# Patient Record
Sex: Male | Born: 1955
Health system: Southern US, Community
[De-identification: ages and names within clinical notes are randomized; demographics above are authoritative.]

## PROBLEM LIST (undated history)

## (undated) DIAGNOSIS — F32A Depression, unspecified: Secondary | ICD-10-CM

## (undated) DIAGNOSIS — F329 Major depressive disorder, single episode, unspecified: Secondary | ICD-10-CM

## (undated) DIAGNOSIS — I10 Essential (primary) hypertension: Secondary | ICD-10-CM

## (undated) DIAGNOSIS — F419 Anxiety disorder, unspecified: Secondary | ICD-10-CM

## (undated) DIAGNOSIS — J45909 Unspecified asthma, uncomplicated: Secondary | ICD-10-CM

## (undated) HISTORY — DX: Depression, unspecified: F32.A

## (undated) HISTORY — DX: Major depressive disorder, single episode, unspecified: F32.9

## (undated) HISTORY — DX: Anxiety disorder, unspecified: F41.9

---

## 2008-07-31 ENCOUNTER — Emergency Department (HOSPITAL_COMMUNITY): Admission: EM | Admit: 2008-07-31 | Discharge: 2008-07-31 | Payer: Self-pay | Admitting: Emergency Medicine

## 2010-03-05 ENCOUNTER — Emergency Department (HOSPITAL_BASED_OUTPATIENT_CLINIC_OR_DEPARTMENT_OTHER)
Admission: EM | Admit: 2010-03-05 | Discharge: 2010-03-05 | Payer: Self-pay | Source: Home / Self Care | Admitting: Emergency Medicine

## 2010-03-05 ENCOUNTER — Ambulatory Visit: Payer: Self-pay | Admitting: Diagnostic Radiology

## 2010-03-08 ENCOUNTER — Emergency Department (HOSPITAL_BASED_OUTPATIENT_CLINIC_OR_DEPARTMENT_OTHER)
Admission: EM | Admit: 2010-03-08 | Discharge: 2010-03-08 | Payer: Self-pay | Source: Home / Self Care | Admitting: Emergency Medicine

## 2010-03-08 ENCOUNTER — Ambulatory Visit: Payer: Self-pay | Admitting: Diagnostic Radiology

## 2010-06-21 LAB — URINE MICROSCOPIC-ADD ON

## 2010-06-21 LAB — BASIC METABOLIC PANEL
BUN: 23 mg/dL (ref 6–23)
CO2: 24 mEq/L (ref 19–32)
Chloride: 100 mEq/L (ref 96–112)
Glucose, Bld: 131 mg/dL — ABNORMAL HIGH (ref 70–99)
Potassium: 3.9 mEq/L (ref 3.5–5.1)
Sodium: 137 mEq/L (ref 135–145)

## 2010-06-21 LAB — DIFFERENTIAL
Basophils Absolute: 0 10*3/uL (ref 0.0–0.1)
Basophils Relative: 0 % (ref 0–1)
Eosinophils Relative: 1 % (ref 0–5)
Monocytes Absolute: 0.8 10*3/uL (ref 0.1–1.0)
Monocytes Relative: 7 % (ref 3–12)

## 2010-06-21 LAB — URINE CULTURE
Colony Count: NO GROWTH
Culture  Setup Time: 201111270025

## 2010-06-21 LAB — URINALYSIS, ROUTINE W REFLEX MICROSCOPIC
Bilirubin Urine: NEGATIVE
Bilirubin Urine: NEGATIVE
Glucose, UA: NEGATIVE mg/dL
Ketones, ur: 15 mg/dL — AB
Ketones, ur: 15 mg/dL — AB
Leukocytes, UA: NEGATIVE
Nitrite: NEGATIVE
Protein, ur: NEGATIVE mg/dL
Urobilinogen, UA: 0.2 mg/dL (ref 0.0–1.0)
pH: 5.5 (ref 5.0–8.0)

## 2010-06-21 LAB — CBC
HCT: 44.3 % (ref 39.0–52.0)
Hemoglobin: 15.2 g/dL (ref 13.0–17.0)
MCH: 30.6 pg (ref 26.0–34.0)
MCHC: 34.4 g/dL (ref 30.0–36.0)
MCV: 89.1 fL (ref 78.0–100.0)
RDW: 13 % (ref 11.5–15.5)

## 2013-02-13 ENCOUNTER — Ambulatory Visit: Payer: Self-pay | Admitting: Family Medicine

## 2013-02-13 VITALS — BP 130/78 | HR 80 | Temp 98.1°F | Resp 16 | Ht 68.0 in | Wt 177.6 lb

## 2013-02-13 DIAGNOSIS — F3289 Other specified depressive episodes: Secondary | ICD-10-CM

## 2013-02-13 DIAGNOSIS — F329 Major depressive disorder, single episode, unspecified: Secondary | ICD-10-CM

## 2013-02-13 DIAGNOSIS — F32A Depression, unspecified: Secondary | ICD-10-CM

## 2013-02-13 DIAGNOSIS — G4700 Insomnia, unspecified: Secondary | ICD-10-CM

## 2013-02-13 MED ORDER — PAROXETINE HCL 20 MG PO TABS: 20.0000 mg | ORAL_TABLET | Freq: Every day | ORAL | Status: DC

## 2013-02-13 MED ORDER — CLONAZEPAM 1 MG PO TABS: ORAL_TABLET | ORAL | Status: DC
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
## 2013-02-27 ENCOUNTER — Ambulatory Visit: Payer: Self-pay | Admitting: Family Medicine

## 2013-02-27 VITALS — BP 122/78 | HR 82 | Temp 98.5°F | Resp 18 | Ht 67.0 in | Wt 177.2 lb

## 2013-02-27 DIAGNOSIS — D1739 Benign lipomatous neoplasm of skin and subcutaneous tissue of other sites: Secondary | ICD-10-CM

## 2013-02-27 DIAGNOSIS — R221 Localized swelling, mass and lump, neck: Secondary | ICD-10-CM

## 2013-02-27 DIAGNOSIS — F411 Generalized anxiety disorder: Secondary | ICD-10-CM

## 2013-02-27 DIAGNOSIS — D170 Benign lipomatous neoplasm of skin and subcutaneous tissue of head, face and neck: Secondary | ICD-10-CM

## 2013-02-27 DIAGNOSIS — R220 Localized swelling, mass and lump, head: Secondary | ICD-10-CM

## 2013-02-27 NOTE — Patient Instructions (Signed)

## 2013-02-27 NOTE — Progress Notes (Signed)
[address]   [place], [place]  [address]   [phone] Subjective:    Patient ID: Casey Silva, male    DOB: 1955-12-07, 57 y.o.   MRN: [id number]  This chart was scribed for Ethelda Chick, MD by Blanchard Kelch, ED Scribe. The patient was seen in room 4. Patient's care was started at 4:11 PM.   HPI  Casey Silva is a 57 y.o. male who presents to office for a physical exam. He would like to donate plasma but needs his neck examined in order to be able to donate. He has a raised mass on his neck that he states he has had for at least twenty years. He denies that it has enlarged since first noticing it. He states that his mother has a similar cyst on her neck. He denies chest pain, nausea, vomiting, heartburn, suicidal ideations, or homicidal ideations. He has a regular bowel movement everyday.  He denies any chronic health problems other than insomnia. He is currently taking Klonopin and Paxil for his insomnia and mild anxiety. He states that caffeine accelerates his mood so he tries to stay away from it. He specifically denies a past medical history of hypertension, diabetes or hyperlipidemia.   He had asthma as a child but that has subsided. He has had knee surgery when he was a child. He denies any over night hospitalizations. He has an allergy to pollen but denies any allergic reactions to medication.   His mother is 95 and she has a past medical family and extended family has hearing problems. He denies she has heart attack, stroke , diabetes, or cancer. She used to be a smoker. His father passed away due GI problems that led to a colostomy being placed. He caught an infection in the [place] and passed away in 1980/08/24. He had no other health problems. He has two sisters. His older sister is healthy other than being obese. His younger sister is healthy also. He does not have any brothers.   He has been a smoker since he was about 18. He smokes 1-2 cigarettes a day. He hasn't drank  alcohol in almost a year. He denies drug use. He exercises at least once a day. He is currently single. He does not have any children. He was working with Valorie Roosevelt and Elsie Lincoln at [employer] but is currently in between jobs as of last week. He is looking for a new job.   He does not have a PCP currently because he recently moved. He is originally from [place]. He became a [place] citizen in 1970s.  Past Medical History  Diagnosis Date  . Depression   . Anxiety    History reviewed. No pertinent past surgical history. Family History  Problem Relation Age of Onset  . Bipolar disorder Mother    No Known Allergies Current Outpatient Prescriptions on File Prior to Visit  Medication Sig Dispense Refill  . clonazePAM (KLONOPIN) 1 MG tablet May take 1-2 tabs PO nightly prn for insomnia.  60 tablet  2  . PARoxetine (PAXIL) 20 MG tablet Take 1 tablet (20 mg total) by mouth daily.  30 tablet  2   No current facility-administered medications on file prior to visit.   History   Social History  . Marital Status: Single    Spouse Name: N/A    Number of Children: N/A  . Years of Education: N/A   Occupational History  . Not on file.   Social History Main Topics  .  Smoking status: Current Every Day Smoker -- 40 years  . Smokeless tobacco: Not on file  . Alcohol Use: No  . Drug Use: No  . Sexual Activity: Not on file   Other Topics Concern  . Not on file   Social History Narrative   Marital status:  Single      Children: none      Employment:  Unemployment.  Independently wealthy; previous work for [employer].      Tobacco:  1-2 cigarettes per day since age 44.      Alcohol:  None      Drugs:  none      Exercise:  Regularly very      Religion:  Jewish   Family History  Problem Relation Age of Onset  . Bipolar disorder Mother     Review of Systems  Constitutional: Negative for fever.  HENT: Negative for drooling.   Eyes: Negative for discharge.  Respiratory: Negative for  cough.   Cardiovascular: Negative for chest pain and leg swelling.  Gastrointestinal: Negative for vomiting.  Endocrine: Negative for polyuria.  Genitourinary: Negative for hematuria.  Musculoskeletal: Negative for gait problem.  Skin: Negative for rash and wound.  Allergic/Immunologic: Negative for immunocompromised state.  Neurological: Negative for speech difficulty.  Hematological: Negative for adenopathy.  Psychiatric/Behavioral: Positive for sleep disturbance. Negative for suicidal ideas, confusion, self-injury and dysphoric mood. The patient is nervous/anxious.        Objective:   Physical Exam  Nursing note and vitals reviewed. Constitutional: He is oriented to person, place, and time. He appears well-developed and well-nourished.  HENT:  Head: Normocephalic and atraumatic.  Eyes: EOM are normal. No scleral icterus.  Neck: Normal range of motion. No thyromegaly present.  Full range of motion of cervical spine. Left lateral neck has a 2.5 cm by 3cm lipomatous mass that is non tender and moveable.  Cardiovascular: Normal rate and regular rhythm.  Exam reveals no gallop and no friction rub.   No murmur heard. Pulmonary/Chest: Effort normal and breath sounds normal. No respiratory distress. He has no wheezes. He has no rhonchi. He has no rales.  Abdominal: Soft. Bowel sounds are normal.  Musculoskeletal: Normal range of motion.  Full range of motion of lumbar spine and shoulders bilaterally.   Lymphadenopathy:    He has no cervical adenopathy.  Neurological: He is alert and oriented to person, place, and time.  Skin: Skin is warm and dry.       Assessment & Plan:  Mass of left side of neck  Lipoma of neck  Generalized anxiety disorder  1.  Mass of L side of neck: New. Consistent with lipoma.   2.  Lipoma L side of neck:  New to this provider.  Present for years without enlargement or growth.  Reassurance provided to patient; clearance to donate plasma.  Discussed that  only indication for resection would be frequent pain, rapid growth of lipoma.   3. Anxiety: stable on Paxil and Klonopin therapy.    No orders of the defined types were placed in this encounter.   I personally performed the services described in this documentation, which was scribed in my presence.  The recorded information has been reviewed and is accurate.  Nilda Simmer, M.D.  [facility] [address] [place], [place]  [address] [phone] phone [phone] fax

## 2013-05-19 ENCOUNTER — Other Ambulatory Visit: Payer: Self-pay | Admitting: Family Medicine

## 2013-05-19 NOTE — Telephone Encounter (Signed)
Approved Paxil RF for 1 mos w/note needs to go or est PCP for add'l RFs. Dr Marin Comment, do you want to RF clonazepam?

## 2013-05-20 NOTE — Telephone Encounter (Signed)
Called in.

## 2015-05-28 DIAGNOSIS — D1730 Benign lipomatous neoplasm of skin and subcutaneous tissue of unspecified sites: Secondary | ICD-10-CM | POA: Insufficient documentation

## 2015-05-28 DIAGNOSIS — J45909 Unspecified asthma, uncomplicated: Secondary | ICD-10-CM | POA: Insufficient documentation

## 2015-05-28 DIAGNOSIS — J439 Emphysema, unspecified: Secondary | ICD-10-CM | POA: Insufficient documentation

## 2015-05-28 DIAGNOSIS — M542 Cervicalgia: Secondary | ICD-10-CM | POA: Insufficient documentation

## 2015-05-28 DIAGNOSIS — M62838 Other muscle spasm: Secondary | ICD-10-CM | POA: Insufficient documentation

## 2015-05-28 DIAGNOSIS — J302 Other seasonal allergic rhinitis: Secondary | ICD-10-CM | POA: Insufficient documentation

## 2015-05-28 DIAGNOSIS — Z720 Tobacco use: Secondary | ICD-10-CM | POA: Diagnosis present

## 2015-05-28 DIAGNOSIS — F0781 Postconcussional syndrome: Secondary | ICD-10-CM | POA: Insufficient documentation

## 2016-02-21 DIAGNOSIS — Z833 Family history of diabetes mellitus: Secondary | ICD-10-CM | POA: Insufficient documentation

## 2016-02-21 DIAGNOSIS — I10 Essential (primary) hypertension: Secondary | ICD-10-CM | POA: Insufficient documentation

## 2019-09-23 ENCOUNTER — Other Ambulatory Visit: Payer: Self-pay

## 2019-09-23 ENCOUNTER — Emergency Department (HOSPITAL_COMMUNITY)
Admission: EM | Admit: 2019-09-23 | Discharge: 2019-09-24 | Disposition: A | Discharge status: Home / Self Care | Payer: Self-pay | Attending: Emergency Medicine | Admitting: Emergency Medicine

## 2019-09-23 ENCOUNTER — Encounter (HOSPITAL_COMMUNITY): Payer: Self-pay | Admitting: Emergency Medicine

## 2019-09-23 DIAGNOSIS — R45851 Suicidal ideations: Secondary | ICD-10-CM

## 2019-09-23 DIAGNOSIS — F17200 Nicotine dependence, unspecified, uncomplicated: Secondary | ICD-10-CM | POA: Insufficient documentation

## 2019-09-23 DIAGNOSIS — Z20822 Contact with and (suspected) exposure to covid-19: Secondary | ICD-10-CM | POA: Insufficient documentation

## 2019-09-23 DIAGNOSIS — F1290 Cannabis use, unspecified, uncomplicated: Secondary | ICD-10-CM | POA: Insufficient documentation

## 2019-09-23 DIAGNOSIS — R4581 Low self-esteem: Secondary | ICD-10-CM | POA: Insufficient documentation

## 2019-09-23 LAB — COMPREHENSIVE METABOLIC PANEL
ALT: 19 U/L (ref 0–44)
AST: 21 U/L (ref 15–41)
Albumin: 4.6 g/dL (ref 3.5–5.0)
Alkaline Phosphatase: 54 U/L (ref 38–126)
Anion gap: 15 (ref 5–15)
BUN: 14 mg/dL (ref 8–23)
CO2: 22 mmol/L (ref 22–32)
Calcium: 9.2 mg/dL (ref 8.9–10.3)
Chloride: 98 mmol/L (ref 98–111)
Creatinine, Ser: 0.85 mg/dL (ref 0.61–1.24)
GFR calc Af Amer: >60 mL/min (ref >60)
GFR calc non Af Amer: >60 mL/min (ref >60)
Glucose, Bld: 97 mg/dL (ref 70–99)
Potassium: 4.1 mmol/L (ref 3.5–5.1)
Sodium: 135 mmol/L (ref 135–145)
Total Bilirubin: 1.1 mg/dL (ref 0.3–1.2)
Total Protein: 7.9 g/dL (ref 6.5–8.1)

## 2019-09-23 LAB — CBC WITH DIFFERENTIAL/PLATELET
Abs Immature Granulocytes: 0.03 10*3/uL (ref 0.00–0.07)
Basophils Absolute: 0.1 10*3/uL (ref 0.0–0.1)
Basophils Relative: 1 %
Eosinophils Absolute: 0.2 10*3/uL (ref 0.0–0.5)
Eosinophils Relative: 2 %
HCT: 44.3 % (ref 39.0–52.0)
Hemoglobin: 15 g/dL (ref 13.0–17.0)
Immature Granulocytes: 0 %
Lymphocytes Relative: 21 %
Lymphs Abs: 1.6 10*3/uL (ref 0.7–4.0)
MCH: 29.6 pg (ref 26.0–34.0)
MCHC: 33.9 g/dL (ref 30.0–36.0)
MCV: 87.4 fL (ref 80.0–100.0)
Monocytes Absolute: 0.5 10*3/uL (ref 0.1–1.0)
Monocytes Relative: 7 %
Neutro Abs: 5.1 10*3/uL (ref 1.7–7.7)
Neutrophils Relative %: 69 %
Platelets: 335 10*3/uL (ref 150–400)
RBC: 5.07 MIL/uL (ref 4.22–5.81)
RDW: 13.2 % (ref 11.5–15.5)
WBC: 7.4 10*3/uL (ref 4.0–10.5)
nRBC: 0 % (ref 0.0–0.2)

## 2019-09-23 LAB — RAPID URINE DRUG SCREEN, HOSP PERFORMED
Amphetamines: NOT DETECTED
Barbiturates: NOT DETECTED
Benzodiazepines: NOT DETECTED
Cocaine: NOT DETECTED
Opiates: NOT DETECTED
Tetrahydrocannabinol: NOT DETECTED

## 2019-09-23 LAB — ACETAMINOPHEN LEVEL: Acetaminophen (Tylenol), Serum: <10 ug/mL — ABNORMAL LOW (ref 10–30)

## 2019-09-23 LAB — SARS CORONAVIRUS 2 BY RT PCR (HOSPITAL ORDER, PERFORMED IN ~~LOC~~ HOSPITAL LAB): SARS Coronavirus 2: NEGATIVE

## 2019-09-23 LAB — ETHANOL: Alcohol, Ethyl (B): <10 mg/dL (ref <10)

## 2019-09-23 LAB — SALICYLATE LEVEL: Salicylate Lvl: <7 mg/dL — ABNORMAL LOW (ref 7.0–30.0)

## 2019-09-23 NOTE — ED Provider Notes (Signed)
Patient received at signout from [facility].  Please see her note for full HPI.  In short, 64 year old male with a history of anxiety and depression presents from PCPs office for suicidal ideations.  Patient had a plan to overdose on a bottle of hydrocodone pills at home.  He had presented to his PCP and expressed his sentiments, and was thus sent to the ER.  He is actively seeking help and arrives voluntarily.  He endorses overall body pain, but cannot identify specific pain.  He also has some palpitations with anxiety.  Signout received with labs pending and TTS consult pending.  I personally reviewed all of his labs.  CBC without leukocytosis, negative ethanol, salicylate, acetaminophen.  UDS negative.  Negative Covid.  CMP without significant electrode abnormalities, normal BUN/creatinine, normal AST/ALT.  EKG normal sinus rhythm.  Overall, the patient has no other physical complaints.  He has been medically cleared, awaiting TTS consult.   Physical Exam  BP (!) 149/105 (BP Location: Right Arm)   Pulse 71   Temp 98.6 F (37 C) (Oral)   Resp 18   Ht 5\' 7"  (1.702 m)   Wt 80 kg   SpO2 98%   BMI 27.62 kg/m   Physical Exam Vitals and nursing note reviewed.  Constitutional:      Appearance: He is well-developed.  HENT:     Head: Normocephalic and atraumatic.  Eyes:     Conjunctiva/sclera: Conjunctivae normal.  Cardiovascular:     Rate and Rhythm: Normal rate and regular rhythm.     Pulses: Normal pulses.     Heart sounds: Normal heart sounds. No murmur heard.   Pulmonary:     Effort: Pulmonary effort is normal. No respiratory distress.     Breath sounds: Normal breath sounds.  Abdominal:     Palpations: Abdomen is soft.     Tenderness: There is no abdominal tenderness.  Musculoskeletal:        General: No swelling. Normal range of motion.     Cervical back: Normal range of motion and neck supple.  Skin:    General: Skin is warm and dry.     Capillary Refill: Capillary refill  takes less than 2 seconds.     Findings: No erythema or rash.  Neurological:     General: No focal deficit present.     Mental Status: He is alert and oriented to person, place, and time.  Psychiatric:        Attention and Perception: Attention and perception normal.        Mood and Affect: Mood is depressed.        Speech: Speech normal.        Behavior: Behavior normal.        Thought Content: Thought content includes suicidal ideation.     ED Course/Procedures     Results for orders placed or performed during the hospital encounter of 09/23/19  SARS Coronavirus 2 by RT PCR (hospital order, performed in [facility] hospital lab) Nasopharyngeal Nasopharyngeal Swab   Specimen: Nasopharyngeal Swab  Result Value Ref Range   SARS Coronavirus 2 NEGATIVE NEGATIVE  Comprehensive metabolic panel  Result Value Ref Range   Sodium 135 135 - 145 mmol/L   Potassium 4.1 3.5 - 5.1 mmol/L   Chloride 98 98 - 111 mmol/L   CO2 22 22 - 32 mmol/L   Glucose, Bld 97 70 - 99 mg/dL   BUN 14 8 - 23 mg/dL   Creatinine, Ser 0.85 0.61 -  1.24 mg/dL   Calcium 9.2 8.9 - 10.3 mg/dL   Total Protein 7.9 6.5 - 8.1 g/dL   Albumin 4.6 3.5 - 5.0 g/dL   AST 21 15 - 41 U/L   ALT 19 0 - 44 U/L   Alkaline Phosphatase 54 38 - 126 U/L   Total Bilirubin 1.1 0.3 - 1.2 mg/dL   GFR calc non Af Amer >60 >60 mL/min   GFR calc Af Amer >60 >60 mL/min   Anion gap 15 5 - 15  Ethanol  Result Value Ref Range   Alcohol, Ethyl (B) <10 <10 mg/dL  Urine rapid drug screen (hosp performed)  Result Value Ref Range   Opiates NONE DETECTED NONE DETECTED   Cocaine NONE DETECTED NONE DETECTED   Benzodiazepines NONE DETECTED NONE DETECTED   Amphetamines NONE DETECTED NONE DETECTED   Tetrahydrocannabinol NONE DETECTED NONE DETECTED   Barbiturates NONE DETECTED NONE DETECTED  CBC with Diff  Result Value Ref Range   WBC 7.4 4.0 - 10.5 K/uL   RBC 5.07 4.22 - 5.81 MIL/uL   Hemoglobin 15.0 13.0 - 17.0 g/dL   HCT 44.3 39 - 52 %   MCV  87.4 80.0 - 100.0 fL   MCH 29.6 26.0 - 34.0 pg   MCHC 33.9 30.0 - 36.0 g/dL   RDW 13.2 11.5 - 15.5 %   Platelets 335 150 - 400 K/uL   nRBC 0.0 0.0 - 0.2 %   Neutrophils Relative % 69 %   Neutro Abs 5.1 1.7 - 7.7 K/uL   Lymphocytes Relative 21 %   Lymphs Abs 1.6 0.7 - 4.0 K/uL   Monocytes Relative 7 %   Monocytes Absolute 0.5 0 - 1 K/uL   Eosinophils Relative 2 %   Eosinophils Absolute 0.2 0 - 0 K/uL   Basophils Relative 1 %   Basophils Absolute 0.1 0 - 0 K/uL   Immature Granulocytes 0 %   Abs Immature Granulocytes 0.03 0.00 - 0.07 K/uL  Acetaminophen level  Result Value Ref Range   Acetaminophen (Tylenol), Serum <10 (L) 10 - 30 ug/mL  Salicylate level  Result Value Ref Range   Salicylate Lvl <3.8 (L) 7.0 - 30.0 mg/dL   No results found.  Procedures  MDM         Garald Balding, PA-C 09/23/19 2057    Little, Wenda Overland, MD 09/24/19 1039

## 2019-09-23 NOTE — ED Triage Notes (Signed)
Pt brought in by [employer] from PCP office, wants to be medically evaluated and also endorses SI thoughts. States he has felt depressed for several years but it has been getting worse. States he has a bottle of prescription hydrocodone pills and has contemplated taking them. Denies HI thoughts.

## 2019-09-23 NOTE — ED Provider Notes (Signed)
[facility] DEPT Provider Note   CSN: [id number] Arrival date & time: 09/23/19  1615     History Chief Complaint  Patient presents with  . Suicidal    Kavir Savoca is a 64 y.o. male.  Cameron Schwinn is a 64 y.o. male with a history of anxiety and depression, who presents to the ED via [facility] from his PCPs office for evaluation of SI.  Patient reports that he has been struggling with anxiety and depression for many years, for a long time did not seek any medical help and was not on any medication for this.  Recently started seeing a PCP who started him on hydroxyzine.  He went back to his PCP today because his most recent episode of depression has been worsening recently.  He reports having thoughts of suicide, reports he has a bottle of hydrocodone pills at home and he has contemplated taking them in an attempt to kill himself.  He reports that he has constant fears of doing things.  Has had to stop multiple jobs due to worsening depression and anxiety.  He reports having some palpitations intermittently and feeling like his body is just going to shut down to give up on him.  Patient reports that his anxiety often impacts his sleep and he has not been able to get much sleep over the last few days.  He reports that at this point he just wants it all to end and he does not want to feel pain anymore.  He denies specific medical complaints today, but was hoping that when he saw his PCP today that they would send him to the hospital.  He denies any fevers or recent illness.  No other aggravating or alleviating factors.        Past Medical History:  Diagnosis Date  . Anxiety   . Depression     There are no problems to display for this patient.   History reviewed. No pertinent surgical history.     Family History  Problem Relation Age of Onset  . Bipolar disorder Mother     Social History   Tobacco Use  . Smoking status: Current Some Day Smoker    Years:  40.00  . Smokeless tobacco: Never Used  Substance Use Topics  . Alcohol use: No  . Drug use: No    Home Medications Prior to Admission medications   Medication Sig Start Date End Date Taking? Authorizing Provider  albuterol (VENTOLIN HFA) 108 (90 Base) MCG/ACT inhaler Inhale 2 puffs into the lungs every 6 (six) hours as needed for wheezing or shortness of breath.  04/18/19  Yes [provider]  IBU 400 MG tablet Take 400-800 mg by mouth 3 (three) times daily as needed for moderate pain.  08/04/19  Yes [provider]  Misc Natural Products (NF FORMULAS TESTOSTERONE PO) Take 1 tablet by mouth daily.   Yes [provider]  Multiple Vitamin (MULTIVITAMIN ADULT) TABS Take 1 tablet by mouth daily.   Yes [provider]  clonazePAM (KLONOPIN) 1 MG tablet Take 1-2 tablets by mouth nightly as needed. PATIENT NEEDS GO TO OR EST PCP FOR ADDITIONAL REFILLS Patient not taking: Reported on 09/23/2019 05/19/13   Le, Thao P, DO  PARoxetine (PAXIL) 20 MG tablet Take 1 tablet (20 mg total) by mouth daily. PATIENT NEEDS GO TO OR EST PCP FOR ADDITIONAL REFILLS Patient not taking: Reported on 09/23/2019 05/19/13   Rikki Spearing P, DO    Allergies  Dust mite mixed allergen ext [mite (d. farinae)] and Bee pollen  Review of Systems   Review of Systems  Constitutional: Negative for chills and fever.  HENT: Negative.   Respiratory: Negative for cough and shortness of breath.   Cardiovascular: Positive for palpitations. Negative for chest pain.  Gastrointestinal: Negative for abdominal pain, nausea and vomiting.  Genitourinary: Negative for dysuria.  Musculoskeletal: Negative for arthralgias and myalgias.  Neurological: Negative for dizziness, syncope and light-headedness.  Psychiatric/Behavioral: Positive for dysphoric mood, sleep disturbance and suicidal ideas.    Physical Exam Updated Vital Signs BP (!) 149/105 (BP Location: Right Arm)   Pulse 71   Temp 98.6 F (37 C) (Oral)    Resp 18   Ht 5\' 7"  (1.702 m)   Wt 80 kg   SpO2 98%   BMI 27.62 kg/m   Physical Exam Vitals and nursing note reviewed.  Constitutional:      General: He is not in acute distress.    Appearance: Normal appearance. He is well-developed. He is not ill-appearing or diaphoretic.  HENT:     Head: Normocephalic and atraumatic.  Eyes:     General:        Right eye: No discharge.        Left eye: No discharge.  Cardiovascular:     Rate and Rhythm: Normal rate and regular rhythm.     Heart sounds: Normal heart sounds. No murmur heard.  No friction rub. No gallop.   Pulmonary:     Effort: Pulmonary effort is normal. No respiratory distress.     Breath sounds: Normal breath sounds. No wheezing or rales.     Comments: Respirations equal and unlabored, patient able to speak in full sentences, lungs clear to auscultation bilaterally Abdominal:     General: Bowel sounds are normal. There is no distension.     Palpations: Abdomen is soft. There is no mass.     Tenderness: There is no abdominal tenderness. There is no guarding.     Comments: Abdomen soft, nondistended, nontender to palpation in all quadrants without guarding or peritoneal signs  Musculoskeletal:        General: No deformity.     Cervical back: Neck supple.  Skin:    General: Skin is warm and dry.     Capillary Refill: Capillary refill takes less than 2 seconds.  Neurological:     Mental Status: He is alert.     Coordination: Coordination normal.     Comments: Speech is clear, able to follow commands Moves extremities without ataxia, coordination intact  Psychiatric:        Attention and Perception: He does not perceive auditory or visual hallucinations.        Mood and Affect: Mood is depressed.        Speech: Speech normal.        Behavior: Behavior normal. Behavior is cooperative.        Thought Content: Thought content includes suicidal ideation. Thought content does not include homicidal ideation. Thought content  includes suicidal plan.     ED Results / Procedures / Treatments   Labs (all labs ordered are listed, but only abnormal results are displayed) Labs Reviewed  SARS CORONAVIRUS 2 BY RT PCR (HOSPITAL ORDER, [facility])  COMPREHENSIVE METABOLIC PANEL  CBC WITH DIFFERENTIAL/PLATELET  ETHANOL  RAPID URINE DRUG SCREEN, HOSP PERFORMED  ACETAMINOPHEN LEVEL  SALICYLATE LEVEL    EKG None  Radiology No results found.  Procedures Procedures (  including critical care time)  Medications Ordered in ED Medications - No data to display  ED Course  I have reviewed the triage vital signs and the nursing notes.  Pertinent labs & imaging results that were available during my care of the patient were reviewed by me and considered in my medical decision making (see chart for details).    MDM Rules/Calculators/A&P                          64 year old male sent from PCPs office for suicidal ideations, has dealt with depression for many years, has not had previous psychiatric hospitalization.  Arrives today with plan to take a bottle of hydrocodone that he has at home to kill himself.  He reports that he has given up and is just tired of the pain at this time.  He denies focal medical complaint, no recent fevers or illness, he reports some intermittent palpitations associated with his anxiety, currently denies this, no chest pain, shortness of breath or abdominal pain.  Will get screening labs and EKG in place TTS consult.  At shift change care signed out to Porter who will follow up on patient's lab work, anticipate he will be medically cleared, and will follow up on TTS recommendations.  Final Clinical Impression(s) / ED Diagnoses Final diagnoses:  Suicidal ideation    Rx / DC Orders ED Discharge Orders    None       Jacqlyn Larsen, Hershal Coria 09/23/19 [place], [place], DO 09/24/19 0125

## 2019-09-24 ENCOUNTER — Ambulatory Visit (HOSPITAL_COMMUNITY)
Admit: 2019-09-24 | Discharge: 2019-09-24 | Disposition: A | Discharge status: Home / Self Care | Payer: Self-pay | Attending: Psychiatry | Admitting: Psychiatry

## 2019-09-24 ENCOUNTER — Inpatient Hospital Stay (HOSPITAL_COMMUNITY)
Admission: EM | Admit: 2019-09-24 | Discharge: 2019-09-26 | DRG: 885 | Disposition: A | Discharge status: Home / Self Care | Payer: Self-pay | Source: Intra-hospital | Attending: Psychiatry | Admitting: Psychiatry

## 2019-09-24 ENCOUNTER — Encounter (HOSPITAL_COMMUNITY): Payer: Self-pay | Admitting: Psychiatry

## 2019-09-24 DIAGNOSIS — S0990XA Unspecified injury of head, initial encounter: Secondary | ICD-10-CM | POA: Insufficient documentation

## 2019-09-24 DIAGNOSIS — R440 Auditory hallucinations: Secondary | ICD-10-CM | POA: Insufficient documentation

## 2019-09-24 DIAGNOSIS — F329 Major depressive disorder, single episode, unspecified: Secondary | ICD-10-CM | POA: Diagnosis present

## 2019-09-24 DIAGNOSIS — F419 Anxiety disorder, unspecified: Secondary | ICD-10-CM | POA: Diagnosis present

## 2019-09-24 DIAGNOSIS — Z20822 Contact with and (suspected) exposure to covid-19: Secondary | ICD-10-CM | POA: Diagnosis present

## 2019-09-24 DIAGNOSIS — R45851 Suicidal ideations: Secondary | ICD-10-CM | POA: Diagnosis present

## 2019-09-24 DIAGNOSIS — F29 Unspecified psychosis not due to a substance or known physiological condition: Secondary | ICD-10-CM | POA: Diagnosis present

## 2019-09-24 DIAGNOSIS — Z818 Family history of other mental and behavioral disorders: Secondary | ICD-10-CM

## 2019-09-24 DIAGNOSIS — R441 Visual hallucinations: Secondary | ICD-10-CM | POA: Insufficient documentation

## 2019-09-24 DIAGNOSIS — Z91030 Bee allergy status: Secondary | ICD-10-CM

## 2019-09-24 DIAGNOSIS — Z91048 Other nonmedicinal substance allergy status: Secondary | ICD-10-CM

## 2019-09-24 DIAGNOSIS — F17210 Nicotine dependence, cigarettes, uncomplicated: Secondary | ICD-10-CM | POA: Diagnosis present

## 2019-09-24 DIAGNOSIS — F23 Brief psychotic disorder: Principal | ICD-10-CM

## 2019-09-24 DIAGNOSIS — W19XXXA Unspecified fall, initial encounter: Secondary | ICD-10-CM | POA: Insufficient documentation

## 2019-09-24 DIAGNOSIS — G4700 Insomnia, unspecified: Secondary | ICD-10-CM | POA: Diagnosis present

## 2019-09-24 LAB — TSH: TSH: 1.267 u[IU]/mL (ref 0.350–4.500)

## 2019-09-24 MED ORDER — ALBUTEROL SULFATE HFA 108 (90 BASE) MCG/ACT IN AERS: 1.0000 | INHALATION_SPRAY | Freq: Four times a day (QID) | RESPIRATORY_TRACT | Status: DC | PRN

## 2019-09-24 MED ORDER — ALUM & MAG HYDROXIDE-SIMETH 200-200-20 MG/5ML PO SUSP: 30.0000 mL | ORAL | Status: DC | PRN

## 2019-09-24 MED ORDER — LISINOPRIL 10 MG PO TABS
10.0000 mg | ORAL_TABLET | Freq: Every day | ORAL | Status: DC
  Administered 2019-09-24 – 2019-09-25 (×2): 10 mg via ORAL
  Filled 2019-09-24: qty 2
  Filled 2019-09-24: qty 7
  Filled 2019-09-24: qty 1
  Filled 2019-09-24: qty 2
  Filled 2019-09-24 (×3): qty 1

## 2019-09-24 MED ORDER — TRAZODONE HCL 50 MG PO TABS
50.0000 mg | ORAL_TABLET | Freq: Every evening | ORAL | Status: DC | PRN
  Administered 2019-09-24 – 2019-09-25 (×2): 50 mg via ORAL
  Filled 2019-09-24: qty 1
  Filled 2019-09-24: qty 7
  Filled 2019-09-24: qty 1

## 2019-09-24 MED ORDER — ASPIRIN EC 81 MG PO TBEC
81.0000 mg | DELAYED_RELEASE_TABLET | Freq: Every day | ORAL | Status: DC
  Administered 2019-09-24 – 2019-09-26 (×3): 81 mg via ORAL
  Filled 2019-09-24: qty 7
  Filled 2019-09-24 (×6): qty 1

## 2019-09-24 MED ORDER — MAGNESIUM HYDROXIDE 400 MG/5ML PO SUSP: 30.0000 mL | Freq: Every day | ORAL | Status: DC | PRN

## 2019-09-24 MED ORDER — RISPERIDONE 2 MG PO TABS
2.0000 mg | ORAL_TABLET | Freq: Every day | ORAL | Status: DC
  Administered 2019-09-24 – 2019-09-25 (×2): 2 mg via ORAL
  Filled 2019-09-24 (×3): qty 1

## 2019-09-24 MED ORDER — SERTRALINE HCL 25 MG PO TABS
25.0000 mg | ORAL_TABLET | Freq: Every day | ORAL | Status: DC
  Administered 2019-09-24 – 2019-09-26 (×3): 25 mg via ORAL
  Filled 2019-09-24 (×5): qty 1
  Filled 2019-09-24: qty 7

## 2019-09-24 MED ORDER — ENSURE ENLIVE PO LIQD
237.0000 mL | Freq: Two times a day (BID) | ORAL | Status: DC
  Administered 2019-09-24: 237 mL via ORAL

## 2019-09-24 MED ORDER — RISPERIDONE 0.5 MG PO TABS
0.5000 mg | ORAL_TABLET | Freq: Every day | ORAL | Status: DC
  Administered 2019-09-24 – 2019-09-26 (×3): 0.5 mg via ORAL
  Filled 2019-09-24 (×3): qty 1
  Filled 2019-09-24: qty 7
  Filled 2019-09-24 (×2): qty 1

## 2019-09-24 MED ORDER — HYDROXYZINE HCL 25 MG PO TABS: 25.0000 mg | ORAL_TABLET | Freq: Three times a day (TID) | ORAL | Status: DC | PRN

## 2019-09-24 MED ORDER — ACETAMINOPHEN 325 MG PO TABS: 650.0000 mg | ORAL_TABLET | Freq: Four times a day (QID) | ORAL | Status: DC | PRN

## 2019-09-24 NOTE — Progress Notes (Signed)
   09/24/19 0740  Vital Signs  Temp 98.5 F (36.9 C)  Temp Source Oral  Pulse Rate 67  Pulse Rate Source Dinamap  Resp 18  BP (!) 137/93  BP Location Left Arm  BP Method Automatic  Patient Position (if appropriate) Sitting  Oxygen Therapy  SpO2 100 %  O2 Device Room Air  Pain Assessment  Pain Scale 0-10  Pain Score 9  Pain Type Acute pain  Pain Location Head  Pain Descriptors / Indicators Jabbing ("knife like")  Pain Frequency Intermittent  Pain Intervention(s) RN made aware  Height and Weight  Height 5\' 10"  (1.778 m)  Weight 79.8 kg  BSA (Calculated - sq m) 1.99 sq meters  BMI (Calculated) 25.25  Weight in (lb) to have BMI = 25 173.9   Admission note: D:  Patient is a 64 year old  Caucasian male brought in for SI. Patient reported increase anxiety and thoughts to OD on hydrocodone.Patinet stated "I fell like I would be better off dead."  Patient had lived in a "big old boarding house." in [place] but does not want to return there because it's lonely, bedbugs and cockroaches. Patient reports that he was born in [place] and moved to [place] when he as a kid. Patient reported that he was married now divorced.  Patient reports that he has had multiple jobs but quits too soon and is full of regret and guilt. Patient's most current job was at a [employer], but quit because he "...slowly saw himself going down.." Patient reports that he had a "seizure"/ blackout  on 09/20/19 where he fell and does not know how long he was out. Patient reported multiple somatic complaints.Patient stated"..my head feels like something cold wrapped around my head and I blacked out." Patient stated "...my bones are floating over here by my shoulder." and " ... the pain in my head feels like a knife is going through it."  Patient reported feeling regret that he had his teeth pulled out for some dentures. Patient reported feeling "overwhelmed" "guilty" and stated that he thinks he has  "delusions of grandeur" because he "...can get wealthy..I really believe that from the lottery." Patient reported that "Watching TV is sending me messages. Patient contracts for safety.   Patient stated that the only contact he has here was Derryl Harbor from [place] and encouragement provided Routine safety checks conducted every 15 minutes. Patient  Informed to notify staff with any concerns.   R: Safety maintained.

## 2019-09-24 NOTE — ED Notes (Signed)
Telepsych initiated at bedside

## 2019-09-24 NOTE — ED Notes (Addendum)
Safe transport contacted to transport patient

## 2019-09-24 NOTE — Tx Team (Signed)
Initial Treatment Plan 09/24/2019 10:42 AM [name] RPZ:[id number]    PATIENT STRESSORS: Financial difficulties Health problems   PATIENT STRENGTHS: Average or above average intelligence Communication skills   PATIENT IDENTIFIED PROBLEMS: depression  anxiety  Fear of being alone  lonliness  SI             DISCHARGE CRITERIA:  Ability to meet basic life and health needs Adequate post-discharge living arrangements Improved stabilization in mood, thinking, and/or behavior  PRELIMINARY DISCHARGE PLAN: Attend aftercare/continuing care group Outpatient therapy Placement in alternative living arrangements  PATIENT/FAMILY INVOLVEMENT: This treatment plan has been presented to and reviewed with the patient, Casey Silva. The patient  has been given the opportunity to ask questions and make suggestions.  Roxanna Mew, RN 09/24/2019, 10:42 AM

## 2019-09-24 NOTE — BHH Suicide Risk Assessment (Signed)
[facility] Admission Suicide Risk Assessment   Nursing information obtained from:  Patient Demographic factors:  Male, Low socioeconomic status, Living alone, Unemployed Current Mental Status:  Self-harm thoughts, Suicidal ideation indicated by patient Loss Factors:  Decline in physical health, Decrease in vocational status, Financial problems / change in socioeconomic status Historical Factors:  Impulsivity Risk Reduction Factors:  NA  Total Time spent with patient: 45 minutes Principal Problem: <principal problem not specified> Diagnosis:  Active Problems:   Psychosis ([place])  Subjective Data: Patient is seen and examined.  Patient is a 64 year old male with a reported past psychiatric history of anxiety, depression and insomnia who presented to the [facility] emergency department on 09/24/2019 with suicidal ideation.  The patient stated that he has had a multiyear history of problems.  He stated that he has been anxious, impulsive, and having severe insomnia for many years.  He went into gross detail about jobs that he had that were successful and that he would leave abruptly.  He discussed his repeated cycles of insomnia.  He stated that he had been placed on medicines like clonazepam and paroxetine in the past for anxiety and sleep issues.  He never remain on these medications long-term.  He denied any previous psychiatric evaluations, psychiatric hospitalizations or psychiatric treatment from a psychiatrist.  He stated that he had a family history of bipolar disorder.  He stated his mother was bipolar.  During the interview he used several words of jargon to describe his symptoms, and was at times very difficult to get him to put them into his own words.  He stated that most recently that he had had several days of worsening insomnia.  He stated that his insomnia had gotten to the point that he would "hear music playing", and as well see shadows and other figures.  He attributed his  worsening depression and anxiety to his current living situation.  He stated he had been in a boardinghouse for several years, and he admitted that the other cohabitant's were not of the highest quality of people.  He also mentions seeing roaches and ants in the house, and it is unclear whether or not this was true, or whether this may have been related to psychosis.  He has had multiple jobs over the years.  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  He had conflict with one of his coworkers over the coworker wanting a more significant relationship.  He also felt as though that the staff been at home watched inappropriate movies containing violence and offensive language.  He ended up leaving that job on 24 May.  He denied any drugs or alcohol.  He denied any previous treatment for symptoms as this.  He stated this was the first time that he had ever gotten so bad that he heard things or saw things.  He denied any episodes of euphoria or excessive spending.  He did admit to impulsivity and depressive episodes.  Primarily his major issue was anxiety.  He was very somatic during the interview.  I discussed headache pain, tooth problems, "my gastrointestinal system shutting down", decreased appetite.  He was admitted to the hospital for evaluation and stabilization.  Continued Clinical Symptoms:  Alcohol Use Disorder Identification Test Final Score (AUDIT): 0 The "Alcohol Use Disorders Identification Test", Guidelines for Use in Primary Care, Second Edition.  World Pharmacologist [facility]). Score between 0-7:  no or low risk or alcohol related problems. Score between 8-15:  moderate risk of alcohol related problems. Score between 16-19:  high risk of alcohol related problems. Score 20 or above:  warrants further diagnostic evaluation for alcohol dependence and treatment.   CLINICAL FACTORS:   Severe Anxiety and/or Agitation Depression:    Anhedonia Delusional Hopelessness Insomnia Currently Psychotic   Musculoskeletal: Strength & Muscle Tone: within normal limits Gait & Station: normal Patient leans: N/A  Psychiatric Specialty Exam: Physical Exam  Nursing note and vitals reviewed. Constitutional: He is oriented to person, place, and time.  HENT:  Head: Normocephalic and atraumatic.  Respiratory: Effort normal.  GI: Normal appearance.  Neurological: He is alert and oriented to person, place, and time.    Review of Systems  Blood pressure (!) 137/93, pulse 67, temperature 98.5 F (36.9 C), temperature source Oral, resp. rate 18, height 5\' 10"  (1.778 m), weight 79.8 kg, SpO2 100 %.Body mass index is 25.25 kg/m.  General Appearance: Casual  Eye Contact:  Minimal  Speech:  Normal Rate  Volume:  Normal  Mood:  Anxious, Depressed and Dysphoric  Affect:  Congruent  Thought Process:  Coherent and Descriptions of Associations: Intact  Orientation:  Full (Time, Place, and Person)  Thought Content:  Hallucinations: Auditory Visual, Obsessions and Rumination  Suicidal Thoughts:  Yes.  without intent/plan  Homicidal Thoughts:  No  Memory:  Immediate;   Poor Recent;   Poor Remote;   Poor  Judgement:  Impaired  Insight:  Fair  Psychomotor Activity:  Increased  Concentration:  Concentration: Fair and Attention Span: Fair  Recall:  [employer] of Knowledge:  Good  Language:  Good  Akathisia:  Negative  Handed:  Right  AIMS (if indicated):     Assets:  Desire for Improvement Resilience  ADL's:  Intact  Cognition:  WNL  Sleep:         COGNITIVE FEATURES THAT CONTRIBUTE TO RISK:  Polarized thinking    SUICIDE RISK:   Moderate:  Frequent suicidal ideation with limited intensity, and duration, some specificity in terms of plans, no associated intent, good self-control, limited dysphoria/symptomatology, some risk factors present, and identifiable protective factors, including available and accessible social  support.  PLAN OF CARE: Patient is seen and examined.  Patient is a 64 year old male with the above-stated past psychiatric history who presented to the [facility] emergency department with suicidal ideation.  He has symptoms of depression, anxiety, almost obsessive compulsive thinking.  He is very somatic, and mild psychotic symptoms.  He will be admitted to the hospital.  He will be integrated in the milieu.  He will be encouraged to attend groups.  It is unclear what the full diagnosis is.  Clearly he is depressed, clearly he is anxious.  He has components of OCD, but does not fulfill criteria for bipolar disorder.  He has not had any euphoric episodes described.  His psychotic symptoms may be related to his insomnia and perhaps psychotic depression.  We will go on and start him on Risperdal 0.5 mg p.o. daily and 2 mg p.o. nightly for psychosis, mood stability and sleep.  He will also be started on Zoloft 25 mg p.o. daily for depression, anxiety and possible obsessive-compulsive disorder.  I do not think he fulfills criteria extensively for OCD, but he is somatic and may fulfill criteria for somatization disorder.  Review of his admission laboratories revealed essentially normal electrolytes, normal CBC, normal differential.  Acetaminophen was less than 10, salicylate was less than 7.  Blood alcohol was less than  10.  Drug screen was negative.  His EKG showed a normal sinus rhythm with a QTc interval of 422.  TSH was not ordered, but that will be placed now.  He will also have available trazodone 50 mg p.o. nightly as needed insomnia as well as hydroxyzine 25 mg p.o. every 6 hours as needed anxiety.  I certify that inpatient services furnished can reasonably be expected to improve the patient's condition.   Sharma Covert, MD 09/24/2019, 11:16 AM

## 2019-09-24 NOTE — ED Notes (Signed)
Safe transport arrived for patient. Patient escorted out to [facility] by staff.

## 2019-09-24 NOTE — Tx Team (Signed)
Interdisciplinary Treatment and Diagnostic Plan Update  09/24/2019 Time of Session: 9:00am Casey Silva MRN: [id number]  Principal Diagnosis: <principal problem not specified>  Secondary Diagnoses: Active Problems:   Psychosis ([place])   Current Medications:  Current Facility-Administered Medications  Medication Dose Route Frequency Provider Last Rate Last Admin  . acetaminophen (TYLENOL) tablet 650 mg  650 mg Oral Q6H PRN Sharma Covert, MD      . albuterol (VENTOLIN HFA) 108 (90 Base) MCG/ACT inhaler 1-2 puff  1-2 puff Inhalation Q6H PRN Sharma Covert, MD      . alum & mag hydroxide-simeth (MAALOX/MYLANTA) 200-200-20 MG/5ML suspension 30 mL  30 mL Oral Q4H PRN Sharma Covert, MD      . aspirin EC tablet 81 mg  81 mg Oral Daily Sharma Covert, MD      . hydrOXYzine (ATARAX/VISTARIL) tablet 25 mg  25 mg Oral TID PRN Sharma Covert, MD      . lisinopril (ZESTRIL) tablet 10 mg  10 mg Oral Daily Sharma Covert, MD      . magnesium hydroxide (MILK OF MAGNESIA) suspension 30 mL  30 mL Oral Daily PRN Sharma Covert, MD      . traZODone (DESYREL) tablet 50 mg  50 mg Oral QHS PRN Sharma Covert, MD       PTA Medications: Medications Prior to Admission  Medication Sig Dispense Refill Last Dose  . albuterol (VENTOLIN HFA) 108 (90 Base) MCG/ACT inhaler Inhale 2 puffs into the lungs every 6 (six) hours as needed for wheezing or shortness of breath.      . clonazePAM (KLONOPIN) 1 MG tablet Take 1-2 tablets by mouth nightly as needed. PATIENT NEEDS GO TO OR EST PCP FOR ADDITIONAL REFILLS (Patient not taking: Reported on 09/23/2019) 60 tablet 0   . IBU 400 MG tablet Take 400-800 mg by mouth 3 (three) times daily as needed for moderate pain.      . Misc Natural Products (NF FORMULAS TESTOSTERONE PO) Take 1 tablet by mouth daily.     . Multiple Vitamin (MULTIVITAMIN ADULT) TABS Take 1 tablet by mouth daily.     Marland Kitchen PARoxetine (PAXIL) 20 MG tablet Take 1 tablet (20 mg total) by  mouth daily. PATIENT NEEDS GO TO OR EST PCP FOR ADDITIONAL REFILLS (Patient not taking: Reported on 09/23/2019) 30 tablet 0     Patient Stressors:    Patient Strengths:    Treatment Modalities: Medication Management, Group therapy, Case management,  1 to 1 session with clinician, Psychoeducation, Recreational therapy.   Physician Treatment Plan for Primary Diagnosis: <principal problem not specified> Long Term Goal(s):     Short Term Goals:    Medication Management: Evaluate patient's response, side effects, and tolerance of medication regimen.  Therapeutic Interventions: 1 to 1 sessions, Unit Group sessions and Medication administration.  Evaluation of Outcomes: Not Met  Physician Treatment Plan for Secondary Diagnosis: Active Problems:   Psychosis ([place])  Long Term Goal(s):     Short Term Goals:       Medication Management: Evaluate patient's response, side effects, and tolerance of medication regimen.  Therapeutic Interventions: 1 to 1 sessions, Unit Group sessions and Medication administration.  Evaluation of Outcomes: Not Met   RN Treatment Plan for Primary Diagnosis: <principal problem not specified> Long Term Goal(s): Knowledge of disease and therapeutic regimen to maintain health will improve  Short Term Goals: Ability to verbalize feelings will improve and Ability to disclose and discuss suicidal ideas  Medication  Management: RN will administer medications as ordered by provider, will assess and evaluate patient's response and provide education to patient for prescribed medication. RN will report any adverse and/or side effects to prescribing provider.  Therapeutic Interventions: 1 on 1 counseling sessions, Psychoeducation, Medication administration, Evaluate responses to treatment, Monitor vital signs and CBGs as ordered, Perform/monitor CIWA, COWS, AIMS and Fall Risk screenings as ordered, Perform wound care treatments as ordered.  Evaluation of Outcomes: Not  Met   LCSW Treatment Plan for Primary Diagnosis: <principal problem not specified> Long Term Goal(s): Safe transition to appropriate next level of care at discharge, Engage patient in therapeutic group addressing interpersonal concerns.  Short Term Goals: Engage patient in aftercare planning with referrals and resources, Increase social support, Increase emotional regulation, Identify triggers associated with mental health/substance abuse issues and Increase skills for wellness and recovery  Therapeutic Interventions: Assess for all discharge needs, 1 to 1 time with Social worker, Explore available resources and support systems, Assess for adequacy in community support network, Educate family and significant other(s) on suicide prevention, Complete Psychosocial Assessment, Interpersonal group therapy.  Evaluation of Outcomes: Not Met  Progress in Treatment: Attending groups: No. New to unit. Participating in groups: No. Taking medication as prescribed: Yes. Toleration medication: Yes. Family/Significant other contact made: No, will contact:  supports if consents are granted. Patient understands diagnosis: Yes. Discussing patient identified problems/goals with staff: Yes. Medical problems stabilized or resolved: Yes. Denies suicidal/homicidal ideation: No. Passive SI Issues/concerns per patient self-inventory: Yes.  New problem(s) identified: Yes, Describe:  isolation, housing concerns  New Short Term/Long Term Goal(s): medication management for mood stabilization; elimination of SI thoughts; development of comprehensive mental wellness/sobriety plan.  Patient Goals: "Try to normalize my state of mind."  Discharge Plan or Barriers: CSW assessing for appropriate referrals.  Reason for Continuation of Hospitalization: Anxiety Depression Hallucinations Medication stabilization Suicidal ideation  Estimated Length of Stay: 5-7 days  Attendees: Patient: Casey Silva 09/24/2019 10:17  AM  Physician: Dr. Mallie Darting 09/24/2019 10:17 AM  Nursing:  09/24/2019 10:17 AM  RN Care Manager: 09/24/2019 10:17 AM  Social Worker: Stephanie Acre, [place] 09/24/2019 10:17 AM  Recreational Therapist:  09/24/2019 10:17 AM  Other:  09/24/2019 10:17 AM  Other:  09/24/2019 10:17 AM  Other: 09/24/2019 10:17 AM    Scribe for Treatment Team: Joellen [place], [place] 09/24/2019 10:17 AM

## 2019-09-24 NOTE — BH Assessment (Addendum)
Comprehensive Clinical Assessment (CCA) Note  09/24/2019 [name] [id number]  Visit Diagnosis:      ICD-10-CM   1. Suicidal ideation  R45.851   Per EDP, "  Casey Silva is a 64 y.o. male with a history of anxiety and depression, who presents to the ED via [facility] from his PCPs office for evaluation of SI.  Patient reports that he has been struggling with anxiety and depression for many years, for a long time did not seek any medical help and was not on any medication for this.  Recently started seeing a PCP who started him on hydroxyzine.  He went back to his PCP today because his most recent episode of depression has been worsening recently.  He reports having thoughts of suicide, reports he has a bottle of hydrocodone pills at home and he has contemplated taking them in an attempt to kill himself.  He reports that he has constant fears of doing things.  Has had to stop multiple jobs due to worsening depression and anxiety.  He reports having some palpitations intermittently and feeling like his body is just going to shut down to give up on him.  Patient reports that his anxiety often impacts his sleep and he has not been able to get much sleep over the last few days.  He reports that at this point he just wants it all to end and he does not want to feel pain anymore.  He denies specific medical complaints today, but was hoping that when he saw his PCP today that they would send him to the hospital.    During TTS assessment pt endorsed current SI thoughts with plan to overdose. Pt denied HI and SIB but did state he has been experiencing AVH due to not getting any sleep last few days. Pt reports he has dealt with severe depression and anxiety last 1 month or so. Pt also reports poor appetite and poor digestive system. Pt denies any drug use. Pt endorses all symptoms of depression: hopelessness, worthlessness, anxiety, insomnia, isolation, guilt, tearfulness, irritability. Pt reports panic attack this past  weekend due to stress. Pt reports he has been struggling with various medical issues, currently unemployed, financial issues and has no support. Pt currently has no provider and not taking any psych meds, does have PCP and is taking Hydr oxine states he does not see difference with meds. Pt states he wants help wants to seek therapy med/management and inpatient treatment, feels that depression and anxiety is spiraling out of control he is mentally/physically impacted and wants help with poor sleep. Pt was oriented, logical speech, did not present to be responding to internal stimuli or delusional content   Disposition: Adaku, Anike, FNP recommends pt for inpatient treatment. Per [facility] Kim B pt accepted to [facility].   Diagnosis: MDD, single episode, severe          GAD    CCA Screening, Triage and Referral (STR)  Patient Reported Information How did you hear about [place]? Primary Care  Referral name: PCP doctor  Referral phone number: No data recorded  Whom do you see for routine medical problems? Primary Care  Practice/Facility Name: [place]  Practice/Facility Phone Number: No data recorded Name of Contact: No data recorded Contact Number: No data recorded Contact Fax Number: No data recorded Prescriber Name: No data recorded Prescriber Address (if known): No data recorded  What Is the Reason for Your Visit/Call Today? suicidal ideation/depression/anxiety  How Long Has This Been  Causing You Problems? 1-6 months  What Do You Feel Would Help You the Most Today? Therapy;Medication;Other (Comment)   Have You Recently Been in Any Inpatient Treatment (Hospital/Detox/Crisis Center/28-Day Program)? No  Name/Location of Program/Hospital:No data recorded How Long Were You There? No data recorded When Were You Discharged? No data recorded  Have You Ever Received Services From [facility] Before? Yes  Who Do You See at [facility]? medical doctor   Have You  Recently Had Any Thoughts About [facility]? Yes  Are You Planning to Commit Suicide/Harm Yourself At This time? No   Have you Recently Had Thoughts About [facility]? No  Explanation: No data recorded  Have You Used Any Alcohol or Drugs in the Past 24 Hours? No  How Long Ago Did You Use Drugs or Alcohol? No data recorded What Did You Use and How Much? No data recorded  Do You Currently Have a Therapist/Psychiatrist? No  Name of Therapist/Psychiatrist: No data recorded  Have You Been Recently Discharged From Any Office Practice or Programs? No  Explanation of Discharge From Practice/Program: No data recorded    CCA Screening Triage Referral Assessment Type of Contact: Tele-Assessment  Is this Initial or Reassessment? Initial Assessment  Date Telepsych consult ordered in CHL:  09/23/19  Time Telepsych consult ordered in CHL:  No data recorded  Patient Reported Information Reviewed? Yes  Patient Left Without Being Seen? No data recorded Reason for Not Completing Assessment: No data recorded  Collateral Involvement: none   Does Patient Have a [place]? No data recorded Name and Contact of Legal Guardian: No data recorded If Minor and Not Living with Parent(s), Who has Custody? No data recorded Is CPS involved or ever been involved? Never  Is APS involved or ever been involved? Never   Patient Determined To Be At Risk for Harm To Self or Others Based on Review of Patient Reported Information or Presenting Complaint? No  Method: No data recorded Availability of Means: No data recorded Intent: No data recorded Notification Required: No data recorded Additional Information for Danger to Others Potential: No data recorded Additional Comments for Danger to Others Potential: No data recorded Are There Guns or Other Weapons in Your Home? No data recorded Types of Guns/Weapons: No data recorded Are These Weapons Safely Secured?                             No data recorded Who Could Verify You Are Able To Have These Secured: No data recorded Do You Have any Outstanding Charges, Pending Court Dates, Parole/Probation? No data recorded Contacted To Inform of Risk of Harm To Self or Others: No data recorded  [facility]: [facility]   Does Patient Present under Involuntary Commitment? No  IVC Papers Initial File Date: No data recorded  [place] of Residence: [place]   Patient Currently Receiving the Following Services: Not Receiving Services   Determination of Need: No data recorded  Options For Referral: Inpatient Hospitalization     CCA Biopsychosocial  Intake/Chief Complaint:  CCA Intake With Chief Complaint Chief Complaint/Presenting Problem: Suicidal ideation Patient's Currently Reported Symptoms/Problems: anxiety/depression Type of Services Patient Feels Are Needed: counseling/medications  Mental Health Symptoms Depression:  Depression: Difficulty Concentrating, Fatigue, Hopelessness, Increase/decrease in appetite, Change in energy/activity, Worthlessness, Duration of symptoms greater than two weeks, Sleep (too much or little), Tearfulness  Mania:  Mania: None  Anxiety:   Anxiety: Restlessness, Difficulty concentrating, Fatigue,  Sleep, Worrying, Tension  Psychosis:  Psychosis: None  Trauma:  Trauma: None  Obsessions:  Obsessions: None  Compulsions:  Compulsions: None  Inattention:  Inattention: Loses things, Forgetful  Hyperactivity/Impulsivity:  Hyperactivity/Impulsivity: N/A  Oppositional/Defiant Behaviors:  Oppositional/Defiant Behaviors: None  Emotional Irregularity:  Emotional Irregularity: None  Other Mood/Personality Symptoms:      Mental Status Exam Appearance and self-care  Stature:  Stature: Tall  Weight:  Weight: Average weight  Clothing:  Clothing: Casual  Grooming:  Grooming: Normal  Cosmetic use:  Cosmetic Use: Age appropriate  Posture/gait:  Posture/Gait: Normal  Motor  activity:  Motor Activity: Restless  Sensorium  Attention:  Attention: Normal  Concentration:  Concentration: Anxiety interferes  Orientation:  Orientation: Object, Person, Place, Situation, Time, X5  Recall/memory:  Recall/Memory: Normal  Affect and Mood  Affect:  Affect: Appropriate, Depressed, Anxious  Mood:  Mood: Depressed, Anxious, Other (Comment)  Relating  Eye contact:  Eye Contact: Normal  Facial expression:  Facial Expression: Depressed  Attitude toward examiner:  Attitude Toward Examiner: Cooperative  Thought and Language  Speech flow: Speech Flow: Clear and Coherent  Thought content:  Thought Content: Appropriate to Mood and Circumstances (NA)  Preoccupation:  Preoccupations: None  Hallucinations:  Hallucinations: Auditory, Visual  Organization:     Transport planner of Knowledge:  Fund of Knowledge: Average  Intelligence:  Intelligence: Average  Abstraction:  Abstraction: Normal  Judgement:  Judgement: Normal  Reality Testing:  Reality Testing: Adequate  Insight:  Insight: Good  Decision Making:  Decision Making: Normal  Social Functioning  Social Maturity:  Social Maturity: Responsible  Social Judgement:  Social Judgement: Normal  Stress  Stressors:  Stressors: Museum/gallery curator, Illness  Coping Ability:     Skill Deficits:     Supports:        Religion:    Leisure/Recreation:    Exercise/Diet: Exercise/Diet Do You Follow a Special Diet?: No Do You Have Any Trouble Sleeping?: Yes Explanation of Sleeping Difficulties: restlessness/anxiety   CCA Employment/Education  Employment/Work Situation: Employment / Work Situation Employment situation: Unemployed Patient's job has been impacted by current illness: No What is the longest time patient has a held a job?: few years Has patient ever been in the [employer]?: No  Education: Education Is Patient Currently Attending School?: No Last Grade Completed: 12 Did Teacher, adult education From [employer]?:  Yes Did Physicist, medical?: No Did Heritage manager?: No Did You Have An Individualized Education Program (IIEP): No Did You Have Any Difficulty At [employer]?: No Patient's Education Has Been Impacted by Current Illness: No   CCA Family/Childhood History  Family and Relationship History: Family history Marital status: Single Are you sexually active?: No  Childhood History:  Childhood History Does patient have siblings?: Yes Number of Siblings: 2 Description of patient's current relationship with siblings: no contact Did patient suffer any verbal/emotional/physical/sexual abuse as a child?: No Did patient suffer from severe childhood neglect?: No Has patient ever been sexually abused/assaulted/raped as an adolescent or adult?: No Was the patient ever a victim of a crime or a disaster?: No Witnessed domestic violence?: No Has patient been affected by domestic violence as an adult?: No  Child/Adolescent Assessment:     CCA Substance Use  Alcohol/Drug Use: Alcohol / Drug Use History of alcohol / drug use?: No history of alcohol / drug abuse                         ASAM's:  Six  Dimensions of Multidimensional Assessment  Dimension 1:  Acute Intoxication and/or Withdrawal Potential:      Dimension 2:  Biomedical Conditions and Complications:      Dimension 3:  Emotional, Behavioral, or Cognitive Conditions and Complications:     Dimension 4:  Readiness to Change:     Dimension 5:  Relapse, Continued use, or Continued Problem Potential:     Dimension 6:  Recovery/Living Environment:     ASAM Severity Score:    ASAM Recommended Level of Treatment:     Substance use Disorder (SUD)    Recommendations for Services/Supports/Treatments:    DSM5 Diagnoses: There are no problems to display for this patient.   P   Referrals to Alternative Service(s): Referred to Alternative Service(s):   Place:   Date:   Time:    Referred to Alternative Service(s):    Place:   Date:   Time:    Referred to Alternative Service(s):   Place:   Date:   Time:    Referred to Alternative Service(s):   Place:   Date:   Time:     Casey Silva  Comprehensive Clinical Assessment (CCA) Note  09/24/2019 Casey Silva [id number]  Visit Diagnosis:      ICD-10-CM   1. Suicidal ideation  R45.851       CCA Screening, Triage and Referral (STR)  Patient Reported Information How did you hear about [place]? Primary Care  Referral name: PCP doctor  Referral phone number: No data recorded  Whom do you see for routine medical problems? Primary Care  Practice/Facility Name: [place]  Practice/Facility Phone Number: No data recorded Name of Contact: No data recorded Contact Number: No data recorded Contact Fax Number: No data recorded Prescriber Name: No data recorded Prescriber Address (if known): No data recorded  What Is the Reason for Your Visit/Call Today? suicidal ideation/depression/anxiety  How Long Has This Been Causing You Problems? 1-6 months  What Do You Feel Would Help You the Most Today? Therapy;Medication;Other (Comment)   Have You Recently Been in Any Inpatient Treatment (Hospital/Detox/Crisis Center/28-Day Program)? No  Name/Location of Program/Hospital:No data recorded How Long Were You There? No data recorded When Were You Discharged? No data recorded  Have You Ever Received Services From [facility] Before? Yes  Who Do You See at [facility]? medical doctor   Have You Recently Had Any Thoughts About [place]? Yes  Are You Planning to Commit Suicide/Harm Yourself At This time? No   Have you Recently Had Thoughts About [employer]? No  Explanation: No data recorded  Have You Used Any Alcohol or Drugs in the Past 24 Hours? No  How Long Ago Did You Use Drugs or Alcohol? No data recorded What Did You Use and How Much? No data recorded  Do You Currently Have a Therapist/Psychiatrist? No  Name of  Therapist/Psychiatrist: No data recorded  Have You Been Recently Discharged From Any Office Practice or Programs? No  Explanation of Discharge From Practice/Program: No data recorded    CCA Screening Triage Referral Assessment Type of Contact: Tele-Assessment  Is this Initial or Reassessment? Initial Assessment  Date Telepsych consult ordered in CHL:  09/23/19  Time Telepsych consult ordered in CHL:  No data recorded  Patient Reported Information Reviewed? Yes  Patient Left Without Being Seen? No data recorded Reason for Not Completing Assessment: No data recorded  Collateral Involvement: none   Does Patient Have a [place]? No data recorded Name and Contact of  Legal Guardian: No data recorded If Minor and Not Living with Parent(s), Who has Custody? No data recorded Is CPS involved or ever been involved? Never  Is APS involved or ever been involved? Never   Patient Determined To Be At Risk for Harm To Self or Others Based on Review of Patient Reported Information or Presenting Complaint? No  Method: No data recorded Availability of Means: No data recorded Intent: No data recorded Notification Required: No data recorded Additional Information for Danger to Others Potential: No data recorded Additional Comments for Danger to Others Potential: No data recorded Are There Guns or Other Weapons in Your Home? No data recorded Types of Guns/Weapons: No data recorded Are These Weapons Safely Secured?                            No data recorded Who Could Verify You Are Able To Have These Secured: No data recorded Do You Have any Outstanding Charges, Pending Court Dates, Parole/Probation? No data recorded Contacted To Inform of Risk of Harm To Self or Others: No data recorded  [facility]: [facility]   Does Patient Present under Involuntary Commitment? No  IVC Papers Initial File Date: No data recorded  [place] of Residence: [place]   Patient  Currently Receiving the Following Services: Not Receiving Services   Determination of Need: No data recorded  Options For Referral: Inpatient Hospitalization     CCA Biopsychosocial  Intake/Chief Complaint:  CCA Intake With Chief Complaint Chief Complaint/Presenting Problem: Suicidal ideation Patient's Currently Reported Symptoms/Problems: anxiety/depression Type of Services Patient Feels Are Needed: counseling/medications  Mental Health Symptoms Depression:  Depression: Difficulty Concentrating, Fatigue, Hopelessness, Increase/decrease in appetite, Change in energy/activity, Worthlessness, Duration of symptoms greater than two weeks, Sleep (too much or little), Tearfulness  Mania:  Mania: None  Anxiety:   Anxiety: Restlessness, Difficulty concentrating, Fatigue, Sleep, Worrying, Tension  Psychosis:  Psychosis: None  Trauma:  Trauma: None  Obsessions:  Obsessions: None  Compulsions:  Compulsions: None  Inattention:  Inattention: Loses things, Forgetful  Hyperactivity/Impulsivity:  Hyperactivity/Impulsivity: N/A  Oppositional/Defiant Behaviors:  Oppositional/Defiant Behaviors: None  Emotional Irregularity:  Emotional Irregularity: None  Other Mood/Personality Symptoms:      Mental Status Exam Appearance and self-care  Stature:  Stature: Tall  Weight:  Weight: Average weight  Clothing:  Clothing: Casual  Grooming:  Grooming: Normal  Cosmetic use:  Cosmetic Use: Age appropriate  Posture/gait:  Posture/Gait: Normal  Motor activity:  Motor Activity: Restless  Sensorium  Attention:  Attention: Normal  Concentration:  Concentration: Anxiety interferes  Orientation:  Orientation: Object, Person, Place, Situation, Time, X5  Recall/memory:  Recall/Memory: Normal  Affect and Mood  Affect:  Affect: Appropriate, Depressed, Anxious  Mood:  Mood: Depressed, Anxious, Other (Comment)  Relating  Eye contact:  Eye Contact: Normal  Facial expression:  Facial Expression: Depressed   Attitude toward examiner:  Attitude Toward Examiner: Cooperative  Thought and Language  Speech flow: Speech Flow: Clear and Coherent  Thought content:  Thought Content: Appropriate to Mood and Circumstances (NA)  Preoccupation:  Preoccupations: None  Hallucinations:  Hallucinations: Auditory, Visual  Organization:     Transport planner of Knowledge:  Fund of Knowledge: Average  Intelligence:  Intelligence: Average  Abstraction:  Abstraction: Normal  Judgement:  Judgement: Normal  Reality Testing:  Reality Testing: Adequate  Insight:  Insight: Good  Decision Making:  Decision Making: Normal  Social Functioning  Social Maturity:  Social Maturity: Responsible  Social Judgement:  Social Judgement: Normal  Stress  Stressors:  Stressors: Museum/gallery curator, Illness  Coping Ability:     Skill Deficits:     Supports:        Religion:    Leisure/Recreation:    Exercise/Diet: Exercise/Diet Do You Follow a Special Diet?: No Do You Have Any Trouble Sleeping?: Yes Explanation of Sleeping Difficulties: restlessness/anxiety   CCA Employment/Education  Employment/Work Situation: Employment / Work Situation Employment situation: Unemployed Patient's job has been impacted by current illness: No What is the longest time patient has a held a job?: few years Has patient ever been in the [employer]?: No  Education: Education Is Patient Currently Attending School?: No Last Grade Completed: 12 Did Teacher, adult education From [employer]?: Yes Did Physicist, medical?: No Did Heritage manager?: No Did You Have An Individualized Education Program (IIEP): No Did You Have Any Difficulty At [employer]?: No Patient's Education Has Been Impacted by Current Illness: No   CCA Family/Childhood History  Family and Relationship History: Family history Marital status: Single Are you sexually active?: No  Childhood History:  Childhood History Does patient have siblings?: Yes Number of  Siblings: 2 Description of patient's current relationship with siblings: no contact Did patient suffer any verbal/emotional/physical/sexual abuse as a child?: No Did patient suffer from severe childhood neglect?: No Has patient ever been sexually abused/assaulted/raped as an adolescent or adult?: No Was the patient ever a victim of a crime or a disaster?: No Witnessed domestic violence?: No Has patient been affected by domestic violence as an adult?: No  Child/Adolescent Assessment:     CCA Substance Use  Alcohol/Drug Use: Alcohol / Drug Use History of alcohol / drug use?: No history of alcohol / drug abuse                         ASAM's:  Six Dimensions of Multidimensional Assessment  Dimension 1:  Acute Intoxication and/or Withdrawal Potential:      Dimension 2:  Biomedical Conditions and Complications:      Dimension 3:  Emotional, Behavioral, or Cognitive Conditions and Complications:     Dimension 4:  Readiness to Change:     Dimension 5:  Relapse, Continued use, or Continued Problem Potential:     Dimension 6:  Recovery/Living Environment:     ASAM Severity Score:    ASAM Recommended Level of Treatment:     Substance use Disorder (SUD)    Recommendations for Services/Supports/Treatments:   Comprehensive Clinical Assessment (CCA) Screening, Triage and Referral Note  09/24/2019 Casey Silva [id number]  Visit Diagnosis:    ICD-10-CM   1. Suicidal ideation  R45.851     Patient Reported Information How did you hear about [place]? Primary Care   Referral name: PCP doctor   Referral phone number: No data recorded Whom do you see for routine medical problems? Primary Care   Practice/Facility Name: [place]   Practice/Facility Phone Number: No data recorded  Name of Contact: No data recorded  Contact Number: No data recorded  Contact Fax Number: No data recorded  Prescriber Name: No data recorded  Prescriber Address (if known): No data  recorded What Is the Reason for Your Visit/Call Today? suicidal ideation/depression/anxiety  How Long Has This Been Causing You Problems? 1-6 months  Have You Recently Been in Any Inpatient Treatment (Hospital/Detox/Crisis Center/28-Day Program)? No   Name/Location of Program/Hospital:No data recorded  How Long Were You There? No data recorded  When Were You Discharged?  No data recorded Have You Ever Received Services From [facility] Before? Yes   Who Do You See at [facility]? medical doctor  Have You Recently Had Any Thoughts About [place]? Yes   Are You Planning to Commit Suicide/Harm Yourself At This time?  No  Have you Recently Had Thoughts About [place]? No  Comprehensive Clinical Assessment (CCA) Note  09/24/2019 Jaquann Guarisco [id number]  Visit Diagnosis:      ICD-10-CM   1. Suicidal ideation  R45.851       CCA Screening, Triage and Referral (STR)  Patient Reported Information How did you hear about [place]? Primary Care  Referral name: PCP doctor  Referral phone number: No data recorded  Whom do you see for routine medical problems? Primary Care  Practice/Facility Name: [place]  Practice/Facility Phone Number: No data recorded Name of Contact: No data recorded Contact Number: No data recorded Contact Fax Number: No data recorded Prescriber Name: No data recorded Prescriber Address (if known): No data recorded  What Is the Reason for Your Visit/Call Today? suicidal ideation/depression/anxiety  How Long Has This Been Causing You Problems? 1-6 months  What Do You Feel Would Help You the Most Today? Therapy;Medication;Other (Comment)   Have You Recently Been in Any Inpatient Treatment (Hospital/Detox/Crisis Center/28-Day Program)? No  Name/Location of Program/Hospital:No data recorded How Long Were You There? No data recorded When Were You Discharged? No data recorded  Have You Ever Received Services From [facility]  Before? Yes  Who Do You See at [facility]? medical doctor   Have You Recently Had Any Thoughts About [place]? Yes  Are You Planning to Commit Suicide/Harm Yourself At This time? No   Have you Recently Had Thoughts About [place]? No  Explanation: No data recorded  Have You Used Any Alcohol or Drugs in the Past 24 Hours? No  How Long Ago Did You Use Drugs or Alcohol? No data recorded What Did You Use and How Much? No data recorded  Do You Currently Have a Therapist/Psychiatrist? No  Name of Therapist/Psychiatrist: No data recorded  Have You Been Recently Discharged From Any Office Practice or Programs? No  Explanation of Discharge From Practice/Program: No data recorded    CCA Screening Triage Referral Assessment Type of Contact: Tele-Assessment  Is this Initial or Reassessment? Initial Assessment  Date Telepsych consult ordered in CHL:  09/23/19  Time Telepsych consult ordered in [facility]L:  No data recorded  Patient Reported Information Reviewed? Yes  Patient Left Without Being Seen? No data recorded Reason for Not Completing Assessment: No data recorded  Collateral Involvement: none   Does Patient Have a [place]? No data recorded Name and Contact of Legal Guardian: No data recorded If Minor and Not Living with Parent(s), Who has Custody? No data recorded Is CPS involved or ever been involved? Never  Is APS involved or ever been involved? Never   Patient Determined To Be At Risk for Harm To Self or Others Based on Review of Patient Reported Information or Presenting Complaint? No  Method: No data recorded Availability of Means: No data recorded Intent: No data recorded Notification Required: No data recorded Additional Information for Danger to Others Potential: No data recorded Additional Comments for Danger to Others Potential: No data recorded Are There Guns or Other Weapons in Your Home? No data recorded Types  of Guns/Weapons: No data recorded Are These Weapons Safely Secured?  No data recorded Who Could Verify You Are Able To Have These Secured: No data recorded Do You Have any Outstanding Charges, Pending Court Dates, Parole/Probation? No data recorded Contacted To Inform of Risk of Harm To Self or Others: No data recorded  Location of Assessment: [facility]   Does Patient Present under Involuntary Commitment? No  IVC Papers Initial File Date: No data recorded  [place] of Residence: [place]   Patient Currently Receiving the Following Services: Not Receiving Services   Determination of Need: No data recorded  Options For Referral: Inpatient Hospitalization     CCA Biopsychosocial  Intake/Chief Complaint:  CCA Intake With Chief Complaint Chief Complaint/Presenting Problem: Suicidal ideation Patient's Currently Reported Symptoms/Problems: anxiety/depression Type of Services Patient Feels Are Needed: counseling/medications  Mental Health Symptoms Depression:  Depression: Difficulty Concentrating, Fatigue, Hopelessness, Increase/decrease in appetite, Change in energy/activity, Worthlessness, Duration of symptoms greater than two weeks, Sleep (too much or little), Tearfulness  Mania:  Mania: None  Anxiety:   Anxiety: Restlessness, Difficulty concentrating, Fatigue, Sleep, Worrying, Tension  Psychosis:  Psychosis: None  Trauma:  Trauma: None  Obsessions:  Obsessions: None  Compulsions:  Compulsions: None  Inattention:  Inattention: Loses things, Forgetful  Hyperactivity/Impulsivity:  Hyperactivity/Impulsivity: N/A  Oppositional/Defiant Behaviors:  Oppositional/Defiant Behaviors: None  Emotional Irregularity:  Emotional Irregularity: None  Other Mood/Personality Symptoms:      Mental Status Exam Appearance and self-care  Stature:  Stature: Tall  Weight:  Weight: Average weight  Clothing:  Clothing: Casual  Grooming:  Grooming: Normal  Cosmetic use:   Cosmetic Use: Age appropriate  Posture/gait:  Posture/Gait: Normal  Motor activity:  Motor Activity: Restless  Sensorium  Attention:  Attention: Normal  Concentration:  Concentration: Anxiety interferes  Orientation:  Orientation: Object, Person, Place, Situation, Time, X5  Recall/memory:  Recall/Memory: Normal  Affect and Mood  Affect:  Affect: Appropriate, Depressed, Anxious  Mood:  Mood: Depressed, Anxious, Other (Comment)  Relating  Eye contact:  Eye Contact: Normal  Facial expression:  Facial Expression: Depressed  Attitude toward examiner:  Attitude Toward Examiner: Cooperative  Thought and Language  Speech flow: Speech Flow: Clear and Coherent  Thought content:  Thought Content: Appropriate to Mood and Circumstances (NA)  Preoccupation:  Preoccupations: None  Hallucinations:  Hallucinations: Auditory, Visual  Organization:     Transport planner of Knowledge:  Fund of Knowledge: Average  Intelligence:  Intelligence: Average  Abstraction:  Abstraction: Normal  Judgement:  Judgement: Normal  Reality Testing:  Reality Testing: Adequate  Insight:  Insight: Good  Decision Making:  Decision Making: Normal  Social Functioning  Social Maturity:  Social Maturity: Responsible  Social Judgement:  Social Judgement: Normal  Stress  Stressors:  Stressors: Museum/gallery curator, Illness  Coping Ability:     Skill Deficits:     Supports:        Religion:    Leisure/Recreation:    Exercise/Diet: Exercise/Diet Do You Follow a Special Diet?: No Do You Have Any Trouble Sleeping?: Yes Explanation of Sleeping Difficulties: restlessness/anxiety   CCA Employment/Education  Employment/Work Situation: Employment / Work Situation Employment situation: Unemployed Patient's job has been impacted by current illness: No What is the longest time patient has a held a job?: few years Has patient ever been in the [employer]?: No  Education: Education Is Patient Currently Attending  School?: No Last Grade Completed: 12 Did Teacher, adult education From [employer]?: Yes Did Physicist, medical?: No Did Heritage manager?: No Did You Have An Individualized Education Program (IIEP): No Did  You Have Any Difficulty At School?: No Patient's Education Has Been Impacted by Current Illness: No   CCA Family/Childhood History  Family and Relationship History: Family history Marital status: Single Are you sexually active?: No  Childhood History:  Childhood History Does patient have siblings?: Yes Number of Siblings: 2 Description of patient's current relationship with siblings: no contact Did patient suffer any verbal/emotional/physical/sexual abuse as a child?: No Did patient suffer from severe childhood neglect?: No Has patient ever been sexually abused/assaulted/raped as an adolescent or adult?: No Was the patient ever a victim of a crime or a disaster?: No Witnessed domestic violence?: No Has patient been affected by domestic violence as an adult?: No  Child/Adolescent Assessment:     CCA Substance Use  Alcohol/Drug Use: Alcohol / Drug Use History of alcohol / drug use?: No history of alcohol / drug abuse                         ASAM's:  Six Dimensions of Multidimensional Assessment  Dimension 1:  Acute Intoxication and/or Withdrawal Potential:      Dimension 2:  Biomedical Conditions and Complications:      Dimension 3:  Emotional, Behavioral, or Cognitive Conditions and Complications:     Dimension 4:  Readiness to Change:     Dimension 5:  Relapse, Continued use, or Continued Problem Potential:     Dimension 6:  Recovery/Living Environment:     ASAM Severity Score:    ASAM Recommended Level of Treatment:     Substance use Disorder (SUD)    Recommendations for Services/Supports/Treatments:    DSM5 Diagnoses: MDD, severe           GAD There are no problems to display for this patient.   Referrals to Alternative  Service(s): Referred to Alternative Service(s):   Place:   Date:   Time:    Referred to Alternative Service(s):   Place:   Date:   Time:    Referred to Alternative Service(s):   Place:   Date:   Time:    Referred to Alternative Service(s):   Place:   Date:   Time:     Casey Silva Explanation: No data recorded Have You Used Any Alcohol or Drugs in the Past 24 Hours? No   How Long Ago Did You Use Drugs or Alcohol?  No data recorded  What Did You Use and How Much? No data recorded What Do You Feel Would Help You the Most Today? Therapy;Medication;Other (Comment)  Do You Currently Have a Therapist/Psychiatrist? No   Name of Therapist/Psychiatrist: No data recorded  Have You Been Recently Discharged From Any Office Practice or Programs? No   Explanation of Discharge From Practice/Program:  No data recorded    CCA Screening Triage Referral Assessment Type of Contact: Tele-Assessment   Is this Initial or Reassessment? Initial Assessment   Date Telepsych consult ordered in [facility]:  09/23/19   Time Telepsych consult ordered in [facility]:  No data recorded Patient Reported Information Reviewed? Yes   Patient Left Without Being Seen? No data recorded  Reason for Not Completing Assessment: No data recorded Collateral Involvement: none  Does Patient Have a [place]? No data recorded  Name and Contact of Legal Guardian:  No data recorded If Minor and Not Living with Parent(s), Who has Custody? No data recorded Is CPS involved or ever been involved? Never  Is APS involved or ever been involved? Never  Patient Determined  To Be At Risk for Harm To Self or Others Based on Review of Patient Reported Information or Presenting Complaint? No   Method: No data recorded  Availability of Means: No data recorded  Intent: No data recorded  Notification Required: No data recorded  Additional Information for Danger to Others Potential:  No data recorded  Additional Comments for  Danger to Others Potential:  No data recorded  Are There Guns or Other Weapons in Your Home?  No data recorded   Types of Guns/Weapons: No data recorded   Are These Weapons Safely Secured?                              No data recorded   Who Could Verify You Are Able To Have These Secured:    No data recorded Do You Have any Outstanding Charges, Pending Court Dates, Parole/Probation? No data recorded Contacted To Inform of Risk of Harm To Self or Others: No data recorded Location of Assessment: [facility]  Does Patient Present under Involuntary Commitment? No   IVC Papers Initial File Date: No data recorded  [place] of Residence: [place]  Patient Currently Receiving the Following Services: Not Receiving Services   Determination of Need: No data recorded  Options For Referral: Inpatient Hospitalization   Casey Silva, LCSWA          DSM5 Diagnoses: MDD, single episode, severe            GAD There are no problems to display for this patient.  Comprehensive Clinical Assessment (CCA) Screening, Triage and Referral Note  09/24/2019 Casey Silva [id number]  Visit Diagnosis:    ICD-10-CM   1. Suicidal ideation  R45.851     Patient Reported Information How did you hear about [place]? Primary Care   Referral name: PCP doctor   Referral phone number: No data recorded Whom do you see for routine medical problems? Primary Care   Practice/Facility Name: [facility]   Practice/Facility Phone Number: No data recorded  Name of Contact: No data recorded  Contact Number: No data recorded  Contact Fax Number: No data recorded  Prescriber Name: No data recorded  Prescriber Address (if known): No data recorded What Is the Reason for Your Visit/Call Today? suicidal ideation/depression/anxiety  How Long Has This Been Causing You Problems? 1-6 months  Have You Recently Been in Any Inpatient Treatment (Hospital/Detox/Crisis Center/28-Day Program)? No   Name/Location of  Program/Hospital:No data recorded  How Long Were You There? No data recorded  When Were You Discharged? No data recorded Have You Ever Received Services From [facility] Before? Yes   Who Do You [facility]? medical doctor  Have You Recently Had Any Thoughts About [place]? Yes   Are You Planning to Commit Suicide/Harm Yourself At This time?  No  Have you Recently Had Thoughts About [place]? No   Explanation: No data recorded Have You Used Any Alcohol or Drugs in the Past 24 Hours? No   How Long Ago Did You Use Drugs or Alcohol?  No data recorded  What Did You Use and How Much? No data recorded What Do You Feel Would Help You the Most Today? Therapy;Medication;Other (Comment)  Do You Currently Have a Therapist/Psychiatrist? No   Name of Therapist/Psychiatrist: No data recorded  Have You Been Recently Discharged From Any Office Practice or Programs? No   Explanation of Discharge From Practice/Program:  No data recorded  CCA Screening Triage Referral Assessment Type of Contact: Tele-Assessment   Is this Initial or Reassessment? Initial Assessment   Date Telepsych consult ordered in [facility]:  09/23/19   Time Telepsych consult ordered in [facility]:  No data recorded Patient Reported Information Reviewed? Yes   Patient Left Without Being Seen? No data recorded  Reason for Not Completing Assessment: No data recorded Collateral Involvement: none  Does Patient Have a [place]? No data recorded  Name and Contact of Legal Guardian:  No data recorded If Minor and Not Living with Parent(s), Who has Custody? No data recorded Is CPS involved or ever been involved? Never  Is APS involved or ever been involved? Never  Patient Determined To Be At Risk for Harm To Self or Others Based on Review of Patient Reported Information or Presenting Complaint? No   Method: No data recorded  Availability of Means: No data recorded  Intent: No data  recorded  Notification Required: No data recorded  Additional Information for Danger to Others Potential:  No data recorded  Additional Comments for Danger to Others Potential:  No data recorded  Are There Guns or Other Weapons in Your Home?  No data recorded   Types of Guns/Weapons: No data recorded   Are These Weapons Safely Secured?                              No data recorded   Who Could Verify You Are Able To Have These Secured:    No data recorded Do You Have any Outstanding Charges, Pending Court Dates, Parole/Probation? No data recorded Contacted To Inform of Risk of Harm To Self or Others: No data recorded Location of Assessment: [facility]  Does Patient Present under Involuntary Commitment? No   IVC Papers Initial File Date: No data recorded  [place] of Residence: [place]  Patient Currently Receiving the Following Services: Not Receiving Services   Determination of Need: No data recorded  Options For Referral: Inpatient Hospitalization   Casey Silva, LCSWA             Referrals to Alternative Service(s): Referred to Alternative Service(s):   Place:   Date:   Time:    Referred to Alternative Service(s):   Place:   Date:   Time:    Referred to Alternative Service(s):   Place:   Date:   Time:    Referred to Alternative Service(s):   Place:   Date:   Time:     Gloriajean Dell McCainComprehensive Clinical Assessment (CCA) Screening, Triage and Referral Note  09/24/2019 Casey Silva [id number]  Visit Diagnosis:    ICD-10-CM   1. Suicidal ideation  R45.851     Patient Reported Information How did you hear about [place]? Primary Care   Referral name: PCP doctor   Referral phone number: No data recorded Whom do you see for routine medical problems? Primary Care   Practice/Facility Name: [facility]   Practice/Facility Phone Number: No data recorded  Name of Contact: No data recorded  Contact Number: No data recorded  Contact Fax Number: No data  recorded  Prescriber Name: No data recorded  Prescriber Address (if known): No data recorded What Is the Reason for Your Visit/Call Today? suicidal ideation/depression/anxiety  How Long Has This Been Causing You Problems? 1-6 months  Have You Recently Been in Any Inpatient Treatment (Hospital/Detox/Crisis Center/28-Day Program)? No   Name/Location of Program/Hospital:No data recorded  How Long  Were You There? No data recorded  When Were You Discharged? No data recorded Have You Ever Received Services From [facility] Before? Yes   Who Do You See at [facility]? medical doctor  Have You Recently Had Any Thoughts About [place]? Yes   Are You Planning to Commit Suicide/Harm Yourself At This time?  No  Have you Recently Had Thoughts About [place]? No   Explanation: No data recorded Have You Used Any Alcohol or Drugs in the Past 24 Hours? No   How Long Ago Did You Use Drugs or Alcohol?  No data recorded  What Did You Use and How Much? No data recorded What Do You Feel Would Help You the Most Today? Therapy;Medication;Other (Comment)  Do You Currently Have a Therapist/Psychiatrist? No   Name of Therapist/Psychiatrist: No data recorded  Have You Been Recently Discharged From Any Office Practice or Programs? No   Explanation of Discharge From Practice/Program:  No data recorded    CCA Screening Triage Referral Assessment Type of Contact: Tele-Assessment   Is this Initial or Reassessment? Initial Assessment   Date Telepsych consult ordered in [facility]:  09/23/19   Time Telepsych consult ordered in [facility]:  No data recorded Patient Reported Information Reviewed? Yes   Patient Left Without Being Seen? No data recorded  Reason for Not Completing Assessment: No data recorded Collateral Involvement: none  Does Patient Have a [place]? No data recorded  Name and Contact of Legal Guardian:  No data recorded If Minor and Not Living with  Parent(s), Who has Custody? No data recorded Is CPS involved or ever been involved? Never  Is APS involved or ever been involved? Never  Patient Determined To Be At Risk for Harm To Self or Others Based on Review of Patient Reported Information or Presenting Complaint? No   Method: No data recorded  Availability of Means: No data recorded  Intent: No data recorded  Notification Required: No data recorded  Additional Information for Danger to Others Potential:  No data recorded  Additional Comments for Danger to Others Potential:  No data recorded  Are There Guns or Other Weapons in Your Home?  No data recorded   Types of Guns/Weapons: No data recorded   Are These Weapons Safely Secured?                              No data recorded   Who Could Verify You Are Able To Have These Secured:    No data recorded Do You Have any Outstanding Charges, Pending Court Dates, Parole/Probation? No data recorded Contacted To Inform of Risk of Harm To Self or Others: No data recorded Location of Assessment: [facility]  Does Patient Present under Involuntary Commitment? No   IVC Papers Initial File Date: No data recorded  [place] of Residence: [place]  Patient Currently Receiving the Following Services: Not Receiving Services   Determination of Need: No data recorded  Options For Referral: Inpatient Hospitalization   Casey Silva, [place]

## 2019-09-24 NOTE — H&P (Signed)
Psychiatric Admission Assessment Adult  Patient Identification: Casey Silva MRN:  [id number] Date of Evaluation:  09/24/2019 Chief Complaint:  Psychosis ([place]) [F29] Principal Diagnosis: <principal problem not specified> Diagnosis:  Active Problems:   Psychosis ([place])  History of Present Illness: Patient is seen and examined.  Patient is a 64 year old male with a reported past psychiatric history of anxiety, depression and insomnia who presented to the [facility] emergency department on 09/24/2019 with suicidal ideation.  The patient stated that he has had a multiyear history of problems.  He stated that he has been anxious, impulsive, and having severe insomnia for many years.  He went into gross detail about jobs that he had that were successful and that he would leave abruptly.  He discussed his repeated cycles of insomnia.  He stated that he had been placed on medicines like clonazepam and paroxetine in the past for anxiety and sleep issues.  He never remain on these medications long-term.  He denied any previous psychiatric evaluations, psychiatric hospitalizations or psychiatric treatment from a psychiatrist.  He stated that he had a family history of bipolar disorder.  He stated his mother was bipolar.  During the interview he used several words of jargon to describe his symptoms, and was at times very difficult to get him to put them into his own words.  He stated that most recently that he had had several days of worsening insomnia.  He stated that his insomnia had gotten to the point that he would "hear music playing", and as well see shadows and other figures.  He attributed his worsening depression and anxiety to his current living situation.  He stated he had been in a boardinghouse for several years, and he admitted that the other cohabitant's were not of the highest quality of people.  He also mentions seeing roaches and ants in the house, and it is unclear whether or not  this was true, or whether this may have been related to psychosis.  He has had multiple jobs over the years.  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  He had conflict with one of his coworkers over the coworker wanting a more significant relationship.  He also felt as though that the staff been at home watched inappropriate movies containing violence and offensive language.  He ended up leaving that job on 24 May.  He denied any drugs or alcohol.  He denied any previous treatment for symptoms as this.  He stated this was the first time that he had ever gotten so bad that he heard things or saw things.  He denied any episodes of euphoria or excessive spending.  He did admit to impulsivity and depressive episodes.  Primarily his major issue was anxiety.  He was very somatic during the interview.  I discussed headache pain, tooth problems, "my gastrointestinal system shutting down", decreased appetite.  He was admitted to the hospital for evaluation and stabilization.  Associated Signs/Symptoms: Depression Symptoms:  depressed mood, anhedonia, insomnia, psychomotor agitation, fatigue, feelings of worthlessness/guilt, difficulty concentrating, hopelessness, suicidal thoughts without plan, anxiety, loss of energy/fatigue, disturbed sleep, (Hypo) Manic Symptoms:  Hallucinations, Impulsivity, Irritable Mood, Anxiety Symptoms:  Excessive Worry, Psychotic Symptoms:  Hallucinations: Auditory PTSD Symptoms: Negative Total Time spent with patient: 45 minutes  Past Psychiatric History: Patient denied any formal psychiatric evaluations.  He denied any previous psychiatric admissions.  He denied any previous suicide attempts.  He has received SSRI antidepressants as well as benzodiazepines from  his primary care provider for anxiety issues.  He stated that he has had problems with sleep for years.  Is the patient at risk to self? Yes.    Has the patient been a risk to self in the past  6 months? No.  Has the patient been a risk to self within the distant past? No.  Is the patient a risk to others? No.  Has the patient been a risk to others in the past 6 months? No.  Has the patient been a risk to others within the distant past? No.   Prior Inpatient Therapy:   Prior Outpatient Therapy:    Alcohol Screening: 1. How often do you have a drink containing alcohol?: Never 2. How many drinks containing alcohol do you have on a typical day when you are drinking?: 1 or 2 3. How often do you have six or more drinks on one occasion?: Never AUDIT-C Score: 0 5. How often during the last year have you failed to do what was normally expected from you because of drinking?: Never 6. How often during the last year have you needed a first drink in the morning to get yourself going after a heavy drinking session?: Never 7. How often during the last year have you had a feeling of guilt of remorse after drinking?: Never 8. How often during the last year have you been unable to remember what happened the night before because you had been drinking?: Never 9. Have you or someone else been injured as a result of your drinking?: No 10. Has a relative or friend or a doctor or another health worker been concerned about your drinking or suggested you cut down?: No Alcohol Use Disorder Identification Test Final Score (AUDIT): 0 Substance Abuse History in the last 12 months:  No. Consequences of Substance Abuse: Negative Previous Psychotropic Medications: Yes  Psychological Evaluations: No  Past Medical History:  Past Medical History:  Diagnosis Date  . Anxiety   . Depression    History reviewed. No pertinent surgical history. Family History:  Family History  Problem Relation Age of Onset  . Bipolar disorder Mother    Family Psychiatric  History: Denied Tobacco Screening:   Social History:  Social History   Substance and Sexual Activity  Alcohol Use No     Social History   Substance  and Sexual Activity  Drug Use No    Additional Social History:                           Allergies:   Allergies  Allergen Reactions  . Dust Mite Mixed Allergen Ext [Mite (D. Farinae)] Shortness Of Breath  . Bee Pollen     Watery Eyes   Lab Results:  Results for orders placed or performed during the hospital encounter of 09/23/19 (from the past 48 hour(s))  SARS Coronavirus 2 by RT PCR (hospital order, performed in [facility] hospital lab) Nasopharyngeal Nasopharyngeal Swab     Status: None   Collection Time: 09/23/19  4:49 PM   Specimen: Nasopharyngeal Swab  Result Value Ref Range   SARS Coronavirus 2 NEGATIVE NEGATIVE    Comment: (NOTE) SARS-CoV-2 target nucleic acids are NOT DETECTED.  The SARS-CoV-2 RNA is generally detectable in upper and lower respiratory specimens during the acute phase of infection. The lowest concentration of SARS-CoV-2 viral copies this assay can detect is 250 copies / mL. A negative result does not preclude SARS-CoV-2 infection and should  not be used as the sole basis for treatment or other patient management decisions.  A negative result may occur with improper specimen collection / handling, submission of specimen other than nasopharyngeal swab, presence of viral mutation(s) within the areas targeted by this assay, and inadequate number of viral copies (<250 copies / mL). A negative result must be combined with clinical observations, patient history, and epidemiological information.  Fact Sheet for Patients:   StrictlyIdeas.no  Fact Sheet for Healthcare Providers: BankingDealers.co.za  This test is not yet approved or  cleared by the [place] FDA and has been authorized for detection and/or diagnosis of SARS-CoV-2 by FDA under an Emergency Use Authorization (EUA).  This EUA will remain in effect (meaning this test can be used) for the duration of the COVID-19 declaration under  Section 564(b)(1) of the Act, 21 U.S.C. section 360bbb-3(b)(1), unless the authorization is terminated or revoked sooner.  Performed at [facility], [place] [address] [place], [place] [address]   Comprehensive metabolic panel     Status: None   Collection Time: 09/23/19  4:49 PM  Result Value Ref Range   Sodium 135 135 - 145 mmol/L   Potassium 4.1 3.5 - 5.1 mmol/L   Chloride 98 98 - 111 mmol/L   CO2 22 22 - 32 mmol/L   Glucose, Bld 97 70 - 99 mg/dL    Comment: Glucose reference range applies only to samples taken after fasting for at least 8 hours.   BUN 14 8 - 23 mg/dL   Creatinine, Ser 0.85 0.61 - 1.24 mg/dL   Calcium 9.2 8.9 - 10.3 mg/dL   Total Protein 7.9 6.5 - 8.1 g/dL   Albumin 4.6 3.5 - 5.0 g/dL   AST 21 15 - 41 U/L   ALT 19 0 - 44 U/L   Alkaline Phosphatase 54 38 - 126 U/L   Total Bilirubin 1.1 0.3 - 1.2 mg/dL   GFR calc non Af Amer >60 >60 mL/min   GFR calc Af Amer >60 >60 mL/min   Anion gap 15 5 - 15    Comment: Performed at [facility], [place] [address] [place], [place] [address]  Ethanol     Status: None   Collection Time: 09/23/19  4:49 PM  Result Value Ref Range   Alcohol, Ethyl (B) <10 <10 mg/dL    Comment: (NOTE) Lowest detectable limit for serum alcohol is 10 mg/dL.  For medical purposes only. Performed at [facility], [place] [address] [place], [place] [address]   Urine rapid drug screen (hosp performed)     Status: None   Collection Time: 09/23/19  4:49 PM  Result Value Ref Range   Opiates NONE DETECTED NONE DETECTED   Cocaine NONE DETECTED NONE DETECTED   Benzodiazepines NONE DETECTED NONE DETECTED   Amphetamines NONE DETECTED NONE DETECTED   Tetrahydrocannabinol NONE DETECTED NONE DETECTED   Barbiturates NONE DETECTED NONE DETECTED    Comment: (NOTE) DRUG SCREEN FOR MEDICAL PURPOSES ONLY.  IF CONFIRMATION IS NEEDED FOR ANY PURPOSE, NOTIFY LAB WITHIN 5 DAYS.  LOWEST DETECTABLE  LIMITS FOR URINE DRUG SCREEN Drug Class                     Cutoff (ng/mL) Amphetamine and metabolites    1000 Barbiturate and metabolites    200 Benzodiazepine                 417 Tricyclics and metabolites     300  Opiates and metabolites        300 Cocaine and metabolites        300 THC                            50 Performed at [place] [address], [place], [place] [address]   CBC with Diff     Status: None   Collection Time: 09/23/19  4:49 PM  Result Value Ref Range   WBC 7.4 4.0 - 10.5 K/uL   RBC 5.07 4.22 - 5.81 MIL/uL   Hemoglobin 15.0 13.0 - 17.0 g/dL   HCT 44.3 39 - 52 %   MCV 87.4 80.0 - 100.0 fL   MCH 29.6 26.0 - 34.0 pg   MCHC 33.9 30.0 - 36.0 g/dL   RDW 13.2 11.5 - 15.5 %   Platelets 335 150 - 400 K/uL   nRBC 0.0 0.0 - 0.2 %   Neutrophils Relative % 69 %   Neutro Abs 5.1 1.7 - 7.7 K/uL   Lymphocytes Relative 21 %   Lymphs Abs 1.6 0.7 - 4.0 K/uL   Monocytes Relative 7 %   Monocytes Absolute 0.5 0 - 1 K/uL   Eosinophils Relative 2 %   Eosinophils Absolute 0.2 0 - 0 K/uL   Basophils Relative 1 %   Basophils Absolute 0.1 0 - 0 K/uL   Immature Granulocytes 0 %   Abs Immature Granulocytes 0.03 0.00 - 0.07 K/uL    Comment: Performed at [facility], [place] [address], [place], [place] [address]  Acetaminophen level     Status: Abnormal   Collection Time: 09/23/19  4:49 PM  Result Value Ref Range   Acetaminophen (Tylenol), Serum <10 (L) 10 - 30 ug/mL    Comment: (NOTE) Therapeutic concentrations vary significantly. A range of 10-30 ug/mL  may be an effective concentration for many patients. However, some  are best treated at concentrations outside of this range. Acetaminophen concentrations >150 ug/mL at 4 hours after ingestion  and >50 ug/mL at 12 hours after ingestion are often associated with  toxic reactions.  Performed at [facility], [place] [address] [place], [place] [address]   Salicylate  level     Status: Abnormal   Collection Time: 09/23/19  4:49 PM  Result Value Ref Range   Salicylate Lvl <1.0 (L) 7.0 - 30.0 mg/dL    Comment: Performed at [facility], [place] [address], [place], [place] [address]    Blood Alcohol level:  Lab Results  Component Value Date   ETH <10 66/44/0347    Metabolic Disorder Labs:  No results found for: HGBA1C, MPG No results found for: PROLACTIN No results found for: CHOL, TRIG, HDL, CHOLHDL, VLDL, LDLCALC  Current Medications: Current Facility-Administered Medications  Medication Dose Route Frequency Provider Last Rate Last Admin  . acetaminophen (TYLENOL) tablet 650 mg  650 mg Oral Q6H PRN Sharma Covert, MD      . albuterol (VENTOLIN HFA) 108 (90 Base) MCG/ACT inhaler 1-2 puff  1-2 puff Inhalation Q6H PRN Sharma Covert, MD      . alum & mag hydroxide-simeth (MAALOX/MYLANTA) 200-200-20 MG/5ML suspension 30 mL  30 mL Oral Q4H PRN Sharma Covert, MD      . aspirin EC tablet 81 mg  81 mg Oral Daily Sharma Covert, MD   81 mg at 09/24/19 1119  . feeding supplement (ENSURE ENLIVE) (ENSURE  ENLIVE) liquid 237 mL  237 mL Oral BID BM Sharma Covert, MD      . hydrOXYzine (ATARAX/VISTARIL) tablet 25 mg  25 mg Oral TID PRN Sharma Covert, MD      . lisinopril (ZESTRIL) tablet 10 mg  10 mg Oral Daily Sharma Covert, MD   10 mg at 09/24/19 1119  . magnesium hydroxide (MILK OF MAGNESIA) suspension 30 mL  30 mL Oral Daily PRN Sharma Covert, MD      . risperiDONE (RISPERDAL) tablet 0.5 mg  0.5 mg Oral Daily Sharma Covert, MD   0.5 mg at 09/24/19 1118  . risperiDONE (RISPERDAL) tablet 2 mg  2 mg Oral QHS Sharma Covert, MD      . sertraline (ZOLOFT) tablet 25 mg  25 mg Oral Daily Sharma Covert, MD   25 mg at 09/24/19 1118  . traZODone (DESYREL) tablet 50 mg  50 mg Oral QHS PRN Sharma Covert, MD       PTA Medications: Medications Prior to Admission  Medication Sig Dispense Refill Last  Dose  . albuterol (VENTOLIN HFA) 108 (90 Base) MCG/ACT inhaler Inhale 2 puffs into the lungs every 6 (six) hours as needed for wheezing or shortness of breath.      . clonazePAM (KLONOPIN) 1 MG tablet Take 1-2 tablets by mouth nightly as needed. PATIENT NEEDS GO TO OR EST PCP FOR ADDITIONAL REFILLS (Patient not taking: Reported on 09/23/2019) 60 tablet 0   . IBU 400 MG tablet Take 400-800 mg by mouth 3 (three) times daily as needed for moderate pain.      . Misc Natural Products (NF FORMULAS TESTOSTERONE PO) Take 1 tablet by mouth daily.     . Multiple Vitamin (MULTIVITAMIN ADULT) TABS Take 1 tablet by mouth daily.     Marland Kitchen PARoxetine (PAXIL) 20 MG tablet Take 1 tablet (20 mg total) by mouth daily. PATIENT NEEDS GO TO OR EST PCP FOR ADDITIONAL REFILLS (Patient not taking: Reported on 09/23/2019) 30 tablet 0     Musculoskeletal: Strength & Muscle Tone: within normal limits Gait & Station: normal Patient leans: N/A  Psychiatric Specialty Exam: Physical Exam  Nursing note and vitals reviewed. Constitutional: He is oriented to person, place, and time.  HENT:  Head: Normocephalic and atraumatic.  Respiratory: Effort normal.  GI: Normal appearance.  Neurological: He is alert and oriented to person, place, and time.    Review of Systems  Blood pressure (!) 137/93, pulse 67, temperature 98.5 F (36.9 C), temperature source Oral, resp. rate 18, height 5\' 10"  (1.778 m), weight 79.8 kg, SpO2 100 %.Body mass index is 25.25 kg/m.  General Appearance: Casual  Eye Contact:  Fair  Speech:  Normal Rate  Volume:  Normal  Mood:  Anxious and Depressed  Affect:  Congruent  Thought Process:  Coherent and Descriptions of Associations: Intact  Orientation:  Full (Time, Place, and Person)  Thought Content:  Hallucinations: Auditory, Obsessions and Rumination  Suicidal Thoughts:  Yes.  without intent/plan  Homicidal Thoughts:  No  Memory:  Immediate;   Fair Recent;   Fair Remote;   Fair  Judgement:   Intact  Insight:  Lacking  Psychomotor Activity:  Increased  Concentration:  Concentration: Fair and Attention Span: Fair  Recall:  [employer] of Knowledge:  Good  Language:  Good  Akathisia:  Negative  Handed:  Right  AIMS (if indicated):     Assets:  Desire for Improvement Housing Resilience  ADL's:  Intact  Cognition:  WNL  Sleep:       Treatment Plan Summary: Daily contact with patient to assess and evaluate symptoms and progress in treatment, Medication management and Plan : Patient is seen and examined.  Patient is a 64 year old male with the above-stated past psychiatric history who presented to the [facility] emergency department with suicidal ideation.  He has symptoms of depression, anxiety, almost obsessive compulsive thinking.  He is very somatic, and mild psychotic symptoms.  He will be admitted to the hospital.  He will be integrated in the milieu.  He will be encouraged to attend groups.  It is unclear what the full diagnosis is.  Clearly he is depressed, clearly he is anxious.  He has components of OCD, but does not fulfill criteria for bipolar disorder.  He has not had any euphoric episodes described.  His psychotic symptoms may be related to his insomnia and perhaps psychotic depression.  We will go on and start him on Risperdal 0.5 mg p.o. daily and 2 mg p.o. nightly for psychosis, mood stability and sleep.  He will also be started on Zoloft 25 mg p.o. daily for depression, anxiety and possible obsessive-compulsive disorder.  I do not think he fulfills criteria extensively for OCD, but he is somatic and may fulfill criteria for somatization disorder.  Review of his admission laboratories revealed essentially normal electrolytes, normal CBC, normal differential.  Acetaminophen was less than 10, salicylate was less than 7.  Blood alcohol was less than 10.  Drug screen was negative.  His EKG showed a normal sinus rhythm with a QTc interval of 422.  TSH was not  ordered, but that will be placed now.  He will also have available trazodone 50 mg p.o. nightly as needed insomnia as well as hydroxyzine 25 mg p.o. every 6 hours as needed anxiety.  Observation Level/Precautions:  15 minute checks  Laboratory:  Chemistry Profile  Psychotherapy:    Medications:    Consultations:    Discharge Concerns:    Estimated LOS:  Other:     Physician Treatment Plan for Primary Diagnosis: <principal problem not specified> Long Term Goal(s): Improvement in symptoms so as ready for discharge  Short Term Goals: Ability to identify changes in lifestyle to reduce recurrence of condition will improve, Ability to verbalize feelings will improve, Ability to disclose and discuss suicidal ideas, Ability to demonstrate self-control will improve, Ability to identify and develop effective coping behaviors will improve and Ability to maintain clinical measurements within normal limits will improve  Physician Treatment Plan for Secondary Diagnosis: Active Problems:   Psychosis ([place])  Long Term Goal(s): Improvement in symptoms so as ready for discharge  Short Term Goals: Ability to identify changes in lifestyle to reduce recurrence of condition will improve, Ability to verbalize feelings will improve, Ability to disclose and discuss suicidal ideas, Ability to demonstrate self-control will improve, Ability to identify and develop effective coping behaviors will improve and Ability to maintain clinical measurements within normal limits will improve  I certify that inpatient services furnished can reasonably be expected to improve the patient's condition.    Sharma Covert, MD 6/16/20211:59 PM

## 2019-09-25 DIAGNOSIS — F23 Brief psychotic disorder: Secondary | ICD-10-CM

## 2019-09-25 MED ORDER — ENSURE ENLIVE PO LIQD
237.0000 mL | ORAL | Status: DC
  Administered 2019-09-26: 237 mL via ORAL

## 2019-09-25 NOTE — Progress Notes (Signed)
Nutrition Brief Note RD working remotely.  Patient identified on the Malnutrition Screening Tool (MST) Report  Wt Readings from Last 15 Encounters:  09/24/19 79.8 kg  09/23/19 80 kg  02/27/13 80.4 kg  02/13/13 80.6 kg    Body mass index is 25.25 kg/m. Patient meets criteria for overweight based on current BMI. Weight on 6/15 and 6/16 was 176 lb. PTA, the most recently documented weight was on 02/27/13 when he weighed 177 lb.  Current diet order is Regular and patient is eating as desired for meals and snacks. Labs and medications reviewed.   Ensure Enlive was ordered BID yesterday and patient accepted one of the two bottles offered. Will decrease Ensure from BID to once/day.   No additional nutrition interventions warranted at this time. If nutrition issues arise, please consult RD.     Jarome Matin, MS, RD, LDN, CNSC Inpatient Clinical Dietitian RD pager # available in [place]  After hours/weekend pager # available in [facility]

## 2019-09-25 NOTE — Progress Notes (Signed)
   09/25/19 0100  Psych Admission Type (Psych Patients Only)  Admission Status Voluntary  Psychosocial Assessment  Patient Complaints Depression;Isolation;Loneliness  Eye Contact Fair  Facial Expression Sad  Affect Sad  Occupational hygienist Activity Slow  Appearance/Hygiene Unremarkable  Behavior Characteristics Cooperative  Mood Sad  Thought Process  Coherency WDL  Content Phobias  Delusions None reported or observed  Perception WDL  Hallucination None reported or observed  Judgment WDL  Confusion WDL  Danger to Self  Current suicidal ideation? Denies  Danger to Others  Danger to Others None reported or observed  Pt verbalizing feelings enjoying group activities and being around peers and staff stated that he hates staying alone and it has been making him so depressed. Pt educated on coping skills. Denies SI or HI will continue to monitor.

## 2019-09-25 NOTE — Progress Notes (Signed)
°   09/25/19 2039  Psych Admission Type (Psych Patients Only)  Admission Status Voluntary  Psychosocial Assessment  Patient Complaints Depression  Eye Contact Fair  Facial Expression Pensive;Sullen;Sad;Worried  Affect Anxious;Sad;Sullen  Soil scientist;Soft  Interaction Assertive  Motor Activity Slow  Appearance/Hygiene In scrubs  Thought Process  Coherency WDL  Content WDL  Delusions None reported or observed  Perception Hallucinations  Hallucination Auditory  Judgment Poor  Confusion None  Danger to Self  Current suicidal ideation? Denies  Danger to Others  Danger to Others None reported or observed

## 2019-09-25 NOTE — BHH Counselor (Signed)
Adult Comprehensive Assessment  Patient ID: Casey Silva, male   DOB: 1956/04/02, 64 y.o.   MRN: [id number]  Information Source: Information source: Patient  Current Stressors:  Patient states their primary concerns and needs for treatment are:: Describes a pattern of worsening depression starting in December 2020 paired with unfortunate events and insomnia. 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 Reports conflict with other staff and violent patients. 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 Housing / Lack of housing: Big stressor. "It's a depressing environment." Lives in a boarding house that is dirty and not well maintained, would like to move. Physical health (include injuries & life threatening diseases): Recently got dentures, lost a lot of teeth last year. History of insomnia. Recently fainted. Social relationships: No social supports. Isolated and lonely. Substance abuse: Denies, has self medicated with marijuana in the past to manage his insomnia. Bereavement / Loss: Denies  Living/Environment/Situation:  Living Arrangements: Alone Living conditions (as described by patient or guardian): Rents a room in a run down boarding house in [place] Who else lives in the home?: No one How long has patient lived in current situation?: 5 years, was previously homeless and living at [facility] What is atmosphere in current home: Temporary, Other (Comment) ("Depressing.")  Family History:  Marital status: Single Are you sexually active?: No Does patient have children?: No  Childhood  History:  By whom was/is the patient raised?: Other (Comment) Additional childhood history information: Did not discuss  Education:  Currently a student?: No Learning disability?: No  Employment/Work Situation:   Employment situation: Unemployed (Recently left a job and plans to go back to driving for [employer]) What is the longest time patient has a held a job?: 4 years Where was the patient employed at that time?: [employer] Has patient ever been in the [employer]?: No  Financial Resources:   Financial resources: No income  Alcohol/Substance Abuse:   What has been your use of drugs/alcohol within the last 12 months?: Denies Alcohol/Substance Abuse Treatment Hx: Denies past history Has alcohol/substance abuse ever caused legal problems?: No  Social Support System:   Heritage manager System: None Describe Community Support System: No supports, very isolated. Type of faith/religion: Jewish How does patient's faith help to cope with current illness?: Prays daily  Leisure/Recreation:   Do You Have Hobbies?: No (Used to enjoy fishing and skiing.)  Strengths/Needs:   What is the patient's perception of their strengths?: "I'm resilient." Patient states they can use these personal strengths during their treatment to contribute to their recovery: Wants to feel better and start working toward a happier and healthier future. Patient states these barriers may affect/interfere with their treatment: Denies Patient states these barriers may affect their return to the community: Returning to the same depressing living environment Other important information patient would like considered in planning for their treatment: Would love resources for other boarding houses or roommates.  Discharge Plan:   Currently receiving community mental health services: No Patient states concerns and preferences for aftercare planning are: Has a primary care doctor through [facility].  Agreeable to being referred to [facility] for therapy and medication management  Patient states they will know when they are safe and ready for discharge when: Is feeling better already, after he is able to sleep consistently and minimize the cycling negative thoughts. Does patient have access to transportation?: Yes Does patient have financial barriers related to discharge medications?: Yes Patient description of barriers related to discharge medications: No income or insurance Will patient be returning to same living situation after discharge?: Yes  Summary/Recommendations:   Summary and Recommendations (to be completed by the evaluator): Hershell is a 63 year old male from [place] history of anxiety and depression, who presents to the ED via [facility] from his PCPs office for evaluation of SI. Patient reports that he has been struggling with anxiety and depression for many years, for a long time did not seek any medical help and was not on any medication for this. Patient describes his living arrangements as a stressor. While here, Sir can benefit from crisis stabilization, medication management, therapeutic milieu, and referrals for services.  Joellen Jersey. 09/25/2019

## 2019-09-25 NOTE — BHH Suicide Risk Assessment (Signed)
[facility] INPATIENT:  Family/Significant Other Suicide Prevention Education  Suicide Prevention Education:  Patient Refusal for Family/Significant Other Suicide Prevention Education: The patient Casey Silva has refused to provide written consent for family/significant other to be provided Family/Significant Other Suicide Prevention Education during admission and/or prior to discharge.  Physician notified.  SPE reviewed with patient.   Joellen Jersey 09/25/2019, 11:00 AM

## 2019-09-25 NOTE — Plan of Care (Signed)
Progress note  D: pt found in bed; compliant with medication administration. Pt denies any physical complaints or pain. Pt presents anxious and pensive. Pt states they slept well. Pt feels their medications are helping with rest and the voices. Pt is pleasant. Pt denies si/hi/ah/vh and verbally agrees to approach staff if these become apparent or before harming themself/others while at [facility].  A: Pt provided support and encouragement. Pt given medication per protocol and standing orders. Q60m safety checks implemented and continued.  R: Pt safe on the unit. Will continue to monitor.  Pt progressing in the following metrics  Problem: Education: Goal: Knowledge of [facility] Education information/materials will improve Outcome: Progressing Goal: Emotional status will improve Outcome: Progressing Goal: Mental status will improve Outcome: Progressing Goal: Verbalization of understanding the information provided will improve Outcome: Progressing

## 2019-09-25 NOTE — Progress Notes (Signed)
[facility] MD Progress Note  09/25/2019 3:04 PM [place] Po  MRN:  [id number]  Subjective: Demani reports, "I'm feeling much better today than yesterday. I came to the hospital because I was faced with one problem after another that caused me to have severe insomnia for 6 months. I just could not sleep. This led to me degrading rapidly to where I started having suicidal thoughts. I was also alone, feeling lonely, which worsened everything. I feel real good here. I'm with a lot of people, talking & laughing together. I slept well last night. My appetite has improved since coming to the hospital".  Objective: Patient is a 64 year old male with a reported past psychiatric history of anxiety, depression and insomnia who presented to the [facility] emergency department on 09/24/2019 with suicidal ideation. The patient stated that he has had a multiyear history of problems. He stated that he has been anxious, impulsive, and having severe insomnia for many years. He went into gross detail about jobs that he had that were successful and that he would leave abruptly. He discussed his repeated cycles of insomnia. He stated that he had been placed on medicines like clonazepam and paroxetine in the past for anxiety and sleep issues.  Jahir is seen, chart reviewed. The chart findings discussed with the treatment team. He presents alert, oriented & aware of situation. He is visible on the unit, attending group session. He reports feeling a lot better, see the subject notes above. He denies any new issues or concerns. He is taking & tolerating his treatment regimen. He denies any side effects. He denies any SIHI, AVH, delusional thoughts or paranoia. He does not appear to be responding to any internal stimuli.  Principal Problem: Brief psychotic disorder ([place])  Diagnosis: Principal Problem:   Brief psychotic disorder ([place]) Active Problems:   Psychosis ([place])  Total Time spent with patient: 25  minutes  Past Psychiatric History: See H&P.  Past Medical History:  Past Medical History:  Diagnosis Date  . Anxiety   . Depression    History reviewed. No pertinent surgical history. Family History:  Family History  Problem Relation Age of Onset  . Bipolar disorder Mother    Family Psychiatric  History: See H&P.  Social History:  Social History   Substance and Sexual Activity  Alcohol Use No     Social History   Substance and Sexual Activity  Drug Use No    Social History   Socioeconomic History  . Marital status: Single    Spouse name: Not on file  . Number of children: 1  . Years of education: Not on file  . Highest education level: GED or equivalent  Occupational History  . Not on file  Tobacco Use  . Smoking status: Never Smoker  . Smokeless tobacco: Never Used  Vaping Use  . Vaping Use: Unknown  Substance and Sexual Activity  . Alcohol use: No  . Drug use: No  . Sexual activity: Not Currently  Other Topics Concern  . Not on file  Social History Narrative   Marital status:  Single      Children: none      Employment:  Unemployment.  Independently wealthy; previous work for [employer].      Tobacco:  1-2 cigarettes per day since age 32.      Alcohol:  None      Drugs:  none      Exercise:  Regularly very      Religion:  Jewish   Social Determinants of Radio broadcast assistant Strain:   . Difficulty of Paying Living Expenses:   Food Insecurity:   . Worried About Charity fundraiser in the Last Year:   . Arboriculturist in the Last Year:   Transportation Needs:   . Film/video editor (Medical):   Marland Kitchen Lack of Transportation (Non-Medical):   Physical Activity:   . Days of Exercise per Week:   . Minutes of Exercise per Session:   Stress:   . Feeling of Stress :   Social Connections:   . Frequency of Communication with Friends and Family:   . Frequency of Social Gatherings with Friends and Family:   . Attends Religious Services:    . Active Member of Clubs or Organizations:   . Attends Archivist Meetings:   Marland Kitchen Marital Status:    Additional Social History:   Sleep: Good  Appetite:  Good  Current Medications: Current Facility-Administered Medications  Medication Dose Route Frequency Provider Last Rate Last Admin  . acetaminophen (TYLENOL) tablet 650 mg  650 mg Oral Q6H PRN Sharma Covert, MD      . albuterol (VENTOLIN HFA) 108 (90 Base) MCG/ACT inhaler 1-2 puff  1-2 puff Inhalation Q6H PRN Sharma Covert, MD      . alum & mag hydroxide-simeth (MAALOX/MYLANTA) 200-200-20 MG/5ML suspension 30 mL  30 mL Oral Q4H PRN Sharma Covert, MD      . aspirin EC tablet 81 mg  81 mg Oral Daily Sharma Covert, MD   81 mg at 09/25/19 0806  . [START ON 09/26/2019] feeding supplement (ENSURE ENLIVE) (ENSURE ENLIVE) liquid 237 mL  237 mL Oral Q24H Sharma Covert, MD      . hydrOXYzine (ATARAX/VISTARIL) tablet 25 mg  25 mg Oral TID PRN Sharma Covert, MD      . lisinopril (ZESTRIL) tablet 10 mg  10 mg Oral Daily Sharma Covert, MD   10 mg at 09/25/19 0806  . magnesium hydroxide (MILK OF MAGNESIA) suspension 30 mL  30 mL Oral Daily PRN Sharma Covert, MD      . risperiDONE (RISPERDAL) tablet 0.5 mg  0.5 mg Oral Daily Sharma Covert, MD   0.5 mg at 09/25/19 0806  . risperiDONE (RISPERDAL) tablet 2 mg  2 mg Oral QHS Sharma Covert, MD   2 mg at 09/24/19 2100  . sertraline (ZOLOFT) tablet 25 mg  25 mg Oral Daily Sharma Covert, MD   25 mg at 09/25/19 0806  . traZODone (DESYREL) tablet 50 mg  50 mg Oral QHS PRN Sharma Covert, MD   50 mg at 09/24/19 2054   Lab Results:  Results for orders placed or performed during the hospital encounter of 09/24/19 (from the past 48 hour(s))  TSH     Status: None   Collection Time: 09/24/19  6:00 PM  Result Value Ref Range   TSH 1.267 0.350 - 4.500 uIU/mL    Comment: Performed by a 3rd Generation assay with a functional sensitivity of <=0.01  uIU/mL. Performed at [facility], [place] [address] [place], [place] [address]    Blood Alcohol level:  Lab Results  Component Value Date   ETH <10 60/45/4098   Metabolic Disorder Labs: No results found for: HGBA1C, MPG No results found for: PROLACTIN No results found for: CHOL, TRIG, HDL, CHOLHDL, VLDL, LDLCALC  Physical Findings: AIMS:  , ,  ,  ,  CIWA:    COWS:     Musculoskeletal: Strength & Muscle Tone: within normal limits Gait & Station: normal Patient leans: N/A  Psychiatric Specialty Exam: Physical Exam  Nursing note and vitals reviewed. Constitutional: He is oriented to person, place, and time.  HENT:  Head: Normocephalic.  Nose: Nose normal.  Mouth/Throat: Oropharynx is clear.  Eyes: Pupils are equal, round, and reactive to light.  Cardiovascular: Normal rate and normal pulses.  Respiratory: Effort normal.  Genitourinary:    Genitourinary Comments: Deferred   Musculoskeletal:        General: Normal range of motion.     Cervical back: Normal range of motion.  Neurological: He is alert and oriented to person, place, and time.  Skin: Skin is warm and dry.    Review of Systems  Constitutional: Negative.   HENT: Negative.        CTscan result: No cute problems or findings.  Eyes: Negative.   Respiratory: Negative.   Cardiovascular: Negative.   Gastrointestinal: Negative.   Endocrine: Negative.   Genitourinary: Negative.   Musculoskeletal: Negative.   Allergic/Immunologic: Positive for environmental allergies (Dust mite).  Neurological: Negative for dizziness, tremors, seizures, syncope, speech difficulty, light-headedness, numbness and headaches.  Psychiatric/Behavioral: Positive for dysphoric mood ("Improving"). Negative for agitation, behavioral problems, confusion, decreased concentration, hallucinations, self-injury, sleep disturbance and suicidal ideas. The patient is not nervous/anxious and is not hyperactive.     Blood  pressure 101/68, pulse 75, temperature 98 F (36.7 C), temperature source Oral, resp. rate 18, height 5\' 10"  (1.778 m), weight 79.8 kg, SpO2 96 %.Body mass index is 25.25 kg/m.  General Appearance: Casual  Eye Contact:  Good  Speech:  Clear and Coherent and Normal Rate  Volume:  Normal  Mood:  "Improving"  Affect:  Appropriate  Thought Process:  Coherent and Goal Directed  Orientation:  Full (Time, Place, and Person)  Thought Content:  Rumination  Suicidal Thoughts:  Denies  Homicidal Thoughts:  Denies  Memory:  Immediate;   Good Recent;   Good Remote;   Good  Judgement:  Fair  Insight:  Good  Psychomotor Activity:  Normal  Concentration:  Concentration: Good and Attention Span: Good  Recall:  Good  Fund of Knowledge:  Fair  Language:  Fair  Akathisia:  Negative  Handed:  Right  AIMS (if indicated):     Assets:  Communication Skills Desire for Improvement Physical Health  ADL's:  Intact  Cognition:  WNL  Sleep:  Number of Hours: 6.75   Treatment Plan Summary: Daily contact with patient to assess and evaluate symptoms and progress in treatment and Medication management.  - Continue inpatient hospitalization. - Will continue today 09/25/2019 plan as below except where it is noted.  Mood control.    - Continue Risperdal 0.5 mg po daily.    - Continue 2 mg po Q hs.  Depression.    - Continue Sertraline 25 mg po Q daily.  Insomnia.    - Continue 50 mg po Q hs prn.  Continue Albuterol inhaler 1-2 puffs Q 6 hrs prn. Continue ASA 81 mg po daily for heart health. Continue Lisinopril 10 mg po daily for HTN. Encourage group participation. Discharge disposition plan in progress.  Lindell Spar, NP, PMHNP, FNP-BC 09/25/2019, 3:04 PM

## 2019-09-26 MED ORDER — LISINOPRIL 10 MG PO TABS: 10.0000 mg | ORAL_TABLET | Freq: Every day | ORAL | 0 refills | Status: DC

## 2019-09-26 MED ORDER — RISPERIDONE 3 MG PO TABS
3.0000 mg | ORAL_TABLET | Freq: Every day | ORAL | Status: DC
  Filled 2019-09-26: qty 7

## 2019-09-26 MED ORDER — TRAZODONE HCL 50 MG PO TABS: 50.0000 mg | ORAL_TABLET | Freq: Every evening | ORAL | 0 refills | Status: DC | PRN

## 2019-09-26 MED ORDER — RISPERIDONE 3 MG PO TABS: 3.0000 mg | ORAL_TABLET | Freq: Every day | ORAL | 0 refills | Status: DC

## 2019-09-26 MED ORDER — POLYETHYLENE GLYCOL 3350 17 G PO PACK
17.0000 g | PACK | Freq: Every day | ORAL | Status: DC
  Administered 2019-09-26: 17 g via ORAL
  Filled 2019-09-26 (×2): qty 1

## 2019-09-26 MED ORDER — RISPERIDONE 0.5 MG PO TABS: 0.5000 mg | ORAL_TABLET | Freq: Every day | ORAL | 0 refills | Status: DC

## 2019-09-26 MED ORDER — HYDROXYZINE HCL 50 MG PO TABS
50.0000 mg | ORAL_TABLET | Freq: Three times a day (TID) | ORAL | Status: DC | PRN
  Filled 2019-09-26: qty 10

## 2019-09-26 MED ORDER — HYDROXYZINE HCL 50 MG PO TABS: 50.0000 mg | ORAL_TABLET | Freq: Three times a day (TID) | ORAL | 0 refills | Status: DC | PRN

## 2019-09-26 MED ORDER — SERTRALINE HCL 25 MG PO TABS: 25.0000 mg | ORAL_TABLET | Freq: Every day | ORAL | 0 refills | Status: DC

## 2019-09-26 MED ORDER — ASPIRIN 81 MG PO TBEC: 81.0000 mg | DELAYED_RELEASE_TABLET | Freq: Every day | ORAL | 0 refills | Status: DC

## 2019-09-26 MED ORDER — POLYETHYLENE GLYCOL 3350 17 G PO PACK: 17.0000 g | PACK | Freq: Every day | ORAL | 0 refills | Status: DC

## 2019-09-26 NOTE — Discharge Summary (Signed)
Physician Discharge Summary Note  Patient:  Casey Silva is an 64 y.o., male MRN:  [id number] DOB:  1955-07-25 Patient phone:  [phone] (home)  Patient address:   [place] [address],  Total Time spent with patient: Greater than 30 minutes  Date of Admission:  09/24/2019  Date of Discharge: 09-26-19  Reason for Admission: Worsening suicidal ideations.  Principal Problem: Brief psychotic disorder [facility])  Discharge Diagnoses: Principal Problem:   Brief psychotic disorder ([place]) Active Problems:   Psychosis ([place])  Past Psychiatric History: See H&P  Past Medical History:  Past Medical History:  Diagnosis Date   Anxiety    Depression    History reviewed. No pertinent surgical history. Family History:  Family History  Problem Relation Age of Onset   Bipolar disorder Mother    Family Psychiatric  History: See H&P  Social History:  Social History   Substance and Sexual Activity  Alcohol Use No     Social History   Substance and Sexual Activity  Drug Use No    Social History   Socioeconomic History   Marital status: Single    Spouse name: Not on file   Number of children: 1   Years of education: Not on file   Highest education level: GED or equivalent  Occupational History   Not on file  Tobacco Use   Smoking status: Never Smoker   Smokeless tobacco: Never Used  Vaping Use   Vaping Use: Unknown  Substance and Sexual Activity   Alcohol use: No   Drug use: No   Sexual activity: Not Currently  Other Topics Concern   Not on file  Social History Narrative   Marital status:  Single      Children: none      Employment:  Unemployment.  Independently wealthy; previous work for [employer].      Tobacco:  1-2 cigarettes per day since age 9.      Alcohol:  None      Drugs:  none      Exercise:  Regularly very      Religion:  Jewish   Social Determinants of Radio broadcast assistant Strain:    Difficulty of  Paying Living Expenses:   Food Insecurity:    Worried About Charity fundraiser in the Last Year:    Arboriculturist in the Last Year:   Transportation Needs:    Film/video editor (Medical):    Lack of Transportation (Non-Medical):   Physical Activity:    Days of Exercise per Week:    Minutes of Exercise per Session:   Stress:    Feeling of Stress :   Social Connections:    Frequency of Communication with Friends and Family:    Frequency of Social Gatherings with Friends and Family:    Attends Religious Services:    Active Member of Clubs or Organizations:    Attends Archivist Meetings:    Marital Status:    Hospital Course: (Per Md's admission evaluation notes): Patient is a 64 year old male with a reported past psychiatric history of anxiety, depression and insomnia who presented to the [facility] emergency department on 09/24/2019 with suicidal ideation. The patient stated that he has had a multiyear history of problems. He stated that he has been anxious, impulsive, and having severe insomnia for many years. He went into gross detail about jobs that he had that were successful and that he  would leave abruptly. He discussed his repeated cycles of insomnia. He stated that he had been placed on medicines like clonazepam and paroxetine in the past for anxiety and sleep issues. He never remain on these medications long-term. He denied any previous psychiatric evaluations, psychiatric hospitalizations or psychiatric treatment from a psychiatrist. He stated that he had a family history of bipolar disorder. He stated his mother was bipolar. During the interview he used several words of jargon to describe his symptoms, and was at times very difficult to get him to put them into his own words. He stated that most recently that he had had several days of worsening insomnia. He stated that his insomnia had gotten to the point that he would "hear  music playing", and as well see shadows and other figures. He attributed his worsening depression and anxiety to his current living situation. He stated he had been in a boardinghouse for several years, and he admitted that the other cohabitant's were not of the highest quality of people. He also mentions seeing roaches and ants in the house, and it is unclear whether or not this was true, or whether this may have been related to psychosis. He has had multiple jobs over the years. 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 He had conflict with one of his coworkers over the coworker wanting a more significant relationship. He also felt as though that the staff been at home watched inappropriate movies containing violence and offensive language. He ended up leaving that job on 24 May. He denied any drugs or alcohol. He denied any previous treatment for symptoms as this. He stated this was the first time that he had ever gotten so bad that he heard things or saw things. He denied any episodes of euphoria or excessive spending. He did admit to impulsivity and depressive episodes. Primarily his major issue was anxiety. He was very somatic during the interview. I discussed headache pain, tooth problems, "my gastrointestinal system shutting down", decreased appetite. He was admitted to the hospital for evaluation and stabilization.  After the above admission evaluation, Tyner's presenting symptoms were noted. He was recommended for mood stabilization treatments. The medication regimen targeting those presenting symptoms were discussed with him & initiated with his consent. He was medicated, stabilized & discharged on the medications as listed on his discharge medication lists below. Besides the mood stabilization treatments, Griffon was also enrolled & participated in the group counseling sessions being offered & held on this unit. He learned coping skills. He also presented other  significant pre-existing medical issues that required treatment. He was resumed & discharged on all his pertinent home medications for those health issues. He tolerated his treatment regimen without any adverse effects or reactions reported.  Jahlen's symptoms responded well to his treatment regimen. This is evidenced by his daily reports of improved mood, presentation of good affect & absence of suicidal thoughts. He is currently mentally & medically stable to be discharged to continue mental health care & medication management on an outpatient basis. During the course of his hospitalization, the 15-minute checks were adequate to ensure Riel's safety.  Patient did not display any dangerous violent or suicidal behavior on the unit.  He interacted with patients & staff appropriately, participated appropriately in the group sessions/therapies. His medications were addressed & adjusted to meet his needs. He was recommended for outpatient follow-up care & medication management upon discharge to assure continuity of care & mood stability.  At the  time of discharge patient is not reporting any acute suicidal/homicidal ideations. He feels more confident about his self-care & in managing the suicidal ideations. He currently denies any new issues or concerns. Education and supportive counseling provided throughout his hospital stay & upon discharge.  Today upon his discharge evaluation with the attending psychiatrist, Zeven shares he is doing well. He denies any other specific concerns. He is sleeping well. His appetite is good. He denies other physical complaints. He denies AH/VH. he feels that his medications have been helpful & is in agreement to continue his current treatment regimen. He was able to engage in safety planning including plan to return to [facility] or contact emergency services if he feels unable to maintain his own safety or the safety of others. Pt had no further questions, comments, or concerns. He left [facility]  with all personal belongings in no apparent distress. Transportation per the safe Government social research officer.  Physical Findings: AIMS:  , ,  ,  ,    CIWA:    COWS:     Musculoskeletal: Strength & Muscle Tone: within normal limits Gait & Station: normal Patient leans: N/A  Psychiatric Specialty Exam: Physical Exam  Nursing note and vitals reviewed. Constitutional: He is oriented to person, place, and time.  Non-toxic appearance. He does not appear ill. No distress.  HENT:  Head: Normocephalic.  Nose: Nose normal.  Mouth/Throat: Oropharynx is clear.  Eyes: Pupils are equal, round, and reactive to light.  Cardiovascular: Normal rate and normal pulses.  Respiratory: No respiratory distress. He has no wheezes. He exhibits no tenderness.  Genitourinary:    Genitourinary Comments: Deferred   Musculoskeletal:        General: Normal range of motion.     Cervical back: Normal range of motion.  Neurological: He is alert and oriented to person, place, and time.  Skin: Skin is warm and dry. He is not diaphoretic.    Review of Systems  Constitutional: Negative for chills, diaphoresis and fever.  HENT: Negative for congestion, rhinorrhea, sneezing and sore throat.   Eyes: Negative for discharge.  Respiratory: Negative for apnea.   Cardiovascular: Negative for chest pain and palpitations.  Gastrointestinal: Negative for diarrhea, nausea and vomiting.  Endocrine: Negative for cold intolerance.  Genitourinary: Negative for difficulty urinating.  Musculoskeletal: Negative for arthralgias and myalgias.  Allergic/Immunologic: Positive for environmental allergies (Dust mite, Pollen).       Allergies: NKDA  Neurological: Negative for dizziness, tremors, seizures, syncope, speech difficulty, weakness, light-headedness and headaches.  Psychiatric/Behavioral: Positive for dysphoric mood, hallucinations (hx. of (Stabilized with medication prior to discharge) and sleep disturbance (Stabilized with  medication prior to discharge). Negative for agitation, behavioral problems, confusion, decreased concentration (Stabilized with medication prior to discharge), self-injury and suicidal ideas. The patient is not nervous/anxious (Stable) and is not hyperactive.     Blood pressure 118/84, pulse 75, temperature 98 F (36.7 C), temperature source Oral, resp. rate 18, height 5\' 10"  (1.778 m), weight 79.8 kg, SpO2 96 %.Body mass index is 25.25 kg/m.  See Md's discharge SRA  Sleep:  Number of Hours: 6.5   Has this patient used any form of tobacco in the last 30 days? (Cigarettes, Smokeless Tobacco, Cigars, and/or Pipes): N/A  Blood Alcohol level:  Lab Results  Component Value Date   ETH <10 39/76/7341   Metabolic Disorder Labs:  No results found for: HGBA1C, MPG No results found for: PROLACTIN No results found for: CHOL, TRIG, HDL, CHOLHDL, VLDL, LDLCALC  See Psychiatric Specialty Exam  and Suicide Risk Assessment completed by Attending Physician prior to discharge.  Discharge destination:  Home  Is patient on multiple antipsychotic therapies at discharge:  No   Has Patient had three or more failed trials of antipsychotic monotherapy by history:  No  Recommended Plan for Multiple Antipsychotic Therapies: NA  Allergies as of 09/26/2019      Reactions   Dust Mite Mixed Allergen Ext [mite (d. Farinae)] Shortness Of Breath   Bee Pollen    Watery Eyes      Medication List    STOP taking these medications   clonazePAM 1 MG tablet Commonly known as: KLONOPIN   IBU 400 MG tablet Generic drug: ibuprofen   Multivitamin Adult Tabs   NF FORMULAS TESTOSTERONE PO   PARoxetine 20 MG tablet Commonly known as: PAXIL     TAKE these medications     Indication  albuterol 108 (90 Base) MCG/ACT inhaler Commonly known as: VENTOLIN HFA Inhale 2 puffs into the lungs every 6 (six) hours as needed for wheezing or shortness of breath.  Indication: Chronic Obstructive Lung Disease   aspirin 81  MG EC tablet Take 1 tablet (81 mg total) by mouth daily. (May buy from over the counter,Swallow whole): For heart health. Start taking on: September 27, 2019  Indication: For heart health   hydrOXYzine 50 MG tablet Commonly known as: ATARAX/VISTARIL Take 1 tablet (50 mg total) by mouth 3 (three) times daily as needed for itching or anxiety.  Indication: Feeling Anxious   lisinopril 10 MG tablet Commonly known as: ZESTRIL Take 1 tablet (10 mg total) by mouth daily. For high blood pressure Start taking on: September 27, 2019  Indication: High Blood Pressure Disorder   polyethylene glycol 17 g packet Commonly known as: MIRALAX / GLYCOLAX Take 17 g by mouth daily. (May buy from over the counter): For constipation  Indication: Constipation   risperiDONE 3 MG tablet Commonly known as: RISPERDAL Take 1 tablet (3 mg total) by mouth at bedtime. For mood control  Indication: Mood control   risperiDONE 0.5 MG tablet Commonly known as: RISPERDAL Take 1 tablet (0.5 mg total) by mouth daily. For mood control Start taking on: September 27, 2019  Indication: Mood control   sertraline 25 MG tablet Commonly known as: ZOLOFT Take 1 tablet (25 mg total) by mouth daily. For depression Start taking on: September 27, 2019  Indication: Major Depressive Disorder   traZODone 50 MG tablet Commonly known as: DESYREL Take 1 tablet (50 mg total) by mouth at bedtime as needed for sleep.  Indication: [place]. Go on 10/02/2019.   Specialty: Behavioral Health Why: You have an appointment for therapy on 10/02/19 at 9:00 am.  This appointment will be held in person.  Please arrive at 8:30 am to complete paperwork.  You also have an appointment for medication management on 10/20/19 at 10:30 am, in person. Contact information: [place] [phone]             Follow-up recommendations:  Activity:  As  tolerated Diet: As recommended by your primary care doctor. Keep all scheduled follow-up appointments as recommended.  Comments: Prescriptions given at discharge.  Patient agreeable to plan.  Given opportunity to ask questions.  Appears to feel comfortable with discharge denies any current suicidal or homicidal thought. Patient is also instructed prior to discharge to: Take all medications as prescribed by  his/her mental healthcare provider. Report any adverse effects and or reactions from the medicines to his/her outpatient provider promptly. Patient has been instructed & cautioned: To not engage in alcohol and or illegal drug use while on prescription medicines. In the event of worsening symptoms, patient is instructed to call the crisis hotline, 911 and or go to the nearest ED for appropriate evaluation and treatment of symptoms. To follow-up with his/her primary care provider for your other medical issues, concerns and or health care needs.  Signed: Lindell Spar, NP, PMHNP, FNP-BC 09/26/2019, 10:15 AM

## 2019-09-26 NOTE — Plan of Care (Signed)
Discharge note  Patient verbalizes readiness for discharge. Follow up plan explained, AVS, Transition record and SRA given. Prescriptions and teaching provided. Belongings returned and signed for. Suicide safety plan completed and signed. Patient verbalizes understanding. Patient denies SI/HI and assures this Probation officer they will seek assistance should that change. Patient discharged to lobby to wait for Lyft.  Problem: Education: Goal: Knowledge of  General Education information/materials will improve Outcome: Adequate for Discharge Goal: Emotional status will improve Outcome: Adequate for Discharge Goal: Mental status will improve Outcome: Adequate for Discharge Goal: Verbalization of understanding the information provided will improve Outcome: Adequate for Discharge   Problem: Activity: Goal: Interest or engagement in activities will improve Outcome: Adequate for Discharge Goal: Sleeping patterns will improve Outcome: Adequate for Discharge   Problem: Coping: Goal: Ability to verbalize frustrations and anger appropriately will improve Outcome: Adequate for Discharge Goal: Ability to demonstrate self-control will improve Outcome: Adequate for Discharge   Problem: Health Behavior/Discharge Planning: Goal: Identification of resources available to assist in meeting health care needs will improve Outcome: Adequate for Discharge Goal: Compliance with treatment plan for underlying cause of condition will improve Outcome: Adequate for Discharge   Problem: Physical Regulation: Goal: Ability to maintain clinical measurements within normal limits will improve Outcome: Adequate for Discharge   Problem: Safety: Goal: Periods of time without injury will increase Outcome: Adequate for Discharge   Problem: Education: Goal: Ability to make informed decisions regarding treatment will improve Outcome: Adequate for Discharge   Problem: Coping: Goal: Coping ability will  improve Outcome: Adequate for Discharge   Problem: Health Behavior/Discharge Planning: Goal: Identification of resources available to assist in meeting health care needs will improve Outcome: Adequate for Discharge   Problem: Medication: Goal: Compliance with prescribed medication regimen will improve Outcome: Adequate for Discharge   Problem: Self-Concept: Goal: Ability to disclose and discuss suicidal ideas will improve Outcome: Adequate for Discharge Goal: Will verbalize positive feelings about self Outcome: Adequate for Discharge   Problem: Education: Goal: Ability to state activities that reduce stress will improve Outcome: Adequate for Discharge   Problem: Coping: Goal: Ability to identify and develop effective coping behavior will improve Outcome: Adequate for Discharge   Problem: Self-Concept: Goal: Ability to identify factors that promote anxiety will improve Outcome: Adequate for Discharge Goal: Level of anxiety will decrease Outcome: Adequate for Discharge Goal: Ability to modify response to factors that promote anxiety will improve Outcome: Adequate for Discharge

## 2019-09-26 NOTE — Progress Notes (Signed)
Adult Psychoeducational Group Note  Date:  09/26/2019 Time:  4:49 AM  Group Topic/Focus:  Wrap-Up Group:   The focus of this group is to help patients review their daily goal of treatment and discuss progress on daily workbooks.  Participation Level:  Active  Participation Quality:  Appropriate  Affect:  Appropriate  Cognitive:  Appropriate  Insight: Appropriate  Engagement in Group:  Engaged  Modes of Intervention:  Discussion  Additional Comments:  Pt attend wrap up group. His day was a 5. His goal for today was to stabilize he achieve his goal. Coping skills was talking.  Lenice Llamas Long 09/26/2019, 4:49 AM

## 2019-09-26 NOTE — Progress Notes (Signed)
  [facility] Adult Case Management Discharge Plan :  Will you be returning to the same living situation after discharge:  Yes,  patient is returning home to his boarding home At discharge, do you have transportation home?: Yes,  CSW arranging Safe Transport ([place]) Do you have the ability to pay for your medications: No.  Release of information consent forms completed and in the chart;  Patient's signature needed at discharge.  Patient to Follow up at:  [place]. Go on 10/02/2019.   Specialty: Behavioral Health Why: You have an appointment for therapy on 10/02/19 at 9:00 am.  This appointment will be held in person.  Please arrive at 8:30 am to complete paperwork.  You also have an appointment for medication management on 10/20/19 at 10:30 am, in person. Contact information: [place] [phone]              Next level of care provider has access to [place] and Suicide Prevention discussed: Yes,  with the patient     Has patient been referred to the Quitline?: N/A patient is not a smoker  Patient has been referred for addiction treatment: N/A  Marylee Floras, [place] 09/26/2019, 9:53 AM

## 2019-09-26 NOTE — BHH Suicide Risk Assessment (Signed)
[facility] Discharge Suicide Risk Assessment   Principal Problem: Brief psychotic disorder [facility]) Discharge Diagnoses: Principal Problem:   Brief psychotic disorder ([place]) Active Problems:   Psychosis ([place])   Total Time spent with patient: 15 minutes  Musculoskeletal: Strength & Muscle Tone: within normal limits Gait & Station: normal Patient leans: N/A  Psychiatric Specialty Exam: Review of Systems  Gastrointestinal: Positive for constipation.  All other systems reviewed and are negative.   Blood pressure 108/86, pulse 72, temperature 98 F (36.7 C), temperature source Oral, resp. rate 18, height 5\' 10"  (1.778 m), weight 79.8 kg, SpO2 96 %.Body mass index is 25.25 kg/m.  General Appearance: Casual  Eye Contact::  Fair  Speech:  Normal Rate409  Volume:  Normal  Mood:  Anxious  Affect:  Congruent  Thought Process:  Coherent and Descriptions of Associations: Intact  Orientation:  Full (Time, Place, and Person)  Thought Content:  Rumination  Suicidal Thoughts:  No  Homicidal Thoughts:  No  Memory:  Immediate;   Fair Recent;   Fair Remote;   Fair  Judgement:  Intact  Insight:  Fair  Psychomotor Activity:  Increased  Concentration:  Good  Recall:  Good  Fund of Knowledge:Good  Language: Good  Akathisia:  Negative  Handed:  Right  AIMS (if indicated):     Assets:  Desire for Improvement Resilience  Sleep:  Number of Hours: 6.5  Cognition: WNL  ADL's:  Intact   Mental Status Per Nursing Assessment::   On Admission:  Self-harm thoughts, Suicidal ideation indicated by patient  Demographic Factors:  Male, Divorced or widowed, Caucasian, Low socioeconomic status, Living alone and Unemployed  Loss Factors: Financial problems/change in socioeconomic status  Historical Factors: Impulsivity  Risk Reduction Factors:   Positive coping skills or problem solving skills  Continued Clinical Symptoms:  Severe Anxiety and/or Agitation Obsessive-Compulsive Disorder More than  one psychiatric diagnosis  Cognitive Features That Contribute To Risk:  Thought constriction (tunnel vision)    Suicide Risk:  Minimal: No identifiable suicidal ideation.  Patients presenting with no risk factors but with morbid ruminations; may be classified as minimal risk based on the severity of the depressive symptoms   Follow-up [place]. Go on 10/02/2019.   Specialty: Behavioral Health Why: You have an appointment for therapy on 10/02/19 at 9:00 am.  This appointment will be held in person.  Please arrive at 8:30 am to complete paperwork.  You also have an appointment for medication management on 10/20/19 at 10:30 am, in person. Contact information: [place] [phone]              Plan Of Care/Follow-up recommendations:  Activity:  ad lib  Sharma Covert, MD 09/26/2019, 7:58 AM

## 2019-10-02 ENCOUNTER — Other Ambulatory Visit: Payer: Self-pay

## 2019-10-02 ENCOUNTER — Ambulatory Visit (INDEPENDENT_AMBULATORY_CARE_PROVIDER_SITE_OTHER): Payer: No Payment, Other | Admitting: Licensed Clinical Social Worker

## 2019-10-02 DIAGNOSIS — F23 Brief psychotic disorder: Secondary | ICD-10-CM | POA: Diagnosis not present
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
## 2019-10-02 NOTE — Patient Instructions (Signed)
Psychosis Psychosis, also called thought disturbance, refers to a severe loss of contact with reality. People having a psychotic episode are not able to think clearly, and their emotions and responses do not match with what is actually happening. People having a psychotic episode may have false beliefs about what is happening or who they are (delusions). They may see, hear, taste, smell, or feel things that are not present (hallucinations). They may also be very upset (agitated), have chaotic behavior, or be very quiet and withdrawn. What are the causes? This condition may be caused by:  Very serious mental health (psychiatric) conditions such as schizophrenia, bipolar disorder, or major depression.  Use of drugs such as hallucinogens or alcohol.  Medical conditions such as delirium or neurological disorders. What are the signs or symptoms? Symptoms of this condition include:  Delusions, such as: ? Feeling a lot of fear or suspicion (paranoia). ? Believing something that is odd, unrealistic, or false, such as believing that you are someone else.  Hallucinations, such as: ? Hearing or seeing things, smelling odors, experiencing tastes, or feeling bodily sensations. ? Command hallucinations that direct you to do something that could be dangerous.  Disorganized thinking, such as thoughts that jump from one idea to another in a way that does not make sense.  Disorganized speech, such as saying things that do not make sense, echoing others, or using words based on their sound rather than their meaning.  Inappropriate behavior, such as talking to yourself, showing a clear increase or decrease in activity, or intruding on unfamiliar people. How is this diagnosed? This condition is diagnosed based on an assessment by a health care provider.  The health care provider may ask questions about: ? Your thoughts, feelings, and behavior. ? Any medical conditions you have. ? Any use of alcohol or  drugs.  One or more of the following may also be done: ? A physical exam. ? Blood tests. ? Brain imaging, such as a CT scan or MRI. ? A brain wave study (electroencephalogram, or EEG). The health care provider may refer you to a mental health professional for further tests. How is this treated? Treatment for this condition may depend on the cause of the psychosis. Treatment may include one or more of the following:  Supportive care and monitoring in the emergency room or hospital. You may need to stay in the hospital if you are a danger to yourself or others.  Taking antipsychotic medicines to reduce symptoms and to balance chemicals in the brain.  Treating an underlying medical condition.  Stopping or reducing drugs that are causing psychosis.  Therapy and other supportive programs, such as: ? Ongoing treatment and care from a mental health professional. ? Individual or family therapy. ? Training to learn new skills to cope with the psychosis and prevent further episodes. Follow these instructions at home:  Take over-the-counter and prescription medicines only as told by your health care provider.  Consult a health care provider before taking over-the-counter medicines, herbs, or supplements.  Surround yourself with people who care about you and can help manage your condition.  Keep stress under control. Stress may trigger psychosis and make symptoms worse.  Maintain a healthy lifestyle. This includes: ? Eating a healthy diet. ? Getting enough sleep. ? Exercising regularly. ? Avoiding alcohol, nicotine, and recreational drugs.  Keep all follow-up visits as told by your health care provider. This is important. Contact a health care provider if:  Medicines do not seem to be helping.  You or others notice that you: ? Continue to see, smell, or feel things that are not there. ? Hear voices telling you to do things. ? Feel extremely fearful and suspicious that someone or  something will harm you. ? Feel unable to leave your house. ? Have trouble taking care of yourself.  You have side effects of medicines, such as: ? Changes in sleep patterns. ? Dizziness. ? Weight gain. ? Restlessness. ? Movement changes. ? Shaking that you cannot control (tremors). Get help right away if:  You have serious side effects of medicine, such as: ? Swelling of the face, lips, tongue, or throat. ? Fever, confusion, muscle spasms, or seizures.  You have serious thoughts about harming yourself or hurting others. If you ever feel like you may hurt yourself or others, or have thoughts about taking your own life, get help right away. You can go to your nearest emergency department or call:  Your local emergency services (911 in the [place]  A suicide crisis helpline, such as the [facility] at 316-220-5054. This is open 24 hours a day. Summary  Psychosis refers to a severe loss of contact with reality. People having a psychotic episode are not able to think clearly, and they may have delusions or hallucinations.  Psychosis is a serious medical condition that should be treated by a medical professional as soon as possible. Being checked and treated right away can stop or reduce symptoms. This prevents more serious problems from developing.  In some cases, treatment may include taking antipsychotic medicines to reduce symptoms and to balance chemicals in the brain.  Support programs may help you learn new skills to cope with the psychosis and prevent further episodes. This information is not intended to replace advice given to you by your health care provider. Make sure you discuss any questions you have with your health care provider. Document Revised: 06/08/2017 Document Reviewed: 05/08/2017 Elsevier Patient Education  2020 [employer].

## 2019-10-20 ENCOUNTER — Encounter (HOSPITAL_COMMUNITY): Payer: Self-pay | Admitting: Psychiatry

## 2019-10-20 ENCOUNTER — Other Ambulatory Visit: Payer: Self-pay

## 2019-10-20 ENCOUNTER — Ambulatory Visit (INDEPENDENT_AMBULATORY_CARE_PROVIDER_SITE_OTHER): Payer: No Payment, Other | Admitting: Psychiatry

## 2019-10-20 DIAGNOSIS — F5101 Primary insomnia: Secondary | ICD-10-CM | POA: Diagnosis not present

## 2019-10-20 DIAGNOSIS — F332 Major depressive disorder, recurrent severe without psychotic features: Secondary | ICD-10-CM | POA: Diagnosis not present

## 2019-10-20 MED ORDER — HYDROXYZINE HCL 50 MG PO TABS: 50.0000 mg | ORAL_TABLET | Freq: Three times a day (TID) | ORAL | 0 refills | Status: DC | PRN

## 2019-10-20 MED ORDER — MIRTAZAPINE 15 MG PO TABS: 15.0000 mg | ORAL_TABLET | Freq: Every day | ORAL | 1 refills | Status: DC

## 2019-10-20 MED ORDER — TRAZODONE HCL 100 MG PO TABS: 100.0000 mg | ORAL_TABLET | Freq: Every evening | ORAL | 1 refills | Status: DC | PRN

## 2019-10-20 NOTE — Progress Notes (Signed)
Psychiatric Initial Adult Assessment   Patient Identification: Casey Silva MRN:  [id number] Date of Evaluation:  10/20/2019 Referral Source: [facility] Chief Complaint:  " I am good at masking what is going on.  It is like I want autopilot and I am already dead" Visit Diagnosis:    ICD-10-CM   1. Severe episode of recurrent major depressive disorder, without psychotic features (HCC)  F33.2 mirtazapine (REMERON) 15 MG tablet    hydrOXYzine (ATARAX/VISTARIL) 50 MG tablet    DISCONTINUED: hydrOXYzine (ATARAX/VISTARIL) 50 MG tablet  2. Primary insomnia  F51.01 traZODone (DESYREL) 100 MG tablet    mirtazapine (REMERON) 15 MG tablet    History of Present Illness: 64 year old male seen today for initial psychiatric evaluation.  Patient was referred to outpatient psychiatry by  [facility] where he was seen on 09/23/2019 for SI with plan to overdose hydrocodone.  Patient notes that he has been struggling with untreated anxiety and depression for a long time.  During hospitalization patient was prescribed Risperdal 0.5 daily, Risperdal 0.5 at bedtime, Zoloft 25 mg daily, trazodone 50 mg at bedtime, and hydroxyzine 50 mg 3 times daily.  He notes that since his discharge he is not taken Zoloft or Risperdal.  He informed Probation officer that he has difficulty remembering how and when to take the medications noting he becomes overwhelming. Today he reports that he is concerned about his sleeping.  Today patient endorsed symptoms of depression such as anhedonia noting that it is difficult for him to shower, get to work, or find pleasure in activities such as eating.  He reports that he has trouble sleeping noting that at times he only sleeps 3 hours a night.  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  He notes that this change in schedule has worsened his insomnia.  He informed provider that he has difficulty concentrating and is hopeless about  the future.  Today he endorses passive suicidal thoughts and states that he will be okay God allowed him to die.  He denies wanting to harm himself at this time and contracts for safety.    He notes that he also has problems eating which he relates to getting the dentures and having a difficult time eating.  At times he reports that he is distractible, irritable, and have racing thoughts about past decisions, maintaining his job, and paying his bills.  He informed Probation officer that he is regretful that he sought treatment from [facility] because he now has a debt of $4300 that he reports that he excessively worries about.  He feels like he will lose his job in the near future as he notes that it is becoming difficult for him to correctly document and care for his patients.  Patient reports that he would like to live in an assisted facility where he can receive help with activities of daily living.  Provider gave patient resources to local services in the area.  Patient reports that he has problems taking medications several times a day.  He is agreeable to starting Remeron 15 mg at bedtime, increasing trazodone 50mg  to 100 mg at bedtime, and continue all other medications as prescribed.  Potential side effects of medication and risks vs benefits of treatment vs non-treatment were explained and discussed. All questions were answered. 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  Consequences of Substance Abuse: NA  Past Medical History:  Past Medical History:  Diagnosis Date  . Anxiety   . Depression    History reviewed. No pertinent surgical history.  Family Psychiatric History: Mother bipolar depression   Family History:  Family History  Problem Relation Age of Onset  . Bipolar disorder Mother     Social History:   Social History   Socioeconomic History  . Marital status: Single    Spouse name: Not on file  . Number of children: 1  . Years of education: Not on file  . Highest education level: GED or equivalent  Occupational History  . Not on file  Tobacco Use  . Smoking status: Never Smoker  . Smokeless tobacco: Never Used  Vaping Use  . Vaping Use: Unknown  Substance and Sexual Activity  . Alcohol use: No  . Drug use: No  . Sexual activity: Not Currently  Other Topics Concern  . Not on file  Social History Narrative   Marital status:  Single      Children: none      Employment:  Unemployment.  Independently wealthy; previous work for [employer].      Tobacco:  1-2 cigarettes per day since age 64.      Alcohol:  None      Drugs:  none      Exercise:  Regularly very      Religion:  Jewish   Social Determinants of Health   Financial Resource Strain: Medium Risk  . Difficulty of Paying Living Expenses: Somewhat hard  Food Insecurity: No Food Insecurity  . Worried About Charity fundraiser in the Last Year: Never true  . Ran Out of Food in the Last Year: Never true  Transportation Needs: No Transportation Needs  . Lack of Transportation (Medical): No  . Lack of Transportation (Non-Medical): No  Physical Activity: Insufficiently Active  . Days of Exercise per Week: 2 days  . Minutes of Exercise per Session: 30 min  Stress: Stress Concern Present  . Feeling of Stress : Rather much  Social Connections: Socially Isolated  . Frequency of Communication with Friends and Family: Never  . Frequency of Social Gatherings  with Friends and Family: Never  . Attends Religious Services: Never  . Active Member of Clubs or Organizations: No  . Attends Archivist Meetings: Never  . Marital Status: Divorced    Additional Social History: Patient is single and has no children.  He currently resides in [place] in a boarding home.  He denies alcohol, tobacco, or illicit drug use.  He currently works as a Environmental education officer at  [employer].  Allergies:   Allergies  Allergen Reactions  . Dust Mite Mixed Allergen Ext [Mite (D. Farinae)] Shortness Of Breath  . Bee Pollen     Watery Eyes    Metabolic Disorder Labs: No results found for: HGBA1C, MPG No results found for: PROLACTIN No results found for: CHOL, TRIG, HDL, CHOLHDL, VLDL, LDLCALC Lab Results  Component Value Date   TSH 1.267 09/24/2019    Therapeutic Level Labs: No  results found for: LITHIUM No results found for: CBMZ No results found for: VALPROATE  Current Medications: Current Outpatient Medications  Medication Sig Dispense Refill  . albuterol (VENTOLIN HFA) 108 (90 Base) MCG/ACT inhaler Inhale 2 puffs into the lungs every 6 (six) hours as needed for wheezing or shortness of breath.     Marland Kitchen aspirin EC 81 MG EC tablet Take 1 tablet (81 mg total) by mouth daily. (May buy from over the counter,Swallow whole): For heart health. 30 tablet 0  . hydrOXYzine (ATARAX/VISTARIL) 50 MG tablet Take 1 tablet (50 mg total) by mouth 3 (three) times daily as needed for itching or anxiety. 75 tablet 0  . lisinopril (ZESTRIL) 10 MG tablet Take 1 tablet (10 mg total) by mouth daily. For high blood pressure 30 tablet 0  . mirtazapine (REMERON) 15 MG tablet Take 1 tablet (15 mg total) by mouth at bedtime. 30 tablet 1  . polyethylene glycol (MIRALAX / GLYCOLAX) 17 g packet Take 17 g by mouth daily. (May buy from over the counter): For constipation 1 each 0  . sertraline (ZOLOFT) 25 MG tablet Take 1 tablet (25 mg total) by mouth daily. For depression 30  tablet 0  . traZODone (DESYREL) 100 MG tablet Take 1 tablet (100 mg total) by mouth at bedtime as needed for sleep. 30 tablet 1   No current facility-administered medications for this visit.    Musculoskeletal: Strength & Muscle Tone: within normal limits Gait & Station: normal Patient leans: N/A  Psychiatric Specialty Exam: Review of Systems  There were no vitals taken for this visit.There is no height or weight on file to calculate BMI.  General Appearance: Well Groomed  Eye Contact:  Good  Speech:  Clear and Coherent and Normal Rate  Volume:  Normal  Mood:  Depressed  Affect:  Congruent  Thought Process:  Coherent, Goal Directed and Linear  Orientation:  Full (Time, Place, and Person)  Thought Content:  WDL and Logical  Suicidal Thoughts:  No  Homicidal Thoughts:  Yes.  without intent/plan  Memory:  Immediate;   Good Recent;   Good Remote;   Good  Judgement:  Fair  Insight:  Fair  Psychomotor Activity:  Normal  Concentration:  Concentration: Good and Attention Span: Good  Recall:  Good  Fund of Knowledge:Good  Language: Good  Akathisia:  No  Handed:  Right  AIMS (if indicated):  Not done  Assets:  Communication Skills Desire for Improvement Financial Resources/Insurance Housing  ADL's:  Intact  Cognition: WNL  Sleep:  Poor   Screenings: AUDIT     Admission (Discharged) from 09/24/2019 in [place] [facility]  Alcohol Use Disorder Identification Test Final Score (AUDIT) 0    PHQ2-9     Counsel[facility] from [facility] in [facility]  PHQ-2 Total Score 5  PHQ-9 Total Score 17      Assessment and Plan: Patient reports that he is having insomnia and increased depression.  He notes that discharge he did not take his medications as prescribed.  He is agreeable to continuing hydroxyzine 50 mg 3 times daily, starting Remeron 15 mg at bedtime, and increasing trazodone 50 to grams at bedtime to help improve sleep.  1.  Severe episode of recurrent major depressive disorder, without psychotic features ([place])  Start- mirtazapine (REMERON) 15 MG tablet; Take 1 tablet (15 mg total) by mouth at bedtime.  Dispense: 30 tablet; Refill: 1 Continue- hydrOXYzine (ATARAX/VISTARIL) 50 MG tablet; Take 1 tablet (50  mg total) by mouth 3 (three) times daily as needed for itching or anxiety.  Dispense: 75 tablet; Refill: 0  2. Primary insomnia  Increased- traZODone (DESYREL) 100 MG tablet; Take 1 tablet (100 mg total) by mouth at bedtime as needed for sleep.  Dispense: 30 tablet; Refill: 1 Start- mirtazapine (REMERON) 15 MG tablet; Take 1 tablet (15 mg total) by mouth at bedtime.  Dispense: 30 tablet; Refill: 1    Salley Slaughter, NP 7/12/202111:35 AM

## 2019-10-21 ENCOUNTER — Ambulatory Visit (HOSPITAL_COMMUNITY): Payer: Self-pay | Admitting: Licensed Clinical Social Worker

## 2019-12-04 ENCOUNTER — Other Ambulatory Visit: Payer: Self-pay

## 2019-12-04 ENCOUNTER — Ambulatory Visit (INDEPENDENT_AMBULATORY_CARE_PROVIDER_SITE_OTHER): Payer: No Payment, Other | Admitting: Psychiatry

## 2019-12-04 ENCOUNTER — Encounter (HOSPITAL_COMMUNITY): Payer: Self-pay | Admitting: Psychiatry

## 2019-12-04 DIAGNOSIS — F332 Major depressive disorder, recurrent severe without psychotic features: Secondary | ICD-10-CM | POA: Diagnosis not present

## 2019-12-04 DIAGNOSIS — F5101 Primary insomnia: Secondary | ICD-10-CM

## 2019-12-04 MED ORDER — MIRTAZAPINE 30 MG PO TABS: 30.0000 mg | ORAL_TABLET | Freq: Every day | ORAL | 2 refills | Status: DC

## 2019-12-04 MED ORDER — QUETIAPINE FUMARATE 100 MG PO TABS: 100.0000 mg | ORAL_TABLET | Freq: Every day | ORAL | 2 refills | Status: DC

## 2019-12-04 MED ORDER — TRAZODONE HCL 100 MG PO TABS: 100.0000 mg | ORAL_TABLET | Freq: Every evening | ORAL | 2 refills | Status: DC | PRN

## 2019-12-04 MED ORDER — HYDROXYZINE HCL 50 MG PO TABS: 50.0000 mg | ORAL_TABLET | Freq: Three times a day (TID) | ORAL | 2 refills | Status: DC | PRN

## 2019-12-04 NOTE — Progress Notes (Signed)
BH MD/PA/NP OP Progress Note  12/04/2019 1:42 PM Casey Silva  MRN:  [id number]  Chief Complaint: "Things are still very rough"  HPI: 64 year old male seen today for followup psychiatric evaluation.  He has a psychiatric history of anxiety, depression, insomnia, brief psychotic disorder and psychosis.   He is currently being managed on hydroxyzine 50 mg three times daily PRN, Remeron 15 mg HS, and Trazodone 100 mg nightly. Today he reports that his depression has not improved from last visit.    On exam patient is pleasant, calm, cooperative, had poor eye contact and was well groomed.  His affect is flat, his speech is slow, and the volume is low. Patient endorsed symptoms of depression such as anhedonia noting that it is difficult for him to shower, get to work, or find pleasure in activities such as eating.  He reports that his sleep has improved however notes that on most nights he sleeps less than five hours.  His work shifts still interfere with his sleep.  Patient informed provider that he has difficulty concentrating and is hopeless about the future.  Today he endorses passive suicidal thoughts and states that he would be okay if he died in his sleep of natural causes.  He denies wanting to harm himself at this time and contracts for safety.  She reports that his current living situation is exacerbating his depression.  He notes that he dislikes the boardinghouse where he lives.  He reports that there are roaches and broken utilities such as the shower.   Patient states that he would like to go back to a life where he was happy.  He notes that in June when he fell and hit his head his perspective on life change.  He notes that he constantly thinks the point of life and the point of work.  He states he feels as if he is losing his mind because at times he remembers things that did not occur.  States I just do not understand how my brain is working noting when he was homeless and living in [place] he was more happy.  He informed Probation officer that he just feels lost because life is no longer fun anymore and notes that he feels lonely.  He reports that he is physically and mentally deteriorating noting that his vision is beginning to blur.   Today patient endorses visual and auditory hallucinations.  He notes that at times he sees shadows, or figures on the floor that looks like roaches that are not there.  He also notes that at times he feels like something is crawling on his skin.  He informed provider that he would just like to live in a place where some of his needs can be met such as an assisted living facility.  Patient notes that he has not utilize the resources given to him at last visit for local resources in the area.  Patient encouraged to follow-up with outpatient therapist, engaged in outdoor activities to socialize with others, and to take his medications as prescribed.   He is agreeable to starting Seroquel 100 mg at bedtime to help improve mood, sleep, and symptoms of psychosis.  He is also agreeable to increase Remeron 15 mg to 30 mg at bedtime to improve symptoms of depression and sleep.  He will continue all other medications as prescribed. Potential side effects of medication and risks vs benefits of treatment vs non-treatment were explained and discussed. All questions were answered. Patient notes  that he may follow-up with his outpatient counselor if he has the energy.  No other concerns noted at this time.   Visit Diagnosis:    ICD-10-CM   1. Severe episode of recurrent major depressive disorder, without psychotic features ([place])  F33.2 mirtazapine (REMERON) 30 MG tablet    QUEtiapine (SEROQUEL) 100 MG tablet  2. Primary insomnia  F51.01 mirtazapine (REMERON) 30 MG tablet    traZODone (DESYREL) 100 MG tablet    Past Psychiatric History: Anxiety, depression, insomnia, brief psychotic disorder and psychosis.   Past Medical History:  Past Medical History:  Diagnosis  Date  . Anxiety   . Depression    No past surgical history on file.  Family Psychiatric History: Mother bipolar depression   Family History:  Family History  Problem Relation Age of Onset  . Bipolar disorder Mother     Social History:  Social History   Socioeconomic History  . Marital status: Single    Spouse name: Not on file  . Number of children: 1  . Years of education: Not on file  . Highest education level: GED or equivalent  Occupational History  . Not on file  Tobacco Use  . Smoking status: Never Smoker  . Smokeless tobacco: Never Used  Vaping Use  . Vaping Use: Unknown  Substance and Sexual Activity  . Alcohol use: No  . Drug use: No  . Sexual activity: Not Currently  Other Topics Concern  . Not on file  Social History Narrative   Marital status:  Single      Children: none      Employment:  Unemployment.  Independently wealthy; previous work for [employer].      Tobacco:  1-2 cigarettes per day since age 63.      Alcohol:  None      Drugs:  none      Exercise:  Regularly very      Religion:  Jewish   Social Determinants of Health   Financial Resource Strain: Medium Risk  . Difficulty of Paying Living Expenses: Somewhat hard  Food Insecurity: No Food Insecurity  . Worried About Charity fundraiser in the Last Year: Never true  . Ran Out of Food in the Last Year: Never true  Transportation Needs: No Transportation Needs  . Lack of Transportation (Medical): No  . Lack of Transportation (Non-Medical): No  Physical Activity: Insufficiently Active  . Days of Exercise per Week: 2 days  . Minutes of Exercise per Session: 30 min  Stress: Stress Concern Present  . Feeling of Stress : Rather much  Social Connections: Socially Isolated  . Frequency of Communication with Friends and Family: Never  . Frequency of Social Gatherings with Friends and Family: Never  . Attends Religious Services: Never  . Active Member of Clubs or Organizations: No  .  Attends Archivist Meetings: Never  . Marital Status: Divorced    Allergies:  Allergies  Allergen Reactions  . Dust Mite Mixed Allergen Ext [Mite (D. Farinae)] Shortness Of Breath  . Bee Pollen     Watery Eyes    Metabolic Disorder Labs: No results found for: HGBA1C, MPG No results found for: PROLACTIN No results found for: CHOL, TRIG, HDL, CHOLHDL, VLDL, LDLCALC Lab Results  Component Value Date   TSH 1.267 09/24/2019    Therapeutic Level Labs: No results found for: LITHIUM No results found for: VALPROATE No components found for:  CBMZ  Current Medications: Current Outpatient Medications  Medication  Sig Dispense Refill  . albuterol (VENTOLIN HFA) 108 (90 Base) MCG/ACT inhaler Inhale 2 puffs into the lungs every 6 (six) hours as needed for wheezing or shortness of breath.     Marland Kitchen aspirin EC 81 MG EC tablet Take 1 tablet (81 mg total) by mouth daily. (May buy from over the counter,Swallow whole): For heart health. 30 tablet 0  . hydrOXYzine (ATARAX/VISTARIL) 50 MG tablet Take 1 tablet (50 mg total) by mouth 3 (three) times daily as needed for itching or anxiety. 75 tablet 0  . lisinopril (ZESTRIL) 10 MG tablet Take 1 tablet (10 mg total) by mouth daily. For high blood pressure 30 tablet 0  . mirtazapine (REMERON) 30 MG tablet Take 1 tablet (30 mg total) by mouth at bedtime. 30 tablet 2  . polyethylene glycol (MIRALAX / GLYCOLAX) 17 g packet Take 17 g by mouth daily. (May buy from over the counter): For constipation 1 each 0  . QUEtiapine (SEROQUEL) 100 MG tablet Take 1 tablet (100 mg total) by mouth at bedtime. 30 tablet 2  . traZODone (DESYREL) 100 MG tablet Take 1 tablet (100 mg total) by mouth at bedtime as needed for sleep. 30 tablet 2   No current facility-administered medications for this visit.     Musculoskeletal: Strength & Muscle Tone: within normal limits Gait & Station: normal Patient leans: N/A  Psychiatric Specialty Exam: Review of Systems  There  were no vitals taken for this visit.There is no height or weight on file to calculate BMI.  General Appearance: Well Groomed  Eye Contact:  Fair  Speech:  Clear and Coherent and Slow  Volume:  Decreased  Mood:  Anxious and Depressed  Affect:  Congruent  Thought Process:  Coherent, Goal Directed and Linear  Orientation:  Full (Time, Place, and Person)  Thought Content: Illogical and Hallucinations: Auditory Tactile   Suicidal Thoughts:  No  Homicidal Thoughts:  No  Memory:  Immediate;   Fair Recent;   Fair Remote;   Fair  Judgement:  Good  Insight:  Fair  Psychomotor Activity:  Normal  Concentration:  Concentration: Fair and Attention Span: Fair  Recall:  [employer] of Knowledge: Good  Language: Good  Akathisia:  No  Handed:  Right  AIMS (if indicated): Not done  Assets:  Communication Skills Desire for Improvement Financial Resources/Insurance Housing  ADL's:  Intact  Cognition: WNL  Sleep:  Poor   Screenings: AUDIT     Admission (Discharged) from 09/24/2019 in [place] [facility]  Alcohol Use Disorder Identification Test Final Score (AUDIT) 0    PHQ2-9     Counselor from 10/02/2019 in [facility]  PHQ-2 Total Score 5  PHQ-9 Total Score 17       Assessment and Plan: Patient endorses worsening anxiety, depression, insomnia, and passive SI. He is agreeable to starting Seroquel 100 mg at bedtime to help improve mood, sleep, and symptoms of psychosis.  He is also agreeable to increase Remeron 15 mg to 30 mg at bedtime to improve symptoms of depression and sleep.  He will continue all other medications as prescribed.   1. Severe episode of recurrent major depressive disorder, without psychotic features ([place])  Increased- mirtazapine (REMERON) 30 MG tablet; Take 1 tablet (30 mg total) by mouth at bedtime.  Dispense: 30 tablet; Refill: 2 Start- QUEtiapine (SEROQUEL) 100 MG tablet; Take 1 tablet (100 mg total) by mouth at  bedtime.  Dispense: 30 tablet; Refill: 2 Continue- hydrOXYzine (  ATARAX/VISTARIL) 50 MG tablet; Take 1 tablet (50 mg total) by mouth 3 (three) times daily as needed for itching or anxiety.  Dispense: 90 tablet; Refill: 2  2. Primary insomnia  Increased- mirtazapine (REMERON) 30 MG tablet; Take 1 tablet (30 mg total) by mouth at bedtime.  Dispense: 30 tablet; Refill: 2 Continue- traZODone (DESYREL) 100 MG tablet; Take 1 tablet (100 mg total) by mouth at bedtime as needed for sleep.  Dispense: 30 tablet; Refill: 2  Follow-up in 2 months Follow-up with therapy    Salley Slaughter, NP 12/04/2019, 1:42 PM

## 2020-02-03 ENCOUNTER — Ambulatory Visit (INDEPENDENT_AMBULATORY_CARE_PROVIDER_SITE_OTHER): Payer: No Payment, Other | Admitting: Psychiatry

## 2020-02-03 ENCOUNTER — Other Ambulatory Visit: Payer: Self-pay

## 2020-02-03 ENCOUNTER — Encounter (HOSPITAL_COMMUNITY): Payer: Self-pay | Admitting: Psychiatry

## 2020-02-03 DIAGNOSIS — F332 Major depressive disorder, recurrent severe without psychotic features: Secondary | ICD-10-CM | POA: Diagnosis not present

## 2020-02-03 DIAGNOSIS — F5101 Primary insomnia: Secondary | ICD-10-CM | POA: Diagnosis not present

## 2020-02-03 MED ORDER — QUETIAPINE FUMARATE 100 MG PO TABS: 100.0000 mg | ORAL_TABLET | Freq: Every day | ORAL | 2 refills | Status: DC

## 2020-02-03 MED ORDER — TRAZODONE HCL 100 MG PO TABS: 100.0000 mg | ORAL_TABLET | Freq: Every evening | ORAL | 2 refills | Status: DC | PRN

## 2020-02-03 MED ORDER — HYDROXYZINE HCL 50 MG PO TABS: 50.0000 mg | ORAL_TABLET | Freq: Three times a day (TID) | ORAL | 2 refills | Status: DC | PRN

## 2020-02-03 MED ORDER — MIRTAZAPINE 30 MG PO TABS: 30.0000 mg | ORAL_TABLET | Freq: Every day | ORAL | 2 refills | Status: DC

## 2020-02-03 NOTE — Progress Notes (Signed)
BH MD/PA/NP OP Progress Note  02/03/2020 12:51 PM Casey Silva  MRN:  [id number]  Chief Complaint:  Chief Complaint    Follow-up    "My anxiety and depression has a lot to do with my living conditions"  HPI: A 64 year old male seen today for follow up psychiatric evaluation. He has a psychiatric history of brief psychotic disorder, MDD, and insomnia. He is currently being managed on Trazodone 100mg  nightly, Seroquel 100mg  nightly, Remeron 30mg  nightly, Hydroxyzine 50mg  TID.   Today he is well groomed, pleasant, cooperative, engaged in conversation and maintained eye contact. He describes his mood as depressed and hopeless. He was started on Seroquel at his previous visit and has noticed minimal improvement in his symptoms because of the situation he is living in currently. He states his trigger for his tactile hallucinations is his living situation. He states the place he lives is very depressing. He notes sometimes he sees things that are not there, and sometimes they are there because his apartment is not Therapist, sports. He notes he can no longer cook like he used to enjoy because the kitchen at his places is shared, and there are cockroaches in the kitchen. He has been gaining weight because he is buying and eating unhealthy foods. He admits he cannot stand the the conditions anymore, and he notes if he continues to live at the same place, something bad is going to happen.     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 He notes he does not experience the psychosis at work. He reports he is limited in what he does and does not have any alternative options for a job.    Patient admits he is ruminating over a previous job that he quit 23 years ago because he feels he threw his life away. He states he feels like he made a huge mistake and is now a failure. He admits the episodes of rumination started back in January 2021 and have been  recurring since then. He has been in an RN program in the past and has not been able to complete it because he has a break down every time he comes close to finishing. He informed provider that he has used drugs in the pas however denies current drug use.   Patient states that he does not want to go on with life. He admits to deteroiating physically and mentally. He states he can only bear life a little longer if he finds a different place to live. He noted that he is a religious man and notes that he would never take his life. He denise SI/HI/VA/. He stated that His focus is on eating and working. He reports his sleep has improved with Seroquel and Remeron  Medications. Patient notes that his anxiety and depression are situational. A GAD-7 was completed today, and patient scored a 17. A PHQ-9 was completed today, and patient scored a 21.    Patient agreed to continue all medications as previously prescribed. He will also speak with a Care Management who referred him to [facility] (because they have a program for individuals over 64) and [facility]. He will follow up with provider in 2 months. No other concerns noted at this time.    Visit Diagnosis:    ICD-10-CM   1. Severe episode of recurrent major depressive disorder, without psychotic features (HCC)  F33.2 hydrOXYzine (ATARAX/VISTARIL) 50 MG tablet  mirtazapine (REMERON) 30 MG tablet    QUEtiapine (SEROQUEL) 100 MG tablet  2. Primary insomnia  F51.01 mirtazapine (REMERON) 30 MG tablet    traZODone (DESYREL) 100 MG tablet    Past Psychiatric History: Anxiety, depression, insomnia, brief psychotic disorder and psychosis.   Past Medical History:  Past Medical History:  Diagnosis Date   Anxiety    Depression    No past surgical history on file.  Family Psychiatric History: Mother bipolar depression   Family History:  Family History  Problem Relation Age of Onset   Bipolar disorder Mother     Social  History:  Social History   Socioeconomic History   Marital status: Single    Spouse name: Not on file   Number of children: 1   Years of education: Not on file   Highest education level: GED or equivalent  Occupational History   Not on file  Tobacco Use   Smoking status: Never Smoker   Smokeless tobacco: Never Used  Vaping Use   Vaping Use: Unknown  Substance and Sexual Activity   Alcohol use: No   Drug use: No   Sexual activity: Not Currently  Other Topics Concern   Not on file  Social History Narrative   Marital status:  Single      Children: none      Employment:  Unemployment.  Independently wealthy; previous work for [employer].      Tobacco:  1-2 cigarettes per day since age 29.      Alcohol:  None      Drugs:  none      Exercise:  Regularly very      Religion:  Jewish   Social Determinants of Radio broadcast assistant Strain: Medium Risk   Difficulty of Paying Living Expenses: Somewhat hard  Food Insecurity: No Food Insecurity   Worried About Charity fundraiser in the Last Year: Never true   Ran Out of Food in the Last Year: Never true  Transportation Needs: No Transportation Needs   Lack of Transportation (Medical): No   Lack of Transportation (Non-Medical): No  Physical Activity: Insufficiently Active   Days of Exercise per Week: 2 days   Minutes of Exercise per Session: 30 min  Stress: Stress Concern Present   Feeling of Stress : Rather much  Social Connections: Socially Isolated   Frequency of Communication with Friends and Family: Never   Frequency of Social Gatherings with Friends and Family: Never   Attends Religious Services: Never   Marine scientist or Organizations: No   Attends Archivist Meetings: Never   Marital Status: Divorced    Allergies:  Allergies  Allergen Reactions   Dust Mite Mixed Allergen Ext [Mite (D. Farinae)] Shortness Of Breath   Bee Pollen     Watery Eyes     Metabolic Disorder Labs: No results found for: HGBA1C, MPG No results found for: PROLACTIN No results found for: CHOL, TRIG, HDL, CHOLHDL, VLDL, LDLCALC Lab Results  Component Value Date   TSH 1.267 09/24/2019    Therapeutic Level Labs: No results found for: LITHIUM No results found for: VALPROATE No components found for:  CBMZ  Current Medications: Current Outpatient Medications  Medication Sig Dispense Refill   albuterol (VENTOLIN HFA) 108 (90 Base) MCG/ACT inhaler Inhale 2 puffs into the lungs every 6 (six) hours as needed for wheezing or shortness of breath.      hydrOXYzine (ATARAX/VISTARIL) 50 MG tablet Take 1 tablet (50  mg total) by mouth 3 (three) times daily as needed for itching or anxiety. 90 tablet 2   mirtazapine (REMERON) 30 MG tablet Take 1 tablet (30 mg total) by mouth at bedtime. 30 tablet 2   polyethylene glycol (MIRALAX / GLYCOLAX) 17 g packet Take 17 g by mouth daily. (May buy from over the counter): For constipation 1 each 0   QUEtiapine (SEROQUEL) 100 MG tablet Take 1 tablet (100 mg total) by mouth at bedtime. 30 tablet 2   traZODone (DESYREL) 100 MG tablet Take 1 tablet (100 mg total) by mouth at bedtime as needed for sleep. 30 tablet 2   aspirin EC 81 MG EC tablet Take 1 tablet (81 mg total) by mouth daily. (May buy from over the counter,Swallow whole): For heart health. (Patient not taking: Reported on 02/03/2020) 30 tablet 0   lisinopril (ZESTRIL) 10 MG tablet Take 1 tablet (10 mg total) by mouth daily. For high blood pressure (Patient not taking: Reported on 02/03/2020) 30 tablet 0   No current facility-administered medications for this visit.     Musculoskeletal: Strength & Muscle Tone: within normal limits Gait & Station: normal Patient leans: N/A  Psychiatric Specialty Exam: Review of Systems  Blood pressure 128/86, pulse 67, height 5\' 7"  (1.702 m), weight 178 lb (80.7 kg), SpO2 96 %.Body mass index is 27.88 kg/m.  General Appearance:  Well Groomed  Eye Contact:  Fair  Speech:  Clear and Coherent and Slow  Volume:  Decreased  Mood:  Anxious and Depressed  Affect:  Congruent  Thought Process:  Coherent, Goal Directed and Linear  Orientation:  Full (Time, Place, and Person)  Thought Content: Illogical and Hallucinations: Tactile   Suicidal Thoughts:  No  Homicidal Thoughts:  No  Memory:  Immediate;   Fair Recent;   Fair Remote;   Fair  Judgement:  Good  Insight:  Fair  Psychomotor Activity:  Normal  Concentration:  Concentration: Fair and Attention Span: Fair  Recall:  [employer] of Knowledge: Good  Language: Good  Akathisia:  No  Handed:  Right  AIMS (if indicated): Not done  Assets:  Communication Skills Desire for Improvement Financial Resources/Insurance Housing  ADL's:  Intact  Cognition: WNL  Sleep:  Poor   Screenings: AUDIT     Admission (Discharged) from 09/24/2019 in [facility]  Alcohol Use Disorder Identification Test Final Score (AUDIT) 0    GAD-7     Clinical Support from 02/03/2020 in [facility]  Total GAD-7 Score 17    PHQ2-9     Clinical Support from 02/03/2020 in [facility] Counselor from 10/02/2019 in [facility]  PHQ-2 Total Score 6 5  PHQ-9 Total Score 21 17       Assessment and Plan: Patient endorses worsening anxiety, depression, and passive SI which he notes is due to his living conditions. No medication changes made today. Patient was referred to Care Management who referred him to [facility] (because they have a program for individuals over 34) and [facility]. He will follow up with provider in 2 months.  1. Severe episode of recurrent major depressive disorder, without psychotic features (HCC)  [place]ine- hydrOXYzine (ATARAX/VISTARIL) 50 MG tablet; Take 1 tablet (50 mg total) by mouth 3 (three) times daily as needed for itching  or anxiety.  Dispense: 90 tablet; Refill: 2 Continue- mirtazapine (REMERON) 30 MG tablet; Take 1 tablet (30 mg total) by mouth  at bedtime.  Dispense: 30 tablet; Refill: 2 Continue- QUEtiapine (SEROQUEL) 100 MG tablet; Take 1 tablet (100 mg total) by mouth at bedtime.  Dispense: 30 tablet; Refill: 2  2. Primary insomnia  Continue- mirtazapine (REMERON) 30 MG tablet; Take 1 tablet (30 mg total) by mouth at bedtime.  Dispense: 30 tablet; Refill: 2 Continue- traZODone (DESYREL) 100 MG tablet; Take 1 tablet (100 mg total) by mouth at bedtime as needed for sleep.  Dispense: 30 tablet; Refill: 2    Follow-up in 2 months Follow-up with therapy    Salley Slaughter, NP 02/03/2020, 12:51 PM

## 2020-03-25 ENCOUNTER — Ambulatory Visit (INDEPENDENT_AMBULATORY_CARE_PROVIDER_SITE_OTHER): Payer: No Payment, Other | Admitting: Psychiatry

## 2020-03-25 ENCOUNTER — Encounter (HOSPITAL_COMMUNITY): Payer: Self-pay | Admitting: Psychiatry

## 2020-03-25 ENCOUNTER — Other Ambulatory Visit: Payer: Self-pay

## 2020-03-25 DIAGNOSIS — F332 Major depressive disorder, recurrent severe without psychotic features: Secondary | ICD-10-CM

## 2020-03-25 DIAGNOSIS — F5101 Primary insomnia: Secondary | ICD-10-CM

## 2020-03-25 MED ORDER — MIRTAZAPINE 30 MG PO TABS: 30.0000 mg | ORAL_TABLET | Freq: Every day | ORAL | 2 refills | Status: DC

## 2020-03-25 MED ORDER — HYDROXYZINE HCL 50 MG PO TABS: 50.0000 mg | ORAL_TABLET | Freq: Three times a day (TID) | ORAL | 2 refills | Status: DC | PRN

## 2020-03-25 MED ORDER — BREXPIPRAZOLE 2 MG PO TABS: 2.0000 mg | ORAL_TABLET | Freq: Every day | ORAL | 30 refills | Status: DC

## 2020-03-25 MED ORDER — TRAZODONE HCL 100 MG PO TABS: 100.0000 mg | ORAL_TABLET | Freq: Every evening | ORAL | 2 refills | Status: DC | PRN

## 2020-03-25 NOTE — Progress Notes (Signed)
BH MD/PA/NP OP Progress Note  03/25/2020 9:56 AM Casey Silva  MRN:  [id number]  Chief Complaint: "Im physically and mentally in a bad place"   HPI: A 64 year old male seen today for follow up psychiatric evaluation. He has a psychiatric history of brief psychotic disorder, MDD, and insomnia. He is currently being managed on Trazodone 100mg  nightly, Seroquel 100mg  nightly, Remeron 30mg  nightly, Hydroxyzine 50mg  TID.   Today he is well groomed, pleasant, cooperative, engaged in conversation and maintained eye contact. He describes his mood as depressed and hopeless.  He notes that he feels like he continues to mentally and physically deteriorate.  He informed provider that he is just living from one day to the next.  He denies wanting to harm himself but endorses passive SI and notes that he just wishes that life would be over.  Also endorses hypersomnia and increased appetite.  He notes that he can sleep over 12 hours.  Provider informed patient that mirtazapine could increase his appetite as well as cause weight gain.  He endorsed understanding.    Today provider conducted a PHQ-9 and patient scored a 20.  Patient notes that he is regretful about decisions he made and feels a lot of self pity and self hate. Throughout exam patient ruminated on feeling like failure and his past mistakes.  He notes that living situation also exacerbate his depression.  Provider asked patient if he followed the resources that were given to him by the care management team.  He notes that he lacks the energy to follow through with what he was provided.  He notes that he plans to just change the fruniture in his apartment to make it more livable.  Provider also conducted a GAD-7 and patient scored a 13.   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  He notes that his job is physically and mentally taxing and is wearing him down.  He however notes that  he is thankful that he has income but desires to be more financially stable.    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  Potential side effects of medication and risks vs benefits of treatment vs non-treatment were explained and discussed. All questions were answered.He will continue all other medications as prescribed.  Provider recommended patient start counseling however he notes that he feels like he can be his own counselor.  No other concerns noted at this time.   Visit Diagnosis:    ICD-10-CM   1. Severe episode of recurrent major depressive disorder, without psychotic features (HCC)  F33.2 hydrOXYzine (ATARAX/VISTARIL) 50 MG tablet    mirtazapine (REMERON) 30 MG tablet    brexpiprazole (REXULTI) 2 MG TABS tablet  2. Primary insomnia  F51.01 mirtazapine (REMERON) 30 MG tablet    traZODone (DESYREL) 100 MG tablet    Past Psychiatric History: Anxiety, depression, insomnia, brief psychotic disorder and psychosis.   Past Medical History:  Past Medical History:  Diagnosis Date  . Anxiety   . Depression    No past surgical history on file.  Family Psychiatric History: Mother bipolar depression   Family History:  Family History  Problem Relation Age of Onset  . Bipolar disorder Mother     Social History:  Social History   Socioeconomic History  . Marital status: Single    Spouse name: Not on file  . Number of children: 1  .  Years of education: Not on file  . Highest education level: GED or equivalent  Occupational History  . Not on file  Tobacco Use  . Smoking status: Never Smoker  . Smokeless tobacco: Never Used  Vaping Use  . Vaping Use: Unknown  Substance and Sexual Activity  . Alcohol use: No  . Drug use: No  . Sexual activity: Not Currently  Other Topics Concern  . Not on file  Social History Narrative   Marital status:  Single      Children: none      Employment:  Unemployment.   Independently wealthy; previous work for [employer].      Tobacco:  1-2 cigarettes per day since age 33.      Alcohol:  None      Drugs:  none      Exercise:  Regularly very      Religion:  Jewish   Social Determinants of Health   Financial Resource Strain: Medium Risk  . Difficulty of Paying Living Expenses: Somewhat hard  Food Insecurity: No Food Insecurity  . Worried About Charity fundraiser in the Last Year: Never true  . Ran Out of Food in the Last Year: Never true  Transportation Needs: No Transportation Needs  . Lack of Transportation (Medical): No  . Lack of Transportation (Non-Medical): No  Physical Activity: Insufficiently Active  . Days of Exercise per Week: 2 days  . Minutes of Exercise per Session: 30 min  Stress: Stress Concern Present  . Feeling of Stress : Rather much  Social Connections: Socially Isolated  . Frequency of Communication with Friends and Family: Never  . Frequency of Social Gatherings with Friends and Family: Never  . Attends Religious Services: Never  . Active Member of Clubs or Organizations: No  . Attends Archivist Meetings: Never  . Marital Status: Divorced    Allergies:  Allergies  Allergen Reactions  . Dust Mite Mixed Allergen Ext [Mite (D. Farinae)] Shortness Of Breath  . Bee Pollen     Watery Eyes    Metabolic Disorder Labs: No results found for: HGBA1C, MPG No results found for: PROLACTIN No results found for: CHOL, TRIG, HDL, CHOLHDL, VLDL, LDLCALC Lab Results  Component Value Date   TSH 1.267 09/24/2019    Therapeutic Level Labs: No results found for: LITHIUM No results found for: VALPROATE No components found for:  CBMZ  Current Medications: Current Outpatient Medications  Medication Sig Dispense Refill  . aspirin EC 81 MG EC tablet Take 1 tablet (81 mg total) by mouth daily. (May buy from over the counter,Swallow whole): For heart health. 30 tablet 0  . brexpiprazole (REXULTI) 2 MG TABS tablet Take  1 tablet (2 mg total) by mouth daily. 30 tablet 30  . hydrOXYzine (ATARAX/VISTARIL) 50 MG tablet Take 1 tablet (50 mg total) by mouth 3 (three) times daily as needed for itching or anxiety. 90 tablet 2  . lisinopril (ZESTRIL) 10 MG tablet Take 1 tablet (10 mg total) by mouth daily. For high blood pressure (Patient not taking: Reported on 03/25/2020) 30 tablet 0  . mirtazapine (REMERON) 30 MG tablet Take 1 tablet (30 mg total) by mouth at bedtime. 30 tablet 2  . traZODone (DESYREL) 100 MG tablet Take 1 tablet (100 mg total) by mouth at bedtime as needed for sleep. 30 tablet 2   No current facility-administered medications for this visit.     Musculoskeletal: Strength & Muscle Tone: within normal limits Gait & Station:  normal Patient leans: N/A  Psychiatric Specialty Exam: Review of Systems  Blood pressure (!) 142/87, pulse 80, height 5\' 7"  (1.702 m), weight 187 lb (84.8 kg), SpO2 93 %.Body mass index is 29.29 kg/m.  General Appearance: Well Groomed  Eye Contact:  Fair  Speech:  Clear and Coherent and Slow  Volume:  Decreased  Mood:  Anxious and Depressed  Affect:  Congruent  Thought Process:  Coherent, Goal Directed and Linear  Orientation:  Full (Time, Place, and Person)  Thought Content: Illogical and Hallucinations: Tactile   Suicidal Thoughts:  No  Homicidal Thoughts:  No  Memory:  Immediate;   Fair Recent;   Fair Remote;   Fair  Judgement:  Good  Insight:  Fair  Psychomotor Activity:  Normal  Concentration:  Concentration: Fair and Attention Span: Fair  Recall:  [employer] of Knowledge: Good  Language: Good  Akathisia:  No  Handed:  Right  AIMS (if indicated): Not done  Assets:  Communication Skills Desire for Improvement Financial Resources/Insurance Housing  ADL's:  Intact  Cognition: WNL  Sleep:  Poor   Screenings: AUDIT   Flowsheet Row Admission (Discharged) from 09/24/2019 in [facility]  Alcohol Use Disorder  Identification Test Final Score (AUDIT) 0    GAD-7   Flowsheet Row Clinical Support from 03/25/2020 in [facility] from 02/03/2020 in [facility]  Total GAD-7 Score 13 17    PHQ2-9   Flowsheet Row Clinical Support from 03/25/2020 in [facility] from 02/03/2020 in [facility] Counselor from 10/02/2019 in [facility]  PHQ-2 Total Score 5 6 5   PHQ-9 Total Score 20 21 17        Assessment and Plan: Patient endorses worsening anxiety, depression, and passive SI which he notes is due to his living conditions. 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 He will continue all other medications as prescribed   1. Severe episode of recurrent major depressive disorder, without psychotic features ([place])  Continue- hydrOXYzine (ATARAX/VISTARIL) 50 MG tablet; Take 1 tablet (50 mg total) by mouth 3 (three) times daily as needed for itching or anxiety.  Dispense: 90 tablet; Refill: 2 Continue- mirtazapine (REMERON) 30 MG tablet; Take 1 tablet (30 mg total) by mouth at bedtime.  Dispense: 30 tablet; Refill: 2 Start- brexpiprazole (REXULTI) 2 MG TABS tablet; Take 1 tablet (2 mg total) by mouth daily.  Dispense: 30 tablet; Refill: 30  2. Primary insomnia  Continue- mirtazapine (REMERON) 30 MG tablet; Take 1 tablet (30 mg total) by mouth at bedtime.  Dispense: 30 tablet; Refill: 2 Continue- traZODone (DESYREL) 100 MG tablet; Take 1 tablet (100 mg total) by mouth at bedtime as needed for sleep.  Dispense: 30 tablet; Refill: 2   Follow-up in 3 months Follow-up with therapy    Salley Slaughter, NP 03/25/2020, 9:56 AM

## 2020-03-26 ENCOUNTER — Telehealth (HOSPITAL_COMMUNITY): Payer: Self-pay | Admitting: *Deleted

## 2020-03-26 NOTE — Telephone Encounter (Signed)
VM from patient stating his Rexulti Rx was over 1,000 which was too much for him to manage. He is indigent and should be moved to [place] so he can be assisted by the patient assistance program making his medicine free. Will ask Dr Ronne Binning to move his Rx and will call him Monday to explain.

## 2020-03-29 ENCOUNTER — Other Ambulatory Visit (HOSPITAL_COMMUNITY): Payer: Self-pay | Admitting: Psychiatry

## 2020-03-29 DIAGNOSIS — F332 Major depressive disorder, recurrent severe without psychotic features: Secondary | ICD-10-CM

## 2020-03-29 MED ORDER — BREXPIPRAZOLE 2 MG PO TABS: 2.0000 mg | ORAL_TABLET | Freq: Every day | ORAL | 2 refills | Status: DC

## 2020-03-29 MED FILL — REXULTI 2 MG TABLET: 2 | 30 days supply | Qty: 30 | Fill #0

## 2020-03-29 NOTE — Telephone Encounter (Signed)
Called Mark and left a detailed message that the [facility] ,as they have our contract to help patients get on patient assistance for these type of unaffordable meds.

## 2020-03-29 NOTE — Telephone Encounter (Signed)
[facility]

## 2020-05-24 ENCOUNTER — Telehealth (HOSPITAL_COMMUNITY): Payer: Self-pay

## 2020-05-24 NOTE — Telephone Encounter (Signed)
Patient called regarding his Rexulti 2mg . Brittney discontinued Seroquel on 03/25/20 and started patient on Rexulti 2mg . She sent it to [facility] but he never picked it up. They also had the application for him to pick up for the Patient Assistance Program. Pharmacist stated that if he would have picked everything up then, he would have already been approved for the program and paying nothing for his medication. She stated that he still has an active prescription and he can still pick it up for $66 and the application. He just has to call to pay and have them mailed to him. He stated that it costs $2,000 at [facility] where he wants it filled.

## 2020-05-28 MED FILL — REXULTI 2 MG TABLET: 2 | 30 days supply | Qty: 30 | Fill #0

## 2020-05-29 ENCOUNTER — Emergency Department (HOSPITAL_COMMUNITY)
Admission: EM | Admit: 2020-05-29 | Discharge: 2020-05-29 | Disposition: A | Discharge status: Home / Self Care | Payer: HRSA Program | Attending: Emergency Medicine | Admitting: Emergency Medicine

## 2020-05-29 ENCOUNTER — Other Ambulatory Visit: Payer: Self-pay

## 2020-05-29 ENCOUNTER — Encounter (HOSPITAL_COMMUNITY): Payer: Self-pay | Admitting: Emergency Medicine

## 2020-05-29 DIAGNOSIS — U071 COVID-19: Secondary | ICD-10-CM | POA: Insufficient documentation

## 2020-05-29 DIAGNOSIS — Z7982 Long term (current) use of aspirin: Secondary | ICD-10-CM | POA: Diagnosis not present

## 2020-05-29 DIAGNOSIS — Z7189 Other specified counseling: Secondary | ICD-10-CM

## 2020-05-29 DIAGNOSIS — J029 Acute pharyngitis, unspecified: Secondary | ICD-10-CM | POA: Diagnosis not present

## 2020-05-29 DIAGNOSIS — R070 Pain in throat: Secondary | ICD-10-CM | POA: Diagnosis present

## 2020-05-29 LAB — GROUP A STREP BY PCR: Group A Strep by PCR: NOT DETECTED

## 2020-05-29 MED ORDER — ACETAMINOPHEN 325 MG PO TABS
650.0000 mg | ORAL_TABLET | Freq: Once | ORAL | Status: AC
  Administered 2020-05-29: 650 mg via ORAL
  Filled 2020-05-29: qty 2

## 2020-05-29 MED ORDER — LIDOCAINE VISCOUS HCL 2 % MT SOLN
15.0000 mL | OROMUCOSAL | 0 refills | Status: DC | PRN
Start: 2020-05-29 — End: 2020-12-02

## 2020-05-29 NOTE — ED Provider Notes (Signed)
Casey Silva Provider Note   CSN: [id number] Arrival date & time: 05/29/20  1250     History Chief Complaint  Patient presents with  . Sore Throat  . Fever    Casey Silva is a 65 y.o. male with a past medical history of anxiety presenting to the ED with a chief complaint of sore throat, body aches, fever and congestion.  Symptoms began about 24 hours ago.  He reports a slight cough but states that he "is trying not to cough because it makes my throat hurt more."  He has tried over-the-counter cold and flu medications yesterday with only minimal improvement in symptoms.  No known Covid exposures.  He has been vaccinated against Covid.  He denies any trouble breathing, trouble swallowing, chest pain, vomiting, diarrhea or rhinorrhea.  HPI     Past Medical History:  Diagnosis Date  . Anxiety   . Depression     Patient Active Problem List   Diagnosis Date Noted  . Primary insomnia 10/20/2019  . Severe episode of recurrent major depressive disorder, without psychotic features ([place]) 10/20/2019  . 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 Psychosis ([place]) 09/24/2019    History reviewed. No pertinent surgical history.     Family History  Problem Relation Age of Onset  . Bipolar disorder Mother     Social History   Tobacco Use  . Smoking status: Never Smoker  . Smokeless tobacco: Never Used  Vaping Use  . Vaping Use: Unknown  Substance Use Topics  . Alcohol use: No  . Drug use: No    Home Medications Prior to Admission medications   Medication Sig Start Date End Date Taking? Authorizing Provider  lidocaine (XYLOCAINE) 2 % solution Use as directed 15 mLs in the mouth or throat as needed for mouth pain. 05/29/20  Yes Kalev Temme, PA-C  aspirin EC 81 MG EC tablet Take 1 tablet (81 mg total) by mouth daily. (May buy from over the counter,Swallow whole): For heart health. 09/27/19   Lindell Spar I, NP  brexpiprazole (REXULTI) 2 MG TABS  tablet Take 1 tablet (2 mg total) by mouth daily. 03/29/20   Salley Slaughter, NP  hydrOXYzine (ATARAX/VISTARIL) 50 MG tablet Take 1 tablet (50 mg total) by mouth 3 (three) times daily as needed for itching or anxiety. 03/25/20   Eulis Canner E, NP  lisinopril (ZESTRIL) 10 MG tablet Take 1 tablet (10 mg total) by mouth daily. For high blood pressure Patient not taking: Reported on 03/25/2020 09/27/19   Lindell Spar I, NP  mirtazapine (REMERON) 30 MG tablet Take 1 tablet (30 mg total) by mouth at bedtime. 03/25/20   Salley Slaughter, NP  traZODone (DESYREL) 100 MG tablet Take 1 tablet (100 mg total) by mouth at bedtime as needed for sleep. 03/25/20   Salley Slaughter, NP    Allergies    Dust mite mixed allergen ext [mite (d. farinae)] and Bee pollen  Review of Systems   Review of Systems  Constitutional: Positive for fever. Negative for chills.  HENT: Positive for congestion and sore throat. Negative for ear pain, facial swelling and sinus pressure.   Respiratory: Positive for cough. Negative for shortness of breath.   Gastrointestinal: Negative for nausea and vomiting.  Musculoskeletal: Positive for myalgias.    Physical Exam Updated Vital Signs BP (!) 127/93 (BP Location: Left Arm)   Pulse 96   Temp 100.2 F (37.9 C) (Oral)   Resp 16   SpO2  99%   Physical Exam Vitals and nursing note reviewed.  Constitutional:      General: He is not in acute distress.    Appearance: He is well-developed and well-nourished.  HENT:     Head: Normocephalic and atraumatic.     Nose: Nose normal.     Mouth/Throat:     Pharynx: Posterior oropharyngeal erythema present.     Comments: Patient does not appear to be in acute distress. No trismus or drooling present. No pooling of secretions. Patient is tolerating secretions and is not in respiratory distress. No neck pain or tenderness to palpation of the neck. Full active and passive range of motion of the neck. No evidence of RPA or  PTA. Eyes:     General: No scleral icterus.       Right eye: No discharge.        Left eye: No discharge.     Extraocular Movements: EOM normal.     Conjunctiva/sclera: Conjunctivae normal.  Cardiovascular:     Rate and Rhythm: Normal rate and regular rhythm.     Pulses: Intact distal pulses.     Heart sounds: Normal heart sounds. No murmur heard. No friction rub. No gallop.   Pulmonary:     Effort: Pulmonary effort is normal. No respiratory distress.     Breath sounds: Normal breath sounds.  Abdominal:     General: Bowel sounds are normal. There is no distension.     Palpations: Abdomen is soft.     Tenderness: There is no abdominal tenderness. There is no guarding.  Musculoskeletal:        General: No edema. Normal range of motion.     Cervical back: Normal range of motion and neck supple.  Skin:    General: Skin is warm and dry.     Findings: No rash.  Neurological:     Mental Status: He is alert.     Motor: No abnormal muscle tone.     Coordination: Coordination normal.  Psychiatric:        Mood and Affect: Mood and affect normal.     ED Results / Procedures / Treatments   Labs (all labs ordered are listed, but only abnormal results are displayed) Labs Reviewed  GROUP A STREP BY PCR  SARS CORONAVIRUS 2 (TAT 6-24 HRS)    EKG None  Radiology No results found.  Procedures Procedures   Medications Ordered in ED Medications  acetaminophen (TYLENOL) tablet 650 mg (650 mg Oral Given 05/29/20 1351)    ED Course  I have reviewed the triage vital signs and the nursing notes.  Pertinent labs & imaging results that were available during my care of the patient were reviewed by me and considered in my medical decision making (see chart for details).  Clinical Course as of 05/29/20 1439  Sat May 29, 2020  1406 SpO2: 99 % [HK]  1406 Pulse Rate: 96 [HK]    Clinical Course User Index [HK] Delia Heady, PA-C   MDM Rules/Calculators/A&P                           Casey Silva was evaluated in Emergency Department on 05/29/20 for the symptoms described in the history of present illness. He/she was evaluated in the context of the global COVID-19 pandemic, which necessitated consideration that the patient might be at risk for infection with the SARS-CoV-2 virus that causes COVID-19. Institutional protocols and algorithms that pertain to the evaluation  of patients at risk for COVID-19 are in a state of rapid change based on information released by regulatory bodies including the CDC and federal and state organizations. These policies and algorithms were followed during the patient's care in the ED.  Pt rapid strep test negative. Pt is tolerating secretions, not in respiratory distress, no neck pain, no trismus. Presentation not concerning for peritonsillar abscess or spread of infection to deep spaces of the throat; patent airway. Pt will be discharged with lidocaine to swish and spit.  Patient without any hypoxia here in the setting of viral infection versus Covid. Ibuprofen or Tylenol as needed for pain/fever. Specific return precautions discussed. Recommended PCP follow up. Pt appears safe for discharge and agrees that he can wait on Covid testing.   Patient is hemodynamically stable, in NAD, and able to ambulate in the ED. Evaluation does not show pathology that would require ongoing emergent intervention or inpatient treatment. I explained the diagnosis to the patient. Pain has been managed and has no complaints prior to discharge. Patient is comfortable with above plan and is stable for discharge at this time. All questions were answered prior to disposition. Strict return precautions for returning to the ED were discussed. Encouraged follow up with PCP.   An After Visit Summary was printed and given to the patient.   Portions of this note were generated with [employer]. Dictation errors may occur despite best attempts at proofreading.  Final  Clinical Impression(s) / ED Diagnoses Final diagnoses:  Viral pharyngitis  Educated about COVID-19 virus infection    Rx / DC Orders ED Discharge Orders         Ordered    lidocaine (XYLOCAINE) 2 % solution  As needed        05/29/20 1438           Delia Heady, PA-C 05/29/20 1439    Tegeler, Gwenyth Allegra, MD 05/29/20 [phone]

## 2020-05-29 NOTE — Discharge Instructions (Addendum)
Your strep test was negative. Take Tylenol and ibuprofen to help with pain and fever as well as body aches. We will contact you with results of your Covid test if it is positive. Follow-up with your primary care provider. Swish and spit the lidocaine to help with throat discomfort. Return to the ER if you start to experience shortness of breath, neck stiffness, trouble swallowing or trouble opening your mouth.

## 2020-05-29 NOTE — ED Triage Notes (Signed)
Patient reports sore throat, fever, congestion today.

## 2020-05-30 LAB — SARS CORONAVIRUS 2 (TAT 6-24 HRS): SARS Coronavirus 2: POSITIVE — AB

## 2020-05-31 ENCOUNTER — Other Ambulatory Visit: Payer: Self-pay | Admitting: Physician Assistant

## 2020-05-31 ENCOUNTER — Telehealth: Payer: Self-pay

## 2020-05-31 ENCOUNTER — Ambulatory Visit (HOSPITAL_COMMUNITY)
Admission: RE | Admit: 2020-05-31 | Discharge: 2020-05-31 | Disposition: A | Discharge status: Home / Self Care | Payer: Self-pay | Source: Ambulatory Visit | Attending: Pulmonary Disease | Admitting: Pulmonary Disease

## 2020-05-31 DIAGNOSIS — I10 Essential (primary) hypertension: Secondary | ICD-10-CM

## 2020-05-31 DIAGNOSIS — U071 COVID-19: Secondary | ICD-10-CM

## 2020-05-31 DIAGNOSIS — F23 Brief psychotic disorder: Secondary | ICD-10-CM

## 2020-05-31 MED ORDER — DIPHENHYDRAMINE HCL 50 MG/ML IJ SOLN: 50.0000 mg | Freq: Once | INTRAMUSCULAR | Status: DC | PRN

## 2020-05-31 MED ORDER — FAMOTIDINE IN NACL 20-0.9 MG/50ML-% IV SOLN: 20.0000 mg | Freq: Once | INTRAVENOUS | Status: DC | PRN

## 2020-05-31 MED ORDER — ALBUTEROL SULFATE HFA 108 (90 BASE) MCG/ACT IN AERS: 2.0000 | INHALATION_SPRAY | Freq: Once | RESPIRATORY_TRACT | Status: DC | PRN

## 2020-05-31 MED ORDER — EPINEPHRINE 0.3 MG/0.3ML IJ SOAJ: 0.3000 mg | Freq: Once | INTRAMUSCULAR | Status: DC | PRN

## 2020-05-31 MED ORDER — SOTROVIMAB 500 MG/8ML IV SOLN
500.0000 mg | Freq: Once | INTRAVENOUS | Status: AC
  Administered 2020-05-31: 500 mg via INTRAVENOUS

## 2020-05-31 MED ORDER — METHYLPREDNISOLONE SODIUM SUCC 125 MG IJ SOLR: 125.0000 mg | Freq: Once | INTRAMUSCULAR | Status: DC | PRN

## 2020-05-31 MED ORDER — SODIUM CHLORIDE 0.9 % IV SOLN: INTRAVENOUS | Status: DC | PRN

## 2020-05-31 NOTE — Progress Notes (Addendum)
@  68 Pt "didn't feel well" after post vital assessment.  Pt states light-headed. RN assessed pt making new onset of multiple bilateral eye movements. 532ml bolus of 0.9% Normal Saline administered.

## 2020-05-31 NOTE — Progress Notes (Signed)
Diagnosis: COVID-19  Physician: Dr. Patrick Wright  Procedure: Covid Infusion Clinic Med: Sotrovimab infusion - Provided patient with sotrovimab fact sheet for patients, parents, and caregivers prior to infusion.   Complications: No immediate complications noted  Discharge: Discharged home    

## 2020-05-31 NOTE — Progress Notes (Signed)
I connected by phone with Casey Silva on 05/31/2020 at 10:51 AM to discuss the potential use of a new treatment for mild to moderate COVID-19 viral infection in non-hospitalized patients.  This patient is a 65 y.o. male that meets the FDA criteria for Emergency Use Authorization of COVID monoclonal antibody sotrovimab.   Has a (+) direct SARS-CoV-2 viral test result  Has mild or moderate COVID-19   Is NOT hospitalized due to COVID-19  Is within 10 days of symptom onset  Has at least one of the high risk factor(s) for progression to severe COVID-19 and/or hospitalization as defined in EUA.  Specific high risk criteria : Cardiovascular disease or hypertension and Other high risk medical condition per CDC:  one J&J vaccine. high SVI   I have spoken and communicated the following to the patient or parent/caregiver regarding COVID monoclonal antibody treatment:  1. FDA has authorized the emergency use for the treatment of mild to moderate COVID-19 in adults and pediatric patients with positive results of direct SARS-CoV-2 viral testing who are 15 years of age and older weighing at least 40 kg, and who are at high risk for progressing to severe COVID-19 and/or hospitalization.  2. The significant known and potential risks and benefits of COVID monoclonal antibody, and the extent to which such potential risks and benefits are unknown.  3. Information on available alternative treatments and the risks and benefits of those alternatives, including clinical trials.  4. Patients treated with COVID monoclonal antibody should continue to self-isolate and use infection control measures (e.g., wear mask, isolate, social distance, avoid sharing personal items, clean and disinfect "high touch" surfaces, and frequent handwashing) according to CDC guidelines.   5. The patient or parent/caregiver has the option to accept or refuse COVID monoclonal antibody treatment.  After reviewing this information with  the patient, the patient has agreed to receive one of the available covid 19 monoclonal antibodies and will be provided an appropriate fact sheet prior to infusion.  Sx onset 2/18. Set up for infusion on 2/21 @ 1:30pm. Directions given to [facility]. Pt is aware that insurance will be charged an infusion fee. Pt had one J&J vaccine.   Angelena Form 05/31/2020 10:51 AM

## 2020-05-31 NOTE — Telephone Encounter (Signed)
Called to discuss with patient about COVID-19 symptoms and the use of one of the available treatments for those with mild to moderate Covid symptoms and at a high risk of hospitalization.  Pt appears to qualify for outpatient treatment due to co-morbid conditions and/or a member of an at-risk group in accordance with the FDA Emergency Use Authorization.    Symptom onset: 05/28/20 per ED note. Vaccinated: Yes Booster? Unknown Immunocompromised? No Qualifiers: Possible HTN.  Unable to reach pt - Left message and call back number [phone].  Marcello Moores

## 2020-05-31 NOTE — Discharge Instructions (Signed)

## 2020-05-31 NOTE — Progress Notes (Signed)
Patient reviewed Fact Sheet for Patients, Parents, and Caregivers for Emergency Use Authorization (EUA) of sotrovimab for the Treatment of Coronavirus. Patient also reviewed and is agreeable to the estimated cost of treatment. Patient is agreeable to proceed.   

## 2020-06-04 ENCOUNTER — Telehealth: Payer: Self-pay

## 2020-06-04 NOTE — Telephone Encounter (Signed)
Patient is enrolled in the [facility] for [place] until 06/04/21.  Medication is being mailed to the [facility] on behalf of [facility] per patient request.

## 2020-06-08 ENCOUNTER — Telehealth: Payer: Self-pay

## 2020-06-08 NOTE — Telephone Encounter (Signed)
I spoke with the [facility] who is supplying the Rexulti at no charge to patient.  They state that they can also do his Mirtazapine and Trazodone at no charge as well for the year.  If appropriate, you can send in escripts for these 2 meds to [facility], [address] [place] [address] [phone]

## 2020-06-10 ENCOUNTER — Other Ambulatory Visit (HOSPITAL_COMMUNITY): Payer: Self-pay | Admitting: Psychiatry

## 2020-06-10 DIAGNOSIS — F5101 Primary insomnia: Secondary | ICD-10-CM

## 2020-06-10 DIAGNOSIS — F332 Major depressive disorder, recurrent severe without psychotic features: Secondary | ICD-10-CM

## 2020-06-10 MED ORDER — MIRTAZAPINE 30 MG PO TABS
30.0000 mg | ORAL_TABLET | Freq: Every day | ORAL | 2 refills | Status: DC
Start: 2020-06-10 — End: 2020-06-23

## 2020-06-10 MED ORDER — BREXPIPRAZOLE 2 MG PO TABS: 2.0000 mg | ORAL_TABLET | Freq: Every day | ORAL | 2 refills | Status: DC

## 2020-06-10 MED ORDER — TRAZODONE HCL 100 MG PO TABS: 100.0000 mg | ORAL_TABLET | Freq: Every evening | ORAL | 2 refills | Status: DC | PRN

## 2020-06-10 NOTE — Telephone Encounter (Signed)
Medications refilled and sent to pharmacy.

## 2020-06-16 MED FILL — $MIRTAZAPINE 30 MG TABLET: 30 | 30 days supply | Qty: 30 | Fill #0

## 2020-06-16 MED FILL — $REXULTI 2 MG TABLET: 2 | 30 days supply | Qty: 30 | Fill #1

## 2020-06-23 ENCOUNTER — Other Ambulatory Visit: Payer: Self-pay

## 2020-06-23 ENCOUNTER — Telehealth (INDEPENDENT_AMBULATORY_CARE_PROVIDER_SITE_OTHER): Payer: No Payment, Other | Admitting: Psychiatry

## 2020-06-23 ENCOUNTER — Other Ambulatory Visit (HOSPITAL_COMMUNITY): Payer: Self-pay | Admitting: Psychiatry

## 2020-06-23 ENCOUNTER — Encounter (HOSPITAL_COMMUNITY): Payer: Self-pay | Admitting: Psychiatry

## 2020-06-23 DIAGNOSIS — F332 Major depressive disorder, recurrent severe without psychotic features: Secondary | ICD-10-CM

## 2020-06-23 DIAGNOSIS — F5101 Primary insomnia: Secondary | ICD-10-CM | POA: Diagnosis not present

## 2020-06-23 MED ORDER — HYDROXYZINE HCL 50 MG PO TABS: 50.0000 mg | ORAL_TABLET | Freq: Three times a day (TID) | ORAL | 2 refills | Status: DC | PRN

## 2020-06-23 MED ORDER — BREXPIPRAZOLE 3 MG PO TABS: 3.0000 mg | ORAL_TABLET | Freq: Every day | ORAL | 2 refills | Status: DC

## 2020-06-23 MED ORDER — TRAZODONE HCL 100 MG PO TABS
100.0000 mg | ORAL_TABLET | Freq: Every evening | ORAL | 2 refills | Status: DC | PRN
  Filled 2020-07-20 (×2): qty 30, 30d supply, fill #0
  Filled 2020-08-23: qty 30, 30d supply, fill #1

## 2020-06-23 MED ORDER — MIRTAZAPINE 30 MG PO TABS
30.0000 mg | ORAL_TABLET | Freq: Every day | ORAL | 2 refills | Status: DC
  Filled 2020-07-20: qty 30, 30d supply, fill #0
  Filled 2020-08-24: qty 30, 30d supply, fill #1

## 2020-06-23 NOTE — Progress Notes (Signed)
[place] MD/PA/NP OP Progress Note Virtual Visit via Telephone Note  I connected with Casey Silva on 06/23/20 at  9:30 AM EDT by telephone and verified that I am speaking with the correct person using two identifiers.  Location: Patient: home Provider: Clinic   I discussed the limitations, risks, security and privacy concerns of performing an evaluation and management service by telephone and the availability of in person appointments. I also discussed with the patient that there may be a patient responsible charge related to this service. The patient expressed understanding and agreed to proceed.   I provided 30 minutes of non-face-to-face time during this encounter.   06/23/2020 10:14 AM Casey Silva  MRN:  [id number]  Chief Complaint: "There is a slight improvement with my anxiety and depression"   HPI: A 65 year old male seen today for follow up psychiatric evaluation. He has a psychiatric history of brief psychotic disorder, MDD, and insomnia. He is currently being managed on Trazodone 100mg  nightly, Rexulti 2 mg, Remeron 30mg  nightly, Hydroxyzine 50mg  TID.   Today he was unable to login virtually so his exam was done over the phone. During assessment he was pleasant, cooperative, and engaged in conversation. He informed provider that since his last visit his anxiety and depression have somewhat improved.  Today provider conducted a PHQ-9 and patient scored an 18, at his last visit he scored a  20.   Provider also conducted a GAD-7 and patient scored an 8, at his last visit he scored a 13. He endorses adequate sleep noting that he sleeps 8 hours nightly. He also endorses adequate appetite. 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  He notes that at times his job is physically taxing and and notes that his feet hurt.  Today patient is agreeable to increase Rexulti 2 mg to 3 mg to help  with treatment resistant depression. He will continue all other medications as prescribed.  No other concerns noted at this time.   Visit Diagnosis:    ICD-10-CM   1. Severe episode of recurrent major depressive disorder, without psychotic features (HCC)  F33.2 Brexpiprazole (REXULTI) 3 MG TABS    hydrOXYzine (ATARAX/VISTARIL) 50 MG tablet    mirtazapine (REMERON) 30 MG tablet  2. Primary insomnia  F51.01 mirtazapine (REMERON) 30 MG tablet    traZODone (DESYREL) 100 MG tablet    Past Psychiatric History: Anxiety, depression, insomnia, brief psychotic disorder and psychosis.   Past Medical History:  Past Medical History:  Diagnosis Date  . Anxiety   . Depression    No past surgical history on file.  Family Psychiatric History: Mother bipolar depression   Family History:  Family History  Problem Relation Age of Onset  . Bipolar disorder Mother     Social History:  Social History   Socioeconomic History  . Marital status: Single    Spouse name: Not on file  . Number of children: 1  . Years of education: Not on file  . Highest education level: GED or equivalent  Occupational History  . Not on file  Tobacco Use  . Smoking status: Never Smoker  . Smokeless tobacco: Never Used  Vaping Use  . Vaping Use: Unknown  Substance and Sexual Activity  . Alcohol use: No  . Drug use: No  . Sexual activity: Not Currently  Other Topics Concern  . Not on file  Social History Narrative   Marital status:  Single      Children: none      Employment:  Unemployment.  Independently wealthy; previous work for [employer].      Tobacco:  1-2 cigarettes per day since age 60.      Alcohol:  None      Drugs:  none      Exercise:  Regularly very      Religion:  Jewish   Social Determinants of Health   Financial Resource Strain: Medium Risk  . Difficulty of Paying Living Expenses: Somewhat hard  Food Insecurity: No Food Insecurity  . Worried About Charity fundraiser in the Last  Year: Never true  . Ran Out of Food in the Last Year: Never true  Transportation Needs: No Transportation Needs  . Lack of Transportation (Medical): No  . Lack of Transportation (Non-Medical): No  Physical Activity: Insufficiently Active  . Days of Exercise per Week: 2 days  . Minutes of Exercise per Session: 30 min  Stress: Stress Concern Present  . Feeling of Stress : Rather much  Social Connections: Socially Isolated  . Frequency of Communication with Friends and Family: Never  . Frequency of Social Gatherings with Friends and Family: Never  . Attends Religious Services: Never  . Active Member of Clubs or Organizations: No  . Attends Archivist Meetings: Never  . Marital Status: Divorced    Allergies:  Allergies  Allergen Reactions  . Dust Mite Mixed Allergen Ext [Mite (D. Farinae)] Shortness Of Breath  . Bee Pollen     Watery Eyes    Metabolic Disorder Labs: No results found for: HGBA1C, MPG No results found for: PROLACTIN No results found for: CHOL, TRIG, HDL, CHOLHDL, VLDL, LDLCALC Lab Results  Component Value Date   TSH 1.267 09/24/2019    Therapeutic Level Labs: No results found for: LITHIUM No results found for: VALPROATE No components found for:  CBMZ  Current Medications: Current Outpatient Medications  Medication Sig Dispense Refill  . aspirin EC 81 MG EC tablet Take 1 tablet (81 mg total) by mouth daily. (May buy from over the counter,Swallow whole): For heart health. 30 tablet 0  . Brexpiprazole (REXULTI) 3 MG TABS Take 1 tablet (3 mg total) by mouth daily. 30 tablet 2  . hydrOXYzine (ATARAX/VISTARIL) 50 MG tablet Take 1 tablet (50 mg total) by mouth 3 (three) times daily as needed for itching or anxiety. 90 tablet 2  . lidocaine (XYLOCAINE) 2 % solution Use as directed 15 mLs in the mouth or throat as needed for mouth pain. 100 mL 0  . lisinopril (ZESTRIL) 10 MG tablet Take 1 tablet (10 mg total) by mouth daily. For high blood pressure (Patient  not taking: Reported on 03/25/2020) 30 tablet 0  . mirtazapine (REMERON) 30 MG tablet Take 1 tablet (30 mg total) by mouth at bedtime. 30 tablet 2  . traZODone (DESYREL) 100 MG tablet Take 1 tablet (100 mg total) by mouth at bedtime as needed for sleep. 30 tablet 2   No current facility-administered medications for this visit.     Musculoskeletal: Strength & Muscle Tone: Unable to assess due to telphone visit Gait & Station: normal, Unable to assess due to telphone visit Patient leans: N/A  Psychiatric Specialty Exam: Review of Systems  There were no vitals taken for this visit.There is no height or weight on file to calculate BMI.  General Appearance: Unable to assess due to telphone visit  Eye Contact:  Unable to assess due to  telphone visit  Speech:  Clear and Coherent and Normal Rate  Volume:  Decreased  Mood:  Anxious and Depressed  Affect:  Congruent  Thought Process:  Coherent, Goal Directed and Linear  Orientation:  Full (Time, Place, and Person)  Thought Content: WDL and Logical   Suicidal Thoughts:  No  Homicidal Thoughts:  No  Memory:  Immediate;   Fair Recent;   Fair Remote;   Fair  Judgement:  Good  Insight:  Good  Psychomotor Activity:  Normal  Concentration:  Concentration: Fair and Attention Span: Fair  Recall:  [employer] of Knowledge: Good  Language: Good  Akathisia:  No  Handed:  Right  AIMS (if indicated): Not done  Assets:  Communication Skills Desire for Improvement Financial Resources/Insurance Housing  ADL's:  Intact  Cognition: WNL  Sleep:  Good   Screenings: AUDIT   Flowsheet Row Admission (Discharged) from 09/24/2019 in [facility]  Alcohol Use Disorder Identification Test Final Score (AUDIT) 0    GAD-7   Flowsheet Row Video Visit from 06/23/2020 in [facility] Clinical Support from 03/25/2020 in [facility] Clinical Support from 02/03/2020 in  [facility]  Total GAD-7 Score 8 13 17     PHQ2-9   Flowsheet Row Video Visit from 06/23/2020 in [facility] from 03/25/2020 in [facility] from 02/03/2020 in [facility] Counselor from 10/02/2019 in [facility]  PHQ-2 Total Score 5 5 6 5   PHQ-9 Total Score 18 20 21 17     Flowsheet Row Video Visit from 06/23/2020 in [facility] Counselor from 10/02/2019 in [facility] Admission (Discharged) from 09/24/2019 in [facility]  C-SSRS RISK CATEGORY Error: Q7 should not be populated when Q6 is No Error: Q3, 4, or 5 should not be populated when Q2 is No High Risk       Assessment and Plan: Patient notes that his anxiety, depression, and sleep has improved since his last visit. Today patient is agreeable to increase Rexulti 2 mg to 3 mg to help with treatment resistant depression. He will continue all other medications as prescribed  1. Severe episode of recurrent major depressive disorder, without psychotic features ([place])  Continue- hydrOXYzine (ATARAX/VISTARIL) 50 MG tablet; Take 1 tablet (50 mg total) by mouth 3 (three) times daily as needed for itching or anxiety.  Dispense: 90 tablet; Refill: 2 Continue- mirtazapine (REMERON) 30 MG tablet; Take 1 tablet (30 mg total) by mouth at bedtime.  Dispense: 30 tablet; Refill: 2 Start- brexpiprazole (REXULTI) 3 MG TABS tablet; Take 1 tablet (3 mg total) by mouth daily.  Dispense: 30 tablet; Refill: 30  2. Primary insomnia  Continue- mirtazapine (REMERON) 30 MG tablet; Take 1 tablet (30 mg total) by mouth at bedtime.  Dispense: 30 tablet; Refill: 2 Continue- traZODone (DESYREL) 100 MG tablet; Take 1 tablet (100 mg total) by mouth at bedtime as needed for sleep.  Dispense: 30 tablet; Refill:  2   Follow-up in 3 months Follow-up with therapy    Salley Slaughter, NP 06/23/2020, 10:14 AM

## 2020-07-10 ENCOUNTER — Other Ambulatory Visit (HOSPITAL_COMMUNITY): Payer: Self-pay

## 2020-07-20 ENCOUNTER — Other Ambulatory Visit: Payer: Self-pay

## 2020-07-20 MED FILL — Brexpiprazole Tab 3 MG: ORAL | 30 days supply | Qty: 30 | Fill #0 | Status: AC

## 2020-07-26 ENCOUNTER — Other Ambulatory Visit: Payer: Self-pay

## 2020-08-03 ENCOUNTER — Other Ambulatory Visit: Payer: Self-pay

## 2020-08-05 ENCOUNTER — Other Ambulatory Visit: Payer: Self-pay

## 2020-08-06 ENCOUNTER — Other Ambulatory Visit: Payer: Self-pay

## 2020-08-23 ENCOUNTER — Other Ambulatory Visit: Payer: Self-pay

## 2020-08-23 MED FILL — Brexpiprazole Tab 3 MG: ORAL | 30 days supply | Qty: 30 | Fill #1 | Status: AC

## 2020-08-24 ENCOUNTER — Other Ambulatory Visit: Payer: Self-pay

## 2020-08-30 ENCOUNTER — Other Ambulatory Visit (HOSPITAL_COMMUNITY): Payer: Self-pay | Admitting: Psychiatry

## 2020-08-30 DIAGNOSIS — F5101 Primary insomnia: Secondary | ICD-10-CM

## 2020-08-31 ENCOUNTER — Other Ambulatory Visit (HOSPITAL_COMMUNITY): Payer: Self-pay | Admitting: Psychiatry

## 2020-08-31 DIAGNOSIS — F5101 Primary insomnia: Secondary | ICD-10-CM

## 2020-08-31 DIAGNOSIS — F332 Major depressive disorder, recurrent severe without psychotic features: Secondary | ICD-10-CM

## 2020-09-09 ENCOUNTER — Other Ambulatory Visit: Payer: Self-pay

## 2020-09-20 ENCOUNTER — Encounter (HOSPITAL_COMMUNITY): Payer: Self-pay | Admitting: Psychiatry

## 2020-09-20 ENCOUNTER — Other Ambulatory Visit: Payer: Self-pay

## 2020-09-20 ENCOUNTER — Telehealth (INDEPENDENT_AMBULATORY_CARE_PROVIDER_SITE_OTHER): Payer: No Payment, Other | Admitting: Psychiatry

## 2020-09-20 DIAGNOSIS — F5101 Primary insomnia: Secondary | ICD-10-CM

## 2020-09-20 DIAGNOSIS — F332 Major depressive disorder, recurrent severe without psychotic features: Secondary | ICD-10-CM | POA: Diagnosis not present

## 2020-09-20 MED ORDER — TRAZODONE HCL 100 MG PO TABS
ORAL_TABLET | ORAL | 2 refills | Status: DC
  Filled 2020-09-20: qty 30, 30d supply, fill #0

## 2020-09-20 MED ORDER — BREXPIPRAZOLE 3 MG PO TABS
ORAL_TABLET | ORAL | 2 refills | Status: DC
  Filled 2020-09-20: qty 30, 30d supply, fill #0
  Filled 2020-10-20: qty 30, 30d supply, fill #1

## 2020-09-20 MED ORDER — MIRTAZAPINE 45 MG PO TABS
45.0000 mg | ORAL_TABLET | Freq: Every day | ORAL | 2 refills | Status: DC
  Filled 2020-09-20 (×2): qty 30, 30d supply, fill #0
  Filled 2020-10-20 (×3): qty 30, 30d supply, fill #1

## 2020-09-20 MED ORDER — HYDROXYZINE HCL 50 MG PO TABS
ORAL_TABLET | ORAL | 2 refills | Status: DC
  Filled 2020-09-20: qty 90, 30d supply, fill #0
  Filled 2020-10-20: qty 90, 30d supply, fill #1

## 2020-09-20 NOTE — Progress Notes (Signed)
[place] MD/PA/NP OP Progress Note Virtual Visit via Telephone Note  I connected with Casey Silva on 09/20/20 at 11:30 AM EDT by telephone and verified that I am speaking with the correct person using two identifiers.  Location: Patient: home Provider: Clinic   I discussed the limitations, risks, security and privacy concerns of performing an evaluation and management service by telephone and the availability of in person appointments. I also discussed with the patient that there may be a patient responsible charge related to this service. The patient expressed understanding and agreed to proceed.   I provided 30 minutes of non-face-to-face time during this encounter.   09/20/2020 12:14 PM Casey Silva  MRN:  [id number]  Chief Complaint: "Im still struggling"   HPI: A 65 year old male seen today for follow up psychiatric evaluation. He has a psychiatric history of brief psychotic disorder, MDD, and insomnia. He is currently being managed on Trazodone 100mg  nightly, Rexulti 3 mg, Remeron 30mg  nightly, Hydroxyzine 50mg  TID.  He notes his medications are somewhat effective in managing his psychiatric condition.  Today he was unable to login virtually so his exam was done over the phone. During assessment he was pleasant, cooperative, and engaged in conversation. He informed provider that since his last visit he continues to struggle.  He notes that his medications does help him cope however reports that life stressors worsen his mental health conditions.  He informed provider that he did find somewhere else to live however lacked the motivation to move and notes that he currently lives in the same apartment that he has issues with in the past.  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  Patient notes that he wished that he could retire and not worry about homelessness.  He informed provider that he would like to live in a  group home.  Provider gave patient resources to and ACT team.  Patient notes that his anxiety and depression have somewhat improved.  Today provider conducted a PHQ-9 and patient scored an 17, at his last visit he scored a  18.   Provider also conducted a GAD-7 and patient scored an 7, at his last visit he scored a 8.  Patient notes that his sleep is poor.  He notes that at times he wakes up every 2 hours.  He notes that trazodone makes him feel groggy and informed provider that time he does not want to increase it.  He also notes that he has blurred vision.  Provider informed patient that mirtazapine and trazodone together can cause these issues.  He endorsed understanding however notes that he is continuing to improve.  Patient asked about Ambien.  Provider informed patient of the risk and dangers of Ambien.  He notes that at this time he does not want to start it.   He also endorses adequate appetite. He denies SI/HI/VAH or paranoia  Today patient is agreeable to increase mirtazapine 30 mg to 45 mg to help manage anxiety, depression, and sleep. He will continue all other medications as prescribed.  No other concerns noted at this time.   Visit Diagnosis:    ICD-10-CM   1. Primary insomnia  F51.01 traZODone (DESYREL) 100 MG tablet    mirtazapine (REMERON) 45 MG tablet    2. Severe episode of recurrent major depressive disorder, without psychotic features (HCC)  F33.2 Brexpiprazole 3 MG TABS    hydrOXYzine (ATARAX/VISTARIL) 50 MG tablet  mirtazapine (REMERON) 45 MG tablet      Past Psychiatric History: Anxiety, depression, insomnia, brief psychotic disorder and psychosis.   Past Medical History:  Past Medical History:  Diagnosis Date   Anxiety    Depression    History reviewed. No pertinent surgical history.  Family Psychiatric History: Mother bipolar depression   Family History:  Family History  Problem Relation Age of Onset   Bipolar disorder Mother     Social History:   Social History   Socioeconomic History   Marital status: Single    Spouse name: Not on file   Number of children: 1   Years of education: Not on file   Highest education level: GED or equivalent  Occupational History   Not on file  Tobacco Use   Smoking status: Never   Smokeless tobacco: Never  Vaping Use   Vaping Use: Unknown  Substance and Sexual Activity   Alcohol use: No   Drug use: No   Sexual activity: Not Currently  Other Topics Concern   Not on file  Social History Narrative   Marital status:  Single      Children: none      Employment:  Unemployment.  Independently wealthy; previous work for [employer].      Tobacco:  1-2 cigarettes per day since age 58.      Alcohol:  None      Drugs:  none      Exercise:  Regularly very      Religion:  Jewish   Social Determinants of Radio broadcast assistant Strain: Medium Risk   Difficulty of Paying Living Expenses: Somewhat hard  Food Insecurity: No Food Insecurity   Worried About Charity fundraiser in the Last Year: Never true   Ran Out of Food in the Last Year: Never true  Transportation Needs: No Transportation Needs   Lack of Transportation (Medical): No   Lack of Transportation (Non-Medical): No  Physical Activity: Insufficiently Active   Days of Exercise per Week: 2 days   Minutes of Exercise per Session: 30 min  Stress: Stress Concern Present   Feeling of Stress : Rather much  Social Connections: Socially Isolated   Frequency of Communication with Friends and Family: Never   Frequency of Social Gatherings with Friends and Family: Never   Attends Religious Services: Never   Marine scientist or Organizations: No   Attends Archivist Meetings: Never   Marital Status: Divorced    Allergies:  Allergies  Allergen Reactions   Dust Mite Mixed Allergen Ext [Mite (D. Farinae)] Shortness Of Breath   Bee Pollen     Watery Eyes    Metabolic Disorder Labs: No results found for: HGBA1C,  MPG No results found for: PROLACTIN No results found for: CHOL, TRIG, HDL, CHOLHDL, VLDL, LDLCALC Lab Results  Component Value Date   TSH 1.267 09/24/2019    Therapeutic Level Labs: No results found for: LITHIUM No results found for: VALPROATE No components found for:  CBMZ  Current Medications: Current Outpatient Medications  Medication Sig Dispense Refill   mirtazapine (REMERON) 45 MG tablet Take 1 tablet (45 mg total) by mouth at bedtime. 30 tablet 2   aspirin EC 81 MG EC tablet Take 1 tablet (81 mg total) by mouth daily. (May buy from over the counter,Swallow whole): For heart health. 30 tablet 0   Brexpiprazole 3 MG TABS TAKE 1 TABLET (3 MG TOTAL) BY MOUTH DAILY. 30 tablet 2  hydrOXYzine (ATARAX/VISTARIL) 50 MG tablet TAKE 1 TABLET (50 MG TOTAL) BY MOUTH 3 (THREE) TIMES DAILY AS NEEDED FOR ITCHING OR ANXIETY. 90 tablet 2   lidocaine (XYLOCAINE) 2 % solution Use as directed 15 mLs in the mouth or throat as needed for mouth pain. 100 mL 0   lisinopril (ZESTRIL) 10 MG tablet Take 1 tablet (10 mg total) by mouth daily. For high blood pressure (Patient not taking: Reported on 03/25/2020) 30 tablet 0   traZODone (DESYREL) 100 MG tablet TAKE 1 TABLET BY MOUTH ONCE DAILY AT BEDTIME AS NEEDED FOR SLEEP 30 tablet 2   No current facility-administered medications for this visit.     Musculoskeletal: Strength & Muscle Tone:  Unable to assess due to telphone visit Gait & Station: normal, Unable to assess due to telphone visit Patient leans: N/A  Psychiatric Specialty Exam: Review of Systems  There were no vitals taken for this visit.There is no height or weight on file to calculate BMI.  General Appearance:  Unable to assess due to telphone visit  Eye Contact:   Unable to assess due to telphone visit  Speech:  Clear and Coherent and Normal Rate  Volume:  Normal  Mood:  Anxious and Depressed  Affect:  Congruent  Thought Process:  Coherent, Goal Directed and Linear  Orientation:   Full (Time, Place, and Person)  Thought Content: WDL and Logical   Suicidal Thoughts:  No  Homicidal Thoughts:  No  Memory:  Immediate;   Fair Recent;   Fair Remote;   Fair  Judgement:  Good  Insight:  Good  Psychomotor Activity:  Normal  Concentration:  Concentration: Fair and Attention Span: Fair  Recall:  [employer] of Knowledge: Good  Language: Good  Akathisia:  No  Handed:  Right  AIMS (if indicated): Not done  Assets:  Communication Skills Desire for Improvement Financial Resources/Insurance Housing  ADL's:  Intact  Cognition: WNL  Sleep:  Fair   Screenings: AUDIT    Flowsheet Row Admission (Discharged) from 09/24/2019 in [address]  Alcohol Use Disorder Identification Test Final Score (AUDIT) 0      GAD-7    Flowsheet Row Video Visit from 09/20/2020 in [facility] Video Visit from 06/23/2020 in [place] from 03/25/2020 in [place] from 02/03/2020 in [facility]  Total GAD-7 Score 7 8 13 17       PHQ2-9    Flowsheet Row Video Visit from 09/20/2020 in [facility] Video Visit from 06/23/2020 in [place] from 03/25/2020 in [place] from 02/03/2020 in [facility] Counselor from 10/02/2019 in [place]  PHQ-2 Total Score 5 5 5 6 5   PHQ-9 Total Score 17 18 20 21 17       Flowsheet Row Video Visit from 09/20/2020 in [facility] Video Visit from 06/23/2020 in [facility] Counselor from 10/02/2019 in [place] Error: Q7 should not be populated when Q6 is No Error: Q7 should not be populated when Q6 is  No Error: Q3, 4, or 5 should not be populated when Q2 is No        Assessment and Plan: Patient endorses symptoms of anxiety, depression, and insomnia.  Today he is agreeable to  increasing mirtazapine 30 mg to 45 mg to help manage the symptoms.  He will continue all other medications prescribed 1. Severe episode of recurrent major depressive disorder, without psychotic features ([place])  Continue- hydrOXYzine (ATARAX/VISTARIL) 50 MG tablet; Take 1 tablet (50 mg total) by mouth 3 (three) times daily as needed for itching or anxiety.  Dispense: 90 tablet; Refill: 2 Continue- mirtazapine (REMERON) 45 MG tablet; Take 1 tablet (30 mg total) by mouth at bedtime.  Dispense: 45 tablet; Refill: 2 Continue- brexpiprazole (REXULTI) 3 MG TABS tablet; Take 1 tablet (3 mg total) by mouth daily.  Dispense: 30 tablet; Refill: 30  2. Primary insomnia  Continue- mirtazapine (REMERON) 45 MG tablet; Take 1 tablet (45mg  total) by mouth at bedtime.  Dispense: 30 tablet; Refill: 2 Continue- traZODone (DESYREL) 100 MG tablet; Take 1 tablet (100 mg total) by mouth at bedtime as needed for sleep.  Dispense: 30 tablet; Refill: 2   Follow-up in 2 months Follow-up with therapy    Salley Slaughter, NP 09/20/2020, 12:14 PM

## 2020-09-22 ENCOUNTER — Other Ambulatory Visit: Payer: Self-pay

## 2020-10-02 ENCOUNTER — Other Ambulatory Visit (HOSPITAL_COMMUNITY): Payer: Self-pay | Admitting: Psychiatry

## 2020-10-02 DIAGNOSIS — F5101 Primary insomnia: Secondary | ICD-10-CM

## 2020-10-02 DIAGNOSIS — F332 Major depressive disorder, recurrent severe without psychotic features: Secondary | ICD-10-CM

## 2020-10-08 ENCOUNTER — Other Ambulatory Visit: Payer: Self-pay

## 2020-10-13 ENCOUNTER — Other Ambulatory Visit: Payer: Self-pay

## 2020-10-15 ENCOUNTER — Other Ambulatory Visit: Payer: Self-pay

## 2020-10-15 DIAGNOSIS — Z8616 Personal history of COVID-19: Secondary | ICD-10-CM | POA: Insufficient documentation

## 2020-10-15 DIAGNOSIS — B370 Candidal stomatitis: Secondary | ICD-10-CM | POA: Insufficient documentation

## 2020-10-15 MED ORDER — LISINOPRIL 10 MG PO TABS
ORAL_TABLET | ORAL | 1 refills | Status: DC
  Filled 2020-10-15: qty 30, 30d supply, fill #0

## 2020-10-20 ENCOUNTER — Other Ambulatory Visit: Payer: Self-pay

## 2020-10-29 ENCOUNTER — Other Ambulatory Visit (HOSPITAL_COMMUNITY): Payer: Self-pay | Admitting: Psychiatry

## 2020-10-29 DIAGNOSIS — F5101 Primary insomnia: Secondary | ICD-10-CM

## 2020-10-29 DIAGNOSIS — F332 Major depressive disorder, recurrent severe without psychotic features: Secondary | ICD-10-CM

## 2020-11-01 ENCOUNTER — Other Ambulatory Visit: Payer: Self-pay

## 2020-11-01 DIAGNOSIS — R5383 Other fatigue: Secondary | ICD-10-CM | POA: Insufficient documentation

## 2020-11-01 MED ORDER — LOSARTAN POTASSIUM 25 MG PO TABS
ORAL_TABLET | ORAL | 1 refills | Status: DC
  Filled 2020-11-01: qty 30, 30d supply, fill #0

## 2020-11-08 ENCOUNTER — Other Ambulatory Visit: Payer: Self-pay

## 2020-11-22 ENCOUNTER — Encounter (HOSPITAL_COMMUNITY): Payer: Self-pay | Admitting: Psychiatry

## 2020-11-22 ENCOUNTER — Other Ambulatory Visit: Payer: Self-pay

## 2020-11-22 ENCOUNTER — Emergency Department (HOSPITAL_COMMUNITY)
Admission: EM | Admit: 2020-11-22 | Discharge: 2020-11-23 | Disposition: A | Discharge status: Other Acute Inpatient Hospital | Payer: Self-pay | Attending: Emergency Medicine | Admitting: Emergency Medicine

## 2020-11-22 ENCOUNTER — Encounter (HOSPITAL_COMMUNITY): Payer: Self-pay

## 2020-11-22 ENCOUNTER — Ambulatory Visit (HOSPITAL_COMMUNITY)
Admission: EM | Admit: 2020-11-22 | Discharge: 2020-11-22 | Disposition: A | Discharge status: Other Acute Inpatient Hospital | Payer: No Payment, Other | Attending: Emergency Medicine | Admitting: Emergency Medicine

## 2020-11-22 ENCOUNTER — Ambulatory Visit (INDEPENDENT_AMBULATORY_CARE_PROVIDER_SITE_OTHER): Payer: No Payment, Other | Admitting: Psychiatry

## 2020-11-22 VITALS — BP 149/98 | HR 86 | Ht 68.0 in | Wt 175.0 lb

## 2020-11-22 DIAGNOSIS — F323 Major depressive disorder, single episode, severe with psychotic features: Secondary | ICD-10-CM

## 2020-11-22 DIAGNOSIS — R45851 Suicidal ideations: Secondary | ICD-10-CM | POA: Insufficient documentation

## 2020-11-22 DIAGNOSIS — Z046 Encounter for general psychiatric examination, requested by authority: Secondary | ICD-10-CM | POA: Insufficient documentation

## 2020-11-22 DIAGNOSIS — F333 Major depressive disorder, recurrent, severe with psychotic symptoms: Secondary | ICD-10-CM | POA: Diagnosis not present

## 2020-11-22 DIAGNOSIS — R634 Abnormal weight loss: Secondary | ICD-10-CM | POA: Diagnosis not present

## 2020-11-22 DIAGNOSIS — Z9151 Personal history of suicidal behavior: Secondary | ICD-10-CM | POA: Insufficient documentation

## 2020-11-22 DIAGNOSIS — Z20822 Contact with and (suspected) exposure to covid-19: Secondary | ICD-10-CM | POA: Insufficient documentation

## 2020-11-22 DIAGNOSIS — Y900 Blood alcohol level of less than 20 mg/100 ml: Secondary | ICD-10-CM | POA: Insufficient documentation

## 2020-11-22 DIAGNOSIS — Z7982 Long term (current) use of aspirin: Secondary | ICD-10-CM | POA: Insufficient documentation

## 2020-11-22 DIAGNOSIS — Z602 Problems related to living alone: Secondary | ICD-10-CM | POA: Insufficient documentation

## 2020-11-22 DIAGNOSIS — Z79899 Other long term (current) drug therapy: Secondary | ICD-10-CM | POA: Insufficient documentation

## 2020-11-22 LAB — COMPREHENSIVE METABOLIC PANEL
ALT: 13 U/L (ref 0–44)
AST: 17 U/L (ref 15–41)
Albumin: 4.2 g/dL (ref 3.5–5.0)
Alkaline Phosphatase: 57 U/L (ref 38–126)
Anion gap: 13 (ref 5–15)
BUN: 11 mg/dL (ref 8–23)
CO2: 26 mmol/L (ref 22–32)
Calcium: 9.6 mg/dL (ref 8.9–10.3)
Chloride: 96 mmol/L — ABNORMAL LOW (ref 98–111)
Creatinine, Ser: 1.09 mg/dL (ref 0.61–1.24)
GFR, Estimated: >60 mL/min (ref >60)
Glucose, Bld: 152 mg/dL — ABNORMAL HIGH (ref 70–99)
Potassium: 3.6 mmol/L (ref 3.5–5.1)
Sodium: 135 mmol/L (ref 135–145)
Total Bilirubin: 0.7 mg/dL (ref 0.3–1.2)
Total Protein: 7.5 g/dL (ref 6.5–8.1)

## 2020-11-22 LAB — CBC WITH DIFFERENTIAL/PLATELET
Abs Immature Granulocytes: 0.02 10*3/uL (ref 0.00–0.07)
Basophils Absolute: 0.1 10*3/uL (ref 0.0–0.1)
Basophils Relative: 1 %
Eosinophils Absolute: 0.2 10*3/uL (ref 0.0–0.5)
Eosinophils Relative: 3 %
HCT: 46.1 % (ref 39.0–52.0)
Hemoglobin: 15.2 g/dL (ref 13.0–17.0)
Immature Granulocytes: 0 %
Lymphocytes Relative: 26 %
Lymphs Abs: 1.7 10*3/uL (ref 0.7–4.0)
MCH: 28.5 pg (ref 26.0–34.0)
MCHC: 33 g/dL (ref 30.0–36.0)
MCV: 86.5 fL (ref 80.0–100.0)
Monocytes Absolute: 0.4 10*3/uL (ref 0.1–1.0)
Monocytes Relative: 6 %
Neutro Abs: 4.2 10*3/uL (ref 1.7–7.7)
Neutrophils Relative %: 64 %
Platelets: 358 10*3/uL (ref 150–400)
RBC: 5.33 MIL/uL (ref 4.22–5.81)
RDW: 14.6 % (ref 11.5–15.5)
WBC: 6.6 10*3/uL (ref 4.0–10.5)
nRBC: 0 % (ref 0.0–0.2)

## 2020-11-22 LAB — ACETAMINOPHEN LEVEL: Acetaminophen (Tylenol), Serum: <10 ug/mL — ABNORMAL LOW (ref 10–30)

## 2020-11-22 LAB — RAPID URINE DRUG SCREEN, HOSP PERFORMED
Amphetamines: NOT DETECTED
Barbiturates: NOT DETECTED
Benzodiazepines: NOT DETECTED
Cocaine: NOT DETECTED
Opiates: NOT DETECTED
Tetrahydrocannabinol: NOT DETECTED

## 2020-11-22 LAB — RESP PANEL BY RT-PCR (FLU A&B, COVID) ARPGX2
Influenza A by PCR: NEGATIVE
Influenza B by PCR: NEGATIVE
SARS Coronavirus 2 by RT PCR: NEGATIVE

## 2020-11-22 LAB — ETHANOL: Alcohol, Ethyl (B): <10 mg/dL (ref <10)

## 2020-11-22 LAB — SALICYLATE LEVEL: Salicylate Lvl: <7 mg/dL — ABNORMAL LOW (ref 7.0–30.0)

## 2020-11-22 MED ORDER — MIRTAZAPINE 30 MG PO TABS: 45.0000 mg | ORAL_TABLET | Freq: Every day | ORAL | Status: DC

## 2020-11-22 MED ORDER — HYDROXYZINE HCL 25 MG PO TABS: 50.0000 mg | ORAL_TABLET | Freq: Three times a day (TID) | ORAL | Status: DC | PRN

## 2020-11-22 MED ORDER — TRAZODONE HCL 50 MG PO TABS: 100.0000 mg | ORAL_TABLET | Freq: Every evening | ORAL | Status: DC | PRN

## 2020-11-22 MED ORDER — MIRTAZAPINE 30 MG PO TABS
45.0000 mg | ORAL_TABLET | Freq: Every day | ORAL | Status: DC
  Administered 2020-11-22: 45 mg via ORAL
  Filled 2020-11-22 (×2): qty 1

## 2020-11-22 MED ORDER — BREXPIPRAZOLE 3 MG PO TABS: 3.0000 mg | ORAL_TABLET | Freq: Every day | ORAL | Status: DC

## 2020-11-22 MED ORDER — BREXPIPRAZOLE 2 MG PO TABS
3.0000 mg | ORAL_TABLET | Freq: Every day | ORAL | Status: DC
  Administered 2020-11-22: 3 mg via ORAL
  Filled 2020-11-22 (×2): qty 1

## 2020-11-22 MED ORDER — TRAZODONE HCL 50 MG PO TABS
100.0000 mg | ORAL_TABLET | Freq: Every day | ORAL | Status: DC
  Administered 2020-11-22: 100 mg via ORAL
  Filled 2020-11-22: qty 1

## 2020-11-22 NOTE — ED Notes (Signed)
All patient belongings have been inventoried and placed in locker #1 in purple zone.  Pt has been wanded by security.  Phone, car/house keys given to security.  Covid swab has been sent down to the lab.

## 2020-11-22 NOTE — ED Notes (Signed)
Pt changed into scrubs and wanded by security

## 2020-11-22 NOTE — ED Provider Notes (Signed)
[facility] EMERGENCY DEPARTMENT Provider Note   CSN: [id number] Arrival date & time: 11/22/20  2051     History Chief Complaint  Patient presents with   Suicidal    Casey Silva is a 65 y.o. male.  65 yo M with a chief complaint of suicidal ideation.  Is been going on for a long time but worsening over the past month.  Tells me about a month ago he tried to stab himself in the abdomen but did not kill himself.  He used a cutting knife.  He denies any abdominal pain nausea vomiting currently.  He went to behavioral health urgent care and he was sent here as they did not have room for him.  He denies intentional overdose denies illegal drug use.  The history is provided by the patient.  Illness Severity:  Moderate Onset quality:  Gradual Duration:  1 month Timing:  Intermittent Progression:  Waxing and waning Chronicity:  New Associated symptoms: no abdominal pain, no chest pain, no congestion, no diarrhea, no fever, no headaches, no myalgias, no rash, no shortness of breath and no vomiting       Past Medical History:  Diagnosis Date   Anxiety    Depression     Patient Active Problem List   Diagnosis Date Noted   Suicide ideation 11/22/2020   Severe major depression with psychotic features ([place]) 11/22/2020   Primary insomnia 10/20/2019   Severe episode of recurrent major depressive disorder, without psychotic features ([place]) 10/20/2019   Brief psychotic disorder ([place]) 09/25/2019   Psychosis ([place]) 09/24/2019    History reviewed. No pertinent surgical history.     Family History  Problem Relation Age of Onset   Bipolar disorder Mother     Social History   Tobacco Use   Smoking status: Never   Smokeless tobacco: Never  Vaping Use   Vaping Use: Unknown  Substance Use Topics   Alcohol use: No   Drug use: No    Home Medications Prior to Admission medications   Medication Sig Start Date End Date Taking? Authorizing Provider  aspirin EC 81  MG EC tablet Take 1 tablet (81 mg total) by mouth daily. (May buy from over the counter,Swallow whole): For heart health. Patient not taking: Reported on 11/22/2020 09/27/19   Lindell Spar I, NP  Brexpiprazole 3 MG TABS TAKE 1 TABLET (3 MG TOTAL) BY MOUTH DAILY. 09/20/20 09/20/21  Salley Slaughter, NP  hydrOXYzine (ATARAX/VISTARIL) 50 MG tablet TAKE 1 TABLET (50 MG TOTAL) BY MOUTH 3 (THREE) TIMES DAILY AS NEEDED FOR ITCHING OR ANXIETY. 09/20/20 09/20/21  Salley Slaughter, NP  lidocaine (XYLOCAINE) 2 % solution Use as directed 15 mLs in the mouth or throat as needed for mouth pain. Patient not taking: Reported on 11/22/2020 05/29/20   Delia Heady, PA-C  lisinopril (ZESTRIL) 10 MG tablet Take 1 tablet (10 mg total) by mouth daily. For high blood pressure Patient not taking: Reported on 11/22/2020 09/27/19   Lindell Spar I, NP  lisinopril (ZESTRIL) 10 MG tablet Take 1 tablet by mouth daily. Patient not taking: Reported on 11/22/2020 10/15/20     losartan (COZAAR) 25 MG tablet Take 1 tablet by mouth daily. Patient not taking: Reported on 11/22/2020 11/01/20     Melatonin 10 MG TABS Take 10 mg by mouth at bedtime as needed.    [provider]  mirtazapine (REMERON) 30 MG tablet TAKE 1 TABLET BY MOUTH ONCE DAILY AT BEDTIME Patient not taking: Reported on  11/22/2020 10/29/20   Salley Slaughter, NP  mirtazapine (REMERON) 45 MG tablet Take 1 tablet (45 mg total) by mouth at bedtime. 09/20/20   Salley Slaughter, NP  traZODone (DESYREL) 100 MG tablet TAKE 1 TABLET BY MOUTH ONCE DAILY AT BEDTIME AS NEEDED FOR SLEEP 10/29/20   Eulis Canner E, NP    Allergies    Dust mite mixed allergen ext [mite (d. farinae)] and Bee pollen  Review of Systems   Review of Systems  Constitutional:  Negative for chills and fever.  HENT:  Negative for congestion and facial swelling.   Eyes:  Negative for discharge and visual disturbance.  Respiratory:  Negative for shortness of breath.   Cardiovascular:  Negative  for chest pain and palpitations.  Gastrointestinal:  Negative for abdominal pain, diarrhea and vomiting.  Musculoskeletal:  Negative for arthralgias and myalgias.  Skin:  Negative for color change and rash.  Neurological:  Negative for tremors, syncope and headaches.  Psychiatric/Behavioral:  Positive for self-injury and suicidal ideas. Negative for confusion and dysphoric mood. The patient is nervous/anxious.    Physical Exam Updated Vital Signs BP (!) 148/93   Pulse 90   Resp 16   SpO2 98%   Physical Exam Vitals and nursing note reviewed.  Constitutional:      Appearance: He is well-developed.  HENT:     Head: Normocephalic and atraumatic.  Eyes:     Pupils: Pupils are equal, round, and reactive to light.  Neck:     Vascular: No JVD.  Cardiovascular:     Rate and Rhythm: Normal rate and regular rhythm.     Heart sounds: No murmur heard.   No friction rub. No gallop.  Pulmonary:     Effort: No respiratory distress.     Breath sounds: No wheezing.  Abdominal:     General: There is no distension.     Tenderness: There is no abdominal tenderness. There is no guarding or rebound.     Comments: Small hearing scar to the epigastrium.  No abdominal tenderness.  Musculoskeletal:        General: Normal range of motion.     Cervical back: Normal range of motion and neck supple.  Skin:    Coloration: Skin is not pale.     Findings: No rash.  Neurological:     Mental Status: He is alert and oriented to person, place, and time.  Psychiatric:        Behavior: Behavior normal.        Thought Content: Thought content includes suicidal ideation. Thought content does not include homicidal ideation. Thought content does not include homicidal or suicidal plan.    ED Results / Procedures / Treatments   Labs (all labs ordered are listed, but only abnormal results are displayed) Labs Reviewed  COMPREHENSIVE METABOLIC PANEL - Abnormal; Notable for the following components:      Result  Value   Chloride 96 (*)    Glucose, Bld 152 (*)    All other components within normal limits  ACETAMINOPHEN LEVEL - Abnormal; Notable for the following components:   Acetaminophen (Tylenol), Serum <10 (*)    All other components within normal limits  SALICYLATE LEVEL - Abnormal; Notable for the following components:   Salicylate Lvl <3.7 (*)    All other components within normal limits  RESP PANEL BY RT-PCR (FLU A&B, COVID) ARPGX2  CBC WITH DIFFERENTIAL/PLATELET  ETHANOL  RAPID URINE DRUG SCREEN, HOSP PERFORMED    EKG None  Radiology No results found.  Procedures Procedures   Medications Ordered in ED Medications  traZODone (DESYREL) tablet 100 mg (has no administration in time range)  mirtazapine (REMERON) tablet 45 mg (has no administration in time range)  brexpiprazole (REXULTI) tablet 3 mg (has no administration in time range)  hydrOXYzine (ATARAX/VISTARIL) tablet 50 mg (has no administration in time range)  hydrOXYzine (ATARAX/VISTARIL) tablet 50 mg (has no administration in time range)  traZODone (DESYREL) tablet 100 mg (has no administration in time range)    ED Course  I have reviewed the triage vital signs and the nursing notes.  Pertinent labs & imaging results that were available during my care of the patient were reviewed by me and considered in my medical decision making (see chart for details).    MDM Rules/Calculators/A&P                           65 yo M with a chief complaints of suicidal ideation.  Patient was seen at behavioral health urgent care and unable to find a bed for him at this time.  Felt he met criteria for psychiatric admission.  Patient without medical complaint.  Placed in psych ops.  Feel he is medically clear.  The patients results and plan were reviewed and discussed.   Any x-rays performed were independently reviewed by myself.   Differential diagnosis were considered with the presenting HPI.  Medications  traZODone (DESYREL)  tablet 100 mg (has no administration in time range)  mirtazapine (REMERON) tablet 45 mg (has no administration in time range)  brexpiprazole (REXULTI) tablet 3 mg (has no administration in time range)  hydrOXYzine (ATARAX/VISTARIL) tablet 50 mg (has no administration in time range)  hydrOXYzine (ATARAX/VISTARIL) tablet 50 mg (has no administration in time range)  traZODone (DESYREL) tablet 100 mg (has no administration in time range)    Vitals:   11/22/20 2118  BP: (!) 148/93  Pulse: 90  Resp: 16  SpO2: 98%    Final diagnoses:  Suicidal ideation    Final Clinical Impression(s) / ED Diagnoses Final diagnoses:  Suicidal ideation    Rx / DC Orders ED Discharge Orders     None        Deno Etienne, DO 11/22/20 2342

## 2020-11-22 NOTE — Discharge Instructions (Addendum)
Patient to be transferred to [facility] ED for further observation/safety monitoring while inpatient psychiatric treatment is sought for the patient.
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
## 2020-11-22 NOTE — ED Provider Notes (Signed)
Behavioral Health Urgent Care Medical Screening Exam  Patient Name: Casey Silva MRN: [id number] Date of Evaluation: 11/22/20 Chief Complaint:  Suicidal, Hallucinations  Diagnosis:  Final diagnoses:  Severe episode of recurrent major depressive disorder, with psychotic features ([place])  Suicidal ideation   Disposition:  Based on patient's reported recent suicide attempt (patient states that he stabbed himself in his abdomen last week as a suicide attempt), patient's current active SI with intent, multiple plans (patient states he has SI with plan to stab himself again or to purposefully crash his car), means to carry out, and access to means, believe that the patient is a serious threat to himself at this time and thus believe that the patient meets inpatient psychiatric treatment criteria.  Recommend inpatient psychiatric treatment for the patient.  Patient is unable to be admitted to the [facility] at this time due to psychosis (AVH).  No appropriate beds available for the patient at [facility] at this time.  Thus, will transfer the patient to [facility] for further observation/safety monitoring and further crisis stabilization and social work will seek placement for inpatient psychiatric treatment for the patient while the patient is in the emergency department.  Patient is agreeable to this plan above. Provider report given to Dr. Almyra Free and Dr. Almyra Free has agreed to accept the patient.  EMTALA form completed.  Notified Dr. Almyra Free that the EDP assuming care of the patient in the emergency department should place a TTS consult for the patient so that inpatient psychiatric treatment can begin to be sought for the patient. Dr. Almyra Free verbalizes understanding and agreement of this plan.  Patient states that he has not taken any of his home medications today.  Recommend that the patient receive his following home medications while he is in the emergency department, starting tonight in the emergency  department:  -Trazodone 100 mg p.o. once daily at bedtime as needed for sleep  -Remeron 45 mg p.o. daily at bedtime for depression/MDD  -Rexulti 3 mg p.o. daily at bedtime for MDD, psychotic features  -Vistaril 50 mg p.o. 3 times daily as needed for anxiety  History of Present illness: Casey Silva is a 65 y.o. male with past psychiatric history significant for MDD with and without psychotic features, insomnia, psychosis, numbers psychotic disorder, who presents to the [facility] behavioral health urgent care unaccompanied as a voluntary walk-in after attending an outpatient psychotropic medication management appointment on the second floor of the [facility].  Per chart review, patient sees Dr. Ronne Binning for outpatient psychotropic medication management at the [facility] and per chart review, patient had a medication management appointment with Dr. Ronne Binning earlier today with 11/22/2020.  Per chart review of this 11/22/2020 visit, patient reported suicidal ideation and patient was recommended to go to the [facility] behavioral health urgent care for further emergent mental health evaluation.  Patient states that "my situation has gotten much worse.  The last few weeks have been really going downhill.  I am having more difficulty just doing regular things.  I am having trouble with getting up in the morning and taking a shower.  I showered yesterday, but this was the first time in the shower for 5 days".  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  Patient's abdomen examined by myself, which shows a small, 1 to 2 mm hyperpigmented scar-type appearing lesion in the center patient is adamant about 3 to 6 mm above patient's  umbilicus, with no signs of apparent surrounding erythema, drainage, or signs of infection noted (patient states that this lesion is where he stabbed himself last week with a hunting knife).  Patient does state that he still has this hunting knife at home.  Patient denies access to firearms, although patient does state that he is thinking about purchasing a firearm at this time. Patient endorses active SI currently on exam with intent and plan to either stab himself again with the hunting knife or to crash his car on purpose.  Aside from the patient's reported suicide attempt of stabbing himself in his abdomen last week, patient denies any additional suicide attempts in the past and patient denies any additional history of self-injurious behavior via cutting himself.  Patient denies any history of intentionally burning himself.  He denies HI.  Patient endorses visual hallucinations currently on exam, stating that he is seeing birds and various types of animals in the floor of the exam room.  Patient also reports that over the past few weeks, he has been having visual hallucinations at home frequently when he is watching television the patient describes these visual hallucinations as "when I watch TV, it becomes 3D".  When patient is asked to explain this further, patient states that frequently when he is watching television at home, the images on the television screen will appear to come out of the television screen into the room he was sitting at home.  Patient denies auditory hallucinations currently on exam at this time, the patient does endorse.  "Nasty laughter and people knocking me and laughing at me" frequently over the past few weeks.  When patient is asked if he ever hears voices that make specific statements to him, patient states that it is difficult for him to determine if the voices are laughing at him or talking to him most of the time.  Patient also endorses paranoia, but patient states that his  paranoia is not directed at anybody in general and patient cannot provide further details regarding his paranoia.  Per chart review of patient's most recent 11/22/20 outpatient psychotropic medication management visit, patient's current home psychotropic medication regimen consists of the following: Trazodone 100 mg p.o. nightly as needed, Rexulti 3 mg p.o. daily, Remeron 45 mg p.o. nightly, and Vistaril 50 mg p.o. 3 times daily as needed.  Patient reports that he is taking all 4 of the psychotropic medications as prescribed and he states that he takes all 4 of these medications at nighttime.  Patient states that he has not taken any of these for psychotropic medications yet today.  Patient states that he is not taking any additional psychotropic medications at home or any additional home medications for medical reasons at this time.  Patient endorses poor sleep, stating that he is only sleeping about 2 hours per night.  He endorses anhedonia, as well as feelings of guilt, hopelessness, and worthlessness.  He endorses poor energy/fatigue, poor concentration and focus, and decreased appetite.  He endorses 8 pound weight loss over the past 2 months.  Patient states that he has been eating hot dogs were all recently because he cannot find motivation to cook.  Patient endorses lack of motivation secondary to his depression to the point that he is having issues with getting out  of bed in the morning, showering, and going grocery shopping.  Patient states that he currently lives alone in [place] in a boardinghouse and patient states that he would like to ultimately be placed in a group home.  Patient states that he is currently employed for [employer] which she states has increased his depression even more due to the nature of his job.  Patient reports that he has been psychiatrically hospitalized once in the past.  Per chart review, patient was admitted to [facility] from 09/24/2019 to 09/26/2019.   Patient states that he is not currently seeing a therapist at this time.  Patient denies alcohol, tobacco, or illicit substance abuse.  Patient states that he does not have any sources of social support and he states that he does not have any friends.  Patient states that he is "completely alone". Patient cannot contract for safety at this time.  Patient states that he is concerned that if he is to be discharged, he will harm himself again or try to kill himself.  Psychiatric Specialty Exam  Presentation  General Appearance:Appropriate for Environment; Well Groomed  Eye Contact:Good  Speech:Clear and Coherent; Normal Rate  Speech Volume:Decreased  Handedness:No data recorded  Mood and Affect  Mood:Depressed  Affect:Congruent; Flat   Thought Process  Thought Processes:Coherent; Goal Directed; Linear  Descriptions of Associations:Intact  Orientation:Full (Time, Place and Person)  Thought Content:Paranoid Ideation  Diagnosis of Schizophrenia or Schizoaffective disorder in past: No  Duration of Psychotic Symptoms: Greater than six months  Hallucinations:Auditory; Visual (Patient endorses VH currently on exam (see HPI for details). Patient denies AH currently on exam, but does endorse experiencing AH frequently for the past few weeks (see HPI for details).) See HPI for details. See HPI for details.  Ideas of Reference:Paranoia  Suicidal Thoughts:Yes, Active (See HPI for details.) With Intent; With Plan; With Means to [place]; With Access to Means  Homicidal Thoughts:No   Sensorium  Memory:Immediate Good; Recent Good; Remote Good  Judgment:Fair  Insight:Fair   Executive Functions  Concentration:Fair  Attention Span:Fair  [place]  Language:Good   Psychomotor Activity  Psychomotor Activity:Normal   Assets  Assets:Communication Skills; Desire for Improvement; Housing; Leisure Time; Physical Health; Resilience; Transportation;  Vocational/Educational   Sleep  Sleep:Poor  Number of hours: 2   Nutritional Assessment (For OBS and FBC admissions only) Has the patient had a weight loss or gain of 10 pounds or more in the last 3 months?: Yes Has the patient had a decrease in food intake/or appetite?: Yes Does the patient have dental problems?: No Does the patient have eating habits or behaviors that may be indicators of an eating disorder including binging or inducing vomiting?: No Has the patient recently lost weight without trying?: Yes, 2-13 lbs. Has the patient been eating poorly because of a decreased appetite?: Yes Malnutrition Screening Tool Score: 2   Physical Exam: Physical Exam Vitals reviewed.  Constitutional:      General: He is not in acute distress.    Appearance: He is not ill-appearing, toxic-appearing or diaphoretic.  HENT:     Head: Normocephalic and atraumatic.     Right Ear: External ear normal.     Left Ear: External ear normal.     Nose: Nose normal.  Eyes:     General:        Right eye: No discharge.        Left eye: No discharge.     Conjunctiva/sclera: Conjunctivae normal.  Cardiovascular:     Rate and Rhythm: Normal rate.  Pulmonary:     Effort: Pulmonary effort is normal. No respiratory distress.  Musculoskeletal:        General: Normal range of motion.     Cervical back: Normal range of motion.  Skin:    Comments: Patient's abdomen examined by myself, which shows a small, 1 to 2 mm hyperpigmented scar-type appearing lesion in the center patient is adamant about 3 to 6 mm above patient's umbilicus, with no signs of apparent surrounding erythema, drainage, or signs of infection noted (patient states that this lesion is where he stabbed himself last week with a hunting knife).   Neurological:     General: No focal deficit present.     Mental Status: He is alert and oriented to person, place, and time.     Comments: No tremor noted.   Psychiatric:        Mood and Affect:  Mood is depressed.        Speech: Speech normal.        Behavior: Behavior is withdrawn. Behavior is not agitated, slowed, aggressive, hyperactive or combative. Behavior is cooperative.        Thought Content: Thought content is paranoid. Thought content includes suicidal ideation. Thought content does not include homicidal ideation. Thought content includes suicidal plan.     Comments: Affect flat and mood congruent. Patient endorses visual hallucinations currently on exam, stating that he is seeing birds and various types of animals in the floor of the exam room.  Patient also reports that over the past few weeks, he has been having visual hallucinations at home frequently when he is watching television the patient describes these visual hallucinations as "when I watch TV, it becomes 3D".  When patient is asked to explain this further, patient states that frequently when he is watching television at home, the images on the television screen will appear to come out of the television screen into the room he was sitting at home.  Patient denies auditory hallucinations currently on exam at this time, the patient does endorse.  "Nasty laughter and people knocking me and laughing at me" frequently over the past few weeks.  When patient is asked if he ever hears voices that make specific statements to him, patient states that it is difficult for him to determine if the voices are laughing at him or talking to him most of the time.   Review of Systems  Constitutional:  Positive for malaise/fatigue and weight loss. Negative for chills, diaphoresis and fever.  HENT:  Negative for congestion.   Respiratory:  Negative for cough and shortness of breath.   Cardiovascular:  Negative for chest pain and palpitations.  Gastrointestinal:  Negative for abdominal pain, constipation, diarrhea, nausea and vomiting.  Musculoskeletal:  Negative for joint pain and myalgias.  Neurological:  Negative for dizziness, seizures and  headaches.  Psychiatric/Behavioral:  Positive for depression, hallucinations and suicidal ideas. Negative for memory loss and substance abuse. The patient is nervous/anxious and has insomnia.   All other systems reviewed and are negative.  Vitals: Blood pressure (!) 162/106, pulse 84, temperature 98.2 F (36.8 C), temperature source Oral, resp. rate 18, SpO2 99 %. There is no height or weight on file to calculate BMI.  Musculoskeletal: Strength & Muscle Tone: within normal limits Gait & Station: normal Patient leans: N/A   [facility] MSE Discharge Disposition for Follow up and Recommendations: Based on my evaluation I certify that psychiatric inpatient services  furnished can reasonably be expected to improve the patient's condition which I recommend transfer to an appropriate accepting facility.   Based on my evaluation, the patient does not appear to have an emergency medical condition.  Based on patient's reported recent suicide attempt (patient states that he stabbed himself in his abdomen last week as a suicide attempt), patient's current active SI with intent, multiple plans (patient states he has SI with plan to stab himself again or to purposefully crash his car), means to carry out, and access to means, believe that the patient is a serious threat to himself at this time and thus believe that the patient meets inpatient psychiatric treatment criteria.  Recommend inpatient psychiatric treatment for the patient.  Patient is unable to be admitted to the [facility] unit at this time due to psychosis (AVH).  No appropriate beds available for the patient at [facility] at this time.  Thus, will transfer the patient to [facility] for further observation/safety monitoring and further crisis stabilization and social work will seek placement for inpatient psychiatric treatment for the patient while the patient is in the emergency department.  Patient is agreeable to this plan above. Provider report given to  Dr. Almyra Free and Dr. Almyra Free has agreed to accept the patient.  EMTALA form completed.  Notified Dr. Almyra Free that the EDP assuming care of the patient in the emergency department should place a TTS consult for the patient so that inpatient psychiatric treatment can begin to be sought for the patient. Dr. Almyra Free verbalizes understanding and agreement of this plan.  Patient states that he has not taken any of his home medications today.  Recommend that the patient receive his following home medications while he is in the emergency department, starting tonight in the emergency department:  -Trazodone 100 mg p.o. once daily at bedtime as needed for sleep  -Remeron 45 mg p.o. daily at bedtime for depression/MDD  -Rexulti 3 mg p.o. daily at bedtime for MDD, psychotic features  -Vistaril 50 mg p.o. 3 times daily as needed for anxiety  Prescilla Sours, PA-C 11/22/2020, 8:26 PM

## 2020-11-22 NOTE — ED Notes (Signed)
Report given to [facility] RN '@[email or web address]'$  CONE CHARGE NURSE

## 2020-11-22 NOTE — ED Triage Notes (Signed)
Pt comes from [facility] for SI, plans to cut himself, reports AVH, meets inpatient criteria

## 2020-11-22 NOTE — Progress Notes (Signed)
Per [facility], no available beds at this time. Patient meets inpatient criteria per [name], PA. Patient referred to the following facilities:  Destination Service Provider Address Phone [facility]  [address], [place] [phone] [place]  [facility]  [address] [place] [address] [phone]  [facility]  [place] [place] [address] [phone]  [facility]  [place] [address] [phone] [place]  [address] [place] [address] [phone]  [facility]  [address] [place] [address] [phone]  [facility]  [address] [place] [address] [phone]    CSW will continue to monitor disposition.   Signed:  Durenda Hurt, MSW, [place], LCASA 11/22/2020 11:00 PM

## 2020-11-22 NOTE — ED Provider Notes (Addendum)
Emergency Medicine Provider Triage Evaluation Note  Cleophus Wills , a 65 y.o. male  was evaluated in triage.  Pt complains of si with plan. Sent from [facility] for inpatient placement. Pt states he stabbed himself in the abdomen 2 weeks ago. Denies abd pain  Review of Systems  Positive: si Negative: Avh, abd pain  Physical Exam  There were no vitals taken for this visit. Gen:   Awake, no distress   Resp:  Normal effort MSK:   Moves extremities without difficulty   Medical Decision Making  Medically screening exam initiated at 9:00 PM.  Appropriate orders placed.  Laquincy Bry was informed that the remainder of the evaluation will be completed by another provider, this initial triage assessment does not replace that evaluation, and the importance of remaining in the ED until their evaluation is complete.     Tivis Ringer, Diantha Paxson S, PA-C 11/22/20 2101    Kahari Critzer S, PA-C 11/22/20 2102    Deno Etienne, DO 11/22/20 2255

## 2020-11-22 NOTE — BH Assessment (Signed)
URGENT Casey Silva: Patient is a 65 year old male that presents this date with S/I. Patient has a plan to "crash his car" or "something else." Patient denies any H/I although reports active VH stating he "hears bad noises" that are "troubling." Patient denies any VH. Patient will not elaborate on content of VH. Patient reports he attempted self harm two weeks ago by "stabbing himself in the stomach" although reports that incident "just required a band aide." Patient states he resides alone in a boarding home and often isolates. Patient current receives OP care from [facility] MD prescribes medications for Schizophrenia. Patient reports current compliance. Patient reports current stressors to be "everything." Patient is requesting a inpatient admission to assist with stabilization.

## 2020-11-23 ENCOUNTER — Inpatient Hospital Stay (HOSPITAL_COMMUNITY)
Admission: AD | Admit: 2020-11-23 | Discharge: 2020-12-02 | DRG: 885 | Disposition: A | Discharge status: Home / Self Care | Payer: Federal, State, Local not specified - Other | Source: Intra-hospital | Attending: Emergency Medicine | Admitting: Emergency Medicine

## 2020-11-23 ENCOUNTER — Encounter (HOSPITAL_COMMUNITY): Payer: Self-pay | Admitting: Registered Nurse

## 2020-11-23 ENCOUNTER — Other Ambulatory Visit: Payer: Self-pay | Admitting: Registered Nurse

## 2020-11-23 ENCOUNTER — Encounter (HOSPITAL_COMMUNITY): Payer: Self-pay | Admitting: Emergency Medicine

## 2020-11-23 DIAGNOSIS — G8929 Other chronic pain: Secondary | ICD-10-CM | POA: Diagnosis present

## 2020-11-23 DIAGNOSIS — G4700 Insomnia, unspecified: Secondary | ICD-10-CM | POA: Diagnosis present

## 2020-11-23 DIAGNOSIS — F314 Bipolar disorder, current episode depressed, severe, without psychotic features: Secondary | ICD-10-CM | POA: Insufficient documentation

## 2020-11-23 DIAGNOSIS — Z818 Family history of other mental and behavioral disorders: Secondary | ICD-10-CM

## 2020-11-23 DIAGNOSIS — Z9151 Personal history of suicidal behavior: Secondary | ICD-10-CM | POA: Diagnosis not present

## 2020-11-23 DIAGNOSIS — I10 Essential (primary) hypertension: Secondary | ICD-10-CM | POA: Diagnosis present

## 2020-11-23 DIAGNOSIS — K5900 Constipation, unspecified: Secondary | ICD-10-CM | POA: Diagnosis present

## 2020-11-23 DIAGNOSIS — F323 Major depressive disorder, single episode, severe with psychotic features: Secondary | ICD-10-CM | POA: Diagnosis not present

## 2020-11-23 DIAGNOSIS — F411 Generalized anxiety disorder: Secondary | ICD-10-CM | POA: Diagnosis present

## 2020-11-23 DIAGNOSIS — Z7982 Long term (current) use of aspirin: Secondary | ICD-10-CM | POA: Diagnosis not present

## 2020-11-23 DIAGNOSIS — R45851 Suicidal ideations: Secondary | ICD-10-CM | POA: Diagnosis present

## 2020-11-23 DIAGNOSIS — Z91048 Other nonmedicinal substance allergy status: Secondary | ICD-10-CM

## 2020-11-23 DIAGNOSIS — F17210 Nicotine dependence, cigarettes, uncomplicated: Secondary | ICD-10-CM | POA: Diagnosis present

## 2020-11-23 DIAGNOSIS — Z79899 Other long term (current) drug therapy: Secondary | ICD-10-CM | POA: Diagnosis not present

## 2020-11-23 DIAGNOSIS — Z20822 Contact with and (suspected) exposure to covid-19: Secondary | ICD-10-CM | POA: Diagnosis present

## 2020-11-23 MED ORDER — AMLODIPINE BESYLATE 5 MG PO TABS
5.0000 mg | ORAL_TABLET | Freq: Every day | ORAL | Status: DC
  Administered 2020-11-24 – 2020-12-02 (×9): 5 mg via ORAL
  Filled 2020-11-23: qty 7
  Filled 2020-11-23 (×11): qty 1

## 2020-11-23 MED ORDER — ALUM & MAG HYDROXIDE-SIMETH 200-200-20 MG/5ML PO SUSP: 30.0000 mL | ORAL | Status: DC | PRN

## 2020-11-23 MED ORDER — POLYETHYLENE GLYCOL 3350 17 G PO PACK
17.0000 g | PACK | Freq: Every day | ORAL | Status: DC | PRN
  Administered 2020-11-24 (×2): 17 g via ORAL
  Filled 2020-11-23 (×3): qty 1

## 2020-11-23 MED ORDER — BREXPIPRAZOLE 1 MG PO TABS
3.0000 mg | ORAL_TABLET | Freq: Every day | ORAL | Status: DC
  Filled 2020-11-23 (×2): qty 3

## 2020-11-23 MED ORDER — POLYETHYLENE GLYCOL 3350 17 G PO PACK
17.0000 g | PACK | Freq: Once | ORAL | Status: AC
  Administered 2020-11-23: 17 g via ORAL
  Filled 2020-11-23 (×2): qty 1

## 2020-11-23 MED ORDER — MIRTAZAPINE 45 MG PO TABS
45.0000 mg | ORAL_TABLET | Freq: Every day | ORAL | Status: DC
  Administered 2020-11-23: 45 mg via ORAL
  Filled 2020-11-23 (×2): qty 1

## 2020-11-23 MED ORDER — HYDROXYZINE HCL 50 MG PO TABS
50.0000 mg | ORAL_TABLET | Freq: Three times a day (TID) | ORAL | Status: DC | PRN
  Administered 2020-11-23 – 2020-11-24 (×2): 50 mg via ORAL
  Filled 2020-11-23 (×2): qty 1

## 2020-11-23 MED ORDER — BREXPIPRAZOLE 2 MG PO TABS
4.0000 mg | ORAL_TABLET | Freq: Every day | ORAL | Status: DC
  Administered 2020-11-23: 4 mg via ORAL
  Filled 2020-11-23 (×3): qty 2

## 2020-11-23 MED ORDER — TRAZODONE HCL 100 MG PO TABS: 100.0000 mg | ORAL_TABLET | Freq: Every evening | ORAL | Status: DC | PRN

## 2020-11-23 MED ORDER — DOXEPIN HCL 10 MG PO CAPS
10.0000 mg | ORAL_CAPSULE | Freq: Every day | ORAL | Status: DC
  Administered 2020-11-23: 10 mg via ORAL
  Filled 2020-11-23 (×4): qty 1

## 2020-11-23 MED ORDER — MAGNESIUM HYDROXIDE 400 MG/5ML PO SUSP
30.0000 mL | Freq: Every day | ORAL | Status: DC | PRN
Start: 2020-11-23 — End: 2020-12-02

## 2020-11-23 MED ORDER — ACETAMINOPHEN 325 MG PO TABS
650.0000 mg | ORAL_TABLET | Freq: Four times a day (QID) | ORAL | Status: DC | PRN
  Administered 2020-11-26 – 2020-12-02 (×2): 650 mg via ORAL
  Filled 2020-11-23 (×2): qty 2

## 2020-11-23 NOTE — Consult Note (Signed)
   Casey Silva 65 y.o. male patient admitted to [facility] ED after being sent from [facility] where he initially presented with complaints of worsening depression and suicidal ideation with plan and intent.  Patient continues to endorse suicidal ideation and unable to contract for safety.  Continue to recommend inpatient psychiatric treatment.     Patient has been accepted to [facility]  Devonte Migues B. Bexton Haak, NP
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
## 2020-11-23 NOTE — Tx Team (Signed)
Interdisciplinary Treatment and Diagnostic Plan Update  11/23/2020 Time of Session: 9:15am  Casey Silva MRN: [id number]  Principal Diagnosis: Severe major depression with psychotic features [facility])  Secondary Diagnoses: Principal Problem:   Severe major depression with psychotic features ([place])   Current Medications:  Current Facility-Administered Medications  Medication Dose Route Frequency Provider Last Rate Last Admin   acetaminophen (TYLENOL) tablet 650 mg  650 mg Oral Q6H PRN Rankin, Shuvon B, NP       alum & mag hydroxide-simeth (MAALOX/MYLANTA) 200-200-20 MG/5ML suspension 30 mL  30 mL Oral Q4H PRN Rankin, Shuvon B, NP       amLODipine (NORVASC) tablet 5 mg  5 mg Oral Daily Lindon Romp A, NP   5 mg at 11/24/20 0740   doxepin (SINEQUAN) capsule 10 mg  10 mg Oral QHS Lindon Romp A, NP   10 mg at 11/23/20 2145   magnesium hydroxide (MILK OF MAGNESIA) suspension 30 mL  30 mL Oral Daily PRN Rankin, Shuvon B, NP       melatonin tablet 5 mg  5 mg Oral QHS Arthor Captain, MD       mirtazapine (REMERON) tablet 30 mg  30 mg Oral QHS Arthor Captain, MD       polyethylene glycol (MIRALAX / GLYCOLAX) packet 17 g  17 g Oral Daily PRN Lindon Romp A, NP   17 g at 11/24/20 0743   polyethylene glycol (MIRALAX / GLYCOLAX) packet 17 g  17 g Oral NOW Arthor Captain, MD       PTA Medications: Medications Prior to Admission  Medication Sig Dispense Refill Last Dose   aspirin EC 81 MG EC tablet Take 1 tablet (81 mg total) by mouth daily. (May buy from over the counter,Swallow whole): For heart health. (Patient not taking: No sig reported) 30 tablet 0    Brexpiprazole 3 MG TABS TAKE 1 TABLET (3 MG TOTAL) BY MOUTH DAILY. 30 tablet 2    hydrOXYzine (ATARAX/VISTARIL) 50 MG tablet TAKE 1 TABLET (50 MG TOTAL) BY MOUTH 3 (THREE) TIMES DAILY AS NEEDED FOR ITCHING OR ANXIETY. 90 tablet 2    lidocaine (XYLOCAINE) 2 % solution Use as directed 15 mLs in the mouth or throat as needed for mouth pain. (Patient not  taking: No sig reported) 100 mL 0    lisinopril (ZESTRIL) 10 MG tablet Take 1 tablet (10 mg total) by mouth daily. For high blood pressure (Patient not taking: No sig reported) 30 tablet 0    lisinopril (ZESTRIL) 10 MG tablet Take 1 tablet by mouth daily. 30 tablet 1    losartan (COZAAR) 25 MG tablet Take 1 tablet by mouth daily. (Patient not taking: Reported on 11/23/2020) 30 tablet 1    Melatonin 10 MG TABS Take 10 mg by mouth at bedtime as needed.      mirtazapine (REMERON) 30 MG tablet TAKE 1 TABLET BY MOUTH ONCE DAILY AT BEDTIME (Patient not taking: Reported on 11/23/2020) 30 tablet 0    mirtazapine (REMERON) 45 MG tablet Take 1 tablet (45 mg total) by mouth at bedtime. 30 tablet 2    traZODone (DESYREL) 100 MG tablet TAKE 1 TABLET BY MOUTH ONCE DAILY AT BEDTIME AS NEEDED FOR SLEEP (Patient taking differently: Take 100 mg by mouth at bedtime as needed for sleep. TAKE 1 TABLET BY MOUTH ONCE DAILY AT BEDTIME AS NEEDED FOR SLEEP) 30 tablet 0     Patient Stressors: Financial difficulties Health problems  Patient Strengths: Ability for insight Average  or above average intelligence Motivation for treatment/growth Work skills  Treatment Modalities: Medication Management, Group therapy, Case management,  1 to 1 session with clinician, Psychoeducation, Recreational therapy.   Physician Treatment Plan for Primary Diagnosis: Severe major depression with psychotic features ([place]) Long Term Goal(s):     Short Term Goals:    Medication Management: Evaluate patient's response, side effects, and tolerance of medication regimen.  Therapeutic Interventions: 1 to 1 sessions, Unit Group sessions and Medication administration.  Evaluation of Outcomes: Not Met  Physician Treatment Plan for Secondary Diagnosis: Principal Problem:   Severe major depression with psychotic features ([place])  Long Term Goal(s):     Short Term Goals:       Medication Management: Evaluate patient's response, side effects,  and tolerance of medication regimen.  Therapeutic Interventions: 1 to 1 sessions, Unit Group sessions and Medication administration.  Evaluation of Outcomes: Not Met   RN Treatment Plan for Primary Diagnosis: Severe major depression with psychotic features ([place]) Long Term Goal(s): Knowledge of disease and therapeutic regimen to maintain health will improve  Short Term Goals: Ability to remain free from injury will improve, Ability to participate in decision making will improve, Ability to verbalize feelings will improve, Ability to disclose and discuss suicidal ideas, and Ability to identify and develop effective coping behaviors will improve  Medication Management: RN will administer medications as ordered by provider, will assess and evaluate patient's response and provide education to patient for prescribed medication. RN will report any adverse and/or side effects to prescribing provider.  Therapeutic Interventions: 1 on 1 counseling sessions, Psychoeducation, Medication administration, Evaluate responses to treatment, Monitor vital signs and CBGs as ordered, Perform/monitor CIWA, COWS, AIMS and Fall Risk screenings as ordered, Perform wound care treatments as ordered.  Evaluation of Outcomes: Not Met   LCSW Treatment Plan for Primary Diagnosis: Severe major depression with psychotic features ([place]) Long Term Goal(s): Safe transition to appropriate next level of care at discharge, Engage patient in therapeutic group addressing interpersonal concerns.  Short Term Goals: Engage patient in aftercare planning with referrals and resources, Increase social support, Increase emotional regulation, Facilitate acceptance of mental health diagnosis and concerns, Identify triggers associated with mental health/substance abuse issues, and Increase skills for wellness and recovery  Therapeutic Interventions: Assess for all discharge needs, 1 to 1 time with Social worker, Explore available resources and  support systems, Assess for adequacy in community support network, Educate family and significant other(s) on suicide prevention, Complete Psychosocial Assessment, Interpersonal group therapy.  Evaluation of Outcomes: Not Met   Progress in Treatment: Attending groups: No. Participating in groups: No. Taking medication as prescribed: Yes. Toleration medication: Yes. Family/Significant other contact made: Yes, individual(s) contacted:  [place] Patient understands diagnosis: Yes. and No. Discussing patient identified problems/goals with staff: Yes. Medical problems stabilized or resolved: Yes. Denies suicidal/homicidal ideation: Yes. Issues/concerns per patient self-inventory: No.   New problem(s) identified: No, Describe:  None   New Short Term/Long Term Goal(s): medication stabilization, elimination of SI thoughts, development of comprehensive mental wellness plan.   Patient Goals: "To go to a group home and to get stable"   Discharge Plan or Barriers: Patient recently admitted. CSW will continue to follow and assess for appropriate referrals and possible discharge planning.   Reason for Continuation of Hospitalization: Anxiety Depression Hallucinations Medication stabilization Suicidal ideation  Estimated Length of Stay: 3 to 5 days   Attendees: Patient: Casey Silva 11/24/2020   Physician: Lestine Mount, DO 11/24/2020   Nursing:  11/24/2020  RN Care Manager: 11/24/2020   Social Worker: Verdis Frederickson, [place] 11/24/2020   Recreational Therapist:  11/24/2020   Other:  11/24/2020   Other:  11/24/2020   Other: 11/24/2020     Scribe for Treatment Team: Darleen Crocker, [place] 11/23/2020 2:33 PM

## 2020-11-23 NOTE — Tx Team (Signed)
Initial Treatment Plan 11/23/2020 12:37 PM Casey Silva [id number]    PATIENT STRESSORS: Financial difficulties Health problems   PATIENT STRENGTHS: Ability for insight Average or above average intelligence Motivation for treatment/growth Work skills   PATIENT IDENTIFIED PROBLEMS: anxiety  depression  Self harm behaviors  SI  AVH             DISCHARGE CRITERIA:  Ability to meet basic life and health needs Improved stabilization in mood, thinking, and/or behavior Motivation to continue treatment in a less acute level of care Need for constant or close observation no longer present  PRELIMINARY DISCHARGE PLAN: Attend aftercare/continuing care group Outpatient therapy Return to previous living arrangement  PATIENT/FAMILY INVOLVEMENT: This treatment plan has been presented to and reviewed with the patient, Casey Silva.  The patient and family have been given the opportunity to ask questions and make suggestions.  Baron Sane, RN 11/23/2020, 12:37 PM

## 2020-11-23 NOTE — ED Provider Notes (Signed)
Emergency Medicine Observation Re-evaluation Note  Casey Silva is a 65 y.o. male, seen on rounds today.  Pt initially presented to the ED for complaints of Suicidal Currently, the patient is lying in bed and reports he doesn't feel good.  Physical Exam  BP 114/84   Pulse 66   Temp 97.8 F (36.6 C)   Resp 16   SpO2 97%  Physical Exam General: nad Cardiac: rrr Lungs: no tachypnea or resp distress Psych: doesn't feel good.  Flat affect and depressed  ED Course / MDM  EKG:   I have reviewed the labs performed to date as well as medications administered while in observation.  Recent changes in the last 24 hours include none.  Plan  Current plan is for inpt but currently no beds.  Casey Silva is not under involuntary commitment.     Blanchie Dessert, MD 11/23/20 [phone]

## 2020-11-23 NOTE — Progress Notes (Signed)
Patient did not attend the evening speaker AA meeting.  

## 2020-11-23 NOTE — ED Notes (Addendum)
Pt transported to [facility] via [facility]
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
## 2020-11-23 NOTE — ED Notes (Signed)
Safe transport called to transport patient to [facility].

## 2020-11-23 NOTE — Progress Notes (Signed)
Pt accepted to [facility] 403-1    Patient meets inpatient criteria per Earleen Newport, NP   Dr.Amy Nelda Marseille is the attending provider.    Call report to [phone]    Martinique Nickerson, RN @ [facility] ED notified.     Pt scheduled  to arrive at [facility] today by [place], MSW, LCSW-A  10:02 AM 11/23/2020

## 2020-11-23 NOTE — Progress Notes (Signed)
Recreation Therapy Notes  Animal-Assisted Activity (AAA) Program Checklist/Progress Notes Patient Eligibility Criteria Checklist & Daily Group note for Rec Tx Intervention  Date: 8.16.22 Time: 36 Location: [place]  AAA/T Program Assumption of Risk Form signed by Teacher, music or Parent Legal Guardian YES   Patient is free of allergies or severe asthma  YES   Patient reports no fear of animals  YES   Patient reports no history of cruelty to animals YES   Patient understands his/her participation is voluntary  YES   Patient washes hands before animal contact  YES  Patient washes hands after animal contact  YES  Behavioral Response: Attentive  Education: Contractor, Appropriate Animal Interaction   Education Outcome: Acknowledges understanding/In group clarification offered/Needs additional education.   Clinical Observations/Feedback: Pt sat and observed as peers interacted with [place].    Victorino Sparrow, LRT/CTRS        Victorino Sparrow A 11/23/2020 3:52 PM

## 2020-11-23 NOTE — ED Notes (Signed)
Breakfast tray provided to pt at this time.

## 2020-11-24 DIAGNOSIS — F323 Major depressive disorder, single episode, severe with psychotic features: Secondary | ICD-10-CM | POA: Diagnosis not present

## 2020-11-24 LAB — LIPID PANEL
Cholesterol: 237 mg/dL — ABNORMAL HIGH (ref 0–200)
HDL: 51 mg/dL (ref >40)
LDL Cholesterol: 148 mg/dL — ABNORMAL HIGH (ref 0–99)
Total CHOL/HDL Ratio: 4.6 RATIO
Triglycerides: 192 mg/dL — ABNORMAL HIGH (ref <150)
VLDL: 38 mg/dL (ref 0–40)

## 2020-11-24 LAB — HEMOGLOBIN A1C
Hgb A1c MFr Bld: 5.5 % (ref 4.8–5.6)
Mean Plasma Glucose: 111.15 mg/dL

## 2020-11-24 LAB — TSH: TSH: 2.45 u[IU]/mL (ref 0.350–4.500)

## 2020-11-24 MED ORDER — WHITE PETROLATUM EX OINT
TOPICAL_OINTMENT | CUTANEOUS | Status: AC
  Filled 2020-11-24: qty 5

## 2020-11-24 MED ORDER — MIRTAZAPINE 30 MG PO TABS
30.0000 mg | ORAL_TABLET | Freq: Every day | ORAL | Status: DC
  Filled 2020-11-24 (×2): qty 1

## 2020-11-24 MED ORDER — MIRTAZAPINE 15 MG PO TABS
15.0000 mg | ORAL_TABLET | Freq: Every day | ORAL | Status: DC
  Administered 2020-11-24 – 2020-11-29 (×6): 15 mg via ORAL
  Filled 2020-11-24 (×8): qty 1

## 2020-11-24 MED ORDER — LORAZEPAM 0.5 MG PO TABS
0.5000 mg | ORAL_TABLET | Freq: Four times a day (QID) | ORAL | Status: DC | PRN
  Administered 2020-11-24: 0.5 mg via ORAL
  Filled 2020-11-24: qty 1

## 2020-11-24 MED ORDER — POLYETHYLENE GLYCOL 3350 17 G PO PACK
17.0000 g | PACK | ORAL | Status: AC
  Administered 2020-11-24: 17 g via ORAL
  Filled 2020-11-24: qty 1

## 2020-11-24 MED ORDER — TRAZODONE HCL 50 MG PO TABS
50.0000 mg | ORAL_TABLET | Freq: Every evening | ORAL | Status: DC | PRN
  Administered 2020-11-24 – 2020-11-28 (×2): 50 mg via ORAL
  Filled 2020-11-24 (×2): qty 1

## 2020-11-24 MED ORDER — SERTRALINE HCL 25 MG PO TABS
25.0000 mg | ORAL_TABLET | Freq: Every day | ORAL | Status: DC
  Administered 2020-11-25 – 2020-11-26 (×2): 25 mg via ORAL
  Filled 2020-11-24 (×3): qty 1

## 2020-11-24 MED ORDER — HYDROXYZINE HCL 25 MG PO TABS
25.0000 mg | ORAL_TABLET | Freq: Four times a day (QID) | ORAL | Status: DC | PRN
  Administered 2020-11-26 – 2020-12-01 (×7): 25 mg via ORAL
  Filled 2020-11-24: qty 1
  Filled 2020-11-24: qty 10
  Filled 2020-11-24 (×6): qty 1

## 2020-11-24 MED ORDER — POLYETHYLENE GLYCOL 3350 17 G PO PACK
17.0000 g | PACK | ORAL | Status: DC
  Administered 2020-11-24 (×3): 17 g via ORAL
  Filled 2020-11-24 (×4): qty 1

## 2020-11-24 MED ORDER — BISACODYL 10 MG RE SUPP
10.0000 mg | Freq: Once | RECTAL | Status: AC
  Administered 2020-11-24: 10 mg via RECTAL
  Filled 2020-11-24 (×2): qty 1

## 2020-11-24 MED ORDER — BISACODYL 5 MG PO TBEC
10.0000 mg | DELAYED_RELEASE_TABLET | Freq: Every day | ORAL | Status: DC
  Administered 2020-11-24: 10 mg via ORAL
  Filled 2020-11-24 (×3): qty 2

## 2020-11-24 MED ORDER — MELATONIN 5 MG PO TABS
5.0000 mg | ORAL_TABLET | Freq: Every day | ORAL | Status: DC
  Administered 2020-11-24 – 2020-12-01 (×8): 5 mg via ORAL
  Filled 2020-11-24 (×5): qty 1
  Filled 2020-11-24: qty 7
  Filled 2020-11-24 (×3): qty 1

## 2020-11-24 NOTE — Progress Notes (Signed)
Patient reported he had a bowel a huge bowel movement and is feeling much better. Provider notified. Order to transfer to the ED cancelled Per Provider. Support and encouragement provided. Patient alert and oriented by 3 denies SI/HI/A/VH, and verbally contracted for safety.

## 2020-11-24 NOTE — [facility] Group Notes (Signed)
Type of Therapy and Topic:  Group Therapy:  Self-Esteem   Participation Level:  Did not attend    Description of Group: This group addressed positive self-esteem. Patients were given a worksheet with a blank shield. Patients were asked what a shield is and when it is used. Patients were asked to list, draw, or write protective factors in the their lives on their shields. Patients discussed the words, ideas and drawings that they put on their shield. Patients were encouraged to have a daily reflection of positive characteristics/ protective factors.   Therapeutic Goals Patient will verbalize two of their positive qualities Patient will demonstrate insight but naming social supports in their lives Patient will verbalize their feelings when voicing positive self affirmations and when voicing positive affirmations of others Patients will discuss the potential positive impact on their wellness/recovery of focusing on positive traits of self and others.   Summary of Patient Progress:    Did not attend

## 2020-11-24 NOTE — Progress Notes (Signed)
Patient to be transferred to the ED Per provider due to not able to void and constipation. Patient v/s elevated and Patient is very anxious at present.  Provider called ED for report.
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
## 2020-11-24 NOTE — BHH Counselor (Signed)
Adult Comprehensive Assessment  Patient ID: Casey Silva, male   DOB: [birth year], 65 y.o.   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 Pt reports living in a boarding house Physical health (include injuries & life threatening diseases): Pt reports feeling weak and having blurry vision  Social relationships: Pt reports no social relationships  Substance abuse: Pt denies all substance use  Bereavement / Loss: Pt reports no stressors    Living/Environment/Situation:  Living Arrangements: Boarding House  Living conditions (as described by patient or guardian): "I hate it there and it is very depressing"  Who else lives in the home?: No one How long has patient lived in current situation?: 6 years What is atmosphere in current home: Temporary, Other (Comment) ("Depressing.")   Family History:  Marital status: Single Are you sexually active?: No Does patient have children?: No   Childhood History:  By whom was/is the patient raised?: Other (Comment) Additional childhood history information: Did not discuss   Education:  Highest grade of school completed: 12th grade  Currently a student?: No Learning disability?: No   Employment/Work Situation:   Employment situation: Employed, [employer], Printmaker, 1 and a half years  What is the longest time patient has a held a job?: 9 years Where was  the patient employed at that time?: [place] patient ever been in the [employer]?: No   Financial Resources:   Museum/gallery curator resources: Employment, limited income, no medical insurance    Alcohol/Substance Abuse:   What has been your use of drugs/alcohol within the last 12 months?: Pt Denies all substance use Alcohol/Substance Abuse Treatment Hx: Denies past history Has alcohol/substance abuse ever caused legal problems?: No   Social Support System:   Heritage manager System: None Describe Community Support System: No supports, "I am very isolated". Type of faith/religion: Jewish How does patient's faith help to cope with current illness?: Prays daily   Leisure/Recreation:   Do You Have Hobbies?: No "I have Anhedonia so I don't enjoy anything anymore"   Strengths/Needs:   What is the patient's perception of their strengths?: "I am not sure" Patient states they can use these personal strengths during their treatment to contribute to their recovery: Pt did not specify  Patient states these barriers may affect/interfere with their treatment: None  Patient states these barriers may affect their return to the community: None  Other important information patient would like considered in planning for their treatment: None    Discharge Plan:   Currently receiving community mental health services: Yes (established at [facility] for medication management) Patient states concerns and preferences for aftercare planning are: Is interested in therapy through [facility] Patient states they will know when they are safe and ready for discharge when: "When I can get into a group home or information about how to do that" Does patient have access to transportation?: Yes, own car (Currently parked at [facility])  Does patient have financial barriers related to discharge medications?: Yes  Patient description of barriers related to discharge medications: No medical insurance  Will patient be returning to same  living situation after discharge?: Yes   Summary/Recommendations:   Summary and Recommendations (to be completed by the evaluator): Casey Silva is a 65 year old, male, who was admitted to the hospital due to suicidal thoughts, auditory and visual Hallucinations, and worsening depression.  The Pt reports a previous hospitalization to [facility] in 2021.  The Pt reports that he lives in a boarding house in [place].  He states that his living situation is "depressing and lonely".  The Pt reports wanting to be placed in a group home because he states that he is unable to complete certain tasks anymore.  The Pt was unable to tell the CSW what tasks he was unable to complete.  The Pt reports that he has no family or social supports and chooses to isolate on a daily basis.  The Pt refused to talk about his childhood or any previous events that did not concern his mental health.  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  The Pt denies all substance use or previous substance use treatment.  While in the hospital the Pt can benefit from crisis stabilization, medication evaluation, group therapy, psycho-education, case management, and discharge planning. Upon discharge the Pt would like to go to a group home but states that he has no money or medical insurance to complete this task.  CSW offered the Pt outpatient resources to complete this goal.  The Pt reports that he can return to the boarding house while working on this goal.  The Pt would also like to follow up with [facility] for therapy and medication management.     Darleen Crocker. 11/24/2020

## 2020-11-24 NOTE — Progress Notes (Signed)
Recreation Therapy Notes  Date:  8.17.22 Time: 0930 Location: [address] Dayroom  Group Topic: Stress Management  Goal Area(s) Addresses:  Patient will identify positive stress management techniques. Patient will identify benefits of using stress management post d/c.  Intervention: Stress Management  Activity:  Meditation.  LRT played a meditation that focused on calming anxiety.  Meditation spoke of acknowledging your feelings but not letting them take over your thoughts and emotions.  Education:  Stress Management, Discharge Planning.   Education Outcome: Acknowledges Education  Clinical Observations/Feedback: Pt did not attend group session.    Casey Silva Lydia Guiles A 11/24/2020 12:06 PM

## 2020-11-24 NOTE — Progress Notes (Signed)
   11/23/20 2115  Psych Admission Type (Psych Patients Only)  Admission Status Voluntary  Psychosocial Assessment  Patient Complaints Anxiety;Depression  Eye Contact Fair  Facial Expression Anxious;Sullen;Sad;Worried  Affect Anxious;Depressed;Sad;Sullen  Catering manager Activity Slow  Appearance/Hygiene Unremarkable  Behavior Characteristics Cooperative;Anxious  Mood Depressed;Anxious;Sad  Thought Pension scheme manager thinking  Content Blaming self  Delusions None reported or observed  Perception Hallucinations  Hallucination Auditory  Judgment Poor  Confusion None  Danger to Self  Current suicidal ideation? Denies  Danger to Others  Danger to Others None reported or observed   Pt denies SI, HI, VH and pain. Pt endorses hearing noises. "I hear doors slamming and people laughing." Sad and depressed affect. Rates anxiety 8/10 and depression 9/10. Pt has been dealing with severe constipation for the last 5-6 days. "I haven't even had the sensation of wanting to go. I eat and eat and nothing comes out." Pt endorses slight abdominal distension but denies any abdominal cramps or other symptoms from constipation. Pt given Miralax for constipation.

## 2020-11-24 NOTE — Progress Notes (Addendum)
  Patient c/o severe constipation Provider notified Patient on constipation Bowel Elimination protocol implemented this shift still no results. Patient very anxious. Prn ativan 0.5 mg given at 1621 Patient still appears anxious. V/S elevated Provider notified. Support and encouragement provided.   Patient c/o of urinary retention stating he has not voided since  10 AM and is complaining of pain.    11/24/20 0626 11/24/20 1151 11/24/20 1633  Vital Signs  Temp  --  98.7 F (37.1 C) 98.9 F (37.2 C)  Temp Source  --  Oral Oral  Pulse Rate 96 98 (!) 129  Pulse Rate Source Monitor Monitor  --   Resp  --  17  --   BP (!) 157/106 (!) 155/105 (!) 183/136  BP Location Right Arm Right Arm Right Arm  BP Method Automatic Automatic Automatic  Patient Position (if appropriate) Standing Sitting Standing  Oxygen Therapy  SpO2 97 % 99 % 99 %  O2 Device  --  Room Air  --     11/24/20 1647 11/24/20 1650  Vital Signs  Temp  --  99 F (37.2 C)  Temp Source  --  Oral  Pulse Rate (!) 123 (!) 116  Pulse Rate Source Monitor Monitor  Resp  --  18  BP (!) 153/113 (!) 150/106  BP Location Right Arm Right Arm  BP Method Automatic Automatic  Patient Position (if appropriate) Sitting Standing  Oxygen Therapy  SpO2 97 % 96 %  O2 Device Room [employer]

## 2020-11-25 DIAGNOSIS — F323 Major depressive disorder, single episode, severe with psychotic features: Secondary | ICD-10-CM | POA: Diagnosis not present

## 2020-11-25 DIAGNOSIS — F411 Generalized anxiety disorder: Secondary | ICD-10-CM | POA: Diagnosis present

## 2020-11-25 MED ORDER — RISPERIDONE 1 MG PO TABS
1.0000 mg | ORAL_TABLET | Freq: Every day | ORAL | Status: DC
  Administered 2020-11-25: 1 mg via ORAL
  Filled 2020-11-25 (×3): qty 1

## 2020-11-25 NOTE — BHH Group Notes (Signed)
Psychoeducational Group- Identifying Negative Behavioral Patterns. In this group, Probation officer read the Poem ''There's a hole in the sidewalk'' By [employer]. Each Person was asked to identify how this applies to their own mental health. Pt was able to identify triggers which lead to hospitalization, and identify negative patterns in which pt engages in. Pt was attentative and appropriate but did exhibit signs of manipulation and monopolizing of group, and attempted to sabotage writers efforts to identify healthy ways to cope with depression and seek help by stating '' well that third street therapist was awful.''

## 2020-11-25 NOTE — Progress Notes (Signed)
[facility] Group Notes:  (Nursing/MHT/Case Management/Adjunct)  Date:  11/25/2020  Time:  2015 Type of Therapy:   wrap up group  Participation Level:  Active  Participation Quality:  Appropriate, Attentive, Sharing, and Supportive  Affect:  Depressed and Flat  Cognitive:  Alert  Insight:  Improving  Engagement in Group:  Engaged  Modes of Intervention:  Clarification, Education, and Support  Summary of Progress/Problems: Pt shares wanting to move on from past regret and would like to change his job but not sure if possible. Positive thinking and self care were discussed.   Shellia Cleverly 11/25/2020, 9:09 PM

## 2020-11-25 NOTE — Progress Notes (Signed)
Casey Silva was pleasant and cooperative.  He continued to voice ongoing depression is 9/10 and anxiety is 10/10 (10 the worst).  He denied SI or HI.  He did report "psychotic thoughts that make no sense when I'm in bed."  Recheck of BP was 138/97.  He denied any pain or discomfort this shift and appears to be in no physical distress.  He requested trazodone at bedtime and was given per order.  He is currently resting with his eyes closed and appears to be asleep.  Q 15 minute checks maintained for safety.     11/24/20 2111  Psych Admission Type (Psych Patients Only)  Admission Status Voluntary  Psychosocial Assessment  Patient Complaints Anxiety;Depression;Insomnia  Eye Contact Fair  Facial Expression Anxious;Sad;Worried  Affect Anxious;Depressed;Sad;Sullen  Speech Logical/coherent  Interaction Assertive  Motor Activity Slow  Appearance/Hygiene Unremarkable  Behavior Characteristics Cooperative;Anxious  Mood Depressed;Anxious  Thought Process  Coherency Concrete thinking  Content Blaming self  Delusions None reported or observed  Perception Hallucinations  Hallucination Auditory  Judgment Poor  Confusion None  Danger to Self  Current suicidal ideation? Denies  Danger to Others  Danger to Others None reported or observed

## 2020-11-25 NOTE — Progress Notes (Signed)
[facility] MD Progress Note  11/25/2020 5:07 PM Bhargav Barbaro  MRN:  [id number]  Reason for admission:  Donevin Sainsbury is a 65 year old male with a history of major depressive disorder, brief psychotic disorder and insomnia who presented to [facility] as a walk-in on 11/22/2020 and reported worsening symptoms of depression, increased anxiety, increased insomnia and suicidal ideation with thoughts of wrecking his car.   Objective: Medical record reviewed.  Patient's case discussed with nursing staff and members of the treatment team.  I met with and evaluated the patient on the unit today for follow-up.  Patient looks a little better today.  He is calmer and appears less intensely anxious although remains anxious overall.  He continues with somatic focus and perseverates on multiple psychiatric symptoms and expresses concern that he will not be given enough medications.  He reports that constipation was alleviated with MiraLAX treatment yesterday.  His sleep was better last night but he still experienced middle insomnia.  Patient states that auditory hallucinations have decreased a little since admission.  He describes AH as experiencing the buzzing sound made by the light fixture converted into a song in his head.  Patient reports that this phenomenon is less pronounced today.  He denies VH, PI or AI.  Patient states that suicide is always on his mind to end the pain that he is in but he denies intent or plan to harm himself in the hospital.  I discussed the use of sertraline for depression and anxiety as well as the use of a lower dose of mirtazapine to help with sleep as well as depression.  Patient is in agreement with this plan.  I looked at his outpatient psychiatric records.  Outpatient notes indicate that patient did not take the Risperdal started during his inpatient hospitalization at [facility] in June 2021 after discharge.  He has been treated in the past with Seroquel 100 mg at bedtime which was darted in August 2021 and  was discontinued when Rexulti was initiated in December 2021.  Per outpatient notes, patient's report of his depressive symptoms appears to be unchanged regardless of the medications tried over the last year.  It does not appear that he has had a recent trial of olanzapine or a trial of risperidone since discharge in June 2021.  I discussed starting low-dose risperidone at bedtime tonight to target hallucinations and help mood.  Patient is receptive to treatment with risperidone. Staff record the patient slept 6.75 hours last night.  Labs today.  Vital signs this morning in include BP of 140/95 sitting and 146/98 standing; pulse of 86 sitting and 91 standing; O2 sat of 96% and temperature of 98.4.  EKG performed yesterday evening showed sinus tachycardia with ventricular rate of 103 and QT/QTc of 350/458.  Principal Problem: Severe major depression with psychotic features ([place]) Diagnosis: Principal Problem:   Severe major depression with psychotic features ([place]) Active Problems:   GAD (generalized anxiety disorder)  Total Time spent with patient:  25 minutes  Past Psychiatric History: See admission H&P  Past Medical History:  Past Medical History:  Diagnosis Date   Anxiety    Depression    History reviewed. No pertinent surgical history. Family History:  Family History  Problem Relation Age of Onset   Bipolar disorder Mother    Family Psychiatric  History: See admission H&P Social History:  Social History   Substance and Sexual Activity  Alcohol Use No     Social History   Substance and Sexual Activity  Drug Use No    Social History   Socioeconomic History   Marital status: Single    Spouse name: Not on file   Number of children: 1   Years of education: Not on file   Highest education level: GED or equivalent  Occupational History   Not on file  Tobacco Use   Smoking status: Never   Smokeless tobacco: Never  Vaping Use   Vaping Use: Unknown  Substance and Sexual  Activity   Alcohol use: No   Drug use: No   Sexual activity: Not Currently  Other Topics Concern   Not on file  Social History Narrative   Marital status:  Single      Children: none      Employment:  Unemployment.  Independently wealthy; previous work for [employer].      Tobacco:  1-2 cigarettes per day since age 71.      Alcohol:  None      Drugs:  none      Exercise:  Regularly very      Religion:  Jewish   Social Determinants of Radio broadcast assistant Strain: Not on file  Food Insecurity: Not on file  Transportation Needs: Not on file  Physical Activity: Not on file  Stress: Not on file  Social Connections: Not on file   Additional Social History:                         Sleep: Fair  Appetite:  Good  Current Medications: Current Facility-Administered Medications  Medication Dose Route Frequency Provider Last Rate Last Admin   acetaminophen (TYLENOL) tablet 650 mg  650 mg Oral Q6H PRN Rankin, Shuvon B, NP       alum & mag hydroxide-simeth (MAALOX/MYLANTA) 200-200-20 MG/5ML suspension 30 mL  30 mL Oral Q4H PRN Rankin, Shuvon B, NP       amLODipine (NORVASC) tablet 5 mg  5 mg Oral Daily Lindon Romp A, NP   5 mg at 11/25/20 0827   hydrOXYzine (ATARAX/VISTARIL) tablet 25 mg  25 mg Oral Q6H PRN Arthor Captain, MD       magnesium hydroxide (MILK OF MAGNESIA) suspension 30 mL  30 mL Oral Daily PRN Rankin, Shuvon B, NP       melatonin tablet 5 mg  5 mg Oral QHS Arthor Captain, MD   5 mg at 11/24/20 2111   mirtazapine (REMERON) tablet 15 mg  15 mg Oral QHS Arthor Captain, MD   15 mg at 11/24/20 2111   polyethylene glycol (MIRALAX / GLYCOLAX) packet 17 g  17 g Oral Daily PRN Lindon Romp A, NP   17 g at 11/24/20 1730   risperiDONE (RISPERDAL) tablet 1 mg  1 mg Oral QHS Arthor Captain, MD       sertraline (ZOLOFT) tablet 25 mg  25 mg Oral Daily Arthor Captain, MD   25 mg at 11/25/20 0827   traZODone (DESYREL) tablet 50 mg  50 mg Oral QHS PRN Arthor Captain, MD   50 mg at 11/24/20 2223    Lab Results:  Results for orders placed or performed during the hospital encounter of 11/23/20 (from the past 48 hour(s))  Lipid panel     Status: Abnormal   Collection Time: 11/24/20  6:13 AM  Result Value Ref Range   Cholesterol 237 (H) 0 - 200 mg/dL   Triglycerides 192 (H) <150 mg/dL  HDL 51 >40 mg/dL   Total CHOL/HDL Ratio 4.6 RATIO   VLDL 38 0 - 40 mg/dL   LDL Cholesterol 148 (H) 0 - 99 mg/dL    Comment:        Total Cholesterol/HDL:CHD Risk Coronary Heart Disease Risk Table                     Men   Women  1/2 Average Risk   3.4   3.3  Average Risk       5.0   4.4  2 X Average Risk   9.6   7.1  3 X Average Risk  23.4   11.0        Use the calculated Patient Ratio above and the CHD Risk Table to determine the patient's CHD Risk.        ATP III CLASSIFICATION (LDL):  <100     mg/dL   Optimal  100-129  mg/dL   Near or Above                    Optimal  130-159  mg/dL   Borderline  160-189  mg/dL   High  >190     mg/dL   Very High Performed at [facility] [address], [place], [place] [address]   Hemoglobin A1c     Status: None   Collection Time: 11/24/20  6:13 AM  Result Value Ref Range   Hgb A1c MFr Bld 5.5 4.8 - 5.6 %    Comment: (NOTE) Pre diabetes:          5.7%-6.4%  Diabetes:              >6.4%  Glycemic control for   <7.0% adults with diabetes    Mean Plasma Glucose 111.15 mg/dL    Comment: Performed at [facility] [address], [place], [place] [address]  TSH     Status: None   Collection Time: 11/24/20  6:13 AM  Result Value Ref Range   TSH 2.450 0.350 - 4.500 uIU/mL    Comment: Performed by a 3rd Generation assay with a functional sensitivity of <=0.01 uIU/mL. Performed at [facility], [place] [address], [place], [place] [address]     Blood Alcohol level:  Lab Results  Component Value Date   ETH <10 11/22/2020   ETH <10 95/18/8416    Metabolic  Disorder Labs: Lab Results  Component Value Date   HGBA1C 5.5 11/24/2020   MPG 111.15 11/24/2020   No results found for: PROLACTIN Lab Results  Component Value Date   CHOL 237 (H) 11/24/2020   TRIG 192 (H) 11/24/2020   HDL 51 11/24/2020   CHOLHDL 4.6 11/24/2020   VLDL 38 11/24/2020   LDLCALC 148 (H) 11/24/2020    Physical Findings: AIMS: Facial and Oral Movements Muscles of Facial Expression: None, normal Lips and Perioral Area: None, normal Jaw: None, normal Tongue: None, normal,Extremity Movements Upper (arms, wrists, hands, fingers): None, normal Lower (legs, knees, ankles, toes): None, normal, Trunk Movements Neck, shoulders, hips: None, normal, Overall Severity Severity of abnormal movements (highest score from questions above): None, normal Incapacitation due to abnormal movements: None, normal Patient's awareness of abnormal movements (rate only patient's report): No Awareness, Dental Status Current problems with teeth and/or dentures?: Yes Does patient usually wear dentures?: No  CIWA:    COWS:     Musculoskeletal: Strength & Muscle Tone: within normal limits Gait & Station: normal  Patient leans: N/A  Psychiatric Specialty Exam:  Presentation  General Appearance: Appropriate for Environment  Eye Contact:Good  Speech:Clear and Coherent  Speech Volume:Normal  Handedness:Right   Mood and Affect  Mood:Anxious; Depressed  Affect:Congruent; Constricted   Thought Process  Thought Processes:Coherent; Goal Directed  Descriptions of Associations:Intact  Orientation:Full (Time, Place and Person)  Thought Content:Rumination (Somatic focus)  History of Schizophrenia/Schizoaffective disorder:No  Duration of Psychotic Symptoms:Greater than six months  Hallucinations:Hallucinations: Auditory Description of Auditory Hallucinations: Hears music Description of Visual Hallucinations: denies VH  Ideas of Reference:None  Suicidal Thoughts:Suicidal  Thoughts: Yes, Passive SI Passive Intent and/or Plan: Without Intent; Without Plan  Homicidal Thoughts:Homicidal Thoughts: No   Sensorium  Memory:Immediate Good; Recent Good  Judgment:Impaired  Insight:Fair   Executive Functions  Concentration:Fair  Attention Span:Fair  Gutierrez  Language:Good   Psychomotor Activity  Psychomotor Activity:Psychomotor Activity: Normal   Assets  Assets:Communication Skills; Desire for Improvement; Housing; Vocational/Educational; Transportation   Sleep  Sleep:Sleep: Fair Number of Hours of Sleep: 6.75    Physical Exam: Physical Exam Vitals and nursing note reviewed.  Constitutional:      General: He is not in acute distress.    Appearance: Normal appearance. He is not diaphoretic.  HENT:     Head: Normocephalic and atraumatic.  Cardiovascular:     Rate and Rhythm: Normal rate.  Pulmonary:     Effort: Pulmonary effort is normal.  Neurological:     General: No focal deficit present.     Mental Status: He is alert and oriented to person, place, and time.   Review of Systems  Constitutional:  Negative for chills, diaphoresis and fever.  HENT:  Negative for sore throat.   Respiratory:  Negative for cough and shortness of breath.   Cardiovascular:  Negative for chest pain and palpitations.  Gastrointestinal:  Negative for abdominal pain, constipation, diarrhea, nausea and vomiting.  Genitourinary:  Negative for dysuria.  Musculoskeletal:  Positive for joint pain. Negative for myalgias.       Positive for chronic arthritic pain in hands bilaterally  Skin:  Negative for rash.  Neurological:  Negative for tremors and seizures.  Psychiatric/Behavioral:  Positive for depression, hallucinations and suicidal ideas. The patient is nervous/anxious and has insomnia.    Blood pressure (!) 146/98, pulse 91, temperature 98.4 F (36.9 C), temperature source Oral, resp. rate 18, height 5' 8"  (1.727 m), weight  79.8 kg, SpO2 95 %. Body mass index is 26.76 kg/m.   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  He continues with significant anxiety, worry, depressed mood and suicidal ideation.  Sleep has improved although subjectively patient reports continued difficulty with insomnia.  Patient reports auditory hallucinations have decreased but are still present.  Daily contact with patient to assess and evaluate symptoms and progress in treatment and Medication management  Major depressive disorder, recurrent -Continue Zoloft 25 mg daily for depression and anxiety.  Anticipate upward titration. -Continue mirtazapine 15 mg at bedtime for mood and sleep -Chart Risperdal 1 mg at bedtime for mood and hallucinations  Anxiety -Continue Zoloft 25 mg daily for depression and anxiety.  Anticipate upward titration. -Start hydroxyzine 25 mg Q6H PRN anxiety  Insomnia -Continue melatonin 5 mg nightly -Continue trazodone 50 mg nightly PRN  Hypertension -Continue Norvasc 5 mg daily  Discharge planning in progress  Arthor Captain, MD 11/25/2020, 5:07 PM

## 2020-11-25 NOTE — Progress Notes (Signed)
Pt did not attend group, he said he was tired.

## 2020-11-25 NOTE — Progress Notes (Signed)
Pt is alert and orientedx4, is calm, cooperative, sad affect, depressed mood, reports passive SI without plan or intent and verbally contracts for safety. Pt reports auditory hallucinations, not of the command type. Pt is medication compliant. Will continue to monitor pt per Q15 minute face checks and monitor for safety and progress.

## 2020-11-25 NOTE — B[facility] Counselor (Signed)
CSW provided the Pt with a packet of resources that includes: housing and shelter resources, free/reduced price food resources, information about the [facility], information about how to apply for Medicaid, [employer] cards, and suicide prevention information.

## 2020-11-25 NOTE — B[facility] Group Notes (Signed)
Adult Psychoeducational Group Note  Date:  11/25/2020 Time:  11:59 AM  Group Topic/Focus:  Goals Group:   The focus of this group is to help patients establish daily goals to achieve during treatment and discuss how the patient can incorporate goal setting into their daily lives to aide in recovery.  Participation Level:  Minimal  Participation Quality:  Appropriate  Affect:  Appropriate  Cognitive:  Appropriate  Insight: Good  Engagement in Group:  Limited  Modes of Intervention:  Orientation  Additional Comments:  Goal is to tweak meds to help with constipation   Chase Picket 11/25/2020, 11:59 AM

## 2020-11-25 NOTE — [facility] Suicide Risk Assessment (Signed)
[place] INPATIENT:  Family/Significant Other Suicide Prevention Education  Suicide Prevention Education:  Patient Refusal for Family/Significant Other Suicide Prevention Education: The patient Marsel Bazen has refused to provide written consent for family/significant other to be provided Family/Significant Other Suicide Prevention Education during admission and/or prior to discharge.  Physician notified.  Pt provided CSW with a phone number to his boarding house.  CSW contacted this number and was told that the residence was a student housing and that the Pt did not live there.  The Pt reports no other contacts to the CSW.  Frutoso Chase Cinderella Christoffersen 11/25/2020, 2:05 PM

## 2020-11-26 DIAGNOSIS — F323 Major depressive disorder, single episode, severe with psychotic features: Secondary | ICD-10-CM | POA: Diagnosis not present

## 2020-11-26 MED ORDER — RISPERIDONE 0.5 MG PO TABS
1.5000 mg | ORAL_TABLET | Freq: Every day | ORAL | Status: DC
  Administered 2020-11-26 – 2020-12-01 (×6): 1.5 mg via ORAL
  Filled 2020-11-26: qty 3
  Filled 2020-11-26: qty 21
  Filled 2020-11-26 (×5): qty 3

## 2020-11-26 MED ORDER — SERTRALINE HCL 25 MG PO TABS
25.0000 mg | ORAL_TABLET | Freq: Once | ORAL | Status: AC
  Administered 2020-11-26: 25 mg via ORAL
  Filled 2020-11-26: qty 1

## 2020-11-26 MED ORDER — SERTRALINE HCL 50 MG PO TABS
50.0000 mg | ORAL_TABLET | Freq: Every day | ORAL | Status: DC
  Administered 2020-11-27 – 2020-12-02 (×6): 50 mg via ORAL
  Filled 2020-11-26 (×3): qty 1
  Filled 2020-11-26: qty 7
  Filled 2020-11-26 (×4): qty 1

## 2020-11-26 NOTE — Progress Notes (Signed)
[facility] MD Progress Note  11/26/2020 4:43 PM Casey Silva  MRN:  [id number]  Reason for admission:  Casey Silva is a 65 year old male with a history of major depressive disorder, brief psychotic disorder and insomnia who presented to [facility] as a walk-in on 11/22/2020 and reported worsening symptoms of depression, increased anxiety, increased insomnia and suicidal ideation with thoughts of wrecking his car.   Objective: Medical record reviewed.  Patient's case discussed with nursing staff and members of the treatment team.  I met with and evaluated the patient on the unit today for follow-up.  Patient appears improved today.  He looks more relaxed and less anxious although still presents as anxious overall.  Patient subjectively reports that his mood is a little bit better, less depressed.  He states that auditory hallucinations have decreased when asked to describe [place] patient gives the example of hearing a loud noise that sounds like a door closing.  Patient does not appear to attend or respond to internal stimuli during our conversation.  He reports decreased intensity and frequency of suicidal ideation.  Patient denies any thoughts of harming himself on the unit.  He continues to experience fleeting thoughts of crashing his car but says that he is able to distract himself from these thoughts by thinking of a better time in his life.  Patient denies AI, HI, VH or PI.  He reports good appetite.  Patient continues to report early and middle insomnia although nursing staff document that he slept 6.75 hours last night.  We discussed increasing his risperidone dose tonight and increasing his Zoloft dose in order to better treat his depression, anxiety and reported psychotic symptoms.  Patient is in agreement with this plan.  He denies any physical issues other than chronic pain due to his arthritis which is in his hands bilaterally.  Vital signs this morning include BP of 156/104 sitting and 133/102 standing; pulse of 73  sitting and 80 standing; respirations of 18 and temperature of 98.  No new labs today.  Principal Problem: Severe major depression with psychotic features ([place]) Diagnosis: Principal Problem:   Severe major depression with psychotic features ([place]) Active Problems:   GAD (generalized anxiety disorder)  Total Time spent with patient:  25 minutes  Past Psychiatric History: See admission H&P  Past Medical History:  Past Medical History:  Diagnosis Date   Anxiety    Depression    History reviewed. No pertinent surgical history. Family History:  Family History  Problem Relation Age of Onset   Bipolar disorder Mother    Family Psychiatric  History: See admission H&P Social History:  Social History   Substance and Sexual Activity  Alcohol Use No     Social History   Substance and Sexual Activity  Drug Use No    Social History   Socioeconomic History   Marital status: Single    Spouse name: Not on file   Number of children: 1   Years of education: Not on file   Highest education level: GED or equivalent  Occupational History   Not on file  Tobacco Use   Smoking status: Never   Smokeless tobacco: Never  Vaping Use   Vaping Use: Unknown  Substance and Sexual Activity   Alcohol use: No   Drug use: No   Sexual activity: Not Currently  Other Topics Concern   Not on file  Social History Narrative   Marital status:  Single      Children: none  Employment:  Unemployment.  Independently wealthy; previous work for [employer].      Tobacco:  1-2 cigarettes per day since age 89.      Alcohol:  None      Drugs:  none      Exercise:  Regularly very      Religion:  Jewish   Social Determinants of Radio broadcast assistant Strain: Not on file  Food Insecurity: Not on file  Transportation Needs: Not on file  Physical Activity: Not on file  Stress: Not on file  Social Connections: Not on file   Additional Social History:                          Sleep: Fair  Appetite:  Good  Current Medications: Current Facility-Administered Medications  Medication Dose Route Frequency Provider Last Rate Last Admin   acetaminophen (TYLENOL) tablet 650 mg  650 mg Oral Q6H PRN Rankin, Shuvon B, NP   650 mg at 11/26/20 0810   alum & mag hydroxide-simeth (MAALOX/MYLANTA) 200-200-20 MG/5ML suspension 30 mL  30 mL Oral Q4H PRN Rankin, Shuvon B, NP       amLODipine (NORVASC) tablet 5 mg  5 mg Oral Daily Lindon Romp A, NP   5 mg at 11/26/20 0809   hydrOXYzine (ATARAX/VISTARIL) tablet 25 mg  25 mg Oral Q6H PRN Arthor Captain, MD   25 mg at 11/26/20 0810   magnesium hydroxide (MILK OF MAGNESIA) suspension 30 mL  30 mL Oral Daily PRN Rankin, Shuvon B, NP       melatonin tablet 5 mg  5 mg Oral QHS Arthor Captain, MD   5 mg at 11/25/20 2110   mirtazapine (REMERON) tablet 15 mg  15 mg Oral QHS Arthor Captain, MD   15 mg at 11/25/20 2111   polyethylene glycol (MIRALAX / GLYCOLAX) packet 17 g  17 g Oral Daily PRN Lindon Romp A, NP   17 g at 11/24/20 1730   risperiDONE (RISPERDAL) tablet 1.5 mg  1.5 mg Oral QHS Arthor Captain, MD       [START ON 11/27/2020] sertraline (ZOLOFT) tablet 50 mg  50 mg Oral Daily Arthor Captain, MD       traZODone (DESYREL) tablet 50 mg  50 mg Oral QHS PRN Arthor Captain, MD   50 mg at 11/24/20 2223    Lab Results:  No results found for this or any previous visit (from the past 21 hour(s)).   Blood Alcohol level:  Lab Results  Component Value Date   ETH <10 11/22/2020   ETH <10 75/17/0017    Metabolic Disorder Labs: Lab Results  Component Value Date   HGBA1C 5.5 11/24/2020   MPG 111.15 11/24/2020   No results found for: PROLACTIN Lab Results  Component Value Date   CHOL 237 (H) 11/24/2020   TRIG 192 (H) 11/24/2020   HDL 51 11/24/2020   CHOLHDL 4.6 11/24/2020   VLDL 38 11/24/2020   LDLCALC 148 (H) 11/24/2020    Physical Findings: AIMS: Facial and Oral Movements Muscles of Facial Expression: None,  normal Lips and Perioral Area: None, normal Jaw: None, normal Tongue: None, normal,Extremity Movements Upper (arms, wrists, hands, fingers): None, normal Lower (legs, knees, ankles, toes): None, normal, Trunk Movements Neck, shoulders, hips: None, normal, Overall Severity Severity of abnormal movements (highest score from questions above): None, normal Incapacitation due to abnormal movements: None, normal Patient's awareness of abnormal movements (  rate only patient's report): No Awareness, Dental Status Current problems with teeth and/or dentures?: Yes Does patient usually wear dentures?: No  CIWA:    COWS:     Musculoskeletal: Strength & Muscle Tone: within normal limits Gait & Station: normal Patient leans: N/A  Psychiatric Specialty Exam:  Presentation  General Appearance: Appropriate for Environment; Fairly Groomed  Eye Contact:Good  Speech:Clear and Coherent  Speech Volume:Normal  Handedness:Right   Mood and Affect  Mood:Anxious; Depressed ("A little better")  Affect:Congruent   Thought Process  Thought Processes:Coherent; Goal Directed  Descriptions of Associations:Intact  Orientation:Full (Time, Place and Person)  Thought Content:Rumination  History of Schizophrenia/Schizoaffective disorder:No  Duration of Psychotic Symptoms:Greater than six months  Hallucinations:Hallucinations: Auditory  Ideas of Reference:None  Suicidal Thoughts:Suicidal Thoughts: Yes, Passive SI Passive Intent and/or Plan: Without Intent; Without Plan  Homicidal Thoughts:Homicidal Thoughts: No   Sensorium  Memory:Immediate Good; Recent Good  Judgment:Fair  Insight:Fair   Executive Functions  Concentration:Good  Attention Span:Good  Recall:Good  Fund of Knowledge:Good  Language:Good   Psychomotor Activity  Psychomotor Activity:Psychomotor Activity: Normal   Assets  Assets:Communication Skills; Desire for Improvement; Housing; Vocational/Educational;  Transportation   Sleep  Sleep:Sleep: Good Number of Hours of Sleep: 6.75    Physical Exam: Physical Exam Vitals and nursing note reviewed.  Constitutional:      General: He is not in acute distress.    Appearance: Normal appearance. He is not diaphoretic.  HENT:     Head: Normocephalic and atraumatic.  Cardiovascular:     Rate and Rhythm: Normal rate.  Pulmonary:     Effort: Pulmonary effort is normal.  Neurological:     General: No focal deficit present.     Mental Status: He is alert and oriented to person, place, and time.   Review of Systems  Constitutional:  Negative for chills, diaphoresis and fever.  HENT:  Negative for sore throat.   Respiratory:  Negative for cough and shortness of breath.   Cardiovascular:  Negative for chest pain and palpitations.  Gastrointestinal:  Negative for abdominal pain, constipation, diarrhea, nausea and vomiting.  Genitourinary:  Negative for dysuria.  Musculoskeletal:  Positive for joint pain. Negative for myalgias.       Positive for chronic arthritic pain in hands bilaterally  Skin:  Negative for rash.  Neurological:  Negative for tremors and seizures.  Psychiatric/Behavioral:  Positive for depression, hallucinations and suicidal ideas. The patient is nervous/anxious and has insomnia.    Blood pressure (!) 133/102, pulse 80, temperature 98 F (36.7 C), temperature source Oral, resp. rate 18, height 5' 8"  (1.727 m), weight 79.8 kg, SpO2 95 %. Body mass index is 26.76 kg/m.   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  Patient is improving on current medications.  He continues with anxiety, worry, depressed mood and suicidal ideation.  He reports feeling less depressed, decreased frequency of auditory hallucinations and experiencing less frequent SI.  He denies thoughts of harming himself in the hospital.  Sleep has improved  per staff report although patient subjectively reports continued difficulty with early and middle insomnia.    Daily contact with patient to assess and evaluate symptoms and progress in treatment and Medication management  Major depressive disorder, recurrent -Increase Zoloft to 50 mg daily for depression and anxiety.  -Continue mirtazapine 15 mg at bedtime for mood and sleep -Increase Risperdal to 1.5 mg at bedtime for mood and hallucinations  Anxiety -  Increase Zoloft to 50 mg daily for depression and anxiety.  -Continue hydroxyzine 25 mg Q6H PRN anxiety  Insomnia -Continue melatonin 5 mg nightly -Continue trazodone 50 mg nightly PRN  Hypertension -Continue Norvasc 5 mg daily  Discharge planning in progress  Arthor Captain, MD 11/26/2020, 4:43 PM

## 2020-11-26 NOTE — Progress Notes (Signed)
   11/25/20 2111  Psych Admission Type (Psych Patients Only)  Admission Status Voluntary  Psychosocial Assessment  Patient Complaints Anxiety  Eye Contact Fair  Facial Expression Anxious;Worried  Affect Sullen;Sad;Apprehensive  Speech Logical/coherent  Interaction Forwards little;Minimal;Guarded  Motor Activity Other (Comment) (WDL)  Appearance/Hygiene Unremarkable  Behavior Characteristics Cooperative;Appropriate to situation  Mood Anxious;Sad;Depressed  Thought Process  Coherency Concrete thinking  Content Blaming self  Delusions None reported or observed  Perception Hallucinations  Hallucination Auditory  Judgment Poor  Confusion None  Danger to Self  Current suicidal ideation? Denies  Danger to Others  Danger to Others None reported or observed

## 2020-11-26 NOTE — [facility] Group Notes (Signed)
Type of Therapy and Topic: Group Therapy: Anger Management   Participation Level:  Active  Description of Group: In this group, patients will learn helpful strategies and techniques to manage anger, express anger in alternative ways, change hostile attitudes, and prevent aggressive acts, such as verbal abuse and violence.This group will be process-oriented and eductional, with patients participating in exploration of their own experiences as well as giving and receiving support and challenge from other group members.  Therapeutic Goals: Patient will learn to manage anger. Patient will learn to stop violence or the threat of violence. Patient will learn to develop self control over thoughts and actions. Patient will receive support and feedback from others  Summary of Patient Progress: Therin participated in the opening of group by stating that he was happy that he got to speak with his doctor today but he did not share his experience with anger. The Pt accepted the worksheet and participated in the group discussion.

## 2020-11-26 NOTE — Progress Notes (Signed)
Recreation Therapy Notes  Date: 8.19.22 Time: 0930 Location: [address] Dayroom  Group Topic: Stress Management   Goal Area(s) Addresses:  Patient will actively participate in stress management techniques presented during session.  Patient will successfully identify benefit of practicing stress management post d/c.   Intervention: Relaxation exercise with ambient sound and script   Activity: Guided Imagery. LRT provided education, instruction, and demonstration on practice of visualization via guided imagery. Patient was asked to participate in the technique introduced during session. Patients were given suggestions of ways to access scripts post d/c and encouraged to explore Youtube and other apps available on smartphones, tablets, and computers.  Education:  Stress Management, Discharge Planning.   Education Outcome: Acknowledges education  Clinical Observations/Feedback: Patient did not attend group session.     Victorino Sparrow, LRT/CTRS         Victorino Sparrow A 11/26/2020 12:32 PM

## 2020-11-27 DIAGNOSIS — F323 Major depressive disorder, single episode, severe with psychotic features: Secondary | ICD-10-CM | POA: Diagnosis not present

## 2020-11-27 NOTE — Plan of Care (Signed)
  Problem: Self-Concept: Goal: Level of anxiety will decrease Outcome: Progressing

## 2020-11-27 NOTE — Progress Notes (Addendum)
[facility] MD Progress Note  11/27/2020 5:40 PM Casey Silva  MRN:  [id number]  Reason for admission:  Casey Silva is a 65 year old male with a history of major depressive disorder, brief psychotic disorder and insomnia who presented to [facility] as a walk-in on 11/22/2020 and reported worsening symptoms of depression, increased anxiety, increased insomnia and suicidal ideation with thoughts of wrecking his car.   Objective: Medical record reviewed.  Patient's case discussed with nursing staff and members of the treatment team.  I met with and evaluated the patient on the unit today for follow-up.  Patient appears improved today.  He appears anxious, however stated he feels less anxious today.  Patient reported that his mood is better, less depressed.  Today he denies AH/VH, SI/HI, paranoia and delusions. Patient does not appear to be responding to internal stimuli. He talked about attempting to stab himself before he was admitted. He does not elaborate on why he felt suicidal when asked by this provider. He does report some fleeting, passive SI but is able to contract for safety on the unit.  He reports decreased intensity and frequency of suicidal ideation. Patient denies any thoughts of harming himself on the unit.  He continues to experience fleeting thoughts of crashing his car but says that he is able to distract himself from these thoughts by thinking of a better time in his life.  He reports good appetite.  Patient reported he slept better last night, record reflects he slept 6.75 hours.  He denies any physical issues other than chronic pain due to his arthritis which is in his hands bilaterally.  Vital signs this morning include BP of 156/99 sitting and 145/103 standing; pulse of 86 sitting and 93 standing; respirations of 16 and temperature of 97.8.  No new labs or medication changes today.  Principal Problem: Severe major depression with psychotic features ([place]) Diagnosis: Principal Problem:   Severe major  depression with psychotic features ([place]) Active Problems:   GAD (generalized anxiety disorder)  Total Time spent with patient:  25 minutes  Past Psychiatric History: See admission H&P  Past Medical History:  Past Medical History:  Diagnosis Date   Anxiety    Depression    History reviewed. No pertinent surgical history. Family History:  Family History  Problem Relation Age of Onset   Bipolar disorder Mother    Family Psychiatric  History: See admission H&P Social History:  Social History   Substance and Sexual Activity  Alcohol Use No     Social History   Substance and Sexual Activity  Drug Use No    Social History   Socioeconomic History   Marital status: Single    Spouse name: Not on file   Number of children: 1   Years of education: Not on file   Highest education level: GED or equivalent  Occupational History   Not on file  Tobacco Use   Smoking status: Never   Smokeless tobacco: Never  Vaping Use   Vaping Use: Unknown  Substance and Sexual Activity   Alcohol use: No   Drug use: No   Sexual activity: Not Currently  Other Topics Concern   Not on file  Social History Narrative   Marital status:  Single      Children: none      Employment:  Unemployment.  Independently wealthy; previous work for [employer].      Tobacco:  1-2 cigarettes per day since age 67.      Alcohol:  None      Drugs:  none      Exercise:  Regularly very      Religion:  Jewish   Social Determinants of Radio broadcast assistant Strain: Not on file  Food Insecurity: Not on file  Transportation Needs: Not on file  Physical Activity: Not on file  Stress: Not on file  Social Connections: Not on file   Additional Social History:                         Sleep: Fair  Appetite:  Good  Current Medications: Current Facility-Administered Medications  Medication Dose Route Frequency Provider Last Rate Last Admin   acetaminophen (TYLENOL) tablet 650 mg  650  mg Oral Q6H PRN Rankin, Shuvon B, NP   650 mg at 11/26/20 0810   alum & mag hydroxide-simeth (MAALOX/MYLANTA) 200-200-20 MG/5ML suspension 30 mL  30 mL Oral Q4H PRN Rankin, Shuvon B, NP       amLODipine (NORVASC) tablet 5 mg  5 mg Oral Daily Lindon Romp A, NP   5 mg at 11/27/20 0824   hydrOXYzine (ATARAX/VISTARIL) tablet 25 mg  25 mg Oral Q6H PRN Arthor Captain, MD   25 mg at 11/26/20 2101   magnesium hydroxide (MILK OF MAGNESIA) suspension 30 mL  30 mL Oral Daily PRN Rankin, Shuvon B, NP       melatonin tablet 5 mg  5 mg Oral QHS Arthor Captain, MD   5 mg at 11/26/20 2101   mirtazapine (REMERON) tablet 15 mg  15 mg Oral QHS Arthor Captain, MD   15 mg at 11/26/20 2101   polyethylene glycol (MIRALAX / GLYCOLAX) packet 17 g  17 g Oral Daily PRN Lindon Romp A, NP   17 g at 11/24/20 1730   risperiDONE (RISPERDAL) tablet 1.5 mg  1.5 mg Oral QHS Arthor Captain, MD   1.5 mg at 11/26/20 2101   sertraline (ZOLOFT) tablet 50 mg  50 mg Oral Daily Arthor Captain, MD   50 mg at 11/27/20 0824   traZODone (DESYREL) tablet 50 mg  50 mg Oral QHS PRN Arthor Captain, MD   50 mg at 11/24/20 2223    Lab Results:  No results found for this or any previous visit (from the past 73 hour(s)).   Blood Alcohol level:  Lab Results  Component Value Date   ETH <10 11/22/2020   ETH <10 85/05/7739    Metabolic Disorder Labs: Lab Results  Component Value Date   HGBA1C 5.5 11/24/2020   MPG 111.15 11/24/2020   No results found for: PROLACTIN Lab Results  Component Value Date   CHOL 237 (H) 11/24/2020   TRIG 192 (H) 11/24/2020   HDL 51 11/24/2020   CHOLHDL 4.6 11/24/2020   VLDL 38 11/24/2020   LDLCALC 148 (H) 11/24/2020    Physical Findings: AIMS: Facial and Oral Movements Muscles of Facial Expression: None, normal Lips and Perioral Area: None, normal Jaw: None, normal Tongue: None, normal,Extremity Movements Upper (arms, wrists, hands, fingers): None, normal Lower (legs, knees, ankles, toes):  None, normal, Trunk Movements Neck, shoulders, hips: None, normal, Overall Severity Severity of abnormal movements (highest score from questions above): None, normal Incapacitation due to abnormal movements: None, normal Patient's awareness of abnormal movements (rate only patient's report): No Awareness, Dental Status Current problems with teeth and/or dentures?: Yes Does patient usually wear dentures?: No  CIWA:    COWS:  Musculoskeletal: Strength & Muscle Tone: within normal limits Gait & Station: normal Patient leans: N/A  Psychiatric Specialty Exam:  Presentation  General Appearance: Appropriate for Environment; Fairly Groomed  Eye Contact:Good  Speech:Clear and Coherent  Speech Volume:Normal  Handedness:Right   Mood and Affect  Mood:Anxious; Depressed ("A little better")  Affect:Congruent   Thought Process  Thought Processes:Coherent; Goal Directed  Descriptions of Associations:Intact  Orientation:Full (Time, Place and Person)  Thought Content:Rumination  History of Schizophrenia/Schizoaffective disorder:No  Duration of Psychotic Symptoms:Greater than six months  Hallucinations:Hallucinations: Auditory  Ideas of Reference:None  Suicidal Thoughts:Suicidal Thoughts: Yes, Passive SI Passive Intent and/or Plan: Without Intent; Without Plan  Homicidal Thoughts:Homicidal Thoughts: No   Sensorium  Memory:Immediate Good; Recent Good  Judgment:Fair  Insight:Fair   Executive Functions  Concentration:Good  Attention Span:Good  Recall:Good  Fund of Knowledge:Good  Language:Good   Psychomotor Activity  Psychomotor Activity:Psychomotor Activity: Normal   Assets  Assets:Communication Skills; Desire for Improvement; Housing; Vocational/Educational; Transportation   Sleep  Sleep:Sleep: Good Number of Hours of Sleep: 6.75    Physical Exam: Physical Exam Vitals and nursing note reviewed.  Constitutional:      General: He is not in  acute distress.    Appearance: Normal appearance. He is not diaphoretic.  HENT:     Head: Normocephalic and atraumatic.  Cardiovascular:     Rate and Rhythm: Normal rate.  Pulmonary:     Effort: Pulmonary effort is normal.  Neurological:     General: No focal deficit present.     Mental Status: He is alert and oriented to person, place, and time.   Review of Systems  Constitutional:  Negative for chills, diaphoresis and fever.  HENT:  Negative for sore throat.   Respiratory:  Negative for cough and shortness of breath.   Cardiovascular:  Negative for chest pain and palpitations.  Gastrointestinal:  Negative for abdominal pain, constipation, diarrhea, nausea and vomiting.  Genitourinary:  Negative for dysuria.  Musculoskeletal:  Positive for joint pain. Negative for myalgias.       Positive for chronic arthritic pain in hands bilaterally  Skin:  Negative for rash.  Neurological:  Negative for tremors and seizures.  Psychiatric/Behavioral:  Positive for depression, hallucinations and suicidal ideas. The patient is nervous/anxious and has insomnia.    Blood pressure (!) 145/103, pulse 93, temperature 97.8 F (36.6 C), temperature source Oral, resp. rate 16, height 5' 8"  (1.727 m), weight 79.8 kg, SpO2 100 %. Body mass index is 26.76 kg/m.   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  Patient is improving on current medications.  He continues with anxiety, worry, depressed mood and suicidal ideation.  He reports feeling less depressed, decreased frequency of auditory hallucinations and experiencing less frequent SI.  He denies thoughts of harming himself in the hospital.  Sleep has improved per staff report although patient subjectively reports continued difficulty with early and middle insomnia.    Daily contact with patient to assess and evaluate symptoms and progress in  treatment and Medication management  Major depressive disorder, recurrent -Increase Zoloft to 50 mg daily for depression and anxiety.  -Continue mirtazapine 15 mg at bedtime for mood and sleep -Increase Risperdal to 1.5 mg at bedtime for mood and hallucinations  Anxiety -Increase Zoloft to 50 mg daily for depression and anxiety.  -Continue hydroxyzine 25 mg Q6H PRN anxiety  Insomnia -Continue melatonin 5 mg nightly -Continue trazodone 50 mg nightly PRN  Hypertension -Continue Norvasc 5 mg daily  Discharge planning in progress  Ethelene Hal, NP 11/27/2020, 5:40 PM

## 2020-11-27 NOTE — [facility] Group Notes (Signed)
[facility] Group Notes:  (Nursing/MHT/Case Management/Adjunct)  Date:  11/26/2020 Time:  8:00 PM  Type of Therapy:  Group Therapy  Participation Level:  Did Not Attend  Participation Quality:  NA  Affect:  NA  Cognitive:  NA  Insight:  None  Engagement in Group:  NA  Modes of Intervention:  NA  Summary of Progress/Problems:  Maxine Glenn 11/26/2020, 8:00 PM

## 2020-11-27 NOTE — Progress Notes (Signed)
[facility] Notes:  (Nursing/MHT/Case Management/Adjunct)  Date:  11/27/2020  Time:  2000  Type of Therapy:   wrap up group  Participation Level:  Active  Participation Quality:  Appropriate, Attentive, Sharing, and Supportive  Affect:  Flat  Cognitive:  Alert  Insight:  Improving  Engagement in Group:  Engaged  Modes of Intervention:  Clarification, Education, and Support  Summary of Progress/Problems: Positive thinking and positive change were discussed.  Shellia Cleverly 11/27/2020, 9:23 PM

## 2020-11-27 NOTE — Progress Notes (Signed)
   11/27/20 0915  Charting Type  Charting Type Shift assessment  Assessment of needs addressed Yes  Orders Chart Check (once per shift) Completed  Safety Check Verification  Has the RN verified the 15 minute safety check completion? Yes  Neurological  Neuro (WDL) WDL  Neuro Symptoms Anxiety;Depression  HEENT  HEENT (WDL) WDL  R Eye Impaired vision  L Eye Impaired vision  Respiratory  Respiratory (WDL) WDL  Cardiac  Cardiac (WDL) X (HTN)  Vascular  Vascular (WDL) X  Integumentary  Integumentary (WDL) WDL  Braden Scale (Ages 8 and up)  Sensory Perceptions 4  Moisture 4  Activity 4  Mobility 4  Nutrition 3  Friction and Shear 3  Braden Scale Score 22  Musculoskeletal  Musculoskeletal (WDL) WDL  Gastrointestinal  Gastrointestinal (WDL) WDL  GU Assessment  Genitourinary (WDL) WDL

## 2020-11-27 NOTE — Progress Notes (Addendum)
   11/26/20 2000  Psych Admission Type (Psych Patients Only)  Admission Status Voluntary  Psychosocial Assessment  Patient Complaints Anxiety;Depression  Eye Contact Fair  Facial Expression Anxious;Worried  Affect Sullen;Sad;Apprehensive  Speech Logical/coherent  Interaction Forwards little;Minimal;Guarded  Motor Activity Slow  Appearance/Hygiene Unremarkable  Behavior Characteristics Cooperative;Anxious  Mood Depressed;Anxious  Thought Process  Coherency Concrete thinking  Content Blaming self  Delusions None reported or observed  Perception WDL  Hallucination Auditory  Judgment Poor  Confusion None  Danger to Self  Current suicidal ideation? Passive  Self-Injurious Behavior No self-injurious ideation or behavior indicators observed or expressed   Agreement Not to Harm Self Yes  Description of Agreement verbal agreement to approach staff  Danger to Others  Danger to Others None reported or observed   Pt seen in his room. Pt has depressed affect. Pt endorses passive SI but contracts for safety. Says thoughts come and go throughout the day. Says provider is increasing his meds. Still with a negative outlook. Pt rates anxiety 7/10 and depression 8/10. Blaming himself. "I did something 23 years ago and it is still affecting me." Pt doesn't elaborate on situation. Pt does say that things changed about a year ago. "I always knew I did something all those years ago but it didn't bother me until a year ago when I had a psychotic break."

## 2020-11-27 NOTE — Progress Notes (Signed)
Pt did not attend group. 

## 2020-11-28 DIAGNOSIS — F323 Major depressive disorder, single episode, severe with psychotic features: Secondary | ICD-10-CM | POA: Diagnosis not present

## 2020-11-28 NOTE — Plan of Care (Signed)
  Problem: Education: Goal: Knowledge of the prescribed therapeutic regimen will improve Outcome: Progressing   Problem: Activity: Goal: Interest or engagement in leisure activities will improve Outcome: Progressing   Problem: Self-Concept: Goal: Level of anxiety will decrease Outcome: Not Progressing

## 2020-11-28 NOTE — Progress Notes (Signed)
Nursing Note  Patient stated that " I am feeling bad today because I think I will get discharged tomorrow and I do not want to go home I am not fit mentally and physically I need to be institutionalized." Patient endorsing Passive SI and verbally contracted for safety stating that he  feels safe here but once he goes home he cannot promise. " I have a large knife at home I can use or I read that if you drink antifreeze you can die slowly I cannot stay by my self".  Denies HI/A/VH and verbally contracted for safety.  Patient  did not attend scheduled programming. Rated depression 9/10 and anxiety 8/10 stating it got worse because today is day 6 of his admission and "just thinking of discharging is making me more depressed and anxious"    Support and encouragement provided. Q 15 minutes safety checks ongoing Patient remains safe.

## 2020-11-28 NOTE — Progress Notes (Signed)
Pt didn't attend group. 

## 2020-11-28 NOTE — Progress Notes (Signed)
     11/27/20 2115  Psych Admission Type (Psych Patients Only)  Admission Status Voluntary  Psychosocial Assessment  Patient Complaints Anxiety;Depression  Eye Contact Fair  Facial Expression Anxious;Worried  Affect Sullen;Sad;Apprehensive  Speech Logical/coherent  Interaction Forwards little;Minimal;Guarded  Motor Activity Slow  Appearance/Hygiene Unremarkable  Behavior Characteristics Cooperative;Anxious  Mood Depressed;Anxious  Thought Process  Coherency Concrete thinking  Content Blaming self  Delusions None reported or observed  Perception WDL  Hallucination Auditory  Judgment Poor  Confusion None  Danger to Self  Current suicidal ideation? Passive  Self-Injurious Behavior No self-injurious ideation or behavior indicators observed or expressed   Agreement Not to Harm Self Yes  Description of Agreement verbal agreement to approach staff  Danger to Others  Danger to Others None reported or observed

## 2020-11-28 NOTE — Progress Notes (Signed)
[facility] MD Progress Note  11/28/2020 12:43 PM Casey Silva  MRN:  [id number]  Reason for admission:  Casey Silva is a 65 year old male with a history of major depressive disorder, brief psychotic disorder and insomnia who presented to [facility] as a walk-in on 11/22/2020 and reported worsening symptoms of depression, increased anxiety, increased insomnia and suicidal ideation with thoughts of wrecking his car.   Objective: Patient was seen and evaluated, chart reviewed and case discussed with the treatment team. Patient stated he is feeling no better than when he came in and does not think his medications are working. He stated "my situation" is why I feel this way. Patient stated he has chronic pain, arthritis in his hands and his dentures are broken. He stated my mental health won't let me function. He rated his anxiety and depression as 8-9/10, 10 being the worst. He stated everything makes him anxious. He endorses suicidal ideation and stated he has researched ways to die that aren't bloody. He stated I could drink antifreeze and die slowly. Today he denies AH/VH, HI, paranoia and delusions. Patient does not appear to be responding to internal stimuli. He is able to contract for safety on the unit. He stated he could not keep himself safe if he was discharged. He reports a good appetite.  Patient reported he slept better last night, record reflects he slept 6.25 hours.  He stated he has been living in a boarding house but wants to live in a group home. Vital signs this morning include BP of 161/99 sitting and pulse of 97 sitting . No new labs or medication changes today.  Principal Problem: Severe major depression with psychotic features ([place]) Diagnosis: Principal Problem:   Severe major depression with psychotic features ([place]) Active Problems:   GAD (generalized anxiety disorder)  Total Time spent with patient:  25 minutes  Past Psychiatric History: See admission H&P  Past Medical History:  Past  Medical History:  Diagnosis Date   Anxiety    Depression    History reviewed. No pertinent surgical history. Family History:  Family History  Problem Relation Age of Onset   Bipolar disorder Mother    Family Psychiatric  History: See admission H&P Social History:  Social History   Substance and Sexual Activity  Alcohol Use No     Social History   Substance and Sexual Activity  Drug Use No    Social History   Socioeconomic History   Marital status: Single    Spouse name: Not on file   Number of children: 1   Years of education: Not on file   Highest education level: GED or equivalent  Occupational History   Not on file  Tobacco Use   Smoking status: Never   Smokeless tobacco: Never  Vaping Use   Vaping Use: Unknown  Substance and Sexual Activity   Alcohol use: No   Drug use: No   Sexual activity: Not Currently  Other Topics Concern   Not on file  Social History Narrative   Marital status:  Single      Children: none      Employment:  Unemployment.  Independently wealthy; previous work for [employer].      Tobacco:  1-2 cigarettes per day since age 19.      Alcohol:  None      Drugs:  none      Exercise:  Regularly very      Religion:  Jewish   Social Determinants of Health  Financial Resource Strain: Not on file  Food Insecurity: Not on file  Transportation Needs: Not on file  Physical Activity: Not on file  Stress: Not on file  Social Connections: Not on file   Additional Social History:                         Sleep: Fair  Appetite:  Good  Current Medications: Current Facility-Administered Medications  Medication Dose Route Frequency Provider Last Rate Last Admin   acetaminophen (TYLENOL) tablet 650 mg  650 mg Oral Q6H PRN Rankin, Shuvon B, NP   650 mg at 11/26/20 0810   alum & mag hydroxide-simeth (MAALOX/MYLANTA) 200-200-20 MG/5ML suspension 30 mL  30 mL Oral Q4H PRN Rankin, Shuvon B, NP       amLODipine (NORVASC) tablet 5  mg  5 mg Oral Daily Lindon Romp A, NP   5 mg at 11/28/20 0825   hydrOXYzine (ATARAX/VISTARIL) tablet 25 mg  25 mg Oral Q6H PRN Arthor Captain, MD   25 mg at 11/28/20 1143   magnesium hydroxide (MILK OF MAGNESIA) suspension 30 mL  30 mL Oral Daily PRN Rankin, Shuvon B, NP       melatonin tablet 5 mg  5 mg Oral QHS Arthor Captain, MD   5 mg at 11/27/20 2113   mirtazapine (REMERON) tablet 15 mg  15 mg Oral QHS Arthor Captain, MD   15 mg at 11/27/20 2114   polyethylene glycol (MIRALAX / GLYCOLAX) packet 17 g  17 g Oral Daily PRN Lindon Romp A, NP   17 g at 11/24/20 1730   risperiDONE (RISPERDAL) tablet 1.5 mg  1.5 mg Oral QHS Arthor Captain, MD   1.5 mg at 11/27/20 2112   sertraline (ZOLOFT) tablet 50 mg  50 mg Oral Daily Arthor Captain, MD   50 mg at 11/28/20 0825   traZODone (DESYREL) tablet 50 mg  50 mg Oral QHS PRN Arthor Captain, MD   50 mg at 11/24/20 2223    Lab Results:  No results found for this or any previous visit (from the past 35 hour(s)).   Blood Alcohol level:  Lab Results  Component Value Date   ETH <10 11/22/2020   ETH <10 123XX123    Metabolic Disorder Labs: Lab Results  Component Value Date   HGBA1C 5.5 11/24/2020   MPG 111.15 11/24/2020   No results found for: PROLACTIN Lab Results  Component Value Date   CHOL 237 (H) 11/24/2020   TRIG 192 (H) 11/24/2020   HDL 51 11/24/2020   CHOLHDL 4.6 11/24/2020   VLDL 38 11/24/2020   LDLCALC 148 (H) 11/24/2020    Physical Findings: AIMS: Facial and Oral Movements Muscles of Facial Expression: None, normal Lips and Perioral Area: None, normal Jaw: None, normal Tongue: None, normal,Extremity Movements Upper (arms, wrists, hands, fingers): None, normal Lower (legs, knees, ankles, toes): None, normal, Trunk Movements Neck, shoulders, hips: None, normal, Overall Severity Severity of abnormal movements (highest score from questions above): None, normal Incapacitation due to abnormal movements: None,  normal Patient's awareness of abnormal movements (rate only patient's report): No Awareness, Dental Status Current problems with teeth and/or dentures?: Yes Does patient usually wear dentures?: No  CIWA:    COWS:     Musculoskeletal: Strength & Muscle Tone: within normal limits Gait & Station: normal Patient leans: N/A  Psychiatric Specialty Exam:  Presentation  General Appearance: Appropriate for Environment; Fairly Groomed  Eye Contact:Good  Speech:Clear and Coherent  Speech Volume:Normal  Handedness:Right   Mood and Affect  Mood:Anxious; Depressed ("A little better")  Affect:Congruent   Thought Process  Thought Processes:Coherent; Goal Directed  Descriptions of Associations:Intact  Orientation:Full (Time, Place and Person)  Thought Content:Rumination  History of Schizophrenia/Schizoaffective disorder:No  Duration of Psychotic Symptoms:Greater than six months  Hallucinations:No data recorded  Ideas of Reference:None  Suicidal Thoughts:No data recorded  Homicidal Thoughts:No data recorded   Sensorium  Memory:Immediate Good; Recent Good  Judgment:Fair  Insight:Fair   Executive Functions  Concentration:Good  Attention Span:Good  [place] of Knowledge:Good  Language:Good   Psychomotor Activity  Psychomotor Activity:No data recorded   Assets  Assets:Communication Skills; Desire for Improvement; Housing; Vocational/Educational; Transportation   Sleep  Sleep:No data recorded    Physical Exam: Physical Exam Vitals and nursing note reviewed.  Constitutional:      General: He is not in acute distress.    Appearance: Normal appearance. He is not diaphoretic.  HENT:     Head: Normocephalic and atraumatic.  Cardiovascular:     Rate and Rhythm: Normal rate.  Pulmonary:     Effort: Pulmonary effort is normal.  Neurological:     General: No focal deficit present.     Mental Status: He is alert and oriented to person,  place, and time.   Review of Systems  Constitutional:  Negative for chills, diaphoresis and fever.  HENT:  Negative for sore throat.   Respiratory:  Negative for cough and shortness of breath.   Cardiovascular:  Negative for chest pain and palpitations.  Gastrointestinal:  Negative for abdominal pain, constipation, diarrhea, nausea and vomiting.  Genitourinary:  Negative for dysuria.  Musculoskeletal:  Positive for joint pain. Negative for myalgias.       Positive for chronic arthritic pain in hands bilaterally  Skin:  Negative for rash.  Neurological:  Negative for tremors and seizures.  Psychiatric/Behavioral:  Positive for depression, hallucinations and suicidal ideas. The patient is nervous/anxious and has insomnia.    Blood pressure (!) 161/99, pulse 97, temperature 97.8 F (36.6 C), temperature source Oral, resp. rate 16, height '5\' 8"'$  (1.727 m), weight 79.8 kg, SpO2 97 %. Body mass index is 26.76 kg/m.   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  Patient is improving on current medications.  He continues with anxiety, worry, depressed mood and suicidal ideation.  He reports feeling less depressed, decreased frequency of auditory hallucinations and experiencing less frequent SI.  He denies thoughts of harming himself in the hospital.  Sleep has improved per staff report although patient subjectively reports continued difficulty with early and middle insomnia.    Daily contact with patient to assess and evaluate symptoms and progress in treatment and Medication management  Major depressive disorder, recurrent -Continue Zoloft to 50 mg daily for depression and anxiety.  -Continue mirtazapine 15 mg at bedtime for mood and sleep -Continue Risperdal to 1.5 mg at bedtime for mood and hallucinations  Anxiety -Continue Zoloft to 50 mg daily for depression and anxiety.  -Continue  hydroxyzine 25 mg Q6H PRN anxiety  Insomnia -Continue melatonin 5 mg nightly -Continue trazodone 50 mg nightly PRN  Hypertension -Continue Norvasc 5 mg daily  Continue every 15 minute safety checks Encourage participation in the therapeutic milieu Discharge planning in progress  Ethelene Hal, NP 11/28/2020, 4:06 PM

## 2020-11-28 NOTE — Progress Notes (Signed)
Psychoeducational Group Note  Date:  11/28/2020 Time:  2000  Group Topic/Focus:  Wrap up group  Participation Level: Did Not Attend  Participation Quality:  Not Applicable  Affect:  Not Applicable  Cognitive:  Not Applicable  Insight:  Not Applicable  Engagement in Group: Not Applicable  Additional Comments:  Did not attend.   Shellia Cleverly 11/28/2020, 11:37 PM

## 2020-11-29 ENCOUNTER — Ambulatory Visit (HOSPITAL_COMMUNITY): Payer: No Payment, Other | Admitting: Licensed Clinical Social Worker

## 2020-11-29 DIAGNOSIS — F323 Major depressive disorder, single episode, severe with psychotic features: Secondary | ICD-10-CM | POA: Diagnosis not present

## 2020-11-29 MED ORDER — TRAZODONE HCL 100 MG PO TABS
100.0000 mg | ORAL_TABLET | Freq: Every day | ORAL | Status: DC
  Administered 2020-11-29 – 2020-12-01 (×3): 100 mg via ORAL
  Filled 2020-11-29 (×3): qty 1
  Filled 2020-11-29: qty 7

## 2020-11-29 NOTE — Progress Notes (Signed)
Psychoeducational Group Note  Date:  11/29/2020 Time:  [id number]  Group Topic/Focus:  Goals Group:   The focus of this group is to help patients establish daily goals to achieve during treatment and discuss how the patient can incorporate goal setting into their daily lives to aide in recovery.  Participation Level: Did Not Attend  Participation Quality:  Not Applicable  Affect:  Not Applicable  Cognitive:  Not Applicable  Insight:  Not Applicable  Engagement in Group: Not Applicable  Additional Comments:  Pt did not attend group this morning.  Adelaida Reindel E 11/29/2020, 6:39 PM

## 2020-11-29 NOTE — [facility] Group Notes (Signed)
Type of Therapy and Topic:  Group Therapy: Anger and Coping Skills   Participation Level:  Active     Description of Group:   In this group, patients learned how to recognize the physical, cognitive, emotional, and behavioral responses they have to anger-provoking situations.  They identified how they usually or often react when angered, and learned how healthy and unhealthy coping skills work initially, but the unhealthy ones stop working.   They analyzed how their frequently-chosen coping skill is possibly beneficial and how it is possibly unhelpful.  The group discussed a variety of healthier coping skills that could help in resolving the actual issues, as well as how to go about planning for the the possibility of future similar situations.   Therapeutic Goals: Patients will identify one thing that makes them angry and how they feel emotionally and physically, what their thoughts are or tend to be in those situations, and what healthy or unhealthy coping mechanism they typically use Patients will identify how their coping technique works for them, as well as how it works against them. Patients will explore possible new behaviors to use in future anger situations. Patients will learn that anger itself is normal and cannot be eliminated, and that healthier coping skills can assist with resolving conflict rather than worsening situations.   Summary of Patient Progress:  Patient participated and engaged appropriately in group.  Patient participated in ice breaker.  Patient shared their experience with anger and how they coped with it in the past.  Patient engaged with different coping methods that they could utilize in the future when they are angry.

## 2020-11-29 NOTE — Progress Notes (Signed)
   11/28/20 2103  Psych Admission Type (Psych Patients Only)  Admission Status Voluntary  Psychosocial Assessment  Patient Complaints Anxiety;Depression  Eye Contact Fair  Facial Expression Anxious;Worried  Affect Sullen;Sad;Apprehensive  Speech Logical/coherent  Interaction Forwards little;Minimal;Guarded  Motor Activity Slow  Appearance/Hygiene Unremarkable  Behavior Characteristics Cooperative;Anxious  Mood Depressed;Sad  Thought Pension scheme manager thinking  Content Blaming self  Delusions None reported or observed  Perception WDL  Hallucination Auditory  Judgment Poor  Confusion None  Danger to Self  Current suicidal ideation? Passive  Self-Injurious Behavior No self-injurious ideation or behavior indicators observed or expressed   Agreement Not to Harm Self Yes  Description of Agreement Verbal  contract  Danger to Others  Danger to Others None reported or observed

## 2020-11-29 NOTE — [facility] Group Notes (Signed)
Pt attended AA meeting this evening.  

## 2020-11-29 NOTE — Tx Team (Signed)
Interdisciplinary Treatment and Diagnostic Plan Update  11/29/2020 Time of Session: 9:15am  Casey Silva MRN: [id number]  Principal Diagnosis: Severe major depression with psychotic features [facility])  Secondary Diagnoses: Principal Problem:   Severe major depression with psychotic features ([place]) Active Problems:   GAD (generalized anxiety disorder)   Current Medications:  Current Facility-Administered Medications  Medication Dose Route Frequency Provider Last Rate Last Admin   acetaminophen (TYLENOL) tablet 650 mg  650 mg Oral Q6H PRN Rankin, Shuvon B, NP   650 mg at 11/26/20 0810   alum & mag hydroxide-simeth (MAALOX/MYLANTA) 200-200-20 MG/5ML suspension 30 mL  30 mL Oral Q4H PRN Rankin, Shuvon B, NP       amLODipine (NORVASC) tablet 5 mg  5 mg Oral Daily Lindon Romp A, NP   5 mg at 11/29/20 [id number]   hydrOXYzine (ATARAX/VISTARIL) tablet 25 mg  25 mg Oral Q6H PRN Arthor Captain, MD   25 mg at 11/28/20 2103   magnesium hydroxide (MILK OF MAGNESIA) suspension 30 mL  30 mL Oral Daily PRN Rankin, Shuvon B, NP       melatonin tablet 5 mg  5 mg Oral QHS Arthor Captain, MD   5 mg at 11/28/20 2103   mirtazapine (REMERON) tablet 15 mg  15 mg Oral QHS Arthor Captain, MD   15 mg at 11/28/20 2103   polyethylene glycol (MIRALAX / GLYCOLAX) packet 17 g  17 g Oral Daily PRN Lindon Romp A, NP   17 g at 11/24/20 1730   risperiDONE (RISPERDAL) tablet 1.5 mg  1.5 mg Oral QHS Arthor Captain, MD   1.5 mg at 11/28/20 2103   sertraline (ZOLOFT) tablet 50 mg  50 mg Oral Daily Arthor Captain, MD   50 mg at 11/29/20 [id number]   traZODone (DESYREL) tablet 100 mg  100 mg Oral QHS Arthor Captain, MD       traZODone (DESYREL) tablet 50 mg  50 mg Oral QHS PRN Arthor Captain, MD   50 mg at 11/28/20 2103   PTA Medications: Medications Prior to Admission  Medication Sig Dispense Refill Last Dose   aspirin EC 81 MG EC tablet Take 1 tablet (81 mg total) by mouth daily. (May buy from over the counter,Swallow whole): For  heart health. (Patient not taking: No sig reported) 30 tablet 0    Brexpiprazole 3 MG TABS TAKE 1 TABLET (3 MG TOTAL) BY MOUTH DAILY. 30 tablet 2    hydrOXYzine (ATARAX/VISTARIL) 50 MG tablet TAKE 1 TABLET (50 MG TOTAL) BY MOUTH 3 (THREE) TIMES DAILY AS NEEDED FOR ITCHING OR ANXIETY. 90 tablet 2    lidocaine (XYLOCAINE) 2 % solution Use as directed 15 mLs in the mouth or throat as needed for mouth pain. (Patient not taking: No sig reported) 100 mL 0    lisinopril (ZESTRIL) 10 MG tablet Take 1 tablet (10 mg total) by mouth daily. For high blood pressure (Patient not taking: No sig reported) 30 tablet 0    lisinopril (ZESTRIL) 10 MG tablet Take 1 tablet by mouth daily. 30 tablet 1    losartan (COZAAR) 25 MG tablet Take 1 tablet by mouth daily. (Patient not taking: Reported on 11/23/2020) 30 tablet 1    Melatonin 10 MG TABS Take 10 mg by mouth at bedtime as needed.      mirtazapine (REMERON) 30 MG tablet TAKE 1 TABLET BY MOUTH ONCE DAILY AT BEDTIME (Patient not taking: Reported on 11/23/2020) 30 tablet 0    mirtazapine (  REMERON) 45 MG tablet Take 1 tablet (45 mg total) by mouth at bedtime. 30 tablet 2    traZODone (DESYREL) 100 MG tablet TAKE 1 TABLET BY MOUTH ONCE DAILY AT BEDTIME AS NEEDED FOR SLEEP (Patient taking differently: Take 100 mg by mouth at bedtime as needed for sleep. TAKE 1 TABLET BY MOUTH ONCE DAILY AT BEDTIME AS NEEDED FOR SLEEP) 30 tablet 0     Patient Stressors: Financial difficulties Health problems  Patient Strengths: Ability for insight Average or above average intelligence Motivation for treatment/growth Work skills  Treatment Modalities: Medication Management, Group therapy, Case management,  1 to 1 session with clinician, Psychoeducation, Recreational therapy.   Physician Treatment Plan for Primary Diagnosis: Severe major depression with psychotic features ([place]) Long Term Goal(s): Improvement in symptoms so as ready for discharge   Short Term Goals: Ability to identify  changes in lifestyle to reduce recurrence of condition will improve Ability to verbalize feelings will improve Ability to disclose and discuss suicidal ideas Ability to demonstrate self-control will improve Ability to identify and develop effective coping behaviors will improve Ability to maintain clinical measurements within normal limits will improve Compliance with prescribed medications will improve Ability to identify triggers associated with substance abuse/mental health issues will improve  Medication Management: Evaluate patient's response, side effects, and tolerance of medication regimen.  Therapeutic Interventions: 1 to 1 sessions, Unit Group sessions and Medication administration.  Evaluation of Outcomes: Progressing  Physician Treatment Plan for Secondary Diagnosis: Principal Problem:   Severe major depression with psychotic features ([place]) Active Problems:   GAD (generalized anxiety disorder)  Long Term Goal(s): Improvement in symptoms so as ready for discharge   Short Term Goals: Ability to identify changes in lifestyle to reduce recurrence of condition will improve Ability to verbalize feelings will improve Ability to disclose and discuss suicidal ideas Ability to demonstrate self-control will improve Ability to identify and develop effective coping behaviors will improve Ability to maintain clinical measurements within normal limits will improve Compliance with prescribed medications will improve Ability to identify triggers associated with substance abuse/mental health issues will improve     Medication Management: Evaluate patient's response, side effects, and tolerance of medication regimen.  Therapeutic Interventions: 1 to 1 sessions, Unit Group sessions and Medication administration.  Evaluation of Outcomes: Progressing   RN Treatment Plan for Primary Diagnosis: Severe major depression with psychotic features ([place]) Long Term Goal(s): Knowledge of disease  and therapeutic regimen to maintain health will improve  Short Term Goals: Ability to remain free from injury will improve, Ability to participate in decision making will improve, Ability to verbalize feelings will improve, Ability to disclose and discuss suicidal ideas, and Ability to identify and develop effective coping behaviors will improve  Medication Management: RN will administer medications as ordered by provider, will assess and evaluate patient's response and provide education to patient for prescribed medication. RN will report any adverse and/or side effects to prescribing provider.  Therapeutic Interventions: 1 on 1 counseling sessions, Psychoeducation, Medication administration, Evaluate responses to treatment, Monitor vital signs and CBGs as ordered, Perform/monitor CIWA, COWS, AIMS and Fall Risk screenings as ordered, Perform wound care treatments as ordered.  Evaluation of Outcomes: Progressing   LCSW Treatment Plan for Primary Diagnosis: Severe major depression with psychotic features ([place]) Long Term Goal(s): Safe transition to appropriate next level of care at discharge, Engage patient in therapeutic group addressing interpersonal concerns.  Short Term Goals: Engage patient in aftercare planning with referrals and resources, Increase social support, Increase emotional  regulation, Facilitate acceptance of mental health diagnosis and concerns, Identify triggers associated with mental health/substance abuse issues, and Increase skills for wellness and recovery  Therapeutic Interventions: Assess for all discharge needs, 1 to 1 time with Social worker, Explore available resources and support systems, Assess for adequacy in community support network, Educate family and significant other(s) on suicide prevention, Complete Psychosocial Assessment, Interpersonal group therapy.  Evaluation of Outcomes: Progressing   Progress in Treatment: Attending groups: No. Participating in  groups: No. Taking medication as prescribed: Yes. Toleration medication: Yes. Family/Significant other contact made: Yes, individual(s) contacted:  [place] Patient understands diagnosis: Yes. and No. Discussing patient identified problems/goals with staff: Yes. Medical problems stabilized or resolved: Yes. Denies suicidal/homicidal ideation: Yes. Issues/concerns per patient self-inventory: No.   New problem(s) identified: No, Describe:  None   New Short Term/Long Term Goal(s): medication stabilization, elimination of SI thoughts, development of comprehensive mental wellness plan.   Patient Goals: "To go to a group home and to get stable"   Discharge Plan or Barriers: Patient is to follow up with [facility] for further treatment post discharge.  Reason for Continuation of Hospitalization: Anxiety Depression Medication stabilization  Estimated Length of Stay: 3 to 5 days   Attendees: Patient:  11/24/2020   Physician:  11/24/2020   Nursing:  11/24/2020   RN Care Manager: 11/24/2020   Social Worker: Darletta Moll, LCSW 11/24/2020   Recreational Therapist:  11/24/2020   Other:  11/24/2020   Other:  11/24/2020   Other: 11/24/2020     Scribe for Treatment Team: Vassie Moselle, [place] 11/29/2020 2:04 PM

## 2020-11-29 NOTE — Progress Notes (Signed)
[facility] MD Progress Note  11/29/2020 3:12 PM Casey Silva  MRN:  [id number]  Reason for admission:  Casey Silva is a 65 year old male with a history of major depressive disorder, brief psychotic disorder and insomnia who presented to [facility] as a walk-in on 11/22/2020 and reported worsening symptoms of depression, increased anxiety, increased insomnia and suicidal ideation with thoughts of wrecking his car.   Objective: Medical record reviewed.  Patient's case discussed with nursing staff and members of the treatment team.  I met with and evaluated the patient on the unit today for follow-up.  Patient appears the same today as last week.  He maintains good eye contact.  Patient reports depressed mood and anxiety which he states are unchanged from admission.  He states that he is depressed because "I don't know what I am going to do from here."  When asked to elaborate, patient states he does not want to return to his prior living situation, wants group home placement and does not feel he can work.  We discussed that group home placement directly from the hospital is not possible.  Patient reports problems falling and staying asleep and requests change in his sleep medication.  He states that he has been spending much of the day in bed and has not been going to groups.  I encouraged patient to not sleep during the day and to attend groups.  We discussed that medications are only 1 part of his treatment and that participation in groups, participation in therapy, making modifications in his behavior and other interventions are also important to obtaining relief from symptoms of depression and anxiety.  Patient stated partial understanding.  He reports ongoing chronic suicidal ideation and says he has had thoughts of getting a revolver after discharge.  He denies thoughts of harming himself in the hospital eyes a eye or HI.  Patient endorses low energy.  He reports experiencing intermittent AH of "crowd noise" and VH of  "cobwebs in the corners" but denies other hallucinations.  Patient denies constipation, nausea, vomiting, chest pain, trouble breathing, trouble swallowing, trouble speaking, dizziness, muscle stiffness or tremor.  Staff record the patient slept 6 hours last night.  Vital signs this morning include BP of 139/88 sitting and 127/96 standing; pulse of 92 sitting and 99 standing; O2 sat of 95% and temperature of 98.6.  No new labs.  Staff report the patient has not been attending groups and has spent much of his time in his room.  Principal Problem: Severe major depression with psychotic features ([place]) Diagnosis: Principal Problem:   Severe major depression with psychotic features ([place]) Active Problems:   GAD (generalized anxiety disorder)  Total Time spent with patient:  25 minutes  Past Psychiatric History: See admission H&P  Past Medical History:  Past Medical History:  Diagnosis Date   Anxiety    Depression    History reviewed. No pertinent surgical history. Family History:  Family History  Problem Relation Age of Onset   Bipolar disorder Mother    Family Psychiatric  History: See admission H&P Social History:  Social History   Substance and Sexual Activity  Alcohol Use No     Social History   Substance and Sexual Activity  Drug Use No    Social History   Socioeconomic History   Marital status: Single    Spouse name: Not on file   Number of children: 1   Years of education: Not on file   Highest education level: GED or equivalent  Occupational History   Not on file  Tobacco Use   Smoking status: Never   Smokeless tobacco: Never  Vaping Use   Vaping Use: Unknown  Substance and Sexual Activity   Alcohol use: No   Drug use: No   Sexual activity: Not Currently  Other Topics Concern   Not on file  Social History Narrative   Marital status:  Single      Children: none      Employment:  Unemployment.  Independently wealthy; previous work for [employer].       Tobacco:  1-2 cigarettes per day since age 24.      Alcohol:  None      Drugs:  none      Exercise:  Regularly very      Religion:  Jewish   Social Determinants of Radio broadcast assistant Strain: Not on file  Food Insecurity: Not on file  Transportation Needs: Not on file  Physical Activity: Not on file  Stress: Not on file  Social Connections: Not on file   Additional Social History:                         Sleep: Fair  Appetite:  Good  Current Medications: Current Facility-Administered Medications  Medication Dose Route Frequency Provider Last Rate Last Admin   acetaminophen (TYLENOL) tablet 650 mg  650 mg Oral Q6H PRN Rankin, Shuvon B, NP   650 mg at 11/26/20 0810   alum & mag hydroxide-simeth (MAALOX/MYLANTA) 200-200-20 MG/5ML suspension 30 mL  30 mL Oral Q4H PRN Rankin, Shuvon B, NP       amLODipine (NORVASC) tablet 5 mg  5 mg Oral Daily Lindon Romp A, NP   5 mg at 11/29/20 2458   hydrOXYzine (ATARAX/VISTARIL) tablet 25 mg  25 mg Oral Q6H PRN Arthor Captain, MD   25 mg at 11/28/20 2103   magnesium hydroxide (MILK OF MAGNESIA) suspension 30 mL  30 mL Oral Daily PRN Rankin, Shuvon B, NP       melatonin tablet 5 mg  5 mg Oral QHS Arthor Captain, MD   5 mg at 11/28/20 2103   mirtazapine (REMERON) tablet 15 mg  15 mg Oral QHS Arthor Captain, MD   15 mg at 11/28/20 2103   polyethylene glycol (MIRALAX / GLYCOLAX) packet 17 g  17 g Oral Daily PRN Lindon Romp A, NP   17 g at 11/24/20 1730   risperiDONE (RISPERDAL) tablet 1.5 mg  1.5 mg Oral QHS Arthor Captain, MD   1.5 mg at 11/28/20 2103   sertraline (ZOLOFT) tablet 50 mg  50 mg Oral Daily Arthor Captain, MD   50 mg at 11/29/20 0998   traZODone (DESYREL) tablet 100 mg  100 mg Oral QHS Arthor Captain, MD       traZODone (DESYREL) tablet 50 mg  50 mg Oral QHS PRN Arthor Captain, MD   50 mg at 11/28/20 2103    Lab Results:  No results found for this or any previous visit (from the past 48 hour(s)).   Blood  Alcohol level:  Lab Results  Component Value Date   ETH <10 11/22/2020   ETH <10 33/82/5053    Metabolic Disorder Labs: Lab Results  Component Value Date   HGBA1C 5.5 11/24/2020   MPG 111.15 11/24/2020   No results found for: PROLACTIN Lab Results  Component Value Date   CHOL 237 (H)  11/24/2020   TRIG 192 (H) 11/24/2020   HDL 51 11/24/2020   CHOLHDL 4.6 11/24/2020   VLDL 38 11/24/2020   LDLCALC 148 (H) 11/24/2020    Physical Findings: AIMS: Facial and Oral Movements Muscles of Facial Expression: None, normal Lips and Perioral Area: None, normal Jaw: None, normal Tongue: None, normal,Extremity Movements Upper (arms, wrists, hands, fingers): None, normal Lower (legs, knees, ankles, toes): None, normal, Trunk Movements Neck, shoulders, hips: None, normal, Overall Severity Severity of abnormal movements (highest score from questions above): None, normal Incapacitation due to abnormal movements: None, normal Patient's awareness of abnormal movements (rate only patient's report): No Awareness, Dental Status Current problems with teeth and/or dentures?: Yes Does patient usually wear dentures?: No  CIWA:    COWS:     Musculoskeletal: Strength & Muscle Tone: within normal limits Gait & Station: normal Patient leans: N/A  Psychiatric Specialty Exam:  Presentation  General Appearance: Fairly Groomed  Eye Contact:Good  Speech:Clear and Coherent; Normal Rate  Speech Volume:Normal  Handedness:Right   Mood and Affect  Mood:Anxious; Depressed  Affect:Congruent   Thought Process  Thought Processes:Coherent; Goal Directed  Descriptions of Associations:Intact  Orientation:Full (Time, Place and Person)  Thought Content:Rumination (Somatic focus)  History of Schizophrenia/Schizoaffective disorder:No  Duration of Psychotic Symptoms:N/A  Hallucinations:Hallucinations: Visual; Auditory Description of Auditory Hallucinations: "Crowd noise" Description of Visual  Hallucinations: "Cobwebs in the corner of the room"  Ideas of Reference:None  Suicidal Thoughts:Suicidal Thoughts: Yes, Active (Denies thoughts of harming himself in the hospital)  Homicidal Thoughts:Homicidal Thoughts: No   Sensorium  Memory:Immediate Good; Recent Good  Judgment:Fair  Insight:Fair   Executive Functions  Concentration:Good  Attention Span:Good  [place] of Knowledge:Good  Language:Good   Psychomotor Activity  Psychomotor Activity:Psychomotor Activity: Normal   Assets  Assets:Communication Skills; Desire for Improvement; Housing; Vocational/Educational; Transportation   Sleep  Sleep:Sleep: Fair Number of Hours of Sleep: 6    Physical Exam: Physical Exam Vitals and nursing note reviewed.  Constitutional:      General: He is not in acute distress.    Appearance: Normal appearance. He is not diaphoretic.  HENT:     Head: Normocephalic and atraumatic.  Cardiovascular:     Rate and Rhythm: Normal rate.  Pulmonary:     Effort: Pulmonary effort is normal.  Neurological:     General: No focal deficit present.     Mental Status: He is alert and oriented to person, place, and time.   Review of Systems  Constitutional:  Positive for malaise/fatigue. Negative for chills, diaphoresis and fever.  HENT:  Negative for sore throat.   Respiratory:  Negative for cough and shortness of breath.   Cardiovascular:  Negative for chest pain and palpitations.  Gastrointestinal:  Negative for abdominal pain, constipation, diarrhea, nausea and vomiting.  Genitourinary:  Negative for dysuria.  Musculoskeletal:  Positive for joint pain. Negative for myalgias.       Positive for chronic arthritic pain in hands bilaterally; positive for chronic bilateral hip pain  Skin:  Negative for rash.  Neurological:  Negative for tremors and seizures.  Psychiatric/Behavioral:  Positive for depression, hallucinations and suicidal ideas. The patient is nervous/anxious  and has insomnia.    Blood pressure (!) 127/96, pulse 99, temperature 98.6 F (37 C), temperature source Oral, resp. rate 16, height 5' 8"  (1.727 m), weight 79.8 kg, SpO2 95 %. Body mass index is 26.76 kg/m.   Treatment Plan Summary: Patient is a 65 year old male with major depressive disorder recurrent and generalized anxiety  disorder.  Patient continues to report anxiety, trouble sleeping, depressed mood and suicidal ideation and cites his main stressors as dislike of his current housing situation and job outside the hospital.  Patient states his belief that he is unable to work or care for himself.  He reports ongoing chronic SI but denies thoughts of harming himself in the hospital.  Staff have not observed patient to attend or respond to internal stimuli although patient continues to report fleeting AH and VH.  Daily contact with patient to assess and evaluate symptoms and progress in treatment and Medication management  Major depressive disorder, recurrent -Continue Zoloft 50 mg daily for depression and anxiety.  -Continue mirtazapine 15 mg at bedtime for mood and sleep -Continue Risperdal 1.5 mg at bedtime for mood and hallucinations  Anxiety -Continue Zoloft 50 mg daily for depression and anxiety.  -Continue hydroxyzine 25 mg Q6H PRN anxiety  Insomnia -Continue melatonin 5 mg nightly -Start trazodone 100 mg nightly  -Continue trazodone 50 mg nightly PRN  Hypertension -Continue Norvasc 5 mg daily  Discharge planning in progress  Arthor Captain, MD 11/29/2020, 3:12 PM

## 2020-11-29 NOTE — Progress Notes (Signed)
Recreation Therapy Notes  Date: 8.22.22 Time: 0930 Location: [address] Dayroom  Group Topic: Stress Management   Goal Area(s) Addresses:  Patient will actively participate in stress management techniques presented during session.  Patient will successfully identify benefit of practicing stress management post d/c.   Behavioral Response:  Appropriate  Intervention: Relaxation exercise with ambient sound and script   Activity: Guided Imagery. LRT provided education, instruction, and demonstration on practice of visualization via guided imagery. Patient was asked to participate in the technique introduced during session.  Patients were given suggestions of ways to access scripts post d/c and encouraged to explore Youtube and other apps available on smartphones, tablets, and computers.  Education:  Stress Management, Discharge Planning.   Education Outcome:  Acknowledges education  Clinical Observations/Feedback: Patient actively engaged in technique introduced, expressed no concerns and demonstrated ability to practice skill independently post d/c.      Victorino Sparrow, LRT/CTRS         Victorino Sparrow A 11/29/2020 12:13 PM

## 2020-11-29 NOTE — Progress Notes (Signed)
Pt remains somatic on interactions. Denies HI, AVH and pain when assessed. Continues to endorse passive SI with plan to drink "antifreeze or crash my car when I leave here".  Verbally contracts for safety while hospitalized. Affect is flat with depressed mood. Gait is steady and slow; observed pacing hall at intervals, interacts minimally with peers and staff in milieu this shift. Tolerates all medications and meals well.  Emotional support and encouragement provided to pt. Q 15 minutes safety checks maintained without self harm gestures or outburst thus far. All medications given with verbal education and effects monitored.  Remains safe on unit without self harm gestures. Off unit to courtyard with peers and returned without issues.

## 2020-11-29 NOTE — Progress Notes (Addendum)
   11/29/20 2115  Psych Admission Type (Psych Patients Only)  Admission Status Voluntary  Psychosocial Assessment  Patient Complaints Anxiety;Depression;Worrying  Eye Contact Brief  Facial Expression Anxious;Worried  Affect Sullen;Sad;Apprehensive  Speech Logical/coherent  Interaction Forwards little;Minimal;Guarded  Motor Activity Slow  Appearance/Hygiene Unremarkable  Behavior Characteristics Cooperative;Anxious  Mood Depressed;Anxious;Pleasant  Thought Process  Coherency Concrete thinking  Content Preoccupation (with sleep)  Delusions None reported or observed  Perception WDL  Hallucination None reported or observed  Judgment Poor  Confusion None  Danger to Self  Current suicidal ideation? Passive  Self-Injurious Behavior No self-injurious ideation or behavior indicators observed or expressed   Agreement Not to Harm Self Yes  Description of Agreement Pt verbally  contracts for safety  Danger to Others  Danger to Others None reported or observed   Pt seen after attending evening wrap up group. Pt endorses passive SI without a plan. Pt contracts for safety. Pt denies HI, AVH and pain. Endorsing discomfort in his hands from arthritis. Fingers assessed. There is some swelling. Pt can move all joints without any issue. Rates anxiety and depression both 6/10. Depressed and sad affect. Pt concerned about sleep. "I haven't been sleeping. If I don't fall asleep, I can have the extra Trazodone?" Pt assured that he can have more sleep medicine if he needs it.

## 2020-11-30 DIAGNOSIS — F323 Major depressive disorder, single episode, severe with psychotic features: Secondary | ICD-10-CM | POA: Diagnosis not present

## 2020-11-30 MED ORDER — POLYETHYLENE GLYCOL 3350 17 G PO PACK
17.0000 g | PACK | Freq: Every day | ORAL | Status: DC
  Administered 2020-11-30: 17 g via ORAL
  Filled 2020-11-30 (×3): qty 1

## 2020-11-30 MED ORDER — MIRTAZAPINE 7.5 MG PO TABS
7.5000 mg | ORAL_TABLET | Freq: Every day | ORAL | Status: DC
  Administered 2020-11-30 – 2020-12-01 (×2): 7.5 mg via ORAL
  Filled 2020-11-30 (×3): qty 1
  Filled 2020-11-30: qty 7

## 2020-11-30 NOTE — Progress Notes (Signed)
Pt at this time denies SI/HI/AVH, has recent history of passive SI without plan or intent and verbally contracts not to harm self. Pt has a flat affect, is visible, does not initiate interaction with staff or peers, is hypoverbal. Will continue to monitor pt per Q15 minute face checks and monitor for safety and progress.

## 2020-11-30 NOTE — B[facility] Counselor (Signed)
CSW and Dr. Jeneen Rinks spoke with the Pt who states that he believes his life outside of the hospital is "a mess" and that this is his reason for feeling suicidal.  He states that he has no social life and is concerned that he has lost his job since being in the hospital.  [place] explained [place] and [facility] programs to the Pt.  He states that he is interested in these 2 programs and believes that "they are a step in the right direction and it is better then what I had before I came into the hospital".  The Pt states that he will continue to work on getting into a group home once he is out of the hospital.

## 2020-11-30 NOTE — [facility] Counselor (Signed)
CSW provided the Pt with a brochure of [facility] that he may review.  CSW sent in a referral to [facility] for the client.

## 2020-11-30 NOTE — [facility] Group Notes (Signed)
Patient did not attend group.    Spiritual care group on grief and loss facilitated by chaplain Katy Simon Llamas, [facility]   Group Goal:   Support / Education around grief and loss   Members engage in facilitated group support and psycho-social education.   Group Description:   Following introductions and group rules, group members engaged in facilitated group dialog and support around topic of loss, with particular support around experiences of loss in their lives. Group Identified types of loss (relationships / self / things) and identified patterns, circumstances, and changes that precipitate losses. Reflected on thoughts / feelings around loss, normalized grief responses, and recognized variety in grief experience. Group noted Worden's four tasks of grief in discussion.   Group drew on Adlerian / Rogerian, narrative, MI,    

## 2020-11-30 NOTE — Progress Notes (Signed)
Adult Psychoeducational Group Note  Date:  11/30/2020 Time:  9:51 PM  Group Topic/Focus:  Wrap-Up Group:   The focus of this group is to help patients review their daily goal of treatment and discuss progress on daily workbooks.  Participation Level:  Active  Participation Quality:  Appropriate and Attentive  Affect:  Appropriate  Cognitive:  Alert and Appropriate  Insight: Appropriate and Good  Engagement in Group:  Engaged  Modes of Intervention:  Discussion  Additional Comments: Review patient's goals, progress, and objectives on this date and examine group skills pertaining to treatment. Discuss symptoms, supportive counseling, identification, and exploration of emotions. In addition, we discussed substituting negative/maladaptive thoughts or images with positive/adaptive ones. Pt self-reporting has an overall day of 5 out of 10 on this date. End of Wrap-Up Group progress note.   Trinna Post 11/30/2020, 9:51 PM

## 2020-11-30 NOTE — Progress Notes (Signed)
[facility] MD Progress Note  11/30/2020 4:05 PM Casey Silva  MRN:  [id number]  Reason for admission:  Casey Silva is a 65 year old male with a history of major depressive disorder, brief psychotic disorder and insomnia who presented to [facility] as a walk-in on 11/22/2020 and reported worsening symptoms of depression, increased anxiety, increased insomnia and suicidal ideation with thoughts of wrecking his car.   Objective: Medical record reviewed.  Patient's case discussed with nursing staff and members of the treatment team.  I met with and evaluated the patient on the unit today for follow-up.  Patient looks better today.  He reports that he slept better last night and generally feels better.  Mood is improved.  He reports fleeting passive SI but denies intent or plan.  Patient states that he continues to experience brief visual hallucinations/visual illusions in dim light of cobwebs in the corners of the room that are fleeting.  He denies other VH.  He denies AH, PI, AI, HI.  Patient reports that the biggest factors negatively affecting his mood are his lack of social life, his job and his housing situation.  He feels he needs group home living situation.  Social worker was present for the beginning of our conversation today and discussed [facility] to [facility] who can assist patient with applying for Medicaid and eventually pursuing group home placement.  Social worker also discussed with patient the social/group events at [facility] and available through [facility].  Patient states he feels better knowing that he has possible pathway to group home and other assistance to address his social, occupational and housing concerns outside the hospital.  Patient reports no bowel movement in the last 2 days.  He denies other physical issues.  He reports feeling a little tired this morning from medications he took last night but denies other medication side effects.  I encouraged him not to take PRN  hydroxyzine at bedtime unless he feels anxious in order to avoid experiencing excessive fatigue in the morning and to avoid any unnecessary constipation.  Patient stated understanding.  Staff recorded 6.75 hours of sleep.  No new labs today.  Vital signs this morning include BP of 125/74 and pulse of 106.  The patient is taking scheduled medications as prescribed.  Principal Problem: Severe major depression with psychotic features ([place]) Diagnosis: Principal Problem:   Severe major depression with psychotic features ([place]) Active Problems:   GAD (generalized anxiety disorder)  Total Time spent with patient:  25 minutes  Past Psychiatric History: See admission H&P  Past Medical History:  Past Medical History:  Diagnosis Date   Anxiety    Depression    History reviewed. No pertinent surgical history. Family History:  Family History  Problem Relation Age of Onset   Bipolar disorder Mother    Family Psychiatric  History: See admission H&P Social History:  Social History   Substance and Sexual Activity  Alcohol Use No     Social History   Substance and Sexual Activity  Drug Use No    Social History   Socioeconomic History   Marital status: Single    Spouse name: Not on file   Number of children: 1   Years of education: Not on file   Highest education level: GED or equivalent  Occupational History   Not on file  Tobacco Use   Smoking status: Never   Smokeless tobacco: Never  Vaping Use   Vaping Use: Unknown  Substance and Sexual Activity  Alcohol use: No   Drug use: No   Sexual activity: Not Currently  Other Topics Concern   Not on file  Social History Narrative   Marital status:  Single      Children: none      Employment:  Unemployment.  Independently wealthy; previous work for [employer].      Tobacco:  1-2 cigarettes per day since age 47.      Alcohol:  None      Drugs:  none      Exercise:  Regularly very      Religion:  Jewish   Social  Determinants of Radio broadcast assistant Strain: Not on file  Food Insecurity: Not on file  Transportation Needs: Not on file  Physical Activity: Not on file  Stress: Not on file  Social Connections: Not on file   Additional Social History:                         Sleep: Fair  Appetite:  Good  Current Medications: Current Facility-Administered Medications  Medication Dose Route Frequency Provider Last Rate Last Admin   acetaminophen (TYLENOL) tablet 650 mg  650 mg Oral Q6H PRN Rankin, Shuvon B, NP   650 mg at 11/26/20 0810   alum & mag hydroxide-simeth (MAALOX/MYLANTA) 200-200-20 MG/5ML suspension 30 mL  30 mL Oral Q4H PRN Rankin, Shuvon B, NP       amLODipine (NORVASC) tablet 5 mg  5 mg Oral Daily Lindon Romp A, NP   5 mg at 11/30/20 0802   hydrOXYzine (ATARAX/VISTARIL) tablet 25 mg  25 mg Oral Q6H PRN Arthor Captain, MD   25 mg at 11/29/20 2114   magnesium hydroxide (MILK OF MAGNESIA) suspension 30 mL  30 mL Oral Daily PRN Rankin, Shuvon B, NP       melatonin tablet 5 mg  5 mg Oral QHS Arthor Captain, MD   5 mg at 11/29/20 2115   mirtazapine (REMERON) tablet 15 mg  15 mg Oral QHS Arthor Captain, MD   15 mg at 11/29/20 2115   polyethylene glycol (MIRALAX / GLYCOLAX) packet 17 g  17 g Oral Daily Arthor Captain, MD       risperiDONE (RISPERDAL) tablet 1.5 mg  1.5 mg Oral QHS Arthor Captain, MD   1.5 mg at 11/29/20 2115   sertraline (ZOLOFT) tablet 50 mg  50 mg Oral Daily Arthor Captain, MD   50 mg at 11/30/20 0802   traZODone (DESYREL) tablet 100 mg  100 mg Oral QHS Arthor Captain, MD   100 mg at 11/29/20 2115   traZODone (DESYREL) tablet 50 mg  50 mg Oral QHS PRN Arthor Captain, MD   50 mg at 11/28/20 2103    Lab Results:  No results found for this or any previous visit (from the past 48 hour(s)).   Blood Alcohol level:  Lab Results  Component Value Date   ETH <10 11/22/2020   ETH <10 78/67/6720    Metabolic Disorder Labs: Lab Results  Component  Value Date   HGBA1C 5.5 11/24/2020   MPG 111.15 11/24/2020   No results found for: PROLACTIN Lab Results  Component Value Date   CHOL 237 (H) 11/24/2020   TRIG 192 (H) 11/24/2020   HDL 51 11/24/2020   CHOLHDL 4.6 11/24/2020   VLDL 38 11/24/2020   LDLCALC 148 (H) 11/24/2020    Physical Findings: AIMS: Facial and  Oral Movements Muscles of Facial Expression: None, normal Lips and Perioral Area: None, normal Jaw: None, normal Tongue: None, normal,Extremity Movements Upper (arms, wrists, hands, fingers): None, normal Lower (legs, knees, ankles, toes): None, normal, Trunk Movements Neck, shoulders, hips: None, normal, Overall Severity Severity of abnormal movements (highest score from questions above): None, normal Incapacitation due to abnormal movements: None, normal Patient's awareness of abnormal movements (rate only patient's report): No Awareness, Dental Status Current problems with teeth and/or dentures?: Yes Does patient usually wear dentures?: No  CIWA:    COWS:     Musculoskeletal: Strength & Muscle Tone: within normal limits Gait & Station: normal Patient leans: N/A  Psychiatric Specialty Exam:  Presentation  General Appearance: Casual; Fairly Groomed  Eye Contact:Good  Speech:Clear and Coherent; Normal Rate  Speech Volume:Normal  Handedness:Right   Mood and Affect  Mood:Anxious; Depressed  Affect:Congruent   Thought Process  Thought Processes:Coherent; Goal Directed  Descriptions of Associations:Intact  Orientation:Full (Time, Place and Person)  Thought Content:Logical; WDL  History of Schizophrenia/Schizoaffective disorder:No  Duration of Psychotic Symptoms:N/A  Hallucinations:Hallucinations: Visual Description of Auditory Hallucinations: "Crowd noise" Description of Visual Hallucinations: "Cobwebs in the corner of the room and dim light"  Ideas of Reference:None  Suicidal Thoughts:Suicidal Thoughts: Yes, Passive SI Passive Intent  and/or Plan: Without Intent  Homicidal Thoughts:Homicidal Thoughts: No   Sensorium  Memory:Immediate Good; Recent Good  Judgment:Fair  Insight:Fair   Executive Functions  Concentration:Good  Attention Span:Good  Recall:Good  Fund of Knowledge:Good  Language:Good   Psychomotor Activity  Psychomotor Activity:Psychomotor Activity: Normal   Assets  Assets:Communication Skills; Desire for Improvement; Housing; Vocational/Educational; Transportation   Sleep  Sleep:Sleep: Good Number of Hours of Sleep: 6.75    Physical Exam: Physical Exam Vitals and nursing note reviewed.  Constitutional:      General: He is not in acute distress.    Appearance: Normal appearance. He is not diaphoretic.  HENT:     Head: Normocephalic and atraumatic.  Cardiovascular:     Rate and Rhythm: Tachycardia present.  Pulmonary:     Effort: Pulmonary effort is normal.  Neurological:     General: No focal deficit present.     Mental Status: He is alert and oriented to person, place, and time.   Review of Systems  Constitutional:  Negative for chills, diaphoresis, fever and malaise/fatigue.  HENT:  Negative for sore throat.   Respiratory:  Negative for cough and shortness of breath.   Cardiovascular:  Negative for chest pain and palpitations.  Gastrointestinal:  Negative for abdominal pain, constipation, diarrhea, nausea and vomiting.  Genitourinary:  Negative for dysuria.  Musculoskeletal:  Positive for joint pain. Negative for myalgias.       Positive for chronic arthritic pain in hands bilaterally; positive for chronic bilateral hip pain  Skin:  Negative for rash.  Neurological:  Negative for tremors and seizures.  Psychiatric/Behavioral:  Positive for depression, hallucinations and suicidal ideas. The patient is nervous/anxious. The patient does not have insomnia.    Blood pressure 125/74, pulse (!) 106, temperature 98.6 F (37 C), temperature source Oral, resp. rate 16, height  5' 8"  (1.727 m), weight 79.8 kg, SpO2 95 %. Body mass index is 26.76 kg/m.   Treatment Plan Summary: Patient is a 65 year old male with major depressive disorder recurrent and generalized anxiety disorder.  Patient is improving on current medications with better sleep, better mood and decreased intensity and frequency of suicidal thoughts current medication.  He denied active suicidal ideation today and endorsed only fleeting  passive SI.  He cites his main stressors as dislike of his current housing situation and job outside the hospital.  Patient reported improved mood today, particularly after talking with social worker who explained resources outside the hospital which he can use to address these stressors.  Daily contact with patient to assess and evaluate symptoms and progress in treatment and Medication management  Major depressive disorder, recurrent -Continue Zoloft 50 mg daily for depression and anxiety.  -Decrease mirtazapine to 7.5 mg at bedtime for mood and sleep.  Plan is to eventually discontinue as outpatient. -Continue Risperdal 1.5 mg at bedtime for mood and hallucinations  Anxiety -Continue Zoloft 50 mg daily for depression and anxiety.  -Continue hydroxyzine 25 mg Q6H PRN anxiety  Insomnia -Continue melatonin 5 mg nightly -Continue trazodone 100 mg nightly  -Continue trazodone 50 mg nightly PRN  Hypertension -Continue Norvasc 5 mg daily  Discharge planning in progress  Arthor Captain, MD 11/30/2020, 4:05 PM

## 2020-12-01 DIAGNOSIS — F323 Major depressive disorder, single episode, severe with psychotic features: Secondary | ICD-10-CM | POA: Diagnosis not present

## 2020-12-01 MED ORDER — DOCUSATE SODIUM 100 MG PO CAPS
100.0000 mg | ORAL_CAPSULE | Freq: Every day | ORAL | Status: DC
  Administered 2020-12-01 – 2020-12-02 (×2): 100 mg via ORAL
  Filled 2020-12-01 (×4): qty 1

## 2020-12-01 MED ORDER — POLYETHYLENE GLYCOL 3350 17 G PO PACK
17.0000 g | PACK | Freq: Every day | ORAL | Status: DC
  Administered 2020-12-01 – 2020-12-02 (×2): 17 g via ORAL
  Filled 2020-12-01 (×2): qty 1
  Filled 2020-12-01: qty 7

## 2020-12-01 NOTE — Progress Notes (Signed)
[facility] MD Progress Note  12/01/2020 4:43 PM Casey Silva  MRN:  [id number]  Reason for admission:  Casey Silva is a 65 year old male with a history of major depressive disorder, brief psychotic disorder and insomnia who presented to [facility] as a walk-in on 11/22/2020 and reported worsening symptoms of depression, increased anxiety, increased insomnia and suicidal ideation with thoughts of wrecking his car.   Objective: Medical record reviewed.  Patient's case discussed with nursing staff and members of the treatment team.  I met with and evaluated the patient on the unit today for follow-up.  Patient is similar to yesterday.  He subjectively reports being less anxious and less depressed knowing that there are outpatient resources who can assist him with applying for benefits so that he can work toward his goal of group home living.  Patient states the main factors contributing to his anxiety and depression are his lack of social life and current housing situation.  He continues to have fleeting passive thoughts that it would be easier not to be alive but denies intent or plan to end his life.  He denies AI, HI, PI.  He reports that perceptual abnormalities have diminished.  He experiences fleeting AH of "crowd noise" and fleeting visual illusions of "cobwebs in the corner" at night.  He reports adequate appetite.  He is sleeping better than prior to admission.  Patient denies any medication side effects.  He continues to report concerns about constipation but denies other physical problems.  Staff document that patient slept 6.75 hours last night.  Vital signs are stable and within normal limits.  No new labs today.  Patient is taking scheduled medications as prescribed.  He did not take any PRN medications yesterday and has taken 1 dose of PRN hydroxyzine 25 mg thus far today for anxiety.  Staff document the patient has been denying thoughts of harming himself or others and has been denying perceptual  abnormalities.  Principal Problem: Severe major depression with psychotic features ([place]) Diagnosis: Principal Problem:   Severe major depression with psychotic features ([place]) Active Problems:   GAD (generalized anxiety disorder)  Total Time spent with patient:  25 minutes  Past Psychiatric History: See admission H&P  Past Medical History:  Past Medical History:  Diagnosis Date   Anxiety    Depression    History reviewed. No pertinent surgical history. Family History:  Family History  Problem Relation Age of Onset   Bipolar disorder Mother    Family Psychiatric  History: See admission H&P Social History:  Social History   Substance and Sexual Activity  Alcohol Use No     Social History   Substance and Sexual Activity  Drug Use No    Social History   Socioeconomic History   Marital status: Single    Spouse name: Not on file   Number of children: 1   Years of education: Not on file   Highest education level: GED or equivalent  Occupational History   Not on file  Tobacco Use   Smoking status: Never   Smokeless tobacco: Never  Vaping Use   Vaping Use: Unknown  Substance and Sexual Activity   Alcohol use: No   Drug use: No   Sexual activity: Not Currently  Other Topics Concern   Not on file  Social History Narrative   Marital status:  Single      Children: none      Employment:  Unemployment.  Independently wealthy; previous work for [employer].  Tobacco:  1-2 cigarettes per day since age 12.      Alcohol:  None      Drugs:  none      Exercise:  Regularly very      Religion:  Jewish   Social Determinants of Radio broadcast assistant Strain: Not on file  Food Insecurity: Not on file  Transportation Needs: Not on file  Physical Activity: Not on file  Stress: Not on file  Social Connections: Not on file   Additional Social History:                         Sleep: Fair  Appetite:  Good  Current Medications: Current  Facility-Administered Medications  Medication Dose Route Frequency Provider Last Rate Last Admin   acetaminophen (TYLENOL) tablet 650 mg  650 mg Oral Q6H PRN Rankin, Shuvon B, NP   650 mg at 11/26/20 0810   alum & mag hydroxide-simeth (MAALOX/MYLANTA) 200-200-20 MG/5ML suspension 30 mL  30 mL Oral Q4H PRN Rankin, Shuvon B, NP       amLODipine (NORVASC) tablet 5 mg  5 mg Oral Daily Lindon Romp A, NP   5 mg at 12/01/20 0745   hydrOXYzine (ATARAX/VISTARIL) tablet 25 mg  25 mg Oral Q6H PRN Arthor Captain, MD   25 mg at 12/01/20 0745   magnesium hydroxide (MILK OF MAGNESIA) suspension 30 mL  30 mL Oral Daily PRN Rankin, Shuvon B, NP       melatonin tablet 5 mg  5 mg Oral QHS Arthor Captain, MD   5 mg at 11/30/20 2130   mirtazapine (REMERON) tablet 7.5 mg  7.5 mg Oral QHS Arthor Captain, MD   7.5 mg at 11/30/20 2131   polyethylene glycol (MIRALAX / GLYCOLAX) packet 17 g  17 g Oral Daily Arthor Captain, MD   17 g at 12/01/20 1031   risperiDONE (RISPERDAL) tablet 1.5 mg  1.5 mg Oral QHS Arthor Captain, MD   1.5 mg at 11/30/20 2130   sertraline (ZOLOFT) tablet 50 mg  50 mg Oral Daily Arthor Captain, MD   50 mg at 12/01/20 0745   traZODone (DESYREL) tablet 100 mg  100 mg Oral QHS Arthor Captain, MD   100 mg at 11/30/20 2131   traZODone (DESYREL) tablet 50 mg  50 mg Oral QHS PRN Arthor Captain, MD   50 mg at 11/28/20 2103    Lab Results:  No results found for this or any previous visit (from the past 48 hour(s)).   Blood Alcohol level:  Lab Results  Component Value Date   ETH <10 11/22/2020   ETH <10 95/62/1308    Metabolic Disorder Labs: Lab Results  Component Value Date   HGBA1C 5.5 11/24/2020   MPG 111.15 11/24/2020   No results found for: PROLACTIN Lab Results  Component Value Date   CHOL 237 (H) 11/24/2020   TRIG 192 (H) 11/24/2020   HDL 51 11/24/2020   CHOLHDL 4.6 11/24/2020   VLDL 38 11/24/2020   LDLCALC 148 (H) 11/24/2020    Physical Findings: AIMS: Facial and Oral  Movements Muscles of Facial Expression: None, normal Lips and Perioral Area: None, normal Jaw: None, normal Tongue: None, normal,Extremity Movements Upper (arms, wrists, hands, fingers): None, normal Lower (legs, knees, ankles, toes): None, normal, Trunk Movements Neck, shoulders, hips: None, normal, Overall Severity Severity of abnormal movements (highest score from questions above): None, normal Incapacitation due to abnormal  movements: None, normal Patient's awareness of abnormal movements (rate only patient's report): No Awareness, Dental Status Current problems with teeth and/or dentures?: Yes Does patient usually wear dentures?: No  CIWA:    COWS:     Musculoskeletal: Strength & Muscle Tone: within normal limits Gait & Station: normal Patient leans: N/A  Psychiatric Specialty Exam:  Presentation  General Appearance: Casual; Fairly Groomed  Eye Contact:Good  Speech:Clear and Coherent; Normal Rate  Speech Volume:Normal  Handedness:Right   Mood and Affect  Mood:Anxious ("Better".  Less anxious, less depressed)  Affect:Constricted   Thought Process  Thought Processes:Coherent; Goal Directed  Descriptions of Associations:Intact  Orientation:Full (Time, Place and Person)  Thought Content:Logical  History of Schizophrenia/Schizoaffective disorder:No  Duration of Psychotic Symptoms:N/A  Hallucinations:Hallucinations: None Description of Visual Hallucinations: "Cobwebs in the corner of the room and dim light"  Ideas of Reference:None  Suicidal Thoughts:Suicidal Thoughts: Yes, Passive SI Passive Intent and/or Plan: Without Intent  Homicidal Thoughts:Homicidal Thoughts: No   Sensorium  Memory:Immediate Good; Recent Good  Judgment:Fair  Insight:Fair   Executive Functions  Concentration:Good  Attention Span:Good  Recall:Good  Fund of Knowledge:Good  Language:Good   Psychomotor Activity  Psychomotor Activity:Psychomotor Activity:  Normal   Assets  Assets:Communication Skills; Desire for Improvement; Housing; Vocational/Educational; Transportation   Sleep  Sleep:Sleep: Fair Number of Hours of Sleep: 6.75    Physical Exam: Physical Exam Vitals and nursing note reviewed.  Constitutional:      General: He is not in acute distress.    Appearance: Normal appearance. He is not diaphoretic.  HENT:     Head: Normocephalic and atraumatic.  Cardiovascular:     Rate and Rhythm: Normal rate.  Pulmonary:     Effort: Pulmonary effort is normal.  Neurological:     General: No focal deficit present.     Mental Status: He is alert and oriented to person, place, and time.   Review of Systems  Constitutional:  Negative for chills, diaphoresis, fever and malaise/fatigue.  HENT:  Negative for sore throat.   Respiratory:  Negative for cough and shortness of breath.   Cardiovascular:  Negative for chest pain and palpitations.  Gastrointestinal:  Negative for abdominal pain, constipation, diarrhea, nausea and vomiting.  Genitourinary:  Negative for dysuria.  Musculoskeletal:  Positive for joint pain. Negative for myalgias.       Positive for chronic arthritic pain in hands bilaterally; positive for chronic bilateral hip pain  Skin:  Negative for rash.  Neurological:  Negative for tremors and seizures.  Psychiatric/Behavioral:  Positive for depression and suicidal ideas. Negative for hallucinations. The patient is nervous/anxious. The patient does not have insomnia.    Blood pressure 135/87, pulse 95, temperature 98.6 F (37 C), temperature source Oral, resp. rate 16, height 5' 8"  (1.727 m), weight 79.8 kg, SpO2 95 %. Body mass index is 26.76 kg/m.   Treatment Plan Summary: Patient is a 64 year old male with major depressive disorder recurrent and generalized anxiety disorder.  Patient is improving on current medications with better sleep, better mood and decreased suicidal thoughts.  Patient is nearing readiness for  discharge.  Daily contact with patient to assess and evaluate symptoms and progress in treatment and Medication management  Major depressive disorder, recurrent -Continue Zoloft 50 mg daily for depression and anxiety.  -Continue mirtazapine 7.5 mg at bedtime for mood and sleep.  Plan is to eventually discontinue as outpatient. -Continue Risperdal 1.5 mg at bedtime for mood and hallucinations  Anxiety -Continue Zoloft 50 mg daily for depression and  anxiety.  -Continue hydroxyzine 25 mg Q6H PRN anxiety  Insomnia -Continue melatonin 5 mg nightly -Continue trazodone 100 mg nightly  -Continue trazodone 50 mg nightly PRN  Hypertension -Continue Norvasc 5 mg daily  Constipation -Continue MiraLAX 17 g daily -Start Colace 100 mg daily -Encouraged p.o. hydration  Discharge planning in progress  Arthor Captain, MD 12/01/2020, 4:43 PM

## 2020-12-01 NOTE — Progress Notes (Signed)
DAR NOTE: Pt present with flat affect and depressed mood in the unit. Pt has been isolating himself and has been in bed most of the time. Pt denies physical pain, took all his meds as scheduled. Pt's safety ensured with 15 minute and environmental checks. Pt currently denies SI/HI and A/V hallucinations. Pt verbally agrees to seek staff if SI/HI or A/VH occurs and to consult with staff before acting on these thoughts. Will continue POC.

## 2020-12-01 NOTE — [facility] Group Notes (Signed)
Type of Therapy and Topic:  Group Therapy - Healthy vs Unhealthy Coping Skills  Participation Level:  Active   Description of Group The focus of this group was to determine what unhealthy coping techniques typically are used by group members and what healthy coping techniques would be helpful in coping with various problems. Patients were guided in becoming aware of the differences between healthy and unhealthy coping techniques. Patients were asked to identify 2-3 healthy coping skills they would like to learn to use more effectively.  Therapeutic Goals 1. Patients learned that coping is what human beings do all day long to deal with various situations in their lives 2. Patients defined and discussed healthy vs unhealthy coping techniques 3. Patients identified their preferred coping techniques and identified whether these were healthy or unhealthy 4. Patients determined 2-3 healthy coping skills they would like to become more familiar with and use more often. 5. Patients provided support and ideas to each other   Summary of Patient Progress:  Pt was provided with a worksheet to complete and share with the group.  Pt accepted this worksheet and was appropriate.  Pt was encouraged to speak with the social worker if they had any questions or concerns.

## 2020-12-01 NOTE — Progress Notes (Signed)
Recreation Therapy Notes  Date:  8.24.22 Time: 0930 Location: [address] Dayroom  Group Topic: Stress Management  Goal Area(s) Addresses:  Patient will identify positive stress management techniques. Patient will identify benefits of using stress management post d/c.  Intervention: Stress Management  Activity :  Meditation.  LRT played a meditation that focused on being resilient in the face of adversity and managing reactions to situations beyond your control.  Education:  Stress Management, Discharge Planning.   Education Outcome: Acknowledges Education  Clinical Observations/Feedback:  Pt did not attend group session.    Victorino Sparrow, LRT/CTRS         Victorino Sparrow A 12/01/2020 12:22 PM

## 2020-12-01 NOTE — Progress Notes (Signed)
Pt presents with some anxiety and requested prn vistaril with good results.  Pt denies SI/HI/AVH.  Pt took medications without incident and no adverse reactions were noted. RN will continue to monitor and provide support as needed. Pt remains safe with q 15 min checks in place.  12/01/20 1145  Psych Admission Type (Psych Patients Only)  Admission Status Voluntary  Psychosocial Assessment  Patient Complaints Anxiety  Eye Contact Brief  Facial Expression Anxious;Worried  Affect Sullen;Sad;Apprehensive  Speech Logical/coherent  Interaction Forwards little;Minimal;Guarded  Motor Activity Slow  Appearance/Hygiene Unremarkable  Behavior Characteristics Anxious  Mood Depressed;Anxious  Thought Process  Coherency Concrete thinking  Content WDL  Delusions None reported or observed  Perception WDL  Hallucination None reported or observed  Judgment Poor  Confusion None  Danger to Self  Current suicidal ideation? Denies  Self-Injurious Behavior No self-injurious ideation or behavior indicators observed or expressed   Agreement Not to Harm Self Yes  Description of Agreement Pt verbally  contracts for safety  Danger to Others  Danger to Others None reported or observed

## 2020-12-01 NOTE — [facility] Group Notes (Signed)
[place] Group Notes:  (Nursing/MHT/Case Management/Adjunct)  Date:  12/01/2020  Time:  10:18 AM  Type of Therapy:  Pts were asked to talk about their daily and long term goals   Participation Level:  Active  Participation Quality:  Appropriate  Affect:  Appropriate  Cognitive:  Appropriate  Insight:  Appropriate  Engagement in Group:  Engaged  Modes of Intervention:  Discussion  Summary of Progress/Problems: Pt stated his goal for the day is to figure out discharge plan. And long term he hopes to get accepted into a group home. Steffanie Dunn Marvyn Torrez 12/01/2020, 10:18 AM

## 2020-12-01 NOTE — Progress Notes (Signed)
Adult Psychoeducational Group Note  Date:  12/01/2020 Time:  10:48 PM  Group Topic/Focus:  Wrap-Up Group:   The focus of this group is to help patients review their daily goal of treatment and discuss progress on daily workbooks.  Participation Level:  Did Not Attend  Participation Quality:  Not Applicable  Affect:  Not Applicable  Cognitive:  Not Applicable  Insight: None  Engagement in Group:  Not Applicable  Modes of Intervention:  Not Applicable  Additional Comments:    Trinna Post 12/01/2020, 10:48 PM

## 2020-12-02 ENCOUNTER — Other Ambulatory Visit: Payer: Self-pay

## 2020-12-02 DIAGNOSIS — F323 Major depressive disorder, single episode, severe with psychotic features: Secondary | ICD-10-CM | POA: Diagnosis not present

## 2020-12-02 MED ORDER — HYDROXYZINE HCL 25 MG PO TABS
25.0000 mg | ORAL_TABLET | Freq: Four times a day (QID) | ORAL | 0 refills | Status: DC | PRN
  Filled 2020-12-02 – 2020-12-09 (×2): qty 75, 19d supply, fill #0

## 2020-12-02 MED ORDER — MIRTAZAPINE 7.5 MG PO TABS
7.5000 mg | ORAL_TABLET | Freq: Every day | ORAL | 0 refills | Status: DC
  Filled 2020-12-02 – 2020-12-09 (×2): qty 30, 30d supply, fill #0

## 2020-12-02 MED ORDER — RISPERIDONE 0.5 MG PO TABS
1.5000 mg | ORAL_TABLET | Freq: Every day | ORAL | 0 refills | Status: DC
Start: 2020-12-02 — End: 2020-12-28
  Filled 2020-12-02: qty 90, 30d supply, fill #0

## 2020-12-02 MED ORDER — POLYETHYLENE GLYCOL 3350 17 G PO PACK: 17.0000 g | PACK | Freq: Every day | ORAL | 0 refills | Status: DC

## 2020-12-02 MED ORDER — SERTRALINE HCL 50 MG PO TABS
50.0000 mg | ORAL_TABLET | Freq: Every day | ORAL | 0 refills | Status: DC
  Filled 2020-12-02 – 2020-12-09 (×2): qty 30, 30d supply, fill #0

## 2020-12-02 MED ORDER — MELATONIN 5 MG PO TABS
5.0000 mg | ORAL_TABLET | Freq: Every day | ORAL | 0 refills | Status: DC
  Filled 2020-12-02: qty 30, 30d supply, fill #0

## 2020-12-02 MED ORDER — AMLODIPINE BESYLATE 5 MG PO TABS
5.0000 mg | ORAL_TABLET | Freq: Every day | ORAL | 0 refills | Status: DC
  Filled 2020-12-02 – 2020-12-09 (×2): qty 30, 30d supply, fill #0

## 2020-12-02 MED ORDER — DOCUSATE SODIUM 100 MG PO CAPS: 100.0000 mg | ORAL_CAPSULE | Freq: Every day | ORAL | 0 refills | Status: DC

## 2020-12-02 MED ORDER — TRAZODONE HCL 100 MG PO TABS
100.0000 mg | ORAL_TABLET | Freq: Every day | ORAL | 0 refills | Status: DC
  Filled 2020-12-02 – 2020-12-09 (×3): qty 30, 30d supply, fill #0

## 2020-12-02 NOTE — Discharge Summary (Signed)
Physician Discharge Summary Note  Patient:  Casey Silva is an 65 y.o., male MRN:  [id number] DOB:  [birth year] Patient phone:  [phone] (home)  Patient address:   [place] [address],  Total Time spent with patient:  Greater than 30 minutes  Date of Admission:  11/23/2020  Date of Discharge: 12-02-20  Reason for Admission: Patient reported worsening symptoms of depression, increased anxiety, increased insomnia and suicidal ideation with thoughts of wrecking his car.  Principal Problem: Severe major depression with psychotic features [facility])  Discharge Diagnoses: Principal Problem:   Severe major depression with psychotic features ([place]) Active Problems:   GAD (generalized anxiety disorder)  Past Psychiatric History: See H&P  Past Medical History:  Past Medical History:  Diagnosis Date   Anxiety    Depression    History reviewed. No pertinent surgical history. Family History:  Family History  Problem Relation Age of Onset   Bipolar disorder Mother    Family Psychiatric  History: See H&P  Social History:  Social History   Substance and Sexual Activity  Alcohol Use No     Social History   Substance and Sexual Activity  Drug Use No    Social History   Socioeconomic History   Marital status: Single    Spouse name: Not on file   Number of children: 1   Years of education: Not on file   Highest education level: GED or equivalent  Occupational History   Not on file  Tobacco Use   Smoking status: Never   Smokeless tobacco: Never  Vaping Use   Vaping Use: Unknown  Substance and Sexual Activity   Alcohol use: No   Drug use: No   Sexual activity: Not Currently  Other Topics Concern   Not on file  Social History Narrative   Marital status:  Single      Children: none      Employment:  Unemployment.  Independently wealthy; previous work for [employer].      Tobacco:  1-2 cigarettes per day since age 35.      Alcohol:   None      Drugs:  none      Exercise:  Regularly very      Religion:  Jewish   Social Determinants of Radio broadcast assistant Strain: Not on file  Food Insecurity: Not on file  Transportation Needs: Not on file  Physical Activity: Not on file  Stress: Not on file  Social Connections: Not on file   Hospital Course: (Per Md's admission evaluation notes):  Casey Silva is a 65 year old male with a history of major depressive disorder, brief psychotic disorder and insomnia who presented to [facility] as a walk-in on 11/22/2020 and reported worsening symptoms of depression, increased anxiety, increased insomnia and suicidal ideation with thoughts of wrecking his car.  Patient also reported that approximately 2 weeks ago he attempted suicide by cutting himself in the stomach which just required "a Band-Aid" and examination of abdomen showed 1 to 2 mm healed scar without surrounding erythema, drainage or signs of infection.  Patient reported symptoms of depressed mood, decreased sleep (estimates 2 hours total sleep per night), anhedonia, guilt, hopelessness, worthlessness, low energy, problems concentrating, decreased appetite and suicidal ideation.  On evaluation at [facility] patient also reported nonspecific paranoia, visual hallucinations of birds and other animals on the exam floor and auditory hallucinations of laughter and people criticizing him over the past few weeks.  Patient's home psychiatric  medications included Remeron 45 mg nightly, Rexulti 4 mg daily, trazodone 100 mg nightly PRN and Vistaril 50 mg TID PRN.  Patient was referred for inpatient psychiatric admission and sent to [facility] ED. in the ED acetaminophen and salicylate levels were undetectable.  BAL was <10 and urine tox screen was negative.  EKG was not performed.  He was admitted yesterday to [facility] and endorsed the same depressive symptoms as described above.  Patient also stated that job stress as well as his current living situation at his  boardinghouse are exacerbating his psychiatric symptoms and he would like assistance with locating a group home.  He has continued to endorse suicidal ideation with thoughts of wrecking his car or cutting himself.  Staff have not observed him to be responding to internal stimuli.  On evaluation with me today, patient presents with flat affect, mild psychomotor slowing with decreased arm swing, anxious depressed mood and somatic focus.  He endorses ongoing suicidal ideation but denies thoughts of harming himself in the hospital.  He states that he has been researching ways to kill himself and has thought about drinking radiator fluid or wrecking his car.  Patient states that he was admitted in June 2021 to [facility] (chart review shows admission from 09/24/2019 until 09/26/2019 for depression, anxiety, brief psychotic disorder and suicidal ideation treated with Risperdal 3 mg at bedtime, sertraline 25 mg daily and trazodone 50 mg at night).  Patient states that he did okay after this inpatient psychiatric admission but his depressive symptoms started to worsen in January 2022.  He feels that his sleep has been especially bad over the last month and also over the last month he has experienced AH of music but denies other AH.  He denies VH today.  He reports physical issues of hip weakness and upper leg weakness, blurred vision, pain bilaterally in his hands, constipation, increased muscle tension and occasional dizziness.  He denies AI or HI.  He reports vague PI with belief that his neighbors may be deliberately trying to irritate him by flushing the toilet when he is in the shower to cause the water temperature to change but he denies other paranoid concerns today.  He denies use of alcohol, drugs, tobacco or nicotine.   Prior to this discharge, Aramis was seen & evaluated for mental health stability. The current laboratory findings were reviewed (stable), nurses notes & vital signs were reviewed as well. There are no  current mental health or medical issues that should prevent this discharge at this time. Patient is being discharged to continue mental health care/medication management as noted below.  This is Billey's second psychiatric admission/discharge summary in this [facility]. He was a patient in this [facility] from 09-24-19 thru 09-26-19 for worsening suicidal ideations. He was admitted to the [facility] this time around for worsening symptoms of depression, increased anxiety, increased insomnia & suicidal ideation with thoughts of wrecking his car. He was in need of evaluation/treatment.   After his admission evaluation this time around, Rashawd's presenting symptoms were noted. He was recommended for mood stabilization treatments. The medication regimen targeting those presenting symptoms were discussed with him & initiated with his consent. He was medicated, stabilized & discharged on the medications as listed on his discharge medication lists below. Besides the mood stabilization treatments, he was also enrolled & participated in the group counseling sessions being offered & held on this unit. He learned coping skills. He also presented other significant pre-existing medical issues that required treatment. He was  resumed & discharged on all his pertinent home medications for those health issues. He tolerated his treatment regimen without any adverse effects or reactions reported. Part of Clerence's discharge plan besides medication management/routine psychiatric were referral to other social services with the community for assistance with meals, medications, transportation & other in-home assistance/services.  Dominic's symptoms responded well to his treatment regimen warranting this discharge. This is evidenced by his daily reports of improved mood, presentation of good affect & absence of suicidal thoughts. He is currently mentally & medically stable to be discharged to continue mental health care & medication management on an outpatient  basis. During the course of this hospitalization, the 15-minute checks were adequate to ensure Xavyer's safety.  Patient did not display any dangerous, violent or suicidal behavior on the unit.  He interacted with patients & staff appropriately, participated appropriately in the group sessions/therapies. His medications were addressed & adjusted to meet his needs. He was recommended for outpatient follow-up care & medication management upon discharge to assure continuity of care & mood stability.  At the time of discharge, patient is not reporting any acute suicidal/homicidal ideations. He currently denies any new issues or concerns. Education and supportive counseling provided throughout his hospital stay & upon discharge.  Today upon his discharge evaluation with the attending psychiatrist, Cambron presents much better & more mentally stable than when first admitted to the hospital. He denies any other specific concerns. He is sleeping well. His appetite is good. He denies other physical complaints. He denies AH/VH, delusional thoughts or paranoia. He feels that his medications have been helpful & is in agreement to continue his current treatment regimen as recommended. He was able to engage in safety planning, including plan to return to [facility] or contact emergency services if he feels unable to maintain his own safety or the safety of others. Pt had no further questions, comments, or concerns. He left [facility] with all personal belongings in no apparent distress. Transportation per the safe Government social research officer.   Physical Findings: AIMS: Facial and Oral Movements Muscles of Facial Expression: None, normal Lips and Perioral Area: None, normal Jaw: None, normal Tongue: None, normal,Extremity Movements Upper (arms, wrists, hands, fingers): None, normal Lower (legs, knees, ankles, toes): None, normal, Trunk Movements Neck, shoulders, hips: None, normal, Overall Severity Severity of abnormal movements (highest score  from questions above): None, normal Incapacitation due to abnormal movements: None, normal Patient's awareness of abnormal movements (rate only patient's report): No Awareness, Dental Status Current problems with teeth and/or dentures?: Yes Does patient usually wear dentures?: No  CIWA:    COWS:     Musculoskeletal: Strength & Muscle Tone: within normal limits Gait & Station: normal Patient leans: N/A  Psychiatric Specialty Exam: Physical Exam Vitals and nursing note reviewed.  Constitutional:      General: He is not in acute distress.    Appearance: He is not ill-appearing, toxic-appearing or diaphoretic.  HENT:     Head: Normocephalic.     Nose: Nose normal.     Mouth/Throat:     Pharynx: Oropharynx is clear.  Eyes:     Pupils: Pupils are equal, round, and reactive to light.  Cardiovascular:     Rate and Rhythm: Normal rate.     Pulses: Normal pulses.  Pulmonary:     Effort: No respiratory distress.     Breath sounds: No wheezing.  Chest:     Chest wall: No tenderness.  Genitourinary:    Comments: Deferred Musculoskeletal:  General: Normal range of motion.     Cervical back: Normal range of motion.  Skin:    General: Skin is warm and dry.  Neurological:     General: No focal deficit present.     Mental Status: He is alert and oriented to person, place, and time. Mental status is at baseline.    Review of Systems  Constitutional:  Negative for chills, diaphoresis and fever.  HENT:  Negative for congestion, rhinorrhea, sneezing and sore throat.   Eyes:  Negative for discharge.  Respiratory:  Negative for apnea.   Cardiovascular:  Negative for chest pain and palpitations.  Gastrointestinal:  Negative for diarrhea, nausea and vomiting.  Endocrine: Negative for cold intolerance.  Genitourinary:  Negative for difficulty urinating.  Musculoskeletal:  Negative for arthralgias and myalgias.  Allergic/Immunologic: Positive for environmental allergies (Dust mite,  Pollen).       Allergies: NKDA  Neurological:  Negative for dizziness, tremors, seizures, syncope, speech difficulty, weakness, light-headedness and headaches.  Psychiatric/Behavioral:  Positive for dysphoric mood ((stable on medication)), hallucinations (hx. of (stable on medication)) and sleep disturbance (Stabilized with medication prior to discharge). Negative for agitation, behavioral problems, confusion, decreased concentration, self-injury and suicidal ideas. The patient is not nervous/anxious (Stable upon discharge) and is not hyperactive.    Blood pressure (!) 137/91, pulse (!) 105, temperature 98.2 F (36.8 C), temperature source Oral, resp. rate 16, height '5\' 8"'$  (1.727 m), weight 79.8 kg, SpO2 97 %.Body mass index is 26.76 kg/m.  See Md's discharge SRA  Sleep:  Number of Hours: 6.75   Has this patient used any form of tobacco in the last 30 days? (Cigarettes, Smokeless Tobacco, Cigars, and/or Pipes): N/A  Blood Alcohol level:  Lab Results  Component Value Date   ETH <10 11/22/2020   ETH <10 123XX123   Metabolic Disorder Labs:  Lab Results  Component Value Date   HGBA1C 5.5 11/24/2020   MPG 111.15 11/24/2020   No results found for: PROLACTIN Lab Results  Component Value Date   CHOL 237 (H) 11/24/2020   TRIG 192 (H) 11/24/2020   HDL 51 11/24/2020   CHOLHDL 4.6 11/24/2020   VLDL 38 11/24/2020   LDLCALC 148 (H) 11/24/2020    See Psychiatric Specialty Exam and Suicide Risk Assessment completed by Attending Physician prior to discharge.  Discharge destination:  Home  Is patient on multiple antipsychotic therapies at discharge:  No   Has Patient had three or more failed trials of antipsychotic monotherapy by history:  No  Recommended Plan for Multiple Antipsychotic Therapies: NA  Allergies as of 12/02/2020       Reactions   Dust Mite Mixed Allergen Ext [mite (d. Farinae)] Shortness Of Breath   Bee Pollen Other (See Comments)   Watery Eyes        Medication  List     STOP taking these medications    aspirin 81 MG EC tablet   lidocaine 2 % solution Commonly known as: XYLOCAINE   lisinopril 10 MG tablet Commonly known as: ZESTRIL   losartan 25 MG tablet Commonly known as: COZAAR   Rexulti 3 MG Tabs Generic drug: Brexpiprazole       TAKE these medications      Indication  amLODipine 5 MG tablet Commonly known as: NORVASC Take 1 tablet (5 mg total) by mouth daily. For high blood pressure Start taking on: December 03, 2020  Indication: High Blood Pressure Disorder   docusate sodium 100 MG capsule Commonly known as: COLACE Take  1 capsule (100 mg total) by mouth daily. (May buy from over the counter): For constipation Start taking on: December 03, 2020  Indication: Constipation   hydrOXYzine 25 MG tablet Commonly known as: ATARAX/VISTARIL Take 1 tablet (25 mg total) by mouth every 6 (six) hours as needed for anxiety. What changed:  medication strength how much to take how to take this when to take this reasons to take this  Indication: Feeling Anxious   melatonin 5 MG Tabs Take 1 tablet (5 mg total) by mouth at bedtime. For depression What changed:  medication strength how much to take when to take this reasons to take this additional instructions  Indication: Trouble Sleeping   mirtazapine 7.5 MG tablet Commonly known as: REMERON Take 1 tablet (7.5 mg total) by mouth at bedtime. For sleep What changed:  medication strength how much to take additional instructions Another medication with the same name was removed. Continue taking this medication, and follow the directions you see here.  Indication: Insomnia   polyethylene glycol 17 g packet Commonly known as: MIRALAX / GLYCOLAX Take 17 g by mouth daily. (May buy from over the counter): For constipation Start taking on: December 03, 2020  Indication: Constipation   risperiDONE 0.5 MG tablet Commonly known as: RISPERDAL Take 3 tablets (1.5 mg total) by mouth  at bedtime. For mood control  Indication: Mood control   sertraline 50 MG tablet Commonly known as: ZOLOFT Take 1 tablet (50 mg total) by mouth daily. For depression Start taking on: December 03, 2020  Indication: Major Depressive Disorder   traZODone 100 MG tablet Commonly known as: DESYREL Take 1 tablet (100 mg total) by mouth at bedtime. For sleep What changed: See the new instructions.  Indication: [place]. Go on 12/14/2020.   Specialty: Behavioral Health Why: You have an appointment for therapy services on 12/14/20 at 7:30 am. You may also go to this provider for medication management services as walk in on Monday or Tuesday from 7:45 am to 11:00 am.  These appointments will be held in person. Contact information: [place] Follow up on 12/03/2020.   Why: You have an appointment on 12/03/2020 at 10:00am. Contact information: Address: [address], [place] [address]   Phone: [phone]        [facility] Follow up.   Why: A referral has been made for you.  Please contact that agency for assistance with transportation, medications, meals, and other in home assistance. Contact information: Address: [address] [place], [place] [address]  Office: [phone] FAX: [phone]) [phone]-fax               Follow-up recommendations:  Activity:  As tolerated Diet: As recommended by your primary care doctor. Keep all scheduled follow-up appointments as recommended.  Comments: Prescriptions given at discharge.  Patient agreeable to plan.  Given opportunity to ask questions.  Appears to feel comfortable with discharge denies any current suicidal or homicidal thought. Patient is also instructed prior to discharge to: Take all medications as prescribed by his/her mental healthcare provider. Report any  adverse effects and or reactions from the medicines to his/her outpatient provider promptly. Patient has been instructed & cautioned: To not engage in alcohol and or illegal drug use while on prescription medicines. In the  event of worsening symptoms, patient is instructed to call the crisis hotline, 911 and or go to the nearest ED for appropriate evaluation and treatment of symptoms. To follow-up with his/her primary care provider for your other medical issues, concerns and or health care needs.  Signed: Lindell Spar, NP,pmhnp, fnp-bc 12/02/2020, 10:48 AM

## 2020-12-02 NOTE — Progress Notes (Signed)
Pt didn't attend group. 

## 2020-12-02 NOTE — BHH Suicide Risk Assessment (Signed)
[facility] Discharge Suicide Risk Assessment   Principal Problem: Severe major depression with psychotic features [facility]) Discharge Diagnoses: Principal Problem:   Severe major depression with psychotic features ([place]) Active Problems:   GAD (generalized anxiety disorder)   Total Time spent with patient: 20 minutes  Musculoskeletal: Strength & Muscle Tone: within normal limits Gait & Station: normal Patient leans: N/A  Psychiatric Specialty Exam  Presentation  General Appearance: Casual; Fairly Groomed  Eye Contact:Good  Speech:Clear and Coherent; Normal Rate  Speech Volume:Normal  Handedness:Right   Mood and Affect  Mood:Anxious  Duration of Depression Symptoms: Greater than two weeks  Affect:Constricted   Thought Process  Thought Processes:Coherent; Goal Directed  Descriptions of Associations:Intact  Orientation:Full (Time, Place and Person)  Thought Content:Logical  History of Schizophrenia/Schizoaffective disorder:No  Duration of Psychotic Symptoms:N/A  Hallucinations:Hallucinations: None  Ideas of Reference:None  Suicidal Thoughts:Suicidal Thoughts: Yes, Passive SI Passive Intent and/or Plan: Without Intent; Without Plan  Homicidal Thoughts:Homicidal Thoughts: No   Sensorium  Memory:Immediate Good; Recent Good  Judgment:Fair  Insight:Fair   Executive Functions  Concentration:Good  Attention Span:Good  Recall:Good  Fund of Knowledge:Good  Language:Good   Psychomotor Activity  Psychomotor Activity:Psychomotor Activity: Normal   Assets  Assets:Communication Skills; Desire for Improvement; Housing; Transportation; Vocational/Educational   Sleep  Sleep:Sleep: Fair Number of Hours of Sleep: 6.75   Physical Exam: Physical Exam Vitals and nursing note reviewed.  Constitutional:      General: He is not in acute distress.    Appearance: Normal appearance. He is not diaphoretic.  HENT:     Head: Normocephalic and atraumatic.   Cardiovascular:     Rate and Rhythm: Tachycardia present.  Pulmonary:     Effort: Pulmonary effort is normal.  Neurological:     General: No focal deficit present.     Mental Status: He is alert and oriented to person, place, and time.   Review of Systems  Constitutional: Negative.   HENT: Negative.    Respiratory: Negative.    Cardiovascular: Negative.   Gastrointestinal:  Negative for diarrhea, nausea and vomiting.  Musculoskeletal:  Positive for joint pain.       Positive for chronic bilateral hip pain; positive for chronic bilateral pain and stiffness of hands  Neurological: Negative.  Negative for dizziness, tremors, seizures and headaches.  Psychiatric/Behavioral:  Positive for depression. The patient is nervous/anxious.        Positive for chronic passive suicidal ideation  All other systems reviewed and are negative. Blood pressure (!) 137/91, pulse (!) 105, temperature 98.2 F (36.8 C), temperature source Oral, resp. rate 16, height '5\' 8"'$  (1.727 m), weight 79.8 kg, SpO2 97 %. Body mass index is 26.76 kg/m.  Mental Status Per Nursing Assessment::   On Admission:  Suicidal ideation indicated by patient, Plan includes specific time, place, or method, Intention to act on suicide plan, Self-harm thoughts, Belief that plan would result in death, Suicide plan, Self-harm behaviors  Demographic Factors:  Male and Living alone  Loss Factors: Financial problems/change in socioeconomic status  Historical Factors: Prior suicide attempts and Impulsivity  Risk Reduction Factors:   Employed, Positive therapeutic relationship, Access to social support, Future oriented outlook and Positive coping skills or problem solving skills  Continued Clinical Symptoms:  Anxiety -  Improved Depression  -  Improved  Cognitive Features That Contribute To Risk:  Thought constriction     Suicide Risk:  Mild acute risk:  Suicidal ideation of limited frequency, intensity, duration, and  specificity.  There are no identifiable  plans, no associated intent, mild dysphoria and related symptoms, good self-control (both objective and subjective assessment), few other risk factors, and identifiable protective factors, including available and accessible social support.   [place]. Go on 12/14/2020.   Specialty: Behavioral Health Why: You have an appointment for therapy services on 12/14/20 at 7:30 am. You may also go to this provider for medication management services as walk in on Monday or Tuesday from 7:45 am to 11:00 am.  These appointments will be held in person. Contact information: [place] Follow up on 12/03/2020.   Why: You have an appointment on 12/03/2020 at 10:00am. Contact information: Address: [address], [place] [address]   Phone: [phone]        [facility] Follow up.   Why: A referral has been made for you.  Please contact that agency for assistance with transportation, medications, meals, and other in home assistance. Contact information: Address: [address] [place], [place] [address]  Office: [phone] FAX: [phone]                Plan Of Care/Follow-up recommendations:  Activity:  as tolerated  Tests:  You will periodically need to have blood drawn for lab work to monitor your cholesterol and blood sugar while you are taking risperidone/Risperdal.  Your outpatient doctor will let you know when lab work needs to be performed.  Other:   -Take medications as prescribed.   -Do not drink alcohol.  Do not use marijuana/cannabis or other drugs.   -Keep outpatient mental health follow-up appointments with therapist and psychiatrist.   -Keep appointment with [facility] to obtain assistance with change in housing situation and to get connected with social and other groups.    -Contact [facility] to obtain additional assistance at home. -See your primary care provider for treatment of hypertension, elevated cholesterol and other medical conditions.     Arthor Captain, MD 12/02/2020, 8:57 AM

## 2020-12-02 NOTE — Progress Notes (Signed)
  D:  Pt reports high anxiety (8/10), stating "it's because I am leaving."  Pt reports depression (7/10).  Pt reports he will be discharged tomorrow to his apartment.  Pt states, "I am working towards getting in a group home."  Pt reports, "no social ties."  Pt states, "working with [employer] to get Medicare/Medicaid and Disability so I can get into a group home."  Pt admits to [place] thoughts.  Pt verbally contracts for safety.  Pt denies HI thoughts.  Pt reports AH, "crowd noises; like cheering."  A:  Labs/Vitals monitored; Medication administered; Pt encouraged to communicate concerns and provided reassurance.  R:  Pt remains safe on unit with Q 15 minutes safety checks.    12/01/20 2112  Psych Admission Type (Psych Patients Only)  Admission Status Voluntary  Psychosocial Assessment  Patient Complaints Anxiety;Depression  Eye Contact Brief  Facial Expression Anxious;Worried  Affect Sullen;Sad;Apprehensive  Speech Logical/coherent  Interaction Forwards little;Minimal;Guarded  Motor Activity Slow  Appearance/Hygiene Unremarkable  Behavior Characteristics Anxious  Mood Anxious;Depressed  Thought Process  Coherency Concrete thinking  Content WDL  Delusions None reported or observed  Perception Hallucinations  Hallucination Auditory (pt states, "crowd noise; like cheering")  Judgment Poor  Confusion None  Danger to Self  Current suicidal ideation? Passive (pt admits to SI thoughts)  Self-Injurious Behavior No self-injurious ideation or behavior indicators observed or expressed   Agreement Not to Harm Self Yes  Description of Agreement Pt verbally  contracts for safety  Danger to Others  Danger to Others None reported or observed

## 2020-12-02 NOTE — Progress Notes (Signed)
   12/02/20 0626  Vital Signs  Pulse Rate (!) 105  Pulse Rate Source Monitor  BP (!) 137/91  BP Location Right Arm  BP Method Automatic  Patient Position (if appropriate) Standing  Oxygen Therapy  SpO2 97 %  O2 Device Room Air   D:Patient admits to "passing" SI but does not have a plan. Patient stated that he wants to go home. Pt. Denies HI. Pt. Rated anxiety 9/10 and depression 8/10. Patient was sitting out in open areas.  A:  Patient took scheduled medicine.  Support and encouragement provided Routine safety checks conducted every 15 minutes. Patient  Informed to notify staff with any concerns.   R: Safety maintained.

## 2020-12-02 NOTE — Progress Notes (Signed)
  [facility] Adult Case Management Discharge Plan :  Will you be returning to the same living situation after discharge:  Yes,  Home  At discharge, do you have transportation home?: Yes,  Safe Transport  Do you have the ability to pay for your medications: Yes,  Community   Release of information consent forms completed and in the chart;  Patient's signature needed at discharge.  Patient to Follow up at:  [facility]. Go on 12/14/2020.   Specialty: Behavioral Health Why: You have an appointment for therapy services on 12/14/20 at 7:30 am. You may also go to this provider for medication management services as walk in on Monday or Tuesday from 7:45 am to 11:00 am.  These appointments will be held in person. Contact information: [facility] Follow up on 12/03/2020.   Why: You have an appointment on 12/03/2020 at 10:00am. Contact information: Address: [address], [place] [address]   Phone: [phone]        [facility] Follow up.   Why: A referral has been made for you.  Please contact that agency for assistance with transportation, medications, meals, and other in home assistance. Contact information: Address: [address] [place],  [address]  Office: [phone] FAX: [phone]-fax                Next level of care provider has access to [place] and Suicide Prevention discussed: Yes,  with patient      Has patient been referred to the Quitline?: N/A patient is not a smoker  Patient has been referred for addiction treatment: [place], [place] 12/02/2020, 9:35 AM

## 2020-12-02 NOTE — Final Progress Note (Signed)
Discharge Note:  Patient Casey Silva at this time, but admits to some passing SI thoughts. Discharge instructions, AVS, prescriptions and transition record gone over with patient. Patient agrees to comply with medication management, follow-up visit, and outpatient therapy. Patient belongings returned to patient. Patient questions and concerns addressed and answered.  Patient ambulatory off unit.  Patient discharged to home.

## 2020-12-02 NOTE — Plan of Care (Signed)
  Problem: Education: Goal: Knowledge of the prescribed therapeutic regimen will improve Outcome: Progressing   Problem: Coping: Goal: Coping ability will improve Outcome: Progressing   Problem: Activity: Goal: Interest or engagement in leisure activities will improve Outcome: Not Progressing

## 2020-12-03 ENCOUNTER — Telehealth (HOSPITAL_COMMUNITY): Payer: Self-pay | Admitting: Psychiatry

## 2020-12-03 ENCOUNTER — Other Ambulatory Visit: Payer: Self-pay

## 2020-12-03 NOTE — Telephone Encounter (Signed)
Milus Banister, from [facility], was returning a call to provider about this patient. Stated that he received a call from provider a few days back and was returning that call to follow-up about this patient. No phone number was left. Caller ID listed "[place]. [phone]"

## 2020-12-03 NOTE — Telephone Encounter (Signed)
Patient boss Casey Silva from [employer] notes that he wold like to fax FMLA paperwork over so that patient could be paid for his time off. Fax number [phone]) given to patients boss. No other concerns noted at this time.

## 2020-12-09 ENCOUNTER — Other Ambulatory Visit: Payer: Self-pay

## 2020-12-14 NOTE — Telephone Encounter (Signed)
Milus Banister from [facility] reports faxing an "additional" form on 12/09/20 asking for more specific information regarding return to work date and/or restrictions. Please update.

## 2020-12-16 NOTE — Telephone Encounter (Signed)
Provider spoke to patient and he notes that he is feeling somewhat better since being released from the hospital.  He notes that he still has days where he feels depressed however denies suicidal ideation today.  He informed Probation officer that he is ready to go back to work and notes that he feels that he does not require any restrictions.  Provider filled out patient's FMLA forms and fax them over to his place of employment. Patient will return to work on 12/20/2020. No other concerns at this time.

## 2020-12-28 ENCOUNTER — Ambulatory Visit (INDEPENDENT_AMBULATORY_CARE_PROVIDER_SITE_OTHER): Payer: No Payment, Other | Admitting: Psychiatry

## 2020-12-28 ENCOUNTER — Telehealth (HOSPITAL_COMMUNITY): Payer: Self-pay | Admitting: Psychiatry

## 2020-12-28 ENCOUNTER — Other Ambulatory Visit: Payer: Self-pay

## 2020-12-28 ENCOUNTER — Encounter (HOSPITAL_COMMUNITY): Payer: Self-pay | Admitting: Psychiatry

## 2020-12-28 VITALS — BP 148/96 | HR 72 | Ht 68.0 in | Wt 164.0 lb

## 2020-12-28 DIAGNOSIS — F332 Major depressive disorder, recurrent severe without psychotic features: Secondary | ICD-10-CM | POA: Diagnosis not present

## 2020-12-28 DIAGNOSIS — F411 Generalized anxiety disorder: Secondary | ICD-10-CM | POA: Diagnosis not present

## 2020-12-28 MED ORDER — MELATONIN 5 MG PO TABS
5.0000 mg | ORAL_TABLET | Freq: Every day | ORAL | 3 refills | Status: DC
  Filled 2020-12-28: qty 30, 30d supply, fill #0

## 2020-12-28 MED ORDER — HYDROXYZINE HCL 25 MG PO TABS
25.0000 mg | ORAL_TABLET | Freq: Four times a day (QID) | ORAL | 3 refills | Status: DC | PRN
Start: 2020-12-28 — End: 2021-03-30
  Filled 2020-12-28: qty 75, 18d supply, fill #0
  Filled 2021-02-09: qty 75, 18d supply, fill #1
  Filled 2021-03-10: qty 75, 18d supply, fill #2

## 2020-12-28 MED ORDER — TRAZODONE HCL 100 MG PO TABS
100.0000 mg | ORAL_TABLET | Freq: Every day | ORAL | 3 refills | Status: DC
Start: 2020-12-28 — End: 2021-03-30
  Filled 2020-12-28 – 2021-01-07 (×2): qty 30, 30d supply, fill #0
  Filled 2021-02-09: qty 30, 30d supply, fill #1
  Filled 2021-03-10: qty 30, 30d supply, fill #2

## 2020-12-28 MED ORDER — RISPERIDONE 0.5 MG PO TABS
1.5000 mg | ORAL_TABLET | Freq: Every day | ORAL | 3 refills | Status: DC
  Filled 2020-12-28: qty 90, 30d supply, fill #0
  Filled 2021-02-09: qty 90, 30d supply, fill #1
  Filled 2021-03-10: qty 90, 30d supply, fill #2

## 2020-12-28 MED ORDER — SERTRALINE HCL 50 MG PO TABS
50.0000 mg | ORAL_TABLET | Freq: Every day | ORAL | 3 refills | Status: DC
Start: 2020-12-28 — End: 2021-03-30
  Filled 2020-12-28 – 2021-01-07 (×2): qty 30, 30d supply, fill #0

## 2020-12-28 MED ORDER — MIRTAZAPINE 7.5 MG PO TABS
7.5000 mg | ORAL_TABLET | Freq: Every day | ORAL | 3 refills | Status: DC
Start: 2020-12-28 — End: 2021-03-30
  Filled 2020-12-28 – 2021-01-07 (×2): qty 30, 30d supply, fill #0
  Filled 2021-02-09: qty 30, 30d supply, fill #1
  Filled 2021-03-10: qty 30, 30d supply, fill #2

## 2020-12-28 NOTE — Telephone Encounter (Signed)
Patient called requesting paperwork to be sent to workplace regarding disability.

## 2020-12-28 NOTE — Telephone Encounter (Signed)
Patient informed writer that since his hospitalization he still feels that he has a mental decline.  He notes that he would like to take some time off from work to focus on his mental health.  He request that provider send a letter to his employer stating this.  Provider was agreeable to this and sent it to [place].  Patient employer notes that they will begin the process of FMLA and reports that they may be faxing more forms over to [facility] behavioral health.  No other concerns at this time.

## 2020-12-28 NOTE — Progress Notes (Signed)
BH MD/PA/NP OP Progress Note  12/28/2020 1:04 PM Casey Silva  MRN:  [id number]  Chief Complaint:   "I feel about the same since my hospitalization" Chief Complaint   Medication Management     HPI:  A 65 year old male seen today for follow up psychiatric evaluation. He has a psychiatric history of brief psychotic disorder, MDD, and insomnia.  He was recently hospitalized on 11/23/2020 to 12/02/2020 presenting with worsening depression and suicidal ideation.  His medications were readjusted and he is currently on hydroxyzine 25 mg every 6 hours, melatonin 5 mg nightly as needed (patient notes that he has been taking 10 mg) mirtazapine 7.5 mg nightly Risperdal 1.5 mg nightly, Zoloft 50 mg daily, and trazodone 100 mg nightly as needed.  He notes that his medications are somewhat effective in managing his psychiatric condition.    Today he is well-groomed, pleasant, cooperative, engaged in conversation, and maintained eye contact.  Patient informed writer that he feels depressed most days and notes that nothing much is changed since his hospitalization.  He notes that he has lost 20 to 25 pounds because of poor appetite and reports that his sleep fluctuates noting that at times he gets 6 or less hours nightly.  He notes that his anxiety and depression still are bothersome however notes that he does not want his medications adjusted.  Provider conducted a GAD-7 and patient scored an 18, at his last visit he scored a 22.  Provider also conducted a PHQ-9 and patient scored 24, at his last visit he scored a 27.  Patient notes that work is stressful (patient still works as a Quarry manager).  Provider asked patient if he considered potentially taking some time off and recommended short-term disability if things become too overwhelming.  He notes that he will consider it in the future.  Patient notes that his living situation continues to be detrimental to his health and reports that this is a source of his stressors.  Patient  given the number to envisions of life act team.  At this time no medication changes made.  Provider recommended patient follow-up with a therapist however he notes that he is not interested at this time.  No other concerns noted at this time.   Visit Diagnosis:    ICD-10-CM   1. Severe episode of recurrent major depressive disorder, without psychotic features (HCC)  F33.2 melatonin 5 MG TABS    mirtazapine (REMERON) 7.5 MG tablet    risperiDONE (RISPERDAL) 0.5 MG tablet    sertraline (ZOLOFT) 50 MG tablet    traZODone (DESYREL) 100 MG tablet    2. GAD (generalized anxiety disorder)  F41.1 hydrOXYzine (ATARAX/VISTARIL) 25 MG tablet    mirtazapine (REMERON) 7.5 MG tablet    sertraline (ZOLOFT) 50 MG tablet    traZODone (DESYREL) 100 MG tablet      Past Psychiatric History: brief psychotic disorder, MDD, and insomnia  Past Medical History:  Past Medical History:  Diagnosis Date   Anxiety    Depression    No past surgical history on file.  Family Psychiatric History: Mother bipolar depression   Family History:  Family History  Problem Relation Age of Onset   Bipolar disorder Mother     Social History:  Social History   Socioeconomic History   Marital status: Single    Spouse name: Not on file   Number of children: 1   Years of education: Not on file   Highest education level: GED or equivalent  Occupational History  Not on file  Tobacco Use   Smoking status: Never   Smokeless tobacco: Never  Vaping Use   Vaping Use: Unknown  Substance and Sexual Activity   Alcohol use: No   Drug use: No   Sexual activity: Not Currently  Other Topics Concern   Not on file  Social History Narrative   Marital status:  Single      Children: none      Employment:  Unemployment.  Independently wealthy; previous work for [employer].      Tobacco:  1-2 cigarettes per day since age 67.      Alcohol:  None      Drugs:  none      Exercise:  Regularly very      Religion:   Jewish   Social Determinants of Radio broadcast assistant Strain: Not on file  Food Insecurity: Not on file  Transportation Needs: Not on file  Physical Activity: Not on file  Stress: Not on file  Social Connections: Not on file    Allergies:  Allergies  Allergen Reactions   Dust Mite Mixed Allergen Ext [Mite (D. Farinae)] Shortness Of Breath   Bee Pollen Other (See Comments)    Watery Eyes    Metabolic Disorder Labs: Lab Results  Component Value Date   HGBA1C 5.5 11/24/2020   MPG 111.15 11/24/2020   No results found for: PROLACTIN Lab Results  Component Value Date   CHOL 237 (H) 11/24/2020   TRIG 192 (H) 11/24/2020   HDL 51 11/24/2020   CHOLHDL 4.6 11/24/2020   VLDL 38 11/24/2020   LDLCALC 148 (H) 11/24/2020   Lab Results  Component Value Date   TSH 2.450 11/24/2020   TSH 1.267 09/24/2019    Therapeutic Level Labs: No results found for: LITHIUM No results found for: VALPROATE No components found for:  CBMZ  Current Medications: Current Outpatient Medications  Medication Sig Dispense Refill   amLODipine (NORVASC) 5 MG tablet Take 1 tablet (5 mg total) by mouth daily. For high blood pressure 30 tablet 0   docusate sodium (COLACE) 100 MG capsule Take 1 capsule (100 mg total) by mouth daily. (May buy from over the counter): For constipation 1 capsule 0   polyethylene glycol (MIRALAX / GLYCOLAX) 17 g packet Take 17 g by mouth daily. (May buy from over the counter): For constipation 1 each 0   hydrOXYzine (ATARAX/VISTARIL) 25 MG tablet Take 1 tablet (25 mg total) by mouth every 6 (six) hours as needed for anxiety. 75 tablet 3   melatonin 5 MG TABS Take 1 tablet (5 mg total) by mouth at bedtime. For depression 30 tablet 3   mirtazapine (REMERON) 7.5 MG tablet Take 1 tablet (7.5 mg total) by mouth at bedtime. 30 tablet 3   risperiDONE (RISPERDAL) 0.5 MG tablet Take 3 tablets (1.5 mg total) by mouth at bedtime. For mood control 90 tablet 3   sertraline (ZOLOFT) 50  MG tablet Take 1 tablet (50 mg total) by mouth daily. For depression 30 tablet 3   traZODone (DESYREL) 100 MG tablet Take 1 tablet (100 mg total) by mouth at bedtime. For sleep 30 tablet 3   No current facility-administered medications for this visit.     Musculoskeletal: Strength & Muscle Tone: within normal limits Gait & Station: normal Patient leans: N/A  Psychiatric Specialty Exam: Review of Systems  Blood pressure (!) 148/96, pulse 72, height 5\' 8"  (1.727 m), weight 164 lb (74.4 kg).Body mass index is 24.94  kg/m.  General Appearance: Well Groomed  Eye Contact:  Good  Speech:  Clear and Coherent and Normal Rate  Volume:  Normal  Mood:  Anxious and Depressed  Affect:  Appropriate and Congruent  Thought Process:  Coherent, Goal Directed, and Linear  Orientation:  Full (Time, Place, and Person)  Thought Content: WDL and Logical   Suicidal Thoughts:  Yes.  without intent/plan  Homicidal Thoughts:  No  Memory:  Immediate;   Good Recent;   Good Remote;   Good  Judgement:  Good  Insight:  Good  Psychomotor Activity:  Normal  Concentration:  Concentration: Good and Attention Span: Good  Recall:  Good  Fund of Knowledge: Good  Language: Good  Akathisia:  No  Handed:  Right  AIMS (if indicated): not done  Assets:  Communication Skills Desire for Improvement Financial Resources/Insurance Housing Physical Health Talents/Skills  ADL's:  Intact  Cognition: WNL  Sleep:  Fair   Screenings: AIMS    Flowsheet Row Admission (Discharged) from 11/23/2020 in [facility]  AIMS Total Score 0      AUDIT    Flowsheet Row Admission (Discharged) from 11/23/2020 in [facility] Admission (Discharged) from 09/24/2019 in [facility]  Alcohol Use Disorder Identification Test Final Score (AUDIT) 0 0      GAD-7    Flowsheet Row Clinical Support from 12/28/2020 in [facility] from 11/22/2020 in [facility] Video Visit from 09/20/2020 in [facility] Video Visit from 06/23/2020 in [facility] from 03/25/2020 in [facility]  Total GAD-7 Score 18 20 7 8 13       PHQ2-9    [place] from 12/28/2020 in [facility] Most recent reading at 12/28/2020 11:45 AM ED from 11/22/2020 in [facility] Most recent reading at 11/22/2020  9:59 PM Clinical Support from 11/22/2020 in [facility] Most recent reading at 11/22/2020  5:13 PM Video Visit from 09/20/2020 in [facility] Most recent reading at 09/20/2020 11:58 AM Video Visit from 06/23/2020 in [facility] Most recent reading at 06/23/2020  9:56 AM  PHQ-2 Total Score 6 6 6 5 5   PHQ-9 Total Score 24 27 27 17 18       Flowsheet Row Clinical Support from 12/28/2020 in [facility] Admission (Discharged) from 11/23/2020 in [facility] ED from 11/22/2020 in [facility] Error: Q7 should not be populated when Q6 is No High Risk High Risk        Assessment and Plan: Patient endorses symptoms of anxiety, depression, and poor sleep.  At this time patient request that his medications not be adjusted.  He will continue all meds as prescribed.  1. Severe episode of recurrent major depressive disorder, without psychotic features ([place])  Continue- melatonin 5 MG TABS; Take 1 tablet (5 mg total) by mouth at bedtime. For depression  Dispense: 30 tablet; Refill: 3 Continue- mirtazapine (REMERON) 7.5 MG tablet; Take 1 tablet (7.5 mg total) by mouth at bedtime.  Dispense: 30 tablet; Refill: 3 Continue-  risperiDONE (RISPERDAL) 0.5 MG tablet; Take 3 tablets (1.5 mg total) by mouth at bedtime. For mood control  Dispense: 90 tablet; Refill: 3 Continue- sertraline (ZOLOFT)  50 MG tablet; Take 1 tablet (50 mg total) by mouth daily. For depression  Dispense: 30 tablet; Refill: 3 Continue- traZODone (DESYREL) 100 MG tablet; Take 1 tablet (100 mg total) by mouth at bedtime. For sleep  Dispense: 30 tablet; Refill: 3  2. GAD (generalized anxiety disorder)  Continue- hydrOXYzine (ATARAX/VISTARIL) 25 MG tablet; Take 1 tablet (25 mg total) by mouth every 6 (six) hours as needed for anxiety.  Dispense: 75 tablet; Refill: 3 Continue- mirtazapine (REMERON) 7.5 MG tablet; Take 1 tablet (7.5 mg total) by mouth at bedtime.  Dispense: 30 tablet; Refill: 3 Continue- sertraline (ZOLOFT) 50 MG tablet; Take 1 tablet (50 mg total) by mouth daily. For depression  Dispense: 30 tablet; Refill: 3 Continue- traZODone (DESYREL) 100 MG tablet; Take 1 tablet (100 mg total) by mouth at bedtime. For sleep  Dispense: 30 tablet; Refill: 3  Follow-up in 3 months   Salley Slaughter, NP 12/28/2020, 1:04 PM

## 2020-12-30 ENCOUNTER — Other Ambulatory Visit: Payer: Self-pay

## 2021-01-06 ENCOUNTER — Other Ambulatory Visit: Payer: Self-pay

## 2021-01-07 ENCOUNTER — Other Ambulatory Visit: Payer: Self-pay

## 2021-01-07 ENCOUNTER — Other Ambulatory Visit (HOSPITAL_COMMUNITY): Payer: Self-pay | Admitting: Psychiatry

## 2021-01-10 ENCOUNTER — Other Ambulatory Visit: Payer: Self-pay

## 2021-02-09 ENCOUNTER — Other Ambulatory Visit: Payer: Self-pay

## 2021-02-10 ENCOUNTER — Other Ambulatory Visit: Payer: Self-pay

## 2021-02-11 ENCOUNTER — Other Ambulatory Visit: Payer: Self-pay

## 2021-03-10 ENCOUNTER — Other Ambulatory Visit: Payer: Self-pay

## 2021-03-11 ENCOUNTER — Other Ambulatory Visit: Payer: Self-pay

## 2021-03-30 ENCOUNTER — Other Ambulatory Visit: Payer: Self-pay

## 2021-03-30 ENCOUNTER — Telehealth (INDEPENDENT_AMBULATORY_CARE_PROVIDER_SITE_OTHER): Payer: No Payment, Other | Admitting: Psychiatry

## 2021-03-30 ENCOUNTER — Encounter (HOSPITAL_COMMUNITY): Payer: Self-pay | Admitting: Psychiatry

## 2021-03-30 DIAGNOSIS — F411 Generalized anxiety disorder: Secondary | ICD-10-CM

## 2021-03-30 DIAGNOSIS — F332 Major depressive disorder, recurrent severe without psychotic features: Secondary | ICD-10-CM | POA: Diagnosis not present

## 2021-03-30 MED ORDER — TRAZODONE HCL 100 MG PO TABS
100.0000 mg | ORAL_TABLET | Freq: Every day | ORAL | 3 refills | Status: DC
Start: 2021-03-30 — End: 2021-04-27
  Filled 2021-03-30 – 2021-04-05 (×2): qty 30, 30d supply, fill #0

## 2021-03-30 MED ORDER — MIRTAZAPINE 7.5 MG PO TABS
7.5000 mg | ORAL_TABLET | Freq: Every day | ORAL | 3 refills | Status: DC
  Filled 2021-03-30 – 2021-05-05 (×3): qty 30, 30d supply, fill #0

## 2021-03-30 MED ORDER — RISPERIDONE 0.5 MG PO TABS
1.5000 mg | ORAL_TABLET | Freq: Every day | ORAL | 3 refills | Status: DC
  Filled 2021-03-30 – 2021-04-05 (×2): qty 90, 30d supply, fill #0

## 2021-03-30 MED ORDER — HYDROXYZINE HCL 50 MG PO TABS
50.0000 mg | ORAL_TABLET | Freq: Four times a day (QID) | ORAL | 3 refills | Status: DC | PRN
  Filled 2021-03-30 – 2021-05-05 (×2): qty 90, 23d supply, fill #0

## 2021-03-30 MED ORDER — MELATONIN 5 MG PO TABS
5.0000 mg | ORAL_TABLET | Freq: Every day | ORAL | 3 refills | Status: DC
  Filled 2021-03-30: qty 30, 30d supply, fill #0

## 2021-03-30 NOTE — Progress Notes (Signed)
[facility] MD/PA/NP OP Progress Note Virtual Visit via Video Note  I connected with Casey Silva on 03/30/21 at  2:00 PM EST by a video enabled telemedicine application and verified that I am speaking with the correct person using two identifiers.  Location: Patient: Home Provider: Clinic   I discussed the limitations of evaluation and management by telemedicine and the availability of in person appointments. The patient expressed understanding and agreed to proceed.  I provided 30 minutes of non-face-to-face time during this encounter.   03/30/2021 2:17 PM Casey Silva  MRN:  [id number]  Chief Complaint:   "Things are still not improving"    HPI:  A 65 year old male seen today for follow up psychiatric evaluation. He has a psychiatric history of brief psychotic disorder, MDD, and insomnia. He is currently on hydroxyzine 25 mg every 6 hours, melatonin 5 mg nightly as needed (patient notes that he has been taking 10 mg at times) mirtazapine 7.5 mg nightly, Risperdal 1.5 mg nightly, Zoloft 50 mg daily, and trazodone 100 mg nightly as needed.  Patient informed Probation officer that he no longer takes Zoloft and notes that his medications are somewhat effective in managing his psychiatric condition.    Today he is well-groomed, pleasant, cooperative, engaged in conversation, and maintained eye contact.  Patient informed Probation officer that things has not been improving.  He notes that he still has difficulties with activities of daily living.  He informed Probation officer that he continues to be on short-term leave from work and is trying to get himself together mentally.  He informed Probation officer that he is uncertain when he will return back to work.  Patient notes that time he continues to be anxious about his future, finances, work, and his health.  Provider conducted a GAD-7 and patient scored a 14, at his last visit he scored a 77.  Provider also conducted PHQ-9 and patient scored 20, at last visit he scored a 24.  He endorses passive  SI however notes that he does not want to harm himself.  He denies SI/HI/VAH, mania, or paranoia.  Patient notes that he has difficulty falling asleep but reports that he sleeps approximately 7 hours nightly.  He endorses having a good appetite.  Patient informed Probation officer that he finds hydroxyzine effective and requested that it be increased.  Today hydroxyzine increased from 25 mg 3 times a day to 50 mg 3 times a day.  Provider suggested increasing mirtazapine however he notes that he does not want to increase.  He will continue all other medications as prescribed.  No other concerns at this time.     Visit Diagnosis:    ICD-10-CM   1. GAD (generalized anxiety disorder)  F41.1 hydrOXYzine (ATARAX) 50 MG tablet    mirtazapine (REMERON) 7.5 MG tablet    traZODone (DESYREL) 100 MG tablet    2. Severe episode of recurrent major depressive disorder, without psychotic features ([place])  F33.2 melatonin 5 MG TABS    mirtazapine (REMERON) 7.5 MG tablet    risperiDONE (RISPERDAL) 0.5 MG tablet    traZODone (DESYREL) 100 MG tablet      Past Psychiatric History: brief psychotic disorder, MDD, and insomnia  Past Medical History:  Past Medical History:  Diagnosis Date   Anxiety    Depression    History reviewed. No pertinent surgical history.  Family Psychiatric History: Mother bipolar depression   Family History:  Family History  Problem Relation Age of Onset   Bipolar disorder Mother     Social History:  Social History   Socioeconomic History   Marital status: Single    Spouse name: Not on file   Number of children: 1   Years of education: Not on file   Highest education level: GED or equivalent  Occupational History   Not on file  Tobacco Use   Smoking status: Never   Smokeless tobacco: Never  Vaping Use   Vaping Use: Unknown  Substance and Sexual Activity   Alcohol use: No   Drug use: No   Sexual activity: Not Currently  Other Topics Concern   Not on file  Social History  Narrative   Marital status:  Single      Children: none      Employment:  Unemployment.  Independently wealthy; previous work for [employer].      Tobacco:  1-2 cigarettes per day since age 10.      Alcohol:  None      Drugs:  none      Exercise:  Regularly very      Religion:  Jewish   Social Determinants of Radio broadcast assistant Strain: Not on file  Food Insecurity: Not on file  Transportation Needs: Not on file  Physical Activity: Not on file  Stress: Not on file  Social Connections: Not on file    Allergies:  Allergies  Allergen Reactions   Dust Mite Mixed Allergen Ext [Mite (D. Farinae)] Shortness Of Breath   Bee Pollen Other (See Comments)    Watery Eyes    Metabolic Disorder Labs: Lab Results  Component Value Date   HGBA1C 5.5 11/24/2020   MPG 111.15 11/24/2020   No results found for: PROLACTIN Lab Results  Component Value Date   CHOL 237 (H) 11/24/2020   TRIG 192 (H) 11/24/2020   HDL 51 11/24/2020   CHOLHDL 4.6 11/24/2020   VLDL 38 11/24/2020   LDLCALC 148 (H) 11/24/2020   Lab Results  Component Value Date   TSH 2.450 11/24/2020   TSH 1.267 09/24/2019    Therapeutic Level Labs: No results found for: LITHIUM No results found for: VALPROATE No components found for:  CBMZ  Current Medications: Current Outpatient Medications  Medication Sig Dispense Refill   amLODipine (NORVASC) 5 MG tablet Take 1 tablet (5 mg total) by mouth daily. For high blood pressure 30 tablet 0   docusate sodium (COLACE) 100 MG capsule Take 1 capsule (100 mg total) by mouth daily. (May buy from over the counter): For constipation 1 capsule 0   hydrOXYzine (ATARAX) 50 MG tablet Take 1 tablet (50 mg total) by mouth every 6 (six) hours as needed for anxiety. 90 tablet 3   melatonin 5 MG TABS Take 1 tablet (5 mg total) by mouth at bedtime. For depression 30 tablet 3   mirtazapine (REMERON) 7.5 MG tablet Take 1 tablet (7.5 mg total) by mouth at bedtime. 30 tablet 3    polyethylene glycol (MIRALAX / GLYCOLAX) 17 g packet Take 17 g by mouth daily. (May buy from over the counter): For constipation 1 each 0   risperiDONE (RISPERDAL) 0.5 MG tablet Take 3 tablets (1.5 mg total) by mouth at bedtime. For mood control 90 tablet 3   traZODone (DESYREL) 100 MG tablet Take 1 tablet (100 mg total) by mouth at bedtime. For sleep 30 tablet 3   No current facility-administered medications for this visit.     Musculoskeletal: Strength & Muscle Tone:  Unable to assess due to telehealth visit Gait & Station:  Unable  to assess due to telehealth visit Patient leans: N/A  Psychiatric Specialty Exam: Review of Systems  There were no vitals taken for this visit.There is no height or weight on file to calculate BMI.  General Appearance:  Unable to assess due to telehealth visit  Eye Contact:  Good  Speech:  Clear and Coherent and Normal Rate  Volume:  Normal  Mood:  Anxious and Depressed  Affect:  Appropriate and Congruent  Thought Process:  Coherent, Goal Directed, and Linear  Orientation:  Full (Time, Place, and Person)  Thought Content: WDL and Logical   Suicidal Thoughts:  Yes.  without intent/plan  Homicidal Thoughts:  No  Memory:  Immediate;   Good Recent;   Good Remote;   Good  Judgement:  Good  Insight:  Good  Psychomotor Activity:  Normal  Concentration:  Concentration: Good and Attention Span: Good  Recall:  Good  Fund of Knowledge: Good  Language: Good  Akathisia:  No  Handed:  Right  AIMS (if indicated): not done  Assets:  Communication Skills Desire for Improvement Financial Resources/Insurance Housing Physical Health Talents/Skills  ADL's:  Intact  Cognition: WNL  Sleep:  Good   Screenings: AIMS    Flowsheet Row Admission (Discharged) from 11/23/2020 in [facility]  AIMS Total Score 0      AUDIT    Flowsheet Row Admission (Discharged) from 11/23/2020 in [facility]  Admission (Discharged) from 09/24/2019 in [place] [facility] [facility]  Alcohol Use Disorder Identification Test Final Score (AUDIT) 0 0      GAD-7    Flowsheet Row Video Visit from 03/30/2021 in [facility] [place] from 12/28/2020 in [place] from 11/22/2020 in [facility] Video Visit from 09/20/2020 in [facility] Video Visit from 06/23/2020 in [facility]  Total GAD-7 Score 14 18 20 7 8       PHQ2-9    Flowsheet Row Video Visit from 03/30/2021 in [facility] Most recent reading at 03/30/2021  2:06 PM Clinical Support from 12/28/2020 in [facility] Most recent reading at 12/28/2020 11:45 AM ED from 11/22/2020 in [facility] Most recent reading at 11/22/2020  9:59 PM Clinical Support from 11/22/2020 in [facility] Most recent reading at 11/22/2020  5:13 PM Video Visit from 09/20/2020 in [facility] Most recent reading at 09/20/2020 11:58 AM  PHQ-2 Total Score 5 6 6 6 5   PHQ-9 Total Score 20 24 27 27 17       Flowsheet Row Video Visit from 03/30/2021 in [place] from 12/28/2020 in [facility] Admission (Discharged) from 11/23/2020 in [facility]  C-SSRS RISK CATEGORY Error: Q7 should not be populated when Q6 is No Error: Q7 should not be populated when Q6 is No High Risk        Assessment and Plan: Patient endorses symptoms of anxiety, depression, difficulty falling asleep.  Patient informed Probation officer that he finds hydroxyzine effective and requested that it be increased.  Today hydroxyzine increased from 25 mg 3 times a day to 50 mg 4 times a day.  Provider  suggested increasing mirtazapine however he notes that he does not want to increase.  He will continue all other medications as prescribe  1.  Severe episode of recurrent major depressive disorder, without psychotic features ([place])  Continue- melatonin 5 MG TABS; Take 1 tablet (5 mg total) by mouth at bedtime. For depression  Dispense: 30 tablet; Refill: 3 Continue- mirtazapine (REMERON) 7.5 MG tablet; Take 1 tablet (7.5 mg total) by mouth at bedtime.  Dispense: 30 tablet; Refill: 3 Continue- risperiDONE (RISPERDAL) 0.5 MG tablet; Take 3 tablets (1.5 mg total) by mouth at bedtime. For mood control  Dispense: 90 tablet; Refill: 3 Continue- traZODone (DESYREL) 100 MG tablet; Take 1 tablet (100 mg total) by mouth at bedtime. For sleep  Dispense: 30 tablet; Refill: 3  2. GAD (generalized anxiety disorder)  Continue- hydrOXYzine (ATARAX/VISTARIL) 50 MG tablet; Take 1 tablet (50 mg total) by mouth every 6 (six) hours as needed for anxiety.  Dispense: 75 tablet; Refill: 3 Continue- mirtazapine (REMERON) 7.5 MG tablet; Take 1 tablet (7.5 mg total) by mouth at bedtime.  Dispense: 30 tablet; Refill: 3 Continue- traZODone (DESYREL) 100 MG tablet; Take 1 tablet (100 mg total) by mouth at bedtime. For sleep  Dispense: 30 tablet; Refill: 3  Follow-up in 3 months   Salley Slaughter, NP 03/30/2021, 2:17 PM

## 2021-04-05 ENCOUNTER — Other Ambulatory Visit: Payer: Self-pay

## 2021-04-06 ENCOUNTER — Other Ambulatory Visit: Payer: Self-pay

## 2021-04-27 ENCOUNTER — Other Ambulatory Visit (HOSPITAL_COMMUNITY): Payer: Self-pay | Admitting: Psychiatry

## 2021-04-27 ENCOUNTER — Telehealth (HOSPITAL_COMMUNITY): Payer: Self-pay | Admitting: *Deleted

## 2021-04-27 ENCOUNTER — Other Ambulatory Visit: Payer: Self-pay

## 2021-04-27 DIAGNOSIS — F411 Generalized anxiety disorder: Secondary | ICD-10-CM

## 2021-04-27 DIAGNOSIS — F332 Major depressive disorder, recurrent severe without psychotic features: Secondary | ICD-10-CM

## 2021-04-27 MED ORDER — TRAZODONE HCL 100 MG PO TABS
200.0000 mg | ORAL_TABLET | Freq: Every day | ORAL | 3 refills | Status: DC
  Filled 2021-04-27: qty 60, 30d supply, fill #0

## 2021-04-27 MED ORDER — RISPERIDONE 2 MG PO TABS
2.0000 mg | ORAL_TABLET | Freq: Every day | ORAL | 3 refills | Status: DC
  Filled 2021-04-27: qty 30, 30d supply, fill #0

## 2021-04-27 NOTE — Telephone Encounter (Signed)
Call from patient wanting to speak with Dr Ronne Binning, states he is "doing worse". Asked him in which way he is doing worse, states his anxiety is really back. He states shen asked if he is having thoughts to hurt self or others that he was having thoughts to hurt self, asked if he had a plan, said he has a knife. Asked him more questions but didn't answer them but said again he wanted to speak with his Dr. Aletha Halim notify Dr Ronne Binning now.

## 2021-04-27 NOTE — Telephone Encounter (Signed)
Notified mobile crisis team to go out and assess him for hospitalization. First they contact him via phone, he has to consent to be seen. She will call me back after she speaks with him. I called his provider as well and she was calling him as soon as we hung up and agreed with calling mobile crisis.

## 2021-04-27 NOTE — Telephone Encounter (Signed)
Provider did inform patient that he can come into the clinic if in crisis.  He endorsed understanding and agreed.

## 2021-04-27 NOTE — Telephone Encounter (Signed)
Call back from [place] with Mobile Crisis stating patient declined a visit, said he didn't feel he needed it because he had just spoken with the Dr and she was increasing one of his medicines and he thought that would help so he didn't see a need for a visit. He denied to [place] andy thoughts or plans for SI or HI. Will notify Dr Ronne Binning.

## 2021-04-27 NOTE — Telephone Encounter (Signed)
Writer called patient who notes that he has been more anxious and depressed.  He reports that he is only sleeping 3 to 4 hours nightly.  When writer asked patient if he was suicidal he notes that he does not want to harm himself today however reports that he does have passive SI.  At this time Risperdal 1.5 mg increased to 2 mg to help with symptoms of depression and sleep.  Trazodone 100 mg increased to 200 mg to help with anxiety, depression, and sleep.  Nursing staff instructed to call mobile crisis.  Patient informed that mobile crisis will be coming to do a wellness check.  He endorsed understanding and agreed.  No other concerns at this time.

## 2021-04-28 ENCOUNTER — Other Ambulatory Visit: Payer: Self-pay

## 2021-05-05 ENCOUNTER — Other Ambulatory Visit: Payer: Self-pay

## 2021-05-06 ENCOUNTER — Other Ambulatory Visit: Payer: Self-pay

## 2021-05-16 ENCOUNTER — Other Ambulatory Visit: Payer: Self-pay

## 2021-05-16 ENCOUNTER — Telehealth (HOSPITAL_COMMUNITY): Payer: Self-pay | Admitting: Psychiatry

## 2021-05-16 ENCOUNTER — Other Ambulatory Visit (HOSPITAL_COMMUNITY): Payer: Self-pay | Admitting: Psychiatry

## 2021-05-16 ENCOUNTER — Telehealth (HOSPITAL_COMMUNITY): Payer: Self-pay | Admitting: *Deleted

## 2021-05-16 DIAGNOSIS — F332 Major depressive disorder, recurrent severe without psychotic features: Secondary | ICD-10-CM

## 2021-05-16 DIAGNOSIS — F411 Generalized anxiety disorder: Secondary | ICD-10-CM

## 2021-05-16 MED ORDER — MELATONIN 5 MG PO TABS
5.0000 mg | ORAL_TABLET | Freq: Every day | ORAL | 3 refills | Status: DC
  Filled 2021-05-16: qty 30, 30d supply, fill #0

## 2021-05-16 MED ORDER — HYDROXYZINE HCL 50 MG PO TABS
50.0000 mg | ORAL_TABLET | Freq: Four times a day (QID) | ORAL | 3 refills | Status: DC | PRN
  Filled 2021-05-16 – 2021-05-24 (×2): qty 90, 23d supply, fill #0

## 2021-05-16 MED ORDER — TRAZODONE HCL 100 MG PO TABS
200.0000 mg | ORAL_TABLET | Freq: Every day | ORAL | 3 refills | Status: DC
  Filled 2021-05-16 – 2021-05-24 (×2): qty 60, 30d supply, fill #0

## 2021-05-16 MED ORDER — MIRTAZAPINE 30 MG PO TABS
30.0000 mg | ORAL_TABLET | Freq: Every day | ORAL | 3 refills | Status: DC
  Filled 2021-05-16: qty 30, 30d supply, fill #0

## 2021-05-16 MED ORDER — RISPERIDONE 3 MG PO TABS
3.0000 mg | ORAL_TABLET | Freq: Every day | ORAL | 3 refills | Status: DC
Start: 2021-05-16 — End: 2021-06-23
  Filled 2021-05-16: qty 30, 30d supply, fill #0

## 2021-05-16 NOTE — Telephone Encounter (Signed)
Patient informed Probation officer that he has been doubling his medications as he has been more anxious and depressed.  He notes that he has been taking Risperdal 4 mg, mirtazapine 15 mg, and occasionally increases his trazodone dose.  He denies suicidal ideation today.  Provider agreeable to increase Risperdal to 3 mg and mirtazapine to 30 mg.  Provider encouraged patient to take medications as prescribed.  He endorsed understanding and agreed.  Today he denies SI/HI/VAH, mania, or paranoia.  No other concerns noted at this time.

## 2021-05-16 NOTE — Telephone Encounter (Signed)
Patient contacted office and left voicemail stating "I need to talk to Dr. Ronne Binning. This is not enough medicine. I need some more medicine, this is not enough. Please call be back when you get this message."

## 2021-05-16 NOTE — Telephone Encounter (Signed)
Patient called LVM stated that he needs refill. That he doesn't have enough medication to cope with the problem. And asked for a return call # [phone]

## 2021-05-17 ENCOUNTER — Other Ambulatory Visit: Payer: Self-pay

## 2021-05-24 ENCOUNTER — Other Ambulatory Visit: Payer: Self-pay

## 2021-06-07 ENCOUNTER — Inpatient Hospital Stay (HOSPITAL_COMMUNITY)
Admission: AD | Admit: 2021-06-07 | Discharge: 2021-06-23 | DRG: 885 | Disposition: A | Discharge status: Other Acute Inpatient Hospital | Payer: Federal, State, Local not specified - Other | Source: Intra-hospital | Attending: Psychiatry | Admitting: Psychiatry

## 2021-06-07 ENCOUNTER — Emergency Department (HOSPITAL_COMMUNITY)
Admission: EM | Admit: 2021-06-07 | Discharge: 2021-06-07 | Disposition: A | Discharge status: Other Acute Inpatient Hospital | Payer: Self-pay | Attending: Emergency Medicine | Admitting: Emergency Medicine

## 2021-06-07 ENCOUNTER — Encounter (HOSPITAL_COMMUNITY): Payer: Self-pay | Admitting: *Deleted

## 2021-06-07 ENCOUNTER — Encounter (HOSPITAL_COMMUNITY): Payer: Self-pay | Admitting: Nurse Practitioner

## 2021-06-07 ENCOUNTER — Other Ambulatory Visit: Payer: Self-pay

## 2021-06-07 DIAGNOSIS — E871 Hypo-osmolality and hyponatremia: Secondary | ICD-10-CM | POA: Insufficient documentation

## 2021-06-07 DIAGNOSIS — F5101 Primary insomnia: Secondary | ICD-10-CM | POA: Diagnosis present

## 2021-06-07 DIAGNOSIS — Z20822 Contact with and (suspected) exposure to covid-19: Secondary | ICD-10-CM | POA: Diagnosis present

## 2021-06-07 DIAGNOSIS — R9431 Abnormal electrocardiogram [ECG] [EKG]: Secondary | ICD-10-CM | POA: Diagnosis present

## 2021-06-07 DIAGNOSIS — Z79899 Other long term (current) drug therapy: Secondary | ICD-10-CM | POA: Insufficient documentation

## 2021-06-07 DIAGNOSIS — F333 Major depressive disorder, recurrent, severe with psychotic symptoms: Principal | ICD-10-CM

## 2021-06-07 DIAGNOSIS — R45851 Suicidal ideations: Secondary | ICD-10-CM | POA: Insufficient documentation

## 2021-06-07 DIAGNOSIS — Z720 Tobacco use: Secondary | ICD-10-CM | POA: Diagnosis present

## 2021-06-07 DIAGNOSIS — E559 Vitamin D deficiency, unspecified: Secondary | ICD-10-CM | POA: Diagnosis present

## 2021-06-07 DIAGNOSIS — F4010 Social phobia, unspecified: Secondary | ICD-10-CM | POA: Diagnosis present

## 2021-06-07 DIAGNOSIS — F332 Major depressive disorder, recurrent severe without psychotic features: Secondary | ICD-10-CM | POA: Diagnosis present

## 2021-06-07 DIAGNOSIS — R000 Tachycardia, unspecified: Secondary | ICD-10-CM | POA: Insufficient documentation

## 2021-06-07 DIAGNOSIS — I10 Essential (primary) hypertension: Secondary | ICD-10-CM | POA: Diagnosis present

## 2021-06-07 DIAGNOSIS — F411 Generalized anxiety disorder: Secondary | ICD-10-CM | POA: Diagnosis present

## 2021-06-07 HISTORY — DX: Unspecified asthma, uncomplicated: J45.909

## 2021-06-07 LAB — BASIC METABOLIC PANEL
Anion gap: 9 (ref 5–15)
BUN: 8 mg/dL (ref 8–23)
CO2: 25 mmol/L (ref 22–32)
Calcium: 9.3 mg/dL (ref 8.9–10.3)
Chloride: 97 mmol/L — ABNORMAL LOW (ref 98–111)
Creatinine, Ser: 1.11 mg/dL (ref 0.61–1.24)
GFR, Estimated: >60 mL/min (ref >60)
Glucose, Bld: 187 mg/dL — ABNORMAL HIGH (ref 70–99)
Potassium: 3.8 mmol/L (ref 3.5–5.1)
Sodium: 131 mmol/L — ABNORMAL LOW (ref 135–145)

## 2021-06-07 LAB — HEMOGLOBIN A1C
Hgb A1c MFr Bld: 5.1 % (ref 4.8–5.6)
Mean Plasma Glucose: 99.67 mg/dL

## 2021-06-07 LAB — COMPREHENSIVE METABOLIC PANEL
ALT: 12 U/L (ref 0–44)
AST: 22 U/L (ref 15–41)
Albumin: 4.5 g/dL (ref 3.5–5.0)
Alkaline Phosphatase: 63 U/L (ref 38–126)
Anion gap: 14 (ref 5–15)
BUN: 10 mg/dL (ref 8–23)
CO2: 21 mmol/L — ABNORMAL LOW (ref 22–32)
Calcium: 9.3 mg/dL (ref 8.9–10.3)
Chloride: 93 mmol/L — ABNORMAL LOW (ref 98–111)
Creatinine, Ser: 1.24 mg/dL (ref 0.61–1.24)
GFR, Estimated: >60 mL/min (ref >60)
Glucose, Bld: 110 mg/dL — ABNORMAL HIGH (ref 70–99)
Potassium: 3.6 mmol/L (ref 3.5–5.1)
Sodium: 128 mmol/L — ABNORMAL LOW (ref 135–145)
Total Bilirubin: 0.7 mg/dL (ref 0.3–1.2)
Total Protein: 7.5 g/dL (ref 6.5–8.1)

## 2021-06-07 LAB — SALICYLATE LEVEL: Salicylate Lvl: <7 mg/dL — ABNORMAL LOW (ref 7.0–30.0)

## 2021-06-07 LAB — LIPID PANEL
Cholesterol: 197 mg/dL (ref 0–200)
HDL: 44 mg/dL (ref >40)
LDL Cholesterol: 123 mg/dL — ABNORMAL HIGH (ref 0–99)
Total CHOL/HDL Ratio: 4.5 RATIO
Triglycerides: 149 mg/dL (ref <150)
VLDL: 30 mg/dL (ref 0–40)

## 2021-06-07 LAB — CBC
HCT: 41.3 % (ref 39.0–52.0)
Hemoglobin: 14 g/dL (ref 13.0–17.0)
MCH: 27.9 pg (ref 26.0–34.0)
MCHC: 33.9 g/dL (ref 30.0–36.0)
MCV: 82.4 fL (ref 80.0–100.0)
Platelets: 333 10*3/uL (ref 150–400)
RBC: 5.01 MIL/uL (ref 4.22–5.81)
RDW: 13.6 % (ref 11.5–15.5)
WBC: 6.5 10*3/uL (ref 4.0–10.5)
nRBC: 0 % (ref 0.0–0.2)

## 2021-06-07 LAB — RAPID URINE DRUG SCREEN, HOSP PERFORMED
Amphetamines: NOT DETECTED
Barbiturates: NOT DETECTED
Benzodiazepines: NOT DETECTED
Cocaine: NOT DETECTED
Opiates: NOT DETECTED
Tetrahydrocannabinol: NOT DETECTED

## 2021-06-07 LAB — MAGNESIUM
Magnesium: 1.7 mg/dL (ref 1.7–2.4)
Magnesium: 1.7 mg/dL (ref 1.7–2.4)

## 2021-06-07 LAB — RESP PANEL BY RT-PCR (FLU A&B, COVID) ARPGX2
Influenza A by PCR: NEGATIVE
Influenza B by PCR: NEGATIVE
SARS Coronavirus 2 by RT PCR: NEGATIVE

## 2021-06-07 LAB — TSH: TSH: 1.328 u[IU]/mL (ref 0.350–4.500)

## 2021-06-07 LAB — ACETAMINOPHEN LEVEL: Acetaminophen (Tylenol), Serum: 17 ug/mL (ref 10–30)

## 2021-06-07 LAB — ETHANOL: Alcohol, Ethyl (B): <10 mg/dL (ref <10)

## 2021-06-07 MED ORDER — TRAZODONE HCL 100 MG PO TABS
200.0000 mg | ORAL_TABLET | Freq: Every day | ORAL | Status: DC
  Administered 2021-06-07: 200 mg via ORAL
  Filled 2021-06-07 (×4): qty 2

## 2021-06-07 MED ORDER — MIRTAZAPINE 30 MG PO TABS
30.0000 mg | ORAL_TABLET | Freq: Every day | ORAL | Status: DC
Start: 2021-06-07 — End: 2021-06-07

## 2021-06-07 MED ORDER — MIRTAZAPINE 30 MG PO TABS
30.0000 mg | ORAL_TABLET | Freq: Every day | ORAL | Status: DC
  Administered 2021-06-07 – 2021-06-22 (×16): 30 mg via ORAL
  Filled 2021-06-07 (×19): qty 1

## 2021-06-07 MED ORDER — MAGNESIUM HYDROXIDE 400 MG/5ML PO SUSP
30.0000 mL | Freq: Every day | ORAL | Status: DC | PRN
  Administered 2021-06-09: 30 mL via ORAL
  Filled 2021-06-07: qty 30

## 2021-06-07 MED ORDER — POTASSIUM CHLORIDE CRYS ER 20 MEQ PO TBCR
40.0000 meq | EXTENDED_RELEASE_TABLET | Freq: Once | ORAL | Status: AC
  Administered 2021-06-07: 40 meq via ORAL
  Filled 2021-06-07: qty 2

## 2021-06-07 MED ORDER — ACETAMINOPHEN 325 MG PO TABS: 650.0000 mg | ORAL_TABLET | ORAL | Status: DC | PRN

## 2021-06-07 MED ORDER — AMLODIPINE BESYLATE 5 MG PO TABS
5.0000 mg | ORAL_TABLET | Freq: Every day | ORAL | Status: DC
  Administered 2021-06-08 – 2021-06-09 (×2): 5 mg via ORAL
  Filled 2021-06-07 (×5): qty 1

## 2021-06-07 MED ORDER — ACETAMINOPHEN 325 MG PO TABS
650.0000 mg | ORAL_TABLET | Freq: Four times a day (QID) | ORAL | Status: DC | PRN
  Administered 2021-06-09 – 2021-06-22 (×3): 650 mg via ORAL
  Filled 2021-06-07 (×3): qty 2

## 2021-06-07 MED ORDER — HYDROXYZINE HCL 25 MG PO TABS
50.0000 mg | ORAL_TABLET | Freq: Four times a day (QID) | ORAL | Status: DC | PRN
  Administered 2021-06-07: 50 mg via ORAL
  Filled 2021-06-07: qty 2

## 2021-06-07 MED ORDER — RISPERIDONE 2 MG PO TABS
3.0000 mg | ORAL_TABLET | Freq: Every day | ORAL | Status: DC
Start: 2021-06-07 — End: 2021-06-07

## 2021-06-07 MED ORDER — LORAZEPAM 1 MG PO TABS
1.0000 mg | ORAL_TABLET | Freq: Once | ORAL | Status: AC
  Administered 2021-06-07: 1 mg via ORAL
  Filled 2021-06-07: qty 1

## 2021-06-07 MED ORDER — TRAZODONE HCL 100 MG PO TABS
200.0000 mg | ORAL_TABLET | Freq: Every day | ORAL | Status: DC
  Administered 2021-06-07: 200 mg via ORAL
  Filled 2021-06-07: qty 2

## 2021-06-07 MED ORDER — HYDROXYZINE HCL 50 MG PO TABS
50.0000 mg | ORAL_TABLET | Freq: Four times a day (QID) | ORAL | Status: DC | PRN
  Administered 2021-06-08 – 2021-06-10 (×3): 50 mg via ORAL
  Filled 2021-06-07 (×3): qty 1

## 2021-06-07 MED ORDER — POTASSIUM CHLORIDE CRYS ER 20 MEQ PO TBCR
40.0000 meq | EXTENDED_RELEASE_TABLET | Freq: Once | ORAL | Status: AC
Start: 2021-06-07 — End: 2021-06-07
  Administered 2021-06-07: 40 meq via ORAL
  Filled 2021-06-07 (×2): qty 2

## 2021-06-07 MED ORDER — AMLODIPINE BESYLATE 5 MG PO TABS
5.0000 mg | ORAL_TABLET | Freq: Every day | ORAL | Status: DC
  Administered 2021-06-07: 5 mg via ORAL
  Filled 2021-06-07: qty 1

## 2021-06-07 MED ORDER — MELATONIN 5 MG PO TABS: 5.0000 mg | ORAL_TABLET | Freq: Every day | ORAL | Status: DC

## 2021-06-07 MED ORDER — ENSURE ENLIVE PO LIQD
237.0000 mL | Freq: Two times a day (BID) | ORAL | Status: DC
  Administered 2021-06-08 – 2021-06-23 (×29): 237 mL via ORAL
  Filled 2021-06-07 (×35): qty 237

## 2021-06-07 MED ORDER — RISPERIDONE 3 MG PO TABS
3.0000 mg | ORAL_TABLET | Freq: Every day | ORAL | Status: DC
  Administered 2021-06-07: 3 mg via ORAL
  Filled 2021-06-07 (×4): qty 1

## 2021-06-07 MED ORDER — SODIUM CHLORIDE 0.9 % IV BOLUS
1000.0000 mL | Freq: Once | INTRAVENOUS | Status: AC
Start: 2021-06-07 — End: 2021-06-07
  Administered 2021-06-07: 1000 mL via INTRAVENOUS

## 2021-06-07 MED ORDER — ALUM & MAG HYDROXIDE-SIMETH 200-200-20 MG/5ML PO SUSP: 30.0000 mL | ORAL | Status: DC | PRN

## 2021-06-07 MED ORDER — MELATONIN 5 MG PO TABS
5.0000 mg | ORAL_TABLET | Freq: Every day | ORAL | Status: DC
  Administered 2021-06-07 – 2021-06-22 (×16): 5 mg via ORAL
  Filled 2021-06-07 (×19): qty 1

## 2021-06-07 MED ORDER — INFLUENZA VAC SPLIT QUAD 0.5 ML IM SUSY
0.5000 mL | PREFILLED_SYRINGE | INTRAMUSCULAR | Status: DC
  Filled 2021-06-07: qty 0.5

## 2021-06-07 NOTE — ED Triage Notes (Signed)
Pt says he has a "lot of anxiety" and "deep depression." He has been out of his trazodone for 2 days. Taking his other medications as prescribed.  Pt says he is suicidal, "I have a bunch of knives, I could cut my neck, I just don't have the guts to follow through"

## 2021-06-07 NOTE — Progress Notes (Addendum)
Pt is a 66 y.o. male who was admitted voluntarily to [facility] after having been at [facility] ED for SI.    At the time of admission at [facility], pt denied SI/HI/VH. Pt endorsed having auditory hallucinations.   Pt also said that he is depressed and anxious and that "I'm messed up, I need help. My medications are not right."    Pt said that he ran out of his medications and did not take vistaril and trazodone for 3 days prior to his coming to [facility] ED for evaluation.  Pt reported that he is depressed, has lost 30 lbs in 3 months and he is not bathing or shaving regularly.  Pt also admits to having fear of driving his car because "my vision isn't the best, and my reflexes are much poorer than they used to be--my reaction times aren't as good."  Pt said that he is not eating well because "I often don't feel like going out to get food, so I don't eat."  After skin search , pt was not able to put his scrub pants back on without assistance from RN.  Pt was also unable to put his socks on without assistance from RN.    Pt says that he has no family support and no friends.  Pt said he has a daughter who is a professor at a [employer], [employer], but that pt has no contact with daughter.    Pt said he lives in a boarding house which he finds "very depressing."  Pt said a few weeks ago, he got so "worked up that I was walking fast and almost walked into traffic by accident.  It was 0230 in the morning."  Pt said he is open to moving to a group home for safety and so that his needs are met.  Pt said his goals for admission were to "get on the right mix of medications" and "to work on my mood, for it to improve."    Pt signed voluntary paperwork.  RN oriented pt to pt's room.

## 2021-06-07 NOTE — ED Notes (Signed)
Pt belongings removed, placed in burgundy scrubs, wanded by security. Belongings placed in cabinet in 9-12 section.

## 2021-06-07 NOTE — ED Notes (Signed)
Pt walked from [place] [address] to [employer] [address]. Pt belonging bag moved to [employer]. Pt denies HI. Pt endorses SI. Pt says he occasionally hears music from old times in his head. Pt ate 98% of lunch.

## 2021-06-07 NOTE — ED Notes (Signed)
Attempted IV x 2 with no success. 

## 2021-06-07 NOTE — ED Notes (Signed)
Attempted IV x2 with no success at this time.

## 2021-06-07 NOTE — ED Notes (Signed)
Gave pt belonging bag. Pt removed khaki shorts, blue short sleeve t-shirt, white socks, brown shoes, and key. Pt states all belongings are present. Pt says he did not bring a cell phone or wallet. Pt is wearing glasses.
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
## 2021-06-07 NOTE — BH Assessment (Signed)
Casey Silva Assessment Progress Note   Per [name], PA, this pt requires psychiatric hospitalization at this time.  [name], RN, [facility] has assigned pt to [facility] Rm [id number] to the service of Dr Nelda Marseille.  Pt has signed Voluntary Admission and Consent for Treatment, as well as Consent to Release Information to Dr Sharlett Iles at the [facility], and signed forms have been faxed to [facility].  EDP Godfrey Pick, MD and pt's nurse, Joellen Jersey, have been notified, and Joellen Jersey agrees to send original paperwork along with pt via [employer], and to call report to [phone].  Jalene Mullet, [place] Coordinator [phone]

## 2021-06-07 NOTE — ED Provider Notes (Signed)
Emergency Medicine Observation Re-evaluation Note  Casey Silva is a 66 y.o. male, seen on rounds today.  Pt initially presented to the ED for complaints of Anxiety and Suicidal Currently, the patient is sleeping.  Physical Exam  BP (!) 181/116 (BP Location: Left Arm)    Pulse (!) 107    Temp 97.8 F (36.6 C) (Oral)    Resp 16    SpO2 100%  Physical Exam General: Sleeping, nondistressed Cardiac: Extremities well perfused Lungs: Breathing is unlabored Psych: Deferred  ED Course / MDM  EKG:   I have reviewed the labs performed to date as well as medications administered while in observation.  Recent changes in the last 24 hours include patient presented to the ED this morning with concerns of anxiety, depression, and suicidal ideation.  He reportedly ran out of his psychiatric medications 3 days ago.  He had some mild hyponatremia on lab work.  Potassium was low normal and is replaced.  He was evaluated by TTS and inpatient admission was recommended.  Plan  Current plan is for inpatient psychiatric admission  Mithran Strike is not under involuntary commitment.     Godfrey Pick, MD 06/07/21 [phone]

## 2021-06-07 NOTE — ED Notes (Signed)
TTS started

## 2021-06-07 NOTE — [facility] Group Notes (Signed)
PT was informed but did not attend group.

## 2021-06-07 NOTE — ED Provider Notes (Signed)
[place] DEPT Provider Note   CSN: [id number] Arrival date & time: 06/07/21  0030     History  Chief Complaint  Patient presents with   Anxiety    Casey Silva is a 66 y.o. male with history of anxiety, depression, and bipolar 1 disorder who presents to the emergency department with complaints of anxiety, depression, and suicidal ideation.  Patient states he ran out of his psychiatric medicines 3 days ago which is led to a significant decline in his mental health.  He states he feels very anxious.  He states he just does not want to deal with everything anymore.  No other specific alleviating or aggravating factors.  Denies chest pain, shortness of breath, vomiting, hallucinations, or homicidal ideations.  HPI     Home Medications Prior to Admission medications   Medication Sig Start Date End Date Taking? Authorizing Provider  amLODipine (NORVASC) 5 MG tablet Take 1 tablet (5 mg total) by mouth daily. For high blood pressure 12/03/20   Lindell Spar I, NP  docusate sodium (COLACE) 100 MG capsule Take 1 capsule (100 mg total) by mouth daily. (May buy from over the counter): For constipation 12/03/20   Lindell Spar I, NP  hydrOXYzine (ATARAX) 50 MG tablet Take 1 tablet (50 mg total) by mouth every 6 (six) hours as needed for anxiety. 05/16/21   Eulis Canner E, NP  melatonin 5 MG TABS Take 1 tablet (5 mg total) by mouth at bedtime. For depression 05/16/21   Salley Slaughter, NP  mirtazapine (REMERON) 30 MG tablet Take 1 tablet (30 mg total) by mouth at bedtime. 05/16/21   Salley Slaughter, NP  polyethylene glycol (MIRALAX / GLYCOLAX) 17 g packet Take 17 g by mouth daily. (May buy from over the counter): For constipation 12/03/20   Lindell Spar I, NP  risperiDONE (RISPERDAL) 3 MG tablet Take 1 tablet (3 mg total) by mouth at bedtime. For mood control 05/16/21   Eulis Canner E, NP  traZODone (DESYREL) 100 MG tablet Take 2 tablets (200 mg total) by mouth  at bedtime. For sleep 05/16/21   Salley Slaughter, NP      Allergies    Dust mite mixed allergen ext [mite (d. farinae)] and Bee pollen    Review of Systems   Review of Systems  Constitutional:  Negative for chills and fever.  Respiratory:  Negative for shortness of breath.   Cardiovascular:  Negative for chest pain.  Gastrointestinal:  Negative for abdominal pain and vomiting.  Neurological:  Negative for syncope.  Psychiatric/Behavioral:  Positive for suicidal ideas. Negative for hallucinations. The patient is nervous/anxious.   All other systems reviewed and are negative.  Physical Exam Updated Vital Signs BP (!) 181/116 (BP Location: Left Arm)    Pulse (!) 107    Temp 97.8 F (36.6 C) (Oral)    Resp 16    SpO2 100%  Physical Exam Vitals and nursing note reviewed.  Constitutional:      General: He is not in acute distress.    Appearance: He is well-developed. He is not toxic-appearing.  HENT:     Head: Normocephalic and atraumatic.  Eyes:     General:        Right eye: No discharge.        Left eye: No discharge.     Conjunctiva/sclera: Conjunctivae normal.  Cardiovascular:     Rate and Rhythm: Regular rhythm. Tachycardia present.  Pulmonary:     Effort: Pulmonary effort  is normal. No respiratory distress.     Breath sounds: Normal breath sounds. No wheezing, rhonchi or rales.  Abdominal:     General: There is no distension.     Palpations: Abdomen is soft.     Tenderness: There is no abdominal tenderness.  Musculoskeletal:     Cervical back: Neck supple.  Skin:    General: Skin is warm and dry.     Findings: No rash.  Neurological:     Mental Status: He is alert.     Comments: Clear speech.   Psychiatric:        Mood and Affect: Mood is anxious.        Thought Content: Thought content includes suicidal ideation.    ED Results / Procedures / Treatments   Labs (all labs ordered are listed, but only abnormal results are displayed) Labs Reviewed   COMPREHENSIVE METABOLIC PANEL - Abnormal; Notable for the following components:      Result Value   Sodium 128 (*)    Chloride 93 (*)    CO2 21 (*)    Glucose, Bld 110 (*)    All other components within normal limits  SALICYLATE LEVEL - Abnormal; Notable for the following components:   Salicylate Lvl <7.9 (*)    All other components within normal limits  RESP PANEL BY RT-PCR (FLU A&B, COVID) ARPGX2  ETHANOL  ACETAMINOPHEN LEVEL  CBC  RAPID URINE DRUG SCREEN, HOSP PERFORMED    EKG None  Radiology No results found.  Procedures Procedures    Medications Ordered in ED Medications  hydrOXYzine (ATARAX) tablet 50 mg (50 mg Oral Given 06/07/21 0300)  melatonin tablet 5 mg (has no administration in time range)  amLODipine (NORVASC) tablet 5 mg (has no administration in time range)  potassium chloride SA (KLOR-CON M) CR tablet 40 mEq (has no administration in time range)    ED Course/ Medical Decision Making/ A&P                           Medical Decision Making Amount and/or Complexity of Data Reviewed Labs: ordered.  Risk OTC drugs. Prescription drug management.   Patient presents to the ED for behavioral health assessment. Nontoxic, hypertensive, mildly tachycardic on arrival. Anxious appearing, having a hard time sitting still on stretcher has been wandering. Endorsing SI.   Additional history obtained:  Additional history obtained from chart review & nursing note review.   Lab Tests:  Screening labs have been reviewed including CBC, CMP, acetaminophen/salicylate/ethanol level, UDS: mild hyponatremia @ 128, unclear definitive etiology, does take some medications that could be contributing to this such as remeron- will need follow up for this additionally on EKG: Qtc mildly prolonged @ 504, initially ordered home medications- he did receive atarax & trazodone prior to EKG, discontinued QT prolongating agents at this time (remeron, trazodone, risperdal). Will check  magnesium, Potassium on the lower end of normal therefore oral potassium ordered.   Medically cleared pending magnesium and AM attending has ordered repeat BMP. Seen by TTS- recommended for inpatient care, pending placement.   Portions of this note were generated with Lobbyist. Dictation errors may occur despite best attempts at proofreading.        Final Clinical Impression(s) / ED Diagnoses Final diagnoses:  Suicidal ideation  Hyponatremia    Rx / DC Orders ED Discharge Orders     None         Braven Wolk R, PA-C  06/07/21 0736    Fatima Blank, MD 06/07/21 [phone]

## 2021-06-07 NOTE — Tx Team (Signed)
Initial Treatment Plan 06/07/2021 6:24 PM Billie Intriago LFY:[id number]    PATIENT STRESSORS: Health problems   Medication change or noncompliance   Traumatic event     PATIENT STRENGTHS: Ability for insight  Communication skills    PATIENT IDENTIFIED PROBLEMS: Suicidal ideation   Increased needs with managing ADL's independently  Depression  Anxiety               DISCHARGE CRITERIA:  Ability to meet basic life and health needs Adequate post-discharge living arrangements Improved stabilization in mood, thinking, and/or behavior Reduction of life-threatening or endangering symptoms to within safe limits  PRELIMINARY DISCHARGE PLAN: Outpatient therapy  PATIENT/FAMILY INVOLVEMENT: This treatment plan has been presented to and reviewed with the patient, Coye Dawood.  The patient has been given the opportunity to ask questions and make suggestions.  Luna Glasgow, RN 06/07/2021, 6:24 PM

## 2021-06-07 NOTE — ED Notes (Signed)
Pt ambulated to BR with no assistance needed, steady gait, no difficulty noted or reported

## 2021-06-08 ENCOUNTER — Encounter (HOSPITAL_COMMUNITY): Payer: Self-pay

## 2021-06-08 DIAGNOSIS — F333 Major depressive disorder, recurrent, severe with psychotic symptoms: Secondary | ICD-10-CM | POA: Diagnosis not present

## 2021-06-08 MED ORDER — TRAZODONE HCL 100 MG PO TABS
100.0000 mg | ORAL_TABLET | Freq: Every day | ORAL | Status: DC
  Administered 2021-06-08 – 2021-06-22 (×15): 100 mg via ORAL
  Filled 2021-06-08 (×17): qty 1

## 2021-06-08 MED ORDER — RISPERIDONE 1 MG PO TABS
1.5000 mg | ORAL_TABLET | Freq: Every day | ORAL | Status: DC
  Administered 2021-06-08: 1.5 mg via ORAL
  Filled 2021-06-08 (×3): qty 1

## 2021-06-08 MED ORDER — POTASSIUM CHLORIDE CRYS ER 20 MEQ PO TBCR
20.0000 meq | EXTENDED_RELEASE_TABLET | Freq: Once | ORAL | Status: AC
  Administered 2021-06-08: 20 meq via ORAL
  Filled 2021-06-08: qty 1

## 2021-06-08 NOTE — Progress Notes (Addendum)
D: Patient remains isolative to room. Patient is not attending group activities. He presents with disheveled appearance. Patient states he has gone "months" without taking a shower. He is unsteady on his feet; however, we do not have a walker to give him currently. Patient states he is staying in bed due to "being tired and I haven't been sleeping well. ? ?A: Continue to monitor medication management and MD orders.  Safety checks completed every 15 minutes per protocol.  Offer support and encouragement as needed. ? ?R: Patient is isolative to room. ? 06/08/21 0900  ?Psych Admission Type (Psych Patients Only)  ?Admission Status Voluntary  ?Psychosocial Assessment  ?Patient Complaints Anxiety  ?Eye Contact Brief  ?Facial Expression Flat;Sad;Anxious;Sullen  ?Affect Depressed;Sullen  ?Speech Logical/coherent  ?Interaction Avoidant  ?Motor Activity Slow  ?Appearance/Hygiene Poor hygiene  ?Behavior Characteristics Unwilling to participate  ?Mood Preoccupied;Irritable  ?Thought Process  ?Coherency WDL  ?Content WDL  ?Delusions None reported or observed  ?Perception WDL  ?Hallucination None reported or observed  ?Judgment Impaired  ?Confusion WDL  ?Danger to Self  ?Current suicidal ideation? Denies  ?Danger to Others  ?Danger to Others None reported or observed  ? ? ?

## 2021-06-08 NOTE — [facility] Suicide Risk Assessment (Signed)
[facility] INPATIENT:  Family/Significant Other Suicide Prevention Education ? ?Suicide Prevention Education:  ?Patient Refusal for Family/Significant Other Suicide Prevention Education: The patient Casey Silva has refused to provide written consent for family/significant other to be provided Family/Significant Other Suicide Prevention Education during admission and/or prior to discharge.  Physician notified. ? ?Casey Silva E Yareni Creps ?06/08/2021, 2:26 PM ?

## 2021-06-08 NOTE — Progress Notes (Signed)
?   06/07/21 2212  ?Psych Admission Type (Psych Patients Only)  ?Admission Status Voluntary  ?Psychosocial Assessment  ?Patient Complaints Anxiety;Insomnia  ?Eye Contact Brief  ?Facial Expression Flat;Sad  ?Affect Depressed;Sad  ?Speech Logical/coherent  ?Interaction Assertive  ?Motor Activity Slow  ?Appearance/Hygiene In scrubs;Poor hygiene  ?Behavior Characteristics Irritable  ?Mood Irritable;Preoccupied  ?Thought Process  ?Coherency WDL  ?Content Ambivalence  ?Delusions WDL  ?Perception WDL  ?Hallucination None reported or observed  ?Judgment Impaired  ?Confusion WDL  ?Danger to Self  ?Current suicidal ideation? Denies  ?Danger to Others  ?Danger to Others None reported or observed  ? ?Casey Silva was isolative to his room.  He did not attend evening wrap up group.  He did come up to the medication window to take his hs medications.  He denied SI/HI or AVH at this time.  He kept saying that the Ativan helps more than his other medications and "I don't have suicidal thoughts when I take that."  Explained that the ativan he took earlier was just a one time dose and he will have to talk with the MD about his medications tomorrow.  Offered the mirtazapine, trazodone and Melatonin.  He stated "they don't work.  I have been on them for a long time.  The only thing that will help is the Ativan."  He finally agreed to take the above medications.  He appeared to be sleeping at 2215 and is still asleep at this time.  Q 15 minute checks maintained for safety.   ?

## 2021-06-08 NOTE — Group Note (Signed)
LCSW Group Therapy Note ? ?Group Date: 06/08/2021 ?Start Time: 1300 ?End Time: 1400 ? ? ?Type of Therapy and Topic:  Group Therapy: Using "I" Statements ? ?Participation Level:  Did Not Attend ? ?Description of Group:  ?Patients were asked to provide details of some interpersonal conflicts they have experienced. Patients were then educated about ?I? statements, communication which focuses on feelings or views of the speaker rather than what the other person is doing. T group members were asked to reflect on past conflicts and to provide specific examples for utilizing ?I? statements. ? ?Therapeutic Goals: ? ?Patients will verbalize understanding of ineffective communication and effective communication. ?Patients will be able to empathize with whom they are having conflict. ?Patients will practice effective communication in the form of ?I? statements. ? ? ? ?Summary of Patient Progress:  Did not attend ? ?Therapeutic Modalities:   ?Cognitive Behavioral Therapy ?Solution-Focused Therapy ? ? ? ?Nariyah Osias E Aisling Emigh, LCSW ?06/08/2021  2:20 PM   ? ?

## 2021-06-08 NOTE — [facility] Group Notes (Signed)
Patient did not attend the relaxation group. 

## 2021-06-08 NOTE — H&P (Addendum)
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 checks Vital signs: q12 hours Precautions: suicide, elopement, and assault  Medication MDD (major depressive disorder), recurrent episode, severe with psychosis ([place]) -Reduce Risperdal 3 mg to 1.5 mg nightly for psychosis (prolonged QTC) -Continue Remeron 30 mg nightly   Anxiety -Continue Hydroxyzine 50 mg every 6 hours PRN (monitoring QTC)  Insomnia -Reduce Trazodone from 200 mg to 100 mg nightly (prolonged  QTC) -Continue Melatonin 5 mg nightly   Medical Meds -Continue Norvasc 5 mg daily for hypertension - PT and OT consult for ambulation and assessment for ability to function independently after discharge - Walker ordered - Ensure tid and po intake encoruaged  Hyponatremia - Rechecking BMP for monitoring  Prolonged QTC - Repeat EKG shows QTC 438ms - will continue to monitor  Other PRNS -Continue Tylenol 650 mg every 6 hours PRN for mild pain -Continue Maalox 30 mg every 4 hrs PRN for indigestion -Continue Milk of Magnesia as needed every 6 hrs for constipation  Notes: Ativan causes worsening of gait & morning grogginess. Please do not order/administer to patient. It predisposes him to falls.  Discharge Planning: Social work and case management to assist with discharge planning and identification of hospital follow-up needs prior to discharge Estimated LOS: 5-7 days Discharge Concerns: Need to establish a safety plan; Medication compliance and effectiveness Discharge Goals: Return home with outpatient referrals for mental health follow-up including medication management/psychotherapy  I certify that inpatient services furnished  can reasonably be expected to improve the patient's condition.    Nicholes Rough, NP 3/1/20236:00 PM expected to improve the patient's condition.

## 2021-06-08 NOTE — BH IP Treatment Plan (Signed)
Interdisciplinary Treatment and Diagnostic Plan Update ? ?06/08/2021 ?Time of Session: 9:40am  ?Casey Silva ?MRN: [id number] ? ?Principal Diagnosis: MDD (major depressive disorder), recurrent episode, severe ([place]) ? ?Secondary Diagnoses: Principal Problem: ?  MDD (major depressive disorder), recurrent episode, severe ([place]) ? ? ?Current Medications:  ?Current Facility-Administered Medications  ?Medication Dose Route Frequency Provider Last Rate Last Admin  ? acetaminophen (TYLENOL) tablet 650 mg  650 mg Oral Q6H PRN Suella Broad, FNP      ? acetaminophen (TYLENOL) tablet 650 mg  650 mg Oral Q4H PRN Suella Broad, FNP      ? alum & mag hydroxide-simeth (MAALOX/MYLANTA) 200-200-20 MG/5ML suspension 30 mL  30 mL Oral Q4H PRN Starkes-Perry, Gayland Curry, FNP      ? amLODipine (NORVASC) tablet 5 mg  5 mg Oral Daily Suella Broad, FNP   5 mg at 06/08/21 0756  ? feeding supplement (ENSURE ENLIVE / ENSURE PLUS) liquid 237 mL  237 mL Oral BID BM Nkwenti, Doris, NP   237 mL at 06/08/21 1402  ? hydrOXYzine (ATARAX) tablet 50 mg  50 mg Oral Q6H PRN Suella Broad, FNP      ? influenza vac split quadrivalent PF (FLUARIX) injection 0.5 mL  0.5 mL Intramuscular Tomorrow-1000 Nkwenti, Doris, NP      ? magnesium hydroxide (MILK OF MAGNESIA) suspension 30 mL  30 mL Oral Daily PRN Burt Ek, Gayland Curry, FNP      ? melatonin tablet 5 mg  5 mg Oral QHS Suella Broad, FNP   5 mg at 06/07/21 2212  ? mirtazapine (REMERON) tablet 30 mg  30 mg Oral QHS Suella Broad, FNP   30 mg at 06/07/21 2212  ? risperiDONE (RISPERDAL) tablet 3 mg  3 mg Oral QHS Suella Broad, FNP   3 mg at 06/07/21 2212  ? traZODone (DESYREL) tablet 200 mg  200 mg Oral QHS Suella Broad, FNP   200 mg at 06/07/21 2212  ? ?PTA Medications: ?Medications Prior to Admission  ?Medication Sig Dispense Refill Last Dose  ? acetaminophen (TYLENOL) 500 MG tablet Take 1,000 mg by mouth every 6 (six) hours as needed for  mild pain or headache.     ? amLODipine (NORVASC) 5 MG tablet Take 1 tablet (5 mg total) by mouth daily. For high blood pressure (Patient not taking: Reported on 06/07/2021) 30 tablet 0   ? docusate sodium (COLACE) 100 MG capsule Take 1 capsule (100 mg total) by mouth daily. (May buy from over the counter): For constipation (Patient taking differently: Take 100 mg by mouth daily as needed for mild constipation. (May buy from over the counter): For constipation) 1 capsule 0   ? hydrOXYzine (ATARAX) 50 MG tablet Take 1 tablet (50 mg total) by mouth every 6 (six) hours as needed for anxiety. (Patient taking differently: Take 50-150 mg by mouth every 6 (six) hours as needed for anxiety.) 90 tablet 3   ? melatonin 5 MG TABS Take 1 tablet (5 mg total) by mouth at bedtime. For depression (Patient taking differently: Take 10 mg by mouth at bedtime. For depression) 30 tablet 3   ? mirtazapine (REMERON) 30 MG tablet Take 1 tablet (30 mg total) by mouth at bedtime. 30 tablet 3   ? polyethylene glycol (MIRALAX / GLYCOLAX) 17 g packet Take 17 g by mouth daily. (May buy from over the counter): For constipation (Patient taking differently: Take 17 g by mouth daily as needed for mild constipation. (May buy  from over the counter): For constipation) 1 each 0   ? risperiDONE (RISPERDAL) 3 MG tablet Take 1 tablet (3 mg total) by mouth at bedtime. For mood control 30 tablet 3   ? traZODone (DESYREL) 100 MG tablet Take 2 tablets (200 mg total) by mouth at bedtime. For sleep 60 tablet 3   ? ? ?Patient Stressors: Health problems   ?Medication change or noncompliance   ?Traumatic event   ? ?Patient Strengths: Ability for insight  ?Communication skills  ? ?Treatment Modalities: Medication Management, Group therapy, Case management,  ?1 to 1 session with clinician, Psychoeducation, Recreational therapy. ? ? ?Physician Treatment Plan for Primary Diagnosis: MDD (major depressive disorder), recurrent episode, severe ([place]) ?Long Term Goal(s):     ? ?Short Term Goals:   ? ?Medication Management: Evaluate patient's response, side effects, and tolerance of medication regimen. ? ?Therapeutic Interventions: 1 to 1 sessions, Unit Group sessions and Medication administration. ? ?Evaluation of Outcomes: Not Met ? ?Physician Treatment Plan for Secondary Diagnosis: Principal Problem: ?  MDD (major depressive disorder), recurrent episode, severe ([place]) ? ?Long Term Goal(s):    ? ?Short Term Goals:      ? ?Medication Management: Evaluate patient's response, side effects, and tolerance of medication regimen. ? ?Therapeutic Interventions: 1 to 1 sessions, Unit Group sessions and Medication administration. ? ?Evaluation of Outcomes: Not Met ? ? ?RN Treatment Plan for Primary Diagnosis: MDD (major depressive disorder), recurrent episode, severe ([place]) ?Long Term Goal(s): Knowledge of disease and therapeutic regimen to maintain health will improve ? ?Short Term Goals: Ability to remain free from injury will improve, Ability to participate in decision making will improve, Ability to verbalize feelings will improve, Ability to disclose and discuss suicidal ideas, and Ability to identify and develop effective coping behaviors will improve ? ?Medication Management: RN will administer medications as ordered by provider, will assess and evaluate patient's response and provide education to patient for prescribed medication. RN will report any adverse and/or side effects to prescribing provider. ? ?Therapeutic Interventions: 1 on 1 counseling sessions, Psychoeducation, Medication administration, Evaluate responses to treatment, Monitor vital signs and CBGs as ordered, Perform/monitor CIWA, COWS, AIMS and Fall Risk screenings as ordered, Perform wound care treatments as ordered. ? ?Evaluation of Outcomes: Not Met ? ? ?LCSW Treatment Plan for Primary Diagnosis: MDD (major depressive disorder), recurrent episode, severe ([place]) ?Long Term Goal(s): Safe transition to appropriate next level  of care at discharge, Engage patient in therapeutic group addressing interpersonal concerns. ? ?Short Term Goals: Engage patient in aftercare planning with referrals and resources, Increase social support, Increase emotional regulation, Facilitate acceptance of mental health diagnosis and concerns, Identify triggers associated with mental health/substance abuse issues, and Increase skills for wellness and recovery ? ?Therapeutic Interventions: Assess for all discharge needs, 1 to 1 time with Education officer, museum, Explore available resources and support systems, Assess for adequacy in community support network, Educate family and significant other(s) on suicide prevention, Complete Psychosocial Assessment, Interpersonal group therapy. ? ?Evaluation of Outcomes: Not Met ? ? ?Progress in Treatment: ?Attending groups: Yes. ?Participating in groups: Yes. ?Taking medication as prescribed: Yes. ?Toleration medication: Yes. ?Family/Significant other contact made: Yes, individual(s) contacted:  If consents are provided  ?Patient understands diagnosis: Yes. ?Discussing patient identified problems/goals with staff: Yes. ?Medical problems stabilized or resolved: Yes. ?Denies suicidal/homicidal ideation: Yes. ?Issues/concerns per patient self-inventory: No. ? ? ?New problem(s) identified: No, Describe:  None  ? ?New Short Term/Long Term Goal(s): medication stabilization, elimination of SI thoughts, development of comprehensive mental  wellness plan.  ? ?Patient Goals: "To get on the right medications"  ? ?Discharge Plan or Barriers: Patient recently admitted. CSW will continue to follow and assess for appropriate referrals and possible discharge planning.  ? ?Reason for Continuation of Hospitalization: Depression ?Hallucinations ?Medication stabilization ?Suicidal ideation ? ?Estimated Length of Stay: 3 to 5 days  ? ? ?Scribe for Treatment Team: ?Darleen Crocker, Latanya Presser ?06/08/2021 ?2:09 PM ?

## 2021-06-08 NOTE — Group Note (Signed)
Recreation Therapy Group Note ? ? ?Group Topic:Stress Management  ?Group Date: 06/08/2021 ?Start Time: 0930 ?End Time: 0950 ?Facilitators: Victorino Sparrow, LRT,CTRS ?Location: [place] ? ? ?Goal Area(s) Addresses:  ?Patient will actively participate in stress management techniques presented during session.  ?Patient will successfully identify benefit of practicing stress management post d/c.  ? ?Group Description: Guided Imagery. LRT provided education, instruction, and demonstration on practice of visualization via guided imagery. Patient was asked to participate in the technique introduced during session. LRT debriefed including topics of mindfulness, stress management and specific scenarios each patient could use these techniques. Patients were given suggestions of ways to access scripts post d/c and encouraged to explore Youtube and other apps available on smartphones, tablets, and computers. ? ? ?Affect/Mood: N/A ?  ?Participation Level: Did not attend ?  ? ?Clinical Observations/Individualized Feedback:   ? ? ?Plan: Continue to engage patient in RT group sessions 2-3x/week. ? ? ?Victorino Sparrow, LRT,CTRS ?06/08/2021 11:49 AM ?

## 2021-06-08 NOTE — [facility] Suicide Risk Assessment (Addendum)
Suicide Risk Assessment ? ?Admission Assessment    ?[facility] Admission Suicide Risk Assessment ? ? ?Nursing information obtained from:  Patient ?Demographic factors:  Male, Caucasian, Low socioeconomic status, Living alone, Unemployed ?Current Mental Status:  NA ?Loss Factors:  Financial problems / change in socioeconomic status, Decline in physical health ?Historical Factors:  Impulsivity ?Risk Reduction Factors:  NA ? ?Total Time spent with patient: 1 hour ?Principal Problem: MDD (major depressive disorder), recurrent, severe, with psychosis ([place]) ?Diagnosis:  Principal Problem: ?  MDD (major depressive disorder), recurrent, severe, with psychosis ([place]) ?Active Problems: ?  MDD (major depressive disorder), recurrent episode, severe ([place]) ? ?History of Present Illness: As per HPI from pt's initial presentation to the [facility]: "Casey Silva is a 66 y.o. male with history of anxiety, depression, and bipolar 1 disorder who presents to the emergency department with complaints of anxiety, depression, and suicidal ideation.  Patient states he ran out of his psychiatric medicines 3 days ago which is led to a significant decline in his mental health.  He states he feels very anxious.  He states he just does not want to deal with everything anymore." ? ?Continued Clinical Symptoms: Pt reports worsening anxiety, panic attacks, feelings of worthlessness, helplessness, hopelessness, low energy, no motivation, anhedonia, poor appetite, & feelings of frustration, loneliness, restlessness, visual and auditory hallucinations. He reports that he has had these symptoms for over a year, and they have worsened over the past two weeks. Continuous hospitalization is necessary at this time to treat and stabilize his symptoms. ? ? ?Alcohol Use Disorder Identification Test Final Score (AUDIT): 0 ?The "Alcohol Use Disorders Identification Test", Guidelines for Use in Primary Care, Second Edition.  World Pharmacologist [facility]). ?Score between 0-7:   no or low risk or alcohol related problems. ?Score between 8-15:  moderate risk of alcohol related problems. ?Score between 16-19:  high risk of alcohol related problems. ?Score 20 or above:  warrants further diagnostic evaluation for alcohol dependence and treatment. ? ?CLINICAL FACTORS:  ? Panic Attacks ?Depression:   Anhedonia ?Hopelessness ?Insomnia ?Currently Psychotic ?Previous Psychiatric Diagnoses and Treatments ? ?Musculoskeletal: ?Strength & Muscle Tone: within normal limits ?Gait & Station: unsteady ?Patient leans: N/A ? ?Psychiatric Specialty Exam: ? ?Presentation  ?General Appearance: Disheveled ? ?Eye Contact:Fair ? ?Speech:Clear and Coherent ? ?Speech Volume:Normal ? ?Handedness:Right ? ?Mood and Affect  ?Mood:Depressed; Hopeless ? ?Affect:Blunt ? ?Thought Process  ?Thought Processes:Coherent ? ?Descriptions of Associations:Intact ? ?Orientation:Partial ? ?Thought Content:Logical ? ?History of Schizophrenia/Schizoaffective disorder:No ? ?Duration of Psychotic Symptoms:Greater than six months ? ?Hallucinations:Hallucinations: Auditory; Visual ? ?Ideas of Reference:None ? ?Suicidal Thoughts:Suicidal Thoughts: Yes, Active ?SI Active Intent and/or Plan: Without Plan; Without Intent ? ?Homicidal Thoughts:Homicidal Thoughts: No ? ?Sensorium  ?Memory:Immediate Good ? ?Judgment:Poor ? ?Insight:Poor ? ?Executive Functions  ?Concentration:Fair ? ?Attention Span:Fair ? ?Recall:Fair ? ?[place] ? ?Language:Fair ? ?Psychomotor Activity  ?Psychomotor Activity:Psychomotor Activity: Psychomotor Retardation; Shuffling Gait; Tremor ? ?Assets  ?Assets:Communication Skills; Housing ? ?Sleep  ?Sleep:Sleep: Fair ? ?Physical Exam: ?Physical Exam ?Constitutional:   ?   Appearance: He is ill-appearing.  ?HENT:  ?   Head: Normocephalic.  ?   Nose: Nose normal.  ?Eyes:  ?   Pupils: Pupils are equal, round, and reactive to light.  ?Pulmonary:  ?   Effort: Pulmonary effort is normal.  ?Neurological:  ?   Motor:  Weakness present.  ?   Coordination: Coordination abnormal.  ? ?Review of Systems  ?Constitutional: Negative.  Negative for fever.  ?HENT: Negative.  Negative  for sore throat.   ?Eyes: Negative.   ?Respiratory: Negative.  Negative for cough and shortness of breath.   ?Cardiovascular: Negative.  Negative for chest pain.  ?Gastrointestinal: Negative.  Negative for heartburn and nausea.  ?Genitourinary: Negative.   ?Neurological: Negative.   ?Psychiatric/Behavioral:  Positive for depression, hallucinations and suicidal ideas. The patient is nervous/anxious and has insomnia.   ?Blood pressure (!) 146/98, pulse 90, temperature 98.6 ?F (37 ?C), temperature source Oral, resp. rate 18, height 5' 6.54" (1.69 m), weight 71.8 kg, SpO2 98 %. Body mass index is 25.13 kg/m?. ? ? ?COGNITIVE FEATURES THAT CONTRIBUTE TO RISK:  ?None   ? ?SUICIDE RISK:  ? Moderate:  Frequent suicidal ideation with limited intensity, and duration, some specificity in terms of plans, no associated intent, good self-control, limited dysphoria/symptomatology, some risk factors present, and identifiable protective factors, including available and accessible social support. ? ?PLAN OF CARE:  ? ?Safety and Monitoring: ?Voluntary admission to inpatient psychiatric unit for safety, stabilization and treatment ?Daily contact with patient to assess and evaluate symptoms and progress in treatment ?Patient's case to be discussed in multi-disciplinary team meeting ?Observation Level : q15 minute checks ?Vital signs: q12 hours ?Precautions: suicide, elopement, and assault ?  ?Medication ?MDD (major depressive disorder), recurrent episode, severe with psychosis ([place]) ?-Reduce Risperdal 3 mg to 1.5 mg nightly for psychosis (prolonged QTC) ?-Continue Remeron 30 mg nightly  ?  ?Anxiety ?-Continue Hydroxyzine 50 mg every 6 hours PRN ?  ?Insomnia ?-Reduce Trazodone from 200 mg to 100 mg nightly (prolonged QTC) ?-Continue Melatonin 5 mg nightly  ?  ?Medical  Meds ?-Continue Norvasc 5 mg daily for hypertension ?  ?Other PRNS ?-Continue Tylenol 650 mg every 6 hours PRN for mild pain ?-Continue Maalox 30 mg every 4 hrs PRN for indigestion ?-Continue Milk of Magnesia as needed every 6 hrs for constipation ?  ?Notes: Ativan causes worsening of gait & morning grogginess. Please do not order/administer to patient. It predisposes him to falls. ?  ?Discharge Planning: ?Social work and case management to assist with discharge planning and identification of hospital follow-up needs prior to discharge ?Estimated LOS: 5-7 days ?Discharge Concerns: Need to establish a safety plan; Medication compliance and effectiveness ?Discharge Goals: Return home with outpatient referrals for mental health follow-up including medication management/psychotherapy ?  ?I certify that inpatient services furnished can reasonably be expected to improve the patient's condition.  ? ?Nicholes Rough, NP ?06/08/2021, 6:07 PM ?

## 2021-06-08 NOTE — Progress Notes (Signed)
D) Pt received calm, visible, not participating in milieu, and in no acute distress. Pt A & O x4. Pt denies SI, HI, A/ V H, depression, anxiety and pain at this time. A) Pt encouraged to drink fluids. Pt encouraged to come to staff with needs. Pt encouraged to attend and participate in groups. Pt encouraged to set reachable goals.  R) Pt remained safe on unit, in no acute distress, will continue to assess.   ? ? ? 06/08/21 1930  ?Psych Admission Type (Psych Patients Only)  ?Admission Status Voluntary  ?Psychosocial Assessment  ?Patient Complaints Anxiety  ?Eye Contact Brief  ?Facial Expression Flat  ?Affect Depressed  ?Speech Logical/coherent  ?Interaction Assertive  ?Motor Activity Slow  ?Appearance/Hygiene Poor hygiene  ?Behavior Characteristics Irritable  ?Mood Irritable  ?Thought Process  ?Coherency WDL  ?Content WDL  ?Delusions None reported or observed  ?Perception WDL  ?Hallucination None reported or observed  ?Judgment Impaired  ?Confusion None  ?Danger to Self  ?Current suicidal ideation? Denies  ?Danger to Others  ?Danger to Others None reported or observed  ? ? ?

## 2021-06-08 NOTE — BHH Counselor (Signed)
Adult Comprehensive Assessment ? ?Patient ID: Casey Silva, male   DOB: May 16, 1955, 66 y.o.   MRN: [id number] ? ?Information Source: ?Information source: Patient ? ?Current Stressors:  ?Patient states their primary concerns and needs for treatment are:: patient states that he stopped taking medications and it went all down hill after that ?Patient states their goals for this hospitilization and ongoing recovery are:: patient states that he wants to stabilize on medications ?Educational / Learning stressors: 12th grade education ?Employment / Job issues: no job ?Family Relationships: patient states he has no family ?Financial / Lack of resources (include bankruptcy): patient receives a limited income with retirement ?Housing / Lack of housing: currently living in a boarding house ?Physical health (include injuries & life threatening diseases): patient states that his health is not good, he is sore all over ?Social relationships: no social relationships ?Substance abuse: denies substance use ?Bereavement / Loss: no stressors ? ?Living/Environment/Situation:  ?Living Arrangements: Alone ?Living conditions (as described by patient or guardian): boarding house ?Who else lives in the home?: other residents of boarding house ?How long has patient lived in current situation?: 6/7 years ?What is atmosphere in current home: Chaotic ? ?Family History:  ?Marital status: Single ?Are you sexually active?: No ?What is your sexual orientation?: none ?Has your sexual activity been affected by drugs, alcohol, medication, or emotional stress?: n/a ?Does patient have children?: No ?How is patient's relationship with their children?: Previously, pt stated he has not seen/spoken to his daughter in many years. Tonight he denies he has any children. ? ?Childhood History:  ?  ? ?Education:  ?Highest grade of school patient has completed: 12th grade ?Currently a student?: No ?Learning disability?: No ? ?Employment/Work Situation:   ?Employment  Situation: Unemployed ?Work Stressors: patient currently unemployed. 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 ?Has Patient ever Been in the Military?: No ? ?Financial Resources:   ?Museum/gallery curator resources:  (retirement) ?Does patient have a representative payee or guardian?: No ? ?Alcohol/Substance Abuse:   ?What has been your use of drugs/alcohol within the last 12 months?: none ?If attempted suicide, did drugs/alcohol play a role in this?: No ?Alcohol/Substance Abuse Treatment Hx: Denies past history ?If yes, describe treatment: n/a ?Has alcohol/substance abuse ever caused legal problems?: No ? ?Social Support System:   ?Heritage manager System: None ?Describe Community Support System: patient could not identify support system ?Type of faith/religion: none ?How does patient's faith help to cope with current illness?: none ? ?Leisure/Recreation:   ?Do You Have Hobbies?: No ? 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 ?Discharge Plan:   ?Currently receiving community mental health services: Yes (From Whom) Marye Round, established at [facility]) ?Patient states concerns and preferences for aftercare planning are: none ?Patient states they will know when they are safe and ready for discharge when:  none ?Does patient have access to transportation?:  Yes ?Does patient have financial barriers related to discharge medications?: Yes ?Patient description of barriers related to discharge medications: no insurance ?Will patient be returning to same living situation after discharge?: Yes ? ?Summary/Recommendations:   ?Summary and Recommendations (to be completed by the evaluator): Casey Silva is a 66 year old male who was admitted to the hospital due to not taking his medications and starting everything, "went downhill."  Patient states that it was not time to refill medications as reason he wasn't able to take medications.  Patient has several previous hospitalization and discussed that he has anehdonia and there is not much living for. Patient currently has a limited income through retirement.  He does not have medicare or medicaid.  Patient reports no family or social support system and chooses to isolate instead.  Patient denies substance use. While patient is here, he can benefit from crisis stabilization, medications evaluation, group therapy, psycho-education, case management, and discharge planning. ? ?Joylene Wescott E Jaydalyn Demattia. 06/08/2021 ?

## 2021-06-08 NOTE — Progress Notes (Signed)
Adult Psychoeducational Group Note ? ?Date:  06/08/2021 ?Time:  8:12 PM ? ?Group Topic/Focus:  ?Wrap-Up Group:   The focus of this group is to help patients review their daily goal of treatment and discuss progress on daily workbooks. ? ?Participation Level:  Active ? ?Participation Quality:  Appropriate ? ?Affect:  Appropriate ? ?Cognitive:  Appropriate ? ?Insight: Appropriate ? ?Engagement in Group:  Engaged and Improving ? ?Modes of Intervention:  Discussion ? ?Additional Comments: Patient said his day was a 1 his goal to get meds. His c oping skills sleep ? ?Casey Silva ?06/08/2021, 8:12 PM ?

## 2021-06-09 DIAGNOSIS — F333 Major depressive disorder, recurrent, severe with psychotic symptoms: Secondary | ICD-10-CM | POA: Diagnosis not present

## 2021-06-09 LAB — BASIC METABOLIC PANEL
Anion gap: 12 (ref 5–15)
BUN: 10 mg/dL (ref 8–23)
CO2: 23 mmol/L (ref 22–32)
Calcium: 9.8 mg/dL (ref 8.9–10.3)
Chloride: 95 mmol/L — ABNORMAL LOW (ref 98–111)
Creatinine, Ser: 0.89 mg/dL (ref 0.61–1.24)
GFR, Estimated: >60 mL/min (ref >60)
Glucose, Bld: 103 mg/dL — ABNORMAL HIGH (ref 70–99)
Potassium: 3.7 mmol/L (ref 3.5–5.1)
Sodium: 130 mmol/L — ABNORMAL LOW (ref 135–145)

## 2021-06-09 MED ORDER — VENLAFAXINE HCL ER 37.5 MG PO CP24
37.5000 mg | ORAL_CAPSULE | Freq: Every day | ORAL | Status: DC
  Administered 2021-06-10 – 2021-06-13 (×4): 37.5 mg via ORAL
  Filled 2021-06-09 (×6): qty 1

## 2021-06-09 MED ORDER — OLANZAPINE 5 MG PO TABS
5.0000 mg | ORAL_TABLET | Freq: Every day | ORAL | Status: DC
  Administered 2021-06-09 – 2021-06-21 (×13): 5 mg via ORAL
  Filled 2021-06-09 (×15): qty 1

## 2021-06-09 MED ORDER — ADULT MULTIVITAMIN W/MINERALS CH
1.0000 | ORAL_TABLET | Freq: Every day | ORAL | Status: DC
Start: 2021-06-09 — End: 2021-06-23
  Administered 2021-06-09 – 2021-06-23 (×15): 1 via ORAL
  Filled 2021-06-09 (×18): qty 1

## 2021-06-09 MED ORDER — AMLODIPINE BESYLATE 10 MG PO TABS
10.0000 mg | ORAL_TABLET | Freq: Every day | ORAL | Status: DC
  Administered 2021-06-10 – 2021-06-22 (×13): 10 mg via ORAL
  Filled 2021-06-09 (×17): qty 1

## 2021-06-09 NOTE — BHH Group Notes (Signed)
PT was informed but did not attend group. ?

## 2021-06-09 NOTE — Group Note (Signed)
Date:  06/09/2021 ?Time:  9:56 AM ? ?Group Topic/Focus:  ?Orientation:   The focus of this group is to educate the patient on the purpose and policies of crisis stabilization and provide a format to answer questions about their admission.  The group details unit policies and expectations of patients while admitted. ? ? ? ?Participation Level:  Did Not Attend ? ?Participation Quality:   ? ?Affect:   ? ?Cognitive:   ? ?Insight:  ? ?Engagement in Group:   ? ?Modes of Intervention:   ? ?Additional Comments:   ? ?Casey Silva ?06/09/2021, 9:56 AM ? ?

## 2021-06-09 NOTE — Progress Notes (Signed)
NUTRITION ASSESSMENT ?RD working remotely. ? ? ?Pt identified as at risk on the Malnutrition Screen Tool ? ?INTERVENTION: ?- continue Ensure Enlive BID, each supplement provides 350 kcal and 20 grams of protein. ?- [facility] staff to continue to encourage PO intakes of meals, snacks, and supplements.  ? ? ?NUTRITION DIAGNOSIS: ?Unintentional weight loss related to sub-optimal intake as evidenced by pt report.  ? ?Goal: ?Pt to meet >/= 90% of their estimated nutrition needs. ? ?Monitor:  ?PO intake ? ?Assessment:  ?Patient admitted for increased anxiety, increased depression, and SI. He reports running out of psych meds 3 days PTA.  ? ?Weight on 2/28 was 158 lb and weight on 12/28/20 was 164 lb. This indicates 6 lb weight loss in 5 months. ? ?Ensure ordered BID at the time of admission and patient has been accepting all bottles offered since that time.  ? ? ?66 y.o. male ? ?Height: ?Ht Readings from Last 1 Encounters:  ?06/07/21 5' 6.54" (1.69 m)  ? ? ?Weight: ?Wt Readings from Last 1 Encounters:  ?06/07/21 71.8 kg  ? ? ?Weight Hx: ?Wt Readings from Last 10 Encounters:  ?06/07/21 71.8 kg  ?12/28/20 74.4 kg  ?11/23/20 79.8 kg  ?11/22/20 79.4 kg  ?03/25/20 84.8 kg  ?02/03/20 80.7 kg  ?09/24/19 79.8 kg  ?09/23/19 80 kg  ?02/27/13 80.4 kg  ?02/13/13 80.6 kg  ? ? ?BMI:  Body mass index is 25.13 kg/m?Marland Kitchen ?Pt meets criteria for overweight based on current BMI. ? ?Estimated Nutritional Needs: ?Kcal: 25-30 kcal/kg ?Protein: > 1 gram protein/kg ?Fluid: 1 ml/kcal ? ?Diet Order:  ?Diet Order   ? ?       ?  Diet regular Room service appropriate? Yes; Fluid consistency: Thin  Diet effective now       ?  ? ?  ?  ? ?  ? ?Pt is also offered choice of unit snacks mid-morning and mid-afternoon.  ?Pt is eating as desired.  ? ?Lab results and medications reviewed.  ? ? ? ?Jarome Matin, MS, RD, LDN ?Inpatient Clinical Dietitian ?RD pager # available in [place]  ?After hours/weekend pager # available in [place] ? ? ?

## 2021-06-09 NOTE — Progress Notes (Signed)
DAR NOTE: Patient presents with flat affect and depressed mood.  Report passive SI during assessment but verbally contract for safety. Described energy level as low with poor concentration.  Rates depression at 10, hopelessness at 10, and anxiety at 8.  Maintained on routine safety checks.  Medications given as prescribed.  Support and encouragement offered as needed.  States goal for today is "take a shower."  Patient visible in the dayroom briefly and returned to his room.  Food and fluid intake encouraged. Patient is safe on the unit.   ?

## 2021-06-09 NOTE — Progress Notes (Addendum)
[facility] MD Progress Note  06/09/2021 2:21 PM Kortney Schoenfelder  MRN:  [id number]  Subjective:  Adeel reports: "I am not feeling very well today. I didn't sleep at all last night and I have body aches and pains."  Today's note (06/09/2021): Pt's chart reviewed, his case discussed with the treatment team. Pt with flat affect and depressed mood, attention to personal hygiene and grooming is fair, eye contact is good, speech is clear & coherent. Thought contents are organized, but with some illogical content.  Pt is endorsing suicidal thoughts, denies having a plan and is able to verbally contract for safety on the unit. He presents with ideas of reference, states that whenever he is watching tv, it gives him special messages just for him once or twice. Pt also with thought broadcasting, and states that his thoughts are so loud that other people know what he is thinking. He reports +AH, and states that last night, he heard bells ringing. He currently denies VH. He rates his anxiety as 7 (10 being worst), and rates the pain around his waist area as 8 (10 being worst). Pt encouraged to get out of bed so that he can assisted to shower, but is resistant, stating that he is weak and does not know if he can do it. He also reports a phobia of water, and mentioned during admission yesterday that he has not showered in a month. He reports a fair appetite and a low energy level.   Labs reviewed. NA-131, repeated and 130. Care order entered for pt to have a sitter during meals as he might not be eating well.  LDL-123, EKG from 2/28 with prolonged QTC at 504, repeated EKG-NSR and QTC of 463. Risperdal reduced from 3 mg nightly to 1.5 mg nightly yesterday, and Trazodone reduced from 200 mg nightly to 100 mg yesterday.  Pt has been asking for Ativan since yesterday and states that it is the only thing that helps him sleep. He reports poor sleep, but has mostly been reclusive to his room and in bed sleeping. He has been educated that he  will not be given Ativan since one dose taken the night prior led to his gait being grossly unstable. He is also more prone to falls with this medication. His Psychosis is persistent, and pt was on Risperdal prior to admission. Will discontinue Risperdal since it has not been effective in managing pt's symptoms. Will start Zyprexa 5 mg nightly for management of his psychosis. BP earlier in the morning was 163/97, and rechecked was 140/93. Will increase Norvasc to 10 mg daily.   HPI: As per HPI from pt's initial presentation to the [facility]: "Casimir Barcellos is a 66 y.o. male with history of anxiety, depression, and bipolar 1 disorder who presents to the emergency department with complaints of anxiety, depression, and suicidal ideation.  Patient states he ran out of his psychiatric medicines 3 days ago which is led to a significant decline in his mental health.  He states he feels very anxious.  He states he just does not want to deal with everything anymore."  Principal Problem: MDD (major depressive disorder), recurrent, severe, with psychosis ([place]) Diagnosis: Principal Problem:   MDD (major depressive disorder), recurrent, severe, with psychosis ([place]) Active Problems:   MDD (major depressive disorder), recurrent episode, severe ([place])  Total Time spent with patient: 20 minutes  Past Psychiatric History: As above  Past Medical History:  Past Medical History:  Diagnosis Date   Anxiety    Asthma  Depression    History reviewed. No pertinent surgical history. Family History:  Family History  Problem Relation Age of Onset   Bipolar disorder Mother    Family Psychiatric  History: none reported Social History:  Social History   Substance and Sexual Activity  Alcohol Use No     Social History   Substance and Sexual Activity  Drug Use No    Social History   Socioeconomic History   Marital status: Single    Spouse name: Not on file   Number of children: 1   Years of education: Not on  file   Highest education level: GED or equivalent  Occupational History   Not on file  Tobacco Use   Smoking status: Never   Smokeless tobacco: Never  Vaping Use   Vaping Use: Unknown  Substance and Sexual Activity   Alcohol use: No   Drug use: No   Sexual activity: Not Currently  Other Topics Concern   Not on file  Social History Narrative   Marital status:  Single   Children: Daughter   Employment:  Unemployment. previous work for [employer].   Alcohol:  None   Drugs:  none   Exercise:  Regularly very   Religion:  Jewish   Social Determinants of Radio broadcast assistant Strain: Not on file  Food Insecurity: Not on file  Transportation Needs: Not on file  Physical Activity: Not on file  Stress: Not on file  Social Connections: Not on file   Additional Social History: Pt reports that he lives alone in a boarding home, has his own room and shares the common areas with a room mate. He reports a 12th grade education, reports once being married and divorced, and reports that he has a daughter who lives in [place], but reports being estranged from her for over 7 years. He reports no support system, states that he goes some days without eating because he is unable to drive, and unable to go get himself food. He reports a phobia of water, of dying, and of growing old.    Sleep: Poor  Appetite:  Poor  Current Medications: Current Facility-Administered Medications  Medication Dose Route Frequency Provider Last Rate Last Admin   acetaminophen (TYLENOL) tablet 650 mg  650 mg Oral Q6H PRN Starkes-Perry, Gayland Curry, FNP       alum & mag hydroxide-simeth (MAALOX/MYLANTA) 200-200-20 MG/5ML suspension 30 mL  30 mL Oral Q4H PRN Starkes-Perry, Gayland Curry, FNP       [START ON 06/10/2021] amLODipine (NORVASC) tablet 10 mg  10 mg Oral Daily Nkwenti, Doris, NP       feeding supplement (ENSURE ENLIVE / ENSURE PLUS) liquid 237 mL  237 mL Oral BID BM Nkwenti, Doris, NP   237 mL at 06/08/21  1402   hydrOXYzine (ATARAX) tablet 50 mg  50 mg Oral Q6H PRN Suella Broad, FNP   50 mg at 06/09/21 0125   influenza vac split quadrivalent PF (FLUARIX) injection 0.5 mL  0.5 mL Intramuscular Tomorrow-1000 Nkwenti, Doris, NP       magnesium hydroxide (MILK OF MAGNESIA) suspension 30 mL  30 mL Oral Daily PRN Starkes-Perry, Gayland Curry, FNP       melatonin tablet 5 mg  5 mg Oral QHS Suella Broad, FNP   5 mg at 06/08/21 2056   mirtazapine (REMERON) tablet 30 mg  30 mg Oral QHS Suella Broad, FNP   30 mg at 06/08/21 2055  OLANZapine (ZYPREXA) tablet 5 mg  5 mg Oral QHS Nkwenti, Doris, NP       traZODone (DESYREL) tablet 100 mg  100 mg Oral QHS Nicholes Rough, NP   100 mg at 06/08/21 2056    Lab Results:  Results for orders placed or performed during the hospital encounter of 06/07/21 (from the past 48 hour(s))  Basic metabolic panel     Status: Abnormal   Collection Time: 06/09/21  6:14 AM  Result Value Ref Range   Sodium 130 (L) 135 - 145 mmol/L   Potassium 3.7 3.5 - 5.1 mmol/L   Chloride 95 (L) 98 - 111 mmol/L   CO2 23 22 - 32 mmol/L   Glucose, Bld 103 (H) 70 - 99 mg/dL    Comment: Glucose reference range applies only to samples taken after fasting for at least 8 hours.   BUN 10 8 - 23 mg/dL   Creatinine, Ser 0.89 0.61 - 1.24 mg/dL   Calcium 9.8 8.9 - 10.3 mg/dL   GFR, Estimated >60 >60 mL/min    Comment: (NOTE) Calculated using the CKD-EPI Creatinine Equation (2021)    Anion gap 12 5 - 15    Comment: Performed at [facility], [place] [address] [place], [place] [address]    Blood Alcohol level:  Lab Results  Component Value Date   ETH <10 06/07/2021   ETH <10 60/45/4098    Metabolic Disorder Labs: Lab Results  Component Value Date   HGBA1C 5.1 06/07/2021   MPG 99.67 06/07/2021   MPG 111.15 11/24/2020   No results found for: PROLACTIN Lab Results  Component Value Date   CHOL 197 06/07/2021   TRIG 149 06/07/2021   HDL 44  06/07/2021   CHOLHDL 4.5 06/07/2021   VLDL 30 06/07/2021   LDLCALC 123 (H) 06/07/2021   LDLCALC 148 (H) 11/24/2020   Physical Findings: AIMS: Facial and Oral Movements Muscles of Facial Expression: None, normal Lips and Perioral Area: None, normal Jaw: None, normal Tongue: Minimal,Extremity Movements Upper (arms, wrists, hands, fingers): Minimal Lower (legs, knees, ankles, toes): None, normal, Trunk Movements Neck, shoulders, hips: None, normal, Overall Severity Severity of abnormal movements (highest score from questions above): None, normal Incapacitation due to abnormal movements: None, normal Patient's awareness of abnormal movements (rate only patient's report): No Awareness, Dental Status Current problems with teeth and/or dentures?: No Does patient usually wear dentures?: No  CIWA: n/a  COWS:  n/a AIMS-2  Musculoskeletal: Strength & Muscle Tone: decreased Gait & Station: unsteady, shuffle Patient leans: N/A  Psychiatric Specialty Exam:  Presentation  General Appearance: disheveled, frail appearing, malodorous  Eye Contact:Fair  Speech:Clear and Coherent  Speech Volume:Normal  Handedness:Right  Mood and Affect  Mood:Depressed; Anxious  Affect:Flat, guarded  Thought Process  Thought Processes:Concrete, linear  Descriptions of Associations:Intact  Orientation:Full (Time, Place and Person)  Thought Content:Illogical - reports AH, thought broadcasting and ideas of reference; is not grossly responding to internal/external stimuli on exam; appears guarded  History of Schizophrenia/Schizoaffective disorder:No  Duration of Psychotic Symptoms:Greater than six months  Hallucinations:Hallucinations: Auditory  Ideas of Reference:Delusions (ideas of reference and thought broadcasting)  Suicidal Thoughts:Suicidal Thoughts: Yes, Active SI Active Intent and/or Plan: Without Plan; Without Intent  Homicidal Thoughts:Homicidal Thoughts: No  Sensorium   Memory:Fair  Judgment:Poor  Insight:Poor  Executive Functions  Concentration:Fair  Attention Span:Fair  [place]  Psychomotor Activity  Psychomotor Activity:psychomotor slowing, shuffling gait  Assets  Assets:Communication Skills  Sleep  Total time  unrecorded  Physical Exam Constitutional:      Appearance: He is ill-appearing.  HENT:     Head: Normocephalic.     Nose: Nose normal.  Eyes:     Pupils: Pupils are equal, round, and reactive to light.  Pulmonary:     Effort: No respiratory distress.  Neurological:     Mental Status: He is alert.   Review of Systems  Constitutional: Negative.  Negative for fever.  HENT: Negative.  Negative for sore throat.   Eyes: Negative.   Respiratory: Negative.  Negative for cough and shortness of breath.   Cardiovascular: Negative.  Negative for chest pain.  Gastrointestinal:  Negative for heartburn, nausea and vomiting.  Genitourinary: Negative.   Musculoskeletal:  Positive for back pain and joint pain.  Skin: Negative.   Neurological: Negative.  Negative for dizziness and headaches.  Endo/Heme/Allergies:        Allergies: Dust mite  Psychiatric/Behavioral:  Positive for depression, hallucinations and suicidal ideas. The patient is nervous/anxious and has insomnia.   Blood pressure (!) 140/93, pulse 96, temperature 97.6 F (36.4 C), temperature source Oral, resp. rate 18, height 5' 6.54" (1.69 m), weight 71.8 kg, SpO2 98 %. Body mass index is 25.13 kg/m.  PLAN Safety and Monitoring: Voluntary admission to inpatient psychiatric unit for safety, stabilization and treatment Daily contact with patient to assess and evaluate symptoms and progress in treatment Patient's case to be discussed in multi-disciplinary team meeting Observation Level : q15 minute checks Vital signs: q12 hours Precautions: suicide, elopement, and assault   Medication MDD (major depressive disorder),  recurrent episode, severe with psychosis ([place]) -Discontinue Risperdal (3/2).  -Start Zyprexa 5 mg nightly for psychosis with close monitoring of QTC - Start Effexor XR 37.5mg  and monitor Na+ closely -Continue Remeron 30 mg nightly  - Prompting to attended to ADLs and to eat - Will order sitter for meals to encourage to eat and will order room lock out for meals and groups   Anxiety -Continue Hydroxyzine 50 mg every 6 hours PRN (monitoring QTC)   Insomnia -Continue Trazodone 100 mg nightly (monitoring QTC) -Continue Melatonin 5 mg nightly  - Improved sleep hygiene encouraged   Medical Meds -Increase Norvasc to 10 mg daily for hypertension - PT and OT consult for ambulation and assessment for ability to function independently after discharge - Walker ordered - Ensure tid and po intake encoruaged   Hyponatremia - Trending BMP for monitoring - Hope will correct as po intake improves   Prolonged QTC - Repeat EKG shows QTC 444ms - will continue to monitor. Will repeat EKG tomorrow (3/3).   Other PRNS -Continue Tylenol 650 mg every 6 hours PRN for mild pain -Continue Maalox 30 mg every 4 hrs PRN for indigestion -Continue Milk of Magnesia as needed every 6 hrs for constipation   Notes: Ativan causes worsening of gait & morning grogginess. Please do not order/administer to patient. It predisposes him to falls.   Discharge Planning: Social work and case management to assist with discharge planning and identification of hospital follow-up needs prior to discharge Estimated LOS: 5-7 days Discharge Concerns: Need to establish a safety plan; Medication compliance and effectiveness Discharge Goals: Return home with outpatient referrals for mental health follow-up including medication management/psychotherapy  Nicholes Rough, NP 06/09/2021, 2:21 PM

## 2021-06-10 DIAGNOSIS — F333 Major depressive disorder, recurrent, severe with psychotic symptoms: Secondary | ICD-10-CM | POA: Diagnosis not present

## 2021-06-10 LAB — VITAMIN D 25 HYDROXY (VIT D DEFICIENCY, FRACTURES): Vit D, 25-Hydroxy: 14.94 ng/mL — ABNORMAL LOW (ref 30–100)

## 2021-06-10 LAB — FOLATE: Folate: 9.8 ng/mL (ref >5.9)

## 2021-06-10 LAB — VITAMIN B12: Vitamin B-12: 325 pg/mL (ref 180–914)

## 2021-06-10 MED ORDER — HYDROXYZINE HCL 25 MG PO TABS
25.0000 mg | ORAL_TABLET | Freq: Four times a day (QID) | ORAL | Status: DC | PRN
  Administered 2021-06-10 – 2021-06-13 (×7): 25 mg via ORAL
  Filled 2021-06-10 (×7): qty 1

## 2021-06-10 NOTE — Progress Notes (Signed)
?   06/10/21 1130  ?Psych Admission Type (Psych Patients Only)  ?Admission Status Voluntary  ?Psychosocial Assessment  ?Patient Complaints Anxiety;Depression  ?Eye Contact Brief  ?Facial Expression Flat  ?Affect Anxious;Depressed  ?Speech Logical/coherent;Soft  ?Interaction Assertive  ?Motor Activity Tremors  ?Appearance/Hygiene In scrubs  ?Behavior Characteristics Cooperative  ?Mood Depressed;Anxious  ?Thought Process  ?Coherency WDL  ?Content WDL  ?Delusions None reported or observed  ?Perception WDL  ?Hallucination None reported or observed  ?Judgment Impaired  ?Confusion None  ?Danger to Self  ?Current suicidal ideation? Passive  ?Self-Injurious Behavior No self-injurious ideation or behavior indicators observed or expressed   ?Agreement Not to Harm Self Yes  ?Description of Agreement verbal contract  ?Danger to Others  ?Danger to Others None reported or observed  ? ? ?

## 2021-06-10 NOTE — Group Note (Signed)
Recreation Therapy Group Note ? ? ?Group Topic:Stress Management  ?Group Date: 06/10/2021 ?Start Time: 51 ?End Time: 8295 ?Facilitators: Victorino Sparrow, LRT,CTRS ?Location: [place] ? ? ?Goal Area(s) Addresses:  ?Patient will identify positive stress management techniques. ?Patient will identify benefits of using stress management post d/c. ? ?Group Description:  Meditation.  LRT played a mountain meditation which patients were encouraged to visualize a mountain and imagine taking on its qualities.  The meditation spoke of the mountain being able to stand through anything its faced with and still keep the qualities it has.  Patients were to listen and follow along as meditation played to fully engage in activity. ? ? ?Affect/Mood: N/A ?  ?Participation Level: Did not attend ?  ? ?Clinical Observations/Individualized Feedback:   ? ? ?Plan: Continue to engage patient in RT group sessions 2-3x/week. ? ? ?Victorino Sparrow, LRT, CTRS ?06/10/2021 12:24 PM ?

## 2021-06-10 NOTE — Progress Notes (Signed)
?   06/09/21 2119  ?Psych Admission Type (Psych Patients Only)  ?Admission Status Voluntary  ?Psychosocial Assessment  ?Patient Complaints Loneliness;Isolation;Anxiety;Depression  ?Eye Contact Brief  ?Facial Expression Flat  ?Affect Appropriate to circumstance;Flat  ?Speech Logical/coherent;Soft  ?Interaction Assertive  ?Motor Activity Slow  ?Appearance/Hygiene In scrubs;Poor hygiene  ?Behavior Characteristics Cooperative  ?Mood Depressed;Anxious  ?Thought Process  ?Coherency WDL  ?Content WDL  ?Delusions None reported or observed  ?Perception WDL  ?Hallucination None reported or observed  ?Judgment Impaired  ?Confusion None  ?Danger to Self  ?Current suicidal ideation? Passive  ?Self-Injurious Behavior No self-injurious ideation or behavior indicators observed or expressed   ?Agreement Not to Harm Self Yes  ?Description of Agreement Verbal Contract  ?Danger to Others  ?Danger to Others None reported or observed  ? ? ?

## 2021-06-10 NOTE — Progress Notes (Signed)
Occupational Therapy Evaluation ? ?Patient typically resides in boarding house and was independent with self care, driving until past few months with increased depression and SI. Per chart review patient with poor hygiene/self care upon [facility] admission. OT referral to assess patient cognitive status and ability to perform daily routine safely. Patient participate in pill box task completing in 5:35 seconds. Patient completed task while making zero medication errors. Note patient has ability to self identify errors as he almost made 2 medications errors, but was quickly able to identify and self correct. Patient followed directions appropriately reading aloud medication labels and organizing with pill box for 1 week. Also discussed with patient what he feels his limitations are performing basic ADL tasks. Patient states when he has high anxiety and panic attacks it becomes difficulty for him to perform self care tasks and his extremities become tremulous. Instruct patient in diaphragmatic breathing techniques and told him if he starts to feel anxiety worsen sit down and focus on his breathing vs remain standing and trying to perform ADL tasks. Patient verbalize understanding and return demonstrated 5 diaphragmatic breaths. Based on OT assessment this date feel patient has physical and cognitive abilities to perform self care tasks, appears that his psychological difficulties (anxiety) hinder his performance the most. Thank you for OT consult.  ? ? ? 06/10/21 1400  ?OT Visit Information  ?Last OT Received On 06/10/21  ?Assistance Needed +1  ?History of Present Illness Pt admit due to severe depression and SI thoughts. Pt states he has had a decline over past month with decreased motivation therefore decreased ability to perform basic tasks for himself and basic mobility making him more weak and with some falls. Pt's gait was very shuffled and unsteady on admission, however today seemed to be slowly improving. Pt lives in  a boarding home ( apartment) with individual bedroom and bathroom, but shared kitchen, common area. PMH: anxiety, depression.  ?Precautions  ?Precautions Fall  ?Precaution Comments pt reports he has recetnly falling due to his body getting ahead of his feet when walking. " my feet shuffle"  ?Restrictions  ?Weight Bearing Restrictions No  ?Home Living  ?Family/patient expects to be discharged to: Private residence  ?Living Arrangements Alone  ?Type of Home Apartment  ?Prior Function  ?Prior Level of Function  Independent/Modified Independent;Driving  ?Mobility Comments Pt states he has been doing fine with these things except for last few months things have become a lot harder and difficult .  ?ADLs Comments Patient states when he has a panic attack his extremities shake making it more difficult to perform IADL.  ?Communication  ?Communication No difficulties  ?Pain Assessment  ?Pain Assessment No/denies pain  ?Cognition  ?Arousal/Alertness Awake/alert  ?Behavior During Therapy Anxious  ?Overall Cognitive Status Within Functional Limits for tasks assessed  ?General Comments Patient able to perform pill box test without any errors, see clinical impression above  ?Upper Extremity Assessment  ?Upper Extremity Assessment Overall WFL for tasks assessed  ?ADL  ?General ADL Comments Educate patient on compensatory strategies to maximize safety when performing ADL tasks susch as lower body dressing. Patient verbalizes understanding.  ?Bed Mobility  ?Overal bed mobility Independent  ?Transfers  ?Overall transfer level Independent  ?Balance  ?Overall balance assessment Mild deficits observed, not formally tested  ?OT - End of Session  ?Activity Tolerance Patient tolerated treatment well  ?Patient left Other (comment) ?(return to group therapy)  ?Nurse Communication Other (comment) ?(OT complete)  ?OT Assessment  ?OT Recommendation/Assessment Patient does not need  any further OT services  ?OT Visit Diagnosis Other abnormalities  of gait and mobility (R26.89)  ?OT Problem List Decreased activity tolerance  ?AM-PAC OT "6 Clicks" Daily Activity Outcome Measure (Version 2)  ?Help from another person eating meals? 4  ?Help from another person taking care of personal grooming? 4  ?Help from another person toileting, which includes using toliet, bedpan, or urinal? 4  ?Help from another person bathing (including washing, rinsing, drying)? 4  ?Help from another person to put on and taking off regular upper body clothing? 4  ?Help from another person to put on and taking off regular lower body clothing? 4  ?6 Click Score 24  ?Progressive Mobility  ?What is the highest level of mobility based on the progressive mobility assessment? Level 6 (Walks independently in room and hall) - Balance while walking in room without assist - Complete  ?Activity Ambulated independently in hallway  ?OT Recommendation  ?Follow Up Recommendations No OT follow up  ?Assistance recommended at discharge PRN  ?Functional Status Assessent Patient has not had a recent decline in their functional status  ?OT Equipment None recommended by OT  ?Acute Rehab OT Goals  ?Patient Stated Goal To feel less anxious  ?OT Goal Formulation All assessment and education complete, DC therapy  ?OT Time Calculation  ?OT Start Time (ACUTE ONLY) 1320  ?OT Stop Time (ACUTE ONLY) 1345  ?OT Time Calculation (min) 25 min  ?OT General Charges  ?$OT Visit 1 Visit  ?OT Evaluation  ?$OT Eval Low Complexity 1 Low  ?OT Treatments  ?$Cognitive Funtion inital Initial 15 mins  ?Written Expression  ?Dominant Hand Right  ? ? ?Delbert Phenix OT ?OT pager: [phone] ? ?

## 2021-06-10 NOTE — Group Note (Signed)
Date:  06/10/2021 ?Time:  10:26 AM ? ?Group Topic/Focus:  ?Orientation:   The focus of this group is to educate the patient on the purpose and policies of crisis stabilization and provide a format to answer questions about their admission.  The group details unit policies and expectations of patients while admitted. ? ? ? ?Participation Level:  Did Not Attend ? ?Participation Quality:   ? ?Affect:   ? ?Cognitive:   ? ?Insight:  ? ?Engagement in Group:   ? ?Modes of Intervention:   ? ?Additional Comments:  Did not attend ? ?Jerrye Beavers ?06/10/2021, 10:26 AM ? ?

## 2021-06-10 NOTE — Progress Notes (Addendum)
[facility] MD Progress Note  06/10/2021 8:14 AM Casey Silva  MRN:  [id number]  Chief Complaint: SI  Reason for Admission:  Casey Silva is a 66 y.o. male with a history of MDD with psychotic features and GAD, who was initially admitted for inpatient psychiatric hospitalization on 06/07/2021 for management of self-care deficits, worsening depression and SI with thoughts of stabbing himself. The patient is currently on Hospital Day 3.   Chart Review from last 24 hours:  The patient's chart was reviewed and nursing notes were reviewed. The patient's case was discussed in multidisciplinary team meeting. Per nursing, he reported passive SI to nurses yesterday but verbally contracted for safety and denied SI on night shift.  He was isolative to his room and required prompts to shower.  He was briefly visible in the day room in the afternoon but did not attend groups.  He is requiring prompts for food and fluid intake.  Per [facility] he was compliant with scheduled medications and did not require as needed medications for agitation.  Information Obtained Today During Patient Interview: The patient was seen and evaluated on the unit. On assessment today the patient reports that he slept better overnight and has a fair appetite today.  He was encouraged to continue attending to his IADLs and states that he does plan to attend group today.  He states he feels his mood is improving and he denies any medication side effects with start of Effexor and changed to Zyprexa.  He denies SI, HI, AVH, ideas of reference or first rank symptoms.  He denies paranoia.  He remains ruminative about his ability to function independently after discharge and his housing situation and I explained that PT and OT are coming to do an assessment today.  I discussed that given his recent QTc changes and low sodium that we are having to make cautious medication changes and that it is imperative that he increase his fluid and food intake.  He was advised  that if he cannot start getting out of bed that he will be placed on room lockout for meals and groups which he verbalized understanding.   Principal Problem: MDD (major depressive disorder), recurrent, severe, with psychosis ([place]) Diagnosis: Principal Problem:   MDD (major depressive disorder), recurrent, severe, with psychosis ([place]) Active Problems:   MDD (major depressive disorder), recurrent episode, severe ([place])  Total Time Spent in Direct Patient Care:  I personally spent 30 minutes on the unit in direct patient care. The direct patient care time included face-to-face time with the patient, reviewing the patient's chart, communicating with other professionals, and coordinating care. Greater than 50% of this time was spent in counseling or coordinating care with the patient regarding goals of hospitalization, psycho-education, and discharge planning needs.   Past Psychiatric History: see H&P  Past Medical History:  Past Medical History:  Diagnosis Date   Anxiety    Asthma    Depression    Family History:  Family History  Problem Relation Age of Onset   Bipolar disorder Mother    Family Psychiatric  History: see H&P  Social History:  Social History   Substance and Sexual Activity  Alcohol Use No     Social History   Substance and Sexual Activity  Drug Use No    Social History   Socioeconomic History   Marital status: Single    Spouse name: Not on file   Number of children: 1   Years of education: Not on file   Highest  education level: GED or equivalent  Occupational History   Not on file  Tobacco Use   Smoking status: Never   Smokeless tobacco: Never  Vaping Use   Vaping Use: Unknown  Substance and Sexual Activity   Alcohol use: No   Drug use: No   Sexual activity: Not Currently  Other Topics Concern   Not on file  Social History Narrative   Marital status:  Single   Children: Daughter   Employment:  Unemployment. previous work for [employer].    Alcohol:  None   Drugs:  none   Exercise:  Regularly very   Religion:  Jewish   Social Determinants of Radio broadcast assistant Strain: Not on file  Food Insecurity: Not on file  Transportation Needs: Not on file  Physical Activity: Not on file  Stress: Not on file  Social Connections: Not on file   Additional Social History: Pt reports that he lives alone in a boarding home, has his own room and shares the common areas with a room mate. He reports a 12th grade education, reports once being married and divorced, and reports that he has a daughter who lives in [place], but reports being estranged from her for over 85 years. He reports no support system, states that he goes some days without eating because he is unable to drive, and unable to go get himself food. He reports a phobia of water, of dying, and of growing old.    Sleep:Improving   Appetite:  Fair - requiring prompts to eat  Current Medications: Current Facility-Administered Medications  Medication Dose Route Frequency Provider Last Rate Last Admin   acetaminophen (TYLENOL) tablet 650 mg  650 mg Oral Q6H PRN Suella Broad, FNP   650 mg at 06/09/21 1539   alum & mag hydroxide-simeth (MAALOX/MYLANTA) 200-200-20 MG/5ML suspension 30 mL  30 mL Oral Q4H PRN Starkes-Perry, Gayland Curry, FNP       amLODipine (NORVASC) tablet 10 mg  10 mg Oral Daily Nkwenti, Doris, NP       feeding supplement (ENSURE ENLIVE / ENSURE PLUS) liquid 237 mL  237 mL Oral BID BM Nkwenti, Doris, NP   237 mL at 06/09/21 1540   hydrOXYzine (ATARAX) tablet 50 mg  50 mg Oral Q6H PRN Suella Broad, FNP   50 mg at 06/09/21 0125   influenza vac split quadrivalent PF (FLUARIX) injection 0.5 mL  0.5 mL Intramuscular Tomorrow-1000 Nkwenti, Doris, NP       magnesium hydroxide (MILK OF MAGNESIA) suspension 30 mL  30 mL Oral Daily PRN Suella Broad, FNP   30 mL at 06/09/21 1611   melatonin tablet 5 mg  5 mg Oral QHS Suella Broad, FNP    5 mg at 06/09/21 2119   mirtazapine (REMERON) tablet 30 mg  30 mg Oral QHS Suella Broad, FNP   30 mg at 06/09/21 2119   multivitamin with minerals tablet 1 tablet  1 tablet Oral Daily Viann Fish E, MD   1 tablet at 06/09/21 1712   OLANZapine (ZYPREXA) tablet 5 mg  5 mg Oral QHS Nkwenti, Doris, NP   5 mg at 06/09/21 2119   traZODone (DESYREL) tablet 100 mg  100 mg Oral QHS Nicholes Rough, NP   100 mg at 06/09/21 2119   venlafaxine XR (EFFEXOR-XR) 24 hr capsule 37.5 mg  37.5 mg Oral Q breakfast Harlow Asa, MD        Lab Results:  Results  for orders placed or performed during the hospital encounter of 06/07/21 (from the past 48 hour(s))  Basic metabolic panel     Status: Abnormal   Collection Time: 06/09/21  6:14 AM  Result Value Ref Range   Sodium 130 (L) 135 - 145 mmol/L   Potassium 3.7 3.5 - 5.1 mmol/L   Chloride 95 (L) 98 - 111 mmol/L   CO2 23 22 - 32 mmol/L   Glucose, Bld 103 (H) 70 - 99 mg/dL    Comment: Glucose reference range applies only to samples taken after fasting for at least 8 hours.   BUN 10 8 - 23 mg/dL   Creatinine, Ser 0.89 0.61 - 1.24 mg/dL   Calcium 9.8 8.9 - 10.3 mg/dL   GFR, Estimated >60 >60 mL/min    Comment: (NOTE) Calculated using the CKD-EPI Creatinine Equation (2021)    Anion gap 12 5 - 15    Comment: Performed at [facility], [place] [address], [place], [place] [address]    Blood Alcohol level:  Lab Results  Component Value Date   ETH <10 06/07/2021   ETH <10 67/20/9470    Metabolic Disorder Labs: Lab Results  Component Value Date   HGBA1C 5.1 06/07/2021   MPG 99.67 06/07/2021   MPG 111.15 11/24/2020   No results found for: PROLACTIN Lab Results  Component Value Date   CHOL 197 06/07/2021   TRIG 149 06/07/2021   HDL 44 06/07/2021   CHOLHDL 4.5 06/07/2021   VLDL 30 06/07/2021   LDLCALC 123 (H) 06/07/2021   LDLCALC 148 (H) 11/24/2020   Physical Findings: AIMS: Facial and Oral Movements Muscles of  Facial Expression: None, normal Lips and Perioral Area: None, normal Jaw: None, normal Tongue: Minimal,Extremity Movements Upper (arms, wrists, hands, fingers): Minimal Lower (legs, knees, ankles, toes): None, normal, Trunk Movements Neck, shoulders, hips: None, normal, Overall Severity Severity of abnormal movements (highest score from questions above): None, normal Incapacitation due to abnormal movements: None, normal Patient's awareness of abnormal movements (rate only patient's report): No Awareness, Dental Status Current problems with teeth and/or dentures?: No Does patient usually wear dentures?: No  CIWA: n/a  COWS:  n/a  Musculoskeletal: Strength & Muscle Tone: decreased Gait & Station: shuffling gait, normal station Patient leans: N/A  Psychiatric Specialty Exam:  Presentation  General Appearance: improved hygiene but still disheveled appearing  Eye Contact:Fair  Speech:Clear and Coherent  Speech Volume:Normal  Mood and Affect  Mood:Depressed; Anxious  Affect:constricted, guarded  Thought Process  Thought Processes:Concrete, linear  Descriptions of Associations:Intact  Orientation:oriented to month, year, and place  Thought Content:Illogical - denies AVH, paranoia, ideas of reference, or first rank symptoms, is not grossly responding to internal/external stimuli on exam; appears guarded  Hallucinations:Denied  Ideas of Reference:Denied  Suicidal Thoughts:Denied  Homicidal Thoughts:Denied  Sensorium  Memory:Fair  Judgment:Poor  Insight:Poor  Executive Functions  Concentration:Fair  Attention Span:Fair  [place]  Psychomotor Activity  Psychomotor Activity:psychomotor slowing, shuffling gait  Assets  Assets:Communication Skills  Sleep  6 hours overnight but also slept during the day yesterday  Physical Exam HENT:     Head: Normocephalic.     Nose: Nose normal.  Pulmonary:     Effort:  Pulmonary effort is normal.  Neurological:     Mental Status: He is alert.   Review of Systems  Respiratory:  Negative for shortness of breath.   Cardiovascular:  Negative for chest pain.  Gastrointestinal:  Negative for constipation, diarrhea, nausea  and vomiting.  Neurological:  Negative for headaches.  Endo/Heme/Allergies:        Allergies: Dust mite  Blood pressure (!) 127/94, pulse 95, temperature 97.7 F (36.5 C), resp. rate 18, height 5' 6.54" (1.69 m), weight 71.8 kg, SpO2 96 %. Body mass index is 25.13 kg/m.  ASSESSMENT:  Diagnoses / Active Problems: MDD recurrent severe with psychotic features GAD by hx  PLAN: Safety and Monitoring:  -- Voluntary admission to inpatient psychiatric unit for safety, stabilization and treatment  -- Daily contact with patient to assess and evaluate symptoms and progress in treatment  -- Patient's case to be discussed in multi-disciplinary team meeting  -- Observation Level : q15 minute checks  -- Vital signs:  q12 hours  -- Precautions: suicide, elopement, and assault  2. Psychiatric Diagnoses and Treatment:   MDD recurrent severe with psychotic features GAD by hx -Discontinued Risperdal (3/2) -Continue Zyprexa 5 mg nightly for psychosis with close monitoring of QTC - Continue Effexor XR 37.5mg  and monitor Na+ closely -Continue Remeron 30 mg nightly  -Reduce Hydroxyzine to 25 mg every 6 hours PRN anxiety due to daytime sedation (monitoring QTC) - Prompting to attended to ADLs and to eat with PT and OT consults pending - Will need MOCA when he can cooperate for testing - Continue sitter for meals to encourage to eat and will order room lock out for meals and groups  -- Metabolic profile and EKG monitoring obtained while on an atypical antipsychotic (Lipid Panel: WNL except for LDL 123; HbgA1c:5.1; QTc:470ms) Rechecking EKG for QTC monitoring on present meds  -- Encouraged patient to participate in unit milieu and in scheduled group  therapies    Insomnia -Continue Trazodone 100 mg nightly (monitoring QTC) -Continue Melatonin 5 mg nightly  - Improved sleep hygiene encouraged    3. Medical Issues Being Addressed:   Hyponatremia - Trending BMP for monitoring - Hope will correct as po intake improves   Prolonged QTC - resolved - EKG shows QTC 48ms - will continue to monitor. Will repeat EKG today for trending   HTN  -- Continue Norvasc 10mg  daily  Self-care deficits - PT and OT consult for ambulation and assessment for ability to function independently after discharge - Walker ordered - Ensure tid and po intake encouraged - MVI ordered daily - Checking MOCA when he will comply and will order B12, B1, and Vit D levels - SW to place APS referral  4. Discharge Planning:   -- Social work and case management to assist with discharge planning and identification of hospital follow-up needs prior to discharge  -- Estimated LOS: 7-10 days  -- Discharge Concerns: Need to establish a safety plan; Medication compliance and effectiveness  -- Discharge Goals: Return home with outpatient referrals for mental health follow-up including medication management/psychotherapy     Harlow Asa, MD, FAPA 06/10/2021, 8:14 AM

## 2021-06-10 NOTE — Group Note (Unsigned)
Date:  06/10/2021 ?Time:  10:02 AM ? ?Group Topic/Focus:  ?Orientation:   The focus of this group is to educate the patient on the purpose and policies of crisis stabilization and provide a format to answer questions about their admission.  The group details unit policies and expectations of patients while admitted. ? ? ? ? ?Participation Level:  {BHH PARTICIPATION LEVEL:22264} ? ?Participation Quality:  {BHH PARTICIPATION QUALITY:22265} ? ?Affect:  {BHH AFFECT:22266} ? ?Cognitive:  {BHH COGNITIVE:22267} ? ?Insight: {BHH Insight2:20797} ? ?Engagement in Group:  {BHH ENGAGEMENT IN NHRVA:44584} ? ?Modes of Intervention:  {BHH MODES OF INTERVENTION:22269} ? ?Additional Comments:  *** ? ?Jerrye Beavers ?06/10/2021, 10:02 AM ? ?

## 2021-06-10 NOTE — Evaluation (Signed)
Physical Therapy Evaluation ?Patient Details ?Name: Casey Silva ?MRN: [id number] ?DOB: 12-28-1955 ?Today's Date: 06/10/2021 ? ?History of Present Illness ? Pt admit due to severe depression and SI thoughts. Pt states he has had a decline over past month with decreased motivation therefore decreased ability to perform basic tasks for himself and basic mobility making him more weak and with some falls. Pt's gait was very shuffled and unsteady on admission, however today seemed to be slowly improving. Pt lives in a boarding home ( apartment) with individual bedroom and bathroom, but shared kitchen, common area. PMH: anxiety, depression.  ?Clinical Impression ? Pt was very pleasant and interesting in participating and working with me today. During our session we walked the hallway down and back 3 times.Pt stated he is doing a lot better with this than 3 days ago. He stated he would just freeze and his feet would not move and his body would get ahead of his feet. He is doing better with walking, I did have to give cues for upright posture, foot clearance and bigger steps about ever 10 feet but to his posture would become flexed at the hips, and shuffled gait. This improved over time, however cues still needed. Worked with some balance exercises and will f/u with pt on Monday to see if he can continue with these .  ? ?For now I encourage out of room walking, and if staff see his flexed forward posture or shuffled steps. Ask him to stop and straighten up, and start over with bigger steps and upright posture.  ? ? ?Access Code [id number] Medbridge Exercises  ?   ?   ? ?Recommendations for follow up therapy are one component of a multi-disciplinary discharge planning process, led by the attending physician.  Recommendations may be updated based on patient status, additional functional criteria and insurance authorization. ? ?Follow Up Recommendations Home health PT (or some nursing support to see if he is following up with his  exercise plan , and meds and mobility is not decreasing) ? ?  ?Assistance Recommended at Discharge None  ?Patient can return home with the following ? A little help with walking and/or transfers ? ?  ?Equipment Recommendations    ?Recommendations for Other Services ?    ?  ?Functional Status Assessment Patient has had a recent decline in their functional status and demonstrates the ability to make significant improvements in function in a reasonable and predictable amount of time.  ? ?  ?Precautions / Restrictions Precautions ?Precautions: Fall ?Precaution Comments: pt reports he has recetnly falling due to his body getting ahead of his feet when walking. " my feet shuffle" ?Restrictions ?Weight Bearing Restrictions: No  ? ?  ? ?Mobility ? Bed Mobility ?Overal bed mobility: Independent ?  ?  ?  ?  ?  ?  ?  ?  ? ?Transfers ?Overall transfer level: Independent ?  ?  ?  ?  ?  ?  ?  ?  ?  ?  ? ?Ambulation/Gait ?Ambulation/Gait assistance: Supervision ?Gait Distance (Feet): 500 Feet (hallway length 3 times ( down and back)) ?Assistive device: None ?Gait Pattern/deviations: Step-through pattern, Shuffle ?  ?  ?Pre-gait activities: performed some exercises and activites for balance , and gait see exercise section ?General Gait Details: after about 10 feet had to give verbal cues for trunk extension to neutral due to increased forward flexion at hips and increased shuffled gait after abotu 10 feet. Cues for both required. Asked pt to stop , correct  both and start again with cues for bigger steps and to have foot clearance each time. ? ?Stairs ?  ?  ?  ?  ?  ? ?Wheelchair Mobility ?  ? ?Modified Rankin (Stroke Patients Only) ?  ? ?  ? ?Balance Overall balance assessment: Mild deficits observed, not formally tested ?  ?  ?  ?  ?  ?  ?  ?  ?  ?  ?  ?  ?  ?  ?  ?  ?  ?  ?   ? ? ? ?Pertinent Vitals/Pain Pain Assessment ?Pain Assessment: No/denies pain (however did state at times his back can hurt out of no where and down into  his hip areas as well . He states as a result now of his forward flexed posture)  ? ? ?Home Living Family/patient expects to be discharged to:: Private residence ?Living Arrangements: Alone ?  ?Type of Home: Apartment ?  ?  ?  ?  ?  ?  ?   ?  ?Prior Function Prior Level of Function : Independent/Modified Independent;Driving ?  ?  ?  ?  ?  ?  ?Mobility Comments: Pt states he has been doing fine with these things except for last few months things have become a lot harder and difficult . ?  ?  ? ? ?Hand Dominance  ?   ? ?  ?Extremity/Trunk Assessment  ?   ?  ? ?Lower Extremity Assessment ?Lower Extremity Assessment: Generalized weakness;Overall [facility] for tasks assessed ?  ? ?   ?Communication  ? Communication: No difficulties  ?Cognition Arousal/Alertness: Awake/alert ?Behavior During Therapy: [facility] for tasks assessed/performed ?Overall Cognitive Status: Within Functional Limits for tasks assessed ?  ?  ?  ?  ?  ?  ?  ?  ?  ?  ?  ?  ?  ?  ?  ?  ?General Comments: will differ higher level cognitive assessment to OT ?  ?  ? ?  ?General Comments General comments (skin integrity, edema, etc.): pt able to pick up object off floor, forward, side and backward step , however with hesitation and intention thought for perfomrnace. Will work on divided attention with task/ walking and other things to task balance and posture. ? ?  ?Exercises Other Exercises ?Other Exercises: all 10x or 20 feet each: marching in place alternating, toe raises and heel raises holding to wall railing, high stepping forward walking, side stepping R and L, standing against wall to encourage trunk to touch wall for upright posture while sliding arms up against wall to horizontal position. Pt unable to do this each time without self correcting. will attched medbridge HEP for reference.  ? ?Assessment/Plan  ?  ?PT Assessment Patient needs continued PT services  ?PT Problem List Decreased strength;Decreased activity tolerance;Decreased balance;Decreased  mobility ? ?   ?  ?PT Treatment Interventions Gait training;Functional mobility training;Therapeutic activities;Therapeutic exercise;Balance training   ? ?PT Goals (Current goals can be found in the Care Plan section)  ?Acute Rehab PT Goals ?Patient Stated Goal: Pt did not have a lot to say for goals. Was intersting in walking better and getting back on his feet like he used to ?PT Goal Formulation: With patient ?Time For Goal Achievement: 06/24/21 ?Potential to Achieve Goals: Good ?Additional Goals ?Additional Goal #1: To be able to complete teh 6 minute walk test with no LOB or no cues for trunk or shuffling. ? ?  ?Frequency Min 2X/week ?  ? ? ?Co-evaluation   ?  ?  ?  ?  ? ? ?  ?  AM-PAC PT "6 Clicks" Mobility  ?Outcome Measure Help needed turning from your back to your side while in a flat bed without using bedrails?: None ?Help needed moving from lying on your back to sitting on the side of a flat bed without using bedrails?: None ?Help needed moving to and from a bed to a chair (including a wheelchair)?: None ?Help needed standing up from a chair using your arms (e.g., wheelchair or bedside chair)?: None ?Help needed to walk in hospital room?: A Little ?Help needed climbing 3-5 steps with a railing? : A Little ?6 Click Score: 22 ? ?  ?End of Session   ?Activity Tolerance: Patient tolerated treatment well ?Patient left: in bed ?Nurse Communication: Mobility status ?PT Visit Diagnosis: Other abnormalities of gait and mobility (R26.89);Muscle weakness (generalized) (M62.81) ?  ? ?Time: 1130-1147 ?PT Time Calculation (min) (ACUTE ONLY): 17 min ? ? ?Charges:   PT Evaluation ?$PT Eval Low Complexity: 1 Low ?  ?  ?   ? ? ?Gatha Mayer, PT, MPT ?Acute Rehabilitation Services ?Office: [phone] ?Pager: [phone] ?06/10/2021 ? ? Clide Dales ?06/10/2021, 12:19 PM ? ?

## 2021-06-10 NOTE — Group Note (Signed)
[facility] LCSW Group Therapy Note ? ?Date/Time: 06/10/2021 at 1pm ? ?Type of Therapy and Topic:  Group Therapy:  Who Am I?  Self Esteem, Self-Actualization and Understanding Self. ? ?Participation Level:  Active ? ?Description of Group:   ? In this group patients will be asked to explore values, beliefs, truths, and morals as they relate to personal self.  Patients will be guided to discuss their thoughts, feelings, and behaviors related to what they identify as important to their true self. Patients will process together how values, beliefs and truths are connected to specific choices patients make every day. Each patient will be challenged to identify changes that they are motivated to make in order to improve self-esteem and self-actualization. This group will be process-oriented, with patients participating in exploration of their own experiences as well as giving and receiving support and challenge from other group members. ? ?Therapeutic Goals: ?Patient will identify false beliefs that currently interfere with their self-esteem.  ?Patient will identify feelings, thought process, and behaviors related to self and will become aware of the uniqueness of themselves and of others.  ?Patient will be able to identify and verbalize values, morals, and beliefs as they relate to self. ?Patient will begin to learn how to build self-esteem/self-awareness by expressing what is important and unique to them personally. ? ?Summary of Patient Progress: Patient participated appropriately in group. Patient identified that his mental and physical health is important to him.  Patient participated in discussion questions associated with his values.  He was open and receptive to feedback from peers.  ? ? ? ? ? ? ?Therapeutic Modalities:   ?Cognitive Behavioral Therapy ?Solution Focused Therapy ?Motivational Interviewing ?Brief Therapy ? ? ?Ladislaus Repsher, LCSW, LCAS ?Clincal Social Worker  ?[facility] ? ? ? ?

## 2021-06-11 MED ORDER — DICLOFENAC SODIUM 1 % EX GEL
2.0000 g | Freq: Four times a day (QID) | CUTANEOUS | Status: DC
  Administered 2021-06-12: 2 g via TOPICAL
  Filled 2021-06-11 (×2): qty 100

## 2021-06-11 MED ORDER — VITAMIN D (ERGOCALCIFEROL) 1.25 MG (50000 UNIT) PO CAPS
50000.0000 [IU] | ORAL_CAPSULE | ORAL | Status: DC
  Administered 2021-06-11 – 2021-06-18 (×2): 50000 [IU] via ORAL
  Filled 2021-06-11 (×3): qty 1

## 2021-06-11 MED ORDER — PROPRANOLOL HCL 10 MG PO TABS
10.0000 mg | ORAL_TABLET | Freq: Two times a day (BID) | ORAL | Status: DC
  Administered 2021-06-11 – 2021-06-20 (×18): 10 mg via ORAL
  Filled 2021-06-11 (×24): qty 1

## 2021-06-11 NOTE — [facility] Group Notes (Signed)
Pt attended and participated in orientation/goals group ?

## 2021-06-11 NOTE — Progress Notes (Signed)
?   06/11/21 2110  ?Psych Admission Type (Psych Patients Only)  ?Admission Status Voluntary  ?Psychosocial Assessment  ?Patient Complaints Anxiety  ?Eye Contact Brief  ?Facial Expression Flat;Blank  ?Affect Flat  ?Speech Logical/coherent;Soft  ?Interaction Guarded;Minimal  ?Motor Activity Slow  ?Appearance/Hygiene Body odor;In scrubs  ?Behavior Characteristics Cooperative;Appropriate to situation  ?Mood Depressed;Pleasant  ?Thought Process  ?Coherency WDL  ?Content WDL  ?Delusions None reported or observed  ?Perception WDL  ?Hallucination None reported or observed  ?Judgment Impaired  ?Confusion None  ?Danger to Self  ?Current suicidal ideation? Passive  ?Self-Injurious Behavior No self-injurious ideation or behavior indicators observed or expressed   ?Agreement Not to Harm Self Yes  ?Description of Agreement Verbal Contract  ?Danger to Others  ?Danger to Others None reported or observed  ? ? ?

## 2021-06-11 NOTE — Group Note (Signed)
LCSW Group Therapy Note ? ?06/11/2021   10:30-11:30am  ? ?Type of Therapy and Topic:  Group Therapy: Anger Cues and Responses ? ?Participation Level:  Active ? ? ?Description of Group:   ?In this group, patients learned how to recognize the physical, cognitive, emotional, and behavioral responses they have to anger-provoking situations.  They identified a recent time they became angry and how they reacted.  They analyzed how their reaction was possibly beneficial and how it was possibly unhelpful.  The group discussed a variety of healthier coping skills that could help with such a situation in the future.  Focus was placed on how helpful it is to recognize the underlying emotions to our anger, because working on those can lead to a more permanent solution as well as our ability to focus on the important rather than the urgent. ? ?Therapeutic Goals: ?Patients will remember their last incident of anger and how they felt emotionally and physically, what their thoughts were at the time, and how they behaved. ?Patients will identify how their behavior at that time worked for them, as well as how it worked against them. ?Patients will explore possible new behaviors to use in future anger situations. ?Patients will learn that anger itself is normal and cannot be eliminated, and that healthier reactions can assist with resolving conflict rather than worsening situations. ? ?Summary of Patient Progress:  The patient shared that his most recent time of anger was when his landlord did not fix something in his home in a timely manner and said his way of dealing with it was to continuously call him until it was fixed. The group explored  possible new behaviors to use in future anger situations. ? ?Therapeutic Modalities:   ?Cognitive Behavioral Therapy ? ?Read Drivers, LCSWA ?06/11/2021  1:35 PM   ? ?

## 2021-06-11 NOTE — [facility] Group Notes (Signed)
Goals Group ?3/4//2023 ? ? ?Group Focus: affirmation, clarity of thought, and goals/reality orientation ?Treatment Modality:  Psychoeducation ?Interventions utilized were assignment, group exercise, and support ?Purpose: To be able to understand and verbalize the reason for their admission to the hospital. To understand that the medication helps with their chemical imbalance but they also need to work on their choices in life. To be challenged to develop Silva list of 30 positives about themselves. Also introduce the concept that "feelings" are not reality. ? ?Participation Level:  Adid not attend ? ?Casey Silva ?

## 2021-06-11 NOTE — Progress Notes (Signed)
D- Patient alert and oriented. Patient Affect/mood Denies SI, HI, AVH, and pain. Patient Goal: " Rest".  ? ?A- Scheduled medications administered to patient, per MD orders. Support and encouragement provided.  Routine safety checks conducted every 15 minutes.  Patient informed to notify staff with problems or concerns. ? ?R- No adverse drug reactions noted. Patient contracts for safety at this time. Patient compliant with medications and treatment plan. Patient receptive, calm, and cooperative. Patient interacts well with others on the unit.  Patient remains safe at this time.  ?

## 2021-06-11 NOTE — Progress Notes (Signed)
?   06/10/21 2123  ?Psych Admission Type (Psych Patients Only)  ?Admission Status Voluntary  ?Psychosocial Assessment  ?Patient Complaints Anxiety;Sadness;Depression  ?Eye Contact Brief  ?Facial Expression Flat;Blank  ?Affect Flat  ?Speech Logical/coherent;Soft  ?Interaction Guarded;Minimal  ?Motor Activity Slow  ?Appearance/Hygiene Disheveled;Body odor  ?Behavior Characteristics Cooperative;Appropriate to situation  ?Mood Depressed;Anxious  ?Thought Process  ?Coherency WDL  ?Content WDL  ?Delusions None reported or observed  ?Perception WDL  ?Hallucination None reported or observed  ?Judgment Impaired  ?Confusion None  ?Danger to Self  ?Current suicidal ideation? Passive  ?Self-Injurious Behavior No self-injurious ideation or behavior indicators observed or expressed   ?Agreement Not to Harm Self Yes  ?Description of Agreement Verbal Contract  ?Danger to Others  ?Danger to Others None reported or observed  ? ? ?

## 2021-06-11 NOTE — Plan of Care (Signed)
  Problem: Education: Goal: Emotional status will improve Outcome: Progressing Goal: Mental status will improve Outcome: Progressing   

## 2021-06-11 NOTE — Progress Notes (Addendum)
[facility] MD Progress Note  06/11/2021 2:57 PM Casey Silva  MRN:  [id number]  Subjective:  Casey Silva is a 66 y.o. male with a history of MDD with psychotic features and GAD, who was initially admitted for inpatient psychiatric hospitalization on 06/07/2021 for management of self-care deficits, worsening depression and SI with thoughts of stabbing himself. The patient is currently on Hospital Day 3.   Case was discussed in the multidisciplinary team. MAR was reviewed and patient was compliant with medications.  He did not require any PRN's for agitation. Per staff patient continued to spend yesterday in his room and did not attend groups or have much interaction.     Psychiatric Team made the following recommendations yesterday:  -Discontinued Risperdal (3/2) -Continue Zyprexa 5 mg nightly for psychosis with close monitoring of QTC - Continue Effexor XR 37.'5mg'$  and monitor Na+ closely -Continue Remeron 30 mg nightly  -Reduce Hydroxyzine to 25 mg every 6 hours PRN anxiety due to daytime sedation (monitoring QTC) - Prompting to attended to ADLs and to eat with PT and OT consults pending - Will need MOCA when he can cooperate for testing - Continue sitter for meals to encourage to eat and will order room lock out for meals and groups -Continue Trazodone 100 mg nightly (monitoring QTC) -Continue Melatonin 5 mg nightly  - Improved sleep hygiene encouraged  On assessment today patient reports that he is "doing poorly." Patient reports that he wishes he was dead, but then endorses that being in the hospital is a "good thing." Patient reports that the reason he endorses SI is because he believes that "my life is just suffering" and endorses that he has been feeling like this for the last 2 years. Patient reports he has 2 sisters one in [place] and one in [place], who he does not speak to often, but endorses this is due to him not really talking much when they do call. Patient reports that he feels that he  is "old and gotten nowhere in life." Patient endorses that it is difficult to think about positives in his life. Patient and provider briefly discuss this and patient is given the task of writing down one positive in his life, to talk about tom. Patient reports that otherwise his sleep is "so-so" and he is eating. Patient reports that he is no longer having AVH and denies HI.   Objectively patient appears disheveled, and upset about being locked out his room. Patient is noticed to be restless constantly pacing. Patient appears to be learning in groups and is able to talk about the groups he has attended today.   Principal Problem: MDD (major depressive disorder), recurrent, severe, with psychosis ([place]) Diagnosis: Principal Problem:   MDD (major depressive disorder), recurrent, severe, with psychosis ([place]) Active Problems:   MDD (major depressive disorder), recurrent episode, severe ([place])  Total Time Spent in Direct Patient Care:  I personally spent 25 minutes on the unit in direct patient care. The direct patient care time included face-to-face time with the patient, reviewing the patient's chart, communicating with other professionals, and coordinating care. Greater than 50% of this time was spent in counseling or coordinating care with the patient regarding goals of hospitalization, psycho-education, and discharge planning needs.   Past Psychiatric History: See H&P  Past Medical History:  Past Medical History:  Diagnosis Date   Anxiety    Asthma    Depression    History reviewed. No pertinent surgical history. Family History:  Family History  Problem  Relation Age of Onset   Bipolar disorder Mother    Family Psychiatric  History: See H&P Social History:  Social History   Substance and Sexual Activity  Alcohol Use No     Social History   Substance and Sexual Activity  Drug Use No    Social History   Socioeconomic History   Marital status: Single    Spouse name: Not on file    Number of children: 1   Years of education: Not on file   Highest education level: GED or equivalent  Occupational History   Not on file  Tobacco Use   Smoking status: Never   Smokeless tobacco: Never  Vaping Use   Vaping Use: Unknown  Substance and Sexual Activity   Alcohol use: No   Drug use: No   Sexual activity: Not Currently  Other Topics Concern   Not on file  Social History Narrative   Marital status:  Single   Children: Daughter   Employment:  Unemployment. previous work for [employer].   Alcohol:  None   Drugs:  none   Exercise:  Regularly very   Religion:  Jewish   Social Determinants of Radio broadcast assistant Strain: Not on file  Food Insecurity: Not on file  Transportation Needs: Not on file  Physical Activity: Not on file  Stress: Not on file  Social Connections: Not on file   Additional Social History:                         Sleep: Fair  Appetite:  Good  Current Medications: Current Facility-Administered Medications  Medication Dose Route Frequency Provider Last Rate Last Admin   acetaminophen (TYLENOL) tablet 650 mg  650 mg Oral Q6H PRN Suella Broad, FNP   650 mg at 06/11/21 1430   alum & mag hydroxide-simeth (MAALOX/MYLANTA) 200-200-20 MG/5ML suspension 30 mL  30 mL Oral Q4H PRN Starkes-Perry, Gayland Curry, FNP       amLODipine (NORVASC) tablet 10 mg  10 mg Oral Daily Nicholes Rough, NP   10 mg at 06/11/21 0825   diclofenac Sodium (VOLTAREN) 1 % topical gel 2 g  2 g Topical QID Damita Dunnings B, MD       feeding supplement (ENSURE ENLIVE / ENSURE PLUS) liquid 237 mL  237 mL Oral BID BM Nkwenti, Doris, NP   237 mL at 06/11/21 0826   hydrOXYzine (ATARAX) tablet 25 mg  25 mg Oral Q6H PRN Nelda Marseille, Zionah Criswell E, MD   25 mg at 06/11/21 1430   influenza vac split quadrivalent PF (FLUARIX) injection 0.5 mL  0.5 mL Intramuscular Tomorrow-1000 Nkwenti, Doris, NP       magnesium hydroxide (MILK OF MAGNESIA) suspension 30 mL  30 mL Oral Daily  PRN Suella Broad, FNP   30 mL at 06/09/21 1611   melatonin tablet 5 mg  5 mg Oral QHS Suella Broad, FNP   5 mg at 06/10/21 2123   mirtazapine (REMERON) tablet 30 mg  30 mg Oral QHS Suella Broad, FNP   30 mg at 06/10/21 2123   multivitamin with minerals tablet 1 tablet  1 tablet Oral Daily Harlow Asa, MD   1 tablet at 06/11/21 0859   OLANZapine (ZYPREXA) tablet 5 mg  5 mg Oral QHS Nkwenti, Doris, NP   5 mg at 06/10/21 2123   traZODone (DESYREL) tablet 100 mg  100 mg Oral QHS Nicholes Rough, NP  100 mg at 06/10/21 2123   venlafaxine XR (EFFEXOR-XR) 24 hr capsule 37.5 mg  37.5 mg Oral Q breakfast Daquane Aguilar E, MD   37.5 mg at 06/11/21 0900    Lab Results:  Results for orders placed or performed during the hospital encounter of 06/07/21 (from the past 48 hour(s))  Vitamin B12     Status: None   Collection Time: 06/10/21  6:58 PM  Result Value Ref Range   Vitamin B-12 325 180 - 914 pg/mL    Comment: (NOTE) This assay is not validated for testing neonatal or myeloproliferative syndrome specimens for Vitamin B12 levels. Performed at [facility], [place] [address] [place], [place] [address]   VITAMIN D 25 Hydroxy (Vit-D Deficiency, Fractures)     Status: Abnormal   Collection Time: 06/10/21  6:58 PM  Result Value Ref Range   Vit D, 25-Hydroxy 14.94 (L) 30 - 100 ng/mL    Comment: (NOTE) Vitamin D deficiency has been defined by the [place] practice guideline as a level of serum 25-OH  vitamin D less than 20 ng/mL (1,2). The Endocrine Society went on to  further define vitamin D insufficiency as a level between 21 and 29  ng/mL (2).  1. IOM (Institute of Medicine). 2010. Dietary reference intakes for  calcium and D. [place]: The [employer]. 2. Holick MF, Binkley [place], Bischoff-Ferrari HA, et al. Evaluation,  treatment, and prevention of vitamin D deficiency: an Endocrine   Society clinical practice guideline, JCEM. 2011 Jul; 96(7): 1911-30.  Performed at [facility], [place] [address], [place], [place] [address]   Folate, serum, performed at [facility] lab     Status: None   Collection Time: 06/10/21  6:58 PM  Result Value Ref Range   Folate 9.8 >5.9 ng/mL    Comment: Performed at [facility], [place] [address] [place], [place] [address]    Blood Alcohol level:  Lab Results  Component Value Date   ETH <10 06/07/2021   ETH <10 42/70/6237    Metabolic Disorder Labs: Lab Results  Component Value Date   HGBA1C 5.1 06/07/2021   MPG 99.67 06/07/2021   MPG 111.15 11/24/2020   No results found for: PROLACTIN Lab Results  Component Value Date   CHOL 197 06/07/2021   TRIG 149 06/07/2021   HDL 44 06/07/2021   CHOLHDL 4.5 06/07/2021   VLDL 30 06/07/2021   LDLCALC 123 (H) 06/07/2021   LDLCALC 148 (H) 11/24/2020    Physical Findings: AIMS: Facial and Oral Movements Muscles of Facial Expression: None, normal Lips and Perioral Area: None, normal Jaw: None, normal Tongue: None, normal,Extremity Movements Upper (arms, wrists, hands, fingers): None, normal Lower (legs, knees, ankles, toes): None, normal, Trunk Movements Neck, shoulders, hips: None, normal, Overall Severity Severity of abnormal movements (highest score from questions above): None, normal Incapacitation due to abnormal movements: None, normal Patient's awareness of abnormal movements (rate only patient's report): No Awareness, Dental Status Current problems with teeth and/or dentures?: No Does patient usually wear dentures?: No  CIWA:    COWS:     Musculoskeletal: Strength & Muscle Tone: within normal limits Gait & Station: shuffling Patient leans: forward  Psychiatric Specialty Exam:  Presentation  General Appearance: Disheveled appearing, in scrubs  Eye Contact:Fair  Speech:Slow  Speech Volume:Normal  Handedness:Right   Mood and Affect   Mood:anxious, Hopeless ("very poorly")  Affect:anxious   Thought Process  Thought Processes:Ruminative about  anxiety, mostly linear  Descriptions of Associations:Intact   Orientation:Full (Time, Place and Person)  Thought Content:Reports passive SI but denies HI; denies AVH, ideas of reference, paranoia or first rank symptoms  History of Schizophrenia/Schizoaffective disorder:No  Duration of Psychotic Symptoms:Less than six months  Hallucinations:Hallucinations: None  Ideas of Reference:None  Suicidal Thoughts:Suicidal Thoughts: Yes, Passive SI Active Intent and/or Plan: Without Intent  Homicidal Thoughts:Homicidal Thoughts: No   Sensorium  Memory:Immediate Fair; Recent Fair  Judgment:Impaired  Insight:None   Executive Functions  Concentration:Fair  Attention Span:Fair  [place]   Psychomotor Activity  Psychomotor Activity:Pacing, restless    Assets  Assets:Resilience   Sleep  5.75 hours   Physical Exam HENT:     Head: Normocephalic and atraumatic.  Pulmonary:     Effort: Pulmonary effort is normal.  Neurological:     General: No focal deficit present.     Mental Status: He is alert.   Review of Systems  Musculoskeletal:  Positive for back pain.  Psychiatric/Behavioral:  Positive for depression and suicidal ideas. Negative for hallucinations.   Blood pressure 115/82, pulse 94, temperature 97.6 F (36.4 C), temperature source Oral, resp. rate 18, height 5' 6.54" (1.69 m), weight 71.8 kg, SpO2 100 %. Body mass index is 25.13 kg/m.   Treatment Plan Summary: Daily contact with patient to assess and evaluate symptoms and progress in treatment and Medication management Demondre Aguas is a 66 y.o. male with a history of MDD with psychotic features and GAD, who was initially admitted for inpatient psychiatric hospitalization on 06/07/2021 for management of self-care deficits, worsening depression and SI  with thoughts of stabbing himself.  Patient is denying AVH today; however he complains of restlessness. Patient has been locked out of his room today and noted to pacing and standing in groups. Patient may be having [place]; however he was not endorsing restlessness until he was locked out of his room; therefore will continue to monitor this. Patient may be endorsing anxiety that is displaying as restlessness, at this time patient BP and HR could handle propanolol for anxiety.    MDD recurrent severe with psychotic features GAD by hx   PLAN: Safety and Monitoring:             -- Voluntary admission to inpatient psychiatric unit for safety, stabilization and treatment             -- Daily contact with patient to assess and evaluate symptoms and progress in treatment             -- Patient's case to be discussed in multi-disciplinary team meeting             -- Observation Level : q15 minute checks             -- Vital signs:  q12 hours             -- Precautions: suicide, elopement, and assault   2. Psychiatric Diagnoses and Treatment:              MDD recurrent severe with psychotic features GAD by hx -Continue Zyprexa 5 mg nightly for psychosis with close monitoring of QTC - Continue Effexor XR 37.'5mg'$  and monitor Na+ closely -Continue Remeron 30 mg nightly  -Continue Hydroxyzine 25 mg every 6 hours PRN anxiety (monitoring QTC) - Start Inderal '10mg'$  BID, for anxiety - Prompting to attended to ADLs and to eat  - Will need MOCA when he can cooperate for testing -  Continue room lock out for meals and groups             -- Metabolic profile and EKG monitoring obtained while on an atypical antipsychotic (Lipid Panel: WNL except for LDL 123; HbgA1c:5.1; QTc:448m, B12- WNL, Vit D- 14.94, Folate- WNL) Rechecking EKG for QTC monitoring on present meds             -- Encouraged patient to participate in unit milieu and in scheduled group therapies                Insomnia -Continue Trazodone 100  mg nightly (monitoring QTC) -Continue Melatonin 5 mg nightly  - Improved sleep hygiene encouraged                          3. Medical Issues Being Addressed:              Hyponatremia - Trending BMP for monitoring - rechecking BMP 3/5 - Hope will correct as po intake improves   Prolonged QTC - resolved - EKG shows QTC 4626m- will continue to monitor. Will repeat EKG today for trending               HTN             -- Continue Norvasc '10mg'$  daily   Low Vit D  -- Start Vit D 50,000 weekly   Self-care deficits - PT and OT consults obtained for ambulation and assessment for ability to function independently after discharge (PT helping with posture and gait and OT feels he has physical and cognitive abilities to function independently but has psychological challenges limiting his perfomance in IADLs. - Ensure tid and po intake encouraged - MVI ordered daily - Checking MOCA when he will comply  - SW to place APS referral  Back Pain - Volatren gel PRN for back pain and encouraged to be OOB   4. Discharge Planning:              -- Social work and case management to assist with discharge planning and identification of hospital follow-up needs prior to discharge             -- Estimated LOS: 7-10 days             -- Discharge Concerns: Need to establish a safety plan; Medication compliance and effectiveness             -- Discharge Goals: Return home with outpatient referrals for mental health follow-up including medication management/psychotherapy  PGY-2 JaFreida BusmanMD 06/11/2021, 2:57 PM

## 2021-06-11 NOTE — BHH Group Notes (Signed)
Psychoeducational Group Note ? ? ? ?Date:06/11/21 ?Time: 1300-1400 ? ? ? ?Purpose of Group: . The group focus' on teaching patients on how to identify their needs and their Life Skills:  A group where two lists are made. What people need and what are things that we do that are unhealthy. The lists are developed by the patients and it is explained that we often do the actions that are not healthy to get our list of needs met. ? ?Goal:: to develop the coping skills needed to get their needs met ? ?Participation Level:  Active ? ?Participation Quality:  Appropriate ? ?Affect:  Appropriate ? ?Cognitive:  Oriented ? ?Insight:  Improving ? ?Engagement in Group:  Engaged ? ?Additional Comments: Pt rates his energy at a 2/10. Did come to the group and did participate, adding to the lists ? ?[name] ? ?

## 2021-06-12 LAB — BASIC METABOLIC PANEL
Anion gap: 6 (ref 5–15)
BUN: 14 mg/dL (ref 8–23)
CO2: 28 mmol/L (ref 22–32)
Calcium: 8.9 mg/dL (ref 8.9–10.3)
Chloride: 99 mmol/L (ref 98–111)
Creatinine, Ser: 1.03 mg/dL (ref 0.61–1.24)
GFR, Estimated: >60 mL/min (ref >60)
Glucose, Bld: 94 mg/dL (ref 70–99)
Potassium: 3.6 mmol/L (ref 3.5–5.1)
Sodium: 133 mmol/L — ABNORMAL LOW (ref 135–145)

## 2021-06-12 NOTE — BHH Counselor (Signed)
Clinical Social Work Note ? ?CSW did Adult Scientist, forensic (APS) report to [place]. ? ?If the reported is screened in, they will be in touch with weekday social work staff and come to visit the patient. ? ?Selmer Dominion, LCSW ?06/12/2021, 11:30 AM ?  ?

## 2021-06-12 NOTE — Progress Notes (Signed)
Pt endorses passive SI but no plan in place.  Pt denies HI, AVH.  RN performed pt's  EKG. Pt agreed to Voltaren gel at lunchtime.  Pt is willingly going to groups and does not require room lockout.  VSS.  Pt appears to be in no acute distress. ?

## 2021-06-12 NOTE — Progress Notes (Signed)
The focus of this group is to help patients review their daily goal of treatment and discuss progress on daily workbooks. ? ?Pt attended the evening group and responded to all discussion prompts from the [place]. Pt shared that today was a good day on the unit, the highlight of which was a particularly good group earlier in the day about relaxation techniques. ? ?Pt told that their goal for the coming week was to "figure out my medications" and ultimately "discharge home." ? ?Casey Silva rated his day a 7 out of 10 and he appeared anxious in group, which he admitted feeling. Despite that, Pt remained in group for the full duration and participated fully. ?

## 2021-06-12 NOTE — B[facility] Group Notes (Signed)
Adult Psychoeducational Group Not ?Date:  06/12/2021 ?Time:  5038-8828 ?Group Topic/Focus: PROGRESSIVE RELAXATION. A group where deep breathing is taught and tensing and relaxation muscle groups is used. Imagery is used as well.  Pts are asked to imagine 3 pillars that hold them up when they are not able to hold themselves up and to share that with the group. ? ?Participation Level:  Active ? ?Participation Quality:  Appropriate ? ?Affect:  Appropriate ? ?Cognitive:  Oriented ? ?Insight: Improving ? ?Engagement in Group:  Engaged ? ?Modes of Intervention:  Activity, Discussion, Education, and Support ? ?Additional Comments:  Rates his energy at a 2/10. Music, himself and rest hold him up. ? ?Casey Silva A ? ? ?

## 2021-06-12 NOTE — Progress Notes (Addendum)
[facility] MD Progress Note  06/12/2021 11:56 AM Casey Silva  MRN:  [id number]  Subjective:  Casey Silva is a 66 y.o. male with a history of MDD with psychotic features and GAD, who was initially admitted for inpatient psychiatric hospitalization on 06/07/2021 for management of self-care deficits, worsening depression and SI with thoughts of stabbing himself. The patient is currently on Hospital Day 5.   Case was discussed in the multidisciplinary team. MAR was reviewed and patient was compliant with medications.  He did not require any PRN's for agitation. He was compliant with his room lock out attempting to attend groups. Per staff patient was noted to be very anxious in groups and relieved when he was pulled from groups to do have other assessments done. Patient was also noted to pace the halls all day.  Today, patient did not require room lock out and willingly went to group when called. Patient was also noted to not pace the halls as much and was interactive in group therapy. Interestingly patient was not noted to be restless during his [place].   On assessment patient reports that he slept "better"  and is eating well. Patient reports that his anxiety is "high" today. Patient reports that he also continues to have passive SI. Patient denies HI and AVH as well as paranoid thoughts. Patient reports that he thought about his assignment and maintains that one good thing in his life is the [facility]. Patient endorses that he likes [facility] because he brought himself "when I was in trouble." Patient reports that out of his hospitalization he hopes to get medication mgmt and get his energy back. Patient reports that he is no longer having back pain this AM.   Principal Problem: MDD (major depressive disorder), recurrent, severe, with psychosis ([place]) Diagnosis: Principal Problem:   MDD (major depressive disorder), recurrent, severe, with psychosis ([place]) Active Problems:   MDD (major depressive disorder), recurrent episode,  severe ([place])  Total Time spent with patient: I personally spent 25 minutes on the unit in direct patient care. The direct patient care time included face-to-face time with the patient, reviewing the patient's chart, communicating with other professionals, and coordinating care. Greater than 50% of this time was spent in counseling or coordinating care with the patient regarding goals of hospitalization, psycho-education, and discharge planning needs.  Past Psychiatric History:  See H&P  Past Medical History:  Past Medical History:  Diagnosis Date   Anxiety    Asthma    Depression    History reviewed. No pertinent surgical history. Family History:  Family History  Problem Relation Age of Onset   Bipolar disorder Mother    Family Psychiatric  History: See H&P Social History:  Social History   Substance and Sexual Activity  Alcohol Use No     Social History   Substance and Sexual Activity  Drug Use No    Social History   Socioeconomic History   Marital status: Single    Spouse name: Not on file   Number of children: 1   Years of education: Not on file   Highest education level: GED or equivalent  Occupational History   Not on file  Tobacco Use   Smoking status: Never   Smokeless tobacco: Never  Vaping Use   Vaping Use: Unknown  Substance and Sexual Activity   Alcohol use: No   Drug use: No   Sexual activity: Not Currently  Other Topics Concern   Not on file  Social History Narrative  Marital status:  Single   Children: Daughter   Employment:  Unemployment. previous work for [employer].   Alcohol:  None   Drugs:  none   Exercise:  Regularly very   Religion:  Jewish   Social Determinants of Radio broadcast assistant Strain: Not on file  Food Insecurity: Not on file  Transportation Needs: Not on file  Physical Activity: Not on file  Stress: Not on file  Social Connections: Not on file   Additional Social History:                          Sleep: Good  Appetite:  Good  Current Medications: Current Facility-Administered Medications  Medication Dose Route Frequency Provider Last Rate Last Admin   acetaminophen (TYLENOL) tablet 650 mg  650 mg Oral Q6H PRN Suella Broad, FNP   650 mg at 06/11/21 1430   alum & mag hydroxide-simeth (MAALOX/MYLANTA) 200-200-20 MG/5ML suspension 30 mL  30 mL Oral Q4H PRN Starkes-Perry, Gayland Curry, FNP       amLODipine (NORVASC) tablet 10 mg  10 mg Oral Daily Nicholes Rough, NP   10 mg at 06/12/21 0756   diclofenac Sodium (VOLTAREN) 1 % topical gel 2 g  2 g Topical QID Damita Dunnings B, MD       feeding supplement (ENSURE ENLIVE / ENSURE PLUS) liquid 237 mL  237 mL Oral BID BM Nkwenti, Doris, NP   237 mL at 06/12/21 1003   hydrOXYzine (ATARAX) tablet 25 mg  25 mg Oral Q6H PRN Nelda Marseille, Marlise Fahr E, MD   25 mg at 06/11/21 1430   influenza vac split quadrivalent PF (FLUARIX) injection 0.5 mL  0.5 mL Intramuscular Tomorrow-1000 Nkwenti, Doris, NP       magnesium hydroxide (MILK OF MAGNESIA) suspension 30 mL  30 mL Oral Daily PRN Suella Broad, FNP   30 mL at 06/09/21 1611   melatonin tablet 5 mg  5 mg Oral QHS Suella Broad, FNP   5 mg at 06/11/21 2110   mirtazapine (REMERON) tablet 30 mg  30 mg Oral QHS Suella Broad, FNP   30 mg at 06/11/21 2110   multivitamin with minerals tablet 1 tablet  1 tablet Oral Daily Viann Fish E, MD   1 tablet at 06/12/21 0756   OLANZapine (ZYPREXA) tablet 5 mg  5 mg Oral QHS Nkwenti, Doris, NP   5 mg at 06/11/21 2110   propranolol (INDERAL) tablet 10 mg  10 mg Oral BID Damita Dunnings B, MD   10 mg at 06/12/21 0756   traZODone (DESYREL) tablet 100 mg  100 mg Oral QHS Nkwenti, Doris, NP   100 mg at 06/11/21 2110   venlafaxine XR (EFFEXOR-XR) 24 hr capsule 37.5 mg  37.5 mg Oral Q breakfast Nelda Marseille, Kenta Laster E, MD   37.5 mg at 06/12/21 0756   Vitamin D (Ergocalciferol) (DRISDOL) capsule 50,000 Units  50,000 Units Oral Q7 days Harlow Asa, MD    50,000 Units at 06/11/21 1926    Lab Results:  Results for orders placed or performed during the hospital encounter of 06/07/21 (from the past 48 hour(s))  Vitamin B12     Status: None   Collection Time: 06/10/21  6:58 PM  Result Value Ref Range   Vitamin B-12 325 180 - 914 pg/mL    Comment: (NOTE) This assay is not validated for testing neonatal or myeloproliferative syndrome specimens for Vitamin B12 levels. Performed at  [facility], [place] [address], [place], [place] [address]   VITAMIN D 25 Hydroxy (Vit-D Deficiency, Fractures)     Status: Abnormal   Collection Time: 06/10/21  6:58 PM  Result Value Ref Range   Vit D, 25-Hydroxy 14.94 (L) 30 - 100 ng/mL    Comment: (NOTE) Vitamin D deficiency has been defined by the [place] practice guideline as a level of serum 25-OH  vitamin D less than 20 ng/mL (1,2). The Endocrine Society went on to  further define vitamin D insufficiency as a level between 21 and 29  ng/mL (2).  1. IOM (Institute of Medicine). 2010. Dietary reference intakes for  calcium and D. [place]: The [employer]. 2. Holick MF, Binkley [place], Bischoff-Ferrari HA, et al. Evaluation,  treatment, and prevention of vitamin D deficiency: an Endocrine  Society clinical practice guideline, JCEM. 2011 Jul; 96(7): 1911-30.  Performed at [facility], [place] [address] [place], [place] [address]   Folate, serum, performed at [facility] lab     Status: None   Collection Time: 06/10/21  6:58 PM  Result Value Ref Range   Folate 9.8 >5.9 ng/mL    Comment: Performed at [facility], [place] [address], [place], [place] [address]  Basic metabolic panel     Status: Abnormal   Collection Time: 06/12/21  6:40 AM  Result Value Ref Range   Sodium 133 (L) 135 - 145 mmol/L   Potassium 3.6 3.5 - 5.1 mmol/L   Chloride 99 98 - 111 mmol/L   CO2 28 22 - 32 mmol/L   Glucose, Bld 94 70 - 99  mg/dL    Comment: Glucose reference range applies only to samples taken after fasting for at least 8 hours.   BUN 14 8 - 23 mg/dL   Creatinine, Ser 1.03 0.61 - 1.24 mg/dL   Calcium 8.9 8.9 - 10.3 mg/dL   GFR, Estimated >60 >60 mL/min    Comment: (NOTE) Calculated using the CKD-EPI Creatinine Equation (2021)    Anion gap 6 5 - 15    Comment: Performed at [facility], [place] [address] [place], [place] [address]    Blood Alcohol level:  Lab Results  Component Value Date   ETH <10 06/07/2021   ETH <10 27/51/7001    Metabolic Disorder Labs: Lab Results  Component Value Date   HGBA1C 5.1 06/07/2021   MPG 99.67 06/07/2021   MPG 111.15 11/24/2020   No results found for: PROLACTIN Lab Results  Component Value Date   CHOL 197 06/07/2021   TRIG 149 06/07/2021   HDL 44 06/07/2021   CHOLHDL 4.5 06/07/2021   VLDL 30 06/07/2021   LDLCALC 123 (H) 06/07/2021   LDLCALC 148 (H) 11/24/2020    Physical Findings: AIMS: Facial and Oral Movements Muscles of Facial Expression: None, normal Lips and Perioral Area: None, normal Jaw: None, normal Tongue: None, normal,Extremity Movements Upper (arms, wrists, hands, fingers): None, normal Lower (legs, knees, ankles, toes): None, normal, Trunk Movements Neck, shoulders, hips: None, normal, Overall Severity Severity of abnormal movements (highest score from questions above): None, normal Incapacitation due to abnormal movements: None, normal Patient's awareness of abnormal movements (rate only patient's report): No Awareness, Dental Status Current problems with teeth and/or dentures?: No Does patient usually wear dentures?: No   Musculoskeletal: Strength & Muscle Tone: within normal limits Gait & Station: shuffle Patient leans: Front  Psychiatric Specialty Exam:  Presentation  General Appearance: disheveled in scrubs, hair uncombed  Eye Contact:Fair  Speech:slow, normal fluency  Speech  Volume:Normal  Handedness:Right   Mood and Affect  Mood: Described as anxious  Affect:anxious, restricted   Thought Process  Thought Processes:Linear  Orientation:Full (Time, Place and Person)  Thought Content:Denies AVH, paranoia, ideas of reference, first rank symptoms, or HI; reports passive SI to [employer] but denies SI with attending  History of Schizophrenia/Schizoaffective disorder:No  Duration of Psychotic Symptoms:Less than six months  Hallucinations:Hallucinations: None  Ideas of Reference:None  Suicidal Thoughts:Suicidal Thoughts: Yes, Passive SI Active Intent and/or Plan: Without Intent  Homicidal Thoughts:Homicidal Thoughts: No   Sensorium  Memory:Immediate Fair; Recent Fair  Judgment:Impaired  Insight:None   Executive Functions  Concentration:Fair  Attention Span:Fair  [place]   Psychomotor Activity  Psychomotor Activity:Psychomotor Activity: Restlessness   Assets  Assets:Resilience   Sleep  6 hours  MOCA: Visuospatial/ Executive: 4/5 (no cube) Naming: 3/3 Memory: good Attention: 5/6 ( 3 answers on Serial 7's) Language: 2/3( only 7 "F" words) Abstraction 2/2 Delayed Recall: 5/5 Orientation: 6/6 11th grade education + 1 =28/30  Physical Exam HENT:     Head: Normocephalic and atraumatic.  Pulmonary:     Effort: Pulmonary effort is normal.  Neurological:     Mental Status: He is alert and oriented to person, place, and time.   Review of Systems  Cardiovascular:  Negative for chest pain.  Musculoskeletal:  Negative for back pain.  Neurological:  Negative for headaches.  Psychiatric/Behavioral:  Positive for suicidal ideas. Negative for hallucinations. The patient does not have insomnia.        Passive SI  Blood pressure (!) 128/93, pulse 84, temperature 97.8 F (36.6 C), temperature source Oral, resp. rate 18, height 5' 6.54" (1.69 m), weight 71.8 kg, SpO2 99 %. Body mass  index is 25.13 kg/m.   Treatment Plan Summary: Daily contact with patient to assess and evaluate symptoms and progress in treatment and Medication management  Patient looks, objectively better than yesterday. He is still unkempt but he is no longer isolative and appears to be interacting in group. Patient also did very well on his [place] indicating that he likely has the capacity to benefit from group therapy and for a better quality of life. It further suggests that patient's appearance and outlook are 2/2 depression. Patient appears to be benefiting from his medication as he is clearly showing some improvement; however he continues to have poor hygiene and residual cognitive distortions. Despite patient endorsing he still has high anxiety he appears to have benefited from the Inderal as objectively he appears less anxious and more focused.    MDD recurrent severe with psychotic features GAD by hx   PLAN: Safety and Monitoring:             -- Voluntary admission to inpatient psychiatric unit for safety, stabilization and treatment             -- Daily contact with patient to assess and evaluate symptoms and progress in treatment             -- Patient's case to be discussed in multi-disciplinary team meeting             -- Observation Level : q15 minute checks             -- Vital signs:  q12 hours             -- Precautions: suicide, elopement, and  assault   2. Psychiatric Diagnoses and Treatment:              MDD recurrent severe with psychotic features GAD by hx -Continue Zyprexa 5 mg nightly for psychosis (monitoring of QTC) - Continue Effexor XR 37.'5mg'$  and monitor Na+ closely -Continue Remeron 30 mg nightly  -Continue Hydroxyzine 25 mg every 6 hours PRN anxiety (monitoring QTC) - Continue Inderal '10mg'$  BID, for anxiety - Prompting to attended to ADLs and to eat  - MOCA 28/30 - Continue room lock out for meals and groups             -- Metabolic profile and EKG monitoring obtained  while on an atypical antipsychotic (Lipid Panel: WNL except for LDL 123; HbgA1c:5.1; QTc:438m, B12- WNL, Vit D- 14.94, Folate- WNL) Repeat EKG- 463 (3/5)             -- Encouraged patient to participate in unit milieu and in scheduled group therapies                Insomnia -Continue Trazodone 100 mg nightly (monitoring QTC) -Continue Melatonin 5 mg nightly  - Improved sleep hygiene encouraged                          3. Medical Issues Being Addressed:              Hyponatremia - Trending BMP for monitoring, Na+ 133 (3/5) - Hope will correct as po intake improves, thus far improving   Prolonged QTC - resolved - EKG shows QTC 4666m 463 on repeat               HTN             -- Continue Norvasc '10mg'$  daily               Low Vit D             -- Continue Vit D 50,000 weekly   Self-care deficits - PT and OT consults obtained for ambulation and assessment for ability to function independently after discharge (PT helping with posture and gait and OT feels he has physical and cognitive abilities to function independently but has psychological challenges limiting his perfomance in IADLs. - Ensure tid and po intake encouraged - MVI ordered daily - SW placed APS referral, 3/5   Back Pain - Volatren gel PRN for back pain and encouraged to be OOB   4. Discharge Planning:              -- Social work and case management to assist with discharge planning and identification of hospital follow-up needs prior to discharge             -- Estimated LOS: 7-10 days             -- Discharge Concerns: Need to establish a safety plan; Medication compliance and effectiveness             -- Discharge Goals: Return home with outpatient referrals for mental health follow-up including medication management/psychotherapy   PGY-2 JaFreida BusmanMD 06/12/2021, 11:56 AM

## 2021-06-12 NOTE — Group Note (Signed)
?  [facility] LCSW Group Therapy Note ? ?Date/Time:  06/12/2021 10:00AM-11:00AM ? ?Type of Therapy and Topic:  Group Therapy:  Self-Care after Hospitalization ? ?Participation Level:  Active  ? ?Description of Group ?This process group involved patients discussing how they plan to take care of themselves in a better manner when they get home from the hospital.  The group started with patients listing one healthy and one unhealthy way they took care of themselves prior to hospitalization.  A discussion ensued about the differences in healthy and unhealthy coping skills.  Group members shared ideas about making changes when they return home so that they can stay well and in recovery.  The white board was used to list ideas so that patients can continue to see these ideas throughout the day. ? ?Therapeutic Goals ?Patient will identify and describe one healthy and one unhealthy coping technique used prior to hospitalization ?Patient will participate in generating ideas about healthy self-care options when they return to the community ?Patients will be supportive of one another and receive said support from others ?Patient will identify one healthy self-care activity to add to his/her post-hospitalization life that can help in recovery ? ?Summary of Patient Progress:  The patient expressed that prior to hospitalization some healthy self-care activity that he engaged in was resting, while unhealthy self-care activity included sleeping too much.  Patient's participation in group was helpful.   Patient identified taking medication as another self-care activity to add in his pursuit of recovery post-discharge. ? ? ?Therapeutic Modalities ?Brief Solution-Focused Therapy ?Motivational Interviewing ?Psychoeducation ? ? ? ?Read Drivers, LCSWA ?06/12/2021  5:20 PM   ? ?

## 2021-06-13 ENCOUNTER — Encounter (HOSPITAL_COMMUNITY): Payer: Self-pay

## 2021-06-13 LAB — VITAMIN B1: Vitamin B1 (Thiamine): 121.6 nmol/L (ref 66.5–200.0)

## 2021-06-13 MED ORDER — VENLAFAXINE HCL ER 75 MG PO CP24
75.0000 mg | ORAL_CAPSULE | Freq: Every day | ORAL | Status: DC
  Administered 2021-06-14 – 2021-06-18 (×5): 75 mg via ORAL
  Filled 2021-06-13 (×6): qty 1

## 2021-06-13 NOTE — Group Note (Signed)
LCSW Group Therapy Note ? ? ?Group Date: 06/13/2021 ?Start Time: 1300 ?End Time: 1400 ? ?Type of Therapy and Topic: Group Therapy: Control ? ?Participation Level: Active ? ?Description of Group: ?In this group patients will discuss what is out of their control, what is somewhat in their control, and what is within their control.  They will be encouraged to explore what issues they can control and what issues are out of their control within their daily lives. They will be guided to discuss their thoughts, feelings, and behaviors related to these issues. The group will process together ways to better control things that are well within our own control and how to notice and accept the things that are not within our control. This group will be process-oriented, with patients participating in exploration of their own experiences as well as giving and receiving support and challenge from other group members.  During this group 2 worksheets will be provided to each patient to follow along and fill out.  ? ?Therapeutic Goals: ?1. Patient will identify what is within their control and what is not within their control. ?2. Patient will identify their thoughts and feelings about having control over their own lives. ?3. Patient will identify their thoughts and feelings about not having control over everything in their lives.Marland Kitchen ?4. Patient will identify ways that they can have more control over their own lives. ?5. Patient will identify areas were they can allow others to help them or provide assistance. ? ?Summary of Patient Progress:  The Pt attended the group and remained there the entire time.  The Pt accepted the worksheets that were provided and followed along throughout the group.  The Pt participated in the group discussion and was appropriate with their peers.  The Pt shared openly and demonstrated understanding of the topics being discussed.  ? ?Darleen Crocker, LCSWA ?06/13/2021  3:14 PM   ? ?

## 2021-06-13 NOTE — Progress Notes (Signed)
[facility] MD Progress Note  06/13/2021 5:51 PM Casey Silva  MRN:  [id number]  Subjective: Casey Silva states: "I still feel high anxiety and depression still. I don't have too much energy. I feel only a little improvement."  Today's assessment (06/13/2021): Pt's chart reviewed, case discussed with treatment team. Pt continues to present with a blunted affect and depressed mood, attention to personal hygiene and grooming is fair, eye contact is good, speech is clear & coherent. Thought contents are organized and logical, and pt currently denies SI/HI/AVH or paranoia. There is no evidence of delusional thoughts.  He reports a high level of anxiety today, he rates his depression 8 (10 being the worst), rates his depression 7-8 (10 being worst). Pt reports a good appetite, reports a fair sleep last night, states that he woke up at 0500. He denies any pain today, states that his back pain is much better. Positive reinforcements given for pt to come out to the day room and participate in activities and interact with staff and peers. Chart review for the past 24 hrs portray that he has been in groups and participating.  Today, he is visible in the halls at times pacing. Physical Therapist was on the unit today to see patient and he was in group activities. As per her documentation, will return tomorrow to see pt. Social work has filed APS report on pt, and awaiting outcome. SW to continue to follow up with this.  Na on admission was 130, with a slight improvement yesterday to 133. Labs reviewed. LDL-123, EKG from 2/28 with prolonged QTC at 504, repeated EKG-NSR and QTC of 463. Will increase Effexor to 75 mg daily effective tomorrow (3/7) for management of depressive symptoms. Pt agreeable to the increase in medications, and has been educated on the rationale, benefit and possible side effect of this medication, and verbalizes understanding. Sodium level on 3/2 was 130, and was 133 yesterday (3/5). Will recheck Na level in three  days(6/9) to make sure that it is continuing to trend up with the increase in Effexor.   Reason for Admission:As per HPI from pt's initial presentation to the [facility]: "Casey Silva is a 66 y.o. male with history of anxiety, depression, and bipolar 1 disorder who presents to the emergency department with complaints of anxiety, depression, and suicidal ideation.  Patient states he ran out of his psychiatric medicines 3 days ago which is led to a significant decline in his mental health.  He states he feels very anxious.  He states he just does not want to deal with everything anymore."  Principal Problem: MDD (major depressive disorder), recurrent, severe, with psychosis ([place]) Diagnosis: Principal Problem:   MDD (major depressive disorder), recurrent, severe, with psychosis ([place]) Active Problems:   MDD (major depressive disorder), recurrent episode, severe ([place])  Past Psychiatric History:  See H&P  Past Medical History:  Past Medical History:  Diagnosis Date   Anxiety    Asthma    Depression    History reviewed. No pertinent surgical history. Family History:  Family History  Problem Relation Age of Onset   Bipolar disorder Mother    Family Psychiatric  History: See H&P Social History:  Social History   Substance and Sexual Activity  Alcohol Use No     Social History   Substance and Sexual Activity  Drug Use No    Social History   Socioeconomic History   Marital status: Single    Spouse name: Not on file   Number of children: 1  Years of education: Not on file   Highest education level: GED or equivalent  Occupational History   Not on file  Tobacco Use   Smoking status: Never   Smokeless tobacco: Never  Vaping Use   Vaping Use: Unknown  Substance and Sexual Activity   Alcohol use: No   Drug use: No   Sexual activity: Not Currently  Other Topics Concern   Not on file  Social History Narrative   Marital status:  Single   Children: Daughter   Employment:   Unemployment. previous work for [employer].   Alcohol:  None   Drugs:  none   Exercise:  Regularly very   Religion:  Jewish   Social Determinants of Radio broadcast assistant Strain: Not on file  Food Insecurity: Not on file  Transportation Needs: Not on file  Physical Activity: Not on file  Stress: Not on file  Social Connections: Not on file   Additional Social History:    Sleep: Good  Appetite:  Good  Current Medications: Current Facility-Administered Medications  Medication Dose Route Frequency Provider Last Rate Last Admin   acetaminophen (TYLENOL) tablet 650 mg  650 mg Oral Q6H PRN Suella Broad, FNP   650 mg at 06/11/21 1430   alum & mag hydroxide-simeth (MAALOX/MYLANTA) 200-200-20 MG/5ML suspension 30 mL  30 mL Oral Q4H PRN Starkes-Perry, Gayland Curry, FNP       amLODipine (NORVASC) tablet 10 mg  10 mg Oral Daily Estrellita Lasky, Tamela Oddi, NP   10 mg at 06/13/21 2426   diclofenac Sodium (VOLTAREN) 1 % topical gel 2 g  2 g Topical QID Damita Dunnings B, MD   2 g at 06/12/21 1314   feeding supplement (ENSURE ENLIVE / ENSURE PLUS) liquid 237 mL  237 mL Oral BID BM Philip Eckersley, NP   237 mL at 06/13/21 1602   hydrOXYzine (ATARAX) tablet 25 mg  25 mg Oral Q6H PRN Nelda Marseille, Amy E, MD   25 mg at 06/13/21 1601   influenza vac split quadrivalent PF (FLUARIX) injection 0.5 mL  0.5 mL Intramuscular Tomorrow-1000 Rhia Blatchford, NP       magnesium hydroxide (MILK OF MAGNESIA) suspension 30 mL  30 mL Oral Daily PRN Suella Broad, FNP   30 mL at 06/09/21 1611   melatonin tablet 5 mg  5 mg Oral QHS Suella Broad, FNP   5 mg at 06/12/21 2136   mirtazapine (REMERON) tablet 30 mg  30 mg Oral QHS Suella Broad, FNP   30 mg at 06/12/21 2136   multivitamin with minerals tablet 1 tablet  1 tablet Oral Daily Harlow Asa, MD   1 tablet at 06/13/21 0833   OLANZapine (ZYPREXA) tablet 5 mg  5 mg Oral QHS Kerwin Augustus, NP   5 mg at 06/12/21 2136   propranolol  (INDERAL) tablet 10 mg  10 mg Oral BID Nelda Marseille, Amy E, MD   10 mg at 06/13/21 1736   traZODone (DESYREL) tablet 100 mg  100 mg Oral QHS [place] Welliver, NP   100 mg at 06/12/21 2136   [START ON 06/14/2021] venlafaxine XR (EFFEXOR-XR) 24 hr capsule 75 mg  75 mg Oral Q breakfast Nicholes Rough, NP       Vitamin D (Ergocalciferol) (DRISDOL) capsule 50,000 Units  50,000 Units Oral Q7 days Harlow Asa, MD   50,000 Units at 06/11/21 1926    Lab Results:  Results for orders placed or performed during the hospital encounter of  06/07/21 (from the past 48 hour(s))  Basic metabolic panel     Status: Abnormal   Collection Time: 06/12/21  6:40 AM  Result Value Ref Range   Sodium 133 (L) 135 - 145 mmol/L   Potassium 3.6 3.5 - 5.1 mmol/L   Chloride 99 98 - 111 mmol/L   CO2 28 22 - 32 mmol/L   Glucose, Bld 94 70 - 99 mg/dL    Comment: Glucose reference range applies only to samples taken after fasting for at least 8 hours.   BUN 14 8 - 23 mg/dL   Creatinine, Ser 1.03 0.61 - 1.24 mg/dL   Calcium 8.9 8.9 - 10.3 mg/dL   GFR, Estimated >60 >60 mL/min    Comment: (NOTE) Calculated using the CKD-EPI Creatinine Equation (2021)    Anion gap 6 5 - 15    Comment: Performed at [facility], [place] [address] [place], [place] [address]    Blood Alcohol level:  Lab Results  Component Value Date   ETH <10 06/07/2021   ETH <10 17/40/8144    Metabolic Disorder Labs: Lab Results  Component Value Date   HGBA1C 5.1 06/07/2021   MPG 99.67 06/07/2021   MPG 111.15 11/24/2020   No results found for: PROLACTIN Lab Results  Component Value Date   CHOL 197 06/07/2021   TRIG 149 06/07/2021   HDL 44 06/07/2021   CHOLHDL 4.5 06/07/2021   VLDL 30 06/07/2021   LDLCALC 123 (H) 06/07/2021   LDLCALC 148 (H) 11/24/2020   Physical Findings: AIMS: Facial and Oral Movements Muscles of Facial Expression: None, normal Lips and Perioral Area: None, normal Jaw: None, normal Tongue: None,  normal,Extremity Movements Upper (arms, wrists, hands, fingers): None, normal Lower (legs, knees, ankles, toes): None, normal, Trunk Movements Neck, shoulders, hips: None, normal, Overall Severity Severity of abnormal movements (highest score from questions above): None, normal Incapacitation due to abnormal movements: None, normal Patient's awareness of abnormal movements (rate only patient's report): No Awareness, Dental Status Current problems with teeth and/or dentures?: No Does patient usually wear dentures?: No   Musculoskeletal: Strength & Muscle Tone: within normal limits Gait & Station: shuffle Patient leans: Front  Psychiatric Specialty Exam:  Presentation  General Appearance: disheveled in scrubs, hair uncombed  Eye Contact:Good  Speech:slow, normal fluency  Speech Volume:Decreased  Handedness:Right  Mood and Affect  Mood: Described as anxious  Affect:anxious, restricted  Thought Process  Thought Processes:Coherent  Orientation:Full (Time, Place and Person)  Thought Content:Denies AVH, paranoia, ideas of reference, first rank symptoms, or HI; reports passive SI to [employer] but denies SI with attending  History of Schizophrenia/Schizoaffective disorder:No  Duration of Psychotic Symptoms:Greater than six months  Hallucinations:Hallucinations: None   Ideas of Reference:None  Suicidal Thoughts:Suicidal Thoughts: No   Homicidal Thoughts:Homicidal Thoughts: No   Sensorium  Memory:Immediate Good  Judgment:Fair  Insight:Fair  Executive Functions  Concentration:Fair  Attention Span:Fair  [place]   Psychomotor Activity  Psychomotor Activity:Psychomotor Activity: Psychomotor Retardation; Shuffling Gait   Assets  Assets:Communication Skills  Sleep  6 hours  MOCA: Visuospatial/ Executive: 4/5 (no cube) Naming: 3/3 Memory: good Attention: 5/6 ( 3 answers on Serial 7's) Language: 2/3(  only 7 "F" words) Abstraction 2/2 Delayed Recall: 5/5 Orientation: 6/6 11th grade education + 1 =28/30  Physical Exam HENT:     Head: Normocephalic and atraumatic.  Pulmonary:     Effort: Pulmonary effort is normal.  Neurological:     Mental Status: He is  alert and oriented to person, place, and time.   Review of Systems  Cardiovascular:  Negative for chest pain.  Musculoskeletal:  Negative for back pain.  Neurological:  Negative for headaches.  Psychiatric/Behavioral:  Positive for depression. Negative for hallucinations and suicidal ideas. The patient is nervous/anxious. The patient does not have insomnia.        Passive SI  Blood pressure 121/81, pulse 83, temperature 97.9 F (36.6 C), resp. rate 14, height 5' 6.54" (1.69 m), weight 71.8 kg, SpO2 98 %. Body mass index is 25.13 kg/m.   MDD recurrent severe with psychotic features GAD by hx   PLAN: Safety and Monitoring:             -- Voluntary admission to inpatient psychiatric unit for safety, stabilization and treatment             -- Daily contact with patient to assess and evaluate symptoms and progress in treatment             -- Patient's case to be discussed in multi-disciplinary team meeting             -- Observation Level : q15 minute checks             -- Vital signs:  q12 hours             -- Precautions: suicide, elopement, and assault   2. Psychiatric Diagnoses and Treatment:              MDD recurrent severe with psychotic features GAD by hx -Continue Zyprexa 5 mg nightly for psychosis (monitoring of QTC) - Increase Effexor XR to 75 mg starting tomorrow (3/7) and monitor Na+ closely, and repeat on 3/9 to make it is continuing to trend up. -Continue Remeron 30 mg nightly  -Continue Hydroxyzine 25 mg every 6 hours PRN anxiety (monitoring QTC) - Continue Inderal '10mg'$  BID, for anxiety - Prompting to attended to ADLs and to eat  - MOCA 28/30 - Continue room lock out for meals and groups             --  Metabolic profile and EKG monitoring obtained while on an atypical antipsychotic (Lipid Panel: WNL except for LDL 123; HbgA1c:5.1; QTc:43m, B12- WNL, Vit D- 14.94, Folate- WNL) Repeat EKG- 463 (3/5)             -- Encouraged patient to participate in unit milieu and in scheduled group therapies                Insomnia -Continue Trazodone 100 mg nightly (monitoring QTC) -Continue Melatonin 5 mg nightly  - Improved sleep hygiene encouraged                          3. Medical Issues Being Addressed:              Hyponatremia - Trending BMP for monitoring, Na+ 133 (3/5) - Hope will correct as po intake improves, thus far improving   Prolonged QTC - resolved - EKG shows QTC 4674m 463 on repeat               HTN             -- Continue Norvasc '10mg'$  daily               Low Vit D             -- Continue Vit D 50,000 weekly  Self-care deficits - PT and OT consults obtained for ambulation and assessment for ability to function independently after discharge (PT helping with posture and gait and OT feels he has physical and cognitive abilities to function independently but has psychological challenges limiting his perfomance in IADLs. PT eval to be completed tomorrow (3/7). - Ensure tid and po intake encouraged - MVI ordered daily - SW placed APS referral, 3/5   Back Pain - Volatren gel PRN for back pain and encouraged to be OOB   4. Discharge Planning:              -- Social work and case management to assist with discharge planning and identification of hospital follow-up needs prior to discharge             -- Estimated LOS: 7-10 days             -- Discharge Concerns: Need to establish a safety plan; Medication compliance and effectiveness             -- Discharge Goals: Return home with outpatient referrals for mental health follow-up including medication management/psychotherapy   Nicholes Rough, NP 06/13/2021, 5:51 PM Patient ID: Casey Silva, male   DOB: [birth year], 66 y.o.   MRN:  [id number]

## 2021-06-13 NOTE — [facility] Group Notes (Signed)
PsychoEducational Group Note- ?The patients were educated on positive reframing techniques, and ways that negative thinking can impact mental health. A poem was read by the [place] in regards to thought patterns. The patients were then asked to participate in relaxation and mindfulness techniques and ended group with deep breathing practice. The patient attended and was appropriate.  ?

## 2021-06-13 NOTE — [facility] Group Notes (Signed)
Adult Psychoeducational Group Note ? ?Date:  06/13/2021 ?Time:  9:26 AM ? ?Group Topic/Focus:  ?Goals Group:   The focus of this group is to help patients establish daily goals to achieve during treatment and discuss how the patient can incorporate goal setting into their daily lives to aide in recovery. ? ?Participation Level:  Did Not Attend ? ? ? ?Dub Mikes ?06/13/2021, 9:26 AM ?

## 2021-06-13 NOTE — Progress Notes (Signed)
?   06/13/21 0020  ?Psych Admission Type (Psych Patients Only)  ?Admission Status Voluntary  ?Psychosocial Assessment  ?Patient Complaints Anxiety  ?Eye Contact Brief  ?Facial Expression Flat  ?Affect Anxious  ?Speech Logical/coherent  ?Interaction Minimal;Guarded  ?Motor Activity Slow  ?Appearance/Hygiene In scrubs  ?Behavior Characteristics Cooperative  ?Mood Pleasant  ?Thought Process  ?Coherency WDL  ?Content WDL  ?Delusions None reported or observed  ?Perception WDL  ?Hallucination None reported or observed  ?Judgment Impaired  ?Confusion None  ?Danger to Self  ?Current suicidal ideation? Denies  ?Self-Injurious Behavior No self-injurious ideation or behavior indicators observed or expressed   ?Agreement Not to Harm Self Yes  ?Description of Agreement verbal contract  ?Danger to Others  ?Danger to Others None reported or observed  ? ?D: Patient in room on approach. Pt stated today was better because his back is not hurting as bad.   ?A: Medications administered as prescribed. Support and encouragement provided as needed.  ?R: Patient remains safe on the unit. Will continue to monitor for safety and stability.   ?

## 2021-06-13 NOTE — Progress Notes (Signed)
PT Cancellation Note ? ?Patient Details ?Name: Casey Silva ?MRN: [id number] ?DOB: Aug 19, 1955 ? ? ?Cancelled Treatment:     Came over later this morning to see pt. He was in group and according to the chart this is an important step for him and part of his treatment for anxiety so I did not pull him put of group for PT. According to report sounds like he is mobilizing better, sleeping and eating better may be helping as well. Will return to try to see pt this afternoon is able, or tomorrow.  ? ? Clide Dales ?06/13/2021, 12:10 PM ?Gatha Mayer, PT, MPT ?Acute Rehabilitation Services ?Office: [phone] ?Pager: [phone] ?06/13/2021 ? ?

## 2021-06-13 NOTE — Group Note (Signed)
Recreation Therapy Group Note ? ? ?Group Topic:Stress Management  ?Group Date: 06/13/2021 ?Start Time: 16 ?End Time: 8257 ?Facilitators: Victorino Sparrow, LRT,CTRS ?Location: [place] ? ? ?Goal Area(s) Addresses:  ?Patient will identify positive stress management techniques. ?Patient will identify benefits of using stress management post d/c. ? ?Group Description:  Meditation.  LRT played a meditation that focused on using your day to restore, forgive and show grace to yourself and others.  The meditation also focused on loving every part of yourself flaws and all.  Patients were to listen and follow along with the meditation as it played to fully engage in activity. ? ? ?Affect/Mood: N/A ?  ?Participation Level: Did not attend ?  ? ?Clinical Observations/Individualized Feedback:   ? ? ?Plan: Continue to engage patient in RT group sessions 2-3x/week. ? ? ?Victorino Sparrow, LRT,CTRS ?06/13/2021 1:39 PM ?

## 2021-06-13 NOTE — Progress Notes (Signed)
?   06/13/21 2105  ?Psych Admission Type (Psych Patients Only)  ?Admission Status Voluntary  ?Psychosocial Assessment  ?Patient Complaints Anxiety;Worrying  ?Eye Contact Fair  ?Facial Expression Flat;Worried  ?Affect Appropriate to circumstance  ?Speech Logical/coherent  ?Interaction Minimal;Guarded  ?Motor Activity Slow  ?Appearance/Hygiene Body odor;Disheveled;In scrubs  ?Behavior Characteristics Cooperative;Appropriate to situation  ?Mood Anxious;Pleasant  ?Thought Process  ?Coherency WDL  ?Content WDL  ?Delusions None reported or observed  ?Perception WDL  ?Hallucination None reported or observed  ?Judgment Impaired  ?Confusion None  ?Danger to Self  ?Current suicidal ideation? Denies  ?Self-Injurious Behavior No self-injurious ideation or behavior indicators observed or expressed   ?Agreement Not to Harm Self Yes  ?Description of Agreement Verbal Contract  ?Danger to Others  ?Danger to Others None reported or observed  ? ? ?

## 2021-06-13 NOTE — BH IP Treatment Plan (Signed)
Interdisciplinary Treatment and Diagnostic Plan Update  06/13/2021 Time of Session: 9:15am Casey Silva MRN: [id number]  Principal Diagnosis: MDD (major depressive disorder), recurrent, severe, with psychosis ([place])  Secondary Diagnoses: Principal Problem:   MDD (major depressive disorder), recurrent, severe, with psychosis ([place]) Active Problems:   MDD (major depressive disorder), recurrent episode, severe ([place])   Current Medications:  Current Facility-Administered Medications  Medication Dose Route Frequency Provider Last Rate Last Admin   acetaminophen (TYLENOL) tablet 650 mg  650 mg Oral Q6H PRN Suella Broad, FNP   650 mg at 06/11/21 1430   alum & mag hydroxide-simeth (MAALOX/MYLANTA) 200-200-20 MG/5ML suspension 30 mL  30 mL Oral Q4H PRN Starkes-Perry, Gayland Curry, FNP       amLODipine (NORVASC) tablet 10 mg  10 mg Oral Daily Nkwenti, Tamela Oddi, NP   10 mg at 06/13/21 1884   diclofenac Sodium (VOLTAREN) 1 % topical gel 2 g  2 g Topical QID Damita Dunnings B, MD   2 g at 06/12/21 1314   feeding supplement (ENSURE ENLIVE / ENSURE PLUS) liquid 237 mL  237 mL Oral BID BM Nkwenti, Doris, NP   237 mL at 06/12/21 1559   hydrOXYzine (ATARAX) tablet 25 mg  25 mg Oral Q6H PRN Viann Fish E, MD   25 mg at 06/13/21 0834   influenza vac split quadrivalent PF (FLUARIX) injection 0.5 mL  0.5 mL Intramuscular Tomorrow-1000 Nkwenti, Doris, NP       magnesium hydroxide (MILK OF MAGNESIA) suspension 30 mL  30 mL Oral Daily PRN Suella Broad, FNP   30 mL at 06/09/21 1611   melatonin tablet 5 mg  5 mg Oral QHS Suella Broad, FNP   5 mg at 06/12/21 2136   mirtazapine (REMERON) tablet 30 mg  30 mg Oral QHS Suella Broad, FNP   30 mg at 06/12/21 2136   multivitamin with minerals tablet 1 tablet  1 tablet Oral Daily Harlow Asa, MD   1 tablet at 06/13/21 0833   OLANZapine (ZYPREXA) tablet 5 mg  5 mg Oral QHS Nkwenti, Doris, NP   5 mg at 06/12/21 2136   propranolol (INDERAL)  tablet 10 mg  10 mg Oral BID Harlow Asa, MD   10 mg at 06/13/21 1660   traZODone (DESYREL) tablet 100 mg  100 mg Oral QHS Nicholes Rough, NP   100 mg at 06/12/21 2136   [START ON 06/14/2021] venlafaxine XR (EFFEXOR-XR) 24 hr capsule 75 mg  75 mg Oral Q breakfast Nkwenti, Tamela Oddi, NP       Vitamin D (Ergocalciferol) (DRISDOL) capsule 50,000 Units  50,000 Units Oral Q7 days Harlow Asa, MD   50,000 Units at 06/11/21 1926   PTA Medications: Medications Prior to Admission  Medication Sig Dispense Refill Last Dose   acetaminophen (TYLENOL) 500 MG tablet Take 1,000 mg by mouth every 6 (six) hours as needed for mild pain or headache.      amLODipine (NORVASC) 5 MG tablet Take 1 tablet (5 mg total) by mouth daily. For high blood pressure (Patient not taking: Reported on 06/07/2021) 30 tablet 0    docusate sodium (COLACE) 100 MG capsule Take 1 capsule (100 mg total) by mouth daily. (May buy from over the counter): For constipation (Patient taking differently: Take 100 mg by mouth daily as needed for mild constipation. (May buy from over the counter): For constipation) 1 capsule 0    hydrOXYzine (ATARAX) 50 MG tablet Take 1 tablet (50 mg total) by  mouth every 6 (six) hours as needed for anxiety. (Patient taking differently: Take 50-150 mg by mouth every 6 (six) hours as needed for anxiety.) 90 tablet 3    melatonin 5 MG TABS Take 1 tablet (5 mg total) by mouth at bedtime. For depression (Patient taking differently: Take 10 mg by mouth at bedtime. For depression) 30 tablet 3    mirtazapine (REMERON) 30 MG tablet Take 1 tablet (30 mg total) by mouth at bedtime. 30 tablet 3    polyethylene glycol (MIRALAX / GLYCOLAX) 17 g packet Take 17 g by mouth daily. (May buy from over the counter): For constipation (Patient taking differently: Take 17 g by mouth daily as needed for mild constipation. (May buy from over the counter): For constipation) 1 each 0    risperiDONE (RISPERDAL) 3 MG tablet Take 1 tablet (3 mg  total) by mouth at bedtime. For mood control 30 tablet 3    traZODone (DESYREL) 100 MG tablet Take 2 tablets (200 mg total) by mouth at bedtime. For sleep 60 tablet 3     Patient Stressors: Health problems   Medication change or noncompliance   Traumatic event    Patient Strengths: Ability for insight  Communication skills   Treatment Modalities: Medication Management, Group therapy, Case management,  1 to 1 session with clinician, Psychoeducation, Recreational therapy.   Physician Treatment Plan for Primary Diagnosis: MDD (major depressive disorder), recurrent, severe, with psychosis ([place]) Long Term Goal(s): Improvement in symptoms so as ready for discharge   Short Term Goals: Ability to identify changes in lifestyle to reduce recurrence of condition will improve Ability to disclose and discuss suicidal ideas Ability to demonstrate self-control will improve Ability to identify and develop effective coping behaviors will improve Ability to maintain clinical measurements within normal limits will improve Compliance with prescribed medications will improve Ability to identify triggers associated with substance abuse/mental health issues will improve  Medication Management: Evaluate patient's response, side effects, and tolerance of medication regimen.  Therapeutic Interventions: 1 to 1 sessions, Unit Group sessions and Medication administration.  Evaluation of Outcomes: Not Met  Physician Treatment Plan for Secondary Diagnosis: Principal Problem:   MDD (major depressive disorder), recurrent, severe, with psychosis ([place]) Active Problems:   MDD (major depressive disorder), recurrent episode, severe ([place])  Long Term Goal(s): Improvement in symptoms so as ready for discharge   Short Term Goals: Ability to identify changes in lifestyle to reduce recurrence of condition will improve Ability to disclose and discuss suicidal ideas Ability to demonstrate self-control will  improve Ability to identify and develop effective coping behaviors will improve Ability to maintain clinical measurements within normal limits will improve Compliance with prescribed medications will improve Ability to identify triggers associated with substance abuse/mental health issues will improve     Medication Management: Evaluate patient's response, side effects, and tolerance of medication regimen.  Therapeutic Interventions: 1 to 1 sessions, Unit Group sessions and Medication administration.  Evaluation of Outcomes: Not Met   RN Treatment Plan for Primary Diagnosis: MDD (major depressive disorder), recurrent, severe, with psychosis ([place]) Long Term Goal(s): Knowledge of disease and therapeutic regimen to maintain health will improve  Short Term Goals: Ability to remain free from injury will improve, Ability to verbalize frustration and anger appropriately will improve, Ability to identify and develop effective coping behaviors will improve, and Compliance with prescribed medications will improve  Medication Management: RN will administer medications as ordered by provider, will assess and evaluate patient's response and provide education to patient for  prescribed medication. RN will report any adverse and/or side effects to prescribing provider.  Therapeutic Interventions: 1 on 1 counseling sessions, Psychoeducation, Medication administration, Evaluate responses to treatment, Monitor vital signs and CBGs as ordered, Perform/monitor CIWA, COWS, AIMS and Fall Risk screenings as ordered, Perform wound care treatments as ordered.  Evaluation of Outcomes: Not Met   LCSW Treatment Plan for Primary Diagnosis: MDD (major depressive disorder), recurrent, severe, with psychosis ([place]) Long Term Goal(s): Safe transition to appropriate next level of care at discharge, Engage patient in therapeutic group addressing interpersonal concerns.  Short Term Goals: Engage patient in aftercare planning  with referrals and resources, Increase social support, Increase ability to appropriately verbalize feelings, Increase emotional regulation, Identify triggers associated with mental health/substance abuse issues, and Increase skills for wellness and recovery  Therapeutic Interventions: Assess for all discharge needs, 1 to 1 time with Social worker, Explore available resources and support systems, Assess for adequacy in community support network, Educate family and significant other(s) on suicide prevention, Complete Psychosocial Assessment, Interpersonal group therapy.  Evaluation of Outcomes: Not Met   PProgress in Treatment: Attending groups: Yes. Participating in groups: Yes. Taking medication as prescribed: Yes. Toleration medication: Yes. Family/Significant other contact made: No, individual(s) contacted:  Patient declined consents Patient understands diagnosis: Yes. Discussing patient identified problems/goals with staff: Yes. Medical problems stabilized or resolved: Yes. Denies suicidal/homicidal ideation: Yes. Issues/concerns per patient self-inventory: No.     New problem(s) identified: No, Describe:  None    New Short Term/Long Term Goal(s): medication stabilization, elimination of SI thoughts, development of comprehensive mental wellness plan.    Patient Goals: "To get on the right medications"    Discharge Plan or Barriers: Patient is to follow up with [facility] for therapy and medication management   Reason for Continuation of Hospitalization: Depression Hallucinations Medication stabilization Suicidal ideation   Estimated Length of Stay: 3 to 5 days    Scribe for Treatment Team: Vassie Moselle, LCSW 06/13/2021 11:30 AM

## 2021-06-13 NOTE — BHH Counselor (Signed)
CSW attempted to contact [facility] APS regarding if report was screened in or out. Social Worker could not let this Education officer, museum know since report was not made by this Education officer, museum.  APS worker agreed to take down Education officer, museum number and if case was taken a Education officer, museum would call.  ? ? ?Roisin Mones, LCSW, LCAS ?Clincal Social Worker  ?[facility] ? ?

## 2021-06-14 MED ORDER — OLANZAPINE 2.5 MG PO TABS
2.5000 mg | ORAL_TABLET | Freq: Every day | ORAL | Status: DC
  Administered 2021-06-14 – 2021-06-22 (×9): 2.5 mg via ORAL
  Filled 2021-06-14 (×10): qty 1

## 2021-06-14 MED ORDER — GABAPENTIN 100 MG PO CAPS
100.0000 mg | ORAL_CAPSULE | Freq: Three times a day (TID) | ORAL | Status: DC
  Administered 2021-06-14 – 2021-06-17 (×11): 100 mg via ORAL
  Filled 2021-06-14 (×15): qty 1

## 2021-06-14 NOTE — Progress Notes (Signed)
[facility] MD Progress Note  06/14/2021 3:47 PM Casey Silva  MRN:  [id number]  Subjective: Casey Silva states: "I am still very anxious. The only thing that seems to help me is to lie down."  Today's assessment (06/14/2021): Pt's chart reviewed, case discussed with treatment team. Pt continues to present with a blunted affect and depressed mood, attention to personal hygiene and grooming is fair, eye contact is good, speech is clear & coherent. Pt is ambulating with a shuffling gait, but no tremors visible. Thought contents are organized and logical, and pt currently denies SI/HI/AVH or paranoia. There is no evidence of delusional thoughts.  He continues to report having a high level of anxiety.  He rates his depression 8 (10 being the worst), rates his anxiety 9 (10 being worst). Pt reports a good appetite, reports a fair sleep last night, states he slept a little better as compared to the night prior. He denies being in any pain/physical distress today. He is observed to be restless, pacing up and down in his room, and states that he is unable to sit still. He reports being worried about "everything in general", and states that he is thinking of how he will return back home and be lonely again, and he is worried that nothing so far is helping decrease his anxiety. Pt is agreeable to adding a low dose Zyprexa 2.5 mg to his medication regimen to be taken in the mornings for management of his depressive symptoms and to help relieve his anxiety as well. He is also agreeable to the addition of Gabapentin 100 mg TID to help with anxiety/pain. Rationales, benefits and possible side effects of medications explained to pt who is agreeable to the above changes.   Chart review for the past 24 hrs portray that he has been in groups and participating and is compliant with his scheduled medications.  Social work has filed APS report on pt, and awaiting outcome. SW to continue to follow up with this.  Na on admission was 130, with a  slight improvement yesterday to 133. Labs reviewed. LDL-123, EKG from 2/28 with prolonged QTC at 504, repeated EKG-NSR and QTC of 463. Effexor was increased  to 75 mg daily effective today (3/7) for management of depressive symptoms. Sodium level on 3/2 was 130, and was 133 yesterday (3/5). Will recheck Na level in three days(6/9) to make sure that it is continuing to trend up with the increase in Effexor.   Reason for Admission:As per HPI from pt's initial presentation to the [facility]: "Casey Silva is a 66 y.o. male with history of anxiety, depression, and bipolar 1 disorder who presents to the emergency department with complaints of anxiety, depression, and suicidal ideation.  Patient states he ran out of his psychiatric medicines 3 days ago which is led to a significant decline in his mental health.  He states he feels very anxious.  He states he just does not want to deal with everything anymore."  Principal Problem: MDD (major depressive disorder), recurrent, severe, with psychosis ([place]) Diagnosis: Principal Problem:   MDD (major depressive disorder), recurrent, severe, with psychosis ([place]) Active Problems:   GAD (generalized anxiety disorder)   Tobacco user  Past Psychiatric History:  See H&P  Past Medical History:  Past Medical History:  Diagnosis Date   Anxiety    Asthma    Depression    History reviewed. No pertinent surgical history. Family History:  Family History  Problem Relation Age of Onset   Bipolar disorder Mother  Family Psychiatric  History: See H&P Social History:  Social History   Substance and Sexual Activity  Alcohol Use No     Social History   Substance and Sexual Activity  Drug Use No    Social History   Socioeconomic History   Marital status: Single    Spouse name: Not on file   Number of children: 1   Years of education: Not on file   Highest education level: GED or equivalent  Occupational History   Not on file  Tobacco Use   Smoking status:  Never   Smokeless tobacco: Never  Vaping Use   Vaping Use: Unknown  Substance and Sexual Activity   Alcohol use: No   Drug use: No   Sexual activity: Not Currently  Other Topics Concern   Not on file  Social History Narrative   Marital status:  Single   Children: Daughter   Employment:  Unemployment. previous work for [employer].   Alcohol:  None   Drugs:  none   Exercise:  Regularly very   Religion:  Jewish   Social Determinants of Radio broadcast assistant Strain: Not on file  Food Insecurity: Not on file  Transportation Needs: Not on file  Physical Activity: Not on file  Stress: Not on file  Social Connections: Not on file   Additional Social History:    Sleep: Good  Appetite:  Good  Current Medications: Current Facility-Administered Medications  Medication Dose Route Frequency Provider Last Rate Last Admin   acetaminophen (TYLENOL) tablet 650 mg  650 mg Oral Q6H PRN Suella Broad, FNP   650 mg at 06/11/21 1430   alum & mag hydroxide-simeth (MAALOX/MYLANTA) 200-200-20 MG/5ML suspension 30 mL  30 mL Oral Q4H PRN Starkes-Perry, Gayland Curry, FNP       amLODipine (NORVASC) tablet 10 mg  10 mg Oral Daily Nicholes Rough, NP   10 mg at 06/14/21 0813   diclofenac Sodium (VOLTAREN) 1 % topical gel 2 g  2 g Topical QID Damita Dunnings B, MD   2 g at 06/12/21 1314   feeding supplement (ENSURE ENLIVE / ENSURE PLUS) liquid 237 mL  237 mL Oral BID BM Alma Muegge, NP   237 mL at 06/14/21 0958   gabapentin (NEURONTIN) capsule 100 mg  100 mg Oral TID Nicholes Rough, NP   100 mg at 06/14/21 1317   influenza vac split quadrivalent PF (FLUARIX) injection 0.5 mL  0.5 mL Intramuscular Tomorrow-1000 Gavriela Cashin, NP       magnesium hydroxide (MILK OF MAGNESIA) suspension 30 mL  30 mL Oral Daily PRN Suella Broad, FNP   30 mL at 06/09/21 1611   melatonin tablet 5 mg  5 mg Oral QHS Suella Broad, FNP   5 mg at 06/13/21 2105   mirtazapine (REMERON) tablet 30 mg  30  mg Oral QHS Suella Broad, FNP   30 mg at 06/13/21 2105   multivitamin with minerals tablet 1 tablet  1 tablet Oral Daily Harlow Asa, MD   1 tablet at 06/14/21 0812   OLANZapine (ZYPREXA) tablet 2.5 mg  2.5 mg Oral Daily Elcie Pelster, Tamela Oddi, NP   2.5 mg at 06/14/21 1316   OLANZapine (ZYPREXA) tablet 5 mg  5 mg Oral QHS Deaundre Allston, NP   5 mg at 06/13/21 2105   propranolol (INDERAL) tablet 10 mg  10 mg Oral BID Harlow Asa, MD   10 mg at 06/14/21 0813   traZODone ([place])  tablet 100 mg  100 mg Oral QHS Hermina Barnard, NP   100 mg at 06/13/21 2105   venlafaxine XR (EFFEXOR-XR) 24 hr capsule 75 mg  75 mg Oral Q breakfast Nicholes Rough, NP   75 mg at 06/14/21 0813   Vitamin D (Ergocalciferol) (DRISDOL) capsule 50,000 Units  50,000 Units Oral Q7 days Harlow Asa, MD   50,000 Units at 06/11/21 1926   Lab Results:  No results found for this or any previous visit (from the past 13 hour(s)).  Blood Alcohol level:  Lab Results  Component Value Date   ETH <10 06/07/2021   ETH <10 93/57/0177   Metabolic Disorder Labs: Lab Results  Component Value Date   HGBA1C 5.1 06/07/2021   MPG 99.67 06/07/2021   MPG 111.15 11/24/2020   No results found for: PROLACTIN Lab Results  Component Value Date   CHOL 197 06/07/2021   TRIG 149 06/07/2021   HDL 44 06/07/2021   CHOLHDL 4.5 06/07/2021   VLDL 30 06/07/2021   LDLCALC 123 (H) 06/07/2021   LDLCALC 148 (H) 11/24/2020   Physical Findings: AIMS: Facial and Oral Movements Muscles of Facial Expression: None, normal Lips and Perioral Area: None, normal Jaw: None, normal Tongue: None, normal,Extremity Movements Upper (arms, wrists, hands, fingers): None, normal Lower (legs, knees, ankles, toes): None, normal, Trunk Movements Neck, shoulders, hips: None, normal, Overall Severity Severity of abnormal movements (highest score from questions above): None, normal Incapacitation due to abnormal movements: None, normal Patient's  awareness of abnormal movements (rate only patient's report): No Awareness, Dental Status Current problems with teeth and/or dentures?: No Does patient usually wear dentures?: No   Musculoskeletal: Strength & Muscle Tone: within normal limits Gait & Station: shuffle Patient leans: Front  Psychiatric Specialty Exam:  Presentation  General Appearance: disheveled in scrubs, hair uncombed  Eye Contact:Fair  Speech:slow, normal fluency  Speech Volume:Normal  Handedness:Right  Mood and Affect  Mood: Described as anxious  Affect:anxious, restricted  Thought Process  Thought Processes:Disorganized  Orientation:Full (Time, Place and Person)  Thought Content:Denies AVH, paranoia, ideas of reference, first rank symptoms, or HI;  History of Schizophrenia/Schizoaffective disorder:No  Duration of Psychotic Symptoms:Greater than six months  Hallucinations:Hallucinations: None   Ideas of Reference:None  Suicidal Thoughts:Suicidal Thoughts: No   Homicidal Thoughts:Homicidal Thoughts: No  Sensorium  Memory:Immediate Good  Judgment:Fair  Insight:Fair  Executive Functions  Concentration:Fair  Attention Span:Fair  [place]  Psychomotor Activity  Psychomotor Activity:Psychomotor Activity: Normal  Assets  Assets:Communication Skills  Sleep  6 hours  MOCA: Visuospatial/ Executive: 4/5 (no cube) Naming: 3/3 Memory: good Attention: 5/6 ( 3 answers on Serial 7's) Language: 2/3( only 7 "F" words) Abstraction 2/2 Delayed Recall: 5/5 Orientation: 6/6 11th grade education + 1 =28/30  Physical Exam HENT:     Head: Normocephalic and atraumatic.  Pulmonary:     Effort: Pulmonary effort is normal.  Neurological:     Mental Status: He is alert and oriented to person, place, and time.   Review of Systems  Cardiovascular:  Negative for chest pain.  Musculoskeletal:  Negative for back pain.  Neurological:  Negative  for headaches.  Psychiatric/Behavioral:  Positive for depression. Negative for hallucinations and suicidal ideas. The patient is nervous/anxious. The patient does not have insomnia.        Passive SI  Blood pressure (!) 125/94, pulse 81, temperature 97.9 F (36.6 C), temperature source Oral, resp. rate 14, height 5' 6.54" (1.69 m), weight 71.8 kg, SpO2  98 %. Body mass index is 25.13 kg/m.   MDD recurrent severe with psychotic features GAD by hx   PLAN: Safety and Monitoring:             -- Voluntary admission to inpatient psychiatric unit for safety, stabilization and treatment             -- Daily contact with patient to assess and evaluate symptoms and progress in treatment             -- Patient's case to be discussed in multi-disciplinary team meeting             -- Observation Level : q15 minute checks             -- Vital signs:  q12 hours             -- Precautions: suicide, elopement, and assault   2. Psychiatric Diagnoses and Treatment:              MDD recurrent severe with psychotic features GAD by hx -Start Zyprexa 2.5 mg in the mornings -Continue Zyprexa 5 mg nightly for psychosis (monitoring of QTC) - Continue Effexor XR to 75 mg and monitor Na+ closely, and repeat on 3/9 to make sure it is continuing to trend up. -Continue Remeron 30 mg nightly  -Discontinued Hydroxyzine 25 mg PRN as it might be contributing to restlessness in patient.  - Continue Inderal '10mg'$  BID, for anxiety - Prompting to attend to ADLs and to eat  - MOCA 28/30 - Continue room lock out for meals and groups             -- Metabolic profile and EKG monitoring obtained while on an atypical antipsychotic (Lipid Panel: WNL except for LDL 123; HbgA1c:5.1; QTc:464m, B12- WNL, Vit D- 14.94, Folate- WNL) Repeat EKG- 463 (3/5)             -- Encouraged patient to participate in unit milieu and in scheduled group therapies                Insomnia -Continue Trazodone 100 mg nightly (monitoring  QTC) -Continue Melatonin 5 mg nightly  - Improved sleep hygiene encouraged                          3. Medical Issues Being Addressed:              Hyponatremia - Trending BMP for monitoring, Na+ 133 (3/5) - Hope will correct as po intake improves, thus far improving   Prolonged QTC - resolved - EKG shows QTC 4622m 463 on repeat               HTN             -- Continue Norvasc '10mg'$  daily               Low Vit D             -- Continue Vit D 50,000 weekly   Self-care deficits - PT and OT consults obtained for ambulation and assessment for ability to function independently after discharge (PT helping with posture and gait and OT feels he has physical and cognitive abilities to function independently but has psychological challenges limiting his perfomance in IADLs. PT eval to be completed tomorrow (3/7). - Ensure tid and po intake encouraged - MVI ordered daily - SW placed APS referral, 3/5   Back Pain - Volatren gel PRN  for back pain and encouraged to be OOB   4. Discharge Planning:              -- Social work and case management to assist with discharge planning and identification of hospital follow-up needs prior to discharge             -- Estimated LOS: 7-10 days             -- Discharge Concerns: Need to establish a safety plan; Medication compliance and effectiveness             -- Discharge Goals: Return home with outpatient referrals for mental health follow-up including medication management/psychotherapy  Nicholes Rough, NP 06/14/2021, 3:47 PM Patient ID: Casey Silva, male   DOB: 14-Aug-1955, 66 y.o.   MRN: [id number]

## 2021-06-14 NOTE — Progress Notes (Signed)
?   06/14/21 1800  ?Psych Admission Type (Psych Patients Only)  ?Admission Status Voluntary  ?Psychosocial Assessment  ?Patient Complaints Anxiety;Worrying  ?Eye Contact Fair  ?Facial Expression Flat;Worried  ?Affect Depressed  ?Speech Logical/coherent  ?Interaction Guarded  ?Motor Activity Slow  ?Appearance/Hygiene In scrubs  ?Behavior Characteristics Appropriate to situation  ?Mood Pleasant  ?Thought Process  ?Coherency WDL  ?Content WDL  ?Delusions None reported or observed  ?Perception WDL  ?Hallucination None reported or observed  ?Judgment Impaired  ?Confusion None  ?Danger to Self  ?Current suicidal ideation? Passive  ?Self-Injurious Behavior No self-injurious ideation or behavior indicators observed or expressed   ?Agreement Not to Harm Self No  ?Description of Agreement Verbally contracts for safety on the unit  ?Danger to Others  ?Danger to Others None reported or observed  ? ? ?

## 2021-06-14 NOTE — BHH Group Notes (Signed)
Spiritual care group on grief and loss facilitated by chaplain Janne Napoleon, [facility]  ? ?Group Goal:  ? ?Support / Education around grief and loss  ? ?Members engage in facilitated group support and psycho-social education.  ? ?Group Description:  ? ?Following introductions and group rules, group members engaged in facilitated group dialog and support around topic of loss, with particular support around experiences of loss in their lives. Group Identified types of loss (relationships / self / things) and identified patterns, circumstances, and changes that precipitate losses. Reflected on thoughts / feelings around loss, normalized grief responses, and recognized variety in grief experience. Group noted Worden's four tasks of grief in discussion.  ? ?Group drew on Adlerian / Rogerian, narrative, MI,  ? ?Patient Progress: Casey Silva attended group.  He appeared somewhat anxious and stated that he sometimes needed to stand up.  He was not disruptive, but did alternate between standing and sitting throughout the session.  He was did participate in the conversation at times and his comments were on topic. ? ?[employer], Bcc ?Pager: [phone] ?10:57 AM ? ?

## 2021-06-14 NOTE — [facility] Group Notes (Signed)
[facility] Group Notes:  (Nursing/MHT/Case Management/Adjunct) ? ?Date:  06/14/2021  ?Time:  8:24 PM ? ?Type of Therapy:  Group Therapy ? ?Participation Level:  Active ? ?Participation Quality:  Appropriate ? ?Affect:  Appropriate ? ?Cognitive:  Appropriate ? ?Insight:  Good ? ?Engagement in Group:  Engaged ? ?Modes of Intervention:  Discussion ? ?Summary of Progress/Problems: Pt stated he had a good day, medication was changed and hes doing well ? ?Chase Picket ?06/14/2021, 8:24 PM ?

## 2021-06-14 NOTE — Progress Notes (Signed)
Patient attend AA wrap up group. 

## 2021-06-14 NOTE — Group Note (Signed)
Date:  06/14/2021 ?Time:  11:38 AM ? ?Group Topic/Focus:  ?Goals Group:   The focus of this group is to help patients establish daily goals to achieve during treatment and discuss how the patient can incorporate goal setting into their daily lives to aide in recovery. ?Orientation:   The focus of this group is to educate the patient on the purpose and policies of crisis stabilization and provide a format to answer questions about their admission.  The group details unit policies and expectations of patients while admitted. ? ? ? ?Participation Level:  Active ? ?Participation Quality:  Appropriate ? ?Affect:  Appropriate ? ?Cognitive:  Appropriate ? ?Insight: Appropriate ? ?Engagement in Group:  Engaged ? ?Modes of Intervention:  Discussion ? ?Additional Comments:   ? ?Garvin Fila ?06/14/2021, 11:38 AM ? ?

## 2021-06-14 NOTE — Progress Notes (Signed)
Physical Therapy Treatment ?Patient Details ?Name: Casey Silva ?MRN: [id number] ?DOB: 22-Mar-1956 ?Today's Date: 06/14/2021 ? ? ?History of Present Illness Pt admit due to severe depression and SI thoughts. Pt states he has had a decline over past month with decreased motivation therefore decreased ability to perform basic tasks for himself and basic mobility making him more weak and with some falls. Pt's gait was very shuffled and unsteady on admission, however today seemed to be slowly improving. Pt lives in a boarding home ( apartment) with individual bedroom and bathroom, but shared kitchen, common area. PMH: anxiety, depression. ? ?  ?PT Comments  ? ? Pt outside during their " outside time" but agreed to work with me a little. Assessed and pt reported as well that he is doing better with his walking. Seemed to have increased activity tolerance, increased speed inwalking, no apparent "shuffling" gait, however still with decreased foot clearnance and smaller steps but not "scuffing his feet. He is able to correct when asked to take bigger steps and pick feet up more, and tries to continue this for a while. His upright posture is improved, however had to correct 3-5 times to stand more upright during activities, Worked on side stepping R and L and pt had some LOB, but able to sefl correct, more so made him nervous as our time together increased. He stated he has tried to work on the wall exercises for upright posture 1-2 times. I reviewed them and he stated he would try. I also encouraged him to walk more when he gets home to keep this up , because he also enjoys it. He said he would try but I also think he feels anxious an nervous about getting out.   ?No further acute PT needs at this time, he has met our fuctioal goals, and showed improvement. To sign off at this time.  ? ?  ?Recommendations for follow up therapy are one component of a multi-disciplinary discharge planning process, led by the attending physician.   Recommendations may be updated based on patient status, additional functional criteria and insurance authorization. ? ?Follow Up Recommendations ? No PT follow up ?  ?  ?Assistance Recommended at Discharge    ?Patient can return home with the following   ?  ?Equipment Recommendations ? None recommended by PT  ?  ?Recommendations for Other Services   ? ? ?  ?Precautions / Restrictions Precautions ?Precautions: Fall ?Precaution Comments: pt reports he has recetnly falling due to his body getting ahead of his feet when walking. " my feet shuffle" ?Restrictions ?Weight Bearing Restrictions: No  ?  ? ?Mobility ? Bed Mobility ?Overal bed mobility: Independent ?  ?  ?  ?  ?  ?  ?  ?  ? ?Transfers ?Overall transfer level: Independent ?  ?  ?  ?  ?  ?  ?  ?  ?  ?  ? ?Ambulation/Gait ?Ambulation/Gait assistance: Modified independent (Device/Increase time) ?Gait Distance (Feet): 400 Feet ?Assistive device: None ?Gait Pattern/deviations: Step-through pattern, Shuffle ?  ?  ?  ?General Gait Details: worked with pt outside on uneven terrain and sidewalk with socks . Pt with improved gait pattern , decreased shuffling , however still some small steps and decreased foot clearance ? ? ?Stairs ?  ?  ?  ?  ?  ? ? ?Wheelchair Mobility ?  ? ?Modified Rankin (Stroke Patients Only) ?  ? ? ?  ?Balance   ?  ?  ?  ?  ?  ?  ?  ?  ?  ?  ?  ?  ?  ?  ?  ?  ?  ?  ?  ? ?  ?  Cognition Arousal/Alertness: Awake/alert ?Behavior During Therapy: [facility] for tasks assessed/performed ?Overall Cognitive Status: Within Functional Limits for tasks assessed ?  ?  ?  ?  ?  ?  ?  ?  ?  ?  ?  ?  ?  ?  ?  ?  ?General Comments: will differ higher level cognitive assessment to OT ?  ?  ? ?  ?Exercises   ? ?  ?General Comments   ?  ?  ? ?Pertinent Vitals/Pain    ? ? ?Home Living   ?  ?  ?  ?  ?  ?  ?  ?  ?  ?   ?  ?Prior Function    ?  ?  ?   ? ?PT Goals (current goals can now be found in the care plan section) Acute Rehab PT Goals ?Patient Stated Goal: Pt has improved  gait and walking , with improved balance, no further acute PT needs at this time. ?PT Goal Formulation: All assessment and education complete, DC therapy ?Additional Goals ?Additional Goal #1: was unable to complete 6 minute test due to outside at recess time and pt wanted to sita afeter we completed some reassement and walkin activities. Pt was on feet more, improved strength and acitivty tolerance, with increased speed as well. ?Progress towards PT goals: Goals met/education completed, patient discharged from PT ? ?  ?Frequency ? ? ?   ? ? ? ?  ?PT Plan Current plan remains appropriate;Other (comment) (no further acute PT needs at this time and feel pt does not need HHPT at this point either.)  ? ? ?Co-evaluation   ?  ?  ?  ?  ? ?  ?AM-PAC PT "6 Clicks" Mobility   ?Outcome Measure ? Help needed turning from your back to your side while in a flat bed without using bedrails?: None ?Help needed moving from lying on your back to sitting on the side of a flat bed without using bedrails?: None ?Help needed moving to and from a bed to a chair (including a wheelchair)?: None ?Help needed standing up from a chair using your arms (e.g., wheelchair or bedside chair)?: None ?Help needed to walk in hospital room?: None ?Help needed climbing 3-5 steps with a railing? : None ?6 Click Score: 24 ? ?  ?End of Session   ?Activity Tolerance: Patient tolerated treatment well ?Patient left: in chair ?Nurse Communication: Mobility status ?  ?  ? ? ?Time: 2395-3202 ?PT Time Calculation (min) (ACUTE ONLY): 15 min ? ?Charges:  $Gait Training: 8-22 mins          ?          ? ?Larsen Dungan, PT, MPT ?Acute Rehabilitation Services ?Office: [phone] ?Pager: [phone] ?06/14/2021 ? ? ? Clide Dales ?06/14/2021, 5:06 PM ? ?

## 2021-06-14 NOTE — BHH Counselor (Signed)
CSW received a call from [place] worker, Towanda Malkin; [phone], re: patient.  CSW called back and left a HIPPA compliant voicemail for a call back.  ? ? ?Satoru Milich, LCSW, LCAS ?Clincal Social Worker  ?[facility] ? ?

## 2021-06-15 NOTE — Progress Notes (Signed)
[facility] MD Progress Note  06/15/2021 3:59 PM Casey Silva  MRN:  [id number]  Subjective: Casey Silva states: "I find myself at this age, and nothing. The medication is slightly helpful, but I am still anxious though."  Today's assessment (06/15/2021): Pt's chart reviewed, case discussed with treatment team. Pt continues to present with a blunted affect and depressed mood, attention to personal hygiene and grooming is fair, eye contact is good, speech is clear & coherent. Pt continues to ambulate with a shuffling gait, but no tremors visible. Thought contents are organized and logical. Pt reports feeling suicidal earlier in the morning, but currently endorses passive SI, and states that if he were to go to sleep and not wake up, it would be okay with them. Pt denies HI/AVH, he endorses paranoia (thinks people are out to get him).  Pt was started on a low dose Zyprexa at 2.5 mg yesterday for management of depression and anxiety. He was also started on  Gabapentin 100 mg TID to help with anxiety/pain. Rationales, benefits and possible side effects of medications explained to pt. He continues to be very restless and depressed, verbalizes feeling anxious because he has found himself at an advanced age with no support person. He is wanting to go back to his boarding home so that he can pay his rent, but he is not mentally stable at this time for discharge. Social worker has been notified to call the owner of the boarding home after obtaining consent from pt, to inform him that pt is hospitalized and will pay rent on discharge. Social work will also be notified to explore getting pt accepted into adult day care programs such as [facility] since his loneliness is a stressor. Chart review for the past 24 hrs portray that he has been in groups and participating and is compliant with his scheduled medications.  Social work has filed APS report on pt, and awaiting outcome. SW to continue to follow up with this.  Na on admission  was 130, with a slight improvement yesterday to 133. Labs reviewed. LDL-123, EKG from 2/28 with prolonged QTC at 504, repeated EKG-NSR and QTC of 463. Effexor was increased  to 75 mg daily effective (3/7) for management of depressive symptoms. Sodium level on 3/2 was 130, and was 133 yesterday (3/5). Will recheck Na level tomorrow (6/9) to make sure that it is continuing to trend up with the increase in Effexor. Will continue current medications.  Reason for Admission:As per HPI from pt's initial presentation to the [facility]: "Casey Silva is a 66 y.o. male with history of anxiety, depression, and bipolar 1 disorder who presents to the emergency department with complaints of anxiety, depression, and suicidal ideation.  Patient states he ran out of his psychiatric medicines 3 days ago which is led to a significant decline in his mental health.  He states he feels very anxious.  He states he just does not want to deal with everything anymore."  Principal Problem: MDD (major depressive disorder), recurrent, severe, with psychosis ([place]) Diagnosis: Principal Problem:   MDD (major depressive disorder), recurrent, severe, with psychosis ([place]) Active Problems:   GAD (generalized anxiety disorder)   Tobacco user  Past Psychiatric History:  See H&P  Past Medical History:  Past Medical History:  Diagnosis Date   Anxiety    Asthma    Depression    History reviewed. No pertinent surgical history. Family History:  Family History  Problem Relation Age of Onset   Bipolar disorder Mother  Family Psychiatric  History: See H&P Social History:  Social History   Substance and Sexual Activity  Alcohol Use No     Social History   Substance and Sexual Activity  Drug Use No    Social History   Socioeconomic History   Marital status: Single    Spouse name: Not on file   Number of children: 1   Years of education: Not on file   Highest education level: GED or equivalent  Occupational History   Not on  file  Tobacco Use   Smoking status: Never   Smokeless tobacco: Never  Vaping Use   Vaping Use: Unknown  Substance and Sexual Activity   Alcohol use: No   Drug use: No   Sexual activity: Not Currently  Other Topics Concern   Not on file  Social History Narrative   Marital status:  Single   Children: Daughter   Employment:  Unemployment. previous work for [employer].   Alcohol:  None   Drugs:  none   Exercise:  Regularly very   Religion:  Jewish   Social Determinants of Radio broadcast assistant Strain: Not on file  Food Insecurity: Not on file  Transportation Needs: Not on file  Physical Activity: Not on file  Stress: Not on file  Social Connections: Not on file   Additional Social History:    Sleep: Good  Appetite:  Good  Current Medications: Current Facility-Administered Medications  Medication Dose Route Frequency Provider Last Rate Last Admin   acetaminophen (TYLENOL) tablet 650 mg  650 mg Oral Q6H PRN Suella Broad, FNP   650 mg at 06/11/21 1430   alum & mag hydroxide-simeth (MAALOX/MYLANTA) 200-200-20 MG/5ML suspension 30 mL  30 mL Oral Q4H PRN Starkes-Perry, Gayland Curry, FNP       amLODipine (NORVASC) tablet 10 mg  10 mg Oral Daily Nicholes Rough, NP   10 mg at 06/15/21 0813   diclofenac Sodium (VOLTAREN) 1 % topical gel 2 g  2 g Topical QID Damita Dunnings B, MD   2 g at 06/12/21 1314   feeding supplement (ENSURE ENLIVE / ENSURE PLUS) liquid 237 mL  237 mL Oral BID BM Giann Obara, NP   237 mL at 06/15/21 1305   gabapentin (NEURONTIN) capsule 100 mg  100 mg Oral TID Nicholes Rough, NP   100 mg at 06/15/21 1305   influenza vac split quadrivalent PF (FLUARIX) injection 0.5 mL  0.5 mL Intramuscular Tomorrow-1000 Ian Cavey, NP       magnesium hydroxide (MILK OF MAGNESIA) suspension 30 mL  30 mL Oral Daily PRN Suella Broad, FNP   30 mL at 06/09/21 1611   melatonin tablet 5 mg  5 mg Oral QHS Suella Broad, FNP   5 mg at 06/14/21 2122    mirtazapine (REMERON) tablet 30 mg  30 mg Oral QHS Suella Broad, FNP   30 mg at 06/14/21 2122   multivitamin with minerals tablet 1 tablet  1 tablet Oral Daily Harlow Asa, MD   1 tablet at 06/15/21 0812   OLANZapine (ZYPREXA) tablet 2.5 mg  2.5 mg Oral Daily Nicholes Rough, NP   2.5 mg at 06/15/21 0813   OLANZapine (ZYPREXA) tablet 5 mg  5 mg Oral QHS Natahlia Hoggard, NP   5 mg at 06/14/21 2121   propranolol (INDERAL) tablet 10 mg  10 mg Oral BID Harlow Asa, MD   10 mg at 06/15/21 0812   traZODone ([place])  tablet 100 mg  100 mg Oral QHS Kanav Kazmierczak, NP   100 mg at 06/14/21 2122   venlafaxine XR (EFFEXOR-XR) 24 hr capsule 75 mg  75 mg Oral Q breakfast Nicholes Rough, NP   75 mg at 06/15/21 5003   Vitamin D (Ergocalciferol) (DRISDOL) capsule 50,000 Units  50,000 Units Oral Q7 days Harlow Asa, MD   50,000 Units at 06/11/21 1926   Lab Results:  No results found for this or any previous visit (from the past 102 hour(s)).  Blood Alcohol level:  Lab Results  Component Value Date   ETH <10 06/07/2021   ETH <10 70/48/8891   Metabolic Disorder Labs: Lab Results  Component Value Date   HGBA1C 5.1 06/07/2021   MPG 99.67 06/07/2021   MPG 111.15 11/24/2020   No results found for: PROLACTIN Lab Results  Component Value Date   CHOL 197 06/07/2021   TRIG 149 06/07/2021   HDL 44 06/07/2021   CHOLHDL 4.5 06/07/2021   VLDL 30 06/07/2021   LDLCALC 123 (H) 06/07/2021   LDLCALC 148 (H) 11/24/2020   Physical Findings: AIMS: Facial and Oral Movements Muscles of Facial Expression: None, normal Lips and Perioral Area: None, normal Jaw: None, normal Tongue: None, normal,Extremity Movements Upper (arms, wrists, hands, fingers): None, normal Lower (legs, knees, ankles, toes): None, normal, Trunk Movements Neck, shoulders, hips: None, normal, Overall Severity Severity of abnormal movements (highest score from questions above): None, normal Incapacitation due to abnormal  movements: None, normal Patient's awareness of abnormal movements (rate only patient's report): No Awareness, Dental Status Current problems with teeth and/or dentures?: No Does patient usually wear dentures?: No   Musculoskeletal: Strength & Muscle Tone: within normal limits Gait & Station: shuffle Patient leans: Front  Psychiatric Specialty Exam:  Presentation  General Appearance: disheveled in scrubs, hair uncombed  Eye Contact:Fair  Speech:slow, normal fluency  Speech Volume:Normal  Handedness:Right  Mood and Affect  Mood: Described as anxious  Affect:anxious, restricted  Thought Process  Thought Processes:Coherent  Orientation:Full (Time, Place and Person)  Thought Content:Denies AVH, paranoia, ideas of reference, first rank symptoms, or HI;  History of Schizophrenia/Schizoaffective disorder:No  Duration of Psychotic Symptoms:Greater than six months  Hallucinations:Hallucinations: None   Ideas of Reference:None  Suicidal Thoughts:Suicidal Thoughts: Yes, Passive SI Active Intent and/or Plan: Without Plan; Without Intent SI Passive Intent and/or Plan: Without Plan; Without Intent   Homicidal Thoughts:Homicidal Thoughts: No  Sensorium  Memory:Immediate Good  Judgment:Fair  Insight:Fair  Executive Functions  Concentration:Fair  Attention Span:Fair  [place]  Psychomotor Activity  Psychomotor Activity:Psychomotor Activity: Psychomotor Retardation; Restlessness; Shuffling Gait  Assets  Assets:Communication Skills; Housing  Sleep  6 hours  MOCA: Visuospatial/ Executive: 4/5 (no cube) Naming: 3/3 Memory: good Attention: 5/6 ( 3 answers on Serial 7's) Language: 2/3( only 7 "F" words) Abstraction 2/2 Delayed Recall: 5/5 Orientation: 6/6 11th grade education + 1 =28/30  Physical Exam HENT:     Head: Normocephalic and atraumatic.  Pulmonary:     Effort: Pulmonary effort is normal.   Neurological:     Mental Status: He is alert and oriented to person, place, and time.   Review of Systems  Cardiovascular:  Negative for chest pain.  Musculoskeletal:  Negative for back pain.  Neurological:  Negative for headaches.  Psychiatric/Behavioral:  Positive for depression. Negative for hallucinations and suicidal ideas. The patient is nervous/anxious. The patient does not have insomnia.        Passive SI  Blood pressure  127/82, pulse 79, temperature 97.9 F (36.6 C), temperature source Oral, resp. rate 14, height 5' 6.54" (1.69 m), weight 71.8 kg, SpO2 99 %. Body mass index is 25.13 kg/m.   MDD recurrent severe with psychotic features GAD by hx   PLAN: Safety and Monitoring:             -- Voluntary admission to inpatient psychiatric unit for safety, stabilization and treatment             -- Daily contact with patient to assess and evaluate symptoms and progress in treatment             -- Patient's case to be discussed in multi-disciplinary team meeting             -- Observation Level : q15 minute checks             -- Vital signs:  q12 hours             -- Precautions: suicide, elopement, and assault   2. Psychiatric Diagnoses and Treatment:              MDD recurrent severe with psychotic features GAD by hx -Continue Zyprexa 2.5 mg in the mornings -Continue Zyprexa 5 mg nightly for psychosis (monitoring of QTC) - Continue Effexor XR to 75 mg and monitor Na+ closely, and repeat on 3/9 to make sure it is continuing to trend up. -Continue Remeron 30 mg nightly  -Discontinued Hydroxyzine 25 mg PRN as it might be contributing to restlessness in patient.  - Continue Inderal '10mg'$  BID, for anxiety -Continue Gabapentin 100 mg TID for anxiety - Prompting to attend to ADLs and to eat  - MOCA 28/30 - Continue room lock out for meals and groups             -- Metabolic profile and EKG monitoring obtained while on an atypical antipsychotic (Lipid Panel: WNL except for LDL 123;  HbgA1c:5.1; QTc:427m, B12- WNL, Vit D- 14.94, Folate- WNL) Repeat EKG- 463 (3/5)             -- Encouraged patient to participate in unit milieu and in scheduled group therapies                Insomnia -Continue Trazodone 100 mg nightly (monitoring QTC) -Continue Melatonin 5 mg nightly  - Improved sleep hygiene encouraged                          3. Medical Issues Being Addressed:              Hyponatremia - Trending BMP for monitoring, Na+ 133 (3/5) - Hope will correct as po intake improves, thus far improving   Prolonged QTC - resolved - EKG shows QTC 4626m 463 on repeat               HTN             -- Continue Norvasc '10mg'$  daily               Low Vit D             -- Continue Vit D 50,000 weekly   Self-care deficits - PT and OT consults obtained for ambulation and assessment for ability to function independently after discharge (PT helping with posture and gait and OT feels he has physical and cognitive abilities to function independently but has psychological challenges limiting his perfomance in IADLs. PT eval  to be completed tomorrow (3/7). - Ensure tid and po intake encouraged - MVI ordered daily - SW placed APS referral, 3/5   Back Pain - Volatren gel PRN for back pain and encouraged to be OOB   4. Discharge Planning:              -- Social work and case management to assist with discharge planning and identification of hospital follow-up needs prior to discharge             -- Estimated LOS: 7-10 days             -- Discharge Concerns: Need to establish a safety plan; Medication compliance and effectiveness             -- Discharge Goals: Return home with outpatient referrals for mental health follow-up including medication management/psychotherapy  Nicholes Rough, NP 06/15/2021, 3:59 PM Patient ID: Casey Silva, male   DOB: 1955/07/15, 66 y.o.   MRN: [id number]

## 2021-06-15 NOTE — Progress Notes (Signed)
?   06/14/21 2122  ?Psych Admission Type (Psych Patients Only)  ?Admission Status Voluntary  ?Psychosocial Assessment  ?Patient Complaints Anxiety;Tension;Worrying  ?Eye Contact Fair  ?Facial Expression Worried  ?Affect Appropriate to circumstance  ?Speech Logical/coherent  ?Interaction Guarded;Minimal  ?Motor Activity Slow  ?Appearance/Hygiene In scrubs  ?Behavior Characteristics Cooperative;Appropriate to situation  ?Mood Pleasant  ?Thought Process  ?Coherency WDL  ?Content WDL  ?Delusions None reported or observed  ?Perception WDL  ?Hallucination None reported or observed  ?Judgment Impaired  ?Confusion None  ?Danger to Self  ?Current suicidal ideation? Passive  ?Self-Injurious Behavior No self-injurious ideation or behavior indicators observed or expressed   ?Agreement Not to Harm Self No  ?Description of Agreement Verbal contract  ?Danger to Others  ?Danger to Others None reported or observed  ? ? ?

## 2021-06-15 NOTE — Progress Notes (Signed)
?  ?   ?   ?  RN reviewed and is in agreement with Alease Medina'  [employer] student) flowsheets charting this shift.  ?  ?  ? ?

## 2021-06-15 NOTE — [facility] Group Notes (Signed)
Patient did not attend morning orientation group.  

## 2021-06-15 NOTE — Progress Notes (Signed)
Pt says his anxiety and depression have improved.  Pt Rated both at a 7 out of 10 with 10 being the highest amount.  Pt continues to endorse passive SI with no plan. Pt contracts for safety on the unit.  RN provided support and encouragement.  RN will continue to monitor pt's status and intervene as needed. ? 06/15/21 0815  ?Psych Admission Type (Psych Patients Only)  ?Admission Status Voluntary  ?Psychosocial Assessment  ?Patient Complaints Anxiety;Depression;Nervousness  ?Eye Contact Fair  ?Facial Expression Flat;Worried  ?Affect Anxious;Appropriate to circumstance  ?Speech Logical/coherent  ?Interaction Guarded;Minimal  ?Motor Activity Slow  ?Appearance/Hygiene In scrubs;Poor hygiene  ?Behavior Characteristics Cooperative;Appropriate to situation  ?Mood Pleasant;Anxious  ?Thought Process  ?Coherency WDL  ?Content WDL  ?Delusions None reported or observed  ?Perception WDL  ?Hallucination None reported or observed  ?Judgment Impaired  ?Confusion None  ?Danger to Self  ?Current suicidal ideation? Passive  ?Self-Injurious Behavior No self-injurious ideation or behavior indicators observed or expressed   ?Agreement Not to Harm Self No  ?Danger to Others  ?Danger to Others None reported or observed  ? ? ?

## 2021-06-15 NOTE — Progress Notes (Signed)
?   06/15/21 2106  ?Psych Admission Type (Psych Patients Only)  ?Admission Status Voluntary  ?Psychosocial Assessment  ?Patient Complaints Anxiety;Panic attack;Worrying;Tension;Nervousness  ?Eye Contact Fair  ?Facial Expression Anxious;Worried  ?Affect Anxious;Appropriate to circumstance  ?Speech Logical/coherent  ?Interaction Guarded;Minimal  ?Motor Activity Slow  ?Appearance/Hygiene In scrubs;Disheveled;Poor hygiene  ?Behavior Characteristics Cooperative;Appropriate to situation  ?Mood Anxious  ?Thought Process  ?Coherency WDL  ?Content WDL  ?Delusions None reported or observed  ?Perception WDL  ?Hallucination None reported or observed  ?Judgment Impaired  ?Confusion None  ?Danger to Self  ?Current suicidal ideation? Passive  ?Self-Injurious Behavior No self-injurious ideation or behavior indicators observed or expressed   ?Agreement Not to Harm Self No  ?Description of Agreement Verbal contract  ?Danger to Others  ?Danger to Others None reported or observed  ? ? ?

## 2021-06-15 NOTE — Group Note (Signed)
Recreation Therapy Group Note ? ? ?Group Topic:Stress Management  ?Group Date: 06/15/2021 ?Start Time: [phone] ?End Time: 4680 ?Facilitators: Victorino Sparrow, LRT,CTRS ?Location: [place] ? ? ?Goal Area(s) Addresses:  ?Patient will identify positive stress management techniques. ?Patient will identify benefits of using stress management post d/c. ? ?Group Description:  Meditation.  LRT played a meditation that focused on letting go of the past and not letting it inhibit progress moving forward.  It also focused on not dwelling on things that you can't change because they have already happened.  Patients were to listen to the meditation and follow along as it played to fully engage.  ? ? ?Affect/Mood: Flat ?  ?Participation Level: Minimal ?  ?Participation Quality: Independent ?  ?Behavior: Restless ?  ?Speech/Thought Process: Distracted ?  ?Insight: Poor ?  ?Judgement: Poor ?  ?Modes of Intervention: Meditation ?  ?Patient Response to Interventions:  Disengaged ?  ?Education Outcome: ? Acknowledges education and In group clarification offered   ? ?Clinical Observations/Individualized Feedback: Pt attended but seemed restless and unable to focus.  Pt left early and did not return.  ? ? ?Plan: Continue to engage patient in RT group sessions 2-3x/week. ? ? ?Victorino Sparrow, LRT,CTRS ?06/15/2021 1:37 PM ?

## 2021-06-15 NOTE — Group Note (Signed)
LCSW Group Therapy Note ? ? ?Group Date: 06/15/2021 ?Start Time: 1300 ?End Time: 1400 ? ?Type of Group and Topic: Psychoeducational Group: Discharge Planning ? ?Participation Level: Active ? ?Description of Group: ?Discharge planning group reviews patient's anticipated discharge plans and assists patients to anticipate and address any barriers to wellness/recovery in the community. Suicide prevention education is reviewed with patients in group. ? ?Therapeutic Goals ?1. Patients will state their anticipated discharge plan and mental health aftercare ?2. Patients will identify potential barriers to wellness in the community setting ?3. Patients will engage in problem solving, solution focused discussion of ways to anticipate and address barriers to wellness/recovery ? ?Summary of Patient Progress: The Pt attended group and remained the entire time.  The Pt accepted the worksheet provided and followed along throughout the group.  The Pt participated in the group discussion and interacted well with their peers.  The Pt demonstrated understanding of the topic that was dicussed.  ? ?Darleen Crocker, LCSWA ?06/15/2021  2:02 PM   ? ?

## 2021-06-16 ENCOUNTER — Telehealth (HOSPITAL_COMMUNITY): Payer: No Payment, Other | Admitting: Psychiatry

## 2021-06-16 DIAGNOSIS — E559 Vitamin D deficiency, unspecified: Secondary | ICD-10-CM | POA: Diagnosis present

## 2021-06-16 LAB — COMPREHENSIVE METABOLIC PANEL
ALT: 20 U/L (ref 0–44)
AST: 18 U/L (ref 15–41)
Albumin: 3.7 g/dL (ref 3.5–5.0)
Alkaline Phosphatase: 53 U/L (ref 38–126)
Anion gap: 9 (ref 5–15)
BUN: 16 mg/dL (ref 8–23)
CO2: 24 mmol/L (ref 22–32)
Calcium: 8.9 mg/dL (ref 8.9–10.3)
Chloride: 102 mmol/L (ref 98–111)
Creatinine, Ser: 0.86 mg/dL (ref 0.61–1.24)
GFR, Estimated: >60 mL/min (ref >60)
Glucose, Bld: 84 mg/dL (ref 70–99)
Potassium: 3.6 mmol/L (ref 3.5–5.1)
Sodium: 135 mmol/L (ref 135–145)
Total Bilirubin: 0.3 mg/dL (ref 0.3–1.2)
Total Protein: 6.5 g/dL (ref 6.5–8.1)

## 2021-06-16 NOTE — BHH Counselor (Signed)
Per provider, Tamela Oddi, patient has increased anxiety due to being in the hospital and rent being due for the boarding house.  Patient has no way of contacting landlord to let them know that he is in the hospital.  Patient provided CSW with landlord name but reports he doesn't have phone number for his landlord.  Patient could not provide contact information for any additional people that would have his contact information.  CSW unable to assist at this time due to not having contact information.  ? ?Landlord Name: Hazeline Junker ? ? ?Selestino Nila, LCSW, LCAS ?Clincal Social Worker  ?[facility] ? ?

## 2021-06-16 NOTE — Progress Notes (Signed)
[facility] MD Progress Note  06/16/2021 2:53 PM Casey Silva  MRN:  [id number]  Subjective: Casey Silva states: "I am a little better today, but still feeling a little bit suicidal. I have a knife at home."  Today's assessment (06/16/2021): Pt's chart reviewed, case discussed with treatment team. Pt continues to present with a blunted affect and depressed mood, attention to personal hygiene and grooming is fair, eye contact is good, speech is clear & coherent. Pt continues to ambulate with a shuffling gait, but no tremors visible. Thought contents are organized and logical. Pt reports feeling suicidal, denies having a plan, verbally contracts for safety in this hospital. When asked if pt is having plans to hurt himself, he reported that he has a knife at home. Pt reports +AH of bells, states that he last heard them last night. He denies VH, denies HI, denies paranoia/delusional thoughts.   He reports a fair sleep quality last night, reported feeling groggy earlier this morning, but stated that the grogginess resolved as the day went by. Pt rated his anxiety as 7 (10 being worst), rated his depression as 8 (10 being worst), and he reports a good appetite. Pt continues to report being worried about being in the hospital and his rent not being paid. Social worker made aware. Pt able to provide the name of his landlord, but denies having a phone number for him, and denies having a phone in the lockers which can be retrieved to get the number from.  Patient's treatment team discussed the possibility of getting pt referred into adult day care programs such as [facility] since his loneliness is a stressor. However, pt has no health insurance coverage at this time to pursue this option.   Na on admission was 130, with a slight improvement yesterday to 133. Labs reviewed. LDL-123, EKG from 2/28 with prolonged QTC at 504, repeated EKG-NSR and QTC of 463. Effexor was increased  to 75 mg daily effective (3/7) for management of  depressive symptoms. Sodium level on 3/2 was 130, and was 133 yesterday (3/5). Na level rechecked today (6/9) to make sure that it is continuing to trend up with the increase in Effexor. It is currently WNL at 135. Will continue current medications.  Reason for Admission: As per HPI from pt's initial presentation to the [facility]: "Casey Silva is a 66 y.o. male with history of anxiety, depression, and bipolar 1 disorder who presents to the emergency department with complaints of anxiety, depression, and suicidal ideation.  Patient states he ran out of his psychiatric medicines 3 days ago which is led to a significant decline in his mental health.  He states he feels very anxious.  He states he just does not want to deal with everything anymore."  Principal Problem: MDD (major depressive disorder), recurrent, severe, with psychosis ([place]) Diagnosis: Principal Problem:   MDD (major depressive disorder), recurrent, severe, with psychosis ([place]) Active Problems:   GAD (generalized anxiety disorder)   Tobacco user  Past Psychiatric History:  See H&P  Past Medical History:  Past Medical History:  Diagnosis Date   Anxiety    Asthma    Depression    History reviewed. No pertinent surgical history. Family History:  Family History  Problem Relation Age of Onset   Bipolar disorder Mother    Family Psychiatric  History: See H&P Social History:  Social History   Substance and Sexual Activity  Alcohol Use No     Social History   Substance and Sexual Activity  Drug Use No    Social History   Socioeconomic History   Marital status: Single    Spouse name: Not on file   Number of children: 1   Years of education: Not on file   Highest education level: GED or equivalent  Occupational History   Not on file  Tobacco Use   Smoking status: Never   Smokeless tobacco: Never  Vaping Use   Vaping Use: Unknown  Substance and Sexual Activity   Alcohol use: No   Drug use: No   Sexual activity: Not  Currently  Other Topics Concern   Not on file  Social History Narrative   Marital status:  Single   Children: Daughter   Employment:  Unemployment. previous work for [employer].   Alcohol:  None   Drugs:  none   Exercise:  Regularly very   Religion:  Jewish   Social Determinants of Radio broadcast assistant Strain: Not on file  Food Insecurity: Not on file  Transportation Needs: Not on file  Physical Activity: Not on file  Stress: Not on file  Social Connections: Not on file   Additional Social History:    Sleep: Good  Appetite:  Good  Current Medications: Current Facility-Administered Medications  Medication Dose Route Frequency Provider Last Rate Last Admin   acetaminophen (TYLENOL) tablet 650 mg  650 mg Oral Q6H PRN Suella Broad, FNP   650 mg at 06/11/21 1430   alum & mag hydroxide-simeth (MAALOX/MYLANTA) 200-200-20 MG/5ML suspension 30 mL  30 mL Oral Q4H PRN Starkes-Perry, Gayland Curry, FNP       amLODipine (NORVASC) tablet 10 mg  10 mg Oral Daily Nicholes Rough, NP   10 mg at 06/16/21 3244   diclofenac Sodium (VOLTAREN) 1 % topical gel 2 g  2 g Topical QID Damita Dunnings B, MD   2 g at 06/12/21 1314   feeding supplement (ENSURE ENLIVE / ENSURE PLUS) liquid 237 mL  237 mL Oral BID BM Janesia Joswick, NP   237 mL at 06/15/21 1629   gabapentin (NEURONTIN) capsule 100 mg  100 mg Oral TID Nicholes Rough, NP   100 mg at 06/16/21 1217   influenza vac split quadrivalent PF (FLUARIX) injection 0.5 mL  0.5 mL Intramuscular Tomorrow-1000 Laquenta Whitsell, NP       magnesium hydroxide (MILK OF MAGNESIA) suspension 30 mL  30 mL Oral Daily PRN Suella Broad, FNP   30 mL at 06/09/21 1611   melatonin tablet 5 mg  5 mg Oral QHS Suella Broad, FNP   5 mg at 06/15/21 2106   mirtazapine (REMERON) tablet 30 mg  30 mg Oral QHS Suella Broad, FNP   30 mg at 06/15/21 2106   multivitamin with minerals tablet 1 tablet  1 tablet Oral Daily Harlow Asa, MD   1  tablet at 06/16/21 0823   OLANZapine (ZYPREXA) tablet 2.5 mg  2.5 mg Oral Daily Donita Newland, Tamela Oddi, NP   2.5 mg at 06/16/21 0823   OLANZapine (ZYPREXA) tablet 5 mg  5 mg Oral QHS Lorielle Boehning, NP   5 mg at 06/15/21 2106   propranolol (INDERAL) tablet 10 mg  10 mg Oral BID Viann Fish E, MD   10 mg at 06/16/21 0102   traZODone (DESYREL) tablet 100 mg  100 mg Oral QHS Phuc Kluttz, NP   100 mg at 06/15/21 2106   venlafaxine XR (EFFEXOR-XR) 24 hr capsule 75 mg  75 mg Oral Q breakfast  Nicholes Rough, NP   75 mg at 06/16/21 4742   Vitamin D (Ergocalciferol) (DRISDOL) capsule 50,000 Units  50,000 Units Oral Q7 days Harlow Asa, MD   50,000 Units at 06/11/21 1926   Lab Results:  Results for orders placed or performed during the hospital encounter of 06/07/21 (from the past 48 hour(s))  Comprehensive metabolic panel     Status: None   Collection Time: 06/16/21  6:30 AM  Result Value Ref Range   Sodium 135 135 - 145 mmol/L   Potassium 3.6 3.5 - 5.1 mmol/L   Chloride 102 98 - 111 mmol/L   CO2 24 22 - 32 mmol/L   Glucose, Bld 84 70 - 99 mg/dL    Comment: Glucose reference range applies only to samples taken after fasting for at least 8 hours.   BUN 16 8 - 23 mg/dL   Creatinine, Ser 0.86 0.61 - 1.24 mg/dL   Calcium 8.9 8.9 - 10.3 mg/dL   Total Protein 6.5 6.5 - 8.1 g/dL   Albumin 3.7 3.5 - 5.0 g/dL   AST 18 15 - 41 U/L   ALT 20 0 - 44 U/L   Alkaline Phosphatase 53 38 - 126 U/L   Total Bilirubin 0.3 0.3 - 1.2 mg/dL   GFR, Estimated >60 >60 mL/min    Comment: (NOTE) Calculated using the CKD-EPI Creatinine Equation (2021)    Anion gap 9 5 - 15    Comment: Performed at [facility], [place] [address], [place], [place] [address]    Blood Alcohol level:  Lab Results  Component Value Date   ETH <10 06/07/2021   ETH <10 87/56/4332   Metabolic Disorder Labs: Lab Results  Component Value Date   HGBA1C 5.1 06/07/2021   MPG 99.67 06/07/2021   MPG 111.15 11/24/2020    No results found for: PROLACTIN Lab Results  Component Value Date   CHOL 197 06/07/2021   TRIG 149 06/07/2021   HDL 44 06/07/2021   CHOLHDL 4.5 06/07/2021   VLDL 30 06/07/2021   LDLCALC 123 (H) 06/07/2021   LDLCALC 148 (H) 11/24/2020   Physical Findings: AIMS: Facial and Oral Movements Muscles of Facial Expression: None, normal Lips and Perioral Area: None, normal Jaw: None, normal Tongue: None, normal,Extremity Movements Upper (arms, wrists, hands, fingers): None, normal Lower (legs, knees, ankles, toes): None, normal, Trunk Movements Neck, shoulders, hips: None, normal, Overall Severity Severity of abnormal movements (highest score from questions above): None, normal Incapacitation due to abnormal movements: None, normal Patient's awareness of abnormal movements (rate only patient's report): No Awareness, Dental Status Current problems with teeth and/or dentures?: No Does patient usually wear dentures?: No   Musculoskeletal: Strength & Muscle Tone: within normal limits Gait & Station: shuffle Patient leans: Front  Psychiatric Specialty Exam:  Presentation  General Appearance: disheveled in scrubs, hair uncombed  Eye Contact:Fair  Speech:slow, normal fluency  Speech Volume:Normal  Handedness:Right  Mood and Affect  Mood: Described as anxious  Affect:anxious, restricted  Thought Process  Thought Processes:Coherent  Orientation:Full (Time, Place and Person)  Thought Content:Denies AVH, paranoia, ideas of reference, first rank symptoms, or HI;  History of Schizophrenia/Schizoaffective disorder:No  Duration of Psychotic Symptoms:Greater than six months  Hallucinations:Hallucinations: Auditory; Visual   Ideas of Reference:None  Suicidal Thoughts:Suicidal Thoughts: Yes, Active SI Active Intent and/or Plan: Without Intent; Without Plan SI Passive Intent and/or Plan: Without Intent; Without Plan   Homicidal Thoughts:Homicidal Thoughts:  No  Sensorium  Memory:Immediate Good  Judgment:Fair  Insight:Fair  Executive Functions  Concentration:Fair  Attention Span:Fair  [place]  Psychomotor Activity  Psychomotor Activity:Psychomotor Activity: Psychomotor Retardation; Shuffling Gait; Restlessness  Assets  Assets:Communication Skills  Sleep  6 hours  MOCA: Visuospatial/ Executive: 4/5 (no cube) Naming: 3/3 Memory: good Attention: 5/6 ( 3 answers on Serial 7's) Language: 2/3( only 7 "F" words) Abstraction 2/2 Delayed Recall: 5/5 Orientation: 6/6 11th grade education + 1 =28/30  Physical Exam HENT:     Head: Normocephalic and atraumatic.  Pulmonary:     Effort: Pulmonary effort is normal.  Neurological:     Mental Status: He is alert and oriented to person, place, and time.   Review of Systems  Cardiovascular:  Negative for chest pain.  Musculoskeletal:  Negative for back pain.  Neurological:  Negative for headaches.  Psychiatric/Behavioral:  Positive for depression. Negative for hallucinations and suicidal ideas. The patient is nervous/anxious. The patient does not have insomnia.        Passive SI  Blood pressure 123/86, pulse 95, temperature 98.4 F (36.9 C), temperature source Oral, resp. rate 14, height 5' 6.54" (1.69 m), weight 71.8 kg, SpO2 98 %. Body mass index is 25.13 kg/m.   MDD recurrent severe with psychotic features GAD by hx   PLAN: Safety and Monitoring:             -- Voluntary admission to inpatient psychiatric unit for safety, stabilization and treatment             -- Daily contact with patient to assess and evaluate symptoms and progress in treatment             -- Patient's case to be discussed in multi-disciplinary team meeting             -- Observation Level : q15 minute checks             -- Vital signs:  q12 hours             -- Precautions: suicide, elopement, and assault   2. Psychiatric Diagnoses and Treatment:               MDD recurrent severe with psychotic features GAD by hx -Continue Zyprexa 2.5 mg in the mornings -Continue Zyprexa 5 mg nightly for psychosis (monitoring of QTC) - Continue Effexor XR to 75 mg and monitor Na+ closely, and repeat on 3/9 to make sure it is continuing to trend up. -Continue Remeron 30 mg nightly  -Discontinued Hydroxyzine 25 mg PRN as it might be contributing to restlessness in patient.  - Continue Inderal '10mg'$  BID, for anxiety -Continue Gabapentin 100 mg TID for anxiety - Prompting to attend to ADLs and to eat  - MOCA 28/30 - Continue room lock out for meals and groups             -- Metabolic profile and EKG monitoring obtained while on an atypical antipsychotic (Lipid Panel: WNL except for LDL 123; HbgA1c:5.1; QTc:444m, B12- WNL, Vit D- 14.94, Folate- WNL) Repeat EKG- 463 (3/5)             -- Encouraged patient to participate in unit milieu and in scheduled group therapies                Insomnia -Continue Trazodone 100 mg nightly (monitoring QTC) -Continue Melatonin 5 mg nightly  - Improved sleep hygiene encouraged  3. Medical Issues Being Addressed:              Hyponatremia - Trending BMP for monitoring, Na+ 135 (3/9) - Hope will correct as po intake improves, thus far improving   Prolonged QTC - resolved - EKG shows QTC 437m> 463 on repeat               HTN             -- Continue Norvasc '10mg'$  daily               Low Vit D             -- Continue Vit D 50,000 weekly   Self-care deficits - PT and OT consults obtained for ambulation and assessment for ability to function independently after discharge (PT helping with posture and gait and OT feels he has physical and cognitive abilities to function independently but has psychological challenges limiting his perfomance in IADLs. PT eval to be completed tomorrow (3/7). - Ensure tid and po intake encouraged - MVI ordered daily - SW placed APS referral, 3/5   Back Pain - Volatren  gel PRN for back pain and encouraged to be OOB   4. Discharge Planning:              -- Social work and case management to assist with discharge planning and identification of hospital follow-up needs prior to discharge             -- Estimated LOS: 7-10 days             -- Discharge Concerns: Need to establish a safety plan; Medication compliance and effectiveness             -- Discharge Goals: Return home with outpatient referrals for mental health follow-up including medication management/psychotherapy  DNicholes Rough NP 06/16/2021, 2:53 PM Patient ID: MTheoplis Garciagarcia male   DOB: [birth year] 66y.o.   MRN: [id number]

## 2021-06-16 NOTE — Progress Notes (Signed)
D. Pt continues to present depressed, states that he still thinks about death, but reports that his anxiety has improved. Pt remains somewhat guarded, and  isolative to room. . Pt currently denies SI/HI and AVH and agrees to contact staff before acting on any harmful thoughts.  ?A. Labs and vitals monitored. Pt compliant with meds. Pt supported emotionally and encouraged to express concerns and ask questions.   ?R. Pt remains safe with 15 minute checks. Will continue POC. ? ?  ?

## 2021-06-16 NOTE — Progress Notes (Signed)
CSW called patient APS worker, Towanda Malkin.  APS worker reported that they came and saw patient and did an assessment on 06/14/2021.  Caseworker reports she just got the case and it is to early to make a determination. She requested what psych recommendations were.  CSW discussed patient inability to consistently perform ADLs and how psych continues to tweak medications to improve motivation. APS worker took [place] email address and agreed to email social worker with any updates with the case.  ? ? ?Monicia Tse, LCSW, LCAS ?Clincal Social Worker  ?[facility] ? ?

## 2021-06-16 NOTE — BHH Group Notes (Signed)
PsychoEducational Group Note- ?The patients were educated on identifying negative behavioral patterns, and ways that negative patterns can impact mental health. A poem was read by Cristopher Peru, there's a hole in my sidewalk'' in regards to negative patterns. The patients were then asked to participate in sharing and identifying their own negative patterns. A second poem was shared by Donzetta Kohut ''the owl and the chimpanzee'' in which patients are asked to recognize fight or flight patterns with their own anxiety. The patient did not attend. ?

## 2021-06-16 NOTE — Progress Notes (Signed)
The patient rated his day as a 6 out of 10 since he attended one of the groups. He verbalized that he rested a great deal today and that he did not have a goal for today. His goal for tomorrow is to get discharged.  ?

## 2021-06-17 ENCOUNTER — Encounter (HOSPITAL_COMMUNITY): Payer: Self-pay

## 2021-06-17 MED ORDER — GABAPENTIN 100 MG PO CAPS
200.0000 mg | ORAL_CAPSULE | Freq: Three times a day (TID) | ORAL | Status: DC
  Administered 2021-06-18 – 2021-06-19 (×6): 200 mg via ORAL
  Filled 2021-06-17 (×11): qty 2

## 2021-06-17 NOTE — Plan of Care (Signed)
?  Problem: Education: ?Goal: Emotional status will improve ?Outcome: Not Progressing ?Goal: Mental status will improve ?Outcome: Not Progressing ?  ?Problem: Activity: ?Goal: Sleeping patterns will improve ?Outcome: Not Progressing ?  ?

## 2021-06-17 NOTE — Group Note (Signed)
LCSW Group Therapy Note ? ? ?Group Date: 06/17/2021 ?Start Time: 1300 ?End Time: 1400 ? ?Type of Therapy and Topic:  Group Therapy:  Self-Esteem ?  ?Participation Level:  Active ? ?Description of Group: ?This group addressed positive self-esteem. Patients were given a worksheet with a blank shield. Patients were asked what a shield is and when it is used. Patients were asked to list, draw, or write protective factors in the their lives on their shields. Patients discussed the words, ideas and drawings that they put on their shield. Patients were encouraged to have a daily reflection of positive characteristics/ protective factors. ? ?Therapeutic Goals ?Patient will verbalize two of their positive qualities ?Patient will demonstrate insight but naming social supports in their lives ?Patient will verbalize their feelings when voicing positive self affirmations and when voicing positive affirmations of others ?Patients will discuss the potential positive impact on their wellness/recovery of focusing on positive traits of self and others. ? ?Summary of Patient Progress:  The Pt attended group and remained there the entire time.  The Pt accepted the worksheet provided and followed along with the group discussion.  The Pt demonstrated understanding of the subject matter and was respectful towards their peers.  ? ?Darleen Crocker, LCSWA ?06/17/2021  1:41 PM   ? ?

## 2021-06-17 NOTE — BH IP Treatment Plan (Signed)
Interdisciplinary Treatment and Diagnostic Plan Update  06/17/2021 Time of Session: update- did not attend Casey Silva MRN: [id number]  Principal Diagnosis: MDD (major depressive disorder), recurrent, severe, with psychosis ([place])  Secondary Diagnoses: Principal Problem:   MDD (major depressive disorder), recurrent, severe, with psychosis ([place]) Active Problems:   GAD (generalized anxiety disorder)   Tobacco user   Vitamin D deficiency   Current Medications:  Current Facility-Administered Medications  Medication Dose Route Frequency Provider Last Rate Last Admin   acetaminophen (TYLENOL) tablet 650 mg  650 mg Oral Q6H PRN Suella Broad, FNP   650 mg at 06/11/21 1430   alum & mag hydroxide-simeth (MAALOX/MYLANTA) 200-200-20 MG/5ML suspension 30 mL  30 mL Oral Q4H PRN Starkes-Perry, Gayland Curry, FNP       amLODipine (NORVASC) tablet 10 mg  10 mg Oral Daily Nicholes Rough, NP   10 mg at 06/17/21 0746   diclofenac Sodium (VOLTAREN) 1 % topical gel 2 g  2 g Topical QID Damita Dunnings B, MD   2 g at 06/12/21 1314   feeding supplement (ENSURE ENLIVE / ENSURE PLUS) liquid 237 mL  237 mL Oral BID BM Nkwenti, Doris, NP   237 mL at 06/17/21 1505   gabapentin (NEURONTIN) capsule 100 mg  100 mg Oral TID Nicholes Rough, NP   100 mg at 06/17/21 1136   influenza vac split quadrivalent PF (FLUARIX) injection 0.5 mL  0.5 mL Intramuscular Tomorrow-1000 Nkwenti, Doris, NP       magnesium hydroxide (MILK OF MAGNESIA) suspension 30 mL  30 mL Oral Daily PRN Suella Broad, FNP   30 mL at 06/09/21 1611   melatonin tablet 5 mg  5 mg Oral QHS Suella Broad, FNP   5 mg at 06/16/21 2127   mirtazapine (REMERON) tablet 30 mg  30 mg Oral QHS Suella Broad, FNP   30 mg at 06/16/21 2127   multivitamin with minerals tablet 1 tablet  1 tablet Oral Daily Harlow Asa, MD   1 tablet at 06/17/21 0746   OLANZapine (ZYPREXA) tablet 2.5 mg  2.5 mg Oral Daily Nicholes Rough, NP   2.5 mg at  06/17/21 0746   OLANZapine (ZYPREXA) tablet 5 mg  5 mg Oral QHS Nkwenti, Doris, NP   5 mg at 06/16/21 2126   propranolol (INDERAL) tablet 10 mg  10 mg Oral BID Harlow Asa, MD   10 mg at 06/17/21 0746   traZODone (DESYREL) tablet 100 mg  100 mg Oral QHS Nkwenti, Doris, NP   100 mg at 06/16/21 2127   venlafaxine XR (EFFEXOR-XR) 24 hr capsule 75 mg  75 mg Oral Q breakfast Nicholes Rough, NP   75 mg at 06/17/21 0746   Vitamin D (Ergocalciferol) (DRISDOL) capsule 50,000 Units  50,000 Units Oral Q7 days Harlow Asa, MD   50,000 Units at 06/11/21 1926   PTA Medications: Medications Prior to Admission  Medication Sig Dispense Refill Last Dose   acetaminophen (TYLENOL) 500 MG tablet Take 1,000 mg by mouth every 6 (six) hours as needed for mild pain or headache.      amLODipine (NORVASC) 5 MG tablet Take 1 tablet (5 mg total) by mouth daily. For high blood pressure (Patient not taking: Reported on 06/07/2021) 30 tablet 0    docusate sodium (COLACE) 100 MG capsule Take 1 capsule (100 mg total) by mouth daily. (May buy from over the counter): For constipation (Patient taking differently: Take 100 mg by mouth daily as needed for  mild constipation. (May buy from over the counter): For constipation) 1 capsule 0    hydrOXYzine (ATARAX) 50 MG tablet Take 1 tablet (50 mg total) by mouth every 6 (six) hours as needed for anxiety. (Patient taking differently: Take 50-150 mg by mouth every 6 (six) hours as needed for anxiety.) 90 tablet 3    melatonin 5 MG TABS Take 1 tablet (5 mg total) by mouth at bedtime. For depression (Patient taking differently: Take 10 mg by mouth at bedtime. For depression) 30 tablet 3    mirtazapine (REMERON) 30 MG tablet Take 1 tablet (30 mg total) by mouth at bedtime. 30 tablet 3    polyethylene glycol (MIRALAX / GLYCOLAX) 17 g packet Take 17 g by mouth daily. (May buy from over the counter): For constipation (Patient taking differently: Take 17 g by mouth daily as needed for mild  constipation. (May buy from over the counter): For constipation) 1 each 0    risperiDONE (RISPERDAL) 3 MG tablet Take 1 tablet (3 mg total) by mouth at bedtime. For mood control 30 tablet 3    traZODone (DESYREL) 100 MG tablet Take 2 tablets (200 mg total) by mouth at bedtime. For sleep 60 tablet 3     Patient Stressors: Health problems   Medication change or noncompliance   Traumatic event    Patient Strengths: Ability for insight  Communication skills   Treatment Modalities: Medication Management, Group therapy, Case management,  1 to 1 session with clinician, Psychoeducation, Recreational therapy.   Physician Treatment Plan for Primary Diagnosis: MDD (major depressive disorder), recurrent, severe, with psychosis ([place]) Long Term Goal(s): Improvement in symptoms so as ready for discharge   Short Term Goals: Ability to identify changes in lifestyle to reduce recurrence of condition will improve Ability to disclose and discuss suicidal ideas Ability to demonstrate self-control will improve Ability to identify and develop effective coping behaviors will improve Ability to maintain clinical measurements within normal limits will improve Compliance with prescribed medications will improve Ability to identify triggers associated with substance abuse/mental health issues will improve  Medication Management: Evaluate patient's response, side effects, and tolerance of medication regimen.  Therapeutic Interventions: 1 to 1 sessions, Unit Group sessions and Medication administration.  Evaluation of Outcomes: Progressing  Physician Treatment Plan for Secondary Diagnosis: Principal Problem:   MDD (major depressive disorder), recurrent, severe, with psychosis ([place]) Active Problems:   GAD (generalized anxiety disorder)   Tobacco user   Vitamin D deficiency  Long Term Goal(s): Improvement in symptoms so as ready for discharge   Short Term Goals: Ability to identify changes in lifestyle to  reduce recurrence of condition will improve Ability to disclose and discuss suicidal ideas Ability to demonstrate self-control will improve Ability to identify and develop effective coping behaviors will improve Ability to maintain clinical measurements within normal limits will improve Compliance with prescribed medications will improve Ability to identify triggers associated with substance abuse/mental health issues will improve     Medication Management: Evaluate patient's response, side effects, and tolerance of medication regimen.  Therapeutic Interventions: 1 to 1 sessions, Unit Group sessions and Medication administration.  Evaluation of Outcomes: Progressing   RN Treatment Plan for Primary Diagnosis: MDD (major depressive disorder), recurrent, severe, with psychosis ([place]) Long Term Goal(s): Knowledge of disease and therapeutic regimen to maintain health will improve  Short Term Goals: Ability to remain free from injury will improve, Ability to verbalize frustration and anger appropriately will improve, Ability to demonstrate self-control, Ability to participate in decision  making will improve, Ability to verbalize feelings will improve, Ability to disclose and discuss suicidal ideas, Ability to identify and develop effective coping behaviors will improve, and Compliance with prescribed medications will improve  Medication Management: RN will administer medications as ordered by provider, will assess and evaluate patient's response and provide education to patient for prescribed medication. RN will report any adverse and/or side effects to prescribing provider.  Therapeutic Interventions: 1 on 1 counseling sessions, Psychoeducation, Medication administration, Evaluate responses to treatment, Monitor vital signs and CBGs as ordered, Perform/monitor CIWA, COWS, AIMS and Fall Risk screenings as ordered, Perform wound care treatments as ordered.  Evaluation of Outcomes:  Progressing   LCSW Treatment Plan for Primary Diagnosis: MDD (major depressive disorder), recurrent, severe, with psychosis ([employer] [employer]) Long Term Goal(s): Safe transition to appropriate next level of care at discharge, Engage patient in therapeutic group addressing interpersonal concerns.  Short Term Goals: Engage patient in aftercare planning with referrals and resources, Increase social support, Increase ability to appropriately verbalize feelings, Increase emotional regulation, Facilitate acceptance of mental health diagnosis and concerns, Facilitate patient progression through stages of change regarding substance use diagnoses and concerns, Identify triggers associated with mental health/substance abuse issues, and Increase skills for wellness and recovery  Therapeutic Interventions: Assess for all discharge needs, 1 to 1 time with Social worker, Explore available resources and support systems, Assess for adequacy in community support network, Educate family and significant other(s) on suicide prevention, Complete Psychosocial Assessment, Interpersonal group therapy.  Evaluation of Outcomes: Progressing   Progress in Treatment: Attending groups: Yes. Participating in groups: Yes. Taking medication as prescribed: Yes. Toleration medication: Yes. Family/Significant other contact made: No, will contact:  patient denied consents Patient understands diagnosis: Yes. Discussing patient identified problems/goals with staff: Yes. Medical problems stabilized or resolved: Yes. Denies suicidal/homicidal ideation: No. Issues/concerns per patient self-inventory: No. Other: none  New problem(s) identified: No, Describe:  none reported  New Short Term/Long Term Goal(s): medication stabilization, elimination of SI thoughts, development of comprehensive mental wellness plan.    Patient Goals:  "to get on the right medications"  Discharge Plan or Barriers: lack of motivation, decreased social supports,  APS report.  Patient will discharge back to boarding house with med management and therapy and support group follow up.   Reason for Continuation of Hospitalization: Anxiety Depression Medication stabilization Suicidal ideation  Estimated Length of Stay: 3-5 days   Scribe for Treatment Team: Zachery Conch, LCSW 06/17/2021 3:14 PM

## 2021-06-17 NOTE — Plan of Care (Signed)
?  Problem: Activity: ?Goal: Sleeping patterns will improve ?Outcome: Progressing ?  ?Problem: Health Behavior/Discharge Planning: ?Goal: Compliance with treatment plan for underlying cause of condition will improve ?Outcome: Progressing ?  ?Problem: Education: ?Goal: Emotional status will improve ?Outcome: Not Progressing ?Goal: Mental status will improve ?Outcome: Not Progressing ?  ?

## 2021-06-17 NOTE — Group Note (Signed)
Date:  06/17/2021 ?Time:  11:32 AM ? ?Group Topic/Focus:  ?Dimensions of Wellness:   The focus of this group is to introduce the topic of wellness and discuss the role each dimension of wellness plays in total health. ? ? ? ?Participation Level:  Active ? ?Participation Quality:  Appropriate ? ?Affect:  Appropriate ? ?Cognitive:  Appropriate ? ?Insight: Appropriate ? ?Engagement in Group:  Engaged ? ?Modes of Intervention:  Discussion ? ?Additional Comments:  Understood wellness as a part of engaging in his environment. ? ?Jerrye Beavers ?06/17/2021, 11:32 AM ? ?

## 2021-06-17 NOTE — Progress Notes (Signed)
[facility] MD Progress Note  06/17/2021 5:54 PM Casey Silva  MRN:  [id number]  Subjective: Casey Silva states, "I feel fairly bad because of my anxiety and thinking about the future."  Today's assessment (06/17/2021): Pt's chart reviewed, findings shared with the treatment Team and discussed with Dr. Berdine Addison. Pt continues to present with a blunted affect and depressed mood, attention to personal hygiene and grooming is fair, eye contact is good, speech is clear & coherent. Pt continues to ambulate with a shuffling gait, but no tremors visible. Thought contents are organized and logical. Pt denied feeling suicidal ideation, homicidal ideation, auditory or visual hallucinations or paranoia/delusional thoughts.    He reports a fair sleep quality last night for at least 5 hours.  Pt rated his anxiety as "8" and depression as "7" on a scale of 0 to 10. He reported good appetite and consuming 100% of his meals.  Pt continues to report being worried about being in the hospital and his rent not being paid. Social worker made aware. Pt able to provide the name of his landlord, but denied having a phone number for him, and denied having a phone in the lockers which can be retrieved to get the number from.   Patient's treatment team discussed the possibility of getting pt referred into adult day care programs such as [facility] since his loneliness is a stressor. However, pt has no health insurance coverage at this time to pursue this option.    Na on admission was 130, sodium from 06/16/21 is 135-WNL,reviewed. LDL-123, EKG from 2/28 with prolonged QTC at 504, repeated EKG-NSR and QTC of 463. Effexor was increased  to 75 mg daily effective (3/7) for management of depressive symptoms. Na level rechecked today (3/9) to make sure that it is continuing to trend up with the increase in Effexor. It is currently WNL at 135. Will continue current medications.   Reason for Admission: As per HPI from pt's initial presentation to the [facility]:  "Casey Silva is a 66 y.o. male with history of anxiety, depression, and bipolar 1 disorder who presents to the emergency department with complaints of anxiety, depression, and suicidal ideation.  Patient states he ran out of his psychiatric medicines 3 days ago which is led to a significant decline in his mental health.  He states he feels very anxious.  He states he just does not want to deal with everything anymore."   Principal Problem: MDD (major depressive disorder), recurrent, severe, with psychosis ([place]) Diagnosis: Principal Problem:   MDD (major depressive disorder), recurrent, severe, with psychosis ([place]) Active Problems:   GAD (generalized anxiety disorder)   Tobacco user   Vitamin D deficiency  Total Time spent with patient: 30 minutes  Past Psychiatric History: See H & P  Past Medical History:  Past Medical History:  Diagnosis Date   Anxiety    Asthma    Depression    History reviewed. No pertinent surgical history. Family History:  Family History  Problem Relation Age of Onset   Bipolar disorder Mother    Family Psychiatric  History: See H & P Social History:  Social History   Substance and Sexual Activity  Alcohol Use No     Social History   Substance and Sexual Activity  Drug Use No    Social History   Socioeconomic History   Marital status: Single    Spouse name: Not on file   Number of children: 1   Years of education: Not on file  Highest education level: GED or equivalent  Occupational History   Not on file  Tobacco Use   Smoking status: Never   Smokeless tobacco: Never  Vaping Use   Vaping Use: Unknown  Substance and Sexual Activity   Alcohol use: No   Drug use: No   Sexual activity: Not Currently  Other Topics Concern   Not on file  Social History Narrative   Marital status:  Single   Children: Daughter   Employment:  Unemployment. previous work for [employer].   Alcohol:  None   Drugs:  none   Exercise:  Regularly very    Religion:  Jewish   Social Determinants of Radio broadcast assistant Strain: Not on file  Food Insecurity: Not on file  Transportation Needs: Not on file  Physical Activity: Not on file  Stress: Not on file  Social Connections: Not on file   Additional Social History:    Sleep: Fair  Appetite:  Good  Current Medications: Current Facility-Administered Medications  Medication Dose Route Frequency Provider Last Rate Last Admin   acetaminophen (TYLENOL) tablet 650 mg  650 mg Oral Q6H PRN Suella Broad, FNP   650 mg at 06/11/21 1430   alum & mag hydroxide-simeth (MAALOX/MYLANTA) 200-200-20 MG/5ML suspension 30 mL  30 mL Oral Q4H PRN Starkes-Perry, Gayland Curry, FNP       amLODipine (NORVASC) tablet 10 mg  10 mg Oral Daily Nicholes Rough, NP   10 mg at 06/17/21 0746   diclofenac Sodium (VOLTAREN) 1 % topical gel 2 g  2 g Topical QID Damita Dunnings B, MD   2 g at 06/12/21 1314   feeding supplement (ENSURE ENLIVE / ENSURE PLUS) liquid 237 mL  237 mL Oral BID BM Nkwenti, Doris, NP   237 mL at 06/17/21 1505   gabapentin (NEURONTIN) capsule 100 mg  100 mg Oral TID Nicholes Rough, NP   100 mg at 06/17/21 1621   influenza vac split quadrivalent PF (FLUARIX) injection 0.5 mL  0.5 mL Intramuscular Tomorrow-1000 Nkwenti, Doris, NP       magnesium hydroxide (MILK OF MAGNESIA) suspension 30 mL  30 mL Oral Daily PRN Suella Broad, FNP   30 mL at 06/09/21 1611   melatonin tablet 5 mg  5 mg Oral QHS Suella Broad, FNP   5 mg at 06/16/21 2127   mirtazapine (REMERON) tablet 30 mg  30 mg Oral QHS Suella Broad, FNP   30 mg at 06/16/21 2127   multivitamin with minerals tablet 1 tablet  1 tablet Oral Daily Harlow Asa, MD   1 tablet at 06/17/21 0746   OLANZapine (ZYPREXA) tablet 2.5 mg  2.5 mg Oral Daily Nkwenti, Tamela Oddi, NP   2.5 mg at 06/17/21 0746   OLANZapine (ZYPREXA) tablet 5 mg  5 mg Oral QHS Nkwenti, Doris, NP   5 mg at 06/16/21 2126   propranolol (INDERAL) tablet 10  mg  10 mg Oral BID Viann Fish E, MD   10 mg at 06/17/21 1621   traZODone (DESYREL) tablet 100 mg  100 mg Oral QHS Nkwenti, Doris, NP   100 mg at 06/16/21 2127   venlafaxine XR (EFFEXOR-XR) 24 hr capsule 75 mg  75 mg Oral Q breakfast Nicholes Rough, NP   75 mg at 06/17/21 0746   Vitamin D (Ergocalciferol) (DRISDOL) capsule 50,000 Units  50,000 Units Oral Q7 days Harlow Asa, MD   50,000 Units at 06/11/21 1926    Lab Results:  Results for orders placed or performed during the hospital encounter of 06/07/21 (from the past 48 hour(s))  Comprehensive metabolic panel     Status: None   Collection Time: 06/16/21  6:30 AM  Result Value Ref Range   Sodium 135 135 - 145 mmol/L   Potassium 3.6 3.5 - 5.1 mmol/L   Chloride 102 98 - 111 mmol/L   CO2 24 22 - 32 mmol/L   Glucose, Bld 84 70 - 99 mg/dL    Comment: Glucose reference range applies only to samples taken after fasting for at least 8 hours.   BUN 16 8 - 23 mg/dL   Creatinine, Ser 0.86 0.61 - 1.24 mg/dL   Calcium 8.9 8.9 - 10.3 mg/dL   Total Protein 6.5 6.5 - 8.1 g/dL   Albumin 3.7 3.5 - 5.0 g/dL   AST 18 15 - 41 U/L   ALT 20 0 - 44 U/L   Alkaline Phosphatase 53 38 - 126 U/L   Total Bilirubin 0.3 0.3 - 1.2 mg/dL   GFR, Estimated >60 >60 mL/min    Comment: (NOTE) Calculated using the CKD-EPI Creatinine Equation (2021)    Anion gap 9 5 - 15    Comment: Performed at [facility], [place] [address] [place], [place] [address]    Blood Alcohol level:  Lab Results  Component Value Date   ETH <10 06/07/2021   ETH <10 96/29/5284    Metabolic Disorder Labs: Lab Results  Component Value Date   HGBA1C 5.1 06/07/2021   MPG 99.67 06/07/2021   MPG 111.15 11/24/2020   No results found for: PROLACTIN Lab Results  Component Value Date   CHOL 197 06/07/2021   TRIG 149 06/07/2021   HDL 44 06/07/2021   CHOLHDL 4.5 06/07/2021   VLDL 30 06/07/2021   LDLCALC 123 (H) 06/07/2021   LDLCALC 148 (H) 11/24/2020     Physical Findings: AIMS: Facial and Oral Movements Muscles of Facial Expression: None, normal Lips and Perioral Area: None, normal Jaw: None, normal Tongue: None, normal,Extremity Movements Upper (arms, wrists, hands, fingers): None, normal Lower (legs, knees, ankles, toes): None, normal, Trunk Movements Neck, shoulders, hips: None, normal, Overall Severity Severity of abnormal movements (highest score from questions above): None, normal Incapacitation due to abnormal movements: None, normal Patient's awareness of abnormal movements (rate only patient's report): No Awareness, Dental Status Current problems with teeth and/or dentures?: No Does patient usually wear dentures?: No  CIWA:    COWS:     Musculoskeletal: Strength & Muscle Tone: within normal limits Gait & Station: normal Patient leans: N/A  Psychiatric Specialty Exam:  Presentation  General Appearance: Appropriate for Environment; Fairly Groomed; Casual  Eye Contact:Fleeting  Speech:Clear and Coherent  Speech Volume:Normal  Handedness:Right  Mood and Affect  Mood:Anxious; Depressed  Affect:Depressed  Thought Process  Thought Processes:Linear  Descriptions of Associations:Intact  Orientation:Full (Time, Place and Person)  Thought Content:WDL  History of Schizophrenia/Schizoaffective disorder:No  Duration of Psychotic Symptoms:Greater than six months  Hallucinations:Hallucinations: None  Ideas of Reference:None  Suicidal Thoughts:Suicidal Thoughts: No SI Active Intent and/or Plan: Without Intent; Without Plan SI Passive Intent and/or Plan: Without Intent; Without Plan  Homicidal Thoughts:Homicidal Thoughts: No  Sensorium  Memory:Immediate Fair; Recent Fair; Remote Fair  Judgment:Fair  Insight:Fair  Executive Functions  Concentration:Fair  Attention Span:Fair  [place]  Psychomotor Activity  Psychomotor Activity:Psychomotor  Activity: Restlessness; Mannerisms; Shuffling Gait  Assets  Assets:Communication Skills; Physical Health  Sleep  Sleep:Sleep: Good Number  of Hours of Sleep: 5  Physical Exam: Physical Exam ROS Blood pressure 135/81, pulse 94, temperature 97.6 F (36.4 C), resp. rate 14, height 5' 6.54" (1.69 m), weight 71.8 kg, SpO2 99 %. Body mass index is 25.13 kg/m.  Treatment Plan Summary: Daily contact with patient to assess and evaluate symptoms and progress in treatment and Medication management  MDD recurrent severe with psychotic features GAD by hx   PLAN: Safety and Monitoring:             -- Voluntary admission to inpatient psychiatric unit for safety, stabilization and treatment             -- Daily contact with patient to assess and evaluate symptoms and progress in treatment             -- Patient's case to be discussed in multi-disciplinary team meeting             -- Observation Level : q15 minute checks             -- Vital signs:  q12 hours             -- Precautions: suicide, elopement, and assault   2. Psychiatric Diagnoses and Treatment:              MDD recurrent severe with psychotic features GAD by hx -Continue Zyprexa 2.5 mg in the mornings -Continue Zyprexa 5 mg nightly for psychosis (monitoring of QTC) - Continue Effexor XR to 75 mg and monitor Na+ closely, and repeat on 3/9 to make sure it is continuing to trend up. 06/17/21 Sodium level is 135. -Continue Remeron 30 mg nightly  -Discontinued Hydroxyzine 25 mg PRN as it might be contributing to restlessness in patient.  - Continue Inderal '10mg'$  BID, for anxiety -Continue Gabapentin 100 mg TID for anxiety - Prompting to attend to ADLs and to eat  - MOCA 28/30 - Continue room lock out for meals and groups             -- Metabolic profile and EKG monitoring obtained while on an atypical antipsychotic (Lipid Panel: WNL except for LDL 123; HbgA1c:5.1; QTc:467m, B12- WNL, Vit D- 14.94, Folate- WNL) Repeat EKG- 463  (3/5)             -- Encouraged patient to participate in unit milieu and in scheduled group therapies                Insomnia -Continue Trazodone 100 mg nightly (monitoring QTC) -Continue Melatonin 5 mg nightly  - Improved sleep hygiene encouraged                          3. Medical Issues Being Addressed:              Hyponatremia - Trending BMP for monitoring, Na+ 135 (3/9) - Hope will correct as po intake improves, thus far improving   Prolonged QTC - resolved - EKG shows QTC 4621m 463 on repeat                HTN             -- Continue Norvasc '10mg'$  daily               Low Vit D             -- Continue Vit D 50,000 weekly   Self-care deficits - PT and OT consults obtained for ambulation and assessment for  ability to function independently after discharge (PT helping with posture and gait and OT feels he has physical and cognitive abilities to function independently but has psychological challenges limiting his perfomance in IADLs. PT eval to be completed tomorrow (3/7). - Ensure tid and po intake encouraged - MVI ordered daily - SW placed APS referral, 3/5   Back Pain - Volatren gel PRN for back pain and encouraged to be OOB   4. Discharge Planning:              -- Social work and case management to assist with discharge planning and identification of hospital follow-up needs prior to discharge             -- Estimated LOS: 7-10 days             -- Discharge Concerns: Need to establish a safety plan; Medication compliance and effectiveness             -- Discharge Goals: Return home with outpatient referrals for mental health follow-up including medication management/psychotherapy    Laretta Bolster, [place] 06/17/2021, 5:54 PM

## 2021-06-17 NOTE — Progress Notes (Signed)
Pt denies HI/AVH but endorses SI and verbally agrees to approach staff if these become apparent or before harming themselves/others. Rates depression 8/10. Rates anxiety 7/10. Rates pain 0/10.  Pt has been anxious throughout the day but encouraged to use coping skills. Pt has been out of his room a decent amount today. Pt is on room lockout during meals and groups. Scheduled medications administered to pt, per MD orders. RN provided support and encouragement to pt. Q15 min safety checks implemented and continued. Pt safe on the unit. RN will continue to monitor and intervene as needed.  ? 06/17/21 0750  ?Psych Admission Type (Psych Patients Only)  ?Admission Status Voluntary  ?Psychosocial Assessment  ?Patient Complaints Depression;Anxiety;Nervousness;Sadness  ?Eye Contact Fair  ?Facial Expression Anxious  ?Affect Anxious;Sad  ?Speech Logical/coherent  ?Interaction Assertive;Minimal  ?Motor Activity Slow  ?Appearance/Hygiene In scrubs  ?Behavior Characteristics Cooperative;Appropriate to situation  ?Mood Depressed;Anxious  ?Thought Process  ?Coherency WDL  ?Content WDL  ?Delusions None reported or observed  ?Perception WDL  ?Hallucination None reported or observed  ?Judgment Impaired  ?Confusion None  ?Danger to Self  ?Current suicidal ideation? Passive  ?Self-Injurious Behavior No self-injurious ideation or behavior indicators observed or expressed   ?Agreement Not to Harm Self Yes  ?Description of Agreement verbal contract for safety  ?Danger to Others  ?Danger to Others None reported or observed  ? ? ?

## 2021-06-17 NOTE — Progress Notes (Signed)
? ? ?   06/16/21 2127  ?Psych Admission Type (Psych Patients Only)  ?Admission Status Voluntary  ?Psychosocial Assessment  ?Patient Complaints Depression;Anxiety  ?Eye Contact Fair  ?Facial Expression Anxious  ?Affect Appropriate to circumstance  ?Speech Logical/coherent  ?Interaction Isolative;Minimal  ?Motor Activity Slow  ?Appearance/Hygiene In scrubs  ?Behavior Characteristics Cooperative;Appropriate to situation  ?Mood Depressed;Anxious  ?Thought Process  ?Coherency WDL  ?Content WDL  ?Delusions None reported or observed  ?Perception WDL  ?Hallucination None reported or observed  ?Judgment Impaired  ?Confusion None  ?Danger to Self  ?Current suicidal ideation? Denies  ?Self-Injurious Behavior No self-injurious ideation or behavior indicators observed or expressed   ?Agreement Not to Harm Self Yes  ?Description of Agreement verbal contract for safety  ?Danger to Others  ?Danger to Others None reported or observed  ? ? ?

## 2021-06-18 MED ORDER — VENLAFAXINE HCL ER 75 MG PO CP24
112.5000 mg | ORAL_CAPSULE | Freq: Every day | ORAL | Status: DC
  Administered 2021-06-19 – 2021-06-23 (×5): 112.5 mg via ORAL
  Filled 2021-06-18 (×7): qty 1

## 2021-06-18 NOTE — Progress Notes (Addendum)
D: Patient is visible in the milieu. He attended two group this morning. He is currently on room lock out. He rates his depression, hopelessness and anxiety as a 5 today. He states he has thoughts of self harm; however, he contracts for safety on the unit. He denies any AVH. He plans to work on "discharge" today; however, does not have any plan in place. He states his sleep and appetite are good. ? ?A: Continue to monitor medication management and MD orders.  Safety checks completed every 15 minutes per protocol.  Offer support and encouragement as needed. ? ?R: Patient is receptive to staff; his behavior is appropriate.   ? ? 06/18/21 0900  ?Psych Admission Type (Psych Patients Only)  ?Admission Status Voluntary  ?Psychosocial Assessment  ?Patient Complaints Depression;Anxiety  ?Eye Contact Fair  ?Facial Expression Anxious  ?Affect Anxious;Depressed  ?Speech Soft;Logical/coherent  ?Interaction Minimal  ?Motor Activity Slow  ?Appearance/Hygiene Poor hygiene  ?Behavior Characteristics Cooperative  ?Mood Anxious  ?Thought Process  ?Coherency WDL  ?Content WDL  ?Delusions None reported or observed  ?Perception WDL  ?Hallucination None reported or observed  ?Judgment Impaired  ?Confusion None  ?Danger to Self  ?Current suicidal ideation? Denies  ?Self-Injurious Behavior No self-injurious ideation or behavior indicators observed or expressed   ?Agreement Not to Harm Self Yes  ?Description of Agreement verbal  ?Danger to Others  ?Danger to Others None reported or observed  ? ? ?

## 2021-06-18 NOTE — Progress Notes (Addendum)
[facility] MD Progress Note  06/18/2021 7:05 PM Casey Silva  MRN:  [id number]  Subjective: Casey Silva states, "I am better today with regards to increased getting up and sitting down."  Today's assessment (06/18/2021): Pt's chart reviewed, findings shared with the treatment Team and discussed with Dr. Berdine Addison. Pt continues to present with a blunted affect and depressed mood, attention to personal hygiene and grooming is fair, eye contact is good, speech is clear & coherent. Pt continues to ambulate with a shuffling gait, but no tremors visible. Thought contents are organized and logical. Pt denied feeling suicidal ideation, homicidal ideation, auditory or visual hallucinations or paranoia/delusional thoughts. More coherent and reduced restlessness observed.    He reports a fair sleep quality last night for at least 6 hours.  Pt rated his anxiety as "7" and depression as "7" on a scale of 0 to 10. He reported good appetite and consuming 100% of his meals.  Pt continues to report being worried about losing his room at the [facility] while here at the hospital. Social worker made aware. Pt able to provide the name of his landlord, but denied having a phone number for him, and denied having a phone in the lockers which can be retrieved to get the number from. Reported feeling decreased restlessness and anxiety today. His Venlafaxine XR was increased to 112.5 mg po daily with breakfast. Will continue to monitor for Sodium level as side effect of this medication.   Patient's treatment team discussed the possibility of getting pt referred into adult day care programs such as [facility] since his loneliness is a stressor. However, pt has no health insurance coverage at this time to pursue this option.    Na+level on admission was 130, sodium from 06/18/21 is 135-WNL,reviewed. LDL-123, EKG from 2/28 with prolonged QTC at 504, repeated EKG-NSR and QTC of 463. Effexor was increased  to 75 mg daily effective (3/7) for  management of depressive symptoms. Na level rechecked today (3/9) to make sure that it is continuing to trend up with the increase in Effexor. It is currently WNL at 135. Will continue current medications.   Reason for Admission: As per HPI from pt's initial presentation to the [facility]: "Casey Silva is a 66 y.o. male with history of anxiety, depression, and bipolar 1 disorder who presents to the emergency department with complaints of anxiety, depression, and suicidal ideation.  Patient states he ran out of his psychiatric medicines 3 days ago which is led to a significant decline in his mental health.  He states he feels very anxious.  He states he just does not want to deal with everything anymore."   Principal Problem: MDD (major depressive disorder), recurrent, severe, with psychosis ([place]) Diagnosis: Principal Problem:   MDD (major depressive disorder), recurrent, severe, with psychosis ([place]) Active Problems:   GAD (generalized anxiety disorder)   Tobacco user   Vitamin D deficiency  Total Time spent with patient: 30 minutes  Past Psychiatric History: See H & P  Past Medical History:  Past Medical History:  Diagnosis Date   Anxiety    Asthma    Depression    History reviewed. No pertinent surgical history. Family History:  Family History  Problem Relation Age of Onset   Bipolar disorder Mother    Family Psychiatric  History: See H & P Social History:  Social History   Substance and Sexual Activity  Alcohol Use No     Social History   Substance and Sexual Activity  Drug Use No    Social History   Socioeconomic History   Marital status: Single    Spouse name: Not on file   Number of children: 1   Years of education: Not on file   Highest education level: GED or equivalent  Occupational History   Not on file  Tobacco Use   Smoking status: Never   Smokeless tobacco: Never  Vaping Use   Vaping Use: Unknown  Substance and Sexual Activity   Alcohol use: No   Drug  use: No   Sexual activity: Not Currently  Other Topics Concern   Not on file  Social History Narrative   Marital status:  Single   Children: Daughter   Employment:  Unemployment. previous work for [employer].   Alcohol:  None   Drugs:  none   Exercise:  Regularly very   Religion:  Jewish   Social Determinants of Radio broadcast assistant Strain: Not on file  Food Insecurity: Not on file  Transportation Needs: Not on file  Physical Activity: Not on file  Stress: Not on file  Social Connections: Not on file   Additional Social History:    Sleep: Fair  Appetite:  Good  Current Medications: Current Facility-Administered Medications  Medication Dose Route Frequency Provider Last Rate Last Admin   acetaminophen (TYLENOL) tablet 650 mg  650 mg Oral Q6H PRN Suella Broad, FNP   650 mg at 06/11/21 1430   alum & mag hydroxide-simeth (MAALOX/MYLANTA) 200-200-20 MG/5ML suspension 30 mL  30 mL Oral Q4H PRN Starkes-Perry, Gayland Curry, FNP       amLODipine (NORVASC) tablet 10 mg  10 mg Oral Daily Nkwenti, Doris, NP   10 mg at 06/18/21 9326   diclofenac Sodium (VOLTAREN) 1 % topical gel 2 g  2 g Topical QID Damita Dunnings B, MD   2 g at 06/12/21 1314   feeding supplement (ENSURE ENLIVE / ENSURE PLUS) liquid 237 mL  237 mL Oral BID BM Nkwenti, Doris, NP   237 mL at 06/18/21 1443   gabapentin (NEURONTIN) capsule 200 mg  200 mg Oral TID Garrison Columbus C, FNP   200 mg at 06/18/21 1650   influenza vac split quadrivalent PF (FLUARIX) injection 0.5 mL  0.5 mL Intramuscular Tomorrow-1000 Nkwenti, Doris, NP       magnesium hydroxide (MILK OF MAGNESIA) suspension 30 mL  30 mL Oral Daily PRN Suella Broad, FNP   30 mL at 06/09/21 1611   melatonin tablet 5 mg  5 mg Oral QHS Suella Broad, FNP   5 mg at 06/17/21 2110   mirtazapine (REMERON) tablet 30 mg  30 mg Oral QHS Suella Broad, FNP   30 mg at 06/17/21 2110   multivitamin with minerals tablet 1 tablet  1 tablet Oral  Daily Viann Fish E, MD   1 tablet at 06/18/21 0815   OLANZapine (ZYPREXA) tablet 2.5 mg  2.5 mg Oral Daily Nkwenti, Tamela Oddi, NP   2.5 mg at 06/18/21 0816   OLANZapine (ZYPREXA) tablet 5 mg  5 mg Oral QHS Nkwenti, Doris, NP   5 mg at 06/17/21 2111   propranolol (INDERAL) tablet 10 mg  10 mg Oral BID Viann Fish E, MD   10 mg at 06/18/21 1650   traZODone (DESYREL) tablet 100 mg  100 mg Oral QHS Nkwenti, Doris, NP   100 mg at 06/17/21 2110   [START ON 06/19/2021] venlafaxine XR (EFFEXOR-XR) 24 hr capsule 112.5 mg  112.5  mg Oral Q breakfast Hill, Jackie Plum, MD       Vitamin D (Ergocalciferol) (DRISDOL) capsule 50,000 Units  50,000 Units Oral Q7 days Harlow Asa, MD   50,000 Units at 06/18/21 1650    Lab Results:  No results found for this or any previous visit (from the past 48 hour(s)).   Blood Alcohol level:  Lab Results  Component Value Date   ETH <10 06/07/2021   ETH <10 82/50/5397    Metabolic Disorder Labs: Lab Results  Component Value Date   HGBA1C 5.1 06/07/2021   MPG 99.67 06/07/2021   MPG 111.15 11/24/2020   No results found for: PROLACTIN Lab Results  Component Value Date   CHOL 197 06/07/2021   TRIG 149 06/07/2021   HDL 44 06/07/2021   CHOLHDL 4.5 06/07/2021   VLDL 30 06/07/2021   LDLCALC 123 (H) 06/07/2021   LDLCALC 148 (H) 11/24/2020    Physical Findings: AIMS: Facial and Oral Movements Muscles of Facial Expression: None, normal Lips and Perioral Area: None, normal Jaw: None, normal Tongue: None, normal,Extremity Movements Upper (arms, wrists, hands, fingers): None, normal Lower (legs, knees, ankles, toes): None, normal, Trunk Movements Neck, shoulders, hips: None, normal, Overall Severity Severity of abnormal movements (highest score from questions above): None, normal Incapacitation due to abnormal movements: None, normal Patient's awareness of abnormal movements (rate only patient's report): No Awareness, Dental Status Current problems  with teeth and/or dentures?: No Does patient usually wear dentures?: No  CIWA:    COWS:     Musculoskeletal: Strength & Muscle Tone: within normal limits Gait & Station: normal Patient leans: N/A  Psychiatric Specialty Exam:  Presentation  General Appearance: Casual; Appropriate for Environment; Fairly Groomed  Eye Contact:Fair  Speech:Clear and Coherent; Normal Rate  Speech Volume:Normal  Handedness:Right  Mood and Affect  Mood:Anxious; Depressed  Affect:Depressed  Thought Process  Thought Processes:Linear  Descriptions of Associations:Intact  Orientation:Full (Time, Place and Person)  Thought Content:Logical; WDL  History of Schizophrenia/Schizoaffective disorder:No  Duration of Psychotic Symptoms:N/A  Hallucinations:Hallucinations: None  Ideas of Reference:None  Suicidal Thoughts:Suicidal Thoughts: No  Homicidal Thoughts:Homicidal Thoughts: No  Sensorium  Memory:Immediate Fair; Recent Fair; Remote Fair  Judgment:Fair  Insight:Fair  Executive Functions  Concentration:Fair  Attention Span:Fair  [place]  Psychomotor Activity  Psychomotor Activity:Psychomotor Activity: Restlessness  Assets  Assets:Communication Skills; Physical Health  Sleep  Sleep:Sleep: Fair Number of Hours of Sleep: 6  Physical Exam: Physical Exam Vitals and nursing note reviewed.  Constitutional:      Appearance: Normal appearance. He is normal weight.  HENT:     Head: Normocephalic and atraumatic.     Right Ear: External ear normal.     Left Ear: External ear normal.     Nose: Nose normal.     Mouth/Throat:     Mouth: Mucous membranes are moist.  Eyes:     Extraocular Movements: Extraocular movements intact.     Conjunctiva/sclera: Conjunctivae normal.     Pupils: Pupils are equal, round, and reactive to light.  Cardiovascular:     Rate and Rhythm: Normal rate.     Pulses: Normal pulses.  Pulmonary:      Effort: Pulmonary effort is normal.  Abdominal:     Palpations: Abdomen is soft.  Genitourinary:    Comments: Deferred Musculoskeletal:        General: Normal range of motion.     Cervical back: Normal range of motion and neck supple.  Skin:  General: Skin is warm.  Neurological:     General: No focal deficit present.     Mental Status: He is alert and oriented to person, place, and time.   Review of Systems  Constitutional: Negative.  Negative for chills and fever.  HENT: Negative.  Negative for hearing loss and tinnitus.   Eyes:  Negative for blurred vision and double vision.  Respiratory:  Negative for cough, sputum production, shortness of breath and wheezing.   Cardiovascular: Negative.  Negative for chest pain.  Gastrointestinal: Negative.  Negative for abdominal pain, constipation, diarrhea, heartburn, nausea and vomiting.  Genitourinary: Negative.   Musculoskeletal: Negative.   Skin:  Negative for itching and rash.  Neurological:  Negative for dizziness, tingling, tremors, sensory change, speech change, focal weakness, seizures, loss of consciousness, weakness and headaches.  Endo/Heme/Allergies: Negative.        Dust Mite Mixed Allergen Ext [Mite (D. Farinae)] Dust Mite Mixed Allergen Ext [Mite (D. Farinae)]  Shortness Of Breath High Allergy 09/23/2019 Deletion Reason:   Bee Pollen Bee Pollen  Other (See Comments) Low Allergy 09/23/2019 Watery Eyes    Psychiatric/Behavioral:  Positive for depression. The patient is nervous/anxious.   Blood pressure 123/85, pulse 80, temperature 98.8 F (37.1 C), temperature source Oral, resp. rate 14, height 5' 6.54" (1.69 m), weight 71.8 kg, SpO2 99 %. Body mass index is 25.13 kg/m.  Treatment Plan Summary: Daily contact with patient to assess and evaluate symptoms and progress in treatment and Medication management  MDD recurrent severe with psychotic features GAD by hx   PLAN: Safety and Monitoring:             -- Voluntary  admission to inpatient psychiatric unit for safety, stabilization and treatment             -- Daily contact with patient to assess and evaluate symptoms and progress in treatment             -- Patient's case to be discussed in multi-disciplinary team meeting             -- Observation Level : q15 minute checks             -- Vital signs:  q12 hours             -- Precautions: suicide, elopement, and assault   2. Psychiatric Diagnoses and Treatment:              MDD recurrent severe with psychotic features GAD by hx -Continue Zyprexa 2.5 mg in the mornings -Continue Zyprexa 5 mg nightly for psychosis (monitoring of QTC) - Continue Effexor XR to 75 mg and monitor Na+ closely, and repeat on 3/9 to make sure it is continuing to trend up. 06/17/21 Sodium level is 135. 06/18/21 -Change Venlafaxine XR (Effexor-XR) 24 hour capsule  from 75 mg to 112.5 mg po daily with breakfast starting Sunday, 06/19/21 and monitor Na+ closely, to make sure it is continuing to trend up. Current Na+ level is 135. -Continue Remeron 30 mg nightly  -Discontinued Hydroxyzine 25 mg PRN as it might be contributing to restlessness in patient.  - Continue Inderal '10mg'$  BID, for anxiety -Continue Gabapentin 100 mg TID for anxiety - Prompting to attend to ADLs and to eat  - MOCA 28/30 - Continue room lock out for meals and groups             -- Metabolic profile and EKG monitoring obtained while on an atypical antipsychotic (Lipid Panel: WNL except  for LDL 123; HbgA1c:5.1; QTc:430m, B12- WNL, Vit D- 14.94, Folate- WNL) Repeat EKG- 463 (3/5)             -- Encouraged patient to participate in unit milieu and in scheduled group therapies                Insomnia -Continue Trazodone 100 mg nightly (monitoring QTC) -Continue Melatonin 5 mg nightly  - Improved sleep hygiene encouraged                          3. Medical Issues Being Addressed:              Hyponatremia - Trending BMP for monitoring, Na+ 135 (3/9) - Hope will  correct as po intake improves, thus far improving   Prolonged QTC - resolved - EKG shows QTC 4634m 463 on repeat                HTN             -- Continue Norvasc '10mg'$  daily               Low Vit D             -- Continue Vit D 50,000 weekly   Self-care deficits - PT and OT consults obtained for ambulation and assessment for ability to function independently after discharge (PT helping with posture and gait and OT feels he has physical and cognitive abilities to function independently but has psychological challenges limiting his perfomance in IADLs. PT eval to be completed tomorrow (3/7). - Ensure tid and po intake encouraged - MVI ordered daily - SW placed APS referral, 3/5   Back Pain - Volatren gel PRN for back pain and encouraged to be OOB   4. Discharge Planning:              -- Social work and case management to assist with discharge planning and identification of hospital follow-up needs prior to discharge             -- Estimated LOS: 7-10 days             -- Discharge Concerns: Need to establish a safety plan; Medication compliance and effectiveness             -- Discharge Goals: Return home with outpatient referrals for mental health follow-up including medication management/psychotherapy    TiLaretta BolsterFNHebron/02/2022, 7:05 PM Patient ID: Casey Silva   DOB: [id number].   MRN: [id number]

## 2021-06-18 NOTE — Progress Notes (Signed)
?   06/18/21 2015  ?Psych Admission Type (Psych Patients Only)  ?Admission Status Voluntary  ?Psychosocial Assessment  ?Patient Complaints Anxiety;Depression  ?Eye Contact Glaring  ?Facial Expression Anxious  ?Affect Anxious;Depressed  ?Speech Soft;Logical/coherent  ?Interaction Minimal  ?Motor Activity Slow  ?Appearance/Hygiene Disheveled;Poor hygiene;In scrubs  ?Behavior Characteristics Cooperative;Anxious  ?Mood Depressed;Anxious  ?Thought Process  ?Coherency WDL  ?Content WDL  ?Delusions None reported or observed  ?Perception WDL  ?Judgment Impaired  ?Confusion None  ?Danger to Self  ?Current suicidal ideation? Denies  ?Danger to Others  ?Danger to Others None reported or observed  ? ?Pt seen in dayroom. Pt denies SI, HI, AVH and pain. Pt rates anxiety and depression both 7/10. Pt still disheveled. Denies any other complaints at this time.  ?

## 2021-06-18 NOTE — [facility] Group Notes (Signed)
Psychoeducational Group Note ? ? ? ?Date:06/18/21 ?Time: 1300-1400 ? ? ? ?Purpose of Group: . The group focus' on teaching patients on how to identify their needs and their Life Skills:  A group where two lists are made. What people need and what are things that we do that are unhealthy. The lists are developed by the patients and it is explained that we often do the actions that are not healthy to get our list of needs met. ? ?Goal:: to develop the coping skills needed to get their needs met ? ?Participation Level:  did not attend ?[name] ? ?

## 2021-06-18 NOTE — Progress Notes (Signed)
The patient rated his day as a 7 out of 10 but did not go into further detail. His goal for tomorrow is to work on his discharge plans.  ?

## 2021-06-18 NOTE — [facility] Group Notes (Signed)
.  Psychoeducational Group Note ? ?Date: 06/18/2021 ?Time: 0900-1000 ? ? ? ?Goal Setting  ? ?Purpose of Group: This group helps to provide patients with the steps of setting Silva goal that is specific, measurable, attainable, realistic and time specific. Silva discussion on how we keep ourselves stuck with negative self talk. Homework given for Patients to write 30 positive attributes about themselves. ? ? ? ?Participation Level:  Active ? ?Participation Quality:  Appropriate ? ?Affect:  Appropriate ? ?Cognitive:  Appropriate ? ?Insight:  Improving ? ?Engagement in Group:  Engaged ? ?Additional Comments:  Pt attended the group. States he feels better since last week. States that he wants to live and is working on that. Rates his energy at Silva 5/10. Will stand up frequently in the group due to his anxiety. ? ?Casey Silva ?

## 2021-06-18 NOTE — Group Note (Signed)
LCSW Group Therapy Note ? ?Group Date: 06/18/2021 ?Start Time: 1000 ?End Time: 1100 ? ? ?Type of Therapy and Topic:  Group Therapy: Anger Cues and Responses ? ?Participation Level:  Active ? ? ?Description of Group:   ?In this group, patients learned how to recognize the physical, cognitive, emotional, and behavioral responses they have to anger-provoking situations.  They identified a recent time they became angry and how they reacted.  They analyzed how their reaction was possibly beneficial and how it was possibly unhelpful.  The group discussed a variety of healthier coping skills that could help with such a situation in the future.  Focus was placed on how helpful it is to recognize the underlying emotions to our anger, because working on those can lead to a more permanent solution as well as our ability to focus on the important rather than the urgent. ? ?Therapeutic Goals: ?Patients will remember their last incident of anger and how they felt emotionally and physically, what their thoughts were at the time, and how they behaved. ?Patients will identify how their behavior at that time worked for them, as well as how it worked against them. ?Patients will explore possible new behaviors to use in future anger situations. ?Patients will learn that anger itself is normal and cannot be eliminated, and that healthier reactions can assist with resolving conflict rather than worsening situations. ? ?Summary of Patient Progress:  Casey Silva was active during the group although he had considerable psychomotor agitation throughout the group. He shared a recent occurrence wherein feeling that his landlord was not taking care of a repair he should take care of led to anger.  He stated he "kept at him" until it was fixed.  He demonstrated goodn insight into the subject matter, was respectful of peers, and participated throughout the entire session. ? ?Therapeutic Modalities:   ?Cognitive Behavioral Therapy ? ? ? ?Maretta Los, LCSWA ?06/18/2021  4:32 PM   ? ?

## 2021-06-19 MED ORDER — HYDROXYZINE HCL 50 MG PO TABS
50.0000 mg | ORAL_TABLET | Freq: Three times a day (TID) | ORAL | Status: DC | PRN
  Administered 2021-06-19: 50 mg via ORAL
  Filled 2021-06-19: qty 1

## 2021-06-19 MED ORDER — GABAPENTIN 300 MG PO CAPS
300.0000 mg | ORAL_CAPSULE | Freq: Three times a day (TID) | ORAL | Status: DC
  Administered 2021-06-20 – 2021-06-22 (×8): 300 mg via ORAL
  Filled 2021-06-19 (×12): qty 1

## 2021-06-19 NOTE — [facility] Group Notes (Signed)
Adult Psychoeducational Group Not ?Date:  06/19/2021 ?Time:  5625-6389 ?Group Topic/Focus: PROGRESSIVE RELAXATION. A group where deep breathing is taught and tensing and relaxation muscle groups is used. Imagery is used as well.  Pts are asked to imagine 3 pillars that hold them up when they are not able to hold themselves up and to share that with the group. ? ?Participation Level:  Active ? ?Participation Quality:  Appropriate ? ?Affect:  Appropriate ? ?Cognitive:  Oriented ? ?Insight: Improving ? ?Engagement in Group:  Engaged ? ?Modes of Intervention:  Activity, Discussion, Education, and Support ? ?Additional Comments:  rates his energy at a 4. States his medication the staff and himself hold him up. ? ?[name] ? ?

## 2021-06-19 NOTE — Progress Notes (Signed)
[facility] MD Progress Note  06/19/2021 5:22 PM Casey Silva  MRN:  [id number] Subjective:  " I am a little better because I'm calmer."  Brief History: As per HPI from pt's initial presentation to the [facility]: "Casey Silva is a 66 y.o. male with history of anxiety, depression, and bipolar 1 disorder who presents to the emergency department with complaints of anxiety, depression, and suicidal ideation.  Patient states he ran out of his psychiatric medicines 3 days ago which is led to a significant decline in his mental health.  He states he feels very anxious.  He states he just does not want to deal with everything anymore."  Today's assessment (06/19/2021): Patient assessed in the office sitting up in a chair. Chart reviewed, findings shared with the treatment team and discussed with Dr. Berdine Addison. Pt continues to present with a blunted and depressed affect. Mood is anxious, depressed, worthless, and hopeless. Attention to personal hygiene and grooming is fair, eye contact is good, speech is clear & coherent. Pt continues to ambulate with a shuffling gait, but no tremors visible. Thought contents are organized and logical. Pt denied feeling suicidal ideation, homicidal ideation, auditory or visual hallucinations or paranoia/delusional thoughts. More coherent and reduced restlessness observed.    He reports a fair sleep quality last night for at least 6 hours.  Pt rated his anxiety as "7" and depression as "7" on a scale of 0 to 10. He reported good appetite and consuming 100% of his meals.  Pt continues to report being worried about losing his room at the [facility] while here at the hospital. Social worker made aware. Reported feeling decreased restlessness and anxiety today. His Venlafaxine XR was increased to 112.5 mg po daily with breakfast for management of depressive symptoms on 06/18/21. Will continue to monitor for Sodium level as side effect of this medication. Gabapentin was increased from 200 mg po TID to 300  mg PO TID for increasing anxiety.  Will continue current medications and monitor side effects.  LABS:Na+level on admission was 130, sodium from 06/19/21 is 135-WNL, reviewed. Effexor was increased  to 75 mg daily effective (3/7) for management of depressive symptoms. And on 06/18/21 Effexor XR was increased to 112.5  for management of depressive symptoms. LDL-123, EKG from 2/28 with prolonged QTC at 504, repeated EKG-NSR and QTC of 463  Nursing Staff: Reported that patient was very anxious this evening. Hydroxyzine 50 mg po order as one time with some relief. Hydroxyzine was discontinued as this makes him more restless as an after effect.  His Gabapentin was increased to 300 mg po TID for Anxiety.  Patient's treatment team discussed the possibility of getting pt referred into adult day care programs such as [facility] since his loneliness is a stressor. However, pt has no health insurance coverage at this time to pursue this option.    Principal Problem: MDD (major depressive disorder), recurrent, severe, with psychosis ([place])  Diagnosis: Principal Problem:   MDD (major depressive disorder), recurrent, severe, with psychosis ([place]) Active Problems:   GAD (generalized anxiety disorder)   Tobacco user   Vitamin D deficiency  Total Time spent with patient: 20 minutes  Past Psychiatric History:  MDD & anxiety  Past Medical History:  Past Medical History:  Diagnosis Date   Anxiety    Asthma    Depression    History reviewed. No pertinent surgical history. Family History:  Family History  Problem Relation Age of Onset   Bipolar disorder Mother  Family Psychiatric  History:   Bipolar disorder Mother      Social History:  Social History   Substance and Sexual Activity  Alcohol Use No     Social History   Substance and Sexual Activity  Drug Use No    Social History   Socioeconomic History   Marital status: Single    Spouse name: Not on file   Number of children: 1   Years  of education: Not on file   Highest education level: GED or equivalent  Occupational History   Not on file  Tobacco Use   Smoking status: Never   Smokeless tobacco: Never  Vaping Use   Vaping Use: Unknown  Substance and Sexual Activity   Alcohol use: No   Drug use: No   Sexual activity: Not Currently  Other Topics Concern   Not on file  Social History Narrative   Marital status:  Single   Children: Daughter   Employment:  Unemployment. previous work for [employer].   Alcohol:  None   Drugs:  none   Exercise:  Regularly very   Religion:  Jewish   Social Determinants of Radio broadcast assistant Strain: Not on file  Food Insecurity: Not on file  Transportation Needs: Not on file  Physical Activity: Not on file  Stress: Not on file  Social Connections: Not on file   Additional Social History:    Sleep: Good  Appetite:  Good  Current Medications: Current Facility-Administered Medications  Medication Dose Route Frequency Provider Last Rate Last Admin   acetaminophen (TYLENOL) tablet 650 mg  650 mg Oral Q6H PRN Suella Broad, FNP   650 mg at 06/11/21 1430   alum & mag hydroxide-simeth (MAALOX/MYLANTA) 200-200-20 MG/5ML suspension 30 mL  30 mL Oral Q4H PRN Starkes-Perry, Gayland Curry, FNP       amLODipine (NORVASC) tablet 10 mg  10 mg Oral Daily Nkwenti, Doris, NP   10 mg at 06/19/21 9147   diclofenac Sodium (VOLTAREN) 1 % topical gel 2 g  2 g Topical QID Damita Dunnings B, MD   2 g at 06/12/21 1314   feeding supplement (ENSURE ENLIVE / ENSURE PLUS) liquid 237 mL  237 mL Oral BID BM Nkwenti, Doris, NP   237 mL at 06/19/21 1426   gabapentin (NEURONTIN) capsule 200 mg  200 mg Oral TID Garrison Columbus C, FNP   200 mg at 06/19/21 1700   hydrOXYzine (ATARAX) tablet 50 mg  50 mg Oral TID PRN Laretta Bolster, FNP   50 mg at 06/19/21 1552   influenza vac split quadrivalent PF (FLUARIX) injection 0.5 mL  0.5 mL Intramuscular Tomorrow-1000 Nkwenti, Doris, NP       magnesium hydroxide  (MILK OF MAGNESIA) suspension 30 mL  30 mL Oral Daily PRN Suella Broad, FNP   30 mL at 06/09/21 1611   melatonin tablet 5 mg  5 mg Oral QHS Suella Broad, FNP   5 mg at 06/18/21 2121   mirtazapine (REMERON) tablet 30 mg  30 mg Oral QHS Suella Broad, FNP   30 mg at 06/18/21 2121   multivitamin with minerals tablet 1 tablet  1 tablet Oral Daily Harlow Asa, MD   1 tablet at 06/19/21 0735   OLANZapine (ZYPREXA) tablet 2.5 mg  2.5 mg Oral Daily Nicholes Rough, NP   2.5 mg at 06/19/21 0745   OLANZapine (ZYPREXA) tablet 5 mg  5 mg Oral QHS Nicholes Rough, NP  5 mg at 06/18/21 2121   propranolol (INDERAL) tablet 10 mg  10 mg Oral BID Viann Fish E, MD   10 mg at 06/19/21 1700   traZODone (DESYREL) tablet 100 mg  100 mg Oral QHS Nkwenti, Doris, NP   100 mg at 06/18/21 2121   venlafaxine XR (EFFEXOR-XR) 24 hr capsule 112.5 mg  112.5 mg Oral Q breakfast Hill, Jackie Plum, MD   112.5 mg at 06/19/21 6568   Vitamin D (Ergocalciferol) (DRISDOL) capsule 50,000 Units  50,000 Units Oral Q7 days Harlow Asa, MD   50,000 Units at 06/18/21 1650    Lab Results: No results found for this or any previous visit (from the past 48 hour(s)).  Blood Alcohol level:  Lab Results  Component Value Date   ETH <10 06/07/2021   ETH <10 12/75/1700    Metabolic Disorder Labs: Lab Results  Component Value Date   HGBA1C 5.1 06/07/2021   MPG 99.67 06/07/2021   MPG 111.15 11/24/2020   No results found for: PROLACTIN Lab Results  Component Value Date   CHOL 197 06/07/2021   TRIG 149 06/07/2021   HDL 44 06/07/2021   CHOLHDL 4.5 06/07/2021   VLDL 30 06/07/2021   LDLCALC 123 (H) 06/07/2021   LDLCALC 148 (H) 11/24/2020    Physical Findings: AIMS: Facial and Oral Movements Muscles of Facial Expression: None, normal Lips and Perioral Area: None, normal Jaw: None, normal Tongue: None, normal,Extremity Movements Upper (arms, wrists, hands, fingers): None, normal Lower  (legs, knees, ankles, toes): None, normal, Trunk Movements Neck, shoulders, hips: None, normal, Overall Severity Severity of abnormal movements (highest score from questions above): None, normal Incapacitation due to abnormal movements: None, normal Patient's awareness of abnormal movements (rate only patient's report): No Awareness, Dental Status Current problems with teeth and/or dentures?: No Does patient usually wear dentures?: No  CIWA:    COWS:     Musculoskeletal: Strength & Muscle Tone: within normal limits Gait & Station: normal Patient leans: N/A  Psychiatric Specialty Exam:  Presentation  General Appearance: Appropriate for Environment; Casual; Fairly Groomed  Eye Contact:Good  Speech:Clear and Coherent  Speech Volume:Normal  Handedness:Right  Mood and Affect  Mood:Anxious; Depressed; Hopeless; Worthless  Affect:Appropriate; Depressed  Thought Process  Thought Processes:Linear  Descriptions of Associations:Intact  Orientation:Full (Time, Place and Person)  Thought Content:Logical; WDL  History of Schizophrenia/Schizoaffective disorder:No  Duration of Psychotic Symptoms:N/A  Hallucinations:Hallucinations: None  Ideas of Reference:None  Suicidal Thoughts:Suicidal Thoughts: No  Homicidal Thoughts:Homicidal Thoughts: No  Sensorium  Memory:Immediate Fair; Recent Fair; Remote Fair  Judgment:Fair  Insight:Fair  Executive Functions  Concentration:Fair  Attention Span:Fair  [place]  Language:Good  Psychomotor Activity  Psychomotor Activity:Psychomotor Activity: Normal; Restlessness  Assets  Assets:Communication Skills; Physical Health  Sleep  Sleep:Sleep: Fair Number of Hours of Sleep: 5  Physical Exam: Physical Exam Constitutional:      Appearance: Normal appearance.  HENT:     Head: Normocephalic and atraumatic.     Right Ear: External ear normal.     Left Ear: External ear normal.     Nose: Nose  normal.     Mouth/Throat:     Mouth: Mucous membranes are moist.     Pharynx: Oropharynx is clear.  Eyes:     Extraocular Movements: Extraocular movements intact.     Conjunctiva/sclera: Conjunctivae normal.     Pupils: Pupils are equal, round, and reactive to light.  Cardiovascular:     Rate and Rhythm: Normal rate.  Pulses: Normal pulses.  Pulmonary:     Effort: Pulmonary effort is normal.  Abdominal:     Palpations: Abdomen is soft.  Genitourinary:    Comments: Deferred Musculoskeletal:        General: Normal range of motion.     Cervical back: Normal range of motion and neck supple.  Skin:    General: Skin is warm.  Neurological:     General: No focal deficit present.     Mental Status: He is alert and oriented to person, place, and time.   Review of Systems  Constitutional:  Negative for chills and fever.  HENT: Negative.  Negative for ear pain and hearing loss.   Eyes: Negative.  Negative for blurred vision, double vision, photophobia, pain, discharge and redness.  Respiratory: Negative.  Negative for cough, sputum production, shortness of breath and wheezing.   Cardiovascular: Negative.  Negative for chest pain and palpitations.  Gastrointestinal: Negative.  Negative for abdominal pain, blood in stool, constipation, diarrhea, heartburn, melena, nausea and vomiting.  Genitourinary: Negative.  Negative for dysuria, frequency and urgency.  Musculoskeletal: Negative.   Skin: Negative.  Negative for itching and rash.  Neurological:  Negative for dizziness, tingling, tremors, sensory change, speech change, focal weakness, seizures, loss of consciousness, weakness and headaches.  Endo/Heme/Allergies: Negative.        Dust Mite Mixed Allergen Ext [Mite (D. Farinae)] Dust Mite Mixed Allergen Ext [Mite (D. Farinae)]  Shortness Of Breath High Allergy 09/23/2019 Deletion Reason:   Bee Pollen Bee Pollen  Other (See Comments) Low Allergy 09/23/2019 Watery Eyes     Psychiatric/Behavioral:  Positive for depression. The patient is nervous/anxious.   Blood pressure 127/86, pulse 79, temperature 98 F (36.7 C), resp. rate 16, height 5' 6.54" (1.69 m), weight 71.8 kg, SpO2 99 %. Body mass index is 25.13 kg/m.   Treatment Plan Summary: Daily contact with patient to assess and evaluate symptoms and progress in treatment and Medication management  MDD recurrent severe with psychotic features GAD by hx   PLAN: Safety and Monitoring:             -- Voluntary admission to inpatient psychiatric unit for safety, stabilization and treatment             -- Daily contact with patient to assess and evaluate symptoms and progress in treatment             -- Patient's case to be discussed in multi-disciplinary team meeting             -- Observation Level : q15 minute checks             -- Vital signs:  q12 hours             -- Precautions: suicide, elopement, and assault   2. Psychiatric Diagnoses and Treatment:              MDD recurrent severe with psychotic features GAD by hx -Continue Zyprexa 2.5 mg in the mornings -Continue Zyprexa 5 mg nightly for psychosis (monitoring of QTC) - Continue Effexor XR to 75 mg and monitor Na+ closely, and repeat on 3/9 to make sure it is continuing to trend up. 06/17/21 Sodium level is 135. 06/18/21 -Change Venlafaxine XR (Effexor-XR) 24 hour capsule  from 75 mg to 112.5 mg po daily with breakfast starting Sunday, 06/19/21 and monitor Na+ closely, to make sure it is continuing to trend up. Current Na+ level is 135. -Continue Remeron 30 mg nightly  -  Discontinued Hydroxyzine 25 mg PRN as it might be contributing to restlessness in patient.  - Continue Inderal '10mg'$  BID, for anxiety -Continue Gabapentin 200 mg TID for anxiety 06/19/21: Increase Gabapentin from 200 mg po TID to 300 mg po TID for anxiety. First dose on 06/20/21 - Prompting to attend to ADLs and to eat  - MOCA 28/30 - Continue room lock out for meals and groups              -- Metabolic profile and EKG monitoring obtained while on an atypical antipsychotic (Lipid Panel: WNL except for LDL 123; HbgA1c:5.1; QTc:499m, B12- WNL, Vit D- 14.94, Folate- WNL) Repeat EKG- 463 (3/5)             -- Encouraged patient to participate in unit milieu and in scheduled group therapies                Insomnia -Continue Trazodone 100 mg nightly (monitoring QTC) -Continue Melatonin 5 mg nightly  - Improved sleep hygiene encouraged                          3. Medical Issues Being Addressed:              Hyponatremia - Trending BMP for monitoring, Na+ 135 (3/9) - Hope will correct as po intake improves, thus far improving   Prolonged QTC - resolved - EKG shows QTC 4656m 463 on repeat                HTN             -- Continue Norvasc '10mg'$  daily               Low Vit D             -- Continue Vit D 50,000 weekly   Self-care deficits - PT and OT consults obtained for ambulation and assessment for ability to function independently after discharge (PT helping with posture and gait and OT feels he has physical and cognitive abilities to function independently but has psychological challenges limiting his perfomance in IADLs. PT eval to be completed tomorrow (3/7). - Ensure tid and po intake encouraged - MVI ordered daily - SW placed APS referral, 3/5   Back Pain - Volatren gel PRN for back pain and encouraged to be OOB   4. Discharge Planning:              -- Social work and case management to assist with discharge planning and identification of hospital follow-up needs prior to discharge             -- Estimated LOS: 7-10 days             -- Discharge Concerns: Need to establish a safety plan; Medication compliance and effectiveness             -- Discharge Goals: Return home with outpatient referrals for mental health follow-up including medication management/psychotherapy      TiLaretta BolsterFNAstoria/03/2022, 5:22 PM

## 2021-06-19 NOTE — Group Note (Signed)
Date:  06/19/2021 ?Time:  9:34 AM ? ?Group Topic/Focus:  ?Orientation:   The focus of this group is to educate the patient on the purpose and policies of crisis stabilization and provide a format to answer questions about their admission.  The group details unit policies and expectations of patients while admitted. ? ? ? ?Participation Level:  Minimal ? ?Participation Quality:  Inattentive ? ?Affect:  Anxious ? ?Cognitive:  Appropriate ? ?Insight: Appropriate ? ?Engagement in Group:  Limited ? ?Modes of Intervention:  Discussion ? ?Additional Comments:   ? ?Jerrye Beavers ?06/19/2021, 9:34 AM ? ?

## 2021-06-19 NOTE — Progress Notes (Addendum)
D: Patient continues to try and isolate to his room; however, he is attending group activities during the daytime. Patient reports passive SI with no specific plan. He does not appear to be responding to internal stimuli. Patient is compliant with his medications and states that he feels "safe" in the milieu. Patient rates his depression, hopelessness and anxiety as a 7. He has no physical complaints today. ? ?A: Continue to monitor medication management and MD orders.  Safety checks completed every 15 minutes per protocol.  Offer support and encouragement as needed. ? ?R: Patient is receptive to staff; his behavior is appropriate.   ? ? 06/19/21 0900  ?Psych Admission Type (Psych Patients Only)  ?Admission Status Voluntary  ?Psychosocial Assessment  ?Patient Complaints Anxiety;Depression  ?Eye Contact Intense  ?Facial Expression Anxious  ?Affect Anxious;Depressed  ?Speech Soft;Logical/coherent  ?Interaction Minimal  ?Motor Activity Shuffling  ?Appearance/Hygiene Disheveled;Poor hygiene  ?Behavior Characteristics Cooperative;Anxious  ?Mood Depressed;Anxious  ?Thought Process  ?Coherency WDL  ?Content WDL  ?Delusions None reported or observed  ?Perception WDL  ?Hallucination None reported or observed  ?Judgment Impaired  ?Confusion None  ?Danger to Self  ?Current suicidal ideation? Denies  ?Danger to Others  ?Danger to Others None reported or observed  ? ? ?

## 2021-06-19 NOTE — Progress Notes (Signed)
?   06/19/21 2132  ?Psych Admission Type (Psych Patients Only)  ?Admission Status Voluntary  ?Psychosocial Assessment  ?Patient Complaints Anxiety;Depression;Tension;Worrying  ?Eye Contact Glaring;Intense  ?Facial Expression Anxious  ?Affect Anxious;Depressed  ?Speech Logical/coherent;Soft  ?Interaction Forwards little;Minimal;Guarded  ?Motor Activity Shuffling;Slow  ?Appearance/Hygiene Disheveled;Poor hygiene  ?Behavior Characteristics Cooperative;Appropriate to situation  ?Mood Depressed;Anxious  ?Thought Process  ?Coherency WDL  ?Content WDL  ?Delusions None reported or observed  ?Perception WDL  ?Hallucination None reported or observed  ?Judgment Impaired  ?Confusion None  ?Danger to Self  ?Current suicidal ideation? Denies  ?Self-Injurious Behavior No self-injurious ideation or behavior indicators observed or expressed   ?Agreement Not to Harm Self Yes  ?Description of Agreement Verbal Contract  ?Danger to Others  ?Danger to Others None reported or observed  ? ? ?

## 2021-06-19 NOTE — [facility] Group Notes (Signed)
Adult Psychoeducational Group  ?Date:  06/19/2021 ?Time:  5409-8119 ?Group Topic/Focus: Continuation of the group from Saturday. Looking at the lists that were created and talking about what needs to be done with the homework of 30 positives about themselves.  ?                                   Talking about taking their power back and helping themselves to develop a positive self esteem. ?     ?Participation Quality:  Appropriate ? ?Affect:  Appropriate ? ?Cognitive:  Oriented ? ?Insight: Improving ? ?Engagement in Group:  Engaged ? ?Modes of Intervention:  Activity, Discussion, Education, and Support ? ?Additional Comments:  rates his energy at a 4/10. Continues to stand up and sit down in the group due to anxiety. Sits and listens but has not interjected anything. ? ?Casey Silva A ? ?

## 2021-06-19 NOTE — [facility] Group Notes (Signed)
LCSW Group Therapy Note ? ?No social work group was held today due to a scheduling conflict.  Another licensed group was held by an Therapist, sports. ? ?Selmer Dominion, LCSW ?01/29/2021 ?1:54 PM  ?  ? ?

## 2021-06-19 NOTE — Progress Notes (Signed)
The patient rated his day as a 7 or 8 without giving an explanation. His goal for tomorrow is to work on his discharge plans.  ?

## 2021-06-20 ENCOUNTER — Ambulatory Visit (HOSPITAL_COMMUNITY): Payer: No Payment, Other

## 2021-06-20 MED ORDER — PROPRANOLOL HCL 10 MG PO TABS
10.0000 mg | ORAL_TABLET | Freq: Three times a day (TID) | ORAL | Status: DC
  Administered 2021-06-20 – 2021-06-23 (×8): 10 mg via ORAL
  Filled 2021-06-20 (×16): qty 1

## 2021-06-20 NOTE — Group Note (Signed)
Recreation Therapy Group Note ? ? ?Group Topic:Stress Management  ?Group Date: 06/20/2021 ?Start Time: 0930 ?End Time: 0950 ?Facilitators: Victorino Sparrow, LRT, CTRS ?Location: [place] ? ? ?Goal Area(s) Addresses:  ?Patient will identify positive stress management techniques. ?Patient will identify benefits of using stress management post d/c. ?  ?Group Description:  Meditation.  LRT played a meditation that focused on centering yourself to start the day off and prepare for what the day has in store.  Patients were to follow along with the meditation as it played to fully engage with the meditation. ? ? ?Affect/Mood: N/A ?  ?Participation Level: Did not attend ?  ? ?Clinical Observations/Individualized Feedback:   ? ? ?Plan: Continue to engage patient in RT group sessions 2-3x/week. ? ? ?Victorino Sparrow, LRT,CTRS ?06/20/2021 12:11 PM ?

## 2021-06-20 NOTE — Progress Notes (Signed)
?   06/20/21 2122  ?Psych Admission Type (Psych Patients Only)  ?Admission Status Voluntary  ?Psychosocial Assessment  ?Patient Complaints Anxiety;Worrying  ?Eye Contact Intense  ?Facial Expression Anxious  ?Affect Apathetic;Sad  ?Speech Logical/coherent  ?Interaction Minimal;Guarded  ?Motor Activity Shuffling;Slow  ?Appearance/Hygiene Disheveled;Poor hygiene  ?Behavior Characteristics Cooperative;Appropriate to situation  ?Mood Depressed;Anxious  ?Thought Process  ?Coherency WDL  ?Content WDL  ?Delusions None reported or observed  ?Perception WDL  ?Hallucination None reported or observed  ?Judgment Impaired  ?Confusion None  ?Danger to Self  ?Current suicidal ideation? Denies  ?Self-Injurious Behavior No self-injurious ideation or behavior indicators observed or expressed   ?Agreement Not to Harm Self Yes  ?Description of Agreement Verbal contract  ?Danger to Others  ?Danger to Others None reported or observed  ? ? ?

## 2021-06-20 NOTE — Progress Notes (Addendum)
[facility] MD Progress Note  06/20/2021 2:58 PM Casey Silva  MRN:  [id number]  Subjective:  " I am not feeling good. I am anxious but not as anxious. The depression is a little better. The new medication is making me groggy. I am a little bit suicidal. I have a knife at home."  Today's assessment (06/20/2021): Pt's chart is reviewed, his case discussed with treatment team. Pt was in group session, was observed to be restless, and unable to stay seated, and was taken out of group for this encounter. Pt and writer ambulated to his room where he was fidgety & restless while answering assessment questions. Pt reported being "very anxious", reported that his depressive symptoms are getting better, endorsed +SI, when asked if he has a plan, he stated: "I have a knife at home." Pt denied having a plan to hurt himself while at the hospital. He denies auditory/visual disturbances, denied paranoia, and there is no evidence of any delusional thoughts. He is verbally contracting for safety while hospitalized.  Pt with flat affect and depressed mood, attention to personal hygiene and grooming is fair, eye contact is good, speech is clear & coherent. Thought contents are organized and logical. He appears disheveled, has poor motivation to complete self care, ruminates about "my life in general" and "loneliness" as being his current stressors. He reports his sleep quality last night as being "pretty poor", reports waking up at 3.45 am this morning and unable to go back to sleep. As per flow sheets, he slept a total of 6.5 hrs last night. He reports a good appetite, rates his depression as 7 (10 being the worst), rates anxiety as 8 (10 being worst). He reports that he is tolerating his medications well overall, but has feelings of tiredness in the  mornings. Pt verbalizes not being ready for discharge, states he needs a day or two. Pt consistently asking for his landlord to be contacted regarding his rent being past due because of  current hospitalization, but is unable to provide landlord's phone number. He states that the landlord's name is Casey Silva, but and that the address is [address]. Social work is unable to find his landlord's number at this time. Inderal increased to 10 mg TID for management of anxiety.Other medications remain same.  HPI: As per HPI from pt's initial presentation to the [facility]: "Casey Silva is a 66 y.o. male with history of anxiety, depression, and bipolar 1 disorder who presents to the emergency department with complaints of anxiety, depression, and suicidal ideation.  Patient states he ran out of his psychiatric medicines 3 days ago which is led to a significant decline in his mental health.  He states he feels very anxious.  He states he just does not want to deal with everything anymore."  Labs reviewed: Na+level on admission was 130, sodium from 06/19/21 is 135-WNL, reviewed. LDL-123, EKG from 2/28 with prolonged QTC at 504, repeated EKG-NSR and QTC of 463  Principal Problem: MDD (major depressive disorder), recurrent, severe, with psychosis ([place])  Diagnosis: Principal Problem:   MDD (major depressive disorder), recurrent, severe, with psychosis ([place]) Active Problems:   GAD (generalized anxiety disorder)   Tobacco user   Vitamin D deficiency  Total Time spent with patient: 20 minutes  Past Psychiatric History:  MDD & anxiety  Past Medical History:  Past Medical History:  Diagnosis Date   Anxiety    Asthma    Depression    History reviewed. No pertinent surgical history.  Family History:  Family History  Problem Relation Age of Onset   Bipolar disorder Mother    Family Psychiatric  History:   Bipolar disorder Mother      Social History:  Social History   Substance and Sexual Activity  Alcohol Use No     Social History   Substance and Sexual Activity  Drug Use No    Social History   Socioeconomic History   Marital status: Single    Spouse name: Not on  file   Number of children: 1   Years of education: Not on file   Highest education level: GED or equivalent  Occupational History   Not on file  Tobacco Use   Smoking status: Never   Smokeless tobacco: Never  Vaping Use   Vaping Use: Unknown  Substance and Sexual Activity   Alcohol use: No   Drug use: No   Sexual activity: Not Currently  Other Topics Concern   Not on file  Social History Narrative   Marital status:  Single   Children: Daughter   Employment:  Unemployment. previous work for [employer].   Alcohol:  None   Drugs:  none   Exercise:  Regularly very   Religion:  Jewish   Social Determinants of Radio broadcast assistant Strain: Not on file  Food Insecurity: Not on file  Transportation Needs: Not on file  Physical Activity: Not on file  Stress: Not on file  Social Connections: Not on file   Additional Social History:    Sleep: Good  Appetite:  Good  Current Medications: Current Facility-Administered Medications  Medication Dose Route Frequency Provider Last Rate Last Admin   acetaminophen (TYLENOL) tablet 650 mg  650 mg Oral Q6H PRN Suella Broad, FNP   650 mg at 06/11/21 1430   alum & mag hydroxide-simeth (MAALOX/MYLANTA) 200-200-20 MG/5ML suspension 30 mL  30 mL Oral Q4H PRN Starkes-Perry, Gayland Curry, FNP       amLODipine (NORVASC) tablet 10 mg  10 mg Oral Daily Prospero Mahnke, NP   10 mg at 06/20/21 0746   diclofenac Sodium (VOLTAREN) 1 % topical gel 2 g  2 g Topical QID Damita Dunnings B, MD   2 g at 06/12/21 1314   feeding supplement (ENSURE ENLIVE / ENSURE PLUS) liquid 237 mL  237 mL Oral BID BM Paul Trettin, NP   237 mL at 06/20/21 1446   gabapentin (NEURONTIN) capsule 300 mg  300 mg Oral TID Maida Sale, MD   300 mg at 06/20/21 1414   influenza vac split quadrivalent PF (FLUARIX) injection 0.5 mL  0.5 mL Intramuscular Tomorrow-1000 Sandria Mcenroe, NP       magnesium hydroxide (MILK OF MAGNESIA) suspension 30 mL  30 mL Oral Daily  PRN Suella Broad, FNP   30 mL at 06/09/21 1611   melatonin tablet 5 mg  5 mg Oral QHS Suella Broad, FNP   5 mg at 06/19/21 2132   mirtazapine (REMERON) tablet 30 mg  30 mg Oral QHS Suella Broad, FNP   30 mg at 06/19/21 2132   multivitamin with minerals tablet 1 tablet  1 tablet Oral Daily Viann Fish E, MD   1 tablet at 06/20/21 0745   OLANZapine (ZYPREXA) tablet 2.5 mg  2.5 mg Oral Daily Nicholes Rough, NP   2.5 mg at 06/20/21 0745   OLANZapine (ZYPREXA) tablet 5 mg  5 mg Oral QHS Tomeeka Plaugher, NP   5 mg at 06/19/21  2132   propranolol (INDERAL) tablet 10 mg  10 mg Oral TID Nicholes Rough, NP   10 mg at 06/20/21 1447   traZODone (DESYREL) tablet 100 mg  100 mg Oral QHS Lively Haberman, NP   100 mg at 06/19/21 2132   venlafaxine XR (EFFEXOR-XR) 24 hr capsule 112.5 mg  112.5 mg Oral Q breakfast Hill, Jackie Plum, MD   112.5 mg at 06/20/21 6578   Vitamin D (Ergocalciferol) (DRISDOL) capsule 50,000 Units  50,000 Units Oral Q7 days Harlow Asa, MD   50,000 Units at 06/18/21 1650   Lab Results: No results found for this or any previous visit (from the past 48 hour(s)).  Blood Alcohol level:  Lab Results  Component Value Date   ETH <10 06/07/2021   ETH <10 46/96/2952    Metabolic Disorder Labs: Lab Results  Component Value Date   HGBA1C 5.1 06/07/2021   MPG 99.67 06/07/2021   MPG 111.15 11/24/2020   No results found for: PROLACTIN Lab Results  Component Value Date   CHOL 197 06/07/2021   TRIG 149 06/07/2021   HDL 44 06/07/2021   CHOLHDL 4.5 06/07/2021   VLDL 30 06/07/2021   LDLCALC 123 (H) 06/07/2021   LDLCALC 148 (H) 11/24/2020   Physical Findings: AIMS: Facial and Oral Movements Muscles of Facial Expression: None, normal Lips and Perioral Area: None, normal Jaw: None, normal Tongue: None, normal,Extremity Movements Upper (arms, wrists, hands, fingers): None, normal Lower (legs, knees, ankles, toes): None, normal, Trunk  Movements Neck, shoulders, hips: None, normal, Overall Severity Severity of abnormal movements (highest score from questions above): None, normal Incapacitation due to abnormal movements: None, normal Patient's awareness of abnormal movements (rate only patient's report): No Awareness, Dental Status Current problems with teeth and/or dentures?: No Does patient usually wear dentures?: No  CIWA:    COWS:     Musculoskeletal: Strength & Muscle Tone: within normal limits Gait & Station: normal Patient leans: N/A  Psychiatric Specialty Exam:  Presentation  General Appearance: Disheveled  Eye Contact:Fair  Speech:Clear and Coherent  Speech Volume:Decreased  Handedness:Right  Mood and Affect  Mood:Anxious; Depressed; Hopeless  Affect:Congruent  Thought Process  Thought Processes:Coherent  Descriptions of Associations:Intact  Orientation:Full (Time, Place and Person)  Thought Content:Logical  History of Schizophrenia/Schizoaffective disorder:No  Duration of Psychotic Symptoms:Greater than six months  Hallucinations:Hallucinations: None  Ideas of Reference:None  Suicidal Thoughts:Suicidal Thoughts: Yes, Active SI Active Intent and/or Plan: Without Plan; Without Means to Carry Out SI Passive Intent and/or Plan: Without Intent; Without Plan; Without Means to Carry Out  Homicidal Thoughts:Homicidal Thoughts: No  Sensorium  Memory:Immediate Good  Judgment:Poor  Insight:Poor  Executive Functions  Concentration:Poor  Attention Span:Fair  [place]  Language:Good  Psychomotor Activity  Psychomotor Activity:Psychomotor Activity: Normal  Assets  Assets:Communication Skills; Housing  Sleep  Sleep:Sleep: Fair Number of Hours of Sleep: 5  Physical Exam: Physical Exam Constitutional:      Appearance: Normal appearance.  HENT:     Head: Normocephalic and atraumatic.     Right Ear: External ear normal.     Left Ear: External  ear normal.     Nose: Nose normal.     Mouth/Throat:     Mouth: Mucous membranes are moist.     Pharynx: Oropharynx is clear.  Eyes:     Extraocular Movements: Extraocular movements intact.     Conjunctiva/sclera: Conjunctivae normal.     Pupils: Pupils are equal, round, and reactive to light.  Cardiovascular:  Rate and Rhythm: Normal rate.     Pulses: Normal pulses.  Pulmonary:     Effort: Pulmonary effort is normal.  Abdominal:     Palpations: Abdomen is soft.  Genitourinary:    Comments: Deferred Musculoskeletal:        General: Normal range of motion.     Cervical back: Normal range of motion and neck supple.  Skin:    General: Skin is warm.  Neurological:     General: No focal deficit present.     Mental Status: He is alert and oriented to person, place, and time.   Review of Systems  Constitutional:  Negative for chills and fever.  HENT: Negative.  Negative for ear pain and hearing loss.   Eyes: Negative.  Negative for blurred vision, double vision, photophobia, pain, discharge and redness.  Respiratory: Negative.  Negative for cough, sputum production, shortness of breath and wheezing.   Cardiovascular: Negative.  Negative for chest pain and palpitations.  Gastrointestinal: Negative.  Negative for abdominal pain, blood in stool, constipation, diarrhea, heartburn, melena, nausea and vomiting.  Genitourinary: Negative.  Negative for dysuria, frequency and urgency.  Musculoskeletal: Negative.   Skin: Negative.  Negative for itching and rash.  Neurological:  Negative for dizziness, tingling, tremors, sensory change, speech change, focal weakness, seizures, loss of consciousness, weakness and headaches.  Endo/Heme/Allergies: Negative.        Dust Mite Mixed Allergen Ext [Mite (D. Farinae)] Dust Mite Mixed Allergen Ext [Mite (D. Farinae)]  Shortness Of Breath High Allergy 09/23/2019 Deletion Reason:   Bee Pollen Bee Pollen  Other (See  Comments) Low Allergy 09/23/2019 Watery Eyes    Psychiatric/Behavioral:  Positive for depression. The patient is nervous/anxious and has insomnia.   Blood pressure 124/83, pulse 83, temperature 98.1 F (36.7 C), temperature source Oral, resp. rate 16, height 5' 6.54" (1.69 m), weight 71.8 kg, SpO2 98 %. Body mass index is 25.13 kg/m.  Treatment Plan Summary: Daily contact with patient to assess and evaluate symptoms and progress in treatment and Medication management  MDD recurrent severe with psychotic features GAD by hx   PLAN: Safety and Monitoring:             -- Voluntary admission to inpatient psychiatric unit for safety, stabilization and treatment             -- Daily contact with patient to assess and evaluate symptoms and progress in treatment             -- Patient's case to be discussed in multi-disciplinary team meeting             -- Observation Level : q15 minute checks             -- Vital signs:  q12 hours             -- Precautions: suicide, elopement, and assault   2. Psychiatric Diagnoses and Treatment:              MDD recurrent severe with psychotic features -Continue Zyprexa 2.5 mg in the mornings -Continue Zyprexa 5 mg nightly for psychosis (monitoring of QTC) - Continue Effexor XR to 75 mg and monitor Na+ closely, and repeat on 3/9 to make sure it is continuing to trend up. 06/17/21 Sodium level is 135. (Effexor was increased  to 75 mg daily effective (3/7) for management of depressive symptoms & on 06/18/21 Effexor XR was increased to 112.5)  -Continue Remeron 30 mg nightly   GAD by hx -  Increase Inderal to '10mg'$  TID, for anxiety -Increase Gabapentin to 300 mg TID for anxiety -Discontinued Hydroxyzine 25 mg PRN as it might be contributing to restlessness.   - Prompting to attend to ADLs and to eat  - MOCA 28/30 - Continue room lock out for meals and groups             -- Metabolic profile and EKG monitoring obtained while on an atypical antipsychotic  (Lipid Panel: WNL except for LDL 123; HbgA1c:5.1; QTc:444m, B12- WNL, Vit D- 14.94, Folate- WNL) Repeat EKG- 463 (3/5)             -- Encouraged patient to participate in unit milieu and in scheduled group therapies                Insomnia -Continue Trazodone 100 mg nightly (monitoring QTC) -Continue Melatonin 5 mg nightly                           3. Medical Issues Being Addressed:              Hyponatremia - Trending BMP for monitoring, Na+ 135 (3/9) - Hope will correct as po intake improves, thus far improving   Prolonged QTC - resolved - EKG shows QTC 4657m 463 on repeat                HTN             -- Continue Norvasc '10mg'$  daily               Low Vit D             -- Continue Vit D 50,000 weekly   Self-care deficits - PT and OT consults obtained for ambulation and assessment for ability to function independently after discharge (PT helping with posture and gait and OT feels he has physical and cognitive abilities to function independently but has psychological challenges limiting his perfomance in IADLs. PT eval to be completed tomorrow (3/7). - Ensure tid and po intake encouraged - MVI ordered daily - SW placed APS referral, 3/5   Back Pain - Volatren gel PRN for back pain and encouraged to be OOB   4. Discharge Planning:              -- Social work and case management to assist with discharge planning and identification of hospital follow-up needs prior to discharge             -- Estimated LOS: 7-10 days             -- Discharge Concerns: Need to establish a safety plan; Medication compliance and effectiveness             -- Discharge Goals: Return home with outpatient referrals for mental health follow-up including medication management/psychotherapy     DoNicholes RoughNP 06/20/2021, 2:58 PM Patient ID: Casey Silva   DOB: [id number]   MRN: [id number]

## 2021-06-20 NOTE — [facility] Counselor (Signed)
CSW reached out to patient landlord, Hazeline Junker, and discussed payment of rent. Patient still has a room at the boarding house and he is welcome back.  Hazeline Junker reports that patient can pay when he gets out.  He requested a courtesy call when he is discharged and to make sure he is aware if patient is not discharged by this Friday.  ? ?Hazeline Junker: [phone] ? ? ?Lamica Mccart, LCSW, LCAS ?Clincal Social Worker  ?[facility] ? ?

## 2021-06-20 NOTE — Progress Notes (Signed)
?   06/20/21 0910  ?Psych Admission Type (Psych Patients Only)  ?Admission Status Voluntary  ?Psychosocial Assessment  ?Patient Complaints Anxiety;Worrying;Depression  ?Eye Contact Intense  ?Facial Expression Anxious  ?Affect Apathetic;Depressed  ?Speech Logical/coherent  ?Interaction Minimal;Guarded  ?Motor Activity Shuffling;Slow  ?Appearance/Hygiene Disheveled;Poor hygiene  ?Behavior Characteristics Cooperative  ?Mood Anxious;Depressed  ?Thought Process  ?Coherency WDL  ?Content WDL  ?Delusions None reported or observed  ?Perception WDL  ?Hallucination None reported or observed  ?Judgment Impaired  ?Confusion None  ?Danger to Self  ?Current suicidal ideation? Denies  ?Self-Injurious Behavior No self-injurious ideation or behavior indicators observed or expressed   ?Agreement Not to Harm Self Yes  ?Description of Agreement verbally contract for safety  ?Danger to Others  ?Danger to Others None reported or observed  ? ? ?

## 2021-06-20 NOTE — [facility] Group Notes (Signed)
LCSW Group Therapy Note ?  ?  ?Group Date: 06/20/2021 ?Start Time: 1300 ?End Time: 1400 ?  ?Type of Therapy and Topic:  Group Therapy:  Strengths Exploration ?  ?  ?Participation Level: Active ?  ?Description of Group: ?This group allows individuals to explore their strengths, learn to use strengths in new ways to improve well-being. Strengths-based interventions involve identifying strengths, understanding how they are used, and learning new ways to apply them. Individuals will identify their strengths, and then explore their roles in different areas of life (relationships, professional life, and personal fulfillment). Individuals will think about ways in which they currently use their strengths, along with new ways they could begin using them. ?  ?  ?Therapeutic Goals ?Patient will verbalize two of their strengths ?Patient will identify how their strengths are currently used ?Patient will identify two new ways to apply their strengths  ?Patients will create a plan to apply their strengths in their daily lives  ?  ?  ?Summary of Patient Progress:  The Pt attended group and remained there the entire time.  They accepted the worksheets that were provided, followed along with that worksheet, and remained on topic.  The Pt was appropriate with their peers and demonstrated understanding of the topic that was discussed.   ?  ?Darleen Crocker, LCSWA ?06/20/2021  2:17 PM   ?

## 2021-06-20 NOTE — [facility] Counselor (Signed)
CSW attempted to call landlord/property manager, Hazeline Junker: [phone].  CSW left a HIPPA compliant voicemail for a call back.  CSW asked to call landlord to arrange rent payment.  ? ? ?Nell Gales, LCSW, LCAS ?Clincal Social Worker  ?[facility] ? ?

## 2021-06-20 NOTE — Group Note (Unsigned)
LCSW Group Therapy Note ? ? ?Group Date: 06/20/2021 ?Start Time: 1300 ?End Time: 1400 ? ? ?Type of Therapy and Topic:  Group Therapy:  ? ?Participation Level:  {[facility] PARTICIPATION LEVEL:22264} ? ?Description of Group: ? ? ?Therapeutic Goals: ? ?1.   ? ? ?Summary of Patient Progress:   ? ?*** ? ?Therapeutic Modalities:  ? ?Darleen Crocker, LCSWA ?06/20/2021  2:27 PM   ? ?

## 2021-06-20 NOTE — Progress Notes (Signed)
Adult Psychoeducational Group Note ? ?Date:  06/20/2021 ?Time:  11:51 AM ? ?Group Topic/Focus:  ?Goals Group:   The focus of this group is to help patients establish daily goals to achieve during treatment and discuss how the patient can incorporate goal setting into their daily lives to aide in recovery. ? ?Participation Level:  Active ? ?Participation Quality:  Appropriate ? ?Affect:  Appropriate ? ?Cognitive:  Appropriate ? ?Insight: Appropriate ? ?Engagement in Group:  Engaged ? ?Modes of Intervention:  Discussion ? ?Additional Comments:  Patient attended morning orientation/goal's group and said that his goal for today is to speak to his Physician about his medications.  ? ?Casey Silva W Burdette Forehand ?12/11/7953, 11:51 AM ?

## 2021-06-20 NOTE — Plan of Care (Signed)
?  Problem: Coping: ?Goal: Ability to demonstrate self-control will improve ?Outcome: Progressing ?  ?Problem: Health Behavior/Discharge Planning: ?Goal: Compliance with treatment plan for underlying cause of condition will improve ?Outcome: Progressing ?  ?Problem: Safety: ?Goal: Periods of time without injury will increase ?Outcome: Progressing ?  ?Problem: Medication: ?Goal: Compliance with prescribed medication regimen will improve ?Outcome: Progressing ?  ?

## 2021-06-20 NOTE — [facility] Group Notes (Signed)
Spiritual care group on grief and loss facilitated by chaplains Amy Burris, [facility] and Janne Napoleon, [facility]  ? ?Group Goal:  ?Support / Education around grief and loss  ? ?Members engage in facilitated group support and psycho-social education.  ? ?Group Description:  ?Spiritual care group on grief and loss facilitated by chaplain Janne Napoleon, [facility]  ? ?Group Goal:  ? ?Support / Education around grief and loss  ? ?Members engage in facilitated group support and psycho-social education.  ? ?Group Description:  ? ?Following introductions and group rules, group members engaged in facilitated group dialog and support around topic of loss, with particular support around experiences of loss in their lives. Group Identified types of loss (relationships / self / things) and identified patterns, circumstances, and changes that precipitate losses. Reflected on thoughts / feelings around loss, normalized grief responses, and recognized variety in grief experience. Group noted Worden's four tasks of grief in discussion.  ? ?Group drew on Adlerian / Rogerian, narrative, MI,  ? ?Patient Progress: Pt attended group. Pt did not contribute to the discussion. Based upon body language and lack of eye contact or other signs of listening, unclear how much Pt was engaged in the topic or following the discussion. Minimal participation. Frequently got up and seemed distracted. ?

## 2021-06-20 NOTE — Progress Notes (Signed)
Patient attended the entire group but appeared to be very anxious, i.e.. Standing up and sitting down. ?

## 2021-06-21 LAB — RESP PANEL BY RT-PCR (FLU A&B, COVID) ARPGX2
Influenza A by PCR: NEGATIVE
Influenza B by PCR: NEGATIVE
SARS Coronavirus 2 by RT PCR: NEGATIVE

## 2021-06-21 MED ORDER — ONDANSETRON 4 MG PO TBDP
4.0000 mg | ORAL_TABLET | ORAL | Status: DC | PRN
  Administered 2021-06-21 – 2021-06-22 (×2): 4 mg via ORAL
  Filled 2021-06-21 (×2): qty 1

## 2021-06-21 NOTE — Progress Notes (Signed)
Adult Psychoeducational Group Note ? ?Date:  06/21/2021 ?Time:  11:21 PM ? ?Group Topic/Focus:  ?Wrap-Up Group:   The focus of this group is to help patients review their daily goal of treatment and discuss progress on daily workbooks. ? ?Participation Level:  Did Not Attend ? ?Participation Quality:   Did Not Attend ? ?Affect:   Did Not Attend ? ?Cognitive:   Did Not Attend ? ?Insight: None ? ?Engagement in Group:   Did Not Attend ? ?Modes of Intervention:   Did Not Attend ? ?Additional Comments:  Pt was encouraged to attend wrap up group but did not attend. ? ?Candy Sledge ?06/21/2021, 11:21 PM ?

## 2021-06-21 NOTE — Group Note (Signed)
Date:  06/21/2021 ?Time:  4:00 PM ? ?Group Topic/Focus:  ?Self Care:   The focus of this group is to help patients understand the importance of self-care in order to improve or restore emotional, physical, spiritual, interpersonal, and financial health. ? ? ? ?Participation Level:  Did Not Attend ? ?Participation Quality:   ? ?Affect:   ? ?Cognitive:   ? ?Insight:  ? ?Engagement in Group:   ? ?Modes of Intervention:   ? ?Additional Comments:   ? ?Jerrye Beavers ?06/21/2021, 4:00 PM ? ?

## 2021-06-21 NOTE — Progress Notes (Signed)
?   06/21/21 1200  ?Psych Admission Type (Psych Patients Only)  ?Admission Status Voluntary  ?Psychosocial Assessment  ?Patient Complaints Anxiety;Worrying  ?Eye Contact Intense  ?Facial Expression Anxious  ?Affect Sad  ?Speech Logical/coherent  ?Interaction Minimal  ?Motor Activity Shuffling;Slow  ?Appearance/Hygiene Disheveled  ?Behavior Characteristics Cooperative  ?Mood Depressed;Labile  ?Thought Process  ?Coherency WDL  ?Content WDL  ?Delusions None reported or observed  ?Perception WDL  ?Hallucination None reported or observed  ?Judgment Impaired  ?Confusion None  ?Danger to Self  ?Current suicidal ideation? Denies  ?Danger to Others  ?Danger to Others None reported or observed  ? ? ?

## 2021-06-21 NOTE — Progress Notes (Signed)
[facility] MD Progress Note ? ?06/21/2021 5:08 PM ?Casey Silva  ?MRN:  [id number] ? ?Subjective:  " I am not feeling good today. My stomach is upset, and I am throwing up."  ? ?Today's assessment (06/21/2021): Pt's chart is reviewed, his case discussed with treatment team. Pt with flat affect and depressed mood, attention to personal hygiene and grooming is fair, eye contact is good, speech is clear & coherent. Thought contents are organized and logical, and pt currently endorses +SI, denies having a plan. He denies HI/AVH or paranoia. There is no evidence of delusional thoughts.  Pt is seen in his room on the 400 hall for this encounter. He is lying in bed during assessment, he verbalizes not feeling well today, states that he has an upset stomach and is vomiting. Covid & Flu tests ordered, and negative for both. Zofran 4 mg ordered PRN for nausea, and given to pt. Pt is continuing to be restless, verbalizing feelings of loneliness, states that this is his concern for discharge. He reports having a knife at unit which he can use to hurt himself if he goes home, but is verbally contracting for safety at this hospital. ? ?Pt reports that his sleep quality last night was fair, & as per flow sheets, he slept a total of 6.25 hrs. He reports a poor appetite due to the nausea, but states that he ate breakfast  which might have caused the nausea. He states that he ate grits, eggs and bacon and the nausea started. Pt denies any medication related side effects. Attending, Physician is coordinating with Dr. Weber Cooks at the [facility] for a possible transfer to their geriatric psychiatry unit. Will continue current medications with no changes today. ? ?HPI: As per HPI from pt's initial presentation to the [facility]: "Casey Silva is a 66 y.o. male with history of anxiety, depression, and bipolar 1 disorder who presents to the emergency department with complaints of anxiety, depression, and suicidal ideation.  Patient states he  ran out of his psychiatric medicines 3 days ago which is led to a significant decline in his mental health.  He states he feels very anxious.  He states he just does not want to deal with everything anymore." ? ?Labs reviewed: Na+level on admission was 130, sodium from 06/19/21 is 135-WNL, reviewed. LDL-123, EKG from 2/28 with prolonged QTC at 504, repeated EKG-NSR and QTC of 463 ? ?Principal Problem: MDD (major depressive disorder), recurrent, severe, with psychosis ([place]) ? ?Diagnosis: Principal Problem: ?  MDD (major depressive disorder), recurrent, severe, with psychosis ([place]) ?Active Problems: ?  GAD (generalized anxiety disorder) ?  Tobacco user ?  Vitamin D deficiency ? ?Total Time spent with patient: 20 minutes ? ?Past Psychiatric History:  MDD & anxiety ? ?Past Medical History:  ?Past Medical History:  ?Diagnosis Date  ? Anxiety   ? Asthma   ? Depression   ? History reviewed. No pertinent surgical history. ?Family History:  ?Family History  ?Problem Relation Age of Onset  ? Bipolar disorder Mother   ? ?Family Psychiatric  History:  ? Bipolar disorder Mother    ?  ?Social History:  ?Social History  ? ?Substance and Sexual Activity  ?Alcohol Use No  ?   ?Social History  ? ?Substance and Sexual Activity  ?Drug Use No  ?  ?Social History  ? ?Socioeconomic History  ? Marital status: Single  ?  Spouse name: Not on file  ? Number of children: 1  ? Years of education: Not  on file  ? Highest education level: GED or equivalent  ?Occupational History  ? Not on file  ?Tobacco Use  ? Smoking status: Never  ? Smokeless tobacco: Never  ?Vaping Use  ? Vaping Use: Unknown  ?Substance and Sexual Activity  ? Alcohol use: No  ? Drug use: No  ? Sexual activity: Not Currently  ?Other Topics Concern  ? Not on file  ?Social History Narrative  ? Marital status:  Single   Children: Daughter   Employment:  Unemployment. previous work for [employer].   Alcohol:  None   Drugs:  none   Exercise:  Regularly very   Religion:  Jewish   ? ?Social Determinants of Health  ? ?Financial Resource Strain: Not on file  ?Food Insecurity: Not on file  ?Transportation Needs: Not on file  ?Physical Activity: Not on file  ?Stress: Not on file  ?Social Connections: Not on file  ? ?Additional Social History:  ?  ?Sleep: Good ? ?Appetite:  Good ? ?Current Medications: ?Current Facility-Administered Medications  ?Medication Dose Route Frequency Provider Last Rate Last Admin  ? acetaminophen (TYLENOL) tablet 650 mg  650 mg Oral Q6H PRN Suella Broad, FNP   650 mg at 06/11/21 1430  ? alum & mag hydroxide-simeth (MAALOX/MYLANTA) 200-200-20 MG/5ML suspension 30 mL  30 mL Oral Q4H PRN Suella Broad, FNP      ? amLODipine (NORVASC) tablet 10 mg  10 mg Oral Daily Nicholes Rough, NP   10 mg at 06/21/21 0813  ? diclofenac Sodium (VOLTAREN) 1 % topical gel 2 g  2 g Topical QID Damita Dunnings B, MD   2 g at 06/12/21 1314  ? feeding supplement (ENSURE ENLIVE / ENSURE PLUS) liquid 237 mL  237 mL Oral BID BM Robi Dewolfe, NP   237 mL at 06/21/21 1527  ? gabapentin (NEURONTIN) capsule 300 mg  300 mg Oral TID Maida Sale, MD   300 mg at 06/21/21 1133  ? influenza vac split quadrivalent PF (FLUARIX) injection 0.5 mL  0.5 mL Intramuscular Tomorrow-1000 Joselle Deeds, NP      ? magnesium hydroxide (MILK OF MAGNESIA) suspension 30 mL  30 mL Oral Daily PRN Suella Broad, FNP   30 mL at 06/09/21 1611  ? melatonin tablet 5 mg  5 mg Oral QHS Suella Broad, FNP   5 mg at 06/20/21 2122  ? mirtazapine (REMERON) tablet 30 mg  30 mg Oral QHS Suella Broad, FNP   30 mg at 06/20/21 2122  ? multivitamin with minerals tablet 1 tablet  1 tablet Oral Daily Harlow Asa, MD   1 tablet at 06/21/21 0813  ? OLANZapine (ZYPREXA) tablet 2.5 mg  2.5 mg Oral Daily Nicholes Rough, NP   2.5 mg at 06/21/21 6270  ? OLANZapine (ZYPREXA) tablet 5 mg  5 mg Oral QHS Nicholes Rough, NP   5 mg at 06/20/21 2122  ? ondansetron (ZOFRAN-ODT) disintegrating  tablet 4 mg  4 mg Oral Q4H PRN Nicholes Rough, NP   4 mg at 06/21/21 1133  ? propranolol (INDERAL) tablet 10 mg  10 mg Oral TID Nicholes Rough, NP   10 mg at 06/21/21 1528  ? traZODone (DESYREL) tablet 100 mg  100 mg Oral QHS Nicholes Rough, NP   100 mg at 06/20/21 2122  ? venlafaxine XR (EFFEXOR-XR) 24 hr capsule 112.5 mg  112.5 mg Oral Q breakfast Hill, Jackie Plum, MD   112.5 mg at 06/21/21 0813  ?  Vitamin D (Ergocalciferol) (DRISDOL) capsule 50,000 Units  50,000 Units Oral Q7 days Harlow Asa, MD   50,000 Units at 06/18/21 1650  ? ?Lab Results:  ?Results for orders placed or performed during the hospital encounter of 06/07/21 (from the past 48 hour(s))  ?Resp Panel by RT-PCR (Flu A&B, Covid) Nasopharyngeal Swab     Status: None  ? Collection Time: 06/21/21 11:59 AM  ? Specimen: Nasopharyngeal Swab; Nasopharyngeal(NP) swabs in vial transport medium  ?Result Value Ref Range  ? SARS Coronavirus 2 by RT PCR NEGATIVE NEGATIVE  ?  Comment: (NOTE) ?SARS-CoV-2 target nucleic acids are NOT DETECTED. ? ?The SARS-CoV-2 RNA is generally detectable in upper respiratory ?specimens during the acute phase of infection. The lowest ?concentration of SARS-CoV-2 viral copies this assay can detect is ?138 copies/mL. A negative result does not preclude SARS-Cov-2 ?infection and should not be used as the sole basis for treatment or ?other patient management decisions. A negative result may occur with  ?improper specimen collection/handling, submission of specimen other ?than nasopharyngeal swab, presence of viral mutation(s) within the ?areas targeted by this assay, and inadequate number of viral ?copies(<138 copies/mL). A negative result must be combined with ?clinical observations, patient history, and epidemiological ?information. The expected result is Negative. ? ?Fact Sheet for Patients:  ?EntrepreneurPulse.com.au ? ?Fact Sheet for Healthcare Providers:   ?IncredibleEmployment.be ? ?This test is no t yet approved or cleared by the [place] FDA and  ?has been authorized for detection and/or diagnosis of SARS-CoV-2 by ?FDA under an Emergency Use Authorization (EUA). This EUA will r

## 2021-06-21 NOTE — Progress Notes (Signed)
?   06/21/21 2134  ?Psych Admission Type (Psych Patients Only)  ?Admission Status Voluntary  ?Psychosocial Assessment  ?Patient Complaints Anxiety;Worrying  ?Eye Contact Glaring;None  ?Facial Expression Anxious  ?Affect Sad  ?Speech Logical/coherent  ?Interaction Minimal  ?Motor Activity Shuffling;Slow  ?Appearance/Hygiene Disheveled  ?Behavior Characteristics Cooperative;Appropriate to situation  ?Mood Depressed  ?Thought Process  ?Coherency WDL  ?Content WDL  ?Delusions None reported or observed  ?Perception WDL  ?Hallucination None reported or observed  ?Judgment Impaired  ?Confusion None  ?Danger to Self  ?Current suicidal ideation? Denies  ?Self-Injurious Behavior No self-injurious ideation or behavior indicators observed or expressed   ?Agreement Not to Harm Self Yes  ?Description of Agreement Verbal contract  ?Danger to Others  ?Danger to Others None reported or observed  ? ? ?

## 2021-06-21 NOTE — Group Note (Signed)
Recreation Therapy Group Note ? ? ?Group Topic:Animal Assisted Therapy   ?Group Date: 06/21/2021 ?Start Time: 1430 ?End Time: 4709 ?Facilitators: Victorino Sparrow, LRT,CTRS ?Location: [place] ? ? ?Animal-Assisted Activity (AAA) Program Checklist/Progress Note ?Patient Eligibility Criteria Checklist & Daily Group note for Rec Tx Intervention ? ?AAA/T Program Assumption of Risk Form signed by Patient/ or Parent Legal Guardian YES ? ?Patient understands their participation is voluntary YES ? ?Group Description: Patients provided opportunity to interact with trained and credentialed [employer] Therapy dog and the community volunteer/dog handler. Patients practiced appropriate animal interaction and were educated on dog safety outside of the hospital in common community settings. Patients were allowed to use dog toys and other items to practice commands, engage the dog in play, and/or complete routine aspects of animal care.  ? ?Education: Contractor, Pensions consultant, Communication & Social Skills  ? ? ?Affect/Mood: N/A ?  ?Participation Level: Did not attend ?  ? ?Clinical Observations/Individualized Feedback:   ? ? ?Plan: Continue to engage patient in RT group sessions 2-3x/week. ? ? ?Victorino Sparrow, LRT,CTRS ?06/21/2021 4:12 PM ?

## 2021-06-22 ENCOUNTER — Encounter (HOSPITAL_COMMUNITY): Payer: Self-pay

## 2021-06-22 MED ORDER — GABAPENTIN 400 MG PO CAPS
400.0000 mg | ORAL_CAPSULE | Freq: Three times a day (TID) | ORAL | Status: DC
  Administered 2021-06-22 – 2021-06-23 (×4): 400 mg via ORAL
  Filled 2021-06-22 (×8): qty 1

## 2021-06-22 MED ORDER — LORAZEPAM 0.5 MG PO TABS
0.5000 mg | ORAL_TABLET | Freq: Two times a day (BID) | ORAL | Status: DC
  Administered 2021-06-22 – 2021-06-23 (×3): 0.5 mg via ORAL
  Filled 2021-06-22 (×3): qty 1

## 2021-06-22 MED ORDER — OLANZAPINE 2.5 MG PO TABS
2.5000 mg | ORAL_TABLET | Freq: Once | ORAL | Status: AC
  Administered 2021-06-23: 2.5 mg via ORAL
  Filled 2021-06-22: qty 1

## 2021-06-22 MED ORDER — OLANZAPINE 2.5 MG PO TABS
2.5000 mg | ORAL_TABLET | Freq: Once | ORAL | Status: AC
  Administered 2021-06-22: 2.5 mg via ORAL
  Filled 2021-06-22: qty 1

## 2021-06-22 MED ORDER — QUETIAPINE FUMARATE 25 MG PO TABS
25.0000 mg | ORAL_TABLET | Freq: Two times a day (BID) | ORAL | Status: DC
  Administered 2021-06-22 – 2021-06-23 (×3): 25 mg via ORAL
  Filled 2021-06-22 (×7): qty 1

## 2021-06-22 MED ORDER — QUETIAPINE FUMARATE 100 MG PO TABS
100.0000 mg | ORAL_TABLET | Freq: Every day | ORAL | Status: DC
  Administered 2021-06-22: 100 mg via ORAL
  Filled 2021-06-22 (×3): qty 1

## 2021-06-22 NOTE — Group Note (Signed)
Recreation Therapy Group Note ? ? ?Group Topic:Stress Management  ?Group Date: 06/22/2021 ?Start Time: 0930 ?End Time: 5974 ?Facilitators: Victorino Sparrow, LRT,CTRS ?Location: [place] ? ? ?Goal Area(s) Addresses:  ?Patient will actively participate in stress management techniques presented during session.  ?Patient will successfully identify benefit of practicing stress management post d/c.  ?  ?Group Description: Guided Imagery. LRT provided education, instruction, and demonstration on practice of visualization via guided imagery. Patient was asked to participate in the technique introduced during session. LRT debriefed including topics of mindfulness, stress management and specific scenarios each patient could use these techniques. Patients were given suggestions of ways to access scripts post d/c and encouraged to explore Youtube and other apps available on smartphones, tablets, and computers. ? ? ?Affect/Mood: N/A ?  ?Participation Level: Did not attend ?  ? ?Clinical Observations/Individualized Feedback:   ? ? ?Plan: Continue to engage patient in RT group sessions 2-3x/week. ? ? ?Victorino Sparrow, LRT,CTRS ?06/22/2021 12:09 PM ?

## 2021-06-22 NOTE — Group Note (Unsigned)
Date:  06/22/2021 ?Time:  10:01 AM ? ?Group Topic/Focus:  ?Orientation:   The focus of this group is to educate the patient on the purpose and policies of crisis stabilization and provide a format to answer questions about their admission.  The group details unit policies and expectations of patients while admitted. ? ? ? ? ?Participation Level:  {BHH PARTICIPATION LEVEL:22264} ? ?Participation Quality:  {BHH PARTICIPATION QUALITY:22265} ? ?Affect:  {BHH AFFECT:22266} ? ?Cognitive:  {BHH COGNITIVE:22267} ? ?Insight: {BHH Insight2:20797} ? ?Engagement in Group:  {BHH ENGAGEMENT IN MBBUY:37096} ? ?Modes of Intervention:  {BHH MODES OF INTERVENTION:22269} ? ?Additional Comments:  *** ? ?Garvin Fila ?06/22/2021, 10:01 AM ? ?

## 2021-06-22 NOTE — BH IP Treatment Plan (Signed)
Interdisciplinary Treatment and Diagnostic Plan Update ? ?06/22/2021 ?Time of Session: 10:15am  ?Casey Silva ?MRN: [id number] ? ?Principal Diagnosis: MDD (major depressive disorder), recurrent, severe, with psychosis ([place]) ? ?Secondary Diagnoses: Principal Problem: ?  MDD (major depressive disorder), recurrent, severe, with psychosis ([place]) ?Active Problems: ?  GAD (generalized anxiety disorder) ?  Tobacco user ?  Vitamin D deficiency ? ? ?Current Medications:  ?Current Facility-Administered Medications  ?Medication Dose Route Frequency Provider Last Rate Last Admin  ? acetaminophen (TYLENOL) tablet 650 mg  650 mg Oral Q6H PRN Suella Broad, FNP   650 mg at 06/22/21 0747  ? alum & mag hydroxide-simeth (MAALOX/MYLANTA) 200-200-20 MG/5ML suspension 30 mL  30 mL Oral Q4H PRN Starkes-Perry, Gayland Curry, FNP      ? amLODipine (NORVASC) tablet 10 mg  10 mg Oral Daily Nicholes Rough, NP   10 mg at 06/22/21 0743  ? diclofenac Sodium (VOLTAREN) 1 % topical gel 2 g  2 g Topical QID Damita Dunnings B, MD   2 g at 06/12/21 1314  ? feeding supplement (ENSURE ENLIVE / ENSURE PLUS) liquid 237 mL  237 mL Oral BID BM Nkwenti, Doris, NP   237 mL at 06/22/21 1325  ? gabapentin (NEURONTIN) capsule 400 mg  400 mg Oral TID Nicholes Rough, NP      ? influenza vac split quadrivalent PF (FLUARIX) injection 0.5 mL  0.5 mL Intramuscular Tomorrow-1000 Nicholes Rough, NP      ? LORazepam (ATIVAN) tablet 0.5 mg  0.5 mg Oral BID Nicholes Rough, NP   0.5 mg at 06/22/21 1325  ? magnesium hydroxide (MILK OF MAGNESIA) suspension 30 mL  30 mL Oral Daily PRN Suella Broad, FNP   30 mL at 06/09/21 1611  ? melatonin tablet 5 mg  5 mg Oral QHS Suella Broad, FNP   5 mg at 06/21/21 2134  ? mirtazapine (REMERON) tablet 30 mg  30 mg Oral QHS Suella Broad, FNP   30 mg at 06/21/21 2135  ? multivitamin with minerals tablet 1 tablet  1 tablet Oral Daily Harlow Asa, MD   1 tablet at 06/22/21 0744  ? OLANZapine (ZYPREXA) tablet  2.5 mg  2.5 mg Oral Once Nicholes Rough, NP      ? [START ON 06/23/2021] OLANZapine (ZYPREXA) tablet 2.5 mg  2.5 mg Oral Once Nicholes Rough, NP      ? ondansetron (ZOFRAN-ODT) disintegrating tablet 4 mg  4 mg Oral Q4H PRN Nicholes Rough, NP   4 mg at 06/21/21 1133  ? propranolol (INDERAL) tablet 10 mg  10 mg Oral TID Nicholes Rough, NP   10 mg at 06/22/21 1331  ? QUEtiapine (SEROQUEL) tablet 100 mg  100 mg Oral QHS Nkwenti, Doris, NP      ? QUEtiapine (SEROQUEL) tablet 25 mg  25 mg Oral BID Nicholes Rough, NP   25 mg at 06/22/21 1331  ? traZODone (DESYREL) tablet 100 mg  100 mg Oral QHS Nicholes Rough, NP   100 mg at 06/21/21 2136  ? venlafaxine XR (EFFEXOR-XR) 24 hr capsule 112.5 mg  112.5 mg Oral Q breakfast Hill, Jackie Plum, MD   112.5 mg at 06/22/21 0745  ? Vitamin D (Ergocalciferol) (DRISDOL) capsule 50,000 Units  50,000 Units Oral Q7 days Harlow Asa, MD   50,000 Units at 06/18/21 1650  ? ?PTA Medications: ?Medications Prior to Admission  ?Medication Sig Dispense Refill Last Dose  ? acetaminophen (TYLENOL) 500 MG tablet Take 1,000 mg by mouth every 6 (  six) hours as needed for mild pain or headache.     ? amLODipine (NORVASC) 5 MG tablet Take 1 tablet (5 mg total) by mouth daily. For high blood pressure (Patient not taking: Reported on 06/07/2021) 30 tablet 0   ? docusate sodium (COLACE) 100 MG capsule Take 1 capsule (100 mg total) by mouth daily. (May buy from over the counter): For constipation (Patient taking differently: Take 100 mg by mouth daily as needed for mild constipation. (May buy from over the counter): For constipation) 1 capsule 0   ? hydrOXYzine (ATARAX) 50 MG tablet Take 1 tablet (50 mg total) by mouth every 6 (six) hours as needed for anxiety. (Patient taking differently: Take 50-150 mg by mouth every 6 (six) hours as needed for anxiety.) 90 tablet 3   ? melatonin 5 MG TABS Take 1 tablet (5 mg total) by mouth at bedtime. For depression (Patient taking differently: Take 10 mg by mouth  at bedtime. For depression) 30 tablet 3   ? mirtazapine (REMERON) 30 MG tablet Take 1 tablet (30 mg total) by mouth at bedtime. 30 tablet 3   ? polyethylene glycol (MIRALAX / GLYCOLAX) 17 g packet Take 17 g by mouth daily. (May buy from over the counter): For constipation (Patient taking differently: Take 17 g by mouth daily as needed for mild constipation. (May buy from over the counter): For constipation) 1 each 0   ? risperiDONE (RISPERDAL) 3 MG tablet Take 1 tablet (3 mg total) by mouth at bedtime. For mood control 30 tablet 3   ? traZODone (DESYREL) 100 MG tablet Take 2 tablets (200 mg total) by mouth at bedtime. For sleep 60 tablet 3   ? ? ?Patient Stressors: Health problems   ?Medication change or noncompliance   ?Traumatic event   ? ?Patient Strengths: Ability for insight  ?Communication skills  ? ?Treatment Modalities: Medication Management, Group therapy, Case management,  ?1 to 1 session with clinician, Psychoeducation, Recreational therapy. ? ? ?Physician Treatment Plan for Primary Diagnosis: MDD (major depressive disorder), recurrent, severe, with psychosis ([place]) ?Long Term Goal(s): Improvement in symptoms so as ready for discharge  ? ?Short Term Goals: Ability to identify changes in lifestyle to reduce recurrence of condition will improve ?Ability to disclose and discuss suicidal ideas ?Ability to demonstrate self-control will improve ?Ability to identify and develop effective coping behaviors will improve ?Ability to maintain clinical measurements within normal limits will improve ?Compliance with prescribed medications will improve ?Ability to identify triggers associated with substance abuse/mental health issues will improve ? ?Medication Management: Evaluate patient's response, side effects, and tolerance of medication regimen. ? ?Therapeutic Interventions: 1 to 1 sessions, Unit Group sessions and Medication administration. ? ?Evaluation of Outcomes: Progressing ? ?Physician Treatment Plan for  Secondary Diagnosis: Principal Problem: ?  MDD (major depressive disorder), recurrent, severe, with psychosis ([place]) ?Active Problems: ?  GAD (generalized anxiety disorder) ?  Tobacco user ?  Vitamin D deficiency ? ?Long Term Goal(s): Improvement in symptoms so as ready for discharge  ? ?Short Term Goals: Ability to identify changes in lifestyle to reduce recurrence of condition will improve ?Ability to disclose and discuss suicidal ideas ?Ability to demonstrate self-control will improve ?Ability to identify and develop effective coping behaviors will improve ?Ability to maintain clinical measurements within normal limits will improve ?Compliance with prescribed medications will improve ?Ability to identify triggers associated with substance abuse/mental health issues will improve    ? ?Medication Management: Evaluate patient's response, side effects, and tolerance of medication regimen. ? ?  Therapeutic Interventions: 1 to 1 sessions, Unit Group sessions and Medication administration. ? ?Evaluation of Outcomes: Progressing ? ? ?RN Treatment Plan for Primary Diagnosis: MDD (major depressive disorder), recurrent, severe, with psychosis ([place]) ?Long Term Goal(s): Knowledge of disease and therapeutic regimen to maintain health will improve ? ?Short Term Goals: Ability to remain free from injury will improve, Ability to participate in decision making will improve, Ability to verbalize feelings will improve, Ability to disclose and discuss suicidal ideas, and Ability to identify and develop effective coping behaviors will improve ? ?Medication Management: RN will administer medications as ordered by provider, will assess and evaluate patient's response and provide education to patient for prescribed medication. RN will report any adverse and/or side effects to prescribing provider. ? ?Therapeutic Interventions: 1 on 1 counseling sessions, Psychoeducation, Medication administration, Evaluate responses to treatment, Monitor  vital signs and CBGs as ordered, Perform/monitor CIWA, COWS, AIMS and Fall Risk screenings as ordered, Perform wound care treatments as ordered. ? ?Evaluation of Outcomes: Progressing ? ? ?LCSW Treatment Plan

## 2021-06-22 NOTE — Progress Notes (Addendum)
D: Patient states he did not feel so well today; he had a bout of nausea/diarrhea early this am. He states he feels better this morning. Patient is able to tolerate his medications and some fluids. Patient took his seroquel and ativan earlier with no issues. He was pleased to get some medication for his severe anxiety. Patient rates his anxiety and depression as a 7; he rates his hopelessness as an 8. He continues to have passive SI with no specific plan. His goal today is to "get better." It should be noted patient's hygiene has improved since admission. ? ?A: Continue to monitor medication management and MD orders.  Safety checks completed every 15 minutes per protocol.  Offer support and encouragement as needed. ? ?R: Patient is receptive to staff; his behavior is appropriate.   ? ? 06/22/21 1000  ?Psych Admission Type (Psych Patients Only)  ?Admission Status Voluntary  ?Psychosocial Assessment  ?Patient Complaints Other (Comment) ?(not feeling well)  ?Eye Contact Avoids  ?Facial Expression Animated  ?Affect Appropriate to circumstance  ?Speech Logical/coherent  ?Interaction Cautious  ?Motor Activity Shuffling;Slow  ?Appearance/Hygiene Improved  ?Behavior Characteristics Cooperative  ?Mood Helpless  ?Thought Process  ?Coherency WDL  ?Content WDL  ?Delusions None reported or observed  ?Perception WDL  ?Hallucination None reported or observed  ?Judgment Poor  ?Confusion None  ?Danger to Self  ?Current suicidal ideation? Denies  ?Self-Injurious Behavior No self-injurious ideation or behavior indicators observed or expressed   ?Agreement Not to Harm Self Yes  ?Description of Agreement verbal  ?Danger to Others  ?Danger to Others None reported or observed  ? ? ?

## 2021-06-22 NOTE — Progress Notes (Addendum)
[facility] MD Progress Note ? ?06/22/2021 2:12 PM ?Casey Silva  ?MRN:  [id number] ? ?Subjective:  " I My appetite is poor. I still have nausea and diarrhea, and I am still anxious."  ? ?Today's assessment (06/22/2021): Pt's chart is reviewed, his case discussed with treatment team. Pt with flat affect and depressed mood, attention to personal hygiene and grooming is fair, eye contact is good, speech is clear & coherent. Thought contents are organized and logical, and pt currently endorses +SI, denies having a plan, but states that he has a knife at home. He denies HI/VH or paranoia. Pt reports auditory hallucinations of noises earlier in the morning. There is no evidence of delusional thoughts.  Pt denies being in any physical pain/distress. Pt continues to report having nausea, states that he had two bouts of diarrhea last night, and one bout this morning. Pt  educated to let her RN or this Probation officer know if he has more diarrhea.  ? ?Pt reports that his sleep quality last night was poor, he reports a poor appetite due to still feeling nauseous. Pt continues to be very restless, is unable to stay still during this encounter. He sits, stands, then sits, tries to make his bed, takes the linens and then places them back on the bed, and paces some more. Pt states that he is unable to control this, and states that it is related to him feeling anxious. Pt's movements seem to be akathisia secondary to antipsychotics. Case discussed with attending Physician, and Zyprexa will be tapered off, Seroquel will be started for MDD. Ativan will be started at 0.5 mg BID to help with restlessness & anxiety. Will also increase Gabapentin to 400 mg TID for management of depression. Will continue Propranolol 10 mg TID for anxiety as well. Rationales, benefits and possible side effects of medications explained to pt who is agreeable to this plan. We continue to wait for bed availability at [facility] so pt can be transferred to their  geriatric psych unit for ECT treatments. He verbalizes understanding of the procedure and is willing to try it. ? ?HPI: As per HPI from pt's initial presentation to the [facility]: "Casey Silva is a 66 y.o. male with history of anxiety, depression, and bipolar 1 disorder who presents to the emergency department with complaints of anxiety, depression, and suicidal ideation.  Patient states he ran out of his psychiatric medicines 3 days ago which is led to a significant decline in his mental health.  He states he feels very anxious.  He states he just does not want to deal with everything anymore." ? ?Labs reviewed: Na+level on admission was 130, sodium from 06/19/21 is 135-WNL, reviewed. LDL-123, EKG from 2/28 with prolonged QTC at 504, repeated EKG-NSR and QTC of 463 ? ?Principal Problem: MDD (major depressive disorder), recurrent, severe, with psychosis ([place]) ? ?Diagnosis: Principal Problem: ?  MDD (major depressive disorder), recurrent, severe, with psychosis ([place]) ?Active Problems: ?  GAD (generalized anxiety disorder) ?  Tobacco user ?  Vitamin D deficiency ? ?Total Time spent with patient: 20 minutes ? ?Past Psychiatric History:  MDD & anxiety ? ?Past Medical History:  ?Past Medical History:  ?Diagnosis Date  ? Anxiety   ? Asthma   ? Depression   ? History reviewed. No pertinent surgical history. ?Family History:  ?Family History  ?Problem Relation Age of Onset  ? Bipolar disorder Mother   ? ?Family Psychiatric  History:  ? Bipolar disorder Mother    ?  ?Social  History:  ?Social History  ? ?Substance and Sexual Activity  ?Alcohol Use No  ?   ?Social History  ? ?Substance and Sexual Activity  ?Drug Use No  ?  ?Social History  ? ?Socioeconomic History  ? Marital status: Single  ?  Spouse name: Not on file  ? Number of children: 1  ? Years of education: Not on file  ? Highest education level: GED or equivalent  ?Occupational History  ? Not on file  ?Tobacco Use  ? Smoking status: Never  ? Smokeless tobacco: Never   ?Vaping Use  ? Vaping Use: Unknown  ?Substance and Sexual Activity  ? Alcohol use: No  ? Drug use: No  ? Sexual activity: Not Currently  ?Other Topics Concern  ? Not on file  ?Social History Narrative  ? Marital status:  Single   Children: Daughter   Employment:  Unemployment. previous work for [employer].   Alcohol:  None   Drugs:  none   Exercise:  Regularly very   Religion:  Jewish  ? ?Social Determinants of Health  ? ?Financial Resource Strain: Not on file  ?Food Insecurity: Not on file  ?Transportation Needs: Not on file  ?Physical Activity: Not on file  ?Stress: Not on file  ?Social Connections: Not on file  ? ?Additional Social History:  ?  ?Sleep: Good ? ?Appetite:  Good ? ?Current Medications: ?Current Facility-Administered Medications  ?Medication Dose Route Frequency Provider Last Rate Last Admin  ? acetaminophen (TYLENOL) tablet 650 mg  650 mg Oral Q6H PRN Suella Broad, FNP   650 mg at 06/22/21 0747  ? alum & mag hydroxide-simeth (MAALOX/MYLANTA) 200-200-20 MG/5ML suspension 30 mL  30 mL Oral Q4H PRN Starkes-Perry, Gayland Curry, FNP      ? amLODipine (NORVASC) tablet 10 mg  10 mg Oral Daily Nicholes Rough, NP   10 mg at 06/22/21 0743  ? diclofenac Sodium (VOLTAREN) 1 % topical gel 2 g  2 g Topical QID Damita Dunnings B, MD   2 g at 06/12/21 1314  ? feeding supplement (ENSURE ENLIVE / ENSURE PLUS) liquid 237 mL  237 mL Oral BID BM Nkwenti, Doris, NP   237 mL at 06/22/21 1325  ? gabapentin (NEURONTIN) capsule 400 mg  400 mg Oral TID Nicholes Rough, NP      ? influenza vac split quadrivalent PF (FLUARIX) injection 0.5 mL  0.5 mL Intramuscular Tomorrow-1000 Nicholes Rough, NP      ? LORazepam (ATIVAN) tablet 0.5 mg  0.5 mg Oral BID Nicholes Rough, NP   0.5 mg at 06/22/21 1325  ? magnesium hydroxide (MILK OF MAGNESIA) suspension 30 mL  30 mL Oral Daily PRN Suella Broad, FNP   30 mL at 06/09/21 1611  ? melatonin tablet 5 mg  5 mg Oral QHS Suella Broad, FNP   5 mg at 06/21/21 2134   ? mirtazapine (REMERON) tablet 30 mg  30 mg Oral QHS Suella Broad, FNP   30 mg at 06/21/21 2135  ? multivitamin with minerals tablet 1 tablet  1 tablet Oral Daily Harlow Asa, MD   1 tablet at 06/22/21 0744  ? OLANZapine (ZYPREXA) tablet 2.5 mg  2.5 mg Oral Once Nicholes Rough, NP      ? [START ON 06/23/2021] OLANZapine (ZYPREXA) tablet 2.5 mg  2.5 mg Oral Once Nicholes Rough, NP      ? ondansetron (ZOFRAN-ODT) disintegrating tablet 4 mg  4 mg Oral Q4H PRN Nicholes Rough, NP  4 mg at 06/21/21 1133  ? propranolol (INDERAL) tablet 10 mg  10 mg Oral TID Nicholes Rough, NP   10 mg at 06/22/21 1331  ? QUEtiapine (SEROQUEL) tablet 100 mg  100 mg Oral QHS Nkwenti, Doris, NP      ? QUEtiapine (SEROQUEL) tablet 25 mg  25 mg Oral BID Nicholes Rough, NP   25 mg at 06/22/21 1331  ? traZODone (DESYREL) tablet 100 mg  100 mg Oral QHS Nicholes Rough, NP   100 mg at 06/21/21 2136  ? venlafaxine XR (EFFEXOR-XR) 24 hr capsule 112.5 mg  112.5 mg Oral Q breakfast Hill, Jackie Plum, MD   112.5 mg at 06/22/21 0745  ? Vitamin D (Ergocalciferol) (DRISDOL) capsule 50,000 Units  50,000 Units Oral Q7 days Harlow Asa, MD   50,000 Units at 06/18/21 1650  ? ?Lab Results:  ?Results for orders placed or performed during the hospital encounter of 06/07/21 (from the past 48 hour(s))  ?Resp Panel by RT-PCR (Flu A&B, Covid) Nasopharyngeal Swab     Status: None  ? Collection Time: 06/21/21 11:59 AM  ? Specimen: Nasopharyngeal Swab; Nasopharyngeal(NP) swabs in vial transport medium  ?Result Value Ref Range  ? SARS Coronavirus 2 by RT PCR NEGATIVE NEGATIVE  ?  Comment: (NOTE) ?SARS-CoV-2 target nucleic acids are NOT DETECTED. ? ?The SARS-CoV-2 RNA is generally detectable in upper respiratory ?specimens during the acute phase of infection. The lowest ?concentration of SARS-CoV-2 viral copies this assay can detect is ?138 copies/mL. A negative result does not preclude SARS-Cov-2 ?infection and should not be used as the sole basis  for treatment or ?other patient management decisions. A negative result may occur with  ?improper specimen collection/handling, submission of specimen other ?than nasopharyngeal swab, presence of viral mutat

## 2021-06-22 NOTE — Group Note (Unsigned)
Date:  06/22/2021 ?Time:  10:23 AM ? ?Group Topic/Focus:  ?Orientation:   The focus of this group is to educate the patient on the purpose and policies of crisis stabilization and provide a format to answer questions about their admission.  The group details unit policies and expectations of patients while admitted. ? ? ? ? ?Participation Level:  {BHH PARTICIPATION LEVEL:22264} ? ?Participation Quality:  {[facility] PARTICIPATION QUALITY:22265} ? ?Affect:  {BHH AFFECT:22266} ? ?Cognitive:  {BHH COGNITIVE:22267} ? ?Insight: {BHH Insight2:20797} ? ?Engagement in Group:  {BHH ENGAGEMENT IN FGBMS:11155} ? ?Modes of Intervention:  {BHH MODES OF INTERVENTION:22269} ? ?Additional Comments:  *** ? ?Casey Silva ?06/22/2021, 10:23 AM ? ?

## 2021-06-22 NOTE — Group Note (Signed)
LCSW Group Therapy Note ? ?Group Date: 06/22/2021 ?Start Time: 1300 ?End Time: 1400 ? ? ?Type of Therapy and Topic:  Group Therapy: Anger Cues and Responses ? ?Participation Level:  Did Not Attend ? ? ?Description of Group:   ?In this group, patients learned how to recognize the physical, cognitive, emotional, and behavioral responses they have to anger-provoking situations.  They identified a recent time they became angry and how they reacted.  They analyzed how their reaction was possibly beneficial and how it was possibly unhelpful.  The group discussed a variety of healthier coping skills that could help with such a situation in the future.  Focus was placed on how helpful it is to recognize the underlying emotions to our anger, because working on those can lead to a more permanent solution as well as our ability to focus on the important rather than the urgent. ? ?Therapeutic Goals: ?Patients will remember their last incident of anger and how they felt emotionally and physically, what their thoughts were at the time, and how they behaved. ?Patients will identify how their behavior at that time worked for them, as well as how it worked against them. ?Patients will explore possible new behaviors to use in future anger situations. ?Patients will learn that anger itself is normal and cannot be eliminated, and that healthier reactions can assist with resolving conflict rather than worsening situations. ? ?Summary of Patient Progress:  Did not attend  ? ?Therapeutic Modalities:   ?Cognitive Behavioral Therapy ? ? ?Darleen Crocker, LCSWA ?06/22/2021  1:59 PM   ? ?

## 2021-06-23 ENCOUNTER — Encounter: Payer: Self-pay | Admitting: Psychiatry

## 2021-06-23 ENCOUNTER — Other Ambulatory Visit: Payer: Self-pay

## 2021-06-23 ENCOUNTER — Inpatient Hospital Stay
Admission: AD | Admit: 2021-06-23 | Discharge: 2021-08-03 | DRG: 885 | Disposition: A | Discharge status: Home / Self Care | Payer: 59 | Source: Intra-hospital | Attending: Psychiatry | Admitting: Psychiatry

## 2021-06-23 DIAGNOSIS — Z9151 Personal history of suicidal behavior: Secondary | ICD-10-CM

## 2021-06-23 DIAGNOSIS — I10 Essential (primary) hypertension: Secondary | ICD-10-CM | POA: Diagnosis present

## 2021-06-23 DIAGNOSIS — R413 Other amnesia: Secondary | ICD-10-CM | POA: Diagnosis present

## 2021-06-23 DIAGNOSIS — R45851 Suicidal ideations: Secondary | ICD-10-CM | POA: Diagnosis present

## 2021-06-23 DIAGNOSIS — Z91048 Other nonmedicinal substance allergy status: Secondary | ICD-10-CM

## 2021-06-23 DIAGNOSIS — Z79899 Other long term (current) drug therapy: Secondary | ICD-10-CM

## 2021-06-23 DIAGNOSIS — F5101 Primary insomnia: Secondary | ICD-10-CM | POA: Diagnosis present

## 2021-06-23 DIAGNOSIS — R569 Unspecified convulsions: Secondary | ICD-10-CM | POA: Diagnosis present

## 2021-06-23 DIAGNOSIS — J45909 Unspecified asthma, uncomplicated: Secondary | ICD-10-CM | POA: Diagnosis present

## 2021-06-23 DIAGNOSIS — F411 Generalized anxiety disorder: Secondary | ICD-10-CM | POA: Diagnosis present

## 2021-06-23 DIAGNOSIS — F333 Major depressive disorder, recurrent, severe with psychotic symptoms: Principal | ICD-10-CM | POA: Diagnosis present

## 2021-06-23 DIAGNOSIS — Z818 Family history of other mental and behavioral disorders: Secondary | ICD-10-CM | POA: Diagnosis not present

## 2021-06-23 DIAGNOSIS — R197 Diarrhea, unspecified: Secondary | ICD-10-CM | POA: Diagnosis present

## 2021-06-23 DIAGNOSIS — Z20822 Contact with and (suspected) exposure to covid-19: Secondary | ICD-10-CM | POA: Diagnosis present

## 2021-06-23 LAB — BASIC METABOLIC PANEL
Anion gap: 6 (ref 5–15)
BUN: 14 mg/dL (ref 8–23)
CO2: 28 mmol/L (ref 22–32)
Calcium: 9.1 mg/dL (ref 8.9–10.3)
Chloride: 103 mmol/L (ref 98–111)
Creatinine, Ser: 0.85 mg/dL (ref 0.61–1.24)
GFR, Estimated: >60 mL/min (ref >60)
Glucose, Bld: 85 mg/dL (ref 70–99)
Potassium: 4 mmol/L (ref 3.5–5.1)
Sodium: 137 mmol/L (ref 135–145)

## 2021-06-23 LAB — RESP PANEL BY RT-PCR (FLU A&B, COVID) ARPGX2
Influenza A by PCR: NEGATIVE
Influenza B by PCR: NEGATIVE
SARS Coronavirus 2 by RT PCR: NEGATIVE

## 2021-06-23 MED ORDER — QUETIAPINE FUMARATE 100 MG PO TABS: 100.0000 mg | ORAL_TABLET | Freq: Every day | ORAL | 0 refills | Status: DC

## 2021-06-23 MED ORDER — TRAZODONE HCL 100 MG PO TABS
100.0000 mg | ORAL_TABLET | Freq: Every day | ORAL | Status: DC
  Administered 2021-06-23 – 2021-07-13 (×19): 100 mg via ORAL
  Filled 2021-06-23 (×20): qty 1

## 2021-06-23 MED ORDER — PROPRANOLOL HCL 20 MG PO TABS
10.0000 mg | ORAL_TABLET | Freq: Three times a day (TID) | ORAL | Status: DC
  Administered 2021-06-23 – 2021-07-14 (×51): 10 mg via ORAL
  Filled 2021-06-23 (×55): qty 1

## 2021-06-23 MED ORDER — ADULT MULTIVITAMIN W/MINERALS CH
1.0000 | ORAL_TABLET | Freq: Every day | ORAL | Status: DC
  Administered 2021-06-24 – 2021-08-03 (×37): 1 via ORAL
  Filled 2021-06-23 (×39): qty 1

## 2021-06-23 MED ORDER — QUETIAPINE FUMARATE 25 MG PO TABS
25.0000 mg | ORAL_TABLET | Freq: Two times a day (BID) | ORAL | 0 refills | Status: DC
Start: 2021-06-23 — End: 2021-08-03

## 2021-06-23 MED ORDER — GABAPENTIN 400 MG PO CAPS
400.0000 mg | ORAL_CAPSULE | Freq: Three times a day (TID) | ORAL | Status: DC
  Administered 2021-06-24 – 2021-07-14 (×58): 400 mg via ORAL
  Filled 2021-06-23 (×59): qty 1

## 2021-06-23 MED ORDER — LORAZEPAM 0.5 MG PO TABS: 0.5000 mg | ORAL_TABLET | Freq: Two times a day (BID) | ORAL | 0 refills | Status: DC | PRN

## 2021-06-23 MED ORDER — VITAMIN D (ERGOCALCIFEROL) 1.25 MG (50000 UNIT) PO CAPS: 50000.0000 [IU] | ORAL_CAPSULE | ORAL | 0 refills | Status: DC

## 2021-06-23 MED ORDER — MELATONIN 5 MG PO TABS
5.0000 mg | ORAL_TABLET | Freq: Every day | ORAL | 0 refills | Status: DC
  Filled 2021-06-23: qty 30, 30d supply, fill #0

## 2021-06-23 MED ORDER — MIRTAZAPINE 15 MG PO TABS
30.0000 mg | ORAL_TABLET | Freq: Every day | ORAL | Status: DC
Start: 2021-06-23 — End: 2021-07-05
  Administered 2021-06-23 – 2021-07-04 (×11): 30 mg via ORAL
  Filled 2021-06-23 (×12): qty 2

## 2021-06-23 MED ORDER — PROPRANOLOL HCL 10 MG PO TABS: 10.0000 mg | ORAL_TABLET | Freq: Three times a day (TID) | ORAL | 0 refills | Status: DC

## 2021-06-23 MED ORDER — ENSURE ENLIVE PO LIQD
237.0000 mL | Freq: Two times a day (BID) | ORAL | Status: DC
  Administered 2021-06-24 – 2021-08-02 (×66): 237 mL via ORAL

## 2021-06-23 MED ORDER — ONDANSETRON 4 MG PO TBDP: 4.0000 mg | ORAL_TABLET | ORAL | Status: DC | PRN

## 2021-06-23 MED ORDER — DICLOFENAC SODIUM 1 % EX GEL
2.0000 g | Freq: Four times a day (QID) | CUTANEOUS | Status: DC
  Administered 2021-06-27 – 2021-07-18 (×7): 2 g via TOPICAL
  Filled 2021-06-23: qty 100

## 2021-06-23 MED ORDER — MELATONIN 5 MG PO TABS
5.0000 mg | ORAL_TABLET | Freq: Every day | ORAL | Status: DC
  Administered 2021-06-23 – 2021-08-02 (×40): 5 mg via ORAL
  Filled 2021-06-23 (×40): qty 1

## 2021-06-23 MED ORDER — QUETIAPINE FUMARATE 100 MG PO TABS
100.0000 mg | ORAL_TABLET | Freq: Every day | ORAL | Status: DC
  Administered 2021-06-23 – 2021-07-23 (×30): 100 mg via ORAL
  Filled 2021-06-23 (×31): qty 1

## 2021-06-23 MED ORDER — GABAPENTIN 400 MG PO CAPS: 400.0000 mg | ORAL_CAPSULE | Freq: Three times a day (TID) | ORAL | 0 refills | Status: DC

## 2021-06-23 MED ORDER — VENLAFAXINE HCL ER 37.5 MG PO CP24: 112.5000 mg | ORAL_CAPSULE | Freq: Every day | ORAL | 0 refills | Status: DC

## 2021-06-23 MED ORDER — VENLAFAXINE HCL ER 75 MG PO CP24
112.5000 mg | ORAL_CAPSULE | Freq: Every day | ORAL | Status: DC
  Administered 2021-06-24 – 2021-07-05 (×12): 112.5 mg via ORAL
  Filled 2021-06-23 (×12): qty 1

## 2021-06-23 MED ORDER — QUETIAPINE FUMARATE 25 MG PO TABS
25.0000 mg | ORAL_TABLET | Freq: Two times a day (BID) | ORAL | Status: DC
  Administered 2021-06-24 – 2021-07-05 (×23): 25 mg via ORAL
  Filled 2021-06-23 (×24): qty 1

## 2021-06-23 MED ORDER — TRAZODONE HCL 100 MG PO TABS
100.0000 mg | ORAL_TABLET | Freq: Every day | ORAL | 0 refills | Status: DC
  Filled 2021-06-23: qty 30, 30d supply, fill #0

## 2021-06-23 MED ORDER — TRAZODONE HCL 100 MG PO TABS
100.0000 mg | ORAL_TABLET | Freq: Every evening | ORAL | Status: DC | PRN
  Administered 2021-06-25 – 2021-08-02 (×17): 100 mg via ORAL
  Filled 2021-06-23 (×17): qty 1

## 2021-06-23 MED ORDER — HYDROXYZINE HCL 50 MG PO TABS
50.0000 mg | ORAL_TABLET | Freq: Four times a day (QID) | ORAL | Status: DC | PRN
  Administered 2021-06-23 – 2021-08-01 (×11): 50 mg via ORAL
  Filled 2021-06-23 (×10): qty 1

## 2021-06-23 MED ORDER — AMLODIPINE BESYLATE 10 MG PO TABS
10.0000 mg | ORAL_TABLET | Freq: Every day | ORAL | 0 refills | Status: DC
  Filled 2021-06-23: qty 30, 30d supply, fill #0

## 2021-06-23 MED ORDER — AMLODIPINE BESYLATE 5 MG PO TABS
10.0000 mg | ORAL_TABLET | Freq: Every day | ORAL | Status: DC
  Administered 2021-06-24 – 2021-08-03 (×40): 10 mg via ORAL
  Filled 2021-06-23 (×40): qty 2

## 2021-06-23 MED ORDER — VITAMIN D (ERGOCALCIFEROL) 1.25 MG (50000 UNIT) PO CAPS
50000.0000 [IU] | ORAL_CAPSULE | ORAL | Status: DC
  Administered 2021-06-25 – 2021-07-30 (×6): 50000 [IU] via ORAL
  Filled 2021-06-23 (×7): qty 1

## 2021-06-23 NOTE — Discharge Summary (Addendum)
Physician Discharge Summary Note ? ?Patient:  Casey Silva is an 66 y.o., male ?MRN:  [id number] ?DOB:  June 21, 1955 ?Patient phone:  [phone] (home)  ?Patient address:   ?[address] ?[address] ?[place] [address],  ? ?Total Time spent with patient: 45 minutes ? ?Date of Admission:  06/07/2021 ?Date of Discharge:   06/23/2021 ? ?Reason for Admission:  MDD (major depressive disorder), recurrent, severe, with psychosis ([place]) ?Principal Problem: ?  MDD (major depressive disorder), recurrent, severe, with psychosis ([place]) ?Active Problems: ?  Primary insomnia ?  Suicide ideation ?  GAD (generalized anxiety disorder) ?  Benign essential hypertension ?  Vitamin D deficiency ? ?Principal Problem: MDD (major depressive disorder), recurrent, severe, with psychosis ([place]) ? ?Discharge Diagnoses: Principal Problem: ?  MDD (major depressive disorder), recurrent, severe, with psychosis ([place]) ?Active Problems: ?  Primary insomnia ?  Suicide ideation ?  GAD (generalized anxiety disorder) ?  Benign essential hypertension ?  Vitamin D deficiency ? ?Past Psychiatric History: MDD & anxiety ?  ?Past Medical History:  ?Past Medical History:  ?Diagnosis Date  ? Anxiety   ? Asthma   ? Depression   ? History reviewed. No pertinent surgical history. ?Family History:  ?Family History  ?Problem Relation Age of Onset  ? Bipolar disorder Mother   ? ?Family Psychiatric  History:  ?Problem Relation Age of Onset  ? Bipolar disorder Mother    ?  ?Social History:  ?Social History  ? ?Substance and Sexual Activity  ?Alcohol Use No  ?   ?Social History  ? ?Substance and Sexual Activity  ?Drug Use No  ?  ?Social History  ? ?Socioeconomic History  ? Marital status: Single  ?  Spouse name: Not on file  ? Number of children: 1  ? Years of education: Not on file  ? Highest education level: GED or equivalent  ?Occupational History  ? Not on file  ?Tobacco Use  ? Smoking status: Never  ? Smokeless tobacco: Never  ?Vaping Use  ? Vaping Use: Unknown   ?Substance and Sexual Activity  ? Alcohol use: No  ? Drug use: No  ? Sexual activity: Not Currently  ?Other Topics Concern  ? Not on file  ?Social History Narrative  ? Marital status:  Single   Children: Daughter   Employment:  Unemployment. previous work for [employer].   Alcohol:  None   Drugs:  none   Exercise:  Regularly very   Religion:  Jewish  ? ?Social Determinants of Health  ? ?Financial Resource Strain: Not on file  ?Food Insecurity: Not on file  ?Transportation Needs: Not on file  ?Physical Activity: Not on file  ?Stress: Not on file  ?Social Connections: Not on file  ? ? ?Hospital Course:  Hospital Course:   ?Patient was admitted to the Adult Unit at [facility] under the service of Dr.  ?Nelda Marseille. ? Routine labs reviewed, labs on 06/23/21: Iron / Anemia Profile: Normal, CMP: Normal. Glucose: Normal,  06/07/21 UDS: None Detected. He had hyponatremia that corrected during admission. He had initial prolonged QTC with corrected during admission.  ?Maintained Q 15 minutes observation for safety. During this admission patient did not require any change in his observation level. Length of stay was 16 days.  ?During this hospitalization the patient received psychosocial  assessment. ?Patient participated in groups and attended to ADLs with prompts. ?During this admission patient met with his psychiatrist daily and received full nursing service. ?An individualized treatment  plan according to the patient's age, level of functioning, diagnostic considerations and acute behavior was initiated.  ?To continue to reduce current symptoms to base line and improve the patient's overall level of functioning started Venlafaxine (Effexor) 112.5 mg po daily (titrated from 37.5 mg po daily  with first dose given 06/08/21). Continued Trazodone 100 mg daily at bedtime for sleep first dose on 06/08/21, Seroquel 100 mg po QHS, Seroquel 25 mg po BID for depression/anxiety/psychosis, Zyprexa 7.5 mg tapered  off due to akathisias, Remeron 30 mg po QHS continued, Lorazepam 0.5 mg po Bid first dose on 06/22/21 for anxiety and akathisias with goal of tapering off,  Neurontin tapered up to 400 mg po TID first dose on 06/22/21 for anxiety, Norvasc 10 mg po daily for HTN, Inderal 10 po TID for anxiety, first dose on 06/22/21. Vitamin D capsule 50000 units po every 7 days first dose on 06/11/21. Patient is tolerating medications well with no side effects at discharge. ?Patient was able to verbalize reasons for living and a safety plan was developed.  She appears to have a positive outlook and is agreeable to continued therapy and medication management. Safety plan was discussed with guardian who is in agreement. Patient was provided with the national suicide hotline # 1-800-273-TALK as well as [facility]  number. ?At discharge, patient is medically stable and basic physical exam is within normal limits with no abnormal findings.  Patient is transferred to [facility] for ECT Therapy. ?At discharge patient denied psychotic symptoms, homicidal ideation, intent or plan, and there was no evidence of manic episode. He had passive SI but could contract for safety and was in agreement with transfer to [facility] for ECT. His depressive symptoms and generalized anxiety symptoms continued and thus he is discharged to [facility] for Electroconvulsive Therapy. ? ?Physical Findings: ?AIMS: Facial and Oral Movements ?Muscles of Facial Expression: None, normal ?Lips and Perioral Area: None, normal ?Jaw: None, normal ?Tongue: None, normal,Extremity Movements ?Upper (arms, wrists, hands, fingers): None, normal ?Lower (legs, knees, ankles, toes): Mild, Trunk Movements ?Neck, shoulders, hips: None, normal, Overall Severity ?Severity of abnormal movements (highest score from questions above): Mild ?Incapacitation due to abnormal movements: Minimal ?Patient's awareness of abnormal movements (rate only patient's report):  Aware, mild distress, Dental Status ?Current problems with teeth and/or dentures?: No ?Does patient usually wear dentures?: No  ? ?Musculoskeletal: ?Strength & Muscle Tone: within normal limits ?Gait & Station: normal ?Patient leans: N/A ? ?Psychiatric Specialty Exam: ? ?Presentation  ?General Appearance: Appropriate for Environment; Bizarre; Disheveled; Fairly Groomed ? ?Eye Contact:Good ? ?Speech:Clear and Coherent; Normal Rate ? ?Speech Volume:Normal ? ?Handedness:Right ? ? ?Mood and Affect  ?Mood:Anxious; Depressed; Hopeless; Irritable ? ?Affect:Blunt; Depressed ? ?Thought Process  ?Thought Processes:Coherent; Linear ? ?Descriptions of Associations:Intact ? ?Orientation:Full (Time, Place and Person) ? ?Thought Content:Passive SI but denies HI; denies AVH, paranoia, delusions or ideas of reference ? ?History of Schizophrenia/Schizoaffective disorder:No ? ?Hallucinations:Hallucinations: None ? ?Ideas of Reference:None ? ?Suicidal Thoughts:Passive but contracts for safety  ? ?Homicidal Thoughts:Homicidal Thoughts: No ? ?Sensorium  ?Memory:Immediate Fair; Recent Fair; Remote Fair ? ?Judgment:Fair ? ?Insight:Fair ? ?Executive Functions  ?Concentration:Fair ? ?Attention Span:Good ? ?Recall:Good ? ?[place] ? ?Language:Good ? ?Psychomotor Activity  ?Psychomotor Activity: ? ?Assets  ?Assets:Communication Skills; Physical Health; Social Support ? ?Sleep  ?Sleep:Sleep: Good ?Number of Hours of Sleep: 6 ? ?Physical Exam: ?Physical Exam ?Vitals and nursing note reviewed.  ?Constitutional:   ?   Appearance: Normal appearance.  ?HENT:  ?  Head: Normocephalic and atraumatic.  ?   Right Ear: External ear normal.  ?   Left Ear: External ear normal.  ?   Nose: Nose normal.  ?   Mouth/Throat:  ?   Mouth: Mucous membranes are moist.  ?   Pharynx: Oropharynx is clear.  ?Eyes:  ?   Extraocular Movements: Extraocular movements intact.  ?   Conjunctiva/sclera: Conjunctivae normal.  ?   Pupils: Pupils are equal, round,  and reactive to light.  ?Cardiovascular:  ?   Rate and Rhythm: Normal rate.  ?   Pulses: Normal pulses.  ?Pulmonary:  ?   Effort: Pulmonary effort is normal.  ?Abdominal:  ?   General: There is no distension.  ?

## 2021-06-23 NOTE — Progress Notes (Signed)
[facility] LCSW Progress Note ?  ?06/23/2021    3:40 PM ? ?Casey Silva  ? ?[id number]  ? ?Type of Contact and Topic:  Care Coordination  ? ?CSW contacted land lord in patient's presence to inform him that the patient is still hospitalized and unable to pay rent at the moment. Casey Silva, [place] [phone], landlord acknowledged and simply asked patient to pay rent once discharged from the hospital, no further action.  ? ?  ?Signed:  ?Casey Silva, MSW, LCSWA, LCAS ?06/23/2021 3:40 PM ?  ?  ?

## 2021-06-23 NOTE — Progress Notes (Signed)
Patient ID: Casey Silva, male   DOB: [birth year], 66 y.o.   MRN: [id number] ? ? ?Safe Transport was called. ?

## 2021-06-23 NOTE — Plan of Care (Signed)
PT IS PROGRESSING. ?

## 2021-06-23 NOTE — Progress Notes (Signed)
?   06/23/21 1159  ?Psych Admission Type (Psych Patients Only)  ?Admission Status Voluntary  ?Psychosocial Assessment  ?Patient Complaints Anxiety;Depression  ?Eye Contact Fair  ?Facial Expression Anxious  ?Affect Appropriate to circumstance  ?Speech Logical/coherent;Soft  ?Interaction Minimal  ?Motor Activity Shuffling;Fidgety  ?Appearance/Hygiene In scrubs;Poor hygiene  ?Behavior Characteristics Cooperative  ?Mood Anxious;Depressed  ?Thought Process  ?Coherency WDL  ?Content WDL  ?Delusions None reported or observed  ?Perception WDL  ?Hallucination None reported or observed  ?Judgment Impaired  ?Confusion None  ?Danger to Self  ?Current suicidal ideation? Passive  ?Self-Injurious Behavior Some self-injurious ideation observed or expressed.  No lethal plan expressed   ?Agreement Not to Harm Self Yes  ?Description of Agreement verbally contracts for safety  ?Danger to Others  ?Danger to Others None reported or observed  ? ? ?

## 2021-06-23 NOTE — Progress Notes (Signed)
?  [facility] Adult Case Management Discharge Plan : ? ?Will you be returning to the same living situation after discharge:  No. Will be transferred to [facility] Unit ?At discharge, do you have transportation home?: No. Safe Transport will be arranged ?Do you have the ability to pay for your medications: No.  ? ?Release of information consent forms completed and in the chart;  Patient's signature needed at discharge. ? ?Patient to Follow up at: ? Follow-up Information   ? ? [facility] Follow up on 06/29/2021.   ?Specialty: Behavioral Health ?Why: You have an appointment for medication management services on 06/29/21 at 3:30 pm; (This will be a Virtual appointment)   For therapy services, please go during walk in hours:  Monday through Wednesday, from 7:30 am to 10:30 am.  Services are provided on a first come, first served basis. ?Contact information: ?[address] ?[place] [address] ?[phone] ? ?  ?  ? ?  ?  ? ?  ? ? ?Next level of care provider has access to [place] ? ?Safety Planning and Suicide Prevention discussed: Yes,  with patient due to declining consents  ? ?  ? ?Has patient been referred to the Quitline?: N/A patient is not a smoker ? ?Patient has been referred for addiction treatment: N/A ? ?Vassie Moselle, LCSW ?06/23/2021, 1:24 PM ?

## 2021-06-23 NOTE — Progress Notes (Signed)
Patient presents with anxiety and depression. Pt observed interacting appropriately with staff and peers on the unit. Pt compliant with medication administration per MD orders. Pt given education, support, and encouragement to be active in his treatment plan. Pt being monitored Q 15 minutes for safety per unit protocol. Pt remains safe on the unit.  ?

## 2021-06-23 NOTE — Plan of Care (Signed)
?  Problem: Safety: ?Goal: Periods of time without injury will increase ?Outcome: Progressing ?  ?Problem: Coping: ?Goal: Coping ability will improve ?Outcome: Progressing ?  ?Problem: Medication: ?Goal: Compliance with prescribed medication regimen will improve ?Outcome: Progressing ?  ?Problem: Self-Concept: ?Goal: Ability to disclose and discuss suicidal ideas will improve ?Outcome: Progressing ?  ?

## 2021-06-23 NOTE — BHH Suicide Risk Assessment (Addendum)
[facility] Discharge Suicide Risk Assessment ? ? ?Principal Problem: MDD (major depressive disorder), recurrent, severe, with psychosis ([place]) ?Discharge Diagnoses: Principal Problem: ?  MDD (major depressive disorder), recurrent, severe, with psychosis ([place]) ?Active Problems: ?  Primary insomnia ?  Suicide ideation ?  GAD (generalized anxiety disorder) ?  Benign essential hypertension ?  Vitamin D deficiency ? ?Total Time Spent in Direct Patient Care:  ?I personally spent 35 minutes on the unit in direct patient care. The direct patient care time included face-to-face time with the patient, reviewing the patient's chart, communicating with other professionals, and coordinating care. Greater than 50% of this time was spent in counseling or coordinating care with the patient regarding goals of hospitalization, psycho-education, and discharge planning needs. ? ? ?Subjective: Patient was seen on rounds.  He denies HI but reports passive SI.  He can contract for safety today.  He denies AVH, paranoia, ideas of reference or first rank symptoms.  He states he is still feeling depressed and anxious.  He reports fair sleep.  He did shower this morning but states that he has not combed his hair.  He states his appetite is fair and he is still requiring prompts from nursing to be out of bed during the day.  He denies medication side effects.  He reports some intermittent nausea but denies vomiting, diarrhea, abdominal pain fever, chest pain or shortness of breath.  He does note that he feels less restless today with transition off of Zyprexa and onto Seroquel as well as with start of Ativan.  It was discussed again that Ativan is temporary for recent akathisias and will not be continued long-term.  He is still interested in ECT when a bed is available at [facility]  I advised that some of his medications would have to be held or discontinued should he start ECT and he verbalized understanding.  Time was given for  questions. ? ?Musculoskeletal: ?Strength & Muscle Tone: within normal limits ?Gait & Station:  shuffling, normal station ?Patient leans: Front ? ?Psychiatric Specialty Exam ? ?Presentation  ?General Appearance: Uncombed hair, in hospital scrubs, disheveled appearing ? ?Eye Contact:Fair ? ?Speech:Clear and Coherent ? ?Speech Volume:Decreased ? ?Mood and Affect  ?Mood:Anxious; Depressed ? ?Duration of Depression Symptoms: Greater than two weeks ? ?Affect:constricted, anxious ? ? ?Thought Process  ?Thought Processes:Linear and coherent ? ?Descriptions of Associations:Intact ? ?Orientation:Full (Time, Place and Person) ? ?Thought Content:Reports passive SI but contracts for safety; denies HI; denies AVH,paranoia, delusions, ideas of reference or first rank symptoms; is not grossly responding to internal/external stimuli on exam ? ?Hallucinations:Denied ? ?Ideas of Reference:None ? ?Suicidal Thoughts:Passive without intent or plan - contracts for safety on the unit ? ?Homicidal Thoughts:Homicidal Thoughts: No ? ?Sensorium  ?Memory: Recent - fair ? ?Judgment:Poor ? ?Insight:Poor ? ? ?Executive Functions  ?Concentration:Fair ? ?Attention Span:Fair ? ?Recall:Fair ? ?[place] ? ?Language:Fair ? ? ?Psychomotor Activity  ?Psychomotor Activity:No longer restless, no akathisia today, no noted tremors ? ? ?Assets  ?Assets:Communication Skills; Housing ? ?Physical Exam: ?Physical Exam ?Vitals and nursing note reviewed.  ?HENT:  ?   Head: Normocephalic.  ?Pulmonary:  ?   Effort: Pulmonary effort is normal.  ?Neurological:  ?   General: No focal deficit present.  ?   Mental Status: He is alert.  ? ?Review of Systems  ?Constitutional:  Negative for fever.  ?Respiratory:  Negative for shortness of breath.   ?Cardiovascular:  Negative for chest pain.  ?Gastrointestinal:  Positive for nausea. Negative for abdominal  pain, diarrhea and vomiting.  ?Blood pressure 121/78, pulse 79, temperature 97.7 ?F (36.5 ?C), temperature  source Oral, resp. rate 16, height 5' 6.54" (1.69 m), weight 71.8 kg, SpO2 99 %. Body mass index is 25.13 kg/m?. ? ?Mental Status Per Nursing Assessment::   ?On Admission:  NA ? ?Demographic Factors:  ?Male, Living alone, and Unemployed, low socioeconomic status, Caucasian ? ?Loss Factors: ?Decrease in vocational status, Decline in physical health, and Financial problems/change in socioeconomic status ? ?Historical Factors: ?Impulsivity, previous suicide attempt, previous psychiatric admissions/treatments ? ?Risk Reduction Factors:   ?Positive coping skills or problem solving skills ? ?Continued Clinical Symptoms:  ?Depression:   Anhedonia ?Hopelessness ?Impulsivity ?Insomnia ?Severe ?More than one psychiatric diagnosis ?Previous Psychiatric Diagnoses and Treatments ? ?Cognitive Features That Contribute To Risk:  ?Thought constriction (tunnel vision)   ? ?Suicide Risk:  ?Moderate:  Frequent suicidal ideation with limited intensity, and duration, some specificity in terms of plans, no associated intent, good self-control, limited dysphoria/symptomatology, some risk factors present, and identifiable protective factors, including available and accessible social support. ? ? Follow-up Information   ? ? [facility] Follow up on 06/29/2021.   ?Specialty: Behavioral Health ?Why: You have an appointment for medication management services on 06/29/21 at 3:30 pm; (This will be a Virtual appointment)   For therapy services, please go during walk in hours:  Monday through Wednesday, from 7:30 am to 10:30 am.  Services are provided on a first come, first served basis. ?Contact information: ?[address] ?[place] ?[phone] ? ?  ?  ? ? [facility] Follow up on 06/23/2021.   ?Contact information: ?[id number] ?[place] ?[phone] ? ?  ?  ? ?  ?  ? ?  ? ? ?Plan Of Care/Follow-up recommendations:  ?Patient accepted for transfer  to [facility] psychiatric adult unit for consideration of ECT. Dr Weber Cooks is accepting provider. Patient is in agreement with this transfer and r/b/se/a to transfer discussed with patient. Rapid COVID ordered. BMP today WNL. Repeat EKG for trending of QTC pending.  ? ?Harlow Asa, MD, FAPA ?06/23/2021, 1:29 PM ?

## 2021-06-23 NOTE — Progress Notes (Signed)
Patient ID: Casey Silva, male   DOB: 08-12-1955, 66 y.o.   MRN: [id number] ? ? ?Pt ambulatory, alert, and oriented on and off the unit. Education, support, and encouragement provided. Pt had no belongings and no locker assigned. Pt discharged to [facility] in lobby to be transported to [facility] for admission. ?

## 2021-06-23 NOTE — Plan of Care (Signed)
?  Problem: Education: ?Goal: Emotional status will improve ?Outcome: Progressing ?Goal: Verbalization of understanding the information provided will improve ?Outcome: Progressing ?  ?Problem: Education: ?Goal: Mental status will improve ?Outcome: Not Progressing ?  ?

## 2021-06-23 NOTE — Progress Notes (Signed)
Admit Note- ?Patient transferred from [place] to [facility] to pursue further treatment for his depression with ECT treatment. Patient states '' I don't really know where this depression came from. It just hit me out of nowhere two years ago, and now I'm just so depressed, I have no one but my place and my care. I still have thoughts of wanting to kill myself, even now I still think about it some but I will let you know if I ever consider acting on it. I can be safe here. '' Patient also states he rates his depression at 7/10 , ten being worst on depression scale. Patient was oriented to unit and ward rules. Brief overview of ECT explained to patient and he states he hopes it will help his depression. Patient states he did not receive dinner so meal tray request was completed. Pt is safe, will con't to monitor.  ?

## 2021-06-23 NOTE — Progress Notes (Signed)
Patient ID: Casey Silva, male   DOB: Feb 07, 1956, 66 y.o.   MRN: [id number] ? ? ?Report [facility] to [facility] and given to Wilber Bihari, Therapist, sports. ?

## 2021-06-23 NOTE — Tx Team (Signed)
Initial Treatment Plan ?06/23/2021 ?6:49 PM ?[name] ?[id number] ? ? ? ?PATIENT STRESSORS: ?Loss of connection with work, loss of sense of purpose   ?Other: loss of mental health   ? ? ?PATIENT STRENGTHS: ?Ability for insight  ?Communication skills  ?General fund of knowledge  ?Motivation for treatment/growth  ? ? ?PATIENT IDENTIFIED PROBLEMS: ?  ?  ?  ?  ?  ?  ?  ?  ?  ?  ? ?DISCHARGE CRITERIA:  ?Ability to meet basic life and health needs ?Adequate post-discharge living arrangements ?Improved stabilization in mood, thinking, and/or behavior ?Motivation to continue treatment in a less acute level of care ?Reduction of life-threatening or endangering symptoms to within safe limits ? ?PRELIMINARY DISCHARGE PLAN: ?Attend aftercare/continuing care group ?Attend PHP/IOP ? ?PATIENT/FAMILY INVOLVEMENT: ?This treatment plan has been presented to and reviewed with the patient, Casey Silva, and/or family member, .  The patient and family have been given the opportunity to ask questions and make suggestions. ? ?Leonia Reader, RN ?06/23/2021, 6:49 PM ?

## 2021-06-23 NOTE — Progress Notes (Signed)
? ? ?   06/22/21 2200  ?Psych Admission Type (Psych Patients Only)  ?Admission Status Voluntary  ?Psychosocial Assessment  ?Patient Complaints Anxiety;Depression;Other (Comment) ?(Nausea)  ?Eye Contact Avoids  ?Facial Expression Animated  ?Affect Appropriate to circumstance  ?Speech Logical/coherent  ?Interaction Cautious  ?Motor Activity Slow;Shuffling  ?Appearance/Hygiene Improved  ?Behavior Characteristics Cooperative  ?Mood Depressed;Anxious  ?Thought Process  ?Coherency WDL  ?Content WDL  ?Delusions None reported or observed  ?Perception WDL  ?Hallucination None reported or observed  ?Judgment Poor  ?Confusion None  ?Danger to Self  ?Current suicidal ideation? Denies  ?Self-Injurious Behavior No self-injurious ideation or behavior indicators observed or expressed   ?Agreement Not to Harm Self Yes  ?Description of Agreement verbal contract for safety  ?Danger to Others  ?Danger to Others None reported or observed  ? ? ?

## 2021-06-23 NOTE — Plan of Care (Signed)
Notified by Dr. Weber Cooks that the patient has been accepted to [facility] with a bed today. A repeat COVID swab has been ordered for transfer and EMTALA paperwork started. I made patient aware of pending transfer and r/b/se/a to transfer to potentially start ECT and he agrees with this plan.  ? ?Viann Fish, MD, FAPA ?

## 2021-06-24 ENCOUNTER — Other Ambulatory Visit: Payer: Self-pay | Admitting: Psychiatry

## 2021-06-24 DIAGNOSIS — I10 Essential (primary) hypertension: Secondary | ICD-10-CM

## 2021-06-24 DIAGNOSIS — F333 Major depressive disorder, recurrent, severe with psychotic symptoms: Secondary | ICD-10-CM

## 2021-06-24 NOTE — Group Note (Signed)
[place] LCSW Group Therapy Note ? ? ?Group Date: 06/24/2021 ?Start Time: 1300 ?End Time: 1400 ? ? ?Type of Therapy/Topic:  Group Therapy:  Emotion Regulation ? ?Participation Level:  Active  ? ?Mood: ? ?Description of Group:   ? The purpose of this group is to assist patients in learning to regulate negative emotions and experience positive emotions. Patients will be guided to discuss ways in which they have been vulnerable to their negative emotions. These vulnerabilities will be juxtaposed with experiences of positive emotions or situations, and patients challenged to use positive emotions to combat negative ones. Special emphasis will be placed on coping with negative emotions in conflict situations, and patients will process healthy conflict resolution skills. ? ?Therapeutic Goals: ?Patient will identify two positive emotions or experiences to reflect on in order to balance out negative emotions:  ?Patient will label two or more emotions that they find the most difficult to experience:  ?Patient will be able to demonstrate positive conflict resolution skills through discussion or role plays:  ? ?Summary of Patient Progress: ?Patient was present in group and an active participant.  Patient shared that he feels anxious.  CSW was able to evidence the pts anxiety by patient's self-reports and patient constantly getting up from his seat and inability to remain still.  Patient was able to share his coping skills and why he felt emotion regulation was important.   ? ?Therapeutic Modalities:   ?Cognitive Behavioral Therapy ?Feelings Identification ?Dialectical Behavioral Therapy ? ? ?Rozann Lesches, LCSW ?

## 2021-06-24 NOTE — Progress Notes (Signed)
Recreation Therapy Notes ? ?INPATIENT RECREATION TR PLAN ? ?Patient Details ?Name: Casey Silva ?MRN: [id number] ?DOB: 06-Nov-1955 ?Today's Date: 06/24/2021 ? ?Rec Therapy Plan ?Is patient appropriate for Therapeutic Recreation?: Yes ?Treatment times per week: At least 3 ?Estimated Length of Stay: 5-7 days ?TR Treatment/Interventions: Group participation (Comment) ? ?Discharge Criteria ?Pt will be discharged from therapy if:: Discharged ?Treatment plan/goals/alternatives discussed and agreed upon by:: Patient/family ? ?Discharge Summary ?  ? ? ?Hartlyn Reigel ?06/24/2021, 3:24 PM ?

## 2021-06-24 NOTE — BHH Suicide Risk Assessment (Signed)
[facility] Admission Suicide Risk Assessment ? ? ?Nursing information obtained from:  Patient, Review of record ?Demographic factors:  Male, Age 66, Living alone ?Current Mental Status:  Suicidal ideation indicated by patient, Self-harm thoughts ?Loss Factors:  Decrease in vocational status, Decline in physical health ?Historical Factors:  Prior suicide attempts ?Risk Reduction Factors:  Positive coping skills or problem solving skills ? ?Total Time spent with patient: 1 hour ?Principal Problem: Severe recurrent major depression with psychotic features ([place]) ?Diagnosis:  Principal Problem: ?  Severe recurrent major depression with psychotic features ([place]) ?Active Problems: ?  Primary insomnia ?  GAD (generalized anxiety disorder) ?  Essential hypertension ? ?Subjective Data: Patient seen and chart reviewed.  66 year old man with a history of depression presented to [facility] with severe depressed mood with suicidal thoughts to stab himself.  He has been in the hospital now for a couple weeks receiving treatment and has not shown much improvement.  Continues to still feel depressed hopeless down negative with very low energy.  Not having any hallucinations currently but still having suicidal thoughts and hopelessness despite antidepressant medicine.  He was referred to [place] to consider ECT.  Patient continues to describe feeling depressed mood low-energy hopeless much of the time.  Suicidal thoughts without active plan.  No hallucinations.  He had peers disheveled poorly kempt with blunted dysphoric affect.  Good insight however.  Patient is agreeable to proposal for ECT.  Denies that he had been having any drinking or using alcohol at home.  Does have an outpatient psychiatric provider but had briefly been off his medicine. ? ?Continued Clinical Symptoms:  ?Alcohol Use Disorder Identification Test Final Score (AUDIT): 0 ?The "Alcohol Use Disorders Identification Test", Guidelines for Use in Primary Care, Second  Edition.  World Pharmacologist [facility]). ?Score between 0-7:  no or low risk or alcohol related problems. ?Score between 8-15:  moderate risk of alcohol related problems. ?Score between 16-19:  high risk of alcohol related problems. ?Score 20 or above:  warrants further diagnostic evaluation for alcohol dependence and treatment. ? ? ?CLINICAL FACTORS:  ? Depression:   Hopelessness ? ? ?Musculoskeletal: ?Strength & Muscle Tone: within normal limits ?Gait & Station: normal ?Patient leans: N/A ? ?Psychiatric Specialty Exam: ? ?Presentation  ?General Appearance: Appropriate for Environment; Bizarre; Disheveled; Fairly Groomed ? ?Eye Contact:Good ? ?Speech:Clear and Coherent; Normal Rate ? ?Speech Volume:Normal ? ?Handedness:Right ? ? ?Mood and Affect  ?Mood:Anxious; Depressed; Hopeless; Irritable ? ?Affect:Blunt; Depressed ? ? ?Thought Process  ?Thought Processes:Coherent; Linear ? ?Descriptions of Associations:Intact ? ?Orientation:Full (Time, Place and Person) ? ?Thought Content:Logical; WDL ? ?History of Schizophrenia/Schizoaffective disorder:No ? ?Duration of Psychotic Symptoms:N/A ? ?Hallucinations:Hallucinations: None ? ?Ideas of Reference:None ? ?Suicidal Thoughts:Suicidal Thoughts: No ? ?Homicidal Thoughts:Homicidal Thoughts: No ? ? ?Sensorium  ?Memory:Immediate Fair; Recent Fair; Remote Fair ? ?Judgment:Fair ? ?Insight:Fair ? ? ?Executive Functions  ?Concentration:Fair ? ?Attention Span:Good ? ?Recall:Good ? ?[place] ? ?Language:Good ? ? ?Psychomotor Activity  ?Psychomotor Activity:Psychomotor Activity: Normal; Restlessness ? ? ?Assets  ?Assets:Communication Skills; Physical Health; Social Support ? ? ?Sleep  ?Sleep:Sleep: Good ?Number of Hours of Sleep: 6 ? ? ? ?Physical Exam: ?Physical Exam ?Vitals and nursing note reviewed.  ?Constitutional:   ?   Appearance: He is ill-appearing.  ?HENT:  ?   Head: Normocephalic and atraumatic.  ?   Mouth/Throat:  ?   Pharynx: Oropharynx is clear.  ?Eyes:  ?    Pupils: Pupils are equal, round, and reactive to light.  ?Cardiovascular:  ?  Rate and Rhythm: Normal rate and regular rhythm.  ?Pulmonary:  ?   Effort: Pulmonary effort is normal.  ?   Breath sounds: Normal breath sounds.  ?Abdominal:  ?   General: Abdomen is flat.  ?   Palpations: Abdomen is soft.  ?Musculoskeletal:     ?   General: Normal range of motion.  ?Skin: ?   General: Skin is warm and dry.  ?Neurological:  ?   General: No focal deficit present.  ?   Mental Status: He is alert. Mental status is at baseline.  ?Psychiatric:     ?   Attention and Perception: Attention normal. He is attentive.     ?   Mood and Affect: Mood is depressed. Affect is blunt.     ?   Speech: Speech is delayed.     ?   Behavior: Behavior is slowed.     ?   Thought Content: Thought content includes suicidal ideation. Thought content does not include suicidal plan.     ?   Cognition and Memory: Cognition is impaired.  ? ?Review of Systems  ?Constitutional: Negative.   ?HENT: Negative.    ?Eyes: Negative.   ?Respiratory: Negative.    ?Cardiovascular: Negative.   ?Gastrointestinal: Negative.   ?Musculoskeletal: Negative.   ?Skin: Negative.   ?Neurological: Negative.   ?Psychiatric/Behavioral:  Positive for depression and suicidal ideas. Negative for hallucinations and substance abuse. The patient is nervous/anxious and has insomnia.   ?Blood pressure 108/78, pulse 85, temperature (!) 97.5 ?F (36.4 ?C), temperature source Oral, resp. rate 17, height _0  (1.676 m), weight 76.2 kg, SpO2 97 %. Body mass index is 27.12 kg/m?. ? ? ?COGNITIVE FEATURES THAT CONTRIBUTE TO RISK:  ?Thought constriction (tunnel vision)   ? ?SUICIDE RISK:  ? Mild:  Suicidal ideation of limited frequency, intensity, duration, and specificity.  There are no identifiable plans, no associated intent, mild dysphoria and related symptoms, good self-control (both objective and subjective assessment), few other risk factors, and identifiable protective factors,  including available and accessible social support. ? ?PLAN OF CARE: Continue 15-minute check.  Engage in individual and group therapy.  Met with treatment team today.  Reviewed medication.  Minor adjustments will be made.  Patient is agreeable to the proposal for ECT which we will plan on starting on Monday. ? ?I certify that inpatient services furnished can reasonably be expected to improve the patient's condition.  ? ?Alethia Berthold, MD ?06/24/2021, 3:37 PM ? ?

## 2021-06-24 NOTE — Progress Notes (Signed)
Recreation Therapy Notes ? ?INPATIENT RECREATION THERAPY ASSESSMENT ? ?Patient Details ?Name: Casey Silva ?MRN: [id number] ?DOB: [birth year] ?Today's Date: 06/24/2021 ?      ?Information Obtained From: ?Patient ? ?Able to Participate in Assessment/Interview: ?Yes ? ?Patient Presentation: ?Responsive ? ?Reason for Admission (Per Patient): ?Active Symptoms ? ?Patient Stressors: ?  ? ?Coping Skills:   ?Exercise, Deep Breathing ? ?Leisure Interests (2+):  ?Exercise - Walking, Individual - TV ? ?Frequency of Recreation/Participation: ?Monthly ? ?Awareness of Community Resources:  ?No ? ?Community Resources:  ?  ? ?Current Use: ?  ? ?If no, Barriers?: ?  ? ?Expressed Interest in [employer] Information: ?Yes ? ?[place] of Residence:  ?[place] ? ?Patient Main Form of Transportation: ?Car ? ?Patient Strengths:  ?Perseverance ? ?Patient Identified Areas of Improvement:  ?My outlook on life ? ?Patient Goal for Hospitalization:  ?Get ECT ? ?Current SI (including self-harm):  ?No ? ?Current HI:  ?No ? ?Current AVH: ?No ? ?Staff Intervention Plan: ?Group Attendance, Collaborate with Interdisciplinary Treatment Team ? ?Consent to Intern Participation: ?N/A ? ?Casey Silva ?06/24/2021, 3:24 PM ?

## 2021-06-24 NOTE — BH IP Treatment Plan (Signed)
Interdisciplinary Treatment and Diagnostic Plan Update ? ?06/24/2021 ?Time of Session: 9:30AM ?Casey Silva ?MRN: [id number] ? ?Principal Diagnosis: Severe recurrent major depression with psychotic features ([place]) ? ?Secondary Diagnoses: Principal Problem: ?  Severe recurrent major depression with psychotic features ([place]) ? ? ?Current Medications:  ?Current Facility-Administered Medications  ?Medication Dose Route Frequency Provider Last Rate Last Admin  ? amLODipine (NORVASC) tablet 10 mg  10 mg Oral Daily Clapacs, [name], MD   10 mg at 06/24/21 0806  ? diclofenac Sodium (VOLTAREN) 1 % topical gel 2 g  2 g Topical QID Clapacs, [name], MD      ? feeding supplement (ENSURE ENLIVE / ENSURE PLUS) liquid 237 mL  237 mL Oral BID BM Clapacs, [name], MD   237 mL at 06/24/21 1450  ? gabapentin (NEURONTIN) capsule 400 mg  400 mg Oral TID Clapacs, [name], MD   400 mg at 06/24/21 1146  ? hydrOXYzine (ATARAX) tablet 50 mg  50 mg Oral Q6H PRN Clapacs, [name], MD   50 mg at 06/23/21 2121  ? melatonin tablet 5 mg  5 mg Oral QHS Clapacs, [name], MD   5 mg at 06/23/21 2119  ? mirtazapine (REMERON) tablet 30 mg  30 mg Oral QHS Clapacs, [name], MD   30 mg at 06/23/21 2120  ? multivitamin with minerals tablet 1 tablet  1 tablet Oral Daily Clapacs, [name], MD   1 tablet at 06/24/21 0806  ? ondansetron (ZOFRAN-ODT) disintegrating tablet 4 mg  4 mg Oral Q4H PRN Clapacs, [name], MD      ? propranolol (INDERAL) tablet 10 mg  10 mg Oral TID Clapacs, [name], MD   10 mg at 06/24/21 0806  ? QUEtiapine (SEROQUEL) tablet 100 mg  100 mg Oral QHS Clapacs, [name], MD   100 mg at 06/23/21 2120  ? QUEtiapine (SEROQUEL) tablet 25 mg  25 mg Oral BID Clapacs, [name], MD   25 mg at 06/24/21 0806  ? traZODone (DESYREL) tablet 100 mg  100 mg Oral QHS Clapacs, [name], MD   100 mg at 06/23/21 2119  ? traZODone (DESYREL) tablet 100 mg  100 mg Oral QHS PRN Clapacs, [name], MD      ? venlafaxine XR (EFFEXOR-XR) 24 hr capsule 112.5 mg  112.5 mg Oral Q breakfast  Clapacs, [name], MD   112.5 mg at 06/24/21 5956  ? [START ON 06/25/2021] Vitamin D (Ergocalciferol) (DRISDOL) capsule 50,000 Units  50,000 Units Oral Q7 days Clapacs, [name], MD      ? ?PTA Medications: ?Medications Prior to Admission  ?Medication Sig Dispense Refill Last Dose  ? amLODipine (NORVASC) 10 MG tablet Take 1 tablet (10 mg total) by mouth daily. 30 tablet 0   ? gabapentin (NEURONTIN) 400 MG capsule Take 1 capsule (400 mg total) by mouth 3 (three) times daily. 90 capsule 0   ? LORazepam (ATIVAN) 0.5 MG tablet Take 1 tablet (0.5 mg total) by mouth 2 (two) times daily as needed for anxiety. 30 tablet 0   ? melatonin 5 MG TABS Take 1 tablet (5 mg total) by mouth at bedtime. 30 tablet 0   ? mirtazapine (REMERON) 30 MG tablet Take 1 tablet (30 mg total) by mouth at bedtime. 30 tablet 3   ? propranolol (INDERAL) 10 MG tablet Take 1 tablet (10 mg total) by mouth 3 (three) times daily. 90 tablet 0   ? QUEtiapine (SEROQUEL) 100 MG tablet Take 1 tablet (  100 mg total) by mouth at bedtime. 30 tablet 0   ? QUEtiapine (SEROQUEL) 25 MG tablet Take 1 tablet (25 mg total) by mouth 2 (two) times daily. 60 tablet 0   ? traZODone (DESYREL) 100 MG tablet Take 1 tablet (100 mg total) by mouth at bedtime. 30 tablet 0   ? venlafaxine XR (EFFEXOR-XR) 37.5 MG 24 hr capsule Take 3 capsules (112.5 mg total) by mouth daily with breakfast. 30 capsule 0   ? [START ON 06/25/2021] Vitamin D, Ergocalciferol, (DRISDOL) 1.25 MG (50000 UNIT) CAPS capsule Take 1 capsule (50,000 Units total) by mouth every 7 (seven) days. 5 capsule 0   ? ? ?Patient Stressors: Loss of connection with work, loss of sense of purpose   ?Other: loss of mental health   ? ?Patient Strengths: Ability for insight  ?Communication skills  ?General fund of knowledge  ?Motivation for treatment/growth  ? ?Treatment Modalities: Medication Management, Group therapy, Case management,  ?1 to 1 session with clinician, Psychoeducation, Recreational therapy. ? ? ?Physician Treatment  Plan for Primary Diagnosis: Severe recurrent major depression with psychotic features ([place]) ?Long Term Goal(s):    ? ?Short Term Goals:   ? ?Medication Management: Evaluate patient's response, side effects, and tolerance of medication regimen. ? ?Therapeutic Interventions: 1 to 1 sessions, Unit Group sessions and Medication administration. ? ?Evaluation of Outcomes: Progressing ? ?Physician Treatment Plan for Secondary Diagnosis: Principal Problem: ?  Severe recurrent major depression with psychotic features ([place]) ? ?Long Term Goal(s):    ? ?Short Term Goals:      ? ?Medication Management: Evaluate patient's response, side effects, and tolerance of medication regimen. ? ?Therapeutic Interventions: 1 to 1 sessions, Unit Group sessions and Medication administration. ? ?Evaluation of Outcomes: Progressing ? ? ?RN Treatment Plan for Primary Diagnosis: Severe recurrent major depression with psychotic features ([place]) ?Long Term Goal(s): Knowledge of disease and therapeutic regimen to maintain health will improve ? ?Short Term Goals: Ability to demonstrate self-control, Ability to participate in decision making will improve, Ability to verbalize feelings will improve, Ability to disclose and discuss suicidal ideas, Ability to identify and develop effective coping behaviors will improve, and Compliance with prescribed medications will improve ? ?Medication Management: RN will administer medications as ordered by provider, will assess and evaluate patient's response and provide education to patient for prescribed medication. RN will report any adverse and/or side effects to prescribing provider. ? ?Therapeutic Interventions: 1 on 1 counseling sessions, Psychoeducation, Medication administration, Evaluate responses to treatment, Monitor vital signs and CBGs as ordered, Perform/monitor CIWA, COWS, AIMS and Fall Risk screenings as ordered, Perform wound care treatments as ordered. ? ?Evaluation of Outcomes: Progressing ? ? ?LCSW  Treatment Plan for Primary Diagnosis: Severe recurrent major depression with psychotic features ([place]) ?Long Term Goal(s): Safe transition to appropriate next level of care at discharge, Engage patient in therapeutic group addressing interpersonal concerns. ? ?Short Term Goals: Engage patient in aftercare planning with referrals and resources, Increase social support, Increase ability to appropriately verbalize feelings, Increase emotional regulation, Facilitate acceptance of mental health diagnosis and concerns, and Increase skills for wellness and recovery ? ?Therapeutic Interventions: Assess for all discharge needs, 1 to 1 time with Education officer, museum, Explore available resources and support systems, Assess for adequacy in community support network, Educate family and significant other(s) on suicide prevention, Complete Psychosocial Assessment, Interpersonal group therapy. ? ?Evaluation of Outcomes: Progressing ? ? ?Progress in Treatment: ?Attending groups: Yes. ?Participating in groups: Yes. ?Taking medication as prescribed: Yes. ?Toleration medication: Yes. ?Family/Significant  other contact made: No, will contact:  once permission is given.  ?Patient understands diagnosis: Yes. ?Discussing patient identified problems/goals with staff: Yes. ?Medical problems stabilized or resolved: Yes. ?Denies suicidal/homicidal ideation: Yes. ?Issues/concerns per patient self-inventory: No. ?Other: none ? ?New problem(s) identified: No, Describe:  none ? ?New Short Term/Long Term Goal(s): medication management for mood stabilization; elimination of SI thoughts; development of comprehensive mental wellness/sobriety plan.  ? ?Patient Goals: "I'm supposed to be here for ECT."  ? ?Discharge Plan or Barriers: CSW to assist with development of plans.  Patient reports plans to return to his home and continuing with his current mental health provider.  ? ?Reason for Continuation of Hospitalization: Anxiety ?Depression ?Medication  stabilization ?Suicidal ideation ? ?Estimated Length of Stay:  1-7 days ? ? ?Scribe for Treatment Team: ?Rozann Lesches, LCSW ?06/24/2021 ?2:57 PM ?
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
## 2021-06-24 NOTE — Plan of Care (Signed)
Has been active in the milieu, cooperative and participating in groups. Appetite is improved. Taking medications as scheduled. ?

## 2021-06-24 NOTE — Progress Notes (Signed)
Recreation Therapy Notes ? ? ?Date: 06/24/2021 ? ?Time: 10:45 am ? ?Location: Courtyard  ? ?Behavioral response: N/A ? ?Intervention Topic: Leisure   ? ?Discussion/Intervention: ?Patient refused to attend group. ? ?Clinical Observations/Feedback: ?Patient refused to attend group. ? ?Juanna Pudlo LRT/CTRS ? ? ? ? ? ? ? ?Nala Kachel ?06/24/2021 12:53 PM ?

## 2021-06-24 NOTE — [facility] Suicide Risk Assessment (Signed)
[facility] INPATIENT:  Family/Significant Other Suicide Prevention Education ? ?Suicide Prevention Education:  ?Patient Refusal for Family/Significant Other Suicide Prevention Education: The patient Casey Silva has refused to provide written consent for family/significant other to be provided Family/Significant Other Suicide Prevention Education during admission and/or prior to discharge.  Physician notified. ? ?SPE completed with pt, as pt refused to consent to family contact. SPI pamphlet provided to pt and pt was encouraged to share information with support network, ask questions, and talk about any concerns relating to SPE. Pt denies access to guns/firearms and verbalized understanding of information provided. Mobile Crisis information also provided to pt.  ? ?Rozann Lesches ?06/24/2021, 3:23 PM ?

## 2021-06-24 NOTE — H&P (Signed)
Psychiatric Admission Assessment Adult ? ?Patient Identification: Casey Silva ?MRN:  [id number] ?Date of Evaluation:  06/24/2021 ?Chief Complaint:  Severe recurrent major depression with psychotic features ([place]) [F33.3] ?Principal Diagnosis: Severe recurrent major depression with psychotic features ([place]) ?Diagnosis:  Principal Problem: ?  Severe recurrent major depression with psychotic features ([place]) ?Active Problems: ?  Primary insomnia ?  GAD (generalized anxiety disorder) ?  Essential hypertension ? ?History of Present Illness: Patient seen and chart reviewed.  66 year old man transferred to [place] from [place].  He had presented there with suicidal ideation and severe depression.  Patient reports that he had run out of his psychiatric medicines for just a couple days but had had severe panic attacks became very depressed started thinking about stabbing himself.  Since being in the hospital he has continued to be depressed and nervous.  Medications have been adjusted but he has shown little or no improvement although he is no longer reporting psychotic symptoms.  Patient was referred for consideration of ECT.  Patient reports that he continues to feel sad and down negative and hopeless.  Low energy.  No motivation.  Denies that he is having hallucinations but still has thoughts about wishing he were dead or about wanting to kill himself.  He has been compliant with medicine.  Patient seems to live a pretty isolated life at home.  Other than running out of his medicine no specific new stresses.  Denied that he had been drinking or abusing any drugs. ?Associated Signs/Symptoms: ?Depression Symptoms:  depressed mood, ?anhedonia, ?insomnia, ?fatigue, ?feelings of worthlessness/guilt, ?difficulty concentrating, ?hopelessness, ?impaired memory, ?suicidal thoughts with specific plan, ?loss of energy/fatigue, ?Duration of Depression Symptoms: Greater than two weeks ? ?(Hypo) Manic Symptoms:  Impulsivity, ?Anxiety  Symptoms:  Excessive Worry, ?Psychotic Symptoms:   None currently ?PTSD Symptoms: ?Negative ?Total Time spent with patient: 1 hour ? ?Past Psychiatric History: Past history of chronic anxiety and depression.  He has had 2 prior psychiatric hospitalizations in his life.  No history of ECT.  At least 1 serious suicide attempt in the past. ? ?Is the patient at risk to self? Yes.    ?Has the patient been a risk to self in the past 6 months? Yes.    ?Has the patient been a risk to self within the distant past? Yes.    ?Is the patient a risk to others? No.  ?Has the patient been a risk to others in the past 6 months? No.  ?Has the patient been a risk to others within the distant past? No.  ? ?Prior Inpatient Therapy:   ?Prior Outpatient Therapy:   ? ?Alcohol Screening: 1. How often do you have a drink containing alcohol?: Never ?2. How many drinks containing alcohol do you have on a typical day when you are drinking?: 1 or 2 ?3. How often do you have six or more drinks on one occasion?: Never ?AUDIT-C Score: 0 ?4. How often during the last year have you found that you were not able to stop drinking once you had started?: Never ?5. How often during the last year have you failed to do what was normally expected from you because of drinking?: Never ?6. How often during the last year have you needed a first drink in the morning to get yourself going after a heavy drinking session?: Never ?7. How often during the last year have you had a feeling of guilt of remorse after drinking?: Never ?8. How often during the last year have you been unable  to remember what happened the night before because you had been drinking?: Never ?9. Have you or someone else been injured as a result of your drinking?: No ?10. Has a relative or friend or a doctor or another health worker been concerned about your drinking or suggested you cut down?: No ?Alcohol Use Disorder Identification Test Final Score (AUDIT): 0 ?Substance Abuse History in the last  12 months:  No. ?Consequences of Substance Abuse: ?Negative ?Previous Psychotropic Medications: Yes  ?Psychological Evaluations: Yes  ?Past Medical History:  ?Past Medical History:  ?Diagnosis Date  ? Anxiety   ? Asthma   ? Depression   ? History reviewed. No pertinent surgical history. ?Family History:  ?Family History  ?Problem Relation Age of Onset  ? Bipolar disorder Mother   ? ?Family Psychiatric  History: Mother with bipolar disorder ?Tobacco Screening:   ?Social History:  ?Social History  ? ?Substance and Sexual Activity  ?Alcohol Use No  ?   ?Social History  ? ?Substance and Sexual Activity  ?Drug Use No  ?  ?Additional Social History: ?  ?   ?  ?  ?  ?  ?  ?  ?  ?  ?  ?  ? ?Allergies:   ?Allergies  ?Allergen Reactions  ? Dust Mite Mixed Allergen Ext [Mite (D. Farinae)] Shortness Of Breath  ? Bee Pollen Other (See Comments)  ?  Watery Eyes  ? ?Lab Results:  ?Results for orders placed or performed during the hospital encounter of 06/07/21 (from the past 48 hour(s))  ?Basic metabolic panel     Status: None  ? Collection Time: 06/23/21  6:11 AM  ?Result Value Ref Range  ? Sodium 137 135 - 145 mmol/L  ? Potassium 4.0 3.5 - 5.1 mmol/L  ? Chloride 103 98 - 111 mmol/L  ? CO2 28 22 - 32 mmol/L  ? Glucose, Bld 85 70 - 99 mg/dL  ?  Comment: Glucose reference range applies only to samples taken after fasting for at least 8 hours.  ? BUN 14 8 - 23 mg/dL  ? Creatinine, Ser 0.85 0.61 - 1.24 mg/dL  ? Calcium 9.1 8.9 - 10.3 mg/dL  ? GFR, Estimated >60 >60 mL/min  ?  Comment: (NOTE) ?Calculated using the CKD-EPI Creatinine Equation (2021) ?  ? Anion gap 6 5 - 15  ?  Comment: Performed at [facility], [place] [address] [place], [place] [address]  ?Resp Panel by RT-PCR (Flu A&B, Covid) Nasopharyngeal Swab     Status: None  ? Collection Time: 06/23/21  1:39 PM  ? Specimen: Nasopharyngeal Swab; Nasopharyngeal(NP) swabs in vial transport medium  ?Result Value Ref Range  ? SARS Coronavirus 2 by RT PCR NEGATIVE  NEGATIVE  ?  Comment: (NOTE) ?SARS-CoV-2 target nucleic acids are NOT DETECTED. ? ?The SARS-CoV-2 RNA is generally detectable in upper respiratory ?specimens during the acute phase of infection. The lowest ?concentration of SARS-CoV-2 viral copies this assay can detect is ?138 copies/mL. A negative result does not preclude SARS-Cov-2 ?infection and should not be used as the sole basis for treatment or ?other patient management decisions. A negative result may occur with  ?improper specimen collection/handling, submission of specimen other ?than nasopharyngeal swab, presence of viral mutation(s) within the ?areas targeted by this assay, and inadequate number of viral ?copies(<138 copies/mL). A negative result must be combined with ?clinical observations, patient history, and epidemiological ?information. The expected result is Negative. ? ?Fact Sheet for Patients:  ?EntrepreneurPulse.com.au ? ?Fact Sheet for  Healthcare Providers:  ?IncredibleEmployment.be ? ?This test is no t yet approved or cleared by the [place] FDA and  ?has been authorized for detection and/or diagnosis of SARS-CoV-2 by ?FDA under an Emergency Use Authorization (EUA). This EUA will remain  ?in effect (meaning this test can be used) for the duration of the ?COVID-19 declaration under Section 564(b)(1) of the Act, 21 ?U.S.C.section 360bbb-3(b)(1), unless the authorization is terminated  ?or revoked sooner.  ? ? ?  ? Influenza A by PCR NEGATIVE NEGATIVE  ? Influenza B by PCR NEGATIVE NEGATIVE  ?  Comment: (NOTE) ?The Xpert Xpress SARS-CoV-2/FLU/RSV plus assay is intended as an aid ?in the diagnosis of influenza from Nasopharyngeal swab specimens and ?should not be used as a sole basis for treatment. Nasal washings and ?aspirates are unacceptable for Xpert Xpress SARS-CoV-2/FLU/RSV ?testing. ? ?Fact Sheet for Patients: ?EntrepreneurPulse.com.au ? ?Fact Sheet for Healthcare  Providers: ?IncredibleEmployment.be ? ?This test is not yet approved or cleared by the [place] FDA and ?has been authorized for detection and/or diagnosis of SARS-CoV-2 by ?FDA under an Emergency Use Autho

## 2021-06-25 NOTE — Progress Notes (Signed)
Patient alert and oriented x 4, no distress, he appears flat and sad he forwards very little, but denies SI/HI/AVH . Patient was isolated in his room minimal interaction with peers and staff. Patient was given scheduled medication pass, 15 minutes safety checks maintained will continue to monitor closely.  ?

## 2021-06-25 NOTE — Group Note (Signed)
[facility] LCSW Group Therapy Note ? ? ?Group Date: 06/25/2021 ?Start Time: 1320 ?End Time: 6333 ? ? ?Type of Therapy and Topic: Group Therapy: Avoiding Self-Sabotaging and Enabling Behaviors ? ?Participation Level: Minimal ? ? ?Description of Group:  ?In this group, patients will learn how to identify obstacles, self-sabotaging and enabling behaviors, as well as: what are they, why do we do them and what needs these behaviors meet. Discuss unhealthy relationships and how to have positive healthy boundaries with those that sabotage and enable. Explore aspects of self-sabotage and enabling in yourself and how to limit these self-destructive behaviors in everyday life. ? ? ?Therapeutic Goals: ?1. Patient will identify one obstacle that relates to self-sabotage and enabling behaviors ?2. Patient will identify one personal self-sabotaging or enabling behavior they did prior to admission ?3. Patient will state a plan to change the above identified behavior ?4. Patient will demonstrate ability to communicate their needs through discussion and/or role play.  ? ? ?Summary of Patient Progress: Patient presented late to group and left shortly after joining. Patient participated in the icebreaker activity and identified negative thinking as a self-sabotaging behavior he has engaged in prior to admission. ? ? ? ?Therapeutic Modalities:  ?Cognitive Behavioral Therapy ?Person-Centered Therapy ?Motivational Interviewing ? ? ? ?Kenna Gilbert Rehmat Murtagh, LCSWA ?

## 2021-06-25 NOTE — Progress Notes (Signed)
D: Pt alert and oriented. Pt reports experiencing anxiety/depression 5/10 at this time. Pt denies experiencing any pain at this time. Pt endorses passive SI w/o plan, denies HI, or AVH at this time.  ? ?A: Scheduled medications administered to patient per MD orders. Support and encouragement provided. Routine safety checks conducted q15 minutes.  ? ?R: No adverse drug reactions noted. Pt is agreeable to notifying staff with any safety concerns. Pt remains complaint with medications. Pt interacts minimally with others on the unit. Pt remains safe at this time. Will continue to monitor.  ?

## 2021-06-25 NOTE — Progress Notes (Signed)
[facility] MD Progress Note ? ?06/25/2021 1:38 PM ?Casey Silva  ?MRN:  [id number] ?Subjective:  pt seen and chart reviewed.  ?Casey Silva continues to report depressive mood, stated feeling "so-so", but denied Si.  Casey Silva feels ready to give ECT a try next week.  ? ?Casey Silva tolerated the current meds but only partially responsive.   ? ?Principal Problem: Severe recurrent major depression with psychotic features ([place]) ?Diagnosis: Principal Problem: ?  Severe recurrent major depression with psychotic features ([place]) ?Active Problems: ?  Primary insomnia ?  GAD (generalized anxiety disorder) ?  Essential hypertension ? ?Total Time spent with patient: 20 minutes ? ?Past Psychiatric History: Past history of chronic anxiety and depression.  Casey Silva has had 2 prior psychiatric hospitalizations in his life.  No history of ECT.  At least 1 serious suicide attempt in the past. ? ?Past Medical History:  ?Past Medical History:  ?Diagnosis Date  ? Anxiety   ? Asthma   ? Depression   ? History reviewed. No pertinent surgical history. ?Family History:  ?Family History  ?Problem Relation Age of Onset  ? Bipolar disorder Mother   ? ?Family Psychiatric  History: mom has bipolar ?Social History:  ?Social History  ? ?Substance and Sexual Activity  ?Alcohol Use No  ?   ?Social History  ? ?Substance and Sexual Activity  ?Drug Use No  ?  ?Social History  ? ?Socioeconomic History  ? Marital status: Single  ?  Spouse name: Not on file  ? Number of children: 1  ? Years of education: Not on file  ? Highest education level: GED or equivalent  ?Occupational History  ? Not on file  ?Tobacco Use  ? Smoking status: Never  ? Smokeless tobacco: Never  ?Vaping Use  ? Vaping Use: Unknown  ?Substance and Sexual Activity  ? Alcohol use: No  ? Drug use: No  ? Sexual activity: Not Currently  ?Other Topics Concern  ? Not on file  ?Social History Narrative  ? Marital status:  Single   Children: Daughter   Employment:  Unemployment. previous work for [employer].   Alcohol:  None    Drugs:  none   Exercise:  Regularly very   Religion:  Jewish  ? ?Social Determinants of Health  ? ?Financial Resource Strain: Not on file  ?Food Insecurity: Not on file  ?Transportation Needs: Not on file  ?Physical Activity: Not on file  ?Stress: Not on file  ?Social Connections: Not on file  ? ?Additional Social History:  ?  ? ?Sleep: Fair ? ?Appetite:  Fair ? ?Current Medications: ?Current Facility-Administered Medications  ?Medication Dose Route Frequency Provider Last Rate Last Admin  ? amLODipine (NORVASC) tablet 10 mg  10 mg Oral Daily Clapacs, [name], MD   10 mg at 06/25/21 0805  ? diclofenac Sodium (VOLTAREN) 1 % topical gel 2 g  2 g Topical QID Clapacs, [name], MD      ? feeding supplement (ENSURE ENLIVE / ENSURE PLUS) liquid 237 mL  237 mL Oral BID BM Clapacs, [name], MD   237 mL at 06/25/21 1113  ? gabapentin (NEURONTIN) capsule 400 mg  400 mg Oral TID Clapacs, [name], MD   400 mg at 06/25/21 1206  ? hydrOXYzine (ATARAX) tablet 50 mg  50 mg Oral Q6H PRN Clapacs, [name], MD   50 mg at 06/23/21 2121  ? melatonin tablet 5 mg  5 mg Oral QHS Clapacs, [name], MD   5 mg at 06/24/21 2125  ?  mirtazapine (REMERON) tablet 30 mg  30 mg Oral QHS Clapacs, [name], MD   30 mg at 06/24/21 2125  ? multivitamin with minerals tablet 1 tablet  1 tablet Oral Daily Clapacs, [name], MD   1 tablet at 06/25/21 0806  ? ondansetron (ZOFRAN-ODT) disintegrating tablet 4 mg  4 mg Oral Q4H PRN Clapacs, [name], MD      ? propranolol (INDERAL) tablet 10 mg  10 mg Oral TID Clapacs, [name], MD   10 mg at 06/25/21 1331  ? QUEtiapine (SEROQUEL) tablet 100 mg  100 mg Oral QHS Clapacs, [name], MD   100 mg at 06/24/21 2125  ? QUEtiapine (SEROQUEL) tablet 25 mg  25 mg Oral BID Clapacs, [name], MD   25 mg at 06/25/21 0805  ? traZODone (DESYREL) tablet 100 mg  100 mg Oral QHS Clapacs, [name], MD   100 mg at 06/24/21 2125  ? traZODone (DESYREL) tablet 100 mg  100 mg Oral QHS PRN Clapacs, [name], MD      ? venlafaxine XR (EFFEXOR-XR) 24 hr capsule  112.5 mg  112.5 mg Oral Q breakfast Clapacs, [name], MD   112.5 mg at 06/25/21 0806  ? Vitamin D (Ergocalciferol) (DRISDOL) capsule 50,000 Units  50,000 Units Oral Q7 days Clapacs, [name], MD      ? ? ?Lab Results:  ?Results for orders placed or performed during the hospital encounter of 06/07/21 (from the past 48 hour(s))  ?Resp Panel by RT-PCR (Flu A&B, Covid) Nasopharyngeal Swab     Status: None  ? Collection Time: 06/23/21  1:39 PM  ? Specimen: Nasopharyngeal Swab; Nasopharyngeal(NP) swabs in vial transport medium  ?Result Value Ref Range  ? SARS Coronavirus 2 by RT PCR NEGATIVE NEGATIVE  ?  Comment: (NOTE) ?SARS-CoV-2 target nucleic acids are NOT DETECTED. ? ?The SARS-CoV-2 RNA is generally detectable in upper respiratory ?specimens during the acute phase of infection. The lowest ?concentration of SARS-CoV-2 viral copies this assay can detect is ?138 copies/mL. A negative result does not preclude SARS-Cov-2 ?infection and should not be used as the sole basis for treatment or ?other patient management decisions. A negative result may occur with  ?improper specimen collection/handling, submission of specimen other ?than nasopharyngeal swab, presence of viral mutation(s) within the ?areas targeted by this assay, and inadequate number of viral ?copies(<138 copies/mL). A negative result must be combined with ?clinical observations, patient history, and epidemiological ?information. The expected result is Negative. ? ?Fact Sheet for Patients:  ?EntrepreneurPulse.com.au ? ?Fact Sheet for Healthcare Providers:  ?IncredibleEmployment.be ? ?This test is no t yet approved or cleared by the [place] FDA and  ?has been authorized for detection and/or diagnosis of SARS-CoV-2 by ?FDA under an Emergency Use Authorization (EUA). This EUA will remain  ?in effect (meaning this test can be used) for the duration of the ?COVID-19 declaration under Section 564(b)(1) of the Act,  21 ?U.S.C.section 360bbb-3(b)(1), unless the authorization is terminated  ?or revoked sooner.  ? ? ?  ? Influenza A by PCR NEGATIVE NEGATIVE  ? Influenza B by PCR NEGATIVE NEGATIVE  ?  Comment: (NOTE) ?The Xpert Xpress SARS-CoV-2/FLU/RSV plus assay is intended as an aid ?in the diagnosis of influenza from Nasopharyngeal swab specimens and ?should not be used as a sole basis for treatment. Nasal washings and ?aspirates are unacceptable for Xpert Xpress SARS-CoV-2/FLU/RSV ?testing. ? ?Fact Sheet for Patients: ?EntrepreneurPulse.com.au ? ?Fact Sheet for Healthcare Providers: ?IncredibleEmployment.be ? ?This test is not yet approved  or cleared by the [place] and ?has been authorized for detection and/or diagnosis of SARS-CoV-2 by ?FDA under an Emergency Use Authorization (EUA). This EUA will remain ?in effect (meaning this test can be used) for the duration of the ?COVID-19 declaration under Section 564(b)(1) of the Act, 21 U.S.C. ?section 360bbb-3(b)(1), unless the authorization is terminated or ?revoked. ? ?Performed at [facility], [address] ?[place], [place] [address] ?  ? ? ?Blood Alcohol level:  ?Lab Results  ?Component Value Date  ? ETH <10 06/07/2021  ? ETH <10 11/22/2020  ? ? ?Metabolic Disorder Labs: ?Lab Results  ?Component Value Date  ? HGBA1C 5.1 06/07/2021  ? MPG 99.67 06/07/2021  ? MPG 111.15 11/24/2020  ? ?No results found for: PROLACTIN ?Lab Results  ?Component Value Date  ? CHOL 197 06/07/2021  ? TRIG 149 06/07/2021  ? HDL 44 06/07/2021  ? CHOLHDL 4.5 06/07/2021  ? VLDL 30 06/07/2021  ? LDLCALC 123 (H) 06/07/2021  ? LDLCALC 148 (H) 11/24/2020  ? ? ?Physical Findings: ?AIMS:  , ,  ,  ,    ?CIWA:    ?COWS:    ? ?Musculoskeletal: ?Strength & Muscle Tone: within normal limits ?Gait & Station: normal ?Patient leans: N/A ? ?Psychiatric Specialty Exam: ? ?Presentation  ?General Appearance: Appropriate for Environment; Bizarre; Disheveled;  Fairly Groomed ? ?Eye Contact:Good ? ?Speech:Clear and Coherent; Normal Rate ? ?Speech Volume:Normal ? ?Handedness:Right ? ? ?Mood and Affect  ?Mood:Anxious; Depressed; Hopeless; Irritable ? ?Affect:Blunt; Depressed ?

## 2021-06-26 NOTE — Progress Notes (Signed)
Patient alert and oriented x 4, no distress, he appears flat but brightens upon approach he made eye contact and forward very little information, but denies SI/HI/AVH . Patient was seen in the evening interacting briefly with peers and staff appropriately. Patient was given scheduled medication pass, offered emotional support, 15 minutes safety checks maintained will continue to monitor closely.  ?

## 2021-06-26 NOTE — Plan of Care (Addendum)
Assessed Pt in medication room. Reports passive SI without a plan , verbalizes understanding of providing staff with updates in thoughts increase. Pt denies HI / AVH. Pt denies depression and anxiety at this time. Goal for today was to talk with his doctor. Pt is provided with education and adheres to scheduled medication. Staff will continue to monitor.  ? ? ?Problem: Education: ?Goal: Utilization of techniques to improve thought processes will improve ?Outcome: Not Progressing ?Goal: Knowledge of the prescribed therapeutic regimen will improve ?Outcome: Not Progressing ?  ?Problem: Activity: ?Goal: Interest or engagement in leisure activities will improve ?Outcome: Not Progressing ?Goal: Imbalance in normal sleep/wake cycle will improve ?Outcome: Not Progressing ?  ?Problem: Coping: ?Goal: Will verbalize feelings ?Outcome: Progressing ?  ?

## 2021-06-26 NOTE — Progress Notes (Signed)
[facility] MD Progress Note ? ?06/26/2021 2:32 PM ?Casey Silva  ?MRN:  [id number] ?Subjective:  pt seen and chart reviewed.  ?Casey Silva reports some mild diarrhea, otherwise, in fair mood, and continue to agree to do ECT tomorrow.  No SI or Hi  ? ?Casey Silva tolerated the current meds but only partially responsive.   ? ?Principal Problem: Severe recurrent major depression with psychotic features ([place]) ?Diagnosis: Principal Problem: ?  Severe recurrent major depression with psychotic features ([place]) ?Active Problems: ?  Primary insomnia ?  GAD (generalized anxiety disorder) ?  Essential hypertension ? ?Total Time spent with patient: 20 minutes ? ?Past Psychiatric History: Past history of chronic anxiety and depression.  Casey Silva has had 2 prior psychiatric hospitalizations in his life.  No history of ECT.  At least 1 serious suicide attempt in the past. ? ?Past Medical History:  ?Past Medical History:  ?Diagnosis Date  ? Anxiety   ? Asthma   ? Depression   ? History reviewed. No pertinent surgical history. ?Family History:  ?Family History  ?Problem Relation Age of Onset  ? Bipolar disorder Mother   ? ?Family Psychiatric  History: mom has bipolar ?Social History:  ?Social History  ? ?Substance and Sexual Activity  ?Alcohol Use No  ?   ?Social History  ? ?Substance and Sexual Activity  ?Drug Use No  ?  ?Social History  ? ?Socioeconomic History  ? Marital status: Single  ?  Spouse name: Not on file  ? Number of children: 1  ? Years of education: Not on file  ? Highest education level: GED or equivalent  ?Occupational History  ? Not on file  ?Tobacco Use  ? Smoking status: Never  ? Smokeless tobacco: Never  ?Vaping Use  ? Vaping Use: Unknown  ?Substance and Sexual Activity  ? Alcohol use: No  ? Drug use: No  ? Sexual activity: Not Currently  ?Other Topics Concern  ? Not on file  ?Social History Narrative  ? Marital status:  Single   Children: Daughter   Employment:  Unemployment. previous work for [employer].   Alcohol:  None   Drugs:  none    Exercise:  Regularly very   Religion:  Jewish  ? ?Social Determinants of Health  ? ?Financial Resource Strain: Not on file  ?Food Insecurity: Not on file  ?Transportation Needs: Not on file  ?Physical Activity: Not on file  ?Stress: Not on file  ?Social Connections: Not on file  ? ?Additional Social History:  ?  ? ?Sleep: Fair ? ?Appetite:  Fair ? ?Current Medications: ?Current Facility-Administered Medications  ?Medication Dose Route Frequency Provider Last Rate Last Admin  ? amLODipine (NORVASC) tablet 10 mg  10 mg Oral Daily Clapacs, [name], MD   10 mg at 06/26/21 7408  ? diclofenac Sodium (VOLTAREN) 1 % topical gel 2 g  2 g Topical QID Clapacs, [name], MD      ? feeding supplement (ENSURE ENLIVE / ENSURE PLUS) liquid 237 mL  237 mL Oral BID BM Clapacs, [name], MD   237 mL at 06/26/21 1400  ? gabapentin (NEURONTIN) capsule 400 mg  400 mg Oral TID Clapacs, [name], MD   400 mg at 06/26/21 1148  ? hydrOXYzine (ATARAX) tablet 50 mg  50 mg Oral Q6H PRN Clapacs, [name], MD   50 mg at 06/25/21 2209  ? melatonin tablet 5 mg  5 mg Oral QHS Clapacs, [name], MD   5 mg at 06/25/21 2209  ?  mirtazapine (REMERON) tablet 30 mg  30 mg Oral QHS Clapacs, [name], MD   30 mg at 06/25/21 2209  ? multivitamin with minerals tablet 1 tablet  1 tablet Oral Daily Clapacs, [name], MD   1 tablet at 06/26/21 3007  ? ondansetron (ZOFRAN-ODT) disintegrating tablet 4 mg  4 mg Oral Q4H PRN Clapacs, [name], MD      ? propranolol (INDERAL) tablet 10 mg  10 mg Oral TID Clapacs, [name], MD   10 mg at 06/26/21 6226  ? QUEtiapine (SEROQUEL) tablet 100 mg  100 mg Oral QHS Clapacs, [name], MD   100 mg at 06/25/21 2209  ? QUEtiapine (SEROQUEL) tablet 25 mg  25 mg Oral BID Clapacs, [name], MD   25 mg at 06/26/21 3335  ? traZODone (DESYREL) tablet 100 mg  100 mg Oral QHS Clapacs, [name], MD   100 mg at 06/24/21 2125  ? traZODone (DESYREL) tablet 100 mg  100 mg Oral QHS PRN Clapacs, [name], MD   100 mg at 06/25/21 2209  ? venlafaxine XR (EFFEXOR-XR) 24 hr  capsule 112.5 mg  112.5 mg Oral Q breakfast Clapacs, [name], MD   112.5 mg at 06/26/21 4562  ? Vitamin D (Ergocalciferol) (DRISDOL) capsule 50,000 Units  50,000 Units Oral Q7 days Clapacs, [name], MD   50,000 Units at 06/25/21 1704  ? ? ?Lab Results:  ?No results found for this or any previous visit (from the past 48 hour(s)). ? ? ?Blood Alcohol level:  ?Lab Results  ?Component Value Date  ? ETH <10 06/07/2021  ? ETH <10 11/22/2020  ? ? ?Metabolic Disorder Labs: ?Lab Results  ?Component Value Date  ? HGBA1C 5.1 06/07/2021  ? MPG 99.67 06/07/2021  ? MPG 111.15 11/24/2020  ? ?No results found for: PROLACTIN ?Lab Results  ?Component Value Date  ? CHOL 197 06/07/2021  ? TRIG 149 06/07/2021  ? HDL 44 06/07/2021  ? CHOLHDL 4.5 06/07/2021  ? VLDL 30 06/07/2021  ? LDLCALC 123 (H) 06/07/2021  ? LDLCALC 148 (H) 11/24/2020  ? ? ?Physical Findings: ?AIMS:  , ,  ,  ,    ?CIWA:    ?COWS:    ? ?Musculoskeletal: ?Strength & Muscle Tone: within normal limits ?Gait & Station: normal ?Patient leans: N/A ? ?Psychiatric Specialty Exam: ? ?Presentation  ?General Appearance: Appropriate for Environment; Bizarre; Disheveled; Fairly Groomed ? ?Eye Contact:Good ? ?Speech:Clear and Coherent; Normal Rate ? ?Speech Volume:Normal ? ?Handedness:Right ? ? ?Mood and Affect  ?Mood:Anxious; Depressed; Hopeless; Irritable ? ?Affect:Blunt; Depressed ? ? ?Thought Process  ?Thought Processes:Coherent; Linear ? ?Descriptions of Associations:Intact ? ?Orientation:Full (Time, Place and Person) ? ?Thought Content:Logical; WDL ? ?History of Schizophrenia/Schizoaffective disorder:No ? ?Duration of Psychotic Symptoms:N/A ? ?Hallucinations:No data recorded ?Ideas of Reference:None ? ?Suicidal Thoughts:No data recorded ?Homicidal Thoughts:No data recorded ? ?Sensorium  ?Memory:Immediate Fair; Recent Fair; Remote Fair ? ?Judgment:Fair ? ?Insight:Fair ? ? ?Executive Functions  ?Concentration:Fair ? ?Attention Span:Good ? ?Recall:Good ? ?Casey Silva ? ?Language:Good ? ? ?Psychomotor Activity  ?Psychomotor Activity:No data recorded ? ?Assets  ?Assets:Communication Skills; Physical Health; Social Support ? ? ?Sleep  ?Sleep:No data recorded ? ? ?Physical Exam: ?Physical Exam ?Psychiatric:     ?   Attention and Perception: Casey Silva is inattentive.     ?   Mood and Affect: Mood is depressed.     ?   Speech: Speech normal.     ?   Behavior: Behavior normal.     ?  Thought Content: Thought content normal.     ?   Cognition and Memory: Cognition normal.     ?   Judgment: Judgment normal.  ? ?ROS ?Blood pressure 105/74, pulse (!) 49, temperature 97.7 ?F (36.5 ?C), temperature source Oral, resp. rate 18, height '5\' 6"'$  (1.676 m), weight 76.2 kg, SpO2 98 %. Body mass index is 27.12 kg/m?. ? ? ?Treatment Plan Summary: ?Daily contact with patient to assess and evaluate symptoms and progress in treatment and Medication management ? ?MDD with psychotic features ?-- plan to start ECT on Monday.  ?-- continue current meds: remeron, effexor and seroquel.  ? ?Casey Hissong, MD ?06/26/2021, 2:32 PM ? ?

## 2021-06-26 NOTE — Group Note (Signed)
LCSW Group Therapy Note ? ?Group Date: 06/26/2021 ?Start Time: 1315 ?End Time: 0981 ? ? ?Type of Therapy and Topic:  Group Therapy - Healthy vs Unhealthy Coping Skills ? ?Participation Level:  Did Not Attend  ? ?Description of Group ?The focus of this group was to determine what unhealthy coping techniques typically are used by group members and what healthy coping techniques would be helpful in coping with various problems. Patients were guided in becoming aware of the differences between healthy and unhealthy coping techniques. Patients were asked to identify 2-3 healthy coping skills they would like to learn to use more effectively. ? ?Therapeutic Goals ?Patients learned that coping is what human beings do all day long to deal with various situations in their lives ?Patients defined and discussed healthy vs unhealthy coping techniques ?Patients identified their preferred coping techniques and identified whether these were healthy or unhealthy ?Patients determined 2-3 healthy coping skills they would like to become more familiar with and use more often. ?Patients provided support and ideas to each other ? ? ?Summary of Patient Progress:  Due to limited staffing, group was not held on the unit.  ? ? ?Therapeutic Modalities ?Cognitive Behavioral Therapy ?Motivational Interviewing ? ?Kenna Gilbert Deago Burruss, LCSWA ?06/26/2021  3:18 PM   ?

## 2021-06-27 ENCOUNTER — Encounter: Payer: Self-pay | Admitting: Psychiatry

## 2021-06-27 ENCOUNTER — Ambulatory Visit: Payer: 59

## 2021-06-27 ENCOUNTER — Inpatient Hospital Stay: Payer: 59 | Admitting: Certified Registered"

## 2021-06-27 LAB — GLUCOSE, CAPILLARY: Glucose-Capillary: 85 mg/dL (ref 70–99)

## 2021-06-27 MED ORDER — IBUPROFEN 600 MG PO TABS
600.0000 mg | ORAL_TABLET | Freq: Four times a day (QID) | ORAL | Status: DC | PRN
  Administered 2021-06-27: 600 mg via ORAL
  Filled 2021-06-27: qty 1

## 2021-06-27 MED ORDER — METHOHEXITAL SODIUM 100 MG/10ML IV SOSY
PREFILLED_SYRINGE | INTRAVENOUS | Status: DC | PRN
  Administered 2021-06-27: 80 mg via INTRAVENOUS

## 2021-06-27 MED ORDER — SUCCINYLCHOLINE CHLORIDE 200 MG/10ML IV SOSY
PREFILLED_SYRINGE | INTRAVENOUS | Status: DC | PRN
  Administered 2021-06-27: 100 mg via INTRAVENOUS

## 2021-06-27 MED ORDER — LABETALOL HCL 5 MG/ML IV SOLN
INTRAVENOUS | Status: DC | PRN
  Administered 2021-06-27: 5 mg via INTRAVENOUS

## 2021-06-27 MED ORDER — METHOHEXITAL SODIUM 0.5 G IJ SOLR
INTRAMUSCULAR | Status: AC
  Filled 2021-06-27: qty 500

## 2021-06-27 MED ORDER — SODIUM CHLORIDE 0.9 % IV SOLN
500.0000 mL | Freq: Once | INTRAVENOUS | Status: AC
  Administered 2021-06-29: 500 mL via INTRAVENOUS

## 2021-06-27 MED ORDER — GLYCOPYRROLATE 0.2 MG/ML IJ SOLN: 0.1000 mg | Freq: Once | INTRAMUSCULAR | Status: AC

## 2021-06-27 NOTE — H&P (Signed)
Casey Silva is an 66 y.o. male.   ?Chief Complaint: Patient complaining of chronic depression with past suicidal thoughts ?HPI: History of recurrent severe depression unresponsive to recent medication trials ? ?Past Medical History:  ?Diagnosis Date  ? Anxiety   ? Asthma   ? Depression   ? ? ?History reviewed. No pertinent surgical history. ? ?Family History  ?Problem Relation Age of Onset  ? Bipolar disorder Mother   ? ?Social History:  reports that he has never smoked. He has never used smokeless tobacco. He reports that he does not drink alcohol and does not use drugs. ? ?Allergies:  ?Allergies  ?Allergen Reactions  ? Dust Mite Mixed Allergen Ext [Mite (D. Farinae)] Shortness Of Breath  ? Bee Pollen Other (See Comments)  ?  Watery Eyes  ? ? ?Medications Prior to Admission  ?Medication Sig Dispense Refill  ? amLODipine (NORVASC) 10 MG tablet Take 1 tablet (10 mg total) by mouth daily. 30 tablet 0  ? gabapentin (NEURONTIN) 400 MG capsule Take 1 capsule (400 mg total) by mouth 3 (three) times daily. 90 capsule 0  ? LORazepam (ATIVAN) 0.5 MG tablet Take 1 tablet (0.5 mg total) by mouth 2 (two) times daily as needed for anxiety. 30 tablet 0  ? melatonin 5 MG TABS Take 1 tablet (5 mg total) by mouth at bedtime. 30 tablet 0  ? mirtazapine (REMERON) 30 MG tablet Take 1 tablet (30 mg total) by mouth at bedtime. 30 tablet 3  ? propranolol (INDERAL) 10 MG tablet Take 1 tablet (10 mg total) by mouth 3 (three) times daily. 90 tablet 0  ? QUEtiapine (SEROQUEL) 100 MG tablet Take 1 tablet (100 mg total) by mouth at bedtime. 30 tablet 0  ? QUEtiapine (SEROQUEL) 25 MG tablet Take 1 tablet (25 mg total) by mouth 2 (two) times daily. 60 tablet 0  ? traZODone (DESYREL) 100 MG tablet Take 1 tablet (100 mg total) by mouth at bedtime. 30 tablet 0  ? venlafaxine XR (EFFEXOR-XR) 37.5 MG 24 hr capsule Take 3 capsules (112.5 mg total) by mouth daily with breakfast. 30 capsule 0  ? Vitamin D, Ergocalciferol, (DRISDOL) 1.25 MG (50000 UNIT)  CAPS capsule Take 1 capsule (50,000 Units total) by mouth every 7 (seven) days. 5 capsule 0  ? ? ?No results found for this or any previous visit (from the past 48 hour(s)). ?No results found. ? ?Review of Systems  ?Constitutional: Negative.   ?HENT: Negative.    ?Eyes: Negative.   ?Respiratory: Negative.    ?Cardiovascular: Negative.   ?Gastrointestinal: Negative.   ?Musculoskeletal: Negative.   ?Skin: Negative.   ?Neurological: Negative.   ?Psychiatric/Behavioral:  Positive for dysphoric mood and suicidal ideas. The patient is nervous/anxious.   ?All other systems reviewed and are negative. ? ?Blood pressure 122/88, pulse 64, temperature (!) 97.4 ?F (36.3 ?C), temperature source Oral, resp. rate 18, height '5\' 6"'$  (1.676 m), weight 76.2 kg, SpO2 100 %. ?Physical Exam ?Vitals and nursing note reviewed.  ?Constitutional:   ?   Appearance: He is well-developed.  ?HENT:  ?   Head: Normocephalic and atraumatic.  ?Eyes:  ?   Conjunctiva/sclera: Conjunctivae normal.  ?   Pupils: Pupils are equal, round, and reactive to light.  ?Cardiovascular:  ?   Heart sounds: Normal heart sounds.  ?Pulmonary:  ?   Effort: Pulmonary effort is normal.  ?Abdominal:  ?   Palpations: Abdomen is soft.  ?Musculoskeletal:     ?   General: Normal range of motion.  ?  Cervical back: Normal range of motion.  ?Skin: ?   General: Skin is warm and dry.  ?Neurological:  ?   General: No focal deficit present.  ?   Mental Status: He is alert.  ?Psychiatric:     ?   Attention and Perception: Attention normal.     ?   Mood and Affect: Mood is depressed. Affect is blunt.     ?   Speech: Speech is delayed.     ?   Behavior: Behavior is slowed.     ?   Thought Content: Thought content includes suicidal ideation. Thought content does not include suicidal plan.     ?   Cognition and Memory: Cognition is impaired.  ?  ? ?Assessment/Plan ?Plan is to initiate bilateral ECT today for treatment of severe depression ? ?Alethia Berthold, MD ?06/27/2021, 7:22 AM ? ? ? ?

## 2021-06-27 NOTE — Progress Notes (Signed)
[facility] MD Progress Note ? ?06/27/2021 3:47 PM ?Casey Silva  ?MRN:  [id number] ?Subjective: Patient seen and chart reviewed.  Patient seen both before and after ECT treatment.  66 year old man with severe recurrent depression had first ECT treatment today.  Right unilateral treatment was provided.  Patient had some confusion waking up some mild aches and pains and headaches afterwards.  Continues to complain of depression and hopelessness.  No behavior problems acutely. ?Principal Problem: Severe recurrent major depression with psychotic features ([place]) ?Diagnosis: Principal Problem: ?  Severe recurrent major depression with psychotic features ([place]) ?Active Problems: ?  Primary insomnia ?  GAD (generalized anxiety disorder) ?  Essential hypertension ? ?Total Time spent with patient: 30 minutes ? ?Past Psychiatric History: Past history of recurrent severe major depression ? ?Past Medical History:  ?Past Medical History:  ?Diagnosis Date  ? Anxiety   ? Asthma   ? Depression   ? History reviewed. No pertinent surgical history. ?Family History:  ?Family History  ?Problem Relation Age of Onset  ? Bipolar disorder Mother   ? ?Family Psychiatric  History: See previous ?Social History:  ?Social History  ? ?Substance and Sexual Activity  ?Alcohol Use No  ?   ?Social History  ? ?Substance and Sexual Activity  ?Drug Use No  ?  ?Social History  ? ?Socioeconomic History  ? Marital status: Single  ?  Spouse name: Not on file  ? Number of children: 1  ? Years of education: Not on file  ? Highest education level: GED or equivalent  ?Occupational History  ? Not on file  ?Tobacco Use  ? Smoking status: Never  ? Smokeless tobacco: Never  ?Vaping Use  ? Vaping Use: Unknown  ?Substance and Sexual Activity  ? Alcohol use: No  ? Drug use: No  ? Sexual activity: Not Currently  ?Other Topics Concern  ? Not on file  ?Social History Narrative  ? Marital status:  Single   Children: Daughter   Employment:  Unemployment. previous work for [employer].   Alcohol:  None   Drugs:  none   Exercise:  Regularly very   Religion:  Jewish  ? ?Social Determinants of Health  ? ?Financial Resource Strain: Not on file  ?Food Insecurity: Not on file  ?Transportation Needs: Not on file  ?Physical Activity: Not on file  ?Stress: Not on file  ?Social Connections: Not on file  ? ?Additional Social History:  ?  ?  ?  ?  ?  ?  ?  ?  ?  ?  ?  ? ?Sleep: Fair ? ?Appetite:  Fair ? ?Current Medications: ?Current Facility-Administered Medications  ?Medication Dose Route Frequency Provider Last Rate Last Admin  ? 0.9 %  sodium chloride infusion  500 mL Intravenous Once Krikor Willet T, MD      ? amLODipine (NORVASC) tablet 10 mg  10 mg Oral Daily Kimi Kroft T, MD   10 mg at 06/27/21 0900  ? diclofenac Sodium (VOLTAREN) 1 % topical gel 2 g  2 g Topical QID Lorry Furber, [name], MD   2 g at 06/27/21 1232  ? feeding supplement (ENSURE ENLIVE / ENSURE PLUS) liquid 237 mL  237 mL Oral BID BM Waleska Buttery, [name], MD   237 mL at 06/27/21 0949  ? gabapentin (NEURONTIN) capsule 400 mg  400 mg Oral TID Maedell Hedger, [name], MD   400 mg at 06/27/21 1212  ? glycopyrrolate (ROBINUL) injection 0.1 mg  0.1 mg Intravenous Once Mikiah Durall,  [name], MD      ? hydrOXYzine (ATARAX) tablet 50 mg  50 mg Oral Q6H PRN Marybel Alcott, [name], MD   50 mg at 06/25/21 2209  ? ibuprofen (ADVIL) tablet 600 mg  600 mg Oral Q6H PRN Lurdes Haltiwanger, [name], MD   600 mg at 06/27/21 0915  ? melatonin tablet 5 mg  5 mg Oral QHS Darryon Bastin, [name], MD   5 mg at 06/26/21 2118  ? mirtazapine (REMERON) tablet 30 mg  30 mg Oral QHS Alyzza Andringa T, MD   30 mg at 06/26/21 2121  ? multivitamin with minerals tablet 1 tablet  1 tablet Oral Daily Jenesys Casseus, [name], MD   1 tablet at 06/27/21 0900  ? ondansetron (ZOFRAN-ODT) disintegrating tablet 4 mg  4 mg Oral Q4H PRN Annelyse Rey, [name], MD      ? propranolol (INDERAL) tablet 10 mg  10 mg Oral TID Tramon Crescenzo, [name], MD   10 mg at 06/27/21 0900  ? QUEtiapine (SEROQUEL) tablet 100 mg  100 mg Oral QHS Trigo Winterbottom, [name],  MD   100 mg at 06/26/21 2118  ? QUEtiapine (SEROQUEL) tablet 25 mg  25 mg Oral BID Dossie Swor, [name], MD   25 mg at 06/27/21 0900  ? traZODone (DESYREL) tablet 100 mg  100 mg Oral QHS Daschel Roughton T, MD   100 mg at 06/26/21 2119  ? traZODone (DESYREL) tablet 100 mg  100 mg Oral QHS PRN Antrice Pal, [name], MD   100 mg at 06/25/21 2209  ? venlafaxine XR (EFFEXOR-XR) 24 hr capsule 112.5 mg  112.5 mg Oral Q breakfast Jozeph Persing T, MD   112.5 mg at 06/27/21 0900  ? Vitamin D (Ergocalciferol) (DRISDOL) capsule 50,000 Units  50,000 Units Oral Q7 days Bowdy Bair, [name], MD   50,000 Units at 06/25/21 1704  ? ? ?Lab Results:  ?Results for orders placed or performed during the hospital encounter of 06/23/21 (from the past 48 hour(s))  ?Glucose, capillary     Status: None  ? Collection Time: 06/27/21  6:52 AM  ?Result Value Ref Range  ? Glucose-Capillary 85 70 - 99 mg/dL  ?  Comment: Glucose reference range applies only to samples taken after fasting for at least 8 hours.  ? Comment 1 Notify RN   ? ? ?Blood Alcohol level:  ?Lab Results  ?Component Value Date  ? ETH <10 06/07/2021  ? ETH <10 11/22/2020  ? ? ?Metabolic Disorder Labs: ?Lab Results  ?Component Value Date  ? HGBA1C 5.1 06/07/2021  ? MPG 99.67 06/07/2021  ? MPG 111.15 11/24/2020  ? ?No results found for: PROLACTIN ?Lab Results  ?Component Value Date  ? CHOL 197 06/07/2021  ? TRIG 149 06/07/2021  ? HDL 44 06/07/2021  ? CHOLHDL 4.5 06/07/2021  ? VLDL 30 06/07/2021  ? LDLCALC 123 (H) 06/07/2021  ? LDLCALC 148 (H) 11/24/2020  ? ? ?Physical Findings: ?AIMS:  , ,  ,  ,    ?CIWA:    ?COWS:    ? ?Musculoskeletal: ?Strength & Muscle Tone: within normal limits ?Gait & Station: normal ?Patient leans: N/A ? ?Psychiatric Specialty Exam: ? ?Presentation  ?General Appearance: Appropriate for Environment; Bizarre; Disheveled; Fairly Groomed ? ?Eye Contact:Good ? ?Speech:Clear and Coherent; Normal Rate ? ?Speech Volume:Normal ? ?Handedness:Right ? ? ?Mood and Affect  ?Mood:Anxious;  Depressed; Hopeless; Irritable ? ?Affect:Blunt; Depressed ? ? ?Thought Process  ?Thought Processes:Coherent; Linear ? ?Descriptions of Associations:Intact ? ?Orientation:Full (Time, Place and Person) ? ?Thought Content:Logical;  WDL ? ?History of Schizophrenia/Schizoaffective disorder:No ? ?Duration of Psychotic Symptoms:N/A ? ?Hallucinations:No data recorded ?Ideas of Reference:None ? ?Suicidal Thoughts:No data recorded ?Homicidal Thoughts:No data recorded ? ?Sensorium  ?Memory:Immediate Fair; Recent Fair; Remote Fair ? ?Judgment:Fair ? ?Insight:Fair ? ? ?Executive Functions  ?Concentration:Fair ? ?Attention Span:Good ? ?Recall:Good ? ?[place] ? ?Language:Good ? ? ?Psychomotor Activity  ?Psychomotor Activity:No data recorded ? ?Assets  ?Assets:Communication Skills; Physical Health; Social Support ? ? ?Sleep  ?Sleep:No data recorded ? ? ?Physical Exam: ?Physical Exam ?Vitals reviewed.  ?Constitutional:   ?   Appearance: Normal appearance.  ?HENT:  ?   Head: Normocephalic and atraumatic.  ?   Mouth/Throat:  ?   Pharynx: Oropharynx is clear.  ?Eyes:  ?   Pupils: Pupils are equal, round, and reactive to light.  ?Cardiovascular:  ?   Rate and Rhythm: Normal rate and regular rhythm.  ?Pulmonary:  ?   Effort: Pulmonary effort is normal.  ?   Breath sounds: Normal breath sounds.  ?Abdominal:  ?   General: Abdomen is flat.  ?   Palpations: Abdomen is soft.  ?Musculoskeletal:     ?   General: Normal range of motion.  ?Skin: ?   General: Skin is warm and dry.  ?Neurological:  ?   General: No focal deficit present.  ?   Mental Status: He is alert. Mental status is at baseline.  ?Psychiatric:     ?   Attention and Perception: He is inattentive.     ?   Mood and Affect: Mood is depressed. Affect is blunt.     ?   Speech: Speech is delayed.     ?   Behavior: Behavior is slowed.     ?   Thought Content: Thought content normal.  ? ?Review of Systems  ?Constitutional: Negative.   ?HENT: Negative.    ?Eyes: Negative.    ?Respiratory: Negative.    ?Cardiovascular: Negative.   ?Gastrointestinal: Negative.   ?Musculoskeletal: Negative.   ?Skin: Negative.   ?Neurological: Negative.   ?Psychiatric/Behavioral:  Positive for depr

## 2021-06-27 NOTE — Anesthesia Postprocedure Evaluation (Signed)
Anesthesia Post Note ? ?Patient: Lucero Ide ? ?Procedure(s) Performed: ECT TX ? ?Patient location during evaluation: PACU ?Anesthesia Type: General ?Level of consciousness: awake and alert ?Pain management: pain level controlled ?Vital Signs Assessment: post-procedure vital signs reviewed and stable ?Respiratory status: spontaneous breathing, nonlabored ventilation, respiratory function stable and patient connected to nasal cannula oxygen ?Cardiovascular status: blood pressure returned to baseline and stable ?Postop Assessment: no apparent nausea or vomiting ?Anesthetic complications: no ? ? ?No notable events documented. ? ? ?Last Vitals:  ?Vitals:  ? 06/27/21 0845 06/27/21 0909  ?BP: 102/72 116/80  ?Pulse: 77 76  ?Resp: (!) 23 18  ?Temp:  36.7 ?C  ?SpO2: 95% 95%  ?  ?Last Pain:  ?Vitals:  ? 06/27/21 0909  ?TempSrc: Oral  ?PainSc:   ? ? ?  ?  ?  ?  ?  ?  ? ?Precious Haws Yazleen Molock ? ? ? ? ?

## 2021-06-27 NOTE — Progress Notes (Signed)
Patient calm and pleasant during assessment denying HI/AVH. Pt endorses SI, verbally contracts for safety. Patient stated he felt better after having ECT today. Patient compliant with medication administration per MD orders. Pt given education, support, and encouragement to be active in his treatment plan. Pt being monitored Q 15 minutes for safety per unit protocol. Pt remains safe on the unit.  ?

## 2021-06-27 NOTE — Procedures (Signed)
ECT SERVICES ?Physician?s Interval Evaluation & Treatment Note ? ?Patient Identification: Casey Silva ?MRN:  [id number] ?Date of Evaluation:  06/27/2021 ?[place] #: 1 ? ?MADRS:  ? ?MMSE:  ? ?P.E. Findings: ? ?Disheveled but otherwise unremarkable physical exam ? ?Psychiatric Interval Note: ? ?Depressed and anxious ? ?Subjective: ? ?Patient is a 66 y.o. male seen for evaluation for Electroconvulsive Therapy. ?Depressed mood ? ?Treatment Summary: ? ? '[]'$   Right Unilateral             '[x]'$  Bilateral ?  ?% Energy : 1.0 ms 70% after a second stimulus.  For stimulus did not produce a seizure ? ? Impedance: 950 ohms ? ?Seizure Energy Index: 6415 ?V squared ? ?Postictal Suppression Index: 68% ? ?Seizure Concordance Index: 80% ? ?Medications ? ?Pre Shock: Brevital 80 mg succinylcholine 100 mg ? ?Post Shock:   ? ?Seizure Duration: 20 seconds EMG 20 seconds EEG ? ? ?Comments: ?We will go to 100% next time and if this is an adequate he will need to switch to ketamine ? ?Lungs: ? ?'[x]'$   Clear to auscultation              ? ?'[]'$  Other:  ? ?Heart: ?  ? '[x]'$   Regular rhythm             '[]'$  irregular rhythm ? ? ? '[x]'$   Previous H&P reviewed, patient examined and there are NO CHANGES              ?  ? '[]'$   Previous H&P reviewed, patient examined and there are changes noted. ? ? ?Alethia Berthold, MD ?3/20/20234:29 PM ? ?  ?

## 2021-06-27 NOTE — Progress Notes (Signed)
Patient alert and oriented x 4, no distress, he appears flat and blunted, forwards very little information, but denies SI/HI/AVH . Patient did not interact with peers and staff rather he isolated in his room except med and snack time Patient was given scheduled medication pass, offered emotional support, 15 minutes safety checks maintained will continue  ?

## 2021-06-27 NOTE — Anesthesia Preprocedure Evaluation (Signed)
Anesthesia Evaluation  ?Patient identified by MRN, date of birth, ID band ?Patient awake ? ? ? ?Reviewed: ?Allergy & Precautions, NPO status , Patient's Chart, lab work & pertinent test results ? ?History of Anesthesia Complications ?Negative for: history of anesthetic complications ? ?Airway ?Mallampati: III ? ?TM Distance: >3 FB ?Neck ROM: full ? ? ? Dental ? ?(+) Chipped, Poor Dentition, Missing ?  ?Pulmonary ?asthma , COPD,  ?  ?Pulmonary exam normal ? ? ? ? ? ? ? Cardiovascular ?Exercise Tolerance: Good ?hypertension, Normal cardiovascular exam ? ? ?  ?Neuro/Psych ?PSYCHIATRIC DISORDERS  Neuromuscular disease   ? GI/Hepatic ?negative GI ROS, Neg liver ROS,   ?Endo/Other  ?negative endocrine ROS ? Renal/GU ?negative Renal ROS  ?negative genitourinary ?  ?Musculoskeletal ? ? Abdominal ?  ?Peds ? Hematology ?negative hematology ROS ?(+)   ?Anesthesia Other Findings ?Past Medical History: ?No date: Anxiety ?No date: Asthma ?No date: Depression ? ?History reviewed. No pertinent surgical history. ? ?BMI   ? Body Mass Index: 27.12 kg/m?  ?  ? ? Reproductive/Obstetrics ?negative OB ROS ? ?  ? ? ? ? ? ? ? ? ? ? ? ? ? ?  ?  ? ? ? ? ? ? ? ? ?Anesthesia Physical ?Anesthesia Plan ? ?ASA: 3 ? ?Anesthesia Plan: General  ? ?Post-op Pain Management:   ? ?Induction: Intravenous ? ?PONV Risk Score and Plan: Propofol infusion and TIVA ? ?Airway Management Planned: Mask ? ?Additional Equipment:  ? ?Intra-op Plan:  ? ?Post-operative Plan:  ? ?Informed Consent: I have reviewed the patients History and Physical, chart, labs and discussed the procedure including the risks, benefits and alternatives for the proposed anesthesia with the patient or authorized representative who has indicated his/her understanding and acceptance.  ? ? ? ?Dental Advisory Given ? ?Plan Discussed with: Anesthesiologist, CRNA and Surgeon ? ?Anesthesia Plan Comments: (Patient consented for risks of anesthesia including but not  limited to:  ?- adverse reactions to medications ?- risk of airway placement if required ?- damage to eyes, teeth, lips or other oral mucosa ?- nerve damage due to positioning  ?- sore throat or hoarseness ?- Damage to heart, brain, nerves, lungs, other parts of body or loss of life ? ?Patient voiced understanding.)  ? ? ? ? ? ? ?Anesthesia Quick Evaluation ? ?

## 2021-06-27 NOTE — Group Note (Signed)
[place] LCSW Group Therapy Note ? ? ? ?Group Date: 06/27/2021 ?Start Time: 1300 ?End Time: 1400 ? ?Type of Therapy and Topic:  Group Therapy:  Overcoming Obstacles ? ?Participation Level:  [facility] PARTICIPATION LEVEL: Did Not Attend ? ?Mood: ? ?Description of Group:   ?In this group patients will be encouraged to explore what they see as obstacles to their own wellness and recovery. They will be guided to discuss their thoughts, feelings, and behaviors related to these obstacles. The group will process together ways to cope with barriers, with attention given to specific choices patients can make. Each patient will be challenged to identify changes they are motivated to make in order to overcome their obstacles. This group will be process-oriented, with patients participating in exploration of their own experiences as well as giving and receiving support and challenge from other group members. ? ?Therapeutic Goals: ?1. Patient will identify personal and current obstacles as they relate to admission. ?2. Patient will identify barriers that currently interfere with their wellness or overcoming obstacles.  ?3. Patient will identify feelings, thought process and behaviors related to these barriers. ?4. Patient will identify two changes they are willing to make to overcome these obstacles:  ? ? ?Summary of Patient Progress ? ? ?X ? ? ?Therapeutic Modalities:   ?Cognitive Behavioral Therapy ?Solution Focused Therapy ?Motivational Interviewing ?Relapse Prevention Therapy ? ? ?Rozann Lesches, LCSW ?

## 2021-06-27 NOTE — Transfer of Care (Signed)
Immediate Anesthesia Transfer of Care Note ? ?Patient: Casey Silva ? ?Procedure(s) Performed: ECT TX ? ?Patient Location: PACU ? ?Anesthesia Type:General ? ?Level of Consciousness: drowsy ? ?Airway & Oxygen Therapy: Patient Spontanous Breathing and Patient connected to nasal cannula oxygen ? ?Post-op Assessment: Report given to RN ? ?Post vital signs: stable ? ?Last Vitals:  ?Vitals Value Taken Time  ?BP 117/87 06/27/21 0818  ?Temp    ?Pulse 81 06/27/21 0819  ?Resp 18 06/27/21 0819  ?SpO2 97 % 06/27/21 0819  ?Vitals shown include unvalidated device data. ? ?Last Pain:  ?Vitals:  ? 06/27/21 0724  ?TempSrc:   ?PainSc: 0-No pain  ?   ? ?  ? ?Complications: No notable events documented. ?

## 2021-06-27 NOTE — Progress Notes (Signed)
Recreation Therapy Notes ? ?Date: 06/27/2021 ? ?Time: 10:50 am ? ?Location: Craft room  ? ?Behavioral response: N/A ? ?Intervention Topic: Self-care  ? ?Discussion/Intervention: ?Patient refused to attend group. ? ?Clinical Observations/Feedback: ?Patient refused to attend group. ? ?Carole Deere LRT/CTRS ? ? ? ? ? ? ? ?Allan Minotti ?06/27/2021 12:03 PM ?

## 2021-06-28 ENCOUNTER — Other Ambulatory Visit: Payer: Self-pay | Admitting: Psychiatry

## 2021-06-28 NOTE — Plan of Care (Signed)
Patient stayed in bed most of the shift stated that he feels tired. But after dinner patient had a shower and more interacting with staff. When asked for suicidal thoughts patient states " much less than before." Denies HI and AVH. Patient refused Tylenol for mild aches from ECT. Patient rated his his depression and anxiety 6/10.Appetite good. Suppoer and encouragement given. ?

## 2021-06-28 NOTE — Group Note (Signed)
LCSW Group Therapy Note ? ? ?Group Date: 06/28/2021 ?Start Time: 1300 ?End Time: 1400 ? ? ?Type of Therapy and Topic:  Group Therapy: Boundaries ? ?Participation Level:  Did Not Attend ? ?Description of Group: ?This group will address the use of boundaries in their personal lives. Patients will explore why boundaries are important, the difference between healthy and unhealthy boundaries, and negative and postive outcomes of different boundaries and will look at how boundaries can be crossed.  Patients will be encouraged to identify current boundaries in their own lives and identify what kind of boundary is being set. Facilitators will guide patients in utilizing problem-solving interventions to address and correct types boundaries being used and to address when no boundary is being used. Understanding and applying boundaries will be explored and addressed for obtaining and maintaining a balanced life. Patients will be encouraged to explore ways to assertively make their boundaries and needs known to significant others in their lives, using other group members and facilitator for role play, support, and feedback. ? ?Therapeutic Goals: ? ?1.  Patient will identify areas in their life where setting clear boundaries could be  used to improve their life.  ?2.  Patient will identify signs/triggers that a boundary is not being respected. ?3.  Patient will identify two ways to set boundaries in order to achieve balance in  their lives: ?4.  Patient will demonstrate ability to communicate their needs and set boundaries  through discussion and/or role plays ? ?Summary of Patient Progress: Patient did not attend. ? ?Therapeutic Modalities:   ?Cognitive Behavioral Therapy ?Solution-Focused Therapy ? ?Karalee Height, LCSWA ?06/28/2021  2:17 PM   ? ?

## 2021-06-28 NOTE — Progress Notes (Signed)
Recreation Therapy Notes ? ? ?Date: 06/28/2021 ? ?Time: 10:05 am ? ?Location: Craft room  ? ?Behavioral response: N/A ? ?Intervention Topic: Problem Solving   ? ?Discussion/Intervention: ?Patient refused to attend group. ? ?Clinical Observations/Feedback: ?Patient refused to attend group. ? ?Casey Silva LRT/CTRS ? ? ? ? ? ? ? ?Casey Silva ?06/28/2021 11:51 AM ?

## 2021-06-28 NOTE — Progress Notes (Signed)
[facility] MD Progress Note  06/28/2021 3:56 PM Casey Silva  MRN:  [id number] Subjective: Follow-up for this 66 year old man with depression.  Patient seen and chart reviewed.  Still stays in bed most of the time but tells me he thinks maybe he is feeling a little better.  They did get him to take a shower and cleaned himself up a little bit.  Denies active suicidal ideation but still has very low motivation and hopelessness.  Has had significant aches and pains ever since the treatment Principal Problem: Severe recurrent major depression with psychotic features (HCC) Diagnosis: Principal Problem:   Severe recurrent major depression with psychotic features (HCC) Active Problems:   Primary insomnia   GAD (generalized anxiety disorder)   Essential hypertension  Total Time spent with patient: 30 minutes  Past Psychiatric History: Past history of recurrent severe depression  Past Medical History:  Past Medical History:  Diagnosis Date   Anxiety    Asthma    Depression    History reviewed. No pertinent surgical history. Family History:  Family History  Problem Relation Age of Onset   Bipolar disorder Mother    Family Psychiatric  History: See previous Social History:  Social History   Substance and Sexual Activity  Alcohol Use No     Social History   Substance and Sexual Activity  Drug Use No    Social History   Socioeconomic History   Marital status: Single    Spouse name: Not on file   Number of children: 1   Years of education: Not on file   Highest education level: GED or equivalent  Occupational History   Not on file  Tobacco Use   Smoking status: Never   Smokeless tobacco: Never  Vaping Use   Vaping Use: Unknown  Substance and Sexual Activity   Alcohol use: No   Drug use: No   Sexual activity: Not Currently  Other Topics Concern   Not on file  Social History Narrative   Marital status:  Single   Children: Daughter   Employment:  Unemployment. previous work for  [employer].   Alcohol:  None   Drugs:  none   Exercise:  Regularly very   Religion:  Jewish   Social Determinants of Corporate investment banker Strain: Not on file  Food Insecurity: Not on file  Transportation Needs: Not on file  Physical Activity: Not on file  Stress: Not on file  Social Connections: Not on file   Additional Social History:                         Sleep: Fair  Appetite:  Poor  Current Medications: Current Facility-Administered Medications  Medication Dose Route Frequency Provider Last Rate Last Admin   0.9 %  sodium chloride infusion  500 mL Intravenous Once Rashod Gougeon T, MD       amLODipine (NORVASC) tablet 10 mg  10 mg Oral Daily Ector Laurel T, MD   10 mg at 06/28/21 0820   diclofenac Sodium (VOLTAREN) 1 % topical gel 2 g  2 g Topical QID Kalyn Dimattia T, MD   2 g at 06/27/21 1707   feeding supplement (ENSURE ENLIVE / ENSURE PLUS) liquid 237 mL  237 mL Oral BID BM Yvan Dority T, MD   237 mL at 06/28/21 1021   gabapentin (NEURONTIN) capsule 400 mg  400 mg Oral TID Alysiana Ethridge, Jackquline Denmark, MD   400 mg at 06/28/21 1223  glycopyrrolate (ROBINUL) injection 0.1 mg  0.1 mg Intravenous Once Evany Schecter T, MD       hydrOXYzine (ATARAX) tablet 50 mg  50 mg Oral Q6H PRN Marchella Hibbard, Jackquline Denmark, MD   50 mg at 06/25/21 2209   ibuprofen (ADVIL) tablet 600 mg  600 mg Oral Q6H PRN Ceci Taliaferro T, MD   600 mg at 06/27/21 0915   melatonin tablet 5 mg  5 mg Oral QHS Kyerra Vargo T, MD   5 mg at 06/27/21 2112   mirtazapine (REMERON) tablet 30 mg  30 mg Oral QHS Flecia Shutter T, MD   30 mg at 06/27/21 2112   multivitamin with minerals tablet 1 tablet  1 tablet Oral Daily Darrnell Mangiaracina, Jackquline Denmark, MD   1 tablet at 06/28/21 0819   ondansetron (ZOFRAN-ODT) disintegrating tablet 4 mg  4 mg Oral Q4H PRN Francile Woolford, Jackquline Denmark, MD       propranolol (INDERAL) tablet 10 mg  10 mg Oral TID Kathreen Dileo T, MD   10 mg at 06/28/21 1418   QUEtiapine (SEROQUEL) tablet 100 mg  100 mg Oral QHS  Yuniel Blaney T, MD   100 mg at 06/27/21 2111   QUEtiapine (SEROQUEL) tablet 25 mg  25 mg Oral BID Georgia Delsignore T, MD   25 mg at 06/28/21 0819   traZODone (DESYREL) tablet 100 mg  100 mg Oral QHS Verna Desrocher T, MD   100 mg at 06/27/21 2111   traZODone (DESYREL) tablet 100 mg  100 mg Oral QHS PRN Caitlain Tweed T, MD   100 mg at 06/25/21 2209   venlafaxine XR (EFFEXOR-XR) 24 hr capsule 112.5 mg  112.5 mg Oral Q breakfast Ceil Roderick T, MD   112.5 mg at 06/28/21 1610   Vitamin D (Ergocalciferol) (DRISDOL) capsule 50,000 Units  50,000 Units Oral Q7 days Stiles Maxcy, Jackquline Denmark, MD   50,000 Units at 06/25/21 1704    Lab Results:  Results for orders placed or performed during the hospital encounter of 06/23/21 (from the past 48 hour(s))  Glucose, capillary     Status: None   Collection Time: 06/27/21  6:52 AM  Result Value Ref Range   Glucose-Capillary 85 70 - 99 mg/dL    Comment: Glucose reference range applies only to samples taken after fasting for at least 8 hours.   Comment 1 Notify RN     Blood Alcohol level:  Lab Results  Component Value Date   ETH <10 06/07/2021   ETH <10 11/22/2020    Metabolic Disorder Labs: Lab Results  Component Value Date   HGBA1C 5.1 06/07/2021   MPG 99.67 06/07/2021   MPG 111.15 11/24/2020   No results found for: PROLACTIN Lab Results  Component Value Date   CHOL 197 06/07/2021   TRIG 149 06/07/2021   HDL 44 06/07/2021   CHOLHDL 4.5 06/07/2021   VLDL 30 06/07/2021   LDLCALC 123 (H) 06/07/2021   LDLCALC 148 (H) 11/24/2020    Physical Findings: AIMS:  , ,  ,  ,    CIWA:    COWS:     Musculoskeletal: Strength & Muscle Tone: within normal limits Gait & Station: normal Patient leans: N/A  Psychiatric Specialty Exam:  Presentation  General Appearance: Appropriate for Environment; Bizarre; Disheveled; Fairly Groomed  Eye Contact:Good  Speech:Clear and Coherent; Normal Rate  Speech Volume:Normal  Handedness:Right   Mood and Affect   Mood:Anxious; Depressed; Hopeless; Irritable  Affect:Blunt; Depressed   Thought Process  Thought Processes:Coherent; Linear  Descriptions  of Associations:Intact  Orientation:Full (Time, Place and Person)  Thought Content:Logical; WDL  History of Schizophrenia/Schizoaffective disorder:No  Duration of Psychotic Symptoms:N/A  Hallucinations:No data recorded Ideas of Reference:None  Suicidal Thoughts:No data recorded Homicidal Thoughts:No data recorded  Sensorium  Memory:Immediate Fair; Recent Fair; Remote Fair  Judgment:Fair  Insight:Fair   Executive Functions  Concentration:Fair  Attention Span:Good  Recall:Good  Fund of Knowledge:Fair  Language:Good   Psychomotor Activity  Psychomotor Activity:No data recorded  Assets  Assets:Communication Skills; Physical Health; Social Support   Sleep  Sleep:No data recorded   Physical Exam: Physical Exam Vitals and nursing note reviewed.  Constitutional:      Appearance: Normal appearance.  HENT:     Head: Normocephalic and atraumatic.     Mouth/Throat:     Pharynx: Oropharynx is clear.  Eyes:     Pupils: Pupils are equal, round, and reactive to light.  Cardiovascular:     Rate and Rhythm: Normal rate and regular rhythm.  Pulmonary:     Effort: Pulmonary effort is normal.     Breath sounds: Normal breath sounds.  Abdominal:     General: Abdomen is flat.     Palpations: Abdomen is soft.  Musculoskeletal:        General: Normal range of motion.  Skin:    General: Skin is warm and dry.  Neurological:     General: No focal deficit present.     Mental Status: He is alert. Mental status is at baseline.  Psychiatric:        Attention and Perception: Attention normal.        Mood and Affect: Mood is depressed.        Speech: Speech is delayed.        Behavior: Behavior is withdrawn.        Thought Content: Thought content normal.   Review of Systems  Constitutional: Negative.   HENT: Negative.     Eyes: Negative.   Respiratory: Negative.    Cardiovascular: Negative.   Gastrointestinal: Negative.   Musculoskeletal: Negative.   Skin: Negative.   Neurological: Negative.   Psychiatric/Behavioral:  Positive for depression. Negative for substance abuse and suicidal ideas.   Blood pressure 110/62, pulse 89, temperature 98.9 F (37.2 C), temperature source Oral, resp. rate 16, height 5\' 6"  (1.676 m), weight 76.2 kg, SpO2 96 %. Body mass index is 27.12 kg/m.   Treatment Plan Summary: Medication management and Plan next ECT treatment scheduled for tomorrow.  We will add pain medicine prior to treatment.  Continue ECT course and current medicine  Mordecai Rasmussen, MD 06/28/2021, 3:56 PM

## 2021-06-28 NOTE — Progress Notes (Signed)
Patient calm and pleasant during assessment denying HI/AVH. Pt endorses SI, verbally contracts for safety. Patient given education about ECT tomorrow. Patient compliant with medication administration per MD orders. Pt given education, support, and encouragement to be active in his treatment plan. Pt being monitored Q 15 minutes for safety per unit protocol. Pt remains safe on the unit. ?

## 2021-06-29 ENCOUNTER — Telehealth (HOSPITAL_COMMUNITY): Payer: 59 | Admitting: Psychiatry

## 2021-06-29 ENCOUNTER — Inpatient Hospital Stay: Payer: 59 | Admitting: Anesthesiology

## 2021-06-29 ENCOUNTER — Ambulatory Visit: Payer: 59

## 2021-06-29 ENCOUNTER — Encounter: Payer: Self-pay | Admitting: Psychiatry

## 2021-06-29 LAB — GLUCOSE, CAPILLARY
Glucose-Capillary: 88 mg/dL (ref 70–99)
Glucose-Capillary: 96 mg/dL (ref 70–99)

## 2021-06-29 MED ORDER — SODIUM CHLORIDE 0.9 % IV SOLN: 500.0000 mL | Freq: Once | INTRAVENOUS | Status: AC

## 2021-06-29 MED ORDER — METHOHEXITAL SODIUM 100 MG/10ML IV SOSY
PREFILLED_SYRINGE | INTRAVENOUS | Status: DC | PRN
Start: 2021-06-29 — End: 2021-06-29
  Administered 2021-06-29: 80 mg via INTRAVENOUS

## 2021-06-29 MED ORDER — MIDAZOLAM HCL 2 MG/2ML IJ SOLN: 2.0000 mg | Freq: Once | INTRAMUSCULAR | Status: DC

## 2021-06-29 MED ORDER — ONDANSETRON HCL 4 MG/2ML IJ SOLN
INTRAMUSCULAR | Status: AC
  Administered 2021-06-29: 4 mg via INTRAVENOUS
  Filled 2021-06-29: qty 2

## 2021-06-29 MED ORDER — ACETAMINOPHEN 325 MG PO TABS: 650.0000 mg | ORAL_TABLET | Freq: Once | ORAL | Status: DC | PRN

## 2021-06-29 MED ORDER — KETOROLAC TROMETHAMINE 30 MG/ML IJ SOLN
INTRAMUSCULAR | Status: AC
  Filled 2021-06-29: qty 1

## 2021-06-29 MED ORDER — SUCCINYLCHOLINE CHLORIDE 200 MG/10ML IV SOSY
PREFILLED_SYRINGE | INTRAVENOUS | Status: DC | PRN
Start: 2021-06-29 — End: 2021-06-29
  Administered 2021-06-29: 100 mg via INTRAVENOUS

## 2021-06-29 MED ORDER — LABETALOL HCL 5 MG/ML IV SOLN
INTRAVENOUS | Status: DC | PRN
Start: 2021-06-29 — End: 2021-06-29
  Administered 2021-06-29: 5 mg via INTRAVENOUS

## 2021-06-29 MED ORDER — ACETAMINOPHEN 160 MG/5ML PO SOLN
325.0000 mg | ORAL | Status: DC | PRN
  Filled 2021-06-29: qty 20.3

## 2021-06-29 MED ORDER — KETOROLAC TROMETHAMINE 30 MG/ML IJ SOLN
30.0000 mg | Freq: Once | INTRAMUSCULAR | Status: AC
  Administered 2021-06-29: 30 mg via INTRAVENOUS

## 2021-06-29 MED ORDER — ONDANSETRON HCL 4 MG/2ML IJ SOLN: 4.0000 mg | Freq: Once | INTRAMUSCULAR | Status: AC | PRN

## 2021-06-29 NOTE — Plan of Care (Signed)
Patient had ECT today. Patient had an incontinent episode at ECT. Patient cleaned up with minimal assistance. Patient stated that he is having a memory loss after the ECT. But patient stated that it is helping " some " with his depression. Denies SI,HI and AVH. Compliant with medications. Appetite good. Support and encouragement given. ?

## 2021-06-29 NOTE — Anesthesia Preprocedure Evaluation (Signed)
Anesthesia Evaluation  ?Patient identified by MRN, date of birth, ID band ?Patient awake ? ? ? ?Reviewed: ?Allergy & Precautions, NPO status , Patient's Chart, lab work & pertinent test results ? ?History of Anesthesia Complications ?Negative for: history of anesthetic complications ? ?Airway ?Mallampati: III ? ? ?Neck ROM: Full ? ? ? Dental ? ? ?Missing all upper teeth except 3:   ?Pulmonary ?asthma , COPD,  ?  ?Pulmonary exam normal ?breath sounds clear to auscultation ? ? ? ? ? ? Cardiovascular ?hypertension, Normal cardiovascular exam ?Rhythm:Regular Rate:Normal ? ?ECG 06/23/21: normal ?  ?Neuro/Psych ?PSYCHIATRIC DISORDERS Anxiety Depression negative neurological ROS ?   ? GI/Hepatic ?negative GI ROS,   ?Endo/Other  ?negative endocrine ROS ? Renal/GU ?negative Renal ROS  ? ?  ?Musculoskeletal ? ? Abdominal ?  ?Peds ? Hematology ?negative hematology ROS ?(+)   ?Anesthesia Other Findings ? ? Reproductive/Obstetrics ? ?  ? ? ? ? ? ? ? ? ? ? ? ? ? ?  ?  ? ? ? ? ? ? ? ? ?Anesthesia Physical ?Anesthesia Plan ? ?ASA: 3 ? ?Anesthesia Plan: General  ? ?Post-op Pain Management:   ? ?Induction: Intravenous ? ?PONV Risk Score and Plan: 2 and TIVA and Treatment may vary due to age or medical condition ? ?Airway Management Planned: Natural Airway ? ?Additional Equipment:  ? ?Intra-op Plan:  ? ?Post-operative Plan:  ? ?Informed Consent: I have reviewed the patients History and Physical, chart, labs and discussed the procedure including the risks, benefits and alternatives for the proposed anesthesia with the patient or authorized representative who has indicated his/her understanding and acceptance.  ? ? ? ? ? ?Plan Discussed with: CRNA ? ?Anesthesia Plan Comments: (LMA/GETA backup discussed.  Serial consent on chart.  Patient consented for risks of anesthesia including but not limited to:  ?- adverse reactions to medications ?- damage to eyes, teeth, lips or other oral mucosa ?- nerve damage  due to positioning  ?- sore throat or hoarseness ?- damage to heart, brain, nerves, lungs, other parts of body or loss of life ? ?Informed patient about role of CRNA in peri- and intra-operative care.  Patient voiced understanding.)  ? ? ? ? ? ? ?Anesthesia Quick Evaluation ? ?

## 2021-06-29 NOTE — H&P (Signed)
Casey Silva is an 66 y.o. male.   ?Chief Complaint: Patient is complaining already of noticing some memory impairment ?HPI: Severe recurrent depression ? ?Past Medical History:  ?Diagnosis Date  ? Anxiety   ? Asthma   ? Depression   ? ? ?History reviewed. No pertinent surgical history. ? ?Family History  ?Problem Relation Age of Onset  ? Bipolar disorder Mother   ? ?Social History:  reports that he has never smoked. He has never used smokeless tobacco. He reports that he does not drink alcohol and does not use drugs. ? ?Allergies:  ?Allergies  ?Allergen Reactions  ? Dust Mite Mixed Allergen Ext [Mite (D. Farinae)] Shortness Of Breath  ? Bee Pollen Other (See Comments)  ?  Watery Eyes  ? ? ?Medications Prior to Admission  ?Medication Sig Dispense Refill  ? amLODipine (NORVASC) 10 MG tablet Take 1 tablet (10 mg total) by mouth daily. 30 tablet 0  ? gabapentin (NEURONTIN) 400 MG capsule Take 1 capsule (400 mg total) by mouth 3 (three) times daily. 90 capsule 0  ? LORazepam (ATIVAN) 0.5 MG tablet Take 1 tablet (0.5 mg total) by mouth 2 (two) times daily as needed for anxiety. 30 tablet 0  ? melatonin 5 MG TABS Take 1 tablet (5 mg total) by mouth at bedtime. 30 tablet 0  ? mirtazapine (REMERON) 30 MG tablet Take 1 tablet (30 mg total) by mouth at bedtime. 30 tablet 3  ? propranolol (INDERAL) 10 MG tablet Take 1 tablet (10 mg total) by mouth 3 (three) times daily. 90 tablet 0  ? QUEtiapine (SEROQUEL) 100 MG tablet Take 1 tablet (100 mg total) by mouth at bedtime. 30 tablet 0  ? QUEtiapine (SEROQUEL) 25 MG tablet Take 1 tablet (25 mg total) by mouth 2 (two) times daily. 60 tablet 0  ? traZODone (DESYREL) 100 MG tablet Take 1 tablet (100 mg total) by mouth at bedtime. 30 tablet 0  ? venlafaxine XR (EFFEXOR-XR) 37.5 MG 24 hr capsule Take 3 capsules (112.5 mg total) by mouth daily with breakfast. 30 capsule 0  ? Vitamin D, Ergocalciferol, (DRISDOL) 1.25 MG (50000 UNIT) CAPS capsule Take 1 capsule (50,000 Units total) by mouth  every 7 (seven) days. 5 capsule 0  ? ? ?Results for orders placed or performed during the hospital encounter of 06/23/21 (from the past 48 hour(s))  ?Glucose, capillary     Status: None  ? Collection Time: 06/29/21  6:54 AM  ?Result Value Ref Range  ? Glucose-Capillary 88 70 - 99 mg/dL  ?  Comment: Glucose reference range applies only to samples taken after fasting for at least 8 hours.  ?Glucose, capillary     Status: None  ? Collection Time: 06/29/21 11:10 AM  ?Result Value Ref Range  ? Glucose-Capillary 96 70 - 99 mg/dL  ?  Comment: Glucose reference range applies only to samples taken after fasting for at least 8 hours.  ? ?No results found. ? ?Review of Systems  ?Constitutional: Negative.   ?HENT: Negative.    ?Eyes: Negative.   ?Respiratory: Negative.    ?Cardiovascular: Negative.   ?Gastrointestinal: Negative.   ?Musculoskeletal: Negative.   ?Skin: Negative.   ?Neurological: Negative.   ?Psychiatric/Behavioral:  Positive for dysphoric mood.   ? ?Blood pressure (!) 95/59, pulse 80, temperature (!) 97.1 ?F (36.2 ?C), resp. rate (!) 21, height '5\' 6"'$  (1.676 m), weight 76.2 kg, SpO2 (!) 88 %. ?Physical Exam ?Vitals and nursing note reviewed.  ?Constitutional:   ?   Appearance:  He is well-developed.  ?HENT:  ?   Head: Normocephalic and atraumatic.  ?Eyes:  ?   Conjunctiva/sclera: Conjunctivae normal.  ?   Pupils: Pupils are equal, round, and reactive to light.  ?Cardiovascular:  ?   Heart sounds: Normal heart sounds.  ?Pulmonary:  ?   Effort: Pulmonary effort is normal.  ?Abdominal:  ?   Palpations: Abdomen is soft.  ?Musculoskeletal:     ?   General: Normal range of motion.  ?   Cervical back: Normal range of motion.  ?Skin: ?   General: Skin is warm and dry.  ?Neurological:  ?   General: No focal deficit present.  ?   Mental Status: He is alert.  ?Psychiatric:     ?   Attention and Perception: He is inattentive.     ?   Mood and Affect: Mood is depressed.     ?   Speech: Speech is delayed.     ?   Behavior:  Behavior is slowed.     ?   Cognition and Memory: He exhibits impaired recent memory.  ?  ? ?Assessment/Plan ?Continue course of ECT.  Second treatment today.  Assess for possibly excessive memory loss. ? ?Alethia Berthold, MD ?06/29/2021, 5:16 PM ? ? ? ?

## 2021-06-29 NOTE — Progress Notes (Signed)
Patient calm and pleasant during assessment denying HI/AVH. Pt endorses SI, verbally contracts for safety. Patient given education about ECT tomorrow. Patient compliant with medication administration per MD orders. Pt given education, support, and encouragement to be active in his treatment plan. Pt being monitored Q 15 minutes for safety per unit protocol. Pt remains safe on the unit. ?

## 2021-06-29 NOTE — Transfer of Care (Signed)
Immediate Anesthesia Transfer of Care Note ? ?Patient: Casey Silva ? ?Procedure(s) Performed: ECT TX ? ?Patient Location: PACU ? ?Anesthesia Type:General ? ?Level of Consciousness: awake, alert  and oriented ? ?Airway & Oxygen Therapy: Patient Spontanous Breathing and Patient connected to face mask oxygen ? ?Post-op Assessment: Report given to RN and Post -op Vital signs reviewed and stable ? ?Post vital signs: Reviewed and stable ? ?Last Vitals:  ?Vitals Value Taken Time  ?BP 121/68 06/29/21 1407  ?Temp 36.2 ?C 06/29/21 1407  ?Pulse 80 06/29/21 1410  ?Resp 20 06/29/21 1410  ?SpO2 95 % 06/29/21 1410  ?Vitals shown include unvalidated device data. ? ?Last Pain:  ?Vitals:  ? 06/29/21 1407  ?TempSrc:   ?PainSc: 0-No pain  ?   ? ?Patients Stated Pain Goal:  (no) (06/27/21 0915) ? ?Complications: No notable events documented. ?

## 2021-06-29 NOTE — Group Note (Signed)
[place] LCSW Group Therapy Note ? ? ?Group Date: 06/29/2021 ?Start Time: 1315 ?End Time: 1610 ? ? ?Type of Therapy/Topic:  Group Therapy:  Emotion Regulation ? ?Participation Level:  Did Not Attend  ? ?Mood: ? ?Description of Group:   ? The purpose of this group is to assist patients in learning to regulate negative emotions and experience positive emotions. Patients will be guided to discuss ways in which they have been vulnerable to their negative emotions. These vulnerabilities will be juxtaposed with experiences of positive emotions or situations, and patients challenged to use positive emotions to combat negative ones. Special emphasis will be placed on coping with negative emotions in conflict situations, and patients will process healthy conflict resolution skills. ? ?Therapeutic Goals: ?Patient will identify two positive emotions or experiences to reflect on in order to balance out negative emotions:  ?Patient will label two or more emotions that they find the most difficult to experience:  ?Patient will be able to demonstrate positive conflict resolution skills through discussion or role plays:  ? ?Summary of Patient Progress: ? ? ?X ? ? ? ?Therapeutic Modalities:   ?Cognitive Behavioral Therapy ?Feelings Identification ?Dialectical Behavioral Therapy ? ? ?Rozann Lesches, LCSW ?

## 2021-06-29 NOTE — Progress Notes (Signed)
Recreation Therapy Notes ? ? ?Date: 06/29/2021 ? ?Time: 11:05 am  ? ?Location: Craft room  ? ?Behavioral response: N/A ? ?Intervention Topic: Stress Management  ? ?Discussion/Intervention: ?Patient refused to attend group. ? ?Clinical Observations/Feedback: ?Patient refused to attend group. ? ?Pressley Tadesse LRT/CTRS ? ? ? ? ? ? ? ?Tersea Aulds ?06/29/2021 12:32 PM ?

## 2021-06-29 NOTE — Anesthesia Postprocedure Evaluation (Signed)
Anesthesia Post Note ? ?Patient: Casey Silva ? ?Procedure(s) Performed: ECT TX ? ?Patient location during evaluation: PACU ?Anesthesia Type: General ?Level of consciousness: awake and alert, oriented and patient cooperative ?Pain management: pain level controlled ?Vital Signs Assessment: post-procedure vital signs reviewed and stable ?Respiratory status: spontaneous breathing, nonlabored ventilation and respiratory function stable ?Cardiovascular status: blood pressure returned to baseline and stable ?Postop Assessment: adequate PO intake ?Anesthetic complications: no ? ? ?No notable events documented. ? ? ?Last Vitals:  ?Vitals:  ? 06/29/21 1417 06/29/21 1420  ?BP: 100/65 (!) 95/59  ?Pulse: 80 80  ?Resp: 20 (!) 21  ?Temp:    ?SpO2: 93% (!) 88%  ?  ?Last Pain:  ?Vitals:  ? 06/29/21 1417  ?TempSrc:   ?PainSc: 0-No pain  ? ? ?  ?  ?  ?  ?  ?  ? ?Darrin Nipper ? ? ? ? ?

## 2021-06-29 NOTE — BH IP Treatment Plan (Signed)
Interdisciplinary Treatment and Diagnostic Plan Update ? ?06/29/2021 ?Time of Session: 8:30AM ?Casey Silva ?MRN: [id number] ? ?Principal Diagnosis: Severe recurrent major depression with psychotic features ([place]) ? ?Secondary Diagnoses: Principal Problem: ?  Severe recurrent major depression with psychotic features ([place]) ?Active Problems: ?  Primary insomnia ?  GAD (generalized anxiety disorder) ?  Essential hypertension ? ? ?Current Medications:  ?Current Facility-Administered Medications  ?Medication Dose Route Frequency Provider Last Rate Last Admin  ? 0.9 %  sodium chloride infusion  500 mL Intravenous Once Clapacs, [name], MD      ? amLODipine (NORVASC) tablet 10 mg  10 mg Oral Daily Clapacs, [name], MD   10 mg at 06/29/21 0804  ? diclofenac Sodium (VOLTAREN) 1 % topical gel 2 g  2 g Topical QID Clapacs, [name], MD   2 g at 06/27/21 1707  ? feeding supplement (ENSURE ENLIVE / ENSURE PLUS) liquid 237 mL  237 mL Oral BID BM Clapacs, [name], MD   237 mL at 06/28/21 1614  ? gabapentin (NEURONTIN) capsule 400 mg  400 mg Oral TID Clapacs, [name], MD   400 mg at 06/29/21 0804  ? glycopyrrolate (ROBINUL) injection 0.1 mg  0.1 mg Intravenous Once Clapacs, [name], MD      ? hydrOXYzine (ATARAX) tablet 50 mg  50 mg Oral Q6H PRN Clapacs, [name], MD   50 mg at 06/25/21 2209  ? ibuprofen (ADVIL) tablet 600 mg  600 mg Oral Q6H PRN Clapacs, [name], MD   600 mg at 06/27/21 0915  ? melatonin tablet 5 mg  5 mg Oral QHS Clapacs, [name], MD   5 mg at 06/28/21 2114  ? mirtazapine (REMERON) tablet 30 mg  30 mg Oral QHS Clapacs, [name], MD   30 mg at 06/28/21 2114  ? multivitamin with minerals tablet 1 tablet  1 tablet Oral Daily Clapacs, [name], MD   1 tablet at 06/28/21 [id number]  ? ondansetron (ZOFRAN-ODT) disintegrating tablet 4 mg  4 mg Oral Q4H PRN Clapacs, [name], MD      ? propranolol (INDERAL) tablet 10 mg  10 mg Oral TID Clapacs, [name], MD   10 mg at 06/29/21 0804  ? QUEtiapine (SEROQUEL) tablet 100 mg  100 mg Oral QHS Clapacs,  [name], MD   100 mg at 06/28/21 2115  ? QUEtiapine (SEROQUEL) tablet 25 mg  25 mg Oral BID Clapacs, [name], MD   25 mg at 06/29/21 0804  ? traZODone (DESYREL) tablet 100 mg  100 mg Oral QHS Clapacs, [name], MD   100 mg at 06/28/21 2114  ? traZODone (DESYREL) tablet 100 mg  100 mg Oral QHS PRN Clapacs, [name], MD   100 mg at 06/25/21 2209  ? venlafaxine XR (EFFEXOR-XR) 24 hr capsule 112.5 mg  112.5 mg Oral Q breakfast Clapacs, [name], MD   112.5 mg at 06/29/21 0805  ? Vitamin D (Ergocalciferol) (DRISDOL) capsule 50,000 Units  50,000 Units Oral Q7 days Clapacs, [name], MD   50,000 Units at 06/25/21 1704  ? ?PTA Medications: ?Medications Prior to Admission  ?Medication Sig Dispense Refill Last Dose  ? amLODipine (NORVASC) 10 MG tablet Take 1 tablet (10 mg total) by mouth daily. 30 tablet 0   ? gabapentin (NEURONTIN) 400 MG capsule Take 1 capsule (400 mg total) by mouth 3 (three) times daily. 90 capsule 0   ? LORazepam (ATIVAN) 0.5 MG tablet Take 1 tablet (0.5 mg total) by  mouth 2 (two) times daily as needed for anxiety. 30 tablet 0   ? melatonin 5 MG TABS Take 1 tablet (5 mg total) by mouth at bedtime. 30 tablet 0   ? mirtazapine (REMERON) 30 MG tablet Take 1 tablet (30 mg total) by mouth at bedtime. 30 tablet 3   ? propranolol (INDERAL) 10 MG tablet Take 1 tablet (10 mg total) by mouth 3 (three) times daily. 90 tablet 0   ? QUEtiapine (SEROQUEL) 100 MG tablet Take 1 tablet (100 mg total) by mouth at bedtime. 30 tablet 0   ? QUEtiapine (SEROQUEL) 25 MG tablet Take 1 tablet (25 mg total) by mouth 2 (two) times daily. 60 tablet 0   ? traZODone (DESYREL) 100 MG tablet Take 1 tablet (100 mg total) by mouth at bedtime. 30 tablet 0   ? venlafaxine XR (EFFEXOR-XR) 37.5 MG 24 hr capsule Take 3 capsules (112.5 mg total) by mouth daily with breakfast. 30 capsule 0   ? Vitamin D, Ergocalciferol, (DRISDOL) 1.25 MG (50000 UNIT) CAPS capsule Take 1 capsule (50,000 Units total) by mouth every 7 (seven) days. 5 capsule 0    ? ? ?Patient Stressors: Loss of connection with work, loss of sense of purpose   ?Other: loss of mental health   ? ?Patient Strengths: Ability for insight  ?Communication skills  ?General fund of knowledge  ?Motivation for treatment/growth  ? ?Treatment Modalities: Medication Management, Group therapy, Case management,  ?1 to 1 session with clinician, Psychoeducation, Recreational therapy. ? ? ?Physician Treatment Plan for Primary Diagnosis: Severe recurrent major depression with psychotic features ([place]) ?Long Term Goal(s): Improvement in symptoms so as ready for discharge  ? ?Short Term Goals: Ability to maintain clinical measurements within normal limits will improve ?Compliance with prescribed medications will improve ?Ability to verbalize feelings will improve ?Ability to disclose and discuss suicidal ideas ?Ability to demonstrate self-control will improve ? ?Medication Management: Evaluate patient's response, side effects, and tolerance of medication regimen. ? ?Therapeutic Interventions: 1 to 1 sessions, Unit Group sessions and Medication administration. ? ?Evaluation of Outcomes: Progressing ? ?Physician Treatment Plan for Secondary Diagnosis: Principal Problem: ?  Severe recurrent major depression with psychotic features ([place]) ?Active Problems: ?  Primary insomnia ?  GAD (generalized anxiety disorder) ?  Essential hypertension ? ?Long Term Goal(s): Improvement in symptoms so as ready for discharge  ? ?Short Term Goals: Ability to maintain clinical measurements within normal limits will improve ?Compliance with prescribed medications will improve ?Ability to verbalize feelings will improve ?Ability to disclose and discuss suicidal ideas ?Ability to demonstrate self-control will improve    ? ?Medication Management: Evaluate patient's response, side effects, and tolerance of medication regimen. ? ?Therapeutic Interventions: 1 to 1 sessions, Unit Group sessions and Medication administration. ? ?Evaluation of  Outcomes: Progressing ? ? ?RN Treatment Plan for Primary Diagnosis: Severe recurrent major depression with psychotic features ([place]) ?Long Term Goal(s): Knowledge of disease and therapeutic regimen to maintain health will improve ? ?Short Term Goals: Ability to demonstrate self-control, Ability to participate in decision making will improve, Ability to verbalize feelings will improve, Ability to disclose and discuss suicidal ideas, Ability to identify and develop effective coping behaviors will improve, and Compliance with prescribed medications will improve ? ?Medication Management: RN will administer medications as ordered by provider, will assess and evaluate patient's response and provide education to patient for prescribed medication. RN will report any adverse and/or side effects to prescribing provider. ? ?Therapeutic Interventions: 1 on 1 counseling sessions, Psychoeducation, Medication  administration, Evaluate responses to treatment, Monitor vital signs and CBGs as ordered, Perform/monitor CIWA, COWS, AIMS and Fall Risk screenings as ordered, Perform wound care treatments as ordered. ? ?Evaluation of Outcomes: Progressing ? ? ?LCSW Treatment Plan for Primary Diagnosis: Severe recurrent major depression with psychotic features ([place]) ?Long Term Goal(s): Safe transition to appropriate next level of care at discharge, Engage patient in therapeutic group addressing interpersonal concerns. ? ?Short Term Goals: Engage patient in aftercare planning with referrals and resources, Increase social support, Increase ability to appropriately verbalize feelings, Increase emotional regulation, Facilitate acceptance of mental health diagnosis and concerns, and Increase skills for wellness and recovery ? ?Therapeutic Interventions: Assess for all discharge needs, 1 to 1 time with Education officer, museum, Explore available resources and support systems, Assess for adequacy in community support network, Educate family and significant  other(s) on suicide prevention, Complete Psychosocial Assessment, Interpersonal group therapy. ? ?Evaluation of Outcomes: Progressing ? ? ?Progress in Treatment: ?Attending groups: No. ?Participating in groups: No. ?Johny Blamer

## 2021-06-29 NOTE — Procedures (Signed)
ECT SERVICES ?Physician?s Interval Evaluation & Treatment Note ? ?Patient Identification: Casey Silva ?MRN:  [id number] ?Date of Evaluation:  06/29/2021 ?[place] #: 2 ? ?MADRS:  ? ?MMSE:  ? ?P.E. Findings: ? ?Disheveled but no worse than previously ? ?Psychiatric Interval Note: ? ?Still depressed and withdrawn ? ?Subjective: ? ?Patient is a 66 y.o. male seen for evaluation for Electroconvulsive Therapy. ?Complaining of short-term memory loss ? ?Treatment Summary: ? ? '[]'$   Right Unilateral             '[x]'$  Bilateral ?  ?% Energy : 1.0 ms 100% ? ? Impedance: 1190 ohms ? ?Seizure Energy Index: 945 ?V squared ? ?Postictal Suppression Index: No reading obtained ? ?Seizure Concordance Index: 88% ? ?Medications ? ?Pre Shock: Brevital 80 mg succinylcholine 100 mg ? ?Post Shock:   ? ?Seizure Duration: 10 seconds EMG 33 seconds EEG ? ? ?Comments: ?Switching to ketamine for next treatment ? ?Lungs: ? ?'[x]'$   Clear to auscultation              ? ?'[]'$  Other:  ? ?Heart: ?  ? '[x]'$   Regular rhythm             '[]'$  irregular rhythm ? ? ? '[x]'$   Previous H&P reviewed, patient examined and there are NO CHANGES              ?  ? '[]'$   Previous H&P reviewed, patient examined and there are changes noted. ? ? ?Alethia Berthold, MD ?3/22/20235:17 PM ? ?  ?

## 2021-06-29 NOTE — Progress Notes (Signed)
[facility] MD Progress Note  06/29/2021 5:07 PM Casey Silva  MRN:  [id number] Subjective: Follow-up patient with severe depression.  He had his second electroconvulsive therapy treatment today.  The treatment itself went well except that he did have a bowel movement from the seizure.  Fortunately he was wearing an undergarment.  Patient says he woke up from the treatment very confused could not remember where he was or what was happening to him.  Does not have a clear sense right now as to whether his depression has changed. Principal Problem: Severe recurrent major depression with psychotic features ([facility]) Diagnosis: Principal Problem:   Severe recurrent major depression with psychotic features ([facility]) Active Problems:   Primary insomnia   GAD (generalized anxiety disorder)   Essential hypertension  Total Time spent with patient: 30 minutes  Past Psychiatric History: Past history of what sounds like a recurrent severe depression  Past Medical History:  Past Medical History:  Diagnosis Date   Anxiety    Asthma    Depression    History reviewed. No pertinent surgical history. Family History:  Family History  Problem Relation Age of Onset   Bipolar disorder Mother    Family Psychiatric  History: See previous.  Bipolar in mother. Social History:  Social History   Substance and Sexual Activity  Alcohol Use No     Social History   Substance and Sexual Activity  Drug Use No    Social History   Socioeconomic History   Marital status: Single    Spouse name: Not on file   Number of children: 1   Years of education: Not on file   Highest education level: GED or equivalent  Occupational History   Not on file  Tobacco Use   Smoking status: Never   Smokeless tobacco: Never  Vaping Use   Vaping Use: Unknown  Substance and Sexual Activity   Alcohol use: No   Drug use: No   Sexual activity: Not Currently  Other Topics Concern   Not on file  Social History Narrative   Marital  status:  Single   Children: Daughter   Employment:  Unemployment. previous work for [employer].   Alcohol:  None   Drugs:  none   Exercise:  Regularly very   Religion:  Jewish   Social Determinants of Corporate investment banker Strain: Not on file  Food Insecurity: Not on file  Transportation Needs: Not on file  Physical Activity: Not on file  Stress: Not on file  Social Connections: Not on file   Additional Social History:                         Sleep: Fair  Appetite:  Poor  Current Medications: Current Facility-Administered Medications  Medication Dose Route Frequency Provider Last Rate Last Admin   0.9 %  sodium chloride infusion  500 mL Intravenous Once Nashira Mcglynn, Jackquline Denmark, MD       acetaminophen (TYLENOL) tablet 650 mg  650 mg Oral Once PRN Reed Breech, MD       Or   acetaminophen (TYLENOL) 160 MG/5ML solution 325-650 mg  325-650 mg Oral Q4H PRN Reed Breech, MD       amLODipine (NORVASC) tablet 10 mg  10 mg Oral Daily Manika Hast T, MD   10 mg at 06/29/21 4098   diclofenac Sodium (VOLTAREN) 1 % topical gel 2 g  2 g Topical QID Yeni Jiggetts, Jackquline Denmark, MD   2  g at 06/27/21 1707   feeding supplement (ENSURE ENLIVE / ENSURE PLUS) liquid 237 mL  237 mL Oral BID BM Grabiela Wohlford,  T, MD   237 mL at 06/29/21 1648   gabapentin (NEURONTIN) capsule 400 mg  400 mg Oral TID Kaitlan Bin,  T, MD   400 mg at 06/29/21 1646   glycopyrrolate (ROBINUL) injection 0.1 mg  0.1 mg Intravenous Once Berle Fitz, Jackquline Denmark, MD       hydrOXYzine (ATARAX) tablet 50 mg  50 mg Oral Q6H PRN Rosaura Bolon, Jackquline Denmark, MD   50 mg at 06/25/21 2209   ibuprofen (ADVIL) tablet 600 mg  600 mg Oral Q6H PRN luke Haugen T, MD   600 mg at 06/27/21 0915   ketorolac (TORADOL) 30 MG/ML injection            melatonin tablet 5 mg  5 mg Oral QHS Leonce Bale T, MD   5 mg at 06/28/21 2114   midazolam (VERSED) injection 2 mg  2 mg Intravenous Once Madalaine Portier T, MD       mirtazapine (REMERON) tablet 30 mg  30 mg Oral QHS  Brittany Osier T, MD   30 mg at 06/28/21 2114   multivitamin with minerals tablet 1 tablet  1 tablet Oral Daily Ravin Bendall, Jackquline Denmark, MD   1 tablet at 06/29/21 1651   ondansetron (ZOFRAN-ODT) disintegrating tablet 4 mg  4 mg Oral Q4H PRN Kit Brubacher, Jackquline Denmark, MD       propranolol (INDERAL) tablet 10 mg  10 mg Oral TID , Jackquline Denmark, MD   10 mg at 06/29/21 0804   QUEtiapine (SEROQUEL) tablet 100 mg  100 mg Oral QHS Kylii Ennis T, MD   100 mg at 06/28/21 2115   QUEtiapine (SEROQUEL) tablet 25 mg  25 mg Oral BID Diannah Rindfleisch T, MD   25 mg at 06/29/21 1646   traZODone (DESYREL) tablet 100 mg  100 mg Oral QHS Bryann Mcnealy T, MD   100 mg at 06/28/21 2114   traZODone (DESYREL) tablet 100 mg  100 mg Oral QHS PRN Adekunle Rohrbach T, MD   100 mg at 06/25/21 2209   venlafaxine XR (EFFEXOR-XR) 24 hr capsule 112.5 mg  112.5 mg Oral Q breakfast Sender Rueb T, MD   112.5 mg at 06/29/21 0805   Vitamin D (Ergocalciferol) (DRISDOL) capsule 50,000 Units  50,000 Units Oral Q7 days , Jackquline Denmark, MD   50,000 Units at 06/25/21 1704    Lab Results:  Results for orders placed or performed during the hospital encounter of 06/23/21 (from the past 48 hour(s))  Glucose, capillary     Status: None   Collection Time: 06/29/21  6:54 AM  Result Value Ref Range   Glucose-Capillary 88 70 - 99 mg/dL    Comment: Glucose reference range applies only to samples taken after fasting for at least 8 hours.  Glucose, capillary     Status: None   Collection Time: 06/29/21 11:10 AM  Result Value Ref Range   Glucose-Capillary 96 70 - 99 mg/dL    Comment: Glucose reference range applies only to samples taken after fasting for at least 8 hours.    Blood Alcohol level:  Lab Results  Component Value Date   [facility] <10 06/07/2021   ETH <10 11/22/2020    Metabolic Disorder Labs: Lab Results  Component Value Date   HGBA1C 5.1 06/07/2021   MPG 99.67 06/07/2021   MPG 111.15 11/24/2020   No results found for: PROLACTIN Lab Results  Component Value Date   CHOL 197 06/07/2021   TRIG 149 06/07/2021   HDL 44 06/07/2021   CHOLHDL 4.5 06/07/2021   VLDL 30 06/07/2021   LDLCALC 123 (H) 06/07/2021   LDLCALC 148 (H) 11/24/2020    Physical Findings: AIMS:  , ,  ,  ,    CIWA:    COWS:     Musculoskeletal: Strength & Muscle Tone: within normal limits Gait & Station: normal Patient leans: N/A  Psychiatric Specialty Exam:  Presentation  General Appearance: Appropriate for Environment; Bizarre; Disheveled; Fairly Groomed  Eye Contact:Good  Speech:Clear and Coherent; Normal Rate  Speech Volume:Normal  Handedness:Right   Mood and Affect  Mood:Anxious; Depressed; Hopeless; Irritable  Affect:Blunt; Depressed   Thought Process  Thought Processes:Coherent; Linear  Descriptions of Associations:Intact  Orientation:Full (Time, Place and Person)  Thought Content:Logical; WDL  History of Schizophrenia/Schizoaffective disorder:No  Duration of Psychotic Symptoms:N/A  Hallucinations:No data recorded Ideas of Reference:None  Suicidal Thoughts:No data recorded Homicidal Thoughts:No data recorded  Sensorium  Memory:Immediate Fair; Recent Fair; Remote Fair  Judgment:Fair  Insight:Fair   Executive Functions  Concentration:Fair  Attention Span:Good  Recall:Good  Fund of Knowledge:Fair  Language:Good   Psychomotor Activity  Psychomotor Activity:No data recorded  Assets  Assets:Communication Skills; Physical Health; Social Support   Sleep  Sleep:No data recorded   Physical Exam: Physical Exam Vitals and nursing note reviewed.  Constitutional:      Appearance: Normal appearance.  HENT:     Head: Normocephalic and atraumatic.     Mouth/Throat:     Pharynx: Oropharynx is clear.  Eyes:     Pupils: Pupils are equal, round, and reactive to light.  Cardiovascular:     Rate and Rhythm: Normal rate and regular rhythm.  Pulmonary:     Effort: Pulmonary effort is normal.     Breath  sounds: Normal breath sounds.  Abdominal:     General: Abdomen is flat.     Palpations: Abdomen is soft.  Musculoskeletal:        General: Normal range of motion.  Skin:    General: Skin is warm and dry.  Neurological:     General: No focal deficit present.     Mental Status: He is alert. Mental status is at baseline.  Psychiatric:        Attention and Perception: He is inattentive.        Mood and Affect: Mood is depressed.        Speech: Speech is delayed.        Behavior: Behavior is slowed.        Thought Content: Thought content normal.        Cognition and Memory: Cognition is impaired. Memory is impaired.   Review of Systems  Constitutional: Negative.   HENT: Negative.    Eyes: Negative.   Respiratory: Negative.    Cardiovascular: Negative.   Gastrointestinal: Negative.   Musculoskeletal: Negative.   Skin: Negative.   Neurological: Negative.   Psychiatric/Behavioral:  Positive for depression and memory loss. Negative for hallucinations, substance abuse and suicidal ideas.   Blood pressure (!) 95/59, pulse 80, temperature (!) 97.1 F (36.2 C), resp. rate (!) 21, height 5\' 6"  (1.676 m), weight 76.2 kg, SpO2 (!) 88 %. Body mass index is 27.12 kg/m.   Treatment Plan Summary: Medication management and Plan continue current medication.  Next ECT treatment Friday.  I am optimistic that we may see some continued improvement although I think he is also likely to have some  significant memory problems.  If he continues to complain of memory problems tomorrow and Friday we may have to back down to right unilateral treatment although that would be difficult given that it is already proving a challenge to have adequate seizures.  We are going to switch to ketamine with next treatment.  Mordecai Rasmussen, MD 06/29/2021, 5:07 PM

## 2021-06-29 NOTE — Anesthesia Procedure Notes (Signed)
Date/Time: 06/29/2021 1:48 PM ?Performed by: Nelda Marseille, CRNA ?Pre-anesthesia Checklist: Patient identified, Emergency Drugs available, Suction available, Patient being monitored and Timeout performed ?Oxygen Delivery Method: Circle system utilized ?Ventilation: Mask ventilation without difficulty and Mask ventilation throughout procedure ? ? ? ? ?

## 2021-06-30 ENCOUNTER — Other Ambulatory Visit: Payer: Self-pay | Admitting: Psychiatry

## 2021-06-30 ENCOUNTER — Other Ambulatory Visit: Payer: Self-pay

## 2021-06-30 LAB — GASTROINTESTINAL PANEL BY PCR, STOOL (REPLACES STOOL CULTURE)

## 2021-06-30 LAB — RESPIRATORY PANEL BY PCR

## 2021-06-30 LAB — RESP PANEL BY RT-PCR (FLU A&B, COVID) ARPGX2
Influenza A by PCR: NEGATIVE
Influenza B by PCR: NEGATIVE
SARS Coronavirus 2 by RT PCR: NEGATIVE

## 2021-06-30 LAB — HIV ANTIBODY (ROUTINE TESTING W REFLEX): HIV Screen 4th Generation wRfx: NONREACTIVE

## 2021-06-30 NOTE — Plan of Care (Signed)
?  Problem: Education: ?Goal: Knowledge of the prescribed therapeutic regimen will improve ?Outcome: Progressing ?  ?Problem: Coping: ?Goal: Will verbalize feelings ?Outcome: Progressing ?  ?Problem: Health Behavior/Discharge Planning: ?Goal: Compliance with therapeutic regimen will improve ?Outcome: Progressing ?  ?Problem: Self-Concept: ?Goal: Level of anxiety will decrease ?Outcome: Not Progressing ?  ?

## 2021-06-30 NOTE — Progress Notes (Signed)
Stool sample collected. Patient c/o nausea, constipation and diarrhea.  ?

## 2021-06-30 NOTE — Group Note (Signed)
[place] LCSW Group Therapy Note ? ? ?Group Date: 06/30/2021 ?Start Time: 1300 ?End Time: 1400 ? ? ?Type of Therapy/Topic:  Group Therapy:  Balance in Life ? ?Participation Level:  Did Not Attend  ? ?Description of Group:   ? This group will address the concept of balance and how it feels and looks when one is unbalanced. Patients will be encouraged to process areas in their lives that are out of balance, and identify reasons for remaining unbalanced. Facilitators will guide patients utilizing problem- solving interventions to address and correct the stressor making their life unbalanced. Understanding and applying boundaries will be explored and addressed for obtaining  and maintaining a balanced life. Patients will be encouraged to explore ways to assertively make their unbalanced needs known to significant others in their lives, using other group members and facilitator for support and feedback. ? ?Therapeutic Goals: ?Patient will identify two or more emotions or situations they have that consume much of in their lives. ?Patient will identify signs/triggers that life has become out of balance:  ?Patient will identify two ways to set boundaries in order to achieve balance in their lives:  ?Patient will demonstrate ability to communicate their needs through discussion and/or role plays ? ?Summary of Patient Progress: ?Patient did not attend group despite encouraged participation.  ? ? ? ?Therapeutic Modalities:   ?Cognitive Behavioral Therapy ?Solution-Focused Therapy ?Assertiveness Training ? ? ?Durenda Hurt, LCSWA ?

## 2021-06-30 NOTE — Progress Notes (Signed)
ID PROGRESS NOTE ? ?We will repeat covid testing and order GI panel which is done in house ?

## 2021-06-30 NOTE — Progress Notes (Signed)
[facility] MD Progress Note ? ?06/30/2021 4:16 PM ?Casey Silva  ?MRN:  [id number] ?Subjective: Follow-up patient with depression.  Patient spent most of the day in bed again.  He says he is feeling sick to his stomach but has not thrown up.  Appetite is decreased.  Concern was raised about possible norovirus or COVID and tests were ordered for both of those.  Patient denies active suicidal ideation. ?Principal Problem: Severe recurrent major depression with psychotic features ([place]) ?Diagnosis: Principal Problem: ?  Severe recurrent major depression with psychotic features ([place]) ?Active Problems: ?  Primary insomnia ?  GAD (generalized anxiety disorder) ?  Essential hypertension ? ?Total Time spent with patient: 30 minutes ? ?Past Psychiatric History: Past history of depression ? ?Past Medical History:  ?Past Medical History:  ?Diagnosis Date  ? Anxiety   ? Asthma   ? Depression   ? History reviewed. No pertinent surgical history. ?Family History:  ?Family History  ?Problem Relation Age of Onset  ? Bipolar disorder Mother   ? ?Family Psychiatric  History: See previous ?Social History:  ?Social History  ? ?Substance and Sexual Activity  ?Alcohol Use No  ?   ?Social History  ? ?Substance and Sexual Activity  ?Drug Use No  ?  ?Social History  ? ?Socioeconomic History  ? Marital status: Single  ?  Spouse name: Not on file  ? Number of children: 1  ? Years of education: Not on file  ? Highest education level: GED or equivalent  ?Occupational History  ? Not on file  ?Tobacco Use  ? Smoking status: Never  ? Smokeless tobacco: Never  ?Vaping Use  ? Vaping Use: Unknown  ?Substance and Sexual Activity  ? Alcohol use: No  ? Drug use: No  ? Sexual activity: Not Currently  ?Other Topics Concern  ? Not on file  ?Social History Narrative  ? Marital status:  Single   Children: Daughter   Employment:  Unemployment. previous work for [employer].   Alcohol:  None   Drugs:  none   Exercise:  Regularly very   Religion:  Jewish  ? ?Social  Determinants of Health  ? ?Financial Resource Strain: Not on file  ?Food Insecurity: Not on file  ?Transportation Needs: Not on file  ?Physical Activity: Not on file  ?Stress: Not on file  ?Social Connections: Not on file  ? ?Additional Social History:  ?  ?  ?  ?  ?  ?  ?  ?  ?  ?  ?  ? ?Sleep: Fair ? ?Appetite:  Fair ? ?Current Medications: ?Current Facility-Administered Medications  ?Medication Dose Route Frequency Provider Last Rate Last Admin  ? 0.9 %  sodium chloride infusion  500 mL Intravenous Once Kavitha Lansdale, [name], MD      ? acetaminophen (TYLENOL) tablet 650 mg  650 mg Oral Once PRN Darrin Nipper, MD      ? Or  ? acetaminophen (TYLENOL) 160 MG/5ML solution 325-650 mg  325-650 mg Oral Q4H PRN Darrin Nipper, MD      ? amLODipine (NORVASC) tablet 10 mg  10 mg Oral Daily Merrick Maggio, [name], MD   10 mg at 06/30/21 0818  ? diclofenac Sodium (VOLTAREN) 1 % topical gel 2 g  2 g Topical QID Tamara Kenyon, [name], MD   2 g at 06/27/21 1707  ? feeding supplement (ENSURE ENLIVE / ENSURE PLUS) liquid 237 mL  237 mL Oral BID BM Jahzir Strohmeier T, MD   237 mL at  06/30/21 1117  ? gabapentin (NEURONTIN) capsule 400 mg  400 mg Oral TID Janaysha Depaulo, [name], MD   400 mg at 06/30/21 1322  ? glycopyrrolate (ROBINUL) injection 0.1 mg  0.1 mg Intravenous Once Taleen Prosser, Makinsley Schiavi T, MD      ? hydrOXYzine (ATARAX) tablet 50 mg  50 mg Oral Q6H PRN Chaeli Judy, [name], MD   50 mg at 06/30/21 0818  ? ibuprofen (ADVIL) tablet 600 mg  600 mg Oral Q6H PRN Loella Hickle, [name], MD   600 mg at 06/27/21 0915  ? melatonin tablet 5 mg  5 mg Oral QHS Bexlee Bergdoll, [name], MD   5 mg at 06/29/21 2120  ? midazolam (VERSED) injection 2 mg  2 mg Intravenous Once Malakai Schoenherr T, MD      ? mirtazapine (REMERON) tablet 30 mg  30 mg Oral QHS Priya Matsen,  T, MD   30 mg at 06/29/21 2120  ? multivitamin with minerals tablet 1 tablet  1 tablet Oral Daily Charli Halle, [name], MD   1 tablet at 06/30/21 2440  ? ondansetron (ZOFRAN-ODT) disintegrating tablet 4 mg  4 mg Oral Q4H PRN Haddon Fyfe,   T, MD      ? propranolol (INDERAL) tablet 10 mg  10 mg Oral TID Jessamyn Watterson, [name], MD   10 mg at 06/30/21 1323  ? QUEtiapine (SEROQUEL) tablet 100 mg  100 mg Oral QHS Priscila Bean, [name], MD   100 mg at 06/29/21 2120  ? QUEtiapine (SEROQUEL) tablet 25 mg  25 mg Oral BID Isis Costanza, [name], MD   25 mg at 06/30/21 1027  ? traZODone (DESYREL) tablet 100 mg  100 mg Oral QHS Loretta Kluender, [name], MD   100 mg at 06/29/21 2120  ? traZODone (DESYREL) tablet 100 mg  100 mg Oral QHS PRN , [name], MD   100 mg at 06/25/21 2209  ? venlafaxine XR (EFFEXOR-XR) 24 hr capsule 112.5 mg  112.5 mg Oral Q breakfast Mana Haberl T, MD   112.5 mg at 06/30/21 0818  ? Vitamin D (Ergocalciferol) (DRISDOL) capsule 50,000 Units  50,000 Units Oral Q7 days , [name], MD   50,000 Units at 06/25/21 1704  ? ? ?Lab Results:  ?Results for orders placed or performed during the hospital encounter of 06/23/21 (from the past 48 hour(s))  ?Glucose, capillary     Status: None  ? Collection Time: 06/29/21  6:54 AM  ?Result Value Ref Range  ? Glucose-Capillary 88 70 - 99 mg/dL  ?  Comment: Glucose reference range applies only to samples taken after fasting for at least 8 hours.  ?Glucose, capillary     Status: None  ? Collection Time: 06/29/21 11:10 AM  ?Result Value Ref Range  ? Glucose-Capillary 96 70 - 99 mg/dL  ?  Comment: Glucose reference range applies only to samples taken after fasting for at least 8 hours.  ? ? ?Blood Alcohol level:  ?Lab Results  ?Component Value Date  ? ETH <10 06/07/2021  ? ETH <10 11/22/2020  ? ? ?Metabolic Disorder Labs: ?Lab Results  ?Component Value Date  ? HGBA1C 5.1 06/07/2021  ? MPG 99.67 06/07/2021  ? MPG 111.15 11/24/2020  ? ?No results found for: PROLACTIN ?Lab Results  ?Component Value Date  ? CHOL 197 06/07/2021  ? TRIG 149 06/07/2021  ? HDL 44 06/07/2021  ? CHOLHDL 4.5 06/07/2021  ? VLDL 30 06/07/2021  ? LDLCALC 123 (H) 06/07/2021  ? LDLCALC 148 (H) 11/24/2020  ? ? ?Physical Findings: ?AIMS:  , ,  ,  ,    ?  CIWA:     ?COWS:    ? ?Musculoskeletal: ?Strength & Muscle Tone: within normal limits ?Gait & Station: normal ?Patient leans: N/A ? ?Psychiatric Specialty Exam: ? ?Presentation  ?General Appearance: Appropriate for Environment; Bizarre; Disheveled; Fairly Groomed ? ?Eye Contact:Good ? ?Speech:Clear and Coherent; Normal Rate ? ?Speech Volume:Normal ? ?Handedness:Right ? ? ?Mood and Affect  ?Mood:Anxious; Depressed; Hopeless; Irritable ? ?Affect:Blunt; Depressed ? ? ?Thought Process  ?Thought Processes:Coherent; Linear ? ?Descriptions of Associations:Intact ? ?Orientation:Full (Time, Place and Person) ? ?Thought Content:Logical; WDL ? ?History of Schizophrenia/Schizoaffective disorder:No ? ?Duration of Psychotic Symptoms:N/A ? ?Hallucinations:No data recorded ?Ideas of Reference:None ? ?Suicidal Thoughts:No data recorded ?Homicidal Thoughts:No data recorded ? ?Sensorium  ?Memory:Immediate Fair; Recent Fair; Remote Fair ? ?Judgment:Fair ? ?Insight:Fair ? ? ?Executive Functions  ?Concentration:Fair ? ?Attention Span:Good ? ?Recall:Good ? ?[place] ? ?Language:Good ? ? ?Psychomotor Activity  ?Psychomotor Activity:No data recorded ? ?Assets  ?Assets:Communication Skills; Physical Health; Social Support ? ? ?Sleep  ?Sleep:No data recorded ? ? ?Physical Exam: ?Physical Exam ?Vitals and nursing note reviewed.  ?Constitutional:   ?   Appearance: Normal appearance.  ?HENT:  ?   Head: Normocephalic and atraumatic.  ?   Mouth/Throat:  ?   Pharynx: Oropharynx is clear.  ?Eyes:  ?   Pupils: Pupils are equal, round, and reactive to light.  ?Cardiovascular:  ?   Rate and Rhythm: Normal rate and regular rhythm.  ?Pulmonary:  ?   Effort: Pulmonary effort is normal.  ?   Breath sounds: Normal breath sounds.  ?Abdominal:  ?   General: Abdomen is flat.  ?   Palpations: Abdomen is soft.  ?Musculoskeletal:     ?   General: Normal range of motion.  ?Skin: ?   General: Skin is warm and dry.  ?Neurological:  ?   General: No focal  deficit present.  ?   Mental Status: He is alert. Mental status is at baseline.  ?Psychiatric:     ?   Attention and Perception: Attention normal.     ?   Mood and Affect: Mood normal. Affect is blunt.     ?

## 2021-06-30 NOTE — Progress Notes (Signed)
Recreation Therapy Notes ? ? ?Date: 06/30/2021 ? ?Time: 10:35 am   ? ?Location: Courtyard      ? ?Behavioral response: N/A ?  ?Intervention Topic: Leisure   ? ?Discussion/Intervention: ?Patient refused to attend group.  ? ?Clinical Observations/Feedback:  ?Patient refused to attend group.  ?  ?Falisha Osment LRT/CTRS ? ? ? ? ? ? ? ? ? ?Ryleigh Esqueda ?06/30/2021 12:50 PM ? ? ? ? ? ? ? ?Anaysha Andre ?06/30/2021 12:50 PM ?

## 2021-06-30 NOTE — Progress Notes (Signed)
Patient endorses depression 8/10 for "his overall being". Patient states anxiety 7/10. PRN anxiety medication provided to patient. Patient endorses SI. Patient contracts for safety.  ?Nasopharyngeal swab respiratory panel collected by staff and sent over to lab. ?Stool sample for gastrointestinal panel collected by staff and sent over to lab.  ?Patient on airborne and contact precautions. Patient understands the need for these precautions.  ?Q15 minute safety checks maintained. Patient remains safe on the unit at this time. ? ?

## 2021-07-01 ENCOUNTER — Other Ambulatory Visit: Payer: Self-pay | Admitting: Psychiatry

## 2021-07-01 ENCOUNTER — Encounter: Payer: Self-pay | Admitting: Psychiatry

## 2021-07-01 ENCOUNTER — Ambulatory Visit: Payer: 59

## 2021-07-01 ENCOUNTER — Inpatient Hospital Stay: Payer: 59 | Admitting: Registered Nurse

## 2021-07-01 DIAGNOSIS — E119 Type 2 diabetes mellitus without complications: Secondary | ICD-10-CM | POA: Insufficient documentation

## 2021-07-01 LAB — GLUCOSE, CAPILLARY: Glucose-Capillary: 82 mg/dL (ref 70–99)

## 2021-07-01 MED ORDER — KETAMINE HCL 10 MG/ML IJ SOLN
INTRAMUSCULAR | Status: DC | PRN
Start: 2021-07-01 — End: 2021-07-01
  Administered 2021-07-01: 100 mg via INTRAVENOUS

## 2021-07-01 MED ORDER — LIDOCAINE HCL (PF) 2 % IJ SOLN
INTRAMUSCULAR | Status: AC
  Filled 2021-07-01: qty 5

## 2021-07-01 MED ORDER — SUCCINYLCHOLINE CHLORIDE 200 MG/10ML IV SOSY
PREFILLED_SYRINGE | INTRAVENOUS | Status: DC | PRN
  Administered 2021-07-01: 100 mg via INTRAVENOUS

## 2021-07-01 MED ORDER — KETAMINE HCL 50 MG/5ML IJ SOSY
PREFILLED_SYRINGE | INTRAMUSCULAR | Status: AC
  Filled 2021-07-01: qty 5

## 2021-07-01 MED ORDER — ONDANSETRON HCL 4 MG/2ML IJ SOLN: 4.0000 mg | Freq: Once | INTRAMUSCULAR | Status: DC | PRN

## 2021-07-01 MED ORDER — ACETAMINOPHEN 325 MG PO TABS: 650.0000 mg | ORAL_TABLET | Freq: Once | ORAL | Status: DC | PRN

## 2021-07-01 MED ORDER — SODIUM CHLORIDE 0.9 % IV SOLN: 500.0000 mL | Freq: Once | INTRAVENOUS | Status: AC

## 2021-07-01 MED ORDER — LABETALOL HCL 5 MG/ML IV SOLN
INTRAVENOUS | Status: DC | PRN
  Administered 2021-07-01: 5 mg via INTRAVENOUS

## 2021-07-01 MED ORDER — ACETAMINOPHEN 160 MG/5ML PO SOLN
325.0000 mg | ORAL | Status: DC | PRN
  Filled 2021-07-01: qty 20.3

## 2021-07-01 NOTE — Anesthesia Procedure Notes (Signed)
Date/Time: 07/01/2021 11:00 AM ?Performed by: Hedda Slade, CRNA ?Pre-anesthesia Checklist: Patient identified, Emergency Drugs available, Suction available and Patient being monitored ?Patient Re-evaluated:Patient Re-evaluated prior to induction ?Oxygen Delivery Method: Circle system utilized ?Preoxygenation: Pre-oxygenation with 100% oxygen ?Induction Type: IV induction ?Ventilation: Mask ventilation without difficulty and Mask ventilation throughout procedure ?Airway Equipment and Method: Bite block ?Placement Confirmation: positive ETCO2 ?Dental Injury: Teeth and Oropharynx as per pre-operative assessment  ? ? ? ? ?

## 2021-07-01 NOTE — Plan of Care (Signed)
D: Pt alert and oriented. Pt denies experiencing any anxiety/depression at this time. Pt denies experiencing any pain at this time. Pt denies experiencing any SI/HI, or AVH at this time.  ? ?Pt refused multivitamin this morning, not wanting to upset empty stomach prior to ECT. MD notified. Pt experienced some disorientation after ECT. Pt was unsure as to how they got here and initial reason to admission. ? ?A: Scheduled medications administered to pt, per MD orders. Support and encouragement provided. Frequent verbal contact made. Routine safety checks conducted q15 minutes.  ? ?R: No adverse drug reactions noted. Pt verbally contracts for safety at this time. Pt compliant with medications and treatment plan. Pt interacts minimally with others on the unit. Pt remains safe at this time. Will continue to monitor.  ?Problem: Education: ?Goal: Utilization of techniques to improve thought processes will improve ?Outcome: Progressing ?Goal: Knowledge of the prescribed therapeutic regimen will improve ?Outcome: Progressing ?  ?

## 2021-07-01 NOTE — Progress Notes (Signed)
Recreation Therapy Notes ? ? ?Date: 07/01/2021 ? ?Time: 10:30 am   ? ?Location: Courtyard      ? ?Behavioral response: N/A ?  ?Intervention Topic: Social Skills   ? ?Discussion/Intervention: ?Patient refused to attend group.  ? ?Clinical Observations/Feedback:  ?Patient refused to attend group.  ?  ?Neng Albee LRT/CTRS ? ? ? ? ? ? ? ?Davita Sublett ?07/01/2021 12:40 PM ?

## 2021-07-01 NOTE — Transfer of Care (Signed)
Immediate Anesthesia Transfer of Care Note ? ?Patient: Casey Silva ? ?Procedure(s) Performed: ECT TX ? ?Patient Location: PACU ? ?Anesthesia Type:General ? ?Level of Consciousness: awake, alert  and oriented ? ?Airway & Oxygen Therapy: Patient Spontanous Breathing ? ?Post-op Assessment: Report given to RN and Post -op Vital signs reviewed and stable ? ?Post vital signs: Reviewed and stable ? ?Last Vitals:  ?Vitals Value Taken Time  ?BP 124/88 07/01/21 1115  ?Temp    ?Pulse 70 07/01/21 1116  ?Resp 13 07/01/21 1116  ?SpO2 99 % 07/01/21 1116  ?Vitals shown include unvalidated device data. ? ?Last Pain:  ?Vitals:  ? 07/01/21 1112  ?TempSrc:   ?PainSc: 0-No pain  ?   ? ?Patients Stated Pain Goal:  (no) (06/27/21 0915) ? ?Complications: No notable events documented. ?

## 2021-07-01 NOTE — Plan of Care (Signed)
?  Problem: Group Participation Goal: STG - Patient will engage in groups without prompting or encouragement from LRT x3 group sessions within 5 recreation therapy group sessions Description: STG - Patient will engage in groups without prompting or encouragement from LRT x3 group sessions within 5 recreation therapy group sessions Outcome: Not Progressing   

## 2021-07-01 NOTE — Anesthesia Postprocedure Evaluation (Signed)
Anesthesia Post Note ? ?Patient: Rihan Schueler ? ?Procedure(s) Performed: ECT TX ? ?Patient location during evaluation: PACU ?Anesthesia Type: General ?Level of consciousness: awake and alert, oriented and patient cooperative ?Pain management: pain level controlled ?Vital Signs Assessment: post-procedure vital signs reviewed and stable ?Respiratory status: spontaneous breathing, nonlabored ventilation and respiratory function stable ?Cardiovascular status: blood pressure returned to baseline and stable ?Postop Assessment: adequate PO intake ?Anesthetic complications: no ? ? ?No notable events documented. ? ? ?Last Vitals:  ?Vitals:  ? 07/01/21 1112 07/01/21 1115  ?BP: 129/81 124/88  ?Pulse: 63 65  ?Resp: 11 13  ?Temp: 36.7 ?C   ?SpO2: 99% 99%  ?  ?Last Pain:  ?Vitals:  ? 07/01/21 1115  ?TempSrc:   ?PainSc: 0-No pain  ? ? ?  ?  ?  ?  ?  ?  ? ?Darrin Nipper ? ? ? ? ?

## 2021-07-01 NOTE — Progress Notes (Signed)
Patient calm and pleasant during assessment denying HI/AVH. Pt endorses SI, verbally contracts for safety. Patient given education about ECT tomorrow. Patient compliant with medication administration per MD orders. Pt given education, support, and encouragement to be active in his treatment plan. Pt being monitored Q 15 minutes for safety per unit protocol. Pt remains safe on the unit. ?

## 2021-07-01 NOTE — H&P (Signed)
Casey Silva is an 66 y.o. male.   ?Chief Complaint: Patient comes continues to complain of low energy and depression but no suicidal ideation and no psychotic symptoms. ?HPI: Recurrent severe withdrawal and depression ? ?Past Medical History:  ?Diagnosis Date  ? Anxiety   ? Asthma   ? Depression   ? ? ?History reviewed. No pertinent surgical history. ? ?Family History  ?Problem Relation Age of Onset  ? Bipolar disorder Mother   ? ?Social History:  reports that he has never smoked. He has never used smokeless tobacco. He reports that he does not drink alcohol and does not use drugs. ? ?Allergies:  ?Allergies  ?Allergen Reactions  ? Dust Mite Mixed Allergen Ext [Mite (D. Farinae)] Shortness Of Breath  ? Bee Pollen Other (See Comments)  ?  Watery Eyes  ? ? ?Medications Prior to Admission  ?Medication Sig Dispense Refill  ? amLODipine (NORVASC) 10 MG tablet Take 1 tablet (10 mg total) by mouth daily. 30 tablet 0  ? gabapentin (NEURONTIN) 400 MG capsule Take 1 capsule (400 mg total) by mouth 3 (three) times daily. 90 capsule 0  ? LORazepam (ATIVAN) 0.5 MG tablet Take 1 tablet (0.5 mg total) by mouth 2 (two) times daily as needed for anxiety. 30 tablet 0  ? melatonin 5 MG TABS Take 1 tablet (5 mg total) by mouth at bedtime. 30 tablet 0  ? mirtazapine (REMERON) 30 MG tablet Take 1 tablet (30 mg total) by mouth at bedtime. 30 tablet 3  ? propranolol (INDERAL) 10 MG tablet Take 1 tablet (10 mg total) by mouth 3 (three) times daily. 90 tablet 0  ? QUEtiapine (SEROQUEL) 100 MG tablet Take 1 tablet (100 mg total) by mouth at bedtime. 30 tablet 0  ? QUEtiapine (SEROQUEL) 25 MG tablet Take 1 tablet (25 mg total) by mouth 2 (two) times daily. 60 tablet 0  ? traZODone (DESYREL) 100 MG tablet Take 1 tablet (100 mg total) by mouth at bedtime. 30 tablet 0  ? venlafaxine XR (EFFEXOR-XR) 37.5 MG 24 hr capsule Take 3 capsules (112.5 mg total) by mouth daily with breakfast. 30 capsule 0  ? Vitamin D, Ergocalciferol, (DRISDOL) 1.25 MG  (50000 UNIT) CAPS capsule Take 1 capsule (50,000 Units total) by mouth every 7 (seven) days. 5 capsule 0  ? ? ?Results for orders placed or performed during the hospital encounter of 06/23/21 (from the past 48 hour(s))  ?HIV Antibody (routine testing w rflx)     Status: None  ? Collection Time: 06/30/21  2:05 PM  ?Result Value Ref Range  ? HIV Screen 4th Generation wRfx Non Reactive Non Reactive  ?  Comment: Performed at [facility], [place] [address] [place], [place] [address]  ?Respiratory (~20 pathogens) panel by PCR     Status: None  ? Collection Time: 06/30/21  3:23 PM  ? Specimen: Nasopharyngeal Swab; Respiratory  ?Result Value Ref Range  ? Adenovirus NOT DETECTED NOT DETECTED  ? Coronavirus 229E NOT DETECTED NOT DETECTED  ?  Comment: (NOTE) ?The Coronavirus on the Respiratory Panel, DOES NOT test for the novel  ?Coronavirus (2019 nCoV) ?  ? Coronavirus HKU1 NOT DETECTED NOT DETECTED  ? Coronavirus NL63 NOT DETECTED NOT DETECTED  ? Coronavirus OC43 NOT DETECTED NOT DETECTED  ? Metapneumovirus NOT DETECTED NOT DETECTED  ? Rhinovirus / Enterovirus NOT DETECTED NOT DETECTED  ? Influenza A NOT DETECTED NOT DETECTED  ? Influenza B NOT DETECTED NOT DETECTED  ? Parainfluenza Virus 1 NOT DETECTED  NOT DETECTED  ? Parainfluenza Virus 2 NOT DETECTED NOT DETECTED  ? Parainfluenza Virus 3 NOT DETECTED NOT DETECTED  ? Parainfluenza Virus 4 NOT DETECTED NOT DETECTED  ? Respiratory Syncytial Virus NOT DETECTED NOT DETECTED  ? Bordetella pertussis NOT DETECTED NOT DETECTED  ? Bordetella Parapertussis NOT DETECTED NOT DETECTED  ? Chlamydophila pneumoniae NOT DETECTED NOT DETECTED  ? Mycoplasma pneumoniae NOT DETECTED NOT DETECTED  ?  Comment: Performed at [facility], [place] [address] [place], [place] [address]  ?Resp Panel by RT-PCR (Flu A&B, Covid) Nasopharyngeal Swab     Status: None  ? Collection Time: 06/30/21  3:23 PM  ? Specimen: Nasopharyngeal Swab; Nasopharyngeal(NP) swabs in vial transport medium  ?Result  Value Ref Range  ? SARS Coronavirus 2 by RT PCR NEGATIVE NEGATIVE  ?  Comment: (NOTE) ?SARS-CoV-2 target nucleic acids are NOT DETECTED. ? ?The SARS-CoV-2 RNA is generally detectable in upper respiratory ?specimens during the acute phase of infection. The lowest ?concentration of SARS-CoV-2 viral copies this assay can detect is ?138 copies/mL. A negative result does not preclude SARS-Cov-2 ?infection and should not be used as the sole basis for treatment or ?other patient management decisions. A negative result may occur with  ?improper specimen collection/handling, submission of specimen other ?than nasopharyngeal swab, presence of viral mutation(s) within the ?areas targeted by this assay, and inadequate number of viral ?copies(<138 copies/mL). A negative result must be combined with ?clinical observations, patient history, and epidemiological ?information. The expected result is Negative. ? ?Fact Sheet for Patients:  ?EntrepreneurPulse.com.au ? ?Fact Sheet for Healthcare Providers:  ?IncredibleEmployment.be ? ?This test is no t yet approved or cleared by the [place] FDA and  ?has been authorized for detection and/or diagnosis of SARS-CoV-2 by ?FDA under an Emergency Use Authorization (EUA). This EUA will remain  ?in effect (meaning this test can be used) for the duration of the ?COVID-19 declaration under Section 564(b)(1) of the Act, 21 ?U.S.C.section 360bbb-3(b)(1), unless the authorization is terminated  ?or revoked sooner.  ? ? ?  ? Influenza A by PCR NEGATIVE NEGATIVE  ? Influenza B by PCR NEGATIVE NEGATIVE  ?  Comment: (NOTE) ?The Xpert Xpress SARS-CoV-2/FLU/RSV plus assay is intended as an aid ?in the diagnosis of influenza from Nasopharyngeal swab specimens and ?should not be used as a sole basis for treatment. Nasal washings and ?aspirates are unacceptable for Xpert Xpress SARS-CoV-2/FLU/RSV ?testing. ? ?Fact Sheet for  Patients: ?EntrepreneurPulse.com.au ? ?Fact Sheet for Healthcare Providers: ?IncredibleEmployment.be ? ?This test is not yet approved or cleared by the [place] FDA and ?has been authorized for detection and/or diagnosis of SARS-CoV-2 by ?FDA under an Emergency Use Authorization (EUA). This EUA will remain ?in effect (meaning this test can be used) for the duration of the ?COVID-19 declaration under Section 564(b)(1) of the Act, 21 U.S.C. ?section 360bbb-3(b)(1), unless the authorization is terminated or ?revoked. ? ?Performed at [facility], [place], ?[place] [address] ?  ?Gastrointestinal Panel by PCR , Stool     Status: None  ? Collection Time: 06/30/21  6:57 PM  ? Specimen: Stool  ?Result Value Ref Range  ? Campylobacter species NOT DETECTED NOT DETECTED  ? Plesimonas shigelloides NOT DETECTED NOT DETECTED  ? Salmonella species NOT DETECTED NOT DETECTED  ? Yersinia enterocolitica NOT DETECTED NOT DETECTED  ? Vibrio species NOT DETECTED NOT DETECTED  ? Vibrio cholerae NOT DETECTED NOT DETECTED  ? Enteroaggregative E coli (EAEC) NOT DETECTED NOT DETECTED  ? Enteropathogenic  E coli (EPEC) NOT DETECTED NOT DETECTED  ? Enterotoxigenic E coli (ETEC) NOT DETECTED NOT DETECTED  ? Shiga like toxin producing E coli (STEC) NOT DETECTED NOT DETECTED  ? Shigella/Enteroinvasive E coli (EIEC) NOT DETECTED NOT DETECTED  ? Cryptosporidium NOT DETECTED NOT DETECTED  ? Cyclospora cayetanensis NOT DETECTED NOT DETECTED  ? Entamoeba histolytica NOT DETECTED NOT DETECTED  ? Giardia lamblia NOT DETECTED NOT DETECTED  ? Adenovirus F40/41 NOT DETECTED NOT DETECTED  ? Astrovirus NOT DETECTED NOT DETECTED  ? Norovirus GI/GII NOT DETECTED NOT DETECTED  ? Rotavirus A NOT DETECTED NOT DETECTED  ? Sapovirus (I, II, IV, and V) NOT DETECTED NOT DETECTED  ?  Comment: Performed at [facility], [address], [place], [place] [address]  ?Glucose, capillary     Status:  None  ? Collection Time: 07/01/21  6:45 AM  ?Result Value Ref Range  ? Glucose-Capillary 82 70 - 99 mg/dL  ?  Comment: Glucose reference range applies only to samples taken after fasting for at least 8 hours.  ? ?No results found. ? ?Review of Systems

## 2021-07-01 NOTE — Anesthesia Preprocedure Evaluation (Signed)
Anesthesia Evaluation  ?Patient identified by MRN, date of birth, ID band ?Patient awake ? ? ? ?Reviewed: ?Allergy & Precautions, NPO status , Patient's Chart, lab work & pertinent test results ? ?History of Anesthesia Complications ?Negative for: history of anesthetic complications ? ?Airway ?Mallampati: III ? ? ?Neck ROM: Full ? ? ? Dental ? ? ?Missing all upper teeth except 3:   ?Pulmonary ?asthma , COPD,  ?  ?Pulmonary exam normal ?breath sounds clear to auscultation ? ? ? ? ? ? Cardiovascular ?hypertension, Normal cardiovascular exam ?Rhythm:Regular Rate:Normal ? ?ECG 06/23/21: normal ?  ?Neuro/Psych ?PSYCHIATRIC DISORDERS Anxiety Depression negative neurological ROS ?   ? GI/Hepatic ?negative GI ROS,   ?Endo/Other  ?negative endocrine ROS ? Renal/GU ?negative Renal ROS  ? ?  ?Musculoskeletal ? ? Abdominal ?  ?Peds ? Hematology ?negative hematology ROS ?(+)   ?Anesthesia Other Findings ? ? Reproductive/Obstetrics ? ?  ? ? ? ? ? ? ? ? ? ? ? ? ? ?  ?  ? ? ? ? ? ? ? ? ?Anesthesia Physical ? ?Anesthesia Plan ? ?ASA: 3 ? ?Anesthesia Plan: General  ? ?Post-op Pain Management:   ? ?Induction: Intravenous ? ?PONV Risk Score and Plan: 2 and TIVA and Treatment may vary due to age or medical condition ? ?Airway Management Planned: Natural Airway ? ?Additional Equipment:  ? ?Intra-op Plan:  ? ?Post-operative Plan:  ? ?Informed Consent: I have reviewed the patients History and Physical, chart, labs and discussed the procedure including the risks, benefits and alternatives for the proposed anesthesia with the patient or authorized representative who has indicated his/her understanding and acceptance.  ? ? ? ? ? ?Plan Discussed with: CRNA ? ?Anesthesia Plan Comments: (LMA/GETA backup discussed.  Serial consent on chart.  Patient consented for risks of anesthesia including but not limited to:  ?- adverse reactions to medications ?- damage to eyes, teeth, lips or other oral mucosa ?- nerve  damage due to positioning  ?- sore throat or hoarseness ?- damage to heart, brain, nerves, lungs, other parts of body or loss of life ? ?Informed patient about role of CRNA in peri- and intra-operative care.  Patient voiced understanding.)  ? ? ? ? ? ? ?Anesthesia Quick Evaluation ? ?

## 2021-07-01 NOTE — Procedures (Addendum)
ECT SERVICES ?Physician?s Interval Evaluation & Treatment Note ? ?Patient Identification: Casey Silva ?MRN:  [id number] ?Date of Evaluation:  07/01/2021 ?[place] #: 3 ? ?MADRS:  ? ?MMSE:  ? ?P.E. Findings: ? ?No real change to physical exam ? ?Psychiatric Interval Note: ? ?Possibly a little more interactive. ? ?Subjective: ? ?Patient is a 66 y.o. male seen for evaluation for Electroconvulsive Therapy. ?Subjectively not reporting feeling any worse at least ? ?Treatment Summary: ? ? '[]'$   Right Unilateral             '[x]'$  Bilateral ?  ?% Energy : 1.0 ms 100% ? ? Impedance: 910 ohms ? ?Seizure Energy Index: 7777 ?V squared ? ?Postictal Suppression Index: 75% ? ?Seizure Concordance Index: 99% ? ?Medications ? ?Pre Shock: Ketamine 100 mg succinylcholine 100 mg ? ?Post Shock:   ? ?Seizure Duration: 26 seconds EMG 42 seconds EEG ? ? ?Comments: ?Next treatment Monday ? ?Lungs: ? ?'[x]'$   Clear to auscultation              ? ?'[]'$  Other:  ? ?Heart: ?  ? '[x]'$   Regular rhythm             '[]'$  irregular rhythm ? ? ? '[x]'$   Previous H&P reviewed, patient examined and there are NO CHANGES              ?  ? '[]'$   Previous H&P reviewed, patient examined and there are changes noted. ? ? ?Alethia Berthold, MD ?3/24/20235:44 PM ? ?  ?

## 2021-07-01 NOTE — Progress Notes (Signed)
[facility] MD Progress Note ? ?07/01/2021 5:11 PM ?Casey Silva  ?MRN:  [id number] ?Subjective: Follow-up for this 66 year old man with severe recurrent depression.  3rd ECT treatment today.  Woke up from ECT confused but it is cleared up.  Once again kind of nauseous which suggests to me we should give him Zofran prior to treatment next time.  Still depressed but no suicidal ideation showing some signs of improved affect at times ?Principal Problem: Severe recurrent major depression with psychotic features ([place]) ?Diagnosis: Principal Problem: ?  Severe recurrent major depression with psychotic features ([place]) ?Active Problems: ?  Primary insomnia ?  GAD (generalized anxiety disorder) ?  Essential hypertension ? ?Total Time spent with patient: 30 minutes ? ?Past Psychiatric History: Past history of recurrent depression ? ?Past Medical History:  ?Past Medical History:  ?Diagnosis Date  ? Anxiety   ? Asthma   ? Depression   ? History reviewed. No pertinent surgical history. ?Family History:  ?Family History  ?Problem Relation Age of Onset  ? Bipolar disorder Mother   ? ?Family Psychiatric  History: See previous ?Social History:  ?Social History  ? ?Substance and Sexual Activity  ?Alcohol Use No  ?   ?Social History  ? ?Substance and Sexual Activity  ?Drug Use No  ?  ?Social History  ? ?Socioeconomic History  ? Marital status: Single  ?  Spouse name: Not on file  ? Number of children: 1  ? Years of education: Not on file  ? Highest education level: GED or equivalent  ?Occupational History  ? Not on file  ?Tobacco Use  ? Smoking status: Never  ? Smokeless tobacco: Never  ?Vaping Use  ? Vaping Use: Unknown  ?Substance and Sexual Activity  ? Alcohol use: No  ? Drug use: No  ? Sexual activity: Not Currently  ?Other Topics Concern  ? Not on file  ?Social History Narrative  ? Marital status:  Single   Children: Daughter   Employment:  Unemployment. previous work for [employer].   Alcohol:  None   Drugs:  none   Exercise:   Regularly very   Religion:  Jewish  ? ?Social Determinants of Health  ? ?Financial Resource Strain: Not on file  ?Food Insecurity: Not on file  ?Transportation Needs: Not on file  ?Physical Activity: Not on file  ?Stress: Not on file  ?Social Connections: Not on file  ? ?Additional Social History:  ?  ?  ?  ?  ?  ?  ?  ?  ?  ?  ?  ? ?Sleep: Fair ? ?Appetite:  Fair ? ?Current Medications: ?Current Facility-Administered Medications  ?Medication Dose Route Frequency Provider Last Rate Last Admin  ? 0.9 %  sodium chloride infusion  500 mL Intravenous Once Congetta Odriscoll, [name], MD      ? acetaminophen (TYLENOL) tablet 650 mg  650 mg Oral Once PRN Darrin Nipper, MD      ? Or  ? acetaminophen (TYLENOL) 160 MG/5ML solution 325-650 mg  325-650 mg Oral Q4H PRN Darrin Nipper, MD      ? amLODipine (NORVASC) tablet 10 mg  10 mg Oral Daily Maryjane Benedict, [name], MD   10 mg at 07/01/21 7209  ? diclofenac Sodium (VOLTAREN) 1 % topical gel 2 g  2 g Topical QID Jamie Hafford, [name], MD   2 g at 06/27/21 1707  ? feeding supplement (ENSURE ENLIVE / ENSURE PLUS) liquid 237 mL  237 mL Oral BID BM Kaicen Desena, [name], MD  237 mL at 07/01/21 1405  ? gabapentin (NEURONTIN) capsule 400 mg  400 mg Oral TID Petra Dumler, [name], MD   400 mg at 07/01/21 1247  ? glycopyrrolate (ROBINUL) injection 0.1 mg  0.1 mg Intravenous Once Gilman Olazabal T, MD      ? hydrOXYzine (ATARAX) tablet 50 mg  50 mg Oral Q6H PRN Jamell Opfer, [name], MD   50 mg at 06/30/21 0818  ? ibuprofen (ADVIL) tablet 600 mg  600 mg Oral Q6H PRN Krue Peterka, [name], MD   600 mg at 06/27/21 0915  ? melatonin tablet 5 mg  5 mg Oral QHS Isley Weisheit, [name], MD   5 mg at 06/30/21 2150  ? midazolam (VERSED) injection 2 mg  2 mg Intravenous Once Shalandria Elsbernd T, MD      ? mirtazapine (REMERON) tablet 30 mg  30 mg Oral QHS Selso Mannor T, MD   30 mg at 06/30/21 2150  ? multivitamin with minerals tablet 1 tablet  1 tablet Oral Daily Kori Goins, [name], MD   1 tablet at 06/30/21 5956  ? ondansetron (ZOFRAN) injection 4 mg   4 mg Intravenous Once PRN Darrin Nipper, MD      ? ondansetron (ZOFRAN-ODT) disintegrating tablet 4 mg  4 mg Oral Q4H PRN Keeghan Bialy, [name], MD      ? propranolol (INDERAL) tablet 10 mg  10 mg Oral TID Lennis Rader, [name], MD   10 mg at 07/01/21 1402  ? QUEtiapine (SEROQUEL) tablet 100 mg  100 mg Oral QHS Oswin Johal, [name], MD   100 mg at 06/30/21 2149  ? QUEtiapine (SEROQUEL) tablet 25 mg  25 mg Oral BID Raiya Stainback, [name], MD   25 mg at 07/01/21 0836  ? traZODone (DESYREL) tablet 100 mg  100 mg Oral QHS Latima Hamza, [name], MD   100 mg at 06/30/21 2149  ? traZODone (DESYREL) tablet 100 mg  100 mg Oral QHS PRN Kesley Mullens, [name], MD   100 mg at 06/25/21 2209  ? venlafaxine XR (EFFEXOR-XR) 24 hr capsule 112.5 mg  112.5 mg Oral Q breakfast Syler Norcia T, MD   112.5 mg at 07/01/21 0836  ? Vitamin D (Ergocalciferol) (DRISDOL) capsule 50,000 Units  50,000 Units Oral Q7 days Treysen Sudbeck, [name], MD   50,000 Units at 06/25/21 1704  ? ? ?Lab Results:  ?Results for orders placed or performed during the hospital encounter of 06/23/21 (from the past 48 hour(s))  ?HIV Antibody (routine testing w rflx)     Status: None  ? Collection Time: 06/30/21  2:05 PM  ?Result Value Ref Range  ? HIV Screen 4th Generation wRfx Non Reactive Non Reactive  ?  Comment: Performed at [facility], [place] [address] [place], [place] [address]  ?Respiratory (~20 pathogens) panel by PCR     Status: None  ? Collection Time: 06/30/21  3:23 PM  ? Specimen: Nasopharyngeal Swab; Respiratory  ?Result Value Ref Range  ? Adenovirus NOT DETECTED NOT DETECTED  ? Coronavirus 229E NOT DETECTED NOT DETECTED  ?  Comment: (NOTE) ?The Coronavirus on the Respiratory Panel, DOES NOT test for the novel  ?Coronavirus (2019 nCoV) ?  ? Coronavirus HKU1 NOT DETECTED NOT DETECTED  ? Coronavirus NL63 NOT DETECTED NOT DETECTED  ? Coronavirus OC43 NOT DETECTED NOT DETECTED  ? Metapneumovirus NOT DETECTED NOT DETECTED  ? Rhinovirus / Enterovirus NOT DETECTED NOT DETECTED  ? Influenza A NOT  DETECTED NOT DETECTED  ? Influenza B NOT DETECTED NOT DETECTED  ?  Parainfluenza Virus 1 NOT DETECTED NOT DETECTED  ? Parainfluenza Virus 2 NOT DETECTED NOT DETECTED  ? Parainfluenza Virus 3 NOT DETECTED NOT DETECTED  ? Parainfluenza Virus 4 NOT DETECTED NOT DETECTED  ? Respiratory Syncytial Virus NOT DETECTED NOT DETECTED  ? Bordetella pertussis NOT DETECTED NOT DETECTED  ? Bordetella Parapertussis NOT DETECTED NOT DETECTED  ? Chlamydophila pneumoniae NOT DETECTED NOT DETECTED  ? Mycoplasma pneumoniae NOT DETECTED NOT DETECTED  ?  Comment: Performed at [facility], [place] [address] [place], [place] [address]  ?Resp Panel by RT-PCR (Flu A&B, Covid) Nasopharyngeal Swab     Status: None  ? Collection Time: 06/30/21  3:23 PM  ? Specimen: Nasopharyngeal Swab; Nasopharyngeal(NP) swabs in vial transport medium  ?Result Value Ref Range  ? SARS Coronavirus 2 by RT PCR NEGATIVE NEGATIVE  ?  Comment: (NOTE) ?SARS-CoV-2 target nucleic acids are NOT DETECTED. ? ?The SARS-CoV-2 RNA is generally detectable in upper respiratory ?specimens during the acute phase of infection. The lowest ?concentration of SARS-CoV-2 viral copies this assay can detect is ?138 copies/mL. A negative result does not preclude SARS-Cov-2 ?infection and should not be used as the sole basis for treatment or ?other patient management decisions. A negative result may occur with  ?improper specimen collection/handling, submission of specimen other ?than nasopharyngeal swab, presence of viral mutation(s) within the ?areas targeted by this assay, and inadequate number of viral ?copies(<138 copies/mL). A negative result must be combined with ?clinical observations, patient history, and epidemiological ?information. The expected result is Negative. ? ?Fact Sheet for Patients:  ?EntrepreneurPulse.com.au ? ?Fact Sheet for Healthcare Providers:  ?IncredibleEmployment.be ? ?This test is no t yet approved or cleared by the [place] FDA and  ?has been authorized for detection and/or diagnosis of SARS-CoV-2 by ?FDA under an Emergency Use Authorization (EUA). This EUA will remain  ?in effect (meaning this test can be used) for t

## 2021-07-02 NOTE — Group Note (Signed)
[facility] LCSW Group Therapy Note ? ? ?Group Date: 07/02/2021 ?Start Time: 1300 ?End Time: 1400 ? ?Type of Therapy and Topic:  Group Therapy:  Feelings around Relapse and Recovery ? ?Participation Level:  Did Not Attend  ? ?Mood: ? ?Description of Group:   ? Patients in this group will discuss emotions they experience before and after a relapse. They will process how experiencing these feelings, or avoidance of experiencing them, relates to having a relapse. Facilitator will guide patients to explore emotions they have related to recovery. Patients will be encouraged to process which emotions are more powerful. They will be guided to discuss the emotional reaction significant others in their lives may have to patients? relapse or recovery. Patients will be assisted in exploring ways to respond to the emotions of others without this contributing to a relapse. ? ?Therapeutic Goals: ?Patient will identify two or more emotions that lead to relapse for them:  ?Patient will identify two emotions that result when they relapse:  ?Patient will identify two emotions related to recovery:  ?Patient will demonstrate ability to communicate their needs through discussion and/or role plays. ? ? ?Summary of Patient Progress: Patient did not attend group despite encouraged participation. ? ? ?Therapeutic Modalities:   ?Cognitive Behavioral Therapy ?Solution-Focused Therapy ?Assertiveness Training ?Relapse Prevention Therapy ? ? ?Kenna Gilbert [place], LCSWA ?

## 2021-07-02 NOTE — Progress Notes (Signed)
[facility] MD Progress Note ? ?07/02/2021 11:21 AM ?Casey Silva  ?MRN:  [id number] ? ?Principal Problem: Severe recurrent major depression with psychotic features ([place]) ?Diagnosis: Principal Problem: ?  Severe recurrent major depression with psychotic features ([place]) ?Active Problems: ?  Primary insomnia ?  GAD (generalized anxiety disorder) ?  Essential hypertension ? ?Patient is a  65y.o. man who presents to the [facility] unit for ECT treatments in the context of severe depression.  ? ? ?Interval History ?Patient was seen today for re-evaluation.  Nursing reports no events overnight. The patient has no issues with performing ADLs.  Patient has been medication compliant.   ? ?Subjective:  On assessment patient reports "I feel a little better" - reports feeling slightly less depressed and not suicidal today. His sleep was fair. He denies any adverse reactions of ECT or side effects from psych medications. He denies any physical complaints. No new mental complaints. ? ?Current suicidal/homicidal ideations: Denies ?Current auditory/visual hallucinations: Denies ?The patient reports no side effects from medications.   ? ?Labs: no new results for review. ? ?  ? ? ?Total Time spent with patient: 20 minutes ? ?Past Psychiatric History:  ? ?Past Medical History:  ?Past Medical History:  ?Diagnosis Date  ? Anxiety   ? Asthma   ? Depression   ? History reviewed. No pertinent surgical history. ?Family History:  ?Family History  ?Problem Relation Age of Onset  ? Bipolar disorder Mother   ? ?Family Psychiatric  History:  ?Social History:  ?Social History  ? ?Substance and Sexual Activity  ?Alcohol Use No  ?   ?Social History  ? ?Substance and Sexual Activity  ?Drug Use No  ?  ?Social History  ? ?Socioeconomic History  ? Marital status: Single  ?  Spouse name: Not on file  ? Number of children: 1  ? Years of education: Not on file  ? Highest education level: GED or equivalent  ?Occupational History  ? Not on file  ?Tobacco Use  ? Smoking status:  Never  ? Smokeless tobacco: Never  ?Vaping Use  ? Vaping Use: Unknown  ?Substance and Sexual Activity  ? Alcohol use: No  ? Drug use: No  ? Sexual activity: Not Currently  ?Other Topics Concern  ? Not on file  ?Social History Narrative  ? Marital status:  Single   Children: Daughter   Employment:  Unemployment. previous work for [employer].   Alcohol:  None   Drugs:  none   Exercise:  Regularly very   Religion:  Jewish  ? ?Social Determinants of Health  ? ?Financial Resource Strain: Not on file  ?Food Insecurity: Not on file  ?Transportation Needs: Not on file  ?Physical Activity: Not on file  ?Stress: Not on file  ?Social Connections: Not on file  ? ?Additional Social History:  ?  ?  ?  ?  ?  ?  ?  ?  ?  ?  ?  ? ?Sleep: Fair ? ?Appetite:  Fair ? ?Current Medications: ?Current Facility-Administered Medications  ?Medication Dose Route Frequency Provider Last Rate Last Admin  ? 0.9 %  sodium chloride infusion  500 mL Intravenous Once Clapacs, [name], MD      ? acetaminophen (TYLENOL) tablet 650 mg  650 mg Oral Once PRN Darrin Nipper, MD      ? Or  ? acetaminophen (TYLENOL) 160 MG/5ML solution 325-650 mg  325-650 mg Oral Q4H PRN Darrin Nipper, MD      ? amLODipine ([place])  tablet 10 mg  10 mg Oral Daily Clapacs, [name], MD   10 mg at 07/02/21 0809  ? diclofenac Sodium (VOLTAREN) 1 % topical gel 2 g  2 g Topical QID Clapacs, [name], MD   2 g at 06/27/21 1707  ? feeding supplement (ENSURE ENLIVE / ENSURE PLUS) liquid 237 mL  237 mL Oral BID BM Clapacs, [name], MD   237 mL at 07/02/21 2197  ? gabapentin (NEURONTIN) capsule 400 mg  400 mg Oral TID Clapacs, [name], MD   400 mg at 07/02/21 0809  ? glycopyrrolate (ROBINUL) injection 0.1 mg  0.1 mg Intravenous Once Clapacs, [name], MD      ? hydrOXYzine (ATARAX) tablet 50 mg  50 mg Oral Q6H PRN Clapacs, [name], MD   50 mg at 06/30/21 0818  ? ibuprofen (ADVIL) tablet 600 mg  600 mg Oral Q6H PRN Clapacs, [name], MD   600 mg at 06/27/21 0915  ? melatonin tablet 5 mg   5 mg Oral QHS Clapacs, [name], MD   5 mg at 06/30/21 2150  ? midazolam (VERSED) injection 2 mg  2 mg Intravenous Once Clapacs, [name], MD      ? mirtazapine (REMERON) tablet 30 mg  30 mg Oral QHS Clapacs, [name], MD   30 mg at 06/30/21 2150  ? multivitamin with minerals tablet 1 tablet  1 tablet Oral Daily Clapacs, [name], MD   1 tablet at 07/02/21 0809  ? ondansetron (ZOFRAN) injection 4 mg  4 mg Intravenous Once PRN Darrin Nipper, MD      ? ondansetron (ZOFRAN-ODT) disintegrating tablet 4 mg  4 mg Oral Q4H PRN Clapacs, [name], MD      ? propranolol (INDERAL) tablet 10 mg  10 mg Oral TID Clapacs, [name], MD   10 mg at 07/02/21 0809  ? QUEtiapine (SEROQUEL) tablet 100 mg  100 mg Oral QHS Clapacs, [name], MD   100 mg at 06/30/21 2149  ? QUEtiapine (SEROQUEL) tablet 25 mg  25 mg Oral BID Clapacs, [name], MD   25 mg at 07/02/21 0809  ? traZODone (DESYREL) tablet 100 mg  100 mg Oral QHS Clapacs, [name], MD   100 mg at 06/30/21 2149  ? traZODone (DESYREL) tablet 100 mg  100 mg Oral QHS PRN Clapacs, [name], MD   100 mg at 06/25/21 2209  ? venlafaxine XR (EFFEXOR-XR) 24 hr capsule 112.5 mg  112.5 mg Oral Q breakfast Clapacs, [name], MD   112.5 mg at 07/02/21 0810  ? Vitamin D (Ergocalciferol) (DRISDOL) capsule 50,000 Units  50,000 Units Oral Q7 days Clapacs, [name], MD   50,000 Units at 06/25/21 1704  ? ? ?Lab Results:  ?Results for orders placed or performed during the hospital encounter of 06/23/21 (from the past 48 hour(s))  ?HIV Antibody (routine testing w rflx)     Status: None  ? Collection Time: 06/30/21  2:05 PM  ?Result Value Ref Range  ? HIV Screen 4th Generation wRfx Non Reactive Non Reactive  ?  Comment: Performed at [facility], [place] [address], [place], [place] [address]  ?Respiratory (~20 pathogens) panel by PCR     Status: None  ? Collection Time: 06/30/21  3:23 PM  ? Specimen: Nasopharyngeal Swab; Respiratory  ?Result Value Ref Range  ? Adenovirus NOT DETECTED NOT DETECTED  ? Coronavirus 229E NOT  DETECTED NOT DETECTED  ?  Comment: (NOTE) ?The Coronavirus on  the Respiratory Panel, DOES NOT test for the novel  ?Coronavirus (2019 nCoV) ?  ? Coronavirus HKU1 NOT DETECTED NOT DETECTED  ? Coronavirus NL63 NOT DETECTED NOT DETECTED  ? Coronavirus OC43 NOT DETECTED NOT DETECTED  ? Metapneumovirus NOT DETECTED NOT DETECTED  ? Rhinovirus / Enterovirus NOT DETECTED NOT DETECTED  ? Influenza A NOT DETECTED NOT DETECTED  ? Influenza B NOT DETECTED NOT DETECTED  ? Parainfluenza Virus 1 NOT DETECTED NOT DETECTED  ? Parainfluenza Virus 2 NOT DETECTED NOT DETECTED  ? Parainfluenza Virus 3 NOT DETECTED NOT DETECTED  ? Parainfluenza Virus 4 NOT DETECTED NOT DETECTED  ? Respiratory Syncytial Virus NOT DETECTED NOT DETECTED  ? Bordetella pertussis NOT DETECTED NOT DETECTED  ? Bordetella Parapertussis NOT DETECTED NOT DETECTED  ? Chlamydophila pneumoniae NOT DETECTED NOT DETECTED  ? Mycoplasma pneumoniae NOT DETECTED NOT DETECTED  ?  Comment: Performed at [facility], [place] [address], [place], [place] [address]  ?Resp Panel by RT-PCR (Flu A&B, Covid) Nasopharyngeal Swab     Status: None  ? Collection Time: 06/30/21  3:23 PM  ? Specimen: Nasopharyngeal Swab; Nasopharyngeal(NP) swabs in vial transport medium  ?Result Value Ref Range  ? SARS Coronavirus 2 by RT PCR NEGATIVE NEGATIVE  ?  Comment: (NOTE) ?SARS-CoV-2 target nucleic acids are NOT DETECTED. ? ?The SARS-CoV-2 RNA is generally detectable in upper respiratory ?specimens during the acute phase of infection. The lowest ?concentration of SARS-CoV-2 viral copies this assay can detect is ?138 copies/mL. A negative result does not preclude SARS-Cov-2 ?infection and should not be used as the sole basis for treatment or ?other patient management decisions. A negative result may occur with  ?improper specimen collection/handling, submission of specimen other ?than nasopharyngeal swab, presence of viral mutation(s) within the ?areas targeted by this assay, and inadequate number  of viral ?copies(<138 copies/mL). A negative result must be combined with ?clinical observations, patient history, and epidemiological ?information. The expected result is Negative. ? ?Fact Sheet for

## 2021-07-02 NOTE — Plan of Care (Signed)
?  Problem: Education: ?Goal: Utilization of techniques to improve thought processes will improve ?Outcome: Progressing ?Goal: Knowledge of the prescribed therapeutic regimen will improve ?Outcome: Progressing ?  ?Problem: Activity: ?Goal: Interest or engagement in leisure activities will improve ?Outcome: Progressing ?Goal: Imbalance in normal sleep/wake cycle will improve ?Outcome: Progressing ?  ?Problem: Coping: ?Goal: Coping ability will improve ?Description: PT IS PROGRESSING. ?Outcome: Progressing ?Goal: Will verbalize feelings ?Outcome: Progressing ?  ?Problem: Health Behavior/Discharge Planning: ?Goal: Ability to make decisions will improve ?Outcome: Progressing ?Goal: Compliance with therapeutic regimen will improve ?Outcome: Progressing ?  ?Problem: Role Relationship: ?Goal: Will demonstrate positive changes in social behaviors and relationships ?Outcome: Progressing ?  ?Problem: Safety: ?Goal: Ability to disclose and discuss suicidal ideas will improve ?Outcome: Progressing ?Goal: Ability to identify and utilize support systems that promote safety will improve ?Outcome: Progressing ?  ?Problem: Self-Concept: ?Goal: Will verbalize positive feelings about self ?Outcome: Progressing ?Goal: Level of anxiety will decrease ?Outcome: Progressing ?  ?

## 2021-07-02 NOTE — Plan of Care (Signed)
D: Pt alert and oriented. Pt rates depression 8/10, hopelessness 8/10, and anxiety 8/10. Pt reports energy level as low and concentration as being poor. Pt reports sleep last night as being fair. Pt did not receive medications for sleep. Pt denies experiencing any pain at this time. Pt denies experiencing any HI, or AVH at this time, however endorses experiencing SI w/o while here. Pt verbally contracts for safety stating he can be safe while here.  ? ?A: Scheduled medications administered to pt, per MD orders. Support and encouragement provided. Frequent verbal contact made. Routine safety checks conducted q15 minutes.  ? ?R: No adverse drug reactions noted. Pt verbally contracts for safety at this time. Pt compliant with medications. Pt interacts minimally with others on the unit. Pt remains safe at this time. Will continue to monitor. ?  ?Problem: Education: ?Goal: Utilization of techniques to improve thought processes will improve ?Outcome: Not Progressing ?  ?Problem: Activity: ?Goal: Interest or engagement in leisure activities will improve ?Outcome: Not Progressing ?  ?

## 2021-07-02 NOTE — Progress Notes (Signed)
Pt isolated to his room, no interaction with peers and minimal with staff. Pt denies SI/HI and AVH. Both anxiety and depression score 7/10, with no report of causative factors.  Post ECT pt reports short term memory loss, such as forgetting the his street address. Pt encouraged to drink fluids and to walk around on the unit for exercise. While in his room, pt has been resting in bed. No complaints. ?

## 2021-07-03 MED ORDER — LOPERAMIDE HCL 2 MG PO CAPS
2.0000 mg | ORAL_CAPSULE | ORAL | Status: DC | PRN
  Administered 2021-07-03 – 2021-07-22 (×2): 2 mg via ORAL
  Filled 2021-07-03 (×2): qty 1

## 2021-07-03 MED ORDER — LOPERAMIDE HCL 2 MG PO CAPS
4.0000 mg | ORAL_CAPSULE | Freq: Once | ORAL | Status: AC
  Administered 2021-07-03: 4 mg via ORAL
  Filled 2021-07-03: qty 2

## 2021-07-03 NOTE — Plan of Care (Signed)
Pt reports experiencing anxiety/depression. Pt is compliant with medications. Pt has had 2 urine (once during the day and once during the night) and 2 bowel (both during day shift) incontinence incidence and reports being unsure why this is happening.  ?Pt isolates to room with exception to meals and snack. ? ?Problem: Activity: ?Goal: Interest or engagement in leisure activities will improve ?07/03/2021 2052 by Aleen Sells, RN ?Outcome: Not Progressing ?07/03/2021 1039 by Aleen Sells, RN ?Outcome: Not Progressing ?  ?Problem: Coping: ?Goal: Coping ability will improve ?Description: PT IS PROGRESSING. ?Outcome: Not Progressing ?  ?Problem: Self-Concept: ?Goal: Level of anxiety will decrease ?Outcome: Not Progressing ?  ?

## 2021-07-03 NOTE — Group Note (Signed)
LCSW Group Therapy Note ? ?Group Date: 07/03/2021 ?Start Time: 1300 ?End Time: 1400 ? ? ?Type of Therapy and Topic:  Group Therapy - Healthy vs Unhealthy Coping Skills ? ?Participation Level:  Did Not Attend  ? ?Description of Group ?The focus of this group was to determine what unhealthy coping techniques typically are used by group members and what healthy coping techniques would be helpful in coping with various problems. Patients were guided in becoming aware of the differences between healthy and unhealthy coping techniques. Patients were asked to identify 2-3 healthy coping skills they would like to learn to use more effectively. ? ?Therapeutic Goals ?Patients learned that coping is what human beings do all day long to deal with various situations in their lives ?Patients defined and discussed healthy vs unhealthy coping techniques ?Patients identified their preferred coping techniques and identified whether these were healthy or unhealthy ?Patients determined 2-3 healthy coping skills they would like to become more familiar with and use more often. ?Patients provided support and ideas to each other ? ? ?Summary of Patient Progress: Due to limited staffing, group was not held on the unit. ? ? ?Therapeutic Modalities ?Cognitive Behavioral Therapy ?Motivational Interviewing ? ?Kenna Gilbert Alburtis, LCSWA ?07/03/2021  2:58 PM   ?

## 2021-07-03 NOTE — Progress Notes (Signed)
[facility] MD Progress Note ? ?07/03/2021 8:16 AM ?Casey Silva  ?MRN:  [id number] ? ?Principal Problem: Severe recurrent major depression with psychotic features ([place]) ?Diagnosis: Principal Problem: ?  Severe recurrent major depression with psychotic features ([place]) ?Active Problems: ?  Primary insomnia ?  GAD (generalized anxiety disorder) ?  Essential hypertension ? ?Patient is a  65y.o. man who presents to the [facility] unit for ECT treatments in the context of severe depression.  ? ? ?Interval History ?Patient was seen today for re-evaluation.  Nursing reports no events overnight. The patient has no issues with performing ADLs.  Patient has been medication compliant.   ? ?Subjective:  On assessment patient reports having two-three episodes of loose stool last night. Denies abdominal pain, cramping, vomiting, other physical complaints. He is afebrile. Imodium ordered. He continues to report "so-so" mood; feeling slightly less depressed and not suicidal today. His sleep was fair. He denies any adverse reactions of ECT or side effects from psych medications. He denies any physical complaints. No new mental complaints. ? ?Current suicidal/homicidal ideations: Denies ?Current auditory/visual hallucinations: Denies ?The patient reports no side effects from medications.   ? ?Labs: no new results for review. ? ?  ? ? ?Total Time spent with patient: 20 minutes ? ?Past Psychiatric History:  ? ?Past Medical History:  ?Past Medical History:  ?Diagnosis Date  ? Anxiety   ? Asthma   ? Depression   ? History reviewed. No pertinent surgical history. ?Family History:  ?Family History  ?Problem Relation Age of Onset  ? Bipolar disorder Mother   ? ?Family Psychiatric  History:  ?Social History:  ?Social History  ? ?Substance and Sexual Activity  ?Alcohol Use No  ?   ?Social History  ? ?Substance and Sexual Activity  ?Drug Use No  ?  ?Social History  ? ?Socioeconomic History  ? Marital status: Single  ?  Spouse name: Not on file  ? Number of  children: 1  ? Years of education: Not on file  ? Highest education level: GED or equivalent  ?Occupational History  ? Not on file  ?Tobacco Use  ? Smoking status: Never  ? Smokeless tobacco: Never  ?Vaping Use  ? Vaping Use: Unknown  ?Substance and Sexual Activity  ? Alcohol use: No  ? Drug use: No  ? Sexual activity: Not Currently  ?Other Topics Concern  ? Not on file  ?Social History Narrative  ? Marital status:  Single   Children: Daughter   Employment:  Unemployment. previous work for [employer].   Alcohol:  None   Drugs:  none   Exercise:  Regularly very   Religion:  Jewish  ? ?Social Determinants of Health  ? ?Financial Resource Strain: Not on file  ?Food Insecurity: Not on file  ?Transportation Needs: Not on file  ?Physical Activity: Not on file  ?Stress: Not on file  ?Social Connections: Not on file  ? ?Additional Social History:  ?  ?  ?  ?  ?  ?  ?  ?  ?  ?  ?  ? ?Sleep: Fair ? ?Appetite:  Fair ? ?Current Medications: ?Current Facility-Administered Medications  ?Medication Dose Route Frequency Provider Last Rate Last Admin  ? 0.9 %  sodium chloride infusion  500 mL Intravenous Once Clapacs, [name], MD      ? acetaminophen (TYLENOL) tablet 650 mg  650 mg Oral Once PRN Darrin Nipper, MD      ? Or  ? acetaminophen (TYLENOL) 160 MG/5ML  solution 325-650 mg  325-650 mg Oral Q4H PRN Darrin Nipper, MD      ? amLODipine (NORVASC) tablet 10 mg  10 mg Oral Daily Clapacs, [name], MD   10 mg at 07/03/21 0753  ? diclofenac Sodium (VOLTAREN) 1 % topical gel 2 g  2 g Topical QID Clapacs, [name], MD   2 g at 07/02/21 2149  ? feeding supplement (ENSURE ENLIVE / ENSURE PLUS) liquid 237 mL  237 mL Oral BID BM Clapacs, [name], MD   237 mL at 07/02/21 1405  ? gabapentin (NEURONTIN) capsule 400 mg  400 mg Oral TID Clapacs, [name], MD   400 mg at 07/03/21 6440  ? glycopyrrolate (ROBINUL) injection 0.1 mg  0.1 mg Intravenous Once Clapacs, [name], MD      ? hydrOXYzine (ATARAX) tablet 50 mg  50 mg Oral Q6H PRN Clapacs,  [name], MD   50 mg at 07/02/21 1207  ? ibuprofen (ADVIL) tablet 600 mg  600 mg Oral Q6H PRN Clapacs, [name], MD   600 mg at 06/27/21 0915  ? loperamide (IMODIUM) capsule 2 mg  2 mg Oral PRN Larita Fife, MD      ? loperamide (IMODIUM) capsule 4 mg  4 mg Oral Once Larita Fife, MD      ? melatonin tablet 5 mg  5 mg Oral QHS Clapacs, [name], MD   5 mg at 07/02/21 2148  ? midazolam (VERSED) injection 2 mg  2 mg Intravenous Once Clapacs, [name], MD      ? mirtazapine (REMERON) tablet 30 mg  30 mg Oral QHS Clapacs, [name], MD   30 mg at 07/02/21 2147  ? multivitamin with minerals tablet 1 tablet  1 tablet Oral Daily Clapacs, [name], MD   1 tablet at 07/03/21 3474  ? ondansetron (ZOFRAN) injection 4 mg  4 mg Intravenous Once PRN Darrin Nipper, MD      ? ondansetron (ZOFRAN-ODT) disintegrating tablet 4 mg  4 mg Oral Q4H PRN Clapacs, [name], MD      ? propranolol (INDERAL) tablet 10 mg  10 mg Oral TID Clapacs, [name], MD   10 mg at 07/03/21 0753  ? QUEtiapine (SEROQUEL) tablet 100 mg  100 mg Oral QHS Clapacs, [name], MD   100 mg at 07/02/21 2148  ? QUEtiapine (SEROQUEL) tablet 25 mg  25 mg Oral BID Clapacs, [name], MD   25 mg at 07/03/21 2595  ? traZODone (DESYREL) tablet 100 mg  100 mg Oral QHS Clapacs, [name], MD   100 mg at 07/02/21 2148  ? traZODone (DESYREL) tablet 100 mg  100 mg Oral QHS PRN Clapacs, [name], MD   100 mg at 06/25/21 2209  ? venlafaxine XR (EFFEXOR-XR) 24 hr capsule 112.5 mg  112.5 mg Oral Q breakfast Clapacs, [name], MD   112.5 mg at 07/03/21 6387  ? Vitamin D (Ergocalciferol) (DRISDOL) capsule 50,000 Units  50,000 Units Oral Q7 days Clapacs, [name], MD   50,000 Units at 07/02/21 1819  ? ? ?Lab Results:  ?No results found for this or any previous visit (from the past 48 hour(s)). ? ? ?Blood Alcohol level:  ?Lab Results  ?Component Value Date  ? ETH <10 06/07/2021  ? ETH <10 11/22/2020  ? ? ?Metabolic Disorder Labs: ?Lab Results  ?Component Value Date  ? HGBA1C 5.1 06/07/2021  ? MPG 99.67 06/07/2021   ? MPG 111.15 11/24/2020  ? ?No results found  for: PROLACTIN ?Lab Results  ?Component Value Date  ? CHOL 197 06/07/2021  ? TRIG 149 06/07/2021  ? HDL 44 06/07/2021  ? CHOLHDL 4.5 06/07/2021  ? VLDL 30 06/07/2021  ? LDLCALC 123 (H) 06/07/2021  ? LDLCALC 148 (H) 11/24/2020  ? ? ?Physical Findings: ?AIMS:  , ,  ,  ,    ?CIWA:    ?COWS:    ? ?Musculoskeletal: ?Strength & Muscle Tone: within normal limits ?Gait & Station: normal ?Patient leans: N/A ? ?Psychiatric Specialty Exam: ? ?Presentation  ?General Appearance: Appropriate for Environment; Bizarre; Disheveled; Fairly Groomed ? ?Eye Contact:Good ? ?Speech:Clear and Coherent; Normal Rate ? ?Speech Volume:Normal ? ?Handedness:Right ? ? ?Mood and Affect  ?Mood:Anxious; Depressed; Hopeless; Irritable ? ?Affect:Blunt; Depressed ? ? ?Thought Process  ?Thought Processes:Coherent; Linear ? ?Descriptions of Associations:Intact ? ?Orientation:Full (Time, Place and Person) ? ?Thought Content:Logical; WDL ? ?History of Schizophrenia/Schizoaffective disorder:No ? ?Duration of Psychotic Symptoms:N/A ? ?Hallucinations:No data recorded ?Ideas of Reference:None ? ?Suicidal Thoughts:No data recorded ?Homicidal Thoughts:No data recorded ? ?Sensorium  ?Memory:Immediate Fair; Recent Fair; Remote Fair ? ?Judgment:Fair ? ?Insight:Fair ? ? ?Executive Functions  ?Concentration:Fair ? ?Attention Span:Good ? ?Recall:Good ? ?[place] ? ?Language:Good ? ? ?Psychomotor Activity  ?Psychomotor Activity:No data recorded ? ?Assets  ?Assets:Communication Skills; Physical Health; Social Support ? ? ?Sleep  ?Sleep:No data recorded ? ? ?Physical Exam: ?Physical Exam ?ROS ?Blood pressure 107/78, pulse 72, temperature 98.3 ?F (36.8 ?C), temperature source Oral, resp. rate 18, height '5\' 6"'$  (1.676 m), weight 76.2 kg, SpO2 96 %. Body mass index is 27.12 kg/m?. ? ? ?Treatment Plan Summary: ?Daily contact with patient to assess and evaluate symptoms and progress in treatment and Medication  management ? ?Patient is a 66 year old male with the above-stated past psychiatric history who is seen in follow-up.  Chart reviewed. Patient discussed with nursing. ?Patient reports partial mood improvement and de

## 2021-07-03 NOTE — Plan of Care (Signed)
D: Pt alert and oriented. Pt rates depression 7/10, hopelessness 7/10, and anxiety 7/10. Pt goal: "Be up." Pt reports energy level as low and concentration as being poor. Pt reports sleep last night as being poor. Pt did not receive medications for sleep. Pt denies experiencing any pain at this time. Pt denies/reports experiencing any SI/HI, or AVH at this time.  ? ?Pt had c/o diarrhea. Pt reported 3 episode, 1 this morning and 2 happening last night. Pt reports them to be liquid. MD notified, orders placed and executed. Pt seen this afternoon at lunch time with grape juice and was suggested by this writer to the pt that he should drink something else especially with him reporting experiencing diarrhea. Pt did choose to drink something else. ? ?Pt has found prn medication for diarrhea help and a relief.  ? ?A: Scheduled medications administered to pt, per MD orders. Support and encouragement provided. Frequent verbal contact made. Routine safety checks conducted q15 minutes.  ? ?R: No adverse drug reactions noted. Pt verbally contracts for safety at this time. Pt compliant with medications and treatment plan. Pt interacts minimally with others on the unit, mostly self isolating to room with exception of meals. Pt remains safe at this time. Will continue to monitor.  ?Problem: Activity: ?Goal: Interest or engagement in leisure activities will improve ?Outcome: Not Progressing ?  ?Problem: Coping: ?Goal: Coping ability will improve ?Description: PT IS PROGRESSING. ?Outcome: Not Progressing ?  ?

## 2021-07-04 ENCOUNTER — Encounter: Payer: Self-pay | Admitting: Psychiatry

## 2021-07-04 ENCOUNTER — Inpatient Hospital Stay: Payer: 59 | Admitting: Certified Registered"

## 2021-07-04 ENCOUNTER — Ambulatory Visit: Payer: 59

## 2021-07-04 LAB — GLUCOSE, CAPILLARY: Glucose-Capillary: 105 mg/dL — ABNORMAL HIGH (ref 70–99)

## 2021-07-04 MED ORDER — LABETALOL HCL 5 MG/ML IV SOLN
INTRAVENOUS | Status: AC
  Filled 2021-07-04: qty 4

## 2021-07-04 MED ORDER — SODIUM CHLORIDE 0.9 % IV SOLN: 500.0000 mL | Freq: Once | INTRAVENOUS | Status: AC

## 2021-07-04 MED ORDER — ONDANSETRON HCL 4 MG/2ML IJ SOLN
4.0000 mg | Freq: Once | INTRAMUSCULAR | Status: AC
  Administered 2021-07-06: 4 mg via INTRAVENOUS

## 2021-07-04 MED ORDER — KETAMINE HCL 10 MG/ML IJ SOLN
INTRAMUSCULAR | Status: DC | PRN
  Administered 2021-07-04: 100 mg via INTRAVENOUS

## 2021-07-04 MED ORDER — KETAMINE HCL 50 MG/5ML IJ SOSY
PREFILLED_SYRINGE | INTRAMUSCULAR | Status: AC
  Filled 2021-07-04: qty 5

## 2021-07-04 MED ORDER — SUCCINYLCHOLINE CHLORIDE 200 MG/10ML IV SOSY
PREFILLED_SYRINGE | INTRAVENOUS | Status: DC | PRN
  Administered 2021-07-04: 100 mg via INTRAVENOUS

## 2021-07-04 MED ORDER — SUCCINYLCHOLINE CHLORIDE 200 MG/10ML IV SOSY
PREFILLED_SYRINGE | INTRAVENOUS | Status: AC
Start: 2021-07-04 — End: ?
  Filled 2021-07-04: qty 10

## 2021-07-04 MED ORDER — LABETALOL HCL 5 MG/ML IV SOLN
INTRAVENOUS | Status: DC | PRN
Start: 2021-07-04 — End: 2021-07-04
  Administered 2021-07-04: 5 mg via INTRAVENOUS

## 2021-07-04 NOTE — Plan of Care (Signed)
?  Problem: Self-Concept: ?Goal: Will verbalize positive feelings about self ?Outcome: Progressing ?Goal: Level of anxiety will decrease ?Outcome: Progressing ?  ?Problem: Safety: ?Goal: Ability to disclose and discuss suicidal ideas will improve ?Outcome: Progressing ?Goal: Ability to identify and utilize support systems that promote safety will improve ?Outcome: Progressing ?  ?Problem: Role Relationship: ?Goal: Will demonstrate positive changes in social behaviors and relationships ?Outcome: Progressing ?  ?Problem: Health Behavior/Discharge Planning: ?Goal: Ability to make decisions will improve ?Outcome: Progressing ?Goal: Compliance with therapeutic regimen will improve ?Outcome: Progressing ?  ?Problem: Coping: ?Goal: Coping ability will improve ?Description: PT IS PROGRESSING. ?Outcome: Progressing ?Goal: Will verbalize feelings ?Outcome: Progressing ?  ?Problem: Activity: ?Goal: Interest or engagement in leisure activities will improve ?Outcome: Progressing ?Goal: Imbalance in normal sleep/wake cycle will improve ?Outcome: Progressing ?  ?

## 2021-07-04 NOTE — Anesthesia Procedure Notes (Signed)
Procedure Name: General with mask airway ?Date/Time: 07/04/2021 11:52 AM ?Performed by: Jerrye Noble, CRNA ?Pre-anesthesia Checklist: Patient identified, Emergency Drugs available, Suction available and Patient being monitored ?Patient Re-evaluated:Patient Re-evaluated prior to induction ?Oxygen Delivery Method: Circle system utilized ?Preoxygenation: Pre-oxygenation with 100% oxygen ? ? ? ? ?

## 2021-07-04 NOTE — Progress Notes (Signed)
Patient awake/alert x4. Up to bathroom with mim. Assistance, voided. ?Tolerated gingerale while in pacu. Attempted to cal x3 BH without answer. Transfer via w/c back to room 320A ?

## 2021-07-04 NOTE — Progress Notes (Signed)
Recreation Therapy Notes ? ?Date: 07/04/2021 ? ?Time: 10:40am  ? ?Location: Craft room      ? ?Behavioral response: N/A ?  ?Intervention Topic: Time Management   ? ?Discussion/Intervention: ?Patient refused to attend group.  ? ?Clinical Observations/Feedback:  ?Patient refused to attend group.  ?  ?Mackenna Kamer LRT/CTRS ? ? ? ? ? ? ? ?Trenda Corliss ?07/04/2021 12:03 PM ?

## 2021-07-04 NOTE — Group Note (Signed)
LCSW Group Therapy Note ? ?Group Date: 07/04/2021 ?Start Time: 1300 ?End Time: 1400 ? ? ?Type of Therapy and Topic:  Group Therapy - How To Cope with Nervousness about Discharge  ? ?Participation Level:  Did Not Attend  ? ?Description of Group ?This process group involved identification of patients' feelings about discharge. Some of them are scheduled to be discharged soon, while others are new admissions, but each of them was asked to share thoughts and feelings surrounding discharge from the hospital. One common theme was that they are excited at the prospect of going home, while another was that many of them are apprehensive about sharing why they were hospitalized. Patients were given the opportunity to discuss these feelings with their peers in preparation for discharge. ? ?Therapeutic Goals ? ?Patient will identify their overall feelings about pending discharge. ?Patient will think about how they might proactively address issues that they believe will once again arise once they get home (i.e. with parents). ?Patients will participate in discussion about having hope for change. ? ? ?Summary of Patient Progress:  Patient did not attend group despite encouraged participation.  ? ?Therapeutic Modalities ?Cognitive Behavioral Therapy ? ? ?Durenda Hurt, LCSWA ?07/04/2021  3:35 PM   ?

## 2021-07-04 NOTE — Progress Notes (Signed)
[facility] MD Progress Note ? ?07/04/2021 3:43 PM ?Casey Silva  ?MRN:  [id number] ?Subjective: Follow-up 66 year old man with severe depression.  Prior to treatment today patient said he was feeling better.  Mood was less depressed.  He was feeling more energetic.  After treatment he was confused for a pretty long time with a long postictal state.  Finally did come out of it and was more lucid later in the afternoon. ?Principal Problem: Severe recurrent major depression with psychotic features ([place]) ?Diagnosis: Principal Problem: ?  Severe recurrent major depression with psychotic features ([place]) ?Active Problems: ?  Primary insomnia ?  GAD (generalized anxiety disorder) ?  Essential hypertension ? ?Total Time spent with patient: 30 minutes ? ?Past Psychiatric History: History of recurrent depression ? ?Past Medical History:  ?Past Medical History:  ?Diagnosis Date  ? Anxiety   ? Asthma   ? Depression   ? History reviewed. No pertinent surgical history. ?Family History:  ?Family History  ?Problem Relation Age of Onset  ? Bipolar disorder Mother   ? ?Family Psychiatric  History: See previous ?Social History:  ?Social History  ? ?Substance and Sexual Activity  ?Alcohol Use No  ?   ?Social History  ? ?Substance and Sexual Activity  ?Drug Use No  ?  ?Social History  ? ?Socioeconomic History  ? Marital status: Single  ?  Spouse name: Not on file  ? Number of children: 1  ? Years of education: Not on file  ? Highest education level: GED or equivalent  ?Occupational History  ? Not on file  ?Tobacco Use  ? Smoking status: Never  ? Smokeless tobacco: Never  ?Vaping Use  ? Vaping Use: Unknown  ?Substance and Sexual Activity  ? Alcohol use: No  ? Drug use: No  ? Sexual activity: Not Currently  ?Other Topics Concern  ? Not on file  ?Social History Narrative  ? Marital status:  Single   Children: Daughter   Employment:  Unemployment. previous work for [employer].   Alcohol:  None   Drugs:  none   Exercise:  Regularly very    Religion:  Jewish  ? ?Social Determinants of Health  ? ?Financial Resource Strain: Not on file  ?Food Insecurity: Not on file  ?Transportation Needs: Not on file  ?Physical Activity: Not on file  ?Stress: Not on file  ?Social Connections: Not on file  ? ?Additional Social History:  ?  ?  ?  ?  ?  ?  ?  ?  ?  ?  ?  ? ?Sleep: Fair ? ?Appetite:  Fair ? ?Current Medications: ?Current Facility-Administered Medications  ?Medication Dose Route Frequency Provider Last Rate Last Admin  ? 0.9 %  sodium chloride infusion  500 mL Intravenous Once Bettina Warn T, MD      ? 0.9 %  sodium chloride infusion  500 mL Intravenous Once Aniaya Bacha, [name], MD      ? acetaminophen (TYLENOL) tablet 650 mg  650 mg Oral Once PRN Darrin Nipper, MD      ? Or  ? acetaminophen (TYLENOL) 160 MG/5ML solution 325-650 mg  325-650 mg Oral Q4H PRN Darrin Nipper, MD      ? amLODipine (NORVASC) tablet 10 mg  10 mg Oral Daily Sina Sumpter, [name], MD   10 mg at 07/04/21 0804  ? diclofenac Sodium (VOLTAREN) 1 % topical gel 2 g  2 g Topical QID Noeh Sparacino, [name], MD   2 g at 07/02/21 2149  ? feeding  supplement (ENSURE ENLIVE / ENSURE PLUS) liquid 237 mL  237 mL Oral BID BM Colbie Sliker,  T, MD   237 mL at 07/03/21 1518  ? gabapentin (NEURONTIN) capsule 400 mg  400 mg Oral TID Jalisha Enneking, [name], MD   400 mg at 07/04/21 1339  ? glycopyrrolate (ROBINUL) injection 0.1 mg  0.1 mg Intravenous Once Orissa Arreaga T, MD      ? hydrOXYzine (ATARAX) tablet 50 mg  50 mg Oral Q6H PRN Brandelyn Henne, [name], MD   50 mg at 07/02/21 1207  ? ibuprofen (ADVIL) tablet 600 mg  600 mg Oral Q6H PRN Carlen Rebuck, [name], MD   600 mg at 06/27/21 0915  ? loperamide (IMODIUM) capsule 2 mg  2 mg Oral PRN Larita Fife, MD   2 mg at 07/03/21 1323  ? melatonin tablet 5 mg  5 mg Oral QHS Ayasha Ellingsen, [name], MD   5 mg at 07/03/21 2110  ? midazolam (VERSED) injection 2 mg  2 mg Intravenous Once Dai Mcadams,  T, MD      ? mirtazapine (REMERON) tablet 30 mg  30 mg Oral QHS Daiquan Resnik,  T, MD   30 mg at 07/03/21  2110  ? multivitamin with minerals tablet 1 tablet  1 tablet Oral Daily Tammee Thielke, [name], MD   1 tablet at 07/04/21 6160  ? ondansetron (ZOFRAN) injection 4 mg  4 mg Intravenous Once PRN Darrin Nipper, MD      ? ondansetron [facility]) injection 4 mg  4 mg Intravenous Once Keyron Pokorski, [name], MD      ? ondansetron (ZOFRAN-ODT) disintegrating tablet 4 mg  4 mg Oral Q4H PRN Carolynne Schuchard, [name], MD      ? propranolol (INDERAL) tablet 10 mg  10 mg Oral TID Luceil Herrin, [name], MD   10 mg at 07/04/21 1339  ? QUEtiapine (SEROQUEL) tablet 100 mg  100 mg Oral QHS Chantella Creech, [name], MD   100 mg at 07/03/21 2110  ? QUEtiapine (SEROQUEL) tablet 25 mg  25 mg Oral BID Shalin Vonbargen, [name], MD   25 mg at 07/04/21 0804  ? traZODone (DESYREL) tablet 100 mg  100 mg Oral QHS , [name], MD   100 mg at 07/03/21 2111  ? traZODone (DESYREL) tablet 100 mg  100 mg Oral QHS PRN , [name], MD   100 mg at 06/25/21 2209  ? venlafaxine XR (EFFEXOR-XR) 24 hr capsule 112.5 mg  112.5 mg Oral Q breakfast Dynastie Knoop T, MD   112.5 mg at 07/04/21 0804  ? Vitamin D (Ergocalciferol) (DRISDOL) capsule 50,000 Units  50,000 Units Oral Q7 days , [name], MD   50,000 Units at 07/02/21 1819  ? ? ?Lab Results:  ?Results for orders placed or performed during the hospital encounter of 06/23/21 (from the past 48 hour(s))  ?Glucose, capillary     Status: Abnormal  ? Collection Time: 07/04/21  6:35 AM  ?Result Value Ref Range  ? Glucose-Capillary 105 (H) 70 - 99 mg/dL  ?  Comment: Glucose reference range applies only to samples taken after fasting for at least 8 hours.  ? Comment 1 Notify RN   ? ? ?Blood Alcohol level:  ?Lab Results  ?Component Value Date  ? ETH <10 06/07/2021  ? ETH <10 11/22/2020  ? ? ?Metabolic Disorder Labs: ?Lab Results  ?Component Value Date  ? HGBA1C 5.1 06/07/2021  ? MPG 99.67 06/07/2021  ? MPG 111.15 11/24/2020  ? ?No results found for: PROLACTIN ?Lab Results  ?  Component Value Date  ? CHOL 197 06/07/2021  ? TRIG 149 06/07/2021  ? HDL 44  06/07/2021  ? CHOLHDL 4.5 06/07/2021  ? VLDL 30 06/07/2021  ? LDLCALC 123 (H) 06/07/2021  ? LDLCALC 148 (H) 11/24/2020  ? ? ?Physical Findings: ?AIMS:  , ,  ,  ,    ?CIWA:    ?COWS:    ? ?Musculoskeletal: ?Strength & Muscle Tone: within normal limits ?Gait & Station: normal ?Patient leans: N/A ? ?Psychiatric Specialty Exam: ? ?Presentation  ?General Appearance: Appropriate for Environment; Bizarre; Disheveled; Fairly Groomed ? ?Eye Contact:Good ? ?Speech:Clear and Coherent; Normal Rate ? ?Speech Volume:Normal ? ?Handedness:Right ? ? ?Mood and Affect  ?Mood:Anxious; Depressed; Hopeless; Irritable ? ?Affect:Blunt; Depressed ? ? ?Thought Process  ?Thought Processes:Coherent; Linear ? ?Descriptions of Associations:Intact ? ?Orientation:Full (Time, Place and Person) ? ?Thought Content:Logical; WDL ? ?History of Schizophrenia/Schizoaffective disorder:No ? ?Duration of Psychotic Symptoms:N/A ? ?Hallucinations:No data recorded ?Ideas of Reference:None ? ?Suicidal Thoughts:No data recorded ?Homicidal Thoughts:No data recorded ? ?Sensorium  ?Memory:Immediate Fair; Recent Fair; Remote Fair ? ?Judgment:Fair ? ?Insight:Fair ? ? ?Executive Functions  ?Concentration:Fair ? ?Attention Span:Good ? ?Recall:Good ? ?[place] ? ?Language:Good ? ? ?Psychomotor Activity  ?Psychomotor Activity:No data recorded ? ?Assets  ?Assets:Communication Skills; Physical Health; Social Support ? ? ?Sleep  ?Sleep:No data recorded ? ? ?Physical Exam: ?Physical Exam ?Vitals and nursing note reviewed.  ?Constitutional:   ?   Appearance: Normal appearance.  ?HENT:  ?   Head: Normocephalic and atraumatic.  ?   Mouth/Throat:  ?   Pharynx: Oropharynx is clear.  ?Eyes:  ?   Pupils: Pupils are equal, round, and reactive to light.  ?Cardiovascular:  ?   Rate and Rhythm: Normal rate and regular rhythm.  ?Pulmonary:  ?   Effort: Pulmonary effort is normal.  ?   Breath sounds: Normal breath sounds.  ?Abdominal:  ?   General: Abdomen is flat.  ?    Palpations: Abdomen is soft.  ?Musculoskeletal:     ?   General: Normal range of motion.  ?Skin: ?   General: Skin is warm and dry.  ?Neurological:  ?   General: No focal deficit present.  ?   Mental Status: He

## 2021-07-04 NOTE — Progress Notes (Signed)
Patient denies pain. Patient rates anxiety and depression 7/10. Patient endorsed SI early this morning but denies SI/HI/AVH at time of assessment. Patient contracts to safety. Patient tolerated ECT well. Patient compliant with medication administration. Patient isolative to room after returning from ECT.  ?Q15 minute safety checks maintained. Patient remains safe on the unit at this time. ?

## 2021-07-04 NOTE — H&P (Signed)
Casey Silva is an 66 y.o. male.   ?Chief Complaint: no new complaint ?HPI: energy better ? ?Past Medical History:  ?Diagnosis Date  ? Anxiety   ? Asthma   ? Depression   ? ? ?History reviewed. No pertinent surgical history. ? ?Family History  ?Problem Relation Age of Onset  ? Bipolar disorder Mother   ? ?Social History:  reports that he has never smoked. He has never used smokeless tobacco. He reports that he does not drink alcohol and does not use drugs. ? ?Allergies:  ?Allergies  ?Allergen Reactions  ? Dust Mite Mixed Allergen Ext [Mite (D. Farinae)] Shortness Of Breath  ? Bee Pollen Other (See Comments)  ?  Watery Eyes  ? ? ?Medications Prior to Admission  ?Medication Sig Dispense Refill  ? amLODipine (NORVASC) 10 MG tablet Take 1 tablet (10 mg total) by mouth daily. 30 tablet 0  ? gabapentin (NEURONTIN) 400 MG capsule Take 1 capsule (400 mg total) by mouth 3 (three) times daily. 90 capsule 0  ? LORazepam (ATIVAN) 0.5 MG tablet Take 1 tablet (0.5 mg total) by mouth 2 (two) times daily as needed for anxiety. 30 tablet 0  ? melatonin 5 MG TABS Take 1 tablet (5 mg total) by mouth at bedtime. 30 tablet 0  ? mirtazapine (REMERON) 30 MG tablet Take 1 tablet (30 mg total) by mouth at bedtime. 30 tablet 3  ? propranolol (INDERAL) 10 MG tablet Take 1 tablet (10 mg total) by mouth 3 (three) times daily. 90 tablet 0  ? QUEtiapine (SEROQUEL) 100 MG tablet Take 1 tablet (100 mg total) by mouth at bedtime. 30 tablet 0  ? QUEtiapine (SEROQUEL) 25 MG tablet Take 1 tablet (25 mg total) by mouth 2 (two) times daily. 60 tablet 0  ? traZODone (DESYREL) 100 MG tablet Take 1 tablet (100 mg total) by mouth at bedtime. 30 tablet 0  ? venlafaxine XR (EFFEXOR-XR) 37.5 MG 24 hr capsule Take 3 capsules (112.5 mg total) by mouth daily with breakfast. 30 capsule 0  ? Vitamin D, Ergocalciferol, (DRISDOL) 1.25 MG (50000 UNIT) CAPS capsule Take 1 capsule (50,000 Units total) by mouth every 7 (seven) days. 5 capsule 0  ? ? ?Results for orders  placed or performed during the hospital encounter of 06/23/21 (from the past 48 hour(s))  ?Glucose, capillary     Status: Abnormal  ? Collection Time: 07/04/21  6:35 AM  ?Result Value Ref Range  ? Glucose-Capillary 105 (H) 70 - 99 mg/dL  ?  Comment: Glucose reference range applies only to samples taken after fasting for at least 8 hours.  ? Comment 1 Notify RN   ? ?No results found. ? ?Review of Systems  ?Constitutional: Negative.   ?HENT: Negative.    ?Eyes: Negative.   ?Respiratory: Negative.    ?Cardiovascular: Negative.   ?Gastrointestinal: Negative.   ?Musculoskeletal: Negative.   ?Skin: Negative.   ?Neurological: Negative.   ?Psychiatric/Behavioral: Negative.    ? ?Blood pressure 112/83, pulse 82, temperature 98.3 ?F (36.8 ?C), temperature source Oral, resp. rate 18, height '5\' 6"'$  (1.676 m), weight 76.2 kg, SpO2 96 %. ?Physical Exam ?Vitals and nursing note reviewed.  ?Constitutional:   ?   Appearance: He is well-developed.  ?HENT:  ?   Head: Normocephalic and atraumatic.  ?Eyes:  ?   Conjunctiva/sclera: Conjunctivae normal.  ?   Pupils: Pupils are equal, round, and reactive to light.  ?Cardiovascular:  ?   Heart sounds: Normal heart sounds.  ?Pulmonary:  ?  Effort: Pulmonary effort is normal.  ?Abdominal:  ?   Palpations: Abdomen is soft.  ?Musculoskeletal:     ?   General: Normal range of motion.  ?   Cervical back: Normal range of motion.  ?Skin: ?   General: Skin is warm and dry.  ?Neurological:  ?   General: No focal deficit present.  ?   Mental Status: He is alert.  ?Psychiatric:     ?   Mood and Affect: Mood normal.  ?  ? ?Assessment/Plan ?Continue RUL ECT ? ?Alethia Berthold, MD ?07/04/2021, 10:11 AM ? ? ? ?

## 2021-07-04 NOTE — Transfer of Care (Signed)
Immediate Anesthesia Transfer of Care Note ? ?Patient: Casey Silva ? ?Procedure(s) Performed: ECT TX ? ?Patient Location: PACU ? ?Anesthesia Type:General ? ?Level of Consciousness: drowsy and patient cooperative ? ?Airway & Oxygen Therapy: Patient Spontanous Breathing and Patient connected to face mask oxygen ? ?Post-op Assessment: Report given to RN and Post -op Vital signs reviewed and stable ? ?Post vital signs: Reviewed and stable ? ?Last Vitals:  ?Vitals Value Taken Time  ?BP 142/109 07/04/21 1211  ?Temp    ?Pulse 89 07/04/21 1215  ?Resp 20 07/04/21 1215  ?SpO2 94 % 07/04/21 1215  ?Vitals shown include unvalidated device data. ? ?Last Pain:  ?Vitals:  ? 07/04/21 1027  ?TempSrc:   ?PainSc: 0-No pain  ?   ? ?Patients Stated Pain Goal:  (no) (06/27/21 0915) ? ?Complications: No notable events documented. ?

## 2021-07-04 NOTE — Plan of Care (Signed)
?  Problem: Education: ?Goal: Knowledge of the prescribed therapeutic regimen will improve ?Outcome: Progressing ?  ?Problem: Coping: ?Goal: Will verbalize feelings ?Outcome: Progressing ?  ?Problem: Health Behavior/Discharge Planning: ?Goal: Compliance with therapeutic regimen will improve ?Outcome: Progressing ?  ?Problem: Safety: ?Goal: Ability to disclose and discuss suicidal ideas will improve ?Outcome: Progressing ?  ?Problem: Self-Concept: ?Goal: Level of anxiety will decrease ?Outcome: Not Progressing ?  ?

## 2021-07-04 NOTE — Anesthesia Preprocedure Evaluation (Signed)
Anesthesia Evaluation  ?Patient identified by MRN, date of birth, ID band ?Patient awake ? ? ? ?Reviewed: ?Allergy & Precautions, NPO status , Patient's Chart, lab work & pertinent test results ? ?History of Anesthesia Complications ?Negative for: history of anesthetic complications ? ?Airway ?Mallampati: III ? ? ?Neck ROM: Full ? ? ? Dental ? ? ?Missing all upper teeth except 3:   ?Pulmonary ?asthma , COPD,  ?  ?Pulmonary exam normal ?breath sounds clear to auscultation ? ? ? ? ? ? Cardiovascular ?hypertension, Normal cardiovascular exam ?Rhythm:Regular Rate:Normal ? ?ECG 06/23/21: normal ?  ?Neuro/Psych ?PSYCHIATRIC DISORDERS Anxiety Depression negative neurological ROS ?   ? GI/Hepatic ?negative GI ROS,   ?Endo/Other  ?negative endocrine ROS ? Renal/GU ?negative Renal ROS  ? ?  ?Musculoskeletal ? ? Abdominal ?  ?Peds ? Hematology ?negative hematology ROS ?(+)   ?Anesthesia Other Findings ? ? Reproductive/Obstetrics ? ?  ? ? ? ? ? ? ? ? ? ? ? ? ? ?  ?  ? ? ? ? ? ? ? ? ?Anesthesia Physical ? ?Anesthesia Plan ? ?ASA: 3 ? ?Anesthesia Plan: General  ? ?Post-op Pain Management:   ? ?Induction: Intravenous ? ?PONV Risk Score and Plan: 2 and TIVA and Treatment may vary due to age or medical condition ? ?Airway Management Planned: Natural Airway ? ?Additional Equipment:  ? ?Intra-op Plan:  ? ?Post-operative Plan:  ? ?Informed Consent: I have reviewed the patients History and Physical, chart, labs and discussed the procedure including the risks, benefits and alternatives for the proposed anesthesia with the patient or authorized representative who has indicated his/her understanding and acceptance.  ? ? ? ? ? ?Plan Discussed with: CRNA ? ?Anesthesia Plan Comments: (LMA/GETA backup discussed.  Serial consent on chart.  Patient consented for risks of anesthesia including but not limited to:  ?- adverse reactions to medications ?- damage to eyes, teeth, lips or other oral mucosa ?- nerve  damage due to positioning  ?- sore throat or hoarseness ?- damage to heart, brain, nerves, lungs, other parts of body or loss of life ? ?Informed patient about role of CRNA in peri- and intra-operative care.  Patient voiced understanding.)  ? ? ? ? ? ? ?Anesthesia Quick Evaluation ? ?

## 2021-07-04 NOTE — BH IP Treatment Plan (Signed)
Interdisciplinary Treatment and Diagnostic Plan Update ? ?07/04/2021 ?Time of Session: 1856 ?[name] ?MRN: [id number] ? ?Principal Diagnosis: Severe recurrent major depression with psychotic features ([place]) ? ?Secondary Diagnoses: Principal Problem: ?  Severe recurrent major depression with psychotic features ([place]) ?Active Problems: ?  Primary insomnia ?  GAD (generalized anxiety disorder) ?  Essential hypertension ? ? ?Current Medications:  ?Current Facility-Administered Medications  ?Medication Dose Route Frequency Provider Last Rate Last Admin  ? 0.9 %  sodium chloride infusion  500 mL Intravenous Once Clapacs, [name], MD      ? acetaminophen (TYLENOL) tablet 650 mg  650 mg Oral Once PRN Darrin Nipper, MD      ? Or  ? acetaminophen (TYLENOL) 160 MG/5ML solution 325-650 mg  325-650 mg Oral Q4H PRN Darrin Nipper, MD      ? amLODipine (NORVASC) tablet 10 mg  10 mg Oral Daily Clapacs, [name], MD   10 mg at 07/04/21 0804  ? diclofenac Sodium (VOLTAREN) 1 % topical gel 2 g  2 g Topical QID Clapacs, [name], MD   2 g at 07/02/21 2149  ? feeding supplement (ENSURE ENLIVE / ENSURE PLUS) liquid 237 mL  237 mL Oral BID BM Clapacs, [name], MD   237 mL at 07/03/21 1518  ? gabapentin (NEURONTIN) capsule 400 mg  400 mg Oral TID Clapacs, [name], MD   400 mg at 07/04/21 0804  ? glycopyrrolate (ROBINUL) injection 0.1 mg  0.1 mg Intravenous Once Clapacs, [name], MD      ? hydrOXYzine (ATARAX) tablet 50 mg  50 mg Oral Q6H PRN Clapacs, [name], MD   50 mg at 07/02/21 1207  ? ibuprofen (ADVIL) tablet 600 mg  600 mg Oral Q6H PRN Clapacs, [name], MD   600 mg at 06/27/21 0915  ? loperamide (IMODIUM) capsule 2 mg  2 mg Oral PRN Larita Fife, MD   2 mg at 07/03/21 1323  ? melatonin tablet 5 mg  5 mg Oral QHS Clapacs, [name], MD   5 mg at 07/03/21 2110  ? midazolam (VERSED) injection 2 mg  2 mg Intravenous Once Clapacs, [name], MD      ? mirtazapine (REMERON) tablet 30 mg  30 mg Oral QHS Clapacs, [name], MD   30 mg at 07/03/21 2110  ?  multivitamin with minerals tablet 1 tablet  1 tablet Oral Daily Clapacs, [name], MD   1 tablet at 07/04/21 7858  ? ondansetron (ZOFRAN) injection 4 mg  4 mg Intravenous Once PRN Darrin Nipper, MD      ? ondansetron (ZOFRAN-ODT) disintegrating tablet 4 mg  4 mg Oral Q4H PRN Clapacs, [name], MD      ? propranolol (INDERAL) tablet 10 mg  10 mg Oral TID Clapacs, [name], MD   10 mg at 07/04/21 0804  ? QUEtiapine (SEROQUEL) tablet 100 mg  100 mg Oral QHS Clapacs, [name], MD   100 mg at 07/03/21 2110  ? QUEtiapine (SEROQUEL) tablet 25 mg  25 mg Oral BID Clapacs, [name], MD   25 mg at 07/04/21 0804  ? traZODone (DESYREL) tablet 100 mg  100 mg Oral QHS Clapacs, [name], MD   100 mg at 07/03/21 2111  ? traZODone (DESYREL) tablet 100 mg  100 mg Oral QHS PRN Clapacs, [name], MD   100 mg at 06/25/21 2209  ? venlafaxine XR (EFFEXOR-XR) 24 hr capsule 112.5 mg  112.5 mg Oral Q breakfast Clapacs, John  T, MD   112.5 mg at 07/04/21 0804  ? Vitamin D (Ergocalciferol) (DRISDOL) capsule 50,000 Units  50,000 Units Oral Q7 days Clapacs, [name], MD   50,000 Units at 07/02/21 1819  ? ?PTA Medications: ?Medications Prior to Admission  ?Medication Sig Dispense Refill Last Dose  ? amLODipine (NORVASC) 10 MG tablet Take 1 tablet (10 mg total) by mouth daily. 30 tablet 0   ? gabapentin (NEURONTIN) 400 MG capsule Take 1 capsule (400 mg total) by mouth 3 (three) times daily. 90 capsule 0   ? LORazepam (ATIVAN) 0.5 MG tablet Take 1 tablet (0.5 mg total) by mouth 2 (two) times daily as needed for anxiety. 30 tablet 0   ? melatonin 5 MG TABS Take 1 tablet (5 mg total) by mouth at bedtime. 30 tablet 0   ? mirtazapine (REMERON) 30 MG tablet Take 1 tablet (30 mg total) by mouth at bedtime. 30 tablet 3   ? propranolol (INDERAL) 10 MG tablet Take 1 tablet (10 mg total) by mouth 3 (three) times daily. 90 tablet 0   ? QUEtiapine (SEROQUEL) 100 MG tablet Take 1 tablet (100 mg total) by mouth at bedtime. 30 tablet 0   ? QUEtiapine (SEROQUEL) 25 MG tablet Take  1 tablet (25 mg total) by mouth 2 (two) times daily. 60 tablet 0   ? traZODone (DESYREL) 100 MG tablet Take 1 tablet (100 mg total) by mouth at bedtime. 30 tablet 0   ? venlafaxine XR (EFFEXOR-XR) 37.5 MG 24 hr capsule Take 3 capsules (112.5 mg total) by mouth daily with breakfast. 30 capsule 0   ? Vitamin D, Ergocalciferol, (DRISDOL) 1.25 MG (50000 UNIT) CAPS capsule Take 1 capsule (50,000 Units total) by mouth every 7 (seven) days. 5 capsule 0   ? ? ?Patient Stressors: Loss of connection with work, loss of sense of purpose   ?Other: loss of mental health   ? ?Patient Strengths: Ability for insight  ?Communication skills  ?General fund of knowledge  ?Motivation for treatment/growth  ? ?Treatment Modalities: Medication Management, Group therapy, Case management,  ?1 to 1 session with clinician, Psychoeducation, Recreational therapy. ? ? ?Physician Treatment Plan for Primary Diagnosis: Severe recurrent major depression with psychotic features ([place]) ?Long Term Goal(s): Improvement in symptoms so as ready for discharge  ? ?Short Term Goals: Ability to maintain clinical measurements within normal limits will improve ?Compliance with prescribed medications will improve ?Ability to verbalize feelings will improve ?Ability to disclose and discuss suicidal ideas ?Ability to demonstrate self-control will improve ? ?Medication Management: Evaluate patient's response, side effects, and tolerance of medication regimen. ? ?Therapeutic Interventions: 1 to 1 sessions, Unit Group sessions and Medication administration. ? ?Evaluation of Outcomes: Progressing ? ?Physician Treatment Plan for Secondary Diagnosis: Principal Problem: ?  Severe recurrent major depression with psychotic features ([place]) ?Active Problems: ?  Primary insomnia ?  GAD (generalized anxiety disorder) ?  Essential hypertension ? ?Long Term Goal(s): Improvement in symptoms so as ready for discharge  ? ?Short Term Goals: Ability to maintain clinical measurements  within normal limits will improve ?Compliance with prescribed medications will improve ?Ability to verbalize feelings will improve ?Ability to disclose and discuss suicidal ideas ?Ability to demonstrate self-control will improve    ? ?Medication Management: Evaluate patient's response, side effects, and tolerance of medication regimen. ? ?Therapeutic Interventions: 1 to 1 sessions, Unit Group sessions and Medication administration. ? ?Evaluation of Outcomes: Progressing ? ? ?RN Treatment Plan for Primary Diagnosis: Severe recurrent major depression with psychotic features (  [place]) ?Long Term Goal(s): Knowledge of disease and therapeutic regimen to maintain health will improve ? ?Short Term Goals: Ability to remain free from injury will improve, Ability to verbalize frustration and anger appropriately will improve, Ability to demonstrate self-control, Ability to participate in decision making will improve, Ability to verbalize feelings will improve, Ability to disclose and discuss suicidal ideas, Ability to identify and develop effective coping behaviors will improve, and Compliance with prescribed medications will improve ? ?Medication Management: RN will administer medications as ordered by provider, will assess and evaluate patient's response and provide education to patient for prescribed medication. RN will report any adverse and/or side effects to prescribing provider. ? ?Therapeutic Interventions: 1 on 1 counseling sessions, Psychoeducation, Medication administration, Evaluate responses to treatment, Monitor vital signs and CBGs as ordered, Perform/monitor CIWA, COWS, AIMS and Fall Risk screenings as ordered, Perform wound care treatments as ordered. ? ?Evaluation of Outcomes: Progressing ? ? ?LCSW Treatment Plan for Primary Diagnosis: Severe recurrent major depression with psychotic features ([place]) ?Long Term Goal(s): Safe transition to appropriate next level of care at discharge, Engage patient in therapeutic  group addressing interpersonal concerns. ? ?Short Term Goals: Engage patient in aftercare planning with referrals and resources, Increase social support, Increase ability to appropriately verbalize feelings,

## 2021-07-04 NOTE — Procedures (Signed)
ECT SERVICES ?Physician?s Interval Evaluation & Treatment Note ? ?Patient Identification: Teaghan Formica ?MRN:  [id number] ?Date of Evaluation:  07/04/2021 ?TX #: 4 ? ?MADRS:  ? ?MMSE:  ? ?P.E. Findings: ? ?No change to physical exam ? ?Psychiatric Interval Note: ? ?Mood is reported as being a little bit better ? ?Subjective: ? ?Patient is a 66 y.o. male seen for evaluation for Electroconvulsive Therapy. ?Less fatigued less negative ? ?Treatment Summary: ? ? '[]'$   Right Unilateral             '[x]'$  Bilateral ?  ?% Energy : 1.0 ms 100% ? ? Impedance: 1510 ohms ? ?Seizure Energy Index: 2790 ?V squared ? ?Postictal Suppression Index: 42% ? ?Seizure Concordance Index: 95% ? ?Medications ? ?Pre Shock: Ketamine 100 mg succinylcholine 100 mg ? ?Post Shock:   ? ?Seizure Duration: 22 seconds EMG 38 seconds EEG ? ? ?Comments: ?Next treatment Wednesday ? ?Lungs: ? ?'[x]'$   Clear to auscultation              ? ?'[]'$  Other:  ? ?Heart: ?  ? '[x]'$   Regular rhythm             '[]'$  irregular rhythm ? ? ? '[x]'$   Previous H&P reviewed, patient examined and there are NO CHANGES              ?  ? '[]'$   Previous H&P reviewed, patient examined and there are changes noted. ? ? ?Alethia Berthold, MD ?3/27/20234:01 PM ? ?  ?

## 2021-07-05 ENCOUNTER — Other Ambulatory Visit: Payer: Self-pay | Admitting: Psychiatry

## 2021-07-05 MED ORDER — VENLAFAXINE HCL ER 75 MG PO CP24
150.0000 mg | ORAL_CAPSULE | Freq: Every day | ORAL | Status: DC
  Administered 2021-07-06 – 2021-07-25 (×20): 150 mg via ORAL
  Filled 2021-07-05 (×20): qty 2

## 2021-07-05 MED ORDER — MIRTAZAPINE 15 MG PO TABS
45.0000 mg | ORAL_TABLET | Freq: Every day | ORAL | Status: DC
  Administered 2021-07-05 – 2021-08-02 (×29): 45 mg via ORAL
  Filled 2021-07-05 (×28): qty 3

## 2021-07-05 NOTE — Progress Notes (Signed)
Patient denies pain. Patient rates anxiety and depression 6/10. Patient denies current SI/HI/AVH. Patient contracts to safety. Patient compliant with medication administration.  ?Patient isolative to room during shift with the exception to coming out for meals.  ?Q15 minute safety checks maintained. Patient remains safe on the unit at this time. ?

## 2021-07-05 NOTE — Group Note (Cosign Needed)
Ken Caryl LCSW Group Therapy Note ? ? ?Group Date: 07/05/2021 ?Start Time: 1300 ?End Time: 1400 ? ? ?Type of Therapy/Topic:  Group Therapy:  Balance in Life ? ?Participation Level:  Did Not Attend  ? ?Description of Group:   ? This group will address the concept of balance and how it feels and looks when one is unbalanced. Patients will be encouraged to process areas in their lives that are out of balance, and identify reasons for remaining unbalanced. Facilitators will guide patients utilizing problem- solving interventions to address and correct the stressor making their life unbalanced. Understanding and applying boundaries will be explored and addressed for obtaining  and maintaining a balanced life. Patients will be encouraged to explore ways to assertively make their unbalanced needs known to significant others in their lives, using other group members and facilitator for support and feedback. ? ?Therapeutic Goals: ?Patient will identify two or more emotions or situations they have that consume much of in their lives. ?Patient will identify signs/triggers that life has become out of balance:  ?Patient will identify two ways to set boundaries in order to achieve balance in their lives:  ?Patient will demonstrate ability to communicate their needs through discussion and/or role plays ? ?Summary of Patient Progress: ? ? ? ?X ? ? ? ?Therapeutic Modalities:   ?Cognitive Behavioral Therapy ?Solution-Focused Therapy ?Assertiveness Training ? ? ?Karalee Height, Student-Social Work ?

## 2021-07-05 NOTE — Anesthesia Postprocedure Evaluation (Signed)
Anesthesia Post Note ? ?Patient: Casey Silva ? ?Procedure(s) Performed: ECT TX ? ?Patient location during evaluation: PACU ?Anesthesia Type: General ?Level of consciousness: awake and alert ?Pain management: pain level controlled ?Vital Signs Assessment: post-procedure vital signs reviewed and stable ?Respiratory status: spontaneous breathing, nonlabored ventilation, respiratory function stable and patient connected to nasal cannula oxygen ?Cardiovascular status: blood pressure returned to baseline and stable ?Postop Assessment: no apparent nausea or vomiting ?Anesthetic complications: no ? ? ?No notable events documented. ? ? ?Last Vitals:  ?Vitals:  ? 07/05/21 0626 07/05/21 1421  ?BP: 105/79 99/72  ?Pulse:  76  ?Resp: 17   ?Temp: 36.9 ?C   ?SpO2:    ?  ?Last Pain:  ?Vitals:  ? 07/05/21 0928  ?TempSrc:   ?PainSc: 0-No pain  ? ? ?  ?  ?  ?  ?  ?  ? ?Martha Clan ? ? ? ? ?

## 2021-07-05 NOTE — Progress Notes (Signed)
Patient is better. More social on the unit with both staff and his peers. Reports ECT is working for him and he has no residual effects from his treatment earlier in  the day.  Continues to endorse anxiety and depression, buts states he is starting to feel better, with less depression and anxiety. He is med compliant and received his meds without incident.  Will continue to monitor with q 15 minute safety checks. Encouraged patient to come to staff with any concerns. ? ? ? ? ?C Butler-Nicholson, LPN ?

## 2021-07-05 NOTE — Progress Notes (Signed)
[facility] MD Progress Note ? ?07/05/2021 3:45 PM ?Casey Silva  ?MRN:  [id number] ?Subjective: Follow-up for this patient getting ECT.  He is very focused on how distressed he is by his short-term memory impairment.  He cannot remember coming here to [facility].  His affect and mood seem significantly better but when I ask him about suicidal thoughts he continues to endorse having them at times.  Still disheveled and not very active ?Principal Problem: Severe recurrent major depression with psychotic features ([place]) ?Diagnosis: Principal Problem: ?  Severe recurrent major depression with psychotic features ([place]) ?Active Problems: ?  Primary insomnia ?  GAD (generalized anxiety disorder) ?  Essential hypertension ? ?Total Time spent with patient: 30 minutes ? ?Past Psychiatric History: History of recurrent depression ? ?Past Medical History:  ?Past Medical History:  ?Diagnosis Date  ? Anxiety   ? Asthma   ? Depression   ? History reviewed. No pertinent surgical history. ?Family History:  ?Family History  ?Problem Relation Age of Onset  ? Bipolar disorder Mother   ? ?Family Psychiatric  History: See previous ?Social History:  ?Social History  ? ?Substance and Sexual Activity  ?Alcohol Use No  ?   ?Social History  ? ?Substance and Sexual Activity  ?Drug Use No  ?  ?Social History  ? ?Socioeconomic History  ? Marital status: Single  ?  Spouse name: Not on file  ? Number of children: 1  ? Years of education: Not on file  ? Highest education level: GED or equivalent  ?Occupational History  ? Not on file  ?Tobacco Use  ? Smoking status: Never  ? Smokeless tobacco: Never  ?Vaping Use  ? Vaping Use: Unknown  ?Substance and Sexual Activity  ? Alcohol use: No  ? Drug use: No  ? Sexual activity: Not Currently  ?Other Topics Concern  ? Not on file  ?Social History Narrative  ? Marital status:  Single   Children: Daughter   Employment:  Unemployment. previous work for [employer].   Alcohol:  None   Drugs:  none   Exercise:   Regularly very   Religion:  Jewish  ? ?Social Determinants of Health  ? ?Financial Resource Strain: Not on file  ?Food Insecurity: Not on file  ?Transportation Needs: Not on file  ?Physical Activity: Not on file  ?Stress: Not on file  ?Social Connections: Not on file  ? ?Additional Social History:  ?  ?  ?  ?  ?  ?  ?  ?  ?  ?  ?  ? ?Sleep: Fair ? ?Appetite:  Fair ? ?Current Medications: ?Current Facility-Administered Medications  ?Medication Dose Route Frequency Provider Last Rate Last Admin  ? 0.9 %  sodium chloride infusion  500 mL Intravenous Once Lugenia Assefa T, MD      ? 0.9 %  sodium chloride infusion  500 mL Intravenous Once Trinika Cortese, [name], MD      ? acetaminophen (TYLENOL) tablet 650 mg  650 mg Oral Once PRN Darrin Nipper, MD      ? Or  ? acetaminophen (TYLENOL) 160 MG/5ML solution 325-650 mg  325-650 mg Oral Q4H PRN Darrin Nipper, MD      ? amLODipine (NORVASC) tablet 10 mg  10 mg Oral Daily Laythan Hayter, [name], MD   10 mg at 07/05/21 0820  ? diclofenac Sodium (VOLTAREN) 1 % topical gel 2 g  2 g Topical QID Donyell Carrell, [name], MD   2 g at 07/02/21 2149  ?  feeding supplement (ENSURE ENLIVE / ENSURE PLUS) liquid 237 mL  237 mL Oral BID BM Mani Celestin,  T, MD   237 mL at 07/05/21 1018  ? gabapentin (NEURONTIN) capsule 400 mg  400 mg Oral TID Kenney Going, [name], MD   400 mg at 07/05/21 1214  ? glycopyrrolate (ROBINUL) injection 0.1 mg  0.1 mg Intravenous Once Solace Manwarren T, MD      ? hydrOXYzine (ATARAX) tablet 50 mg  50 mg Oral Q6H PRN Mathhew Buysse, [name], MD   50 mg at 07/04/21 2111  ? ibuprofen (ADVIL) tablet 600 mg  600 mg Oral Q6H PRN Laelani Vasko, [name], MD   600 mg at 06/27/21 0915  ? loperamide (IMODIUM) capsule 2 mg  2 mg Oral PRN Larita Fife, MD   2 mg at 07/03/21 1323  ? melatonin tablet 5 mg  5 mg Oral QHS Shadana Pry, [name], MD   5 mg at 07/04/21 2111  ? midazolam (VERSED) injection 2 mg  2 mg Intravenous Once Erionna Strum T, MD      ? mirtazapine (REMERON) tablet 30 mg  30 mg Oral QHS Keelon Zurn,  T, MD    30 mg at 07/04/21 2111  ? multivitamin with minerals tablet 1 tablet  1 tablet Oral Daily Kazi Montoro, [name], MD   1 tablet at 07/05/21 0820  ? ondansetron (ZOFRAN) injection 4 mg  4 mg Intravenous Once PRN Darrin Nipper, MD      ? ondansetron [facility] Rhonda W) injection 4 mg  4 mg Intravenous Once Twanisha Foulk, [name], MD      ? ondansetron (ZOFRAN-ODT) disintegrating tablet 4 mg  4 mg Oral Q4H PRN Kailia Starry, [name], MD      ? propranolol (INDERAL) tablet 10 mg  10 mg Oral TID Deaja Rizo, [name], MD   10 mg at 07/05/21 0820  ? QUEtiapine (SEROQUEL) tablet 100 mg  100 mg Oral QHS Kadi Hession, [name], MD   100 mg at 07/04/21 2111  ? QUEtiapine (SEROQUEL) tablet 25 mg  25 mg Oral BID Leonard Hendler, [name], MD   25 mg at 07/05/21 0820  ? traZODone (DESYREL) tablet 100 mg  100 mg Oral QHS Natividad Schlosser T, MD   100 mg at 07/04/21 2112  ? traZODone (DESYREL) tablet 100 mg  100 mg Oral QHS PRN , [name], MD   100 mg at 06/25/21 2209  ? venlafaxine XR (EFFEXOR-XR) 24 hr capsule 112.5 mg  112.5 mg Oral Q breakfast Ceferino Lang T, MD   112.5 mg at 07/05/21 0820  ? Vitamin D (Ergocalciferol) (DRISDOL) capsule 50,000 Units  50,000 Units Oral Q7 days , [name], MD   50,000 Units at 07/02/21 1819  ? ? ?Lab Results:  ?Results for orders placed or performed during the hospital encounter of 06/23/21 (from the past 48 hour(s))  ?Glucose, capillary     Status: Abnormal  ? Collection Time: 07/04/21  6:35 AM  ?Result Value Ref Range  ? Glucose-Capillary 105 (H) 70 - 99 mg/dL  ?  Comment: Glucose reference range applies only to samples taken after fasting for at least 8 hours.  ? Comment 1 Notify RN   ? ? ?Blood Alcohol level:  ?Lab Results  ?Component Value Date  ? ETH <10 06/07/2021  ? ETH <10 11/22/2020  ? ? ?Metabolic Disorder Labs: ?Lab Results  ?Component Value Date  ? HGBA1C 5.1 06/07/2021  ? MPG 99.67 06/07/2021  ? MPG 111.15 11/24/2020  ? ?No results found for: PROLACTIN ?Lab Results  ?  Component Value Date  ? CHOL 197 06/07/2021  ? TRIG 149  06/07/2021  ? HDL 44 06/07/2021  ? CHOLHDL 4.5 06/07/2021  ? VLDL 30 06/07/2021  ? LDLCALC 123 (H) 06/07/2021  ? LDLCALC 148 (H) 11/24/2020  ? ? ?Physical Findings: ?AIMS:  , ,  ,  ,    ?CIWA:    ?COWS:    ? ?Musculoskeletal: ?Strength & Muscle Tone: within normal limits ?Gait & Station: normal ?Patient leans: N/A ? ?Psychiatric Specialty Exam: ? ?Presentation  ?General Appearance: Appropriate for Environment; Bizarre; Disheveled; Fairly Groomed ? ?Eye Contact:Good ? ?Speech:Clear and Coherent; Normal Rate ? ?Speech Volume:Normal ? ?Handedness:Right ? ? ?Mood and Affect  ?Mood:Anxious; Depressed; Hopeless; Irritable ? ?Affect:Blunt; Depressed ? ? ?Thought Process  ?Thought Processes:Coherent; Linear ? ?Descriptions of Associations:Intact ? ?Orientation:Full (Time, Place and Person) ? ?Thought Content:Logical; WDL ? ?History of Schizophrenia/Schizoaffective disorder:No ? ?Duration of Psychotic Symptoms:N/A ? ?Hallucinations:No data recorded ?Ideas of Reference:None ? ?Suicidal Thoughts:No data recorded ?Homicidal Thoughts:No data recorded ? ?Sensorium  ?Memory:Immediate Fair; Recent Fair; Remote Fair ? ?Judgment:Fair ? ?Insight:Fair ? ? ?Executive Functions  ?Concentration:Fair ? ?Attention Span:Good ? ?Recall:Good ? ?[place] ? ?Language:Good ? ? ?Psychomotor Activity  ?Psychomotor Activity:No data recorded ? ?Assets  ?Assets:Communication Skills; Physical Health; Social Support ? ? ?Sleep  ?Sleep:No data recorded ? ? ?Physical Exam: ?Physical Exam ?Vitals and nursing note reviewed.  ?Constitutional:   ?   Appearance: Normal appearance.  ?HENT:  ?   Head: Normocephalic and atraumatic.  ?   Mouth/Throat:  ?   Pharynx: Oropharynx is clear.  ?Eyes:  ?   Pupils: Pupils are equal, round, and reactive to light.  ?Cardiovascular:  ?   Rate and Rhythm: Normal rate and regular rhythm.  ?Pulmonary:  ?   Effort: Pulmonary effort is normal.  ?   Breath sounds: Normal breath sounds.  ?Abdominal:  ?   General:  Abdomen is flat.  ?   Palpations: Abdomen is soft.  ?Musculoskeletal:     ?   General: Normal range of motion.  ?Skin: ?   General: Skin is warm and dry.  ?Neurological:  ?   General: No focal deficit present.

## 2021-07-05 NOTE — Progress Notes (Signed)
Recreation Therapy Notes ? ?Date: 07/05/2021 ? ?Time: 10:35am  ? ?Location: Courtyard   ? ?Behavioral response: N/A ?  ?Intervention Topic: Wellness  ? ?Discussion/Intervention: ?Patient refused to attend group.  ? ?Clinical Observations/Feedback:  ?Patient refused to attend group.  ?  ?Betti Goodenow LRT/CTRS ? ? ? ? ? ? ? ?Sharica Roedel ?07/05/2021 12:21 PM ?

## 2021-07-05 NOTE — Plan of Care (Signed)
?  Problem: Education: ?Goal: Utilization of techniques to improve thought processes will improve ?Outcome: Progressing ?Goal: Knowledge of the prescribed therapeutic regimen will improve ?Outcome: Progressing ?  ?Problem: Activity: ?Goal: Interest or engagement in leisure activities will improve ?Outcome: Progressing ?Goal: Imbalance in normal sleep/wake cycle will improve ?Outcome: Progressing ?  ?Problem: Coping: ?Goal: Coping ability will improve ?Description: PT IS PROGRESSING. ?Outcome: Progressing ?Goal: Will verbalize feelings ?Outcome: Progressing ?  ?Problem: Health Behavior/Discharge Planning: ?Goal: Ability to make decisions will improve ?Outcome: Progressing ?Goal: Compliance with therapeutic regimen will improve ?Outcome: Progressing ?  ?Problem: Role Relationship: ?Goal: Will demonstrate positive changes in social behaviors and relationships ?Outcome: Progressing ?  ?Problem: Safety: ?Goal: Ability to disclose and discuss suicidal ideas will improve ?Outcome: Progressing ?Goal: Ability to identify and utilize support systems that promote safety will improve ?Outcome: Progressing ?  ?Problem: Self-Concept: ?Goal: Will verbalize positive feelings about self ?Outcome: Progressing ?Goal: Level of anxiety will decrease ?Outcome: Progressing ?  ?Problem: Self-Concept: ?Goal: Will verbalize positive feelings about self ?Outcome: Progressing ?Goal: Level of anxiety will decrease ?Outcome: Progressing ?  ?  ?

## 2021-07-05 NOTE — Plan of Care (Signed)
?  Problem: Education: ?Goal: Utilization of techniques to improve thought processes will improve ?Outcome: Progressing ?Goal: Knowledge of the prescribed therapeutic regimen will improve ?Outcome: Progressing ?  ?Problem: Health Behavior/Discharge Planning: ?Goal: Compliance with therapeutic regimen will improve ?Outcome: Progressing ?  ?Problem: Safety: ?Goal: Ability to disclose and discuss suicidal ideas will improve ?Outcome: Progressing ?  ?Problem: Self-Concept: ?Goal: Level of anxiety will decrease ?Outcome: Progressing ?  ?

## 2021-07-06 ENCOUNTER — Inpatient Hospital Stay: Payer: 59 | Admitting: Anesthesiology

## 2021-07-06 ENCOUNTER — Encounter: Payer: Self-pay | Admitting: Psychiatry

## 2021-07-06 ENCOUNTER — Ambulatory Visit: Payer: 59

## 2021-07-06 DIAGNOSIS — E119 Type 2 diabetes mellitus without complications: Secondary | ICD-10-CM | POA: Insufficient documentation

## 2021-07-06 LAB — GLUCOSE, CAPILLARY: Glucose-Capillary: 90 mg/dL (ref 70–99)

## 2021-07-06 MED ORDER — LABETALOL HCL 5 MG/ML IV SOLN
INTRAVENOUS | Status: AC
  Filled 2021-07-06: qty 4

## 2021-07-06 MED ORDER — GLYCOPYRROLATE 0.2 MG/ML IJ SOLN
INTRAMUSCULAR | Status: AC
  Administered 2021-07-06: 0.1 mg via INTRAVENOUS
  Filled 2021-07-06: qty 1

## 2021-07-06 MED ORDER — KETAMINE HCL 50 MG/ML IJ SOLN
INTRAMUSCULAR | Status: AC
  Filled 2021-07-06: qty 10

## 2021-07-06 MED ORDER — SODIUM CHLORIDE 0.9 % IV SOLN: 500.0000 mL | Freq: Once | INTRAVENOUS | Status: AC

## 2021-07-06 MED ORDER — KETOROLAC TROMETHAMINE 30 MG/ML IJ SOLN: 30.0000 mg | Freq: Once | INTRAMUSCULAR | Status: DC

## 2021-07-06 MED ORDER — ONDANSETRON HCL 4 MG/2ML IJ SOLN
INTRAMUSCULAR | Status: AC
  Filled 2021-07-06: qty 2

## 2021-07-06 MED ORDER — LABETALOL HCL 5 MG/ML IV SOLN
INTRAVENOUS | Status: DC | PRN
  Administered 2021-07-06: 10 mg via INTRAVENOUS
  Administered 2021-07-06: 5 mg via INTRAVENOUS

## 2021-07-06 MED ORDER — KETAMINE HCL 10 MG/ML IJ SOLN
INTRAMUSCULAR | Status: DC | PRN
  Administered 2021-07-06: 100 mg via INTRAVENOUS

## 2021-07-06 MED ORDER — SUCCINYLCHOLINE CHLORIDE 200 MG/10ML IV SOSY
PREFILLED_SYRINGE | INTRAVENOUS | Status: DC | PRN
  Administered 2021-07-06: 100 mg via INTRAVENOUS

## 2021-07-06 NOTE — Anesthesia Preprocedure Evaluation (Signed)
Anesthesia Evaluation  ?Patient identified by MRN, date of birth, ID band ?Patient awake ? ? ? ?Reviewed: ?Allergy & Precautions, NPO status , Patient's Chart, lab work & pertinent test results ? ?History of Anesthesia Complications ?Negative for: history of anesthetic complications ? ?Airway ?Mallampati: III ? ?TM Distance: >3 FB ?Neck ROM: full ? ? ? Dental ? ?(+) Chipped, Poor Dentition, Missing ?  ?Pulmonary ?asthma , COPD,  ?  ?Pulmonary exam normal ? ? ? ? ? ? ? Cardiovascular ?Exercise Tolerance: Good ?hypertension, Normal cardiovascular exam ? ? ?  ?Neuro/Psych ?PSYCHIATRIC DISORDERS  Neuromuscular disease   ? GI/Hepatic ?negative GI ROS, Neg liver ROS,   ?Endo/Other  ?negative endocrine ROS ? Renal/GU ?negative Renal ROS  ?negative genitourinary ?  ?Musculoskeletal ? ? Abdominal ?  ?Peds ? Hematology ?negative hematology ROS ?(+)   ?Anesthesia Other Findings ?Past Medical History: ?No date: Anxiety ?No date: Asthma ?No date: Depression ? ?History reviewed. No pertinent surgical history. ? ?BMI   ? Body Mass Index: 27.12 kg/m?  ?  ? ? Reproductive/Obstetrics ?negative OB ROS ? ?  ? ? ? ? ? ? ? ? ? ? ? ? ? ?  ?  ? ? ? ? ? ? ? ? ?Anesthesia Physical ? ?Anesthesia Plan ? ?ASA: 3 ? ?Anesthesia Plan: General  ? ?Post-op Pain Management:   ? ?Induction: Intravenous ? ?PONV Risk Score and Plan: Propofol infusion and TIVA ? ?Airway Management Planned: Mask ? ?Additional Equipment:  ? ?Intra-op Plan:  ? ?Post-operative Plan:  ? ?Informed Consent: I have reviewed the patients History and Physical, chart, labs and discussed the procedure including the risks, benefits and alternatives for the proposed anesthesia with the patient or authorized representative who has indicated his/her understanding and acceptance.  ? ? ? ?Dental Advisory Given ? ?Plan Discussed with: Anesthesiologist, CRNA and Surgeon ? ?Anesthesia Plan Comments: (Patient consented for risks of anesthesia including but  not limited to:  ?- adverse reactions to medications ?- risk of airway placement if required ?- damage to eyes, teeth, lips or other oral mucosa ?- nerve damage due to positioning  ?- sore throat or hoarseness ?- Damage to heart, brain, nerves, lungs, other parts of body or loss of life ? ?Patient voiced understanding.)  ? ? ? ? ? ? ?Anesthesia Quick Evaluation ? ?

## 2021-07-06 NOTE — Progress Notes (Signed)
Recreation Therapy Notes ? ?Date: 07/06/2021 ? ?Time: 11:00am  ? ?Location: Craft room   ? ?Behavioral response: N/A ?  ?Intervention Topic: Coping Skills  ? ?Discussion/Intervention: ?Patient refused to attend group.  ? ?Clinical Observations/Feedback:  ?Patient refused to attend group.  ?  ?Roosevelt Eimers LRT/CTRS ? ? ? ? ? ? ? ?Ronnika Collett ?07/06/2021 12:23 PM ?

## 2021-07-06 NOTE — Progress Notes (Signed)
Upon complete wake up c/o's nausea, no emesis, received '100mg'$  ketamine today. '4mg'$  SIVP zofran, sips of gingerale and crackers, slight improvement. Will continue to monitor.  ?

## 2021-07-06 NOTE — Group Note (Signed)
[facility] LCSW Group Therapy Note ? ? ?Group Date: 07/06/2021 ?Start Time: 1300 ?End Time: 1400 ? ? ?Type of Therapy/Topic:  Group Therapy:  Emotion Regulation ? ?Participation Level:  Did Not Attend  ? ?Mood: ? ?Description of Group:   ? The purpose of this group is to assist patients in learning to regulate negative emotions and experience positive emotions. Patients will be guided to discuss ways in which they have been vulnerable to their negative emotions. These vulnerabilities will be juxtaposed with experiences of positive emotions or situations, and patients challenged to use positive emotions to combat negative ones. Special emphasis will be placed on coping with negative emotions in conflict situations, and patients will process healthy conflict resolution skills. ? ?Therapeutic Goals: ?Patient will identify two positive emotions or experiences to reflect on in order to balance out negative emotions:  ?Patient will label two or more emotions that they find the most difficult to experience:  ?Patient will be able to demonstrate positive conflict resolution skills through discussion or role plays:  ? ?Summary of Patient Progress: ?Patient did not attend group despite encouraged participation.  ? ? ? ?Therapeutic Modalities:   ?Cognitive Behavioral Therapy ?Feelings Identification ?Dialectical Behavioral Therapy ? ? ?Durenda Hurt, LCSWA ?

## 2021-07-06 NOTE — Progress Notes (Signed)
Patient is A&Ox3. Not oriented to place. Pt appears with flat affect but cooperative and med compliant. Patient had ECT tx today. Post ECT tx assessment completed. Pt denied any pain, alert, and stated feeling a little dizzy/tired after. Patient vs checked with bp 93/51 and HR 65. BP medication (propranolol) not given. MD notified and patient received ensure along with gatorade to hydrate. Pts vs re-assessed and are currently stable. Patient maintaining hydration and states not feeling as dizzy anymore. Patient rested throughout the day and showered with encouragement. New scrubs provided for patient. Patient denies SI although states he does have "thoughts at times." Patient verbally contracts for safety and denies any plan/intent. Denies HI and A/V/H. Patient resting in bed and in no current distress. Q15 minutes checks continue for safety.  ? ?BP 110/66 (BP Location: Right Arm)   Pulse 70   Temp 98.5 ?F (36.9 ?C) (Oral)   Resp 18   Ht '5\' 6"'$  (1.676 m)   Wt 76.2 kg   SpO2 95%   BMI 27.12 kg/m?  ? ? 07/06/21 0752  ?Psych Admission Type (Psych Patients Only)  ?Admission Status Voluntary  ?Psychosocial Assessment  ?Patient Complaints Anxiety;Depression  ?Eye Contact Fair  ?Facial Expression Flat  ?Affect Depressed;Flat  ?Speech Logical/coherent  ?Interaction Minimal  ?Motor Activity Slow  ?Appearance/Hygiene In scrubs ?(Will encourage proper hygiene)  ?Behavior Characteristics Cooperative;Appropriate to situation  ?Mood Pleasant;Depressed  ?Thought Process  ?Coherency WDL  ?Content WDL  ?Delusions None reported or observed  ?Perception WDL  ?Hallucination None reported or observed  ?Judgment Limited  ?Confusion None  ?Danger to Self  ?Current suicidal ideation? Denies ?(currently denies but "sometimes")  ?Self-Injurious Behavior No self-injurious ideation or behavior indicators observed or expressed   ?Agreement Not to Harm Self Yes  ?Description of Agreement Verbally contracts for safety  ?Danger to Others   ?Danger to Others None reported or observed  ? ? ?

## 2021-07-06 NOTE — Procedures (Signed)
ECT SERVICES ?Physician?s Interval Evaluation & Treatment Note ? ?Patient Identification: Casey Silva ?MRN:  [id number] ?Date of Evaluation:  07/06/2021 ?TX #: [id number] ? ?MADRS:  ? ?MMSE:  ? ?P.E. Findings: ? ?No change to physical exam ? ?Psychiatric Interval Note: ? ?Overall mood seems to be improved although she still stays pretty withdrawn ? ?Subjective: ? ?Patient is a 66 y.o. male seen for evaluation for Electroconvulsive Therapy. ?No complaint.  Memory impairment immediately after treatment ? ?Treatment Summary: ? ? '[]'$   Right Unilateral             '[x]'$  Bilateral ?  ?% Energy : 1.0 ms 100% ? ? Impedance: 930 ohms ? ?Seizure Energy Index: 4562 ?V squared ? ?Postictal Suppression Index: 65% ? ?Seizure Concordance Index: 93% ? ?Medications ? ?Pre Shock: Ketamine 100 mg succinylcholine 100 mg ? ?Post Shock:   ? ?Seizure Duration: 22 seconds EMG 28 seconds EEG ? ? ?Comments: ?Follow-up Friday possibly last treatment ? ?Lungs: ? ?'[x]'$   Clear to auscultation              ? ?'[]'$  Other:  ? ?Heart: ?  ? '[x]'$   Regular rhythm             '[]'$  irregular rhythm ? ? ? '[x]'$   Previous H&P reviewed, patient examined and there are NO CHANGES              ?  ? '[]'$   Previous H&P reviewed, patient examined and there are changes noted. ? ? ?Alethia Berthold, MD ?3/29/20234:02 PM ? ?  ?

## 2021-07-06 NOTE — Transfer of Care (Signed)
Immediate Anesthesia Transfer of Care Note ? ?Patient: Casey Silva ? ?Procedure(s) Performed: ECT TX ? ?Patient Location: PACU ? ?Anesthesia Type:General ? ?Level of Consciousness: drowsy ? ?Airway & Oxygen Therapy: Patient Spontanous Breathing and Patient connected to face mask oxygen ? ?Post-op Assessment: Report given to RN and Post -op Vital signs reviewed and stable ? ?Post vital signs: Reviewed and stable ? ?Last Vitals:  ?Vitals Value Taken Time  ?BP 186/109 07/06/21 1122  ?Temp    ?Pulse 70 07/06/21 1127  ?Resp 11 07/06/21 1127  ?SpO2 99 % 07/06/21 1127  ?Vitals shown include unvalidated device data. ? ?Last Pain:  ?Vitals:  ? 07/06/21 1002  ?TempSrc:   ?PainSc: 0-No pain  ?   ? ?Patients Stated Pain Goal:  (no) (06/27/21 0915) ? ?Complications: No notable events documented. ?

## 2021-07-06 NOTE — H&P (Signed)
Casey Silva is an 66 y.o. male.   ?Chief Complaint: No new complaint.  Mild depression.  No active suicidal thoughts ?HPI: History of recurrent depression ? ?Past Medical History:  ?Diagnosis Date  ? Anxiety   ? Asthma   ? Depression   ? ? ?History reviewed. No pertinent surgical history. ? ?Family History  ?Problem Relation Age of Onset  ? Bipolar disorder Mother   ? ?Social History:  reports that he has never smoked. He has never used smokeless tobacco. He reports that he does not drink alcohol and does not use drugs. ? ?Allergies:  ?Allergies  ?Allergen Reactions  ? Dust Mite Mixed Allergen Ext [Mite (D. Farinae)] Shortness Of Breath  ? Bee Pollen Other (See Comments)  ?  Watery Eyes  ? ? ?Medications Prior to Admission  ?Medication Sig Dispense Refill  ? amLODipine (NORVASC) 10 MG tablet Take 1 tablet (10 mg total) by mouth daily. 30 tablet 0  ? gabapentin (NEURONTIN) 400 MG capsule Take 1 capsule (400 mg total) by mouth 3 (three) times daily. 90 capsule 0  ? LORazepam (ATIVAN) 0.5 MG tablet Take 1 tablet (0.5 mg total) by mouth 2 (two) times daily as needed for anxiety. 30 tablet 0  ? melatonin 5 MG TABS Take 1 tablet (5 mg total) by mouth at bedtime. 30 tablet 0  ? mirtazapine (REMERON) 30 MG tablet Take 1 tablet (30 mg total) by mouth at bedtime. 30 tablet 3  ? propranolol (INDERAL) 10 MG tablet Take 1 tablet (10 mg total) by mouth 3 (three) times daily. 90 tablet 0  ? QUEtiapine (SEROQUEL) 100 MG tablet Take 1 tablet (100 mg total) by mouth at bedtime. 30 tablet 0  ? QUEtiapine (SEROQUEL) 25 MG tablet Take 1 tablet (25 mg total) by mouth 2 (two) times daily. 60 tablet 0  ? traZODone (DESYREL) 100 MG tablet Take 1 tablet (100 mg total) by mouth at bedtime. 30 tablet 0  ? venlafaxine XR (EFFEXOR-XR) 37.5 MG 24 hr capsule Take 3 capsules (112.5 mg total) by mouth daily with breakfast. 30 capsule 0  ? Vitamin D, Ergocalciferol, (DRISDOL) 1.25 MG (50000 UNIT) CAPS capsule Take 1 capsule (50,000 Units total) by  mouth every 7 (seven) days. 5 capsule 0  ? ? ?Results for orders placed or performed during the hospital encounter of 06/23/21 (from the past 48 hour(s))  ?Glucose, capillary     Status: None  ? Collection Time: 07/06/21  7:00 AM  ?Result Value Ref Range  ? Glucose-Capillary 90 70 - 99 mg/dL  ?  Comment: Glucose reference range applies only to samples taken after fasting for at least 8 hours.  ? ?No results found. ? ?Review of Systems  ?Constitutional: Negative.   ?HENT: Negative.    ?Eyes: Negative.   ?Respiratory: Negative.    ?Cardiovascular: Negative.   ?Gastrointestinal: Negative.   ?Musculoskeletal: Negative.   ?Skin: Negative.   ?Neurological: Negative.   ?Psychiatric/Behavioral: Negative.    ? ?Blood pressure (!) 93/51, pulse 70, temperature 98 ?F (36.7 ?C), temperature source Oral, resp. rate 17, height '5\' 6"'$  (1.676 m), weight 76.2 kg, SpO2 98 %. ?Physical Exam ?Vitals and nursing note reviewed.  ?Constitutional:   ?   Appearance: He is well-developed.  ?HENT:  ?   Head: Normocephalic and atraumatic.  ?Eyes:  ?   Conjunctiva/sclera: Conjunctivae normal.  ?   Pupils: Pupils are equal, round, and reactive to light.  ?Cardiovascular:  ?   Heart sounds: Normal heart sounds.  ?Pulmonary:  ?  Effort: Pulmonary effort is normal.  ?Abdominal:  ?   Palpations: Abdomen is soft.  ?Musculoskeletal:     ?   General: Normal range of motion.  ?   Cervical back: Normal range of motion.  ?Skin: ?   General: Skin is warm and dry.  ?Neurological:  ?   General: No focal deficit present.  ?   Mental Status: He is alert.  ?Psychiatric:     ?   Mood and Affect: Mood normal.  ?  ? ?Assessment/Plan ?Having significant memory impairment and confusion after ECT we will probably do 6 treatments by this Friday and then think about stopping as he may have reached a plateau ? ?Alethia Berthold, MD ?07/06/2021, 3:59 PM ? ? ? ?

## 2021-07-06 NOTE — Anesthesia Postprocedure Evaluation (Signed)
Anesthesia Post Note ? ?Patient: Casey Silva ? ?Procedure(s) Performed: ECT TX ? ?Patient location during evaluation: Endoscopy ?Anesthesia Type: General ?Level of consciousness: awake and alert ?Pain management: pain level controlled ?Vital Signs Assessment: post-procedure vital signs reviewed and stable ?Respiratory status: spontaneous breathing, nonlabored ventilation, respiratory function stable and patient connected to nasal cannula oxygen ?Cardiovascular status: blood pressure returned to baseline and stable ?Postop Assessment: no apparent nausea or vomiting ?Anesthetic complications: no ? ? ?No notable events documented. ? ? ?Last Vitals:  ?Vitals:  ? 07/06/21 1154 07/06/21 1200  ?BP: (!) 137/95 (!) 137/98  ?Pulse: 77 69  ?Resp: 20 19  ?Temp: 36.7 ?C 36.6 ?C  ?SpO2:  100%  ?  ?Last Pain:  ?Vitals:  ? 07/06/21 1200  ?TempSrc:   ?PainSc: 0-No pain  ? ? ?  ?  ?  ?  ?  ?  ? ?Precious Haws Dewain Platz ? ? ? ? ?

## 2021-07-06 NOTE — Progress Notes (Signed)
[facility] MD Progress Note ? ?07/06/2021 3:55 PM ?[name]  ?MRN:  [id number] ?Subjective: Patient seen and also ECT done today.  Prior to treatment he reported he was feeling a little better.  Afterwards as usual he is confused with significant memory impairment.  Denies suicidal ideation.  Still somewhat sluggish slow and withdrawn. ?Principal Problem: Severe recurrent major depression with psychotic features ([place]) ?Diagnosis: Principal Problem: ?  Severe recurrent major depression with psychotic features ([place]) ?Active Problems: ?  Primary insomnia ?  GAD (generalized anxiety disorder) ?  Essential hypertension ? ?Total Time spent with patient: 30 minutes ? ?Past Psychiatric History: Past history of depression ? ?Past Medical History:  ?Past Medical History:  ?Diagnosis Date  ? Anxiety   ? Asthma   ? Depression   ? History reviewed. No pertinent surgical history. ?Family History:  ?Family History  ?Problem Relation Age of Onset  ? Bipolar disorder Mother   ? ?Family Psychiatric  History: See previous ?Social History:  ?Social History  ? ?Substance and Sexual Activity  ?Alcohol Use No  ?   ?Social History  ? ?Substance and Sexual Activity  ?Drug Use No  ?  ?Social History  ? ?Socioeconomic History  ? Marital status: Single  ?  Spouse name: Not on file  ? Number of children: 1  ? Years of education: Not on file  ? Highest education level: GED or equivalent  ?Occupational History  ? Not on file  ?Tobacco Use  ? Smoking status: Never  ? Smokeless tobacco: Never  ?Vaping Use  ? Vaping Use: Unknown  ?Substance and Sexual Activity  ? Alcohol use: No  ? Drug use: No  ? Sexual activity: Not Currently  ?Other Topics Concern  ? Not on file  ?Social History Narrative  ? Marital status:  Single   Children: Daughter   Employment:  Unemployment. previous work for [employer].   Alcohol:  None   Drugs:  none   Exercise:  Regularly very   Religion:  Jewish  ? ?Social Determinants of Health  ? ?Financial Resource Strain: Not on  file  ?Food Insecurity: Not on file  ?Transportation Needs: Not on file  ?Physical Activity: Not on file  ?Stress: Not on file  ?Social Connections: Not on file  ? ?Additional Social History:  ?  ?  ?  ?  ?  ?  ?  ?  ?  ?  ?  ? ?Sleep: Fair ? ?Appetite:  Fair ? ?Current Medications: ?Current Facility-Administered Medications  ?Medication Dose Route Frequency Provider Last Rate Last Admin  ? 0.9 %  sodium chloride infusion  500 mL Intravenous Once Phoenyx Paulsen T, MD      ? 0.9 %  sodium chloride infusion  500 mL Intravenous Once Sophea Rackham, [name], MD      ? acetaminophen (TYLENOL) tablet 650 mg  650 mg Oral Once PRN Darrin Nipper, MD      ? Or  ? acetaminophen (TYLENOL) 160 MG/5ML solution 325-650 mg  325-650 mg Oral Q4H PRN Darrin Nipper, MD      ? amLODipine (NORVASC) tablet 10 mg  10 mg Oral Daily Markon Jares, [name], MD   10 mg at 07/06/21 8101  ? diclofenac Sodium (VOLTAREN) 1 % topical gel 2 g  2 g Topical QID Denilson Salminen, [name], MD   2 g at 07/02/21 2149  ? feeding supplement (ENSURE ENLIVE / ENSURE PLUS) liquid 237 mL  237 mL Oral BID BM Peityn Payton T,  MD   237 mL at 07/06/21 1447  ? gabapentin (NEURONTIN) capsule 400 mg  400 mg Oral TID Clanton Emanuelson T, MD   400 mg at 07/06/21 1235  ? hydrOXYzine (ATARAX) tablet 50 mg  50 mg Oral Q6H PRN Kaityln Kallstrom T, MD   50 mg at 07/04/21 2111  ? ibuprofen (ADVIL) tablet 600 mg  600 mg Oral Q6H PRN Alford Gamero, [name], MD   600 mg at 06/27/21 0915  ? ketorolac (TORADOL) 30 MG/ML injection 30 mg  30 mg Intravenous Once Ambermarie Honeyman, [name], MD      ? labetalol (NORMODYNE) 5 MG/ML injection           ? loperamide (IMODIUM) capsule 2 mg  2 mg Oral PRN Larita Fife, MD   2 mg at 07/03/21 1323  ? melatonin tablet 5 mg  5 mg Oral QHS Seraya Jobst, [name], MD   5 mg at 07/05/21 2120  ? midazolam (VERSED) injection 2 mg  2 mg Intravenous Once Mariaha Ellington T, MD      ? mirtazapine (REMERON) tablet 45 mg  45 mg Oral QHS Obie Kallenbach, [name], MD   45 mg at 07/05/21 2119  ? multivitamin with minerals  tablet 1 tablet  1 tablet Oral Daily Quron Ruddy, [name], MD   1 tablet at 07/05/21 0820  ? ondansetron (ZOFRAN) 4 MG/2ML injection           ? ondansetron (ZOFRAN) injection 4 mg  4 mg Intravenous Once PRN Darrin Nipper, MD      ? ondansetron (ZOFRAN-ODT) disintegrating tablet 4 mg  4 mg Oral Q4H PRN Oluwatimileyin Vivier, [name], MD      ? propranolol (INDERAL) tablet 10 mg  10 mg Oral TID Jamesina Gaugh, [name], MD   10 mg at 07/06/21 0751  ? QUEtiapine (SEROQUEL) tablet 100 mg  100 mg Oral QHS Ifeoluwa Beller T, MD   100 mg at 07/05/21 2119  ? traZODone (DESYREL) tablet 100 mg  100 mg Oral QHS Jonica Bickhart T, MD   100 mg at 07/05/21 2119  ? traZODone (DESYREL) tablet 100 mg  100 mg Oral QHS PRN Deyna Carbon, [name], MD   100 mg at 06/25/21 2209  ? venlafaxine XR (EFFEXOR-XR) 24 hr capsule 150 mg  150 mg Oral Q breakfast Jimi Giza, [name], MD   150 mg at 07/06/21 0751  ? Vitamin D (Ergocalciferol) (DRISDOL) capsule 50,000 Units  50,000 Units Oral Q7 days Meg Niemeier, [name], MD   50,000 Units at 07/02/21 1819  ? ? ?Lab Results:  ?Results for orders placed or performed during the hospital encounter of 06/23/21 (from the past 48 hour(s))  ?Glucose, capillary     Status: None  ? Collection Time: 07/06/21  7:00 AM  ?Result Value Ref Range  ? Glucose-Capillary 90 70 - 99 mg/dL  ?  Comment: Glucose reference range applies only to samples taken after fasting for at least 8 hours.  ? ? ?Blood Alcohol level:  ?Lab Results  ?Component Value Date  ? ETH <10 06/07/2021  ? ETH <10 11/22/2020  ? ? ?Metabolic Disorder Labs: ?Lab Results  ?Component Value Date  ? HGBA1C 5.1 06/07/2021  ? MPG 99.67 06/07/2021  ? MPG 111.15 11/24/2020  ? ?No results found for: PROLACTIN ?Lab Results  ?Component Value Date  ? CHOL 197 06/07/2021  ? TRIG 149 06/07/2021  ? HDL 44 06/07/2021  ? CHOLHDL 4.5 06/07/2021  ? VLDL 30 06/07/2021  ? LDLCALC 123 (H) 06/07/2021  ?  LDLCALC 148 (H) 11/24/2020  ? ? ?Physical Findings: ?AIMS:  , ,  ,  ,    ?CIWA:    ?COWS:     ? ?Musculoskeletal: ?Strength & Muscle Tone: within normal limits ?Gait & Station: normal ?Patient leans: N/A ? ?Psychiatric Specialty Exam: ? ?Presentation  ?General Appearance: Appropriate for Environment; Bizarre; Disheveled; Fairly Groomed ? ?Eye Contact:Good ? ?Speech:Clear and Coherent; Normal Rate ? ?Speech Volume:Normal ? ?Handedness:Right ? ? ?Mood and Affect  ?Mood:Anxious; Depressed; Hopeless; Irritable ? ?Affect:Blunt; Depressed ? ? ?Thought Process  ?Thought Processes:Coherent; Linear ? ?Descriptions of Associations:Intact ? ?Orientation:Full (Time, Place and Person) ? ?Thought Content:Logical; WDL ? ?History of Schizophrenia/Schizoaffective disorder:No ? ?Duration of Psychotic Symptoms:N/A ? ?Hallucinations:No data recorded ?Ideas of Reference:None ? ?Suicidal Thoughts:No data recorded ?Homicidal Thoughts:No data recorded ? ?Sensorium  ?Memory:Immediate Fair; Recent Fair; Remote Fair ? ?Judgment:Fair ? ?Insight:Fair ? ? ?Executive Functions  ?Concentration:Fair ? ?Attention Span:Good ? ?Recall:Good ? ?[place] ? ?Language:Good ? ? ?Psychomotor Activity  ?Psychomotor Activity:No data recorded ? ?Assets  ?Assets:Communication Skills; Physical Health; Social Support ? ? ?Sleep  ?Sleep:No data recorded ? ? ?Physical Exam: ?Physical Exam ?Vitals and nursing note reviewed.  ?Constitutional:   ?   Appearance: Normal appearance.  ?HENT:  ?   Head: Normocephalic and atraumatic.  ?   Mouth/Throat:  ?   Pharynx: Oropharynx is clear.  ?Eyes:  ?   Pupils: Pupils are equal, round, and reactive to light.  ?Cardiovascular:  ?   Rate and Rhythm: Normal rate and regular rhythm.  ?Pulmonary:  ?   Effort: Pulmonary effort is normal.  ?   Breath sounds: Normal breath sounds.  ?Abdominal:  ?   General: Abdomen is flat.  ?   Palpations: Abdomen is soft.  ?Musculoskeletal:     ?   General: Normal range of motion.  ?Skin: ?   General: Skin is warm and dry.  ?Neurological:  ?   General: No focal deficit present.   ?   Mental Status: He is alert. Mental status is at baseline.  ?Psychiatric:     ?   Attention and Perception: Attention normal.     ?   Mood and Affect: Mood normal. Affect is blunt.     ?   Speech: Speech is delayed.     ?

## 2021-07-06 NOTE — Plan of Care (Signed)
?  Problem: Education: ?Goal: Knowledge of the prescribed therapeutic regimen will improve ?Outcome: Progressing ?  ?Problem: Coping: ?Goal: Will verbalize feelings ?Outcome: Progressing ?  ?Problem: Health Behavior/Discharge Planning: ?Goal: Compliance with therapeutic regimen will improve ?Outcome: Progressing ?  ?Problem: Safety: ?Goal: Ability to disclose and discuss suicidal ideas will improve ?Outcome: Progressing ?  ?Problem: Self-Concept: ?Goal: Will verbalize positive feelings about self ?Outcome: Progressing ?Goal: Level of anxiety will decrease ?Outcome: Progressing ?  ?

## 2021-07-06 NOTE — Progress Notes (Signed)
Patient has been out in the milieu this evening. He is interacting well with both peers and staff. He denies si hi avh.  He continues to endorse both depression and anxiety, but does admit that ECT treatment is helping him. He is med compliant and received his QHS meds without incident.  Will continue to monitor with q62mnute safety checks. Encouraged patient to seek staff with any concerns. ? ? ? ? ? ?C Butler-Nicholson, LPN    ?

## 2021-07-07 ENCOUNTER — Other Ambulatory Visit: Payer: Self-pay | Admitting: Psychiatry

## 2021-07-07 NOTE — Progress Notes (Signed)
Pt appeared mildly confused this morning (could not recall certain events from yesterday and had to be reminded about receiving ECT). Pt stated having R leg pain (3 out of 10) but did not want any prn for pain and just wanted to rest. At reassessment, pt denied any pain. Patient has improved In maintaining hydration and eating his meals. VS stable. Patient denies SI,HI, and A/V/H with no plan/intent. States he slept "ok" last night and rated his anxiety/depression a 3 out 10. Did not attend groups today and remained mostly isolative to room. No other needs expressed at this moment by patient. Q15 minute checks for safety.  ? ? 07/07/21 0900  ?Psych Admission Type (Psych Patients Only)  ?Admission Status Voluntary  ?Psychosocial Assessment  ?Patient Complaints Insomnia  ?Eye Contact Fair  ?Facial Expression Flat  ?Affect Depressed;Flat  ?Speech Logical/coherent  ?Interaction Isolative;Minimal  ?Motor Activity Slow  ?Appearance/Hygiene Improved  ?Behavior Characteristics Cooperative;Appropriate to situation  ?Mood Depressed;Pleasant  ?Thought Process  ?Coherency WDL  ?Content WDL  ?Delusions None reported or observed  ?Perception WDL  ?Hallucination None reported or observed  ?Judgment Impaired  ?Confusion Mild  ?Danger to Self  ?Current suicidal ideation? Denies  ?Self-Injurious Behavior No self-injurious ideation or behavior indicators observed or expressed   ?Agreement Not to Harm Self Yes  ?Description of Agreement Verbal  ?Danger to Others  ?Danger to Others None reported or observed  ? ? ?

## 2021-07-07 NOTE — Progress Notes (Signed)
Recreation Therapy Notes ? ?Date: 07/07/2021 ? ?Time: 10:50 am  ? ?Location: Courtyard   ? ?Behavioral response: N/A ?  ?Intervention Topic: Relaxation   ? ?Discussion/Intervention: ?Patient refused to attend group.  ? ?Clinical Observations/Feedback:  ?Patient refused to attend group.  ?  ?[name] LRT/CTRS ? ? ? ? ? ? ? ? ?Casey Silva ?07/07/2021 12:40 PM ?

## 2021-07-07 NOTE — Group Note (Addendum)
LCSW Group Therapy Note ? ? ?Group Date: 07/07/2021 ?Start Time: 1300 ?End Time: 7858 ? ? ?Type of Therapy and Topic:  Group Therapy:  ? ?Participation Level:  Did Not Attend ? ?Description of Group:   ? In this group patients will be encouraged to explore what they see as obstacles to their own wellness and recovery. They will be guided to discuss their thoughts, feelings, and behaviors related to these obstacles. The group will process together ways to cope with barriers, with attention given to specific choices patients can make. Each patient will be challenged to identify changes they are motivated to make in order to overcome their obstacles. This group will be process-oriented, with patients participating in exploration of their own experiences as well as giving and receiving support and challenge from other group members. ?  ?Therapeutic Goals: ?Patient will identify personal and current obstacles as they relate to admission. ?Patient will identify barriers that currently interfere with their wellness or overcoming obstacles.  ?Patient will identify feelings, thought process and behaviors related to these barriers. ?Patient will identify two changes they are willing to make to overcome these obstacles:  ?  ? ? ?Summary of Patient Progress:   ? ?X ? ?Therapeutic Modalities:  ? ?Rozann Lesches, LCSWA ?07/07/2021  4:10 PM   ? ?

## 2021-07-07 NOTE — Progress Notes (Signed)
?   07/06/21 2121  ?Vital Signs  ?Pulse Rate 79  ?Pulse Rate Source Monitor  ?Resp 18  ?BP 96/71  ?BP Location Left Arm  ?BP Method Automatic  ?Patient Position (if appropriate) Standing  ?Oxygen Therapy  ?SpO2 98 %  ?O2 Device Room Air  ? ?Patient complaint with medication. Continues to be isolative to his room. Denies SI/HI/A/VH and verbally contracted for safety. Patient endorses depression on 7/10.  ? ?Held hs propranolol order parameters not med. Support and encouragement provided. Patient remains safe. ?

## 2021-07-07 NOTE — Progress Notes (Signed)
[facility] MD Progress Note ? ?07/07/2021 5:30 PM ?Casey Silva  ?MRN:  [id number] ?Subjective: Follow-up for this 66 year old man with depression.  He acknowledges that he is feeling a little bit better but he still stays in bed most of the time and says he still feels a little down.  Not having suicidal thoughts.  Seems to still be having a fair bit of memory problem from the ECT.  No physical complaints ?Principal Problem: Severe recurrent major depression with psychotic features ([place]) ?Diagnosis: Principal Problem: ?  Severe recurrent major depression with psychotic features ([place]) ?Active Problems: ?  Primary insomnia ?  GAD (generalized anxiety disorder) ?  Essential hypertension ? ?Total Time spent with patient: 30 minutes ? ?Past Psychiatric History: Past history of longstanding severe recurrent depression ? ?Past Medical History:  ?Past Medical History:  ?Diagnosis Date  ? Anxiety   ? Asthma   ? Depression   ? History reviewed. No pertinent surgical history. ?Family History:  ?Family History  ?Problem Relation Age of Onset  ? Bipolar disorder Mother   ? ?Family Psychiatric  History: See previous ?Social History:  ?Social History  ? ?Substance and Sexual Activity  ?Alcohol Use No  ?   ?Social History  ? ?Substance and Sexual Activity  ?Drug Use No  ?  ?Social History  ? ?Socioeconomic History  ? Marital status: Single  ?  Spouse name: Not on file  ? Number of children: 1  ? Years of education: Not on file  ? Highest education level: GED or equivalent  ?Occupational History  ? Not on file  ?Tobacco Use  ? Smoking status: Never  ? Smokeless tobacco: Never  ?Vaping Use  ? Vaping Use: Unknown  ?Substance and Sexual Activity  ? Alcohol use: No  ? Drug use: No  ? Sexual activity: Not Currently  ?Other Topics Concern  ? Not on file  ?Social History Narrative  ? Marital status:  Single   Children: Daughter   Employment:  Unemployment. previous work for [employer].   Alcohol:  None   Drugs:  none   Exercise:  Regularly  very   Religion:  Jewish  ? ?Social Determinants of Health  ? ?Financial Resource Strain: Not on file  ?Food Insecurity: Not on file  ?Transportation Needs: Not on file  ?Physical Activity: Not on file  ?Stress: Not on file  ?Social Connections: Not on file  ? ?Additional Social History:  ?  ?  ?  ?  ?  ?  ?  ?  ?  ?  ?  ? ?Sleep: Fair ? ?Appetite:  Fair ? ?Current Medications: ?Current Facility-Administered Medications  ?Medication Dose Route Frequency Provider Last Rate Last Admin  ? 0.9 %  sodium chloride infusion  500 mL Intravenous Once Jaeven Wanzer T, MD      ? 0.9 %  sodium chloride infusion  500 mL Intravenous Once Kaileia Flow, [name], MD      ? acetaminophen (TYLENOL) tablet 650 mg  650 mg Oral Once PRN Darrin Nipper, MD      ? Or  ? acetaminophen (TYLENOL) 160 MG/5ML solution 325-650 mg  325-650 mg Oral Q4H PRN Darrin Nipper, MD      ? amLODipine (NORVASC) tablet 10 mg  10 mg Oral Daily Slyvester Latona, [name], MD   10 mg at 07/07/21 0849  ? diclofenac Sodium (VOLTAREN) 1 % topical gel 2 g  2 g Topical QID Sael Furches, [name], MD   2 g at 07/06/21  2125  ? feeding supplement (ENSURE ENLIVE / ENSURE PLUS) liquid 237 mL  237 mL Oral BID BM Takiah Maiden T, MD   237 mL at 07/07/21 1405  ? gabapentin (NEURONTIN) capsule 400 mg  400 mg Oral TID Zaide Kardell T, MD   400 mg at 07/07/21 1649  ? hydrOXYzine (ATARAX) tablet 50 mg  50 mg Oral Q6H PRN Maloni Musleh, [name], MD   50 mg at 07/04/21 2111  ? ibuprofen (ADVIL) tablet 600 mg  600 mg Oral Q6H PRN Mahasin Riviere T, MD   600 mg at 06/27/21 0915  ? ketorolac (TORADOL) 30 MG/ML injection 30 mg  30 mg Intravenous Once Alanii Ramer T, MD      ? loperamide (IMODIUM) capsule 2 mg  2 mg Oral PRN Larita Fife, MD   2 mg at 07/03/21 1323  ? melatonin tablet 5 mg  5 mg Oral QHS Colm Lyford, [name], MD   5 mg at 07/06/21 2122  ? midazolam (VERSED) injection 2 mg  2 mg Intravenous Once Valary Manahan T, MD      ? mirtazapine (REMERON) tablet 45 mg  45 mg Oral QHS Teyton Pattillo, [name], MD   45 mg at  07/06/21 2122  ? multivitamin with minerals tablet 1 tablet  1 tablet Oral Daily Hideo Googe, [name], MD   1 tablet at 07/07/21 0849  ? ondansetron (ZOFRAN) injection 4 mg  4 mg Intravenous Once PRN Darrin Nipper, MD      ? ondansetron (ZOFRAN-ODT) disintegrating tablet 4 mg  4 mg Oral Q4H PRN Ting Cage, [name], MD      ? propranolol (INDERAL) tablet 10 mg  10 mg Oral TID Keondria Siever, [name], MD   10 mg at 07/07/21 1431  ? QUEtiapine (SEROQUEL) tablet 100 mg  100 mg Oral QHS Sister Carbone, [name], MD   100 mg at 07/06/21 2122  ? traZODone (DESYREL) tablet 100 mg  100 mg Oral QHS Chani Ghanem, [name], MD   100 mg at 07/06/21 2124  ? traZODone (DESYREL) tablet 100 mg  100 mg Oral QHS PRN Aaditya Letizia, [name], MD   100 mg at 06/25/21 2209  ? venlafaxine XR (EFFEXOR-XR) 24 hr capsule 150 mg  150 mg Oral Q breakfast Donovin Kraemer, [name], MD   150 mg at 07/07/21 0849  ? Vitamin D (Ergocalciferol) (DRISDOL) capsule 50,000 Units  50,000 Units Oral Q7 days Yoshio Seliga, [name], MD   50,000 Units at 07/02/21 1819  ? ? ?Lab Results:  ?Results for orders placed or performed during the hospital encounter of 06/23/21 (from the past 48 hour(s))  ?Glucose, capillary     Status: None  ? Collection Time: 07/06/21  7:00 AM  ?Result Value Ref Range  ? Glucose-Capillary 90 70 - 99 mg/dL  ?  Comment: Glucose reference range applies only to samples taken after fasting for at least 8 hours.  ? ? ?Blood Alcohol level:  ?Lab Results  ?Component Value Date  ? ETH <10 06/07/2021  ? ETH <10 11/22/2020  ? ? ?Metabolic Disorder Labs: ?Lab Results  ?Component Value Date  ? HGBA1C 5.1 06/07/2021  ? MPG 99.67 06/07/2021  ? MPG 111.15 11/24/2020  ? ?No results found for: PROLACTIN ?Lab Results  ?Component Value Date  ? CHOL 197 06/07/2021  ? TRIG 149 06/07/2021  ? HDL 44 06/07/2021  ? CHOLHDL 4.5 06/07/2021  ? VLDL 30 06/07/2021  ? LDLCALC 123 (H) 06/07/2021  ? LDLCALC 148 (H) 11/24/2020  ? ? ?Physical Findings: ?  AIMS:  , ,  ,  ,    ?CIWA:    ?COWS:    ? ?Musculoskeletal: ?Strength &  Muscle Tone: within normal limits ?Gait & Station: normal ?Patient leans: N/A ? ?Psychiatric Specialty Exam: ? ?Presentation  ?General Appearance: Appropriate for Environment; Bizarre; Disheveled; Fairly Groomed ? ?Eye Contact:Good ? ?Speech:Clear and Coherent; Normal Rate ? ?Speech Volume:Normal ? ?Handedness:Right ? ? ?Mood and Affect  ?Mood:Anxious; Depressed; Hopeless; Irritable ? ?Affect:Blunt; Depressed ? ? ?Thought Process  ?Thought Processes:Coherent; Linear ? ?Descriptions of Associations:Intact ? ?Orientation:Full (Time, Place and Person) ? ?Thought Content:Logical; WDL ? ?History of Schizophrenia/Schizoaffective disorder:No ? ?Duration of Psychotic Symptoms:N/A ? ?Hallucinations:No data recorded ?Ideas of Reference:None ? ?Suicidal Thoughts:No data recorded ?Homicidal Thoughts:No data recorded ? ?Sensorium  ?Memory:Immediate Fair; Recent Fair; Remote Fair ? ?Judgment:Fair ? ?Insight:Fair ? ? ?Executive Functions  ?Concentration:Fair ? ?Attention Span:Good ? ?Recall:Good ? ?[place] ? ?Language:Good ? ? ?Psychomotor Activity  ?Psychomotor Activity:No data recorded ? ?Assets  ?Assets:Communication Skills; Physical Health; Social Support ? ? ?Sleep  ?Sleep:No data recorded ? ? ?Physical Exam: ?Physical Exam ?Vitals and nursing note reviewed.  ?Constitutional:   ?   Appearance: Normal appearance.  ?HENT:  ?   Head: Normocephalic and atraumatic.  ?   Mouth/Throat:  ?   Pharynx: Oropharynx is clear.  ?Eyes:  ?   Pupils: Pupils are equal, round, and reactive to light.  ?Cardiovascular:  ?   Rate and Rhythm: Normal rate and regular rhythm.  ?Pulmonary:  ?   Effort: Pulmonary effort is normal.  ?   Breath sounds: Normal breath sounds.  ?Abdominal:  ?   General: Abdomen is flat.  ?   Palpations: Abdomen is soft.  ?Musculoskeletal:     ?   General: Normal range of motion.  ?Skin: ?   General: Skin is warm and dry.  ?Neurological:  ?   General: No focal deficit present.  ?   Mental Status: He is alert.  Mental status is at baseline.  ?Psychiatric:     ?   Mood and Affect: Mood normal.     ?   Thought Content: Thought content normal.  ? ?Review of Systems  ?Constitutional: Negative.   ?HENT: Negative

## 2021-07-08 ENCOUNTER — Encounter: Payer: Self-pay | Admitting: Psychiatry

## 2021-07-08 ENCOUNTER — Ambulatory Visit: Payer: 59

## 2021-07-08 ENCOUNTER — Inpatient Hospital Stay: Payer: 59 | Admitting: Anesthesiology

## 2021-07-08 LAB — GLUCOSE, CAPILLARY: Glucose-Capillary: 84 mg/dL (ref 70–99)

## 2021-07-08 MED ORDER — GLYCOPYRROLATE 0.2 MG/ML IJ SOLN: 0.1000 mg | Freq: Once | INTRAMUSCULAR | Status: AC

## 2021-07-08 MED ORDER — KETAMINE HCL 50 MG/5ML IJ SOSY
PREFILLED_SYRINGE | INTRAMUSCULAR | Status: AC
  Filled 2021-07-08: qty 5

## 2021-07-08 MED ORDER — KETOROLAC TROMETHAMINE 30 MG/ML IJ SOLN
INTRAMUSCULAR | Status: AC
  Administered 2021-07-08: 30 mg via INTRAVENOUS
  Filled 2021-07-08: qty 1

## 2021-07-08 MED ORDER — ONDANSETRON HCL 4 MG/2ML IJ SOLN: 4.0000 mg | Freq: Once | INTRAMUSCULAR | Status: AC

## 2021-07-08 MED ORDER — KETAMINE HCL 50 MG/5ML IJ SOSY
PREFILLED_SYRINGE | INTRAMUSCULAR | Status: AC
Start: 2021-07-08 — End: ?
  Filled 2021-07-08: qty 5

## 2021-07-08 MED ORDER — KETOROLAC TROMETHAMINE 30 MG/ML IJ SOLN: 30.0000 mg | Freq: Once | INTRAMUSCULAR | Status: AC

## 2021-07-08 MED ORDER — KETAMINE HCL 50 MG/ML IJ SOLN
INTRAMUSCULAR | Status: AC
  Filled 2021-07-08: qty 1

## 2021-07-08 MED ORDER — SUCCINYLCHOLINE CHLORIDE 200 MG/10ML IV SOSY
PREFILLED_SYRINGE | INTRAVENOUS | Status: DC | PRN
Start: 2021-07-08 — End: 2021-07-08
  Administered 2021-07-08: 100 mg via INTRAVENOUS

## 2021-07-08 MED ORDER — SODIUM CHLORIDE 0.9 % IV SOLN: 500.0000 mL | Freq: Once | INTRAVENOUS | Status: AC

## 2021-07-08 MED ORDER — LABETALOL HCL 5 MG/ML IV SOLN
INTRAVENOUS | Status: DC | PRN
  Administered 2021-07-08: 10 mg via INTRAVENOUS

## 2021-07-08 MED ORDER — SUCCINYLCHOLINE CHLORIDE 200 MG/10ML IV SOSY
PREFILLED_SYRINGE | INTRAVENOUS | Status: AC
  Filled 2021-07-08: qty 10

## 2021-07-08 MED ORDER — GLYCOPYRROLATE 0.2 MG/ML IJ SOLN
INTRAMUSCULAR | Status: AC
  Administered 2021-07-08: 0.2 mg via INTRAVENOUS
  Filled 2021-07-08: qty 1

## 2021-07-08 MED ORDER — ONDANSETRON HCL 4 MG/2ML IJ SOLN
INTRAMUSCULAR | Status: AC
  Administered 2021-07-08: 4 mg via INTRAVENOUS
  Filled 2021-07-08: qty 2

## 2021-07-08 MED ORDER — KETAMINE HCL 10 MG/ML IJ SOLN
INTRAMUSCULAR | Status: DC | PRN
  Administered 2021-07-08: 100 mg via INTRAVENOUS

## 2021-07-08 NOTE — Progress Notes (Signed)
Patient was given his morning medication, per MD request, with a few sips of water. ?

## 2021-07-08 NOTE — Progress Notes (Signed)
Recreation Therapy Notes ? ? ?Date: 07/08/2021 ? ?Time: 11:00am    ? ?Location: Craft room  ? ?Behavioral response: N/A ?  ?Intervention Topic: Strengths  ? ?Discussion/Intervention: ?Patient refused to attend group.  ? ?Clinical Observations/Feedback:  ?Patient refused to attend group.  ?  ?[name] LRT/CTRS ? ? ? ? ? ? ? ?Casey Silva ?07/08/2021 12:09 PM ?

## 2021-07-08 NOTE — Plan of Care (Signed)
?  Problem: Education: ?Goal: Utilization of techniques to improve thought processes will improve ?Outcome: Progressing ?Goal: Knowledge of the prescribed therapeutic regimen will improve ?Outcome: Progressing ?  ?Problem: Activity: ?Goal: Interest or engagement in leisure activities will improve ?Outcome: Progressing ?Goal: Imbalance in normal sleep/wake cycle will improve ?Outcome: Progressing ?  ?Problem: Coping: ?Goal: Coping ability will improve ?Description: PT IS PROGRESSING. ?Outcome: Progressing ?Goal: Will verbalize feelings ?Outcome: Progressing ?  ?Problem: Health Behavior/Discharge Planning: ?Goal: Ability to make decisions will improve ?Outcome: Progressing ?Goal: Compliance with therapeutic regimen will improve ?Outcome: Progressing ?  ?Problem: Role Relationship: ?Goal: Will demonstrate positive changes in social behaviors and relationships ?Outcome: Progressing ?  ?Problem: Safety: ?Goal: Ability to disclose and discuss suicidal ideas will improve ?Outcome: Progressing ?Goal: Ability to identify and utilize support systems that promote safety will improve ?Outcome: Progressing ?  ?

## 2021-07-08 NOTE — Plan of Care (Signed)
D- Patient alert and oriented. Patient presented in a pleasant mood on assessment reporting that he slept "fair" last night and had no complaints to voice to this Probation officer. Patient endorsed depression, anxiety and hopelessness, rating them all a "6/10". Patient stated that things in general is why he's feeling this way. Patient denied SI, HI, AVH, and pain at this time. Patient's goal for today is "getting through the day", in which he will "apply myself", in order to achieve his goal. ? ?A- Scheduled medications administered to patient, per MD orders. Support and encouragement provided.  Routine safety checks conducted every 15 minutes.  Patient informed to notify staff with problems or concerns. ? ?R- No adverse drug reactions noted. Patient contracts for safety at this time. Patient compliant with medications and treatment plan. Patient receptive, calm, and cooperative. Patient interacts well with others on the unit.  Patient remains safe at this time. ? ?Problem: Education: ?Goal: Utilization of techniques to improve thought processes will improve ?Outcome: Progressing ?Goal: Knowledge of the prescribed therapeutic regimen will improve ?Outcome: Progressing ?  ?Problem: Activity: ?Goal: Interest or engagement in leisure activities will improve ?Outcome: Progressing ?Goal: Imbalance in normal sleep/wake cycle will improve ?Outcome: Progressing ?  ?Problem: Coping: ?Goal: Coping ability will improve ?Description: PT IS PROGRESSING. ?Outcome: Progressing ?Goal: Will verbalize feelings ?Outcome: Progressing ?  ?Problem: Health Behavior/Discharge Planning: ?Goal: Ability to make decisions will improve ?Outcome: Progressing ?Goal: Compliance with therapeutic regimen will improve ?Outcome: Progressing ?  ?Problem: Role Relationship: ?Goal: Will demonstrate positive changes in social behaviors and relationships ?Outcome: Progressing ?  ?Problem: Safety: ?Goal: Ability to disclose and discuss suicidal ideas will  improve ?Outcome: Progressing ?Goal: Ability to identify and utilize support systems that promote safety will improve ?Outcome: Progressing ?  ?Problem: Self-Concept: ?Goal: Will verbalize positive feelings about self ?Outcome: Progressing ?Goal: Level of anxiety will decrease ?Outcome: Progressing ?  ?

## 2021-07-08 NOTE — H&P (Signed)
Casey Silva is an 66 y.o. male.   ?Chief Complaint: depressed ?HPI: still depressed ? ?Past Medical History:  ?Diagnosis Date  ? Anxiety   ? Asthma   ? Depression   ? ? ?History reviewed. No pertinent surgical history. ? ?Family History  ?Problem Relation Age of Onset  ? Bipolar disorder Mother   ? ?Social History:  reports that he has never smoked. He has never used smokeless tobacco. He reports that he does not drink alcohol and does not use drugs. ? ?Allergies:  ?Allergies  ?Allergen Reactions  ? Dust Mite Mixed Allergen Ext [Mite (D. Farinae)] Shortness Of Breath  ? Bee Pollen Other (See Comments)  ?  Watery Eyes  ? ? ?Medications Prior to Admission  ?Medication Sig Dispense Refill  ? amLODipine (NORVASC) 10 MG tablet Take 1 tablet (10 mg total) by mouth daily. 30 tablet 0  ? gabapentin (NEURONTIN) 400 MG capsule Take 1 capsule (400 mg total) by mouth 3 (three) times daily. 90 capsule 0  ? LORazepam (ATIVAN) 0.5 MG tablet Take 1 tablet (0.5 mg total) by mouth 2 (two) times daily as needed for anxiety. 30 tablet 0  ? melatonin 5 MG TABS Take 1 tablet (5 mg total) by mouth at bedtime. 30 tablet 0  ? mirtazapine (REMERON) 30 MG tablet Take 1 tablet (30 mg total) by mouth at bedtime. 30 tablet 3  ? propranolol (INDERAL) 10 MG tablet Take 1 tablet (10 mg total) by mouth 3 (three) times daily. 90 tablet 0  ? QUEtiapine (SEROQUEL) 100 MG tablet Take 1 tablet (100 mg total) by mouth at bedtime. 30 tablet 0  ? QUEtiapine (SEROQUEL) 25 MG tablet Take 1 tablet (25 mg total) by mouth 2 (two) times daily. 60 tablet 0  ? traZODone (DESYREL) 100 MG tablet Take 1 tablet (100 mg total) by mouth at bedtime. 30 tablet 0  ? venlafaxine XR (EFFEXOR-XR) 37.5 MG 24 hr capsule Take 3 capsules (112.5 mg total) by mouth daily with breakfast. 30 capsule 0  ? Vitamin D, Ergocalciferol, (DRISDOL) 1.25 MG (50000 UNIT) CAPS capsule Take 1 capsule (50,000 Units total) by mouth every 7 (seven) days. 5 capsule 0  ? ? ?Results for orders placed  or performed during the hospital encounter of 06/23/21 (from the past 48 hour(s))  ?Glucose, capillary     Status: None  ? Collection Time: 07/08/21  7:00 AM  ?Result Value Ref Range  ? Glucose-Capillary 84 70 - 99 mg/dL  ?  Comment: Glucose reference range applies only to samples taken after fasting for at least 8 hours.  ? ?No results found. ? ?Review of Systems  ?Constitutional: Negative.   ?HENT: Negative.    ?Eyes: Negative.   ?Respiratory: Negative.    ?Cardiovascular: Negative.   ?Gastrointestinal: Negative.   ?Musculoskeletal: Negative.   ?Skin: Negative.   ?Neurological: Negative.   ?Psychiatric/Behavioral:  Positive for dysphoric mood.   ? ?Blood pressure 133/81, pulse (!) 59, temperature 98.2 ?F (36.8 ?C), temperature source Oral, resp. rate 18, height '5\' 6"'$  (1.676 m), weight 76.2 kg, SpO2 98 %. ?Physical Exam ?Vitals and nursing note reviewed.  ?Constitutional:   ?   Appearance: He is well-developed.  ?HENT:  ?   Head: Normocephalic and atraumatic.  ?Eyes:  ?   Conjunctiva/sclera: Conjunctivae normal.  ?   Pupils: Pupils are equal, round, and reactive to light.  ?Cardiovascular:  ?   Heart sounds: Normal heart sounds.  ?Pulmonary:  ?   Effort: Pulmonary effort is normal.  ?  Abdominal:  ?   Palpations: Abdomen is soft.  ?Musculoskeletal:     ?   General: Normal range of motion.  ?   Cervical back: Normal range of motion.  ?Skin: ?   General: Skin is warm and dry.  ?Neurological:  ?   General: No focal deficit present.  ?   Mental Status: He is alert.  ?Psychiatric:     ?   Behavior: Behavior normal.     ?   Thought Content: Thought content normal.  ?  ? ?Assessment/Plan ?Continue index tx ? ?Alethia Berthold, MD ?07/08/2021, 12:14 PM ? ? ? ?

## 2021-07-08 NOTE — Transfer of Care (Signed)
Immediate Anesthesia Transfer of Care Note ? ?Patient: Etan Vasudevan ? ?Procedure(s) Performed: ECT TX ? ?Patient Location: PACU ? ?Anesthesia Type:General ? ?Level of Consciousness: drowsy and patient cooperative ? ?Airway & Oxygen Therapy: Patient Spontanous Breathing and Patient connected to nasal cannula oxygen ? ?Post-op Assessment: Report given to RN and Post -op Vital signs reviewed and stable ? ?Post vital signs: Reviewed and stable ? ?Last Vitals:  ?Vitals Value Taken Time  ?BP 152/87 07/08/21 1416  ?Temp    ?Pulse 86 07/08/21 1416  ?Resp 23 07/08/21 1416  ?SpO2 93 % 07/08/21 1416  ?Vitals shown include unvalidated device data. ? ?Last Pain:  ?Vitals:  ? 07/08/21 1020  ?TempSrc:   ?PainSc: 0-No pain  ?   ? ?Patients Stated Pain Goal: 0 (07/07/21 0900) ? ?Complications: No notable events documented. ?

## 2021-07-08 NOTE — Anesthesia Preprocedure Evaluation (Signed)
Anesthesia Evaluation  ?Patient identified by MRN, date of birth, ID band ?Patient awake ? ? ? ?Reviewed: ?Allergy & Precautions, NPO status , Patient's Chart, lab work & pertinent test results ? ?History of Anesthesia Complications ?Negative for: history of anesthetic complications ? ?Airway ?Mallampati: III ? ?TM Distance: >3 FB ?Neck ROM: full ? ? ? Dental ? ?(+) Chipped, Poor Dentition, Missing ?  ?Pulmonary ?asthma , COPD,  ?  ?Pulmonary exam normal ? ? ? ? ? ? ? Cardiovascular ?Exercise Tolerance: Good ?hypertension, Normal cardiovascular exam ? ? ?  ?Neuro/Psych ?PSYCHIATRIC DISORDERS Anxiety Depression Bipolar Disorder  Neuromuscular disease   ? GI/Hepatic ?negative GI ROS, Neg liver ROS,   ?Endo/Other  ?negative endocrine ROS ? Renal/GU ?negative Renal ROS  ?negative genitourinary ?  ?Musculoskeletal ? ? Abdominal ?  ?Peds ? Hematology ?negative hematology ROS ?(+)   ?Anesthesia Other Findings ?Past Medical History: ?No date: Anxiety ?No date: Asthma ?No date: Depression ? ?History reviewed. No pertinent surgical history. ? ?BMI   ? Body Mass Index: 27.12 kg/m?  ?  ? ? Reproductive/Obstetrics ?negative OB ROS ? ?  ? ? ? ? ? ? ? ? ? ? ? ? ? ?  ?  ? ? ? ? ? ? ? ? ?Anesthesia Physical ? ?Anesthesia Plan ? ?ASA: 3 ? ?Anesthesia Plan: General  ? ?Post-op Pain Management:   ? ?Induction: Intravenous ? ?PONV Risk Score and Plan: Propofol infusion and TIVA ? ?Airway Management Planned: Mask ? ?Additional Equipment:  ? ?Intra-op Plan:  ? ?Post-operative Plan:  ? ?Informed Consent: I have reviewed the patients History and Physical, chart, labs and discussed the procedure including the risks, benefits and alternatives for the proposed anesthesia with the patient or authorized representative who has indicated his/her understanding and acceptance.  ? ? ? ?Dental Advisory Given ? ?Plan Discussed with: Anesthesiologist, CRNA and Surgeon ? ?Anesthesia Plan Comments: (Patient consented for  risks of anesthesia including but not limited to:  ?- adverse reactions to medications ?- risk of airway placement if required ?- damage to eyes, teeth, lips or other oral mucosa ?- nerve damage due to positioning  ?- sore throat or hoarseness ?- Damage to heart, brain, nerves, lungs, other parts of body or loss of life ? ?Patient voiced understanding.)  ? ? ? ? ? ? ?Anesthesia Quick Evaluation ? ?

## 2021-07-08 NOTE — Progress Notes (Signed)
Report was received on patient returning from ECT. Patient is A&Ox3. It was reported that he knows his name and where he is, he just doesn't know why he's in the hospital. This writer is awaiting patient to return to the unit. This writer called to get patient's lunch tray sent up to the unit.  ?

## 2021-07-08 NOTE — Procedures (Signed)
ECT SERVICES ?Physician?s Interval Evaluation & Treatment Note ? ?Patient Identification: Casey Silva ?MRN:  [id number] ?Date of Evaluation:  07/08/2021 ?Herald #: 6 ? ?MADRS:  ? ?MMSE:  ? ?P.E. Findings: ? ?No change to physical exam ? ?Psychiatric Interval Note: ? ?Blunted although he seems a little brighter ? ?Subjective: ? ?Patient is a 66 y.o. male seen for evaluation for Electroconvulsive Therapy. ?Still down and dysphoric ? ?Treatment Summary: ? ? '[]'$   Right Unilateral             '[x]'$  Bilateral ?  ?% Energy : 1.0 ms 100% ? ? Impedance: 2070 ohms ? ?Seizure Energy Index: 9449 ?V squared ? ?Postictal Suppression Index: 87% ? ?Seizure Concordance Index: 97% ? ?Medications ? ?Pre Shock: Ketamine 100 mg succinylcholine 100 mg ? ?Post Shock:   ? ?Seizure Duration: 22 seconds EMG 29 seconds EEG ? ? ?Comments: ?Adequate seizure on ketamine and tolerated ketamine okay.  We are going to continue treatments into next week as I think we are seeing a little response it has just been very slow.  Some delay because of inadequate seizures at times. ? ?Lungs: ? ?'[x]'$   Clear to auscultation              ? ?'[]'$  Other:  ? ?Heart: ?  ? '[x]'$   Regular rhythm             '[]'$  irregular rhythm ? ? ? '[x]'$   Previous H&P reviewed, patient examined and there are NO CHANGES              ?  ? '[]'$   Previous H&P reviewed, patient examined and there are changes noted. ? ? ?Alethia Berthold, MD ?3/31/20235:07 PM ? ? ? ?

## 2021-07-08 NOTE — Progress Notes (Signed)
[facility] MD Progress Note ? ?07/08/2021 2:23 PM ?Casey Silva  ?MRN:  [id number] ?Subjective: Follow-up patient with severe depression.  Patient had ECT again today which was successful without any complications.  He reports feeling that his mood is a little bit better but he continues to display very low energy with little attempt to get out of his bed or take care of himself.  Told staff that he would just as soon stay in the hospital indefinitely as his home situation is so lonely.  Cooperative.  No physical complaints ?Principal Problem: Severe recurrent major depression with psychotic features ([place]) ?Diagnosis: Principal Problem: ?  Severe recurrent major depression with psychotic features ([place]) ?Active Problems: ?  Primary insomnia ?  GAD (generalized anxiety disorder) ?  Essential hypertension ? ?Total Time spent with patient: 30 minutes ? ?Past Psychiatric History: Past history of depression and suicidality and severe anxiety ? ?Past Medical History:  ?Past Medical History:  ?Diagnosis Date  ? Anxiety   ? Asthma   ? Depression   ? History reviewed. No pertinent surgical history. ?Family History:  ?Family History  ?Problem Relation Age of Onset  ? Bipolar disorder Mother   ? ?Family Psychiatric  History: See previous ?Social History:  ?Social History  ? ?Substance and Sexual Activity  ?Alcohol Use No  ?   ?Social History  ? ?Substance and Sexual Activity  ?Drug Use No  ?  ?Social History  ? ?Socioeconomic History  ? Marital status: Single  ?  Spouse name: Not on file  ? Number of children: 1  ? Years of education: Not on file  ? Highest education level: GED or equivalent  ?Occupational History  ? Not on file  ?Tobacco Use  ? Smoking status: Never  ? Smokeless tobacco: Never  ?Vaping Use  ? Vaping Use: Unknown  ?Substance and Sexual Activity  ? Alcohol use: No  ? Drug use: No  ? Sexual activity: Not Currently  ?Other Topics Concern  ? Not on file  ?Social History Narrative  ? Marital status:  Single   Children:  Daughter   Employment:  Unemployment. previous work for [employer].   Alcohol:  None   Drugs:  none   Exercise:  Regularly very   Religion:  Jewish  ? ?Social Determinants of Health  ? ?Financial Resource Strain: Not on file  ?Food Insecurity: Not on file  ?Transportation Needs: Not on file  ?Physical Activity: Not on file  ?Stress: Not on file  ?Social Connections: Not on file  ? ?Additional Social History:  ?  ?  ?  ?  ?  ?  ?  ?  ?  ?  ?  ? ?Sleep: Fair ? ?Appetite:  Fair ? ?Current Medications: ?Current Facility-Administered Medications  ?Medication Dose Route Frequency Provider Last Rate Last Admin  ? 0.9 %  sodium chloride infusion  500 mL Intravenous Once Egan Berkheimer T, MD      ? 0.9 %  sodium chloride infusion  500 mL Intravenous Once Charlotta Lapaglia, [name], MD      ? acetaminophen (TYLENOL) tablet 650 mg  650 mg Oral Once PRN Darrin Nipper, MD      ? Or  ? acetaminophen (TYLENOL) 160 MG/5ML solution 325-650 mg  325-650 mg Oral Q4H PRN Darrin Nipper, MD      ? amLODipine (NORVASC) tablet 10 mg  10 mg Oral Daily Susette Seminara, [name], MD   10 mg at 07/08/21 6568  ? diclofenac Sodium (VOLTAREN) 1 %  topical gel 2 g  2 g Topical QID Meriel Kelliher, [name], MD   2 g at 07/06/21 2125  ? feeding supplement (ENSURE ENLIVE / ENSURE PLUS) liquid 237 mL  237 mL Oral BID BM Kelli Robeck T, MD   237 mL at 07/07/21 1405  ? gabapentin (NEURONTIN) capsule 400 mg  400 mg Oral TID Precious Gilchrest, [name], MD   400 mg at 07/08/21 1610  ? hydrOXYzine (ATARAX) tablet 50 mg  50 mg Oral Q6H PRN Chistopher Mangino, [name], MD   50 mg at 07/04/21 2111  ? ibuprofen (ADVIL) tablet 600 mg  600 mg Oral Q6H PRN Keoki Mchargue, [name], MD   600 mg at 06/27/21 0915  ? loperamide (IMODIUM) capsule 2 mg  2 mg Oral PRN Larita Fife, MD   2 mg at 07/03/21 1323  ? melatonin tablet 5 mg  5 mg Oral QHS Deairra Halleck, [name], MD   5 mg at 07/07/21 2119  ? midazolam (VERSED) injection 2 mg  2 mg Intravenous Once Lona Six T, MD      ? mirtazapine (REMERON) tablet 45 mg  45 mg Oral  QHS Trei Schoch, [name], MD   45 mg at 07/07/21 2119  ? multivitamin with minerals tablet 1 tablet  1 tablet Oral Daily Dalaya Suppa, [name], MD   1 tablet at 07/08/21 [phone]  ? ondansetron (ZOFRAN) injection 4 mg  4 mg Intravenous Once PRN Darrin Nipper, MD      ? ondansetron (ZOFRAN-ODT) disintegrating tablet 4 mg  4 mg Oral Q4H PRN Charmika Macdonnell, [name], MD      ? propranolol (INDERAL) tablet 10 mg  10 mg Oral TID Voncile Schwarz, [name], MD   10 mg at 07/08/21 5409  ? QUEtiapine (SEROQUEL) tablet 100 mg  100 mg Oral QHS Nellene Courtois, [name], MD   100 mg at 07/07/21 2120  ? traZODone (DESYREL) tablet 100 mg  100 mg Oral QHS Nymir Ringler, [name], MD   100 mg at 07/07/21 2120  ? traZODone (DESYREL) tablet 100 mg  100 mg Oral QHS PRN Kimberly Coye, [name], MD   100 mg at 06/25/21 2209  ? venlafaxine XR (EFFEXOR-XR) 24 hr capsule 150 mg  150 mg Oral Q breakfast Muriah Harsha, [name], MD   150 mg at 07/08/21 8119  ? Vitamin D (Ergocalciferol) (DRISDOL) capsule 50,000 Units  50,000 Units Oral Q7 days Ramandeep Arington, [name], MD   50,000 Units at 07/02/21 1819  ? ? ?Lab Results:  ?Results for orders placed or performed during the hospital encounter of 06/23/21 (from the past 48 hour(s))  ?Glucose, capillary     Status: None  ? Collection Time: 07/08/21  7:00 AM  ?Result Value Ref Range  ? Glucose-Capillary 84 70 - 99 mg/dL  ?  Comment: Glucose reference range applies only to samples taken after fasting for at least 8 hours.  ? ? ?Blood Alcohol level:  ?Lab Results  ?Component Value Date  ? ETH <10 06/07/2021  ? ETH <10 11/22/2020  ? ? ?Metabolic Disorder Labs: ?Lab Results  ?Component Value Date  ? HGBA1C 5.1 06/07/2021  ? MPG 99.67 06/07/2021  ? MPG 111.15 11/24/2020  ? ?No results found for: PROLACTIN ?Lab Results  ?Component Value Date  ? CHOL 197 06/07/2021  ? TRIG 149 06/07/2021  ? HDL 44 06/07/2021  ? CHOLHDL 4.5 06/07/2021  ? VLDL 30 06/07/2021  ? LDLCALC 123 (H) 06/07/2021  ? LDLCALC 148 (H) 11/24/2020  ? ? ?Physical Findings: ?AIMS:  , ,  ,  ,    ?  CIWA:    ?COWS:     ? ?Musculoskeletal: ?Strength & Muscle Tone: within normal limits ?Gait & Station: normal ?Patient leans: N/A ? ?Psychiatric Specialty Exam: ? ?Presentation  ?General Appearance: Appropriate for Environment; Bizarre; Disheveled; Fairly Groomed ? ?Eye Contact:Good ? ?Speech:Clear and Coherent; Normal Rate ? ?Speech Volume:Normal ? ?Handedness:Right ? ? ?Mood and Affect  ?Mood:Anxious; Depressed; Hopeless; Irritable ? ?Affect:Blunt; Depressed ? ? ?Thought Process  ?Thought Processes:Coherent; Linear ? ?Descriptions of Associations:Intact ? ?Orientation:Full (Time, Place and Person) ? ?Thought Content:Logical; WDL ? ?History of Schizophrenia/Schizoaffective disorder:No ? ?Duration of Psychotic Symptoms:N/A ? ?Hallucinations:No data recorded ?Ideas of Reference:None ? ?Suicidal Thoughts:No data recorded ?Homicidal Thoughts:No data recorded ? ?Sensorium  ?Memory:Immediate Fair; Recent Fair; Remote Fair ? ?Judgment:Fair ? ?Insight:Fair ? ? ?Executive Functions  ?Concentration:Fair ? ?Attention Span:Good ? ?Recall:Good ? ?[place] ? ?Language:Good ? ? ?Psychomotor Activity  ?Psychomotor Activity:No data recorded ? ?Assets  ?Assets:Communication Skills; Physical Health; Social Support ? ? ?Sleep  ?Sleep:No data recorded ? ? ?Physical Exam: ?Physical Exam ?Vitals and nursing note reviewed.  ?Constitutional:   ?   Appearance: Normal appearance.  ?HENT:  ?   Head: Normocephalic and atraumatic.  ?   Mouth/Throat:  ?   Pharynx: Oropharynx is clear.  ?Eyes:  ?   Pupils: Pupils are equal, round, and reactive to light.  ?Cardiovascular:  ?   Rate and Rhythm: Normal rate and regular rhythm.  ?Pulmonary:  ?   Effort: Pulmonary effort is normal.  ?   Breath sounds: Normal breath sounds.  ?Abdominal:  ?   General: Abdomen is flat.  ?   Palpations: Abdomen is soft.  ?Musculoskeletal:     ?   General: Normal range of motion.  ?Skin: ?   General: Skin is warm and dry.  ?Neurological:  ?   General: No focal deficit  present.  ?   Mental Status: He is alert. Mental status is at baseline.  ?Psychiatric:     ?   Attention and Perception: Attention normal.     ?   Mood and Affect: Mood normal. Affect is blunt.     ?   Speech: Sp

## 2021-07-08 NOTE — Progress Notes (Signed)
Continues to be isolative to room except for snack and medications.  He is pleasant and will engage in conversation. Clear and logical in his thoughts and speech. He is med compliant and received his meds without incident. He denies si  hi avh, does endorse mild depression and anxiety at this encounter. Encouraged patient to seek staff with any concerns.  Will continue to monitor with q 15 minute safety checks. ? ? ?C Butler-Nicholson, LPN ?

## 2021-07-08 NOTE — Anesthesia Postprocedure Evaluation (Signed)
Anesthesia Post Note ? ?Patient: Casey Silva ? ?Procedure(s) Performed: ECT TX ? ?Patient location during evaluation: PACU ?Anesthesia Type: General ?Level of consciousness: awake and alert, oriented and patient cooperative ?Pain management: pain level controlled ?Vital Signs Assessment: post-procedure vital signs reviewed and stable ?Respiratory status: spontaneous breathing, nonlabored ventilation and respiratory function stable ?Cardiovascular status: blood pressure returned to baseline and stable ?Postop Assessment: adequate PO intake ?Anesthetic complications: no ? ? ?No notable events documented. ? ? ?Last Vitals:  ?Vitals:  ? 07/08/21 1430 07/08/21 1445  ?BP: (!) 143/64 (!) 145/90  ?Pulse: 76 67  ?Resp: 19 (!) 25  ?Temp:    ?SpO2: 100% 96%  ?  ?Last Pain:  ?Vitals:  ? 07/08/21 1445  ?TempSrc:   ?PainSc: 0-No pain  ? ? ?  ?  ?  ?  ?  ?  ? ?Darrin Nipper ? ? ? ? ?

## 2021-07-08 NOTE — Group Note (Signed)
[facility] LCSW Group Therapy Note ? ? ?Group Date: 07/08/2021 ?Start Time: 1300 ?End Time: 1400 ? ?Type of Therapy and Topic:  Group Therapy:  Feelings around Relapse and Recovery ? ?Participation Level:  Did Not Attend  ? ?Mood: ? ?Description of Group:   ? Patients in this group will discuss emotions they experience before and after a relapse. They will process how experiencing these feelings, or avoidance of experiencing them, relates to having a relapse. Facilitator will guide patients to explore emotions they have related to recovery. Patients will be encouraged to process which emotions are more powerful. They will be guided to discuss the emotional reaction significant others in their lives may have to patients? relapse or recovery. Patients will be assisted in exploring ways to respond to the emotions of others without this contributing to a relapse. ? ?Therapeutic Goals: ?Patient will identify two or more emotions that lead to relapse for them:  ?Patient will identify two emotions that result when they relapse:  ?Patient will identify two emotions related to recovery:  ?Patient will demonstrate ability to communicate their needs through discussion and/or role plays. ? ? ?Summary of Patient Progress: ? ?Patient did not attend group despite encouraged participation.  ? ? ?Therapeutic Modalities:   ?Cognitive Behavioral Therapy ?Solution-Focused Therapy ?Assertiveness Training ?Relapse Prevention Therapy ? ? ?Durenda Hurt, LCSWA ?

## 2021-07-08 NOTE — Progress Notes (Signed)
Per MD request, patient was given his 1400 scheduled Propanolol with his evening medication. ?

## 2021-07-09 NOTE — Group Note (Signed)
LCSW Group Therapy Note ? ?Group Date: 07/09/2021 ?Start Time: 1300 ?End Time: 1400 ? ? ?Type of Therapy and Topic:  Group Therapy - Healthy vs Unhealthy Coping Skills ? ?Participation Level:  Did Not Attend  ? ?Description of Group ?The focus of this group was to determine what unhealthy coping techniques typically are used by group members and what healthy coping techniques would be helpful in coping with various problems. Patients were guided in becoming aware of the differences between healthy and unhealthy coping techniques. Patients were asked to identify 2-3 healthy coping skills they would like to learn to use more effectively. ? ?Therapeutic Goals ?Patients learned that coping is what human beings do all day long to deal with various situations in their lives ?Patients defined and discussed healthy vs unhealthy coping techniques ?Patients identified their preferred coping techniques and identified whether these were healthy or unhealthy ?Patients determined 2-3 healthy coping skills they would like to become more familiar with and use more often. ?Patients provided support and ideas to each other ? ? ?Summary of Patient Progress: Patient did not attend group despite encouraged participation. ? ? ? ?Therapeutic Modalities ?Cognitive Behavioral Therapy ?Motivational Interviewing ? ?Berniece Salines, LCSWA ?07/09/2021  4:43 PM   ?

## 2021-07-09 NOTE — Progress Notes (Signed)
Patient was mostly in bed throughout this shift but did not complain of anything. Took medications and meals as scheduled. Did not express any concerns.  ?

## 2021-07-09 NOTE — Progress Notes (Signed)
[facility] MD Progress Note ? ?07/09/2021 12:02 PM ?Casey Silva  ?MRN:  [id number] ?Subjective:  Patient got up for medications and breakfast. alert and oriented x 4 and denying thoughts of self-harm. Denying hallucinations. Cooperative and active in the milieu. Has no sign of distress.  ? Patient reports feeling somewhat better today with some improvement in his mood. He is displaying some amnesia , is mostly resting in his room and is taking his meds.  ?Principal Problem: Severe recurrent major depression with psychotic features ([place]) ?Diagnosis: Principal Problem: ?  Severe recurrent major depression with psychotic features ([place]) ?Active Problems: ?  Primary insomnia ?  GAD (generalized anxiety disorder) ?  Essential hypertension ? ?Total Time spent with patient: 20 minutes ? ?Past Psychiatric History: MDD ? ?Past Medical History:  ?Past Medical History:  ?Diagnosis Date  ? Anxiety   ? Asthma   ? Depression   ? History reviewed. No pertinent surgical history. ?Family History:  ?Family History  ?Problem Relation Age of Onset  ? Bipolar disorder Mother   ? ?Family Psychiatric  History:  ?Social History:  ?Social History  ? ?Substance and Sexual Activity  ?Alcohol Use No  ?   ?Social History  ? ?Substance and Sexual Activity  ?Drug Use No  ?  ?Social History  ? ?Socioeconomic History  ? Marital status: Single  ?  Spouse name: Not on file  ? Number of children: 1  ? Years of education: Not on file  ? Highest education level: GED or equivalent  ?Occupational History  ? Not on file  ?Tobacco Use  ? Smoking status: Never  ? Smokeless tobacco: Never  ?Vaping Use  ? Vaping Use: Unknown  ?Substance and Sexual Activity  ? Alcohol use: No  ? Drug use: No  ? Sexual activity: Not Currently  ?Other Topics Concern  ? Not on file  ?Social History Narrative  ? Marital status:  Single   Children: Daughter   Employment:  Unemployment. previous work for [employer].   Alcohol:  None   Drugs:  none   Exercise:  Regularly very   Religion:   Jewish  ? ?Social Determinants of Health  ? ?Financial Resource Strain: Not on file  ?Food Insecurity: Not on file  ?Transportation Needs: Not on file  ?Physical Activity: Not on file  ?Stress: Not on file  ?Social Connections: Not on file  ? ?Additional Social History:  ?  ?  ?  ?  ?  ?  ?  ?  ?  ?  ?  ? ?Sleep: Fair ? ?Appetite:  Fair ? ?Current Medications: ?Current Facility-Administered Medications  ?Medication Dose Route Frequency Provider Last Rate Last Admin  ? 0.9 %  sodium chloride infusion  500 mL Intravenous Once Clapacs, [name], MD      ? 0.9 %  sodium chloride infusion  500 mL Intravenous Once Clapacs, [name], MD      ? acetaminophen (TYLENOL) tablet 650 mg  650 mg Oral Once PRN Darrin Nipper, MD      ? Or  ? acetaminophen (TYLENOL) 160 MG/5ML solution 325-650 mg  325-650 mg Oral Q4H PRN Darrin Nipper, MD      ? amLODipine (NORVASC) tablet 10 mg  10 mg Oral Daily Clapacs, [name], MD   10 mg at 07/08/21 4098  ? diclofenac Sodium (VOLTAREN) 1 % topical gel 2 g  2 g Topical QID Clapacs, [name], MD   2 g at 07/06/21 2125  ? feeding  supplement (ENSURE ENLIVE / ENSURE PLUS) liquid 237 mL  237 mL Oral BID BM Clapacs, [name], MD   237 mL at 07/09/21 0947  ? gabapentin (NEURONTIN) capsule 400 mg  400 mg Oral TID Clapacs, [name], MD   400 mg at 07/09/21 8756  ? hydrOXYzine (ATARAX) tablet 50 mg  50 mg Oral Q6H PRN Clapacs, [name], MD   50 mg at 07/04/21 2111  ? ibuprofen (ADVIL) tablet 600 mg  600 mg Oral Q6H PRN Clapacs, [name], MD   600 mg at 06/27/21 0915  ? loperamide (IMODIUM) capsule 2 mg  2 mg Oral PRN Larita Fife, MD   2 mg at 07/03/21 1323  ? melatonin tablet 5 mg  5 mg Oral QHS Clapacs, [name], MD   5 mg at 07/08/21 2128  ? midazolam (VERSED) injection 2 mg  2 mg Intravenous Once Clapacs, [name], MD      ? mirtazapine (REMERON) tablet 45 mg  45 mg Oral QHS Clapacs, [name], MD   45 mg at 07/08/21 2127  ? multivitamin with minerals tablet 1 tablet  1 tablet Oral Daily Clapacs, [name], MD   1 tablet  at 07/09/21 4332  ? ondansetron (ZOFRAN) injection 4 mg  4 mg Intravenous Once PRN Darrin Nipper, MD      ? ondansetron (ZOFRAN-ODT) disintegrating tablet 4 mg  4 mg Oral Q4H PRN Clapacs, [name], MD      ? propranolol (INDERAL) tablet 10 mg  10 mg Oral TID Clapacs, [name], MD   10 mg at 07/09/21 9518  ? QUEtiapine (SEROQUEL) tablet 100 mg  100 mg Oral QHS Clapacs, [name], MD   100 mg at 07/08/21 2128  ? traZODone (DESYREL) tablet 100 mg  100 mg Oral QHS Clapacs, [name], MD   100 mg at 07/08/21 2128  ? traZODone (DESYREL) tablet 100 mg  100 mg Oral QHS PRN Clapacs, [name], MD   100 mg at 06/25/21 2209  ? venlafaxine XR (EFFEXOR-XR) 24 hr capsule 150 mg  150 mg Oral Q breakfast Clapacs, [name], MD   150 mg at 07/09/21 0819  ? Vitamin D (Ergocalciferol) (DRISDOL) capsule 50,000 Units  50,000 Units Oral Q7 days Clapacs, [name], MD   50,000 Units at 07/02/21 1819  ? ? ?Lab Results:  ?Results for orders placed or performed during the hospital encounter of 06/23/21 (from the past 48 hour(s))  ?Glucose, capillary     Status: None  ? Collection Time: 07/08/21  7:00 AM  ?Result Value Ref Range  ? Glucose-Capillary 84 70 - 99 mg/dL  ?  Comment: Glucose reference range applies only to samples taken after fasting for at least 8 hours.  ? ? ?Blood Alcohol level:  ?Lab Results  ?Component Value Date  ? ETH <10 06/07/2021  ? ETH <10 11/22/2020  ? ? ?Metabolic Disorder Labs: ?Lab Results  ?Component Value Date  ? HGBA1C 5.1 06/07/2021  ? MPG 99.67 06/07/2021  ? MPG 111.15 11/24/2020  ? ?No results found for: PROLACTIN ?Lab Results  ?Component Value Date  ? CHOL 197 06/07/2021  ? TRIG 149 06/07/2021  ? HDL 44 06/07/2021  ? CHOLHDL 4.5 06/07/2021  ? VLDL 30 06/07/2021  ? LDLCALC 123 (H) 06/07/2021  ? LDLCALC 148 (H) 11/24/2020  ? ? ?Physical Findings: ?AIMS:  , ,  ,  ,    ?CIWA:    ?COWS:    ? ?Musculoskeletal: ?Strength & Muscle Tone: within  normal limits ?Gait & Station: normal ?Patient leans: Right ? ?Psychiatric Specialty  Exam: ? ?Presentation  ?General Appearance: Appropriate for Environment; Bizarre; Disheveled; Fairly Groomed ? ?Eye Contact:Good ? ?Speech:Clear and Coherent; Normal Rate ? ?Speech Volume:Normal ? ?Handedness:Right ? ? ?Mood and Affect  ?Mood:Anxious; Depressed; Hopeless; Irritable ? ?Affect:Blunt; Depressed ? ? ?Thought Process  ?Thought Processes:Coherent; Linear ? ?Descriptions of Associations:Intact ? ?Orientation:Full (Time, Place and Person) ? ?Thought Content:Logical; WDL ? ?History of Schizophrenia/Schizoaffective disorder:No ? ?Duration of Psychotic Symptoms:N/A ? ?Hallucinations:No data recorded ?Ideas of Reference:None ? ?Suicidal Thoughts:No data recorded ?Homicidal Thoughts:No data recorded ? ?Sensorium  ?Memory:Immediate Fair; Recent Fair; Remote Fair ? ?Judgment:Fair ? ?Insight:Fair ? ? ?Executive Functions  ?Concentration:Fair ? ?Attention Span:Good ? ?Recall:Good ? ?[place] ? ?Language:Good ? ? ?Psychomotor Activity  ?Psychomotor Activity:No data recorded ? ?Assets  ?Assets:Communication Skills; Physical Health; Social Support ? ? ?Sleep  ?Sleep:No data recorded ? ? ?Physical Exam: ?Physical Exam ?Constitutional:   ?   Appearance: Normal appearance. He is normal weight.  ?HENT:  ?   Head: Normocephalic and atraumatic.  ?   Right Ear: Tympanic membrane normal.  ?   Left Ear: Tympanic membrane normal.  ?   Nose: Nose normal.  ?   Mouth/Throat:  ?   Mouth: Mucous membranes are moist.  ?   Pharynx: Oropharynx is clear.  ?Eyes:  ?   Extraocular Movements: Extraocular movements intact.  ?   Conjunctiva/sclera: Conjunctivae normal.  ?   Pupils: Pupils are equal, round, and reactive to light.  ?Cardiovascular:  ?   Rate and Rhythm: Normal rate and regular rhythm.  ?   Pulses: Normal pulses.  ?   Heart sounds: Normal heart sounds.  ?Pulmonary:  ?   Effort: Pulmonary effort is normal.  ?   Breath sounds: Normal breath sounds.  ?Abdominal:  ?   General: Abdomen is flat. Bowel sounds are normal.   ?   Palpations: Abdomen is soft.  ?Musculoskeletal:     ?   General: Normal range of motion.  ?   Cervical back: Normal range of motion and neck supple.  ?Skin: ?   General: Skin is warm.  ?Neurological:

## 2021-07-09 NOTE — Plan of Care (Signed)
Patient got up for medications and breakfast. alert and oriented x 4 and denying thoughts of self-harm. Denying hallucinations. Cooperative and active in the milieu. Has no sign of distress. Safety monitored as indicated.  ?

## 2021-07-09 NOTE — Progress Notes (Signed)
Patient has been sleeping and reusing his PM medications. ?

## 2021-07-09 NOTE — Progress Notes (Signed)
Patient alert and oriented x 4, affect is flat but brightens upon approach, no distress noted S/P ECT he denies pain no confusion noted, he was seen interacting appropriately with peers and staff and complaint with medication regimen, 15 minutes safety checks maintained will continue to closely monitor  ?

## 2021-07-09 NOTE — BH IP Treatment Plan (Signed)
Interdisciplinary Treatment and Diagnostic Plan Update ? ?07/09/2021 ?Time of Session: 9:15AM ?Casey Silva ?MRN: [id number] ? ?Principal Diagnosis: Severe recurrent major depression with psychotic features ([place]) ? ?Secondary Diagnoses: Principal Problem: ?  Severe recurrent major depression with psychotic features ([place]) ?Active Problems: ?  Primary insomnia ?  GAD (generalized anxiety disorder) ?  Essential hypertension ? ? ?Current Medications:  ?Current Facility-Administered Medications  ?Medication Dose Route Frequency Provider Last Rate Last Admin  ? 0.9 %  sodium chloride infusion  500 mL Intravenous Once Clapacs, [name], MD      ? 0.9 %  sodium chloride infusion  500 mL Intravenous Once Clapacs, [name], MD      ? acetaminophen (TYLENOL) tablet 650 mg  650 mg Oral Once PRN Darrin Nipper, MD      ? Or  ? acetaminophen (TYLENOL) 160 MG/5ML solution 325-650 mg  325-650 mg Oral Q4H PRN Darrin Nipper, MD      ? amLODipine (NORVASC) tablet 10 mg  10 mg Oral Daily Clapacs, [name], MD   10 mg at 07/08/21 7829  ? diclofenac Sodium (VOLTAREN) 1 % topical gel 2 g  2 g Topical QID Clapacs, [name], MD   2 g at 07/06/21 2125  ? feeding supplement (ENSURE ENLIVE / ENSURE PLUS) liquid 237 mL  237 mL Oral BID BM Clapacs, [name], MD   237 mL at 07/08/21 1708  ? gabapentin (NEURONTIN) capsule 400 mg  400 mg Oral TID Clapacs, [name], MD   400 mg at 07/09/21 5621  ? hydrOXYzine (ATARAX) tablet 50 mg  50 mg Oral Q6H PRN Clapacs, [name], MD   50 mg at 07/04/21 2111  ? ibuprofen (ADVIL) tablet 600 mg  600 mg Oral Q6H PRN Clapacs, [name], MD   600 mg at 06/27/21 0915  ? loperamide (IMODIUM) capsule 2 mg  2 mg Oral PRN Larita Fife, MD   2 mg at 07/03/21 1323  ? melatonin tablet 5 mg  5 mg Oral QHS Clapacs, [name], MD   5 mg at 07/08/21 2128  ? midazolam (VERSED) injection 2 mg  2 mg Intravenous Once Clapacs, [name], MD      ? mirtazapine (REMERON) tablet 45 mg  45 mg Oral QHS Clapacs, [name], MD   45 mg at 07/08/21 2127  ?  multivitamin with minerals tablet 1 tablet  1 tablet Oral Daily Clapacs, [name], MD   1 tablet at 07/09/21 3086  ? ondansetron (ZOFRAN) injection 4 mg  4 mg Intravenous Once PRN Darrin Nipper, MD      ? ondansetron (ZOFRAN-ODT) disintegrating tablet 4 mg  4 mg Oral Q4H PRN Clapacs, [name], MD      ? propranolol (INDERAL) tablet 10 mg  10 mg Oral TID Clapacs, [name], MD   10 mg at 07/09/21 5784  ? QUEtiapine (SEROQUEL) tablet 100 mg  100 mg Oral QHS Clapacs, [name], MD   100 mg at 07/08/21 2128  ? traZODone (DESYREL) tablet 100 mg  100 mg Oral QHS Clapacs, [name], MD   100 mg at 07/08/21 2128  ? traZODone (DESYREL) tablet 100 mg  100 mg Oral QHS PRN Clapacs, [name], MD   100 mg at 06/25/21 2209  ? venlafaxine XR (EFFEXOR-XR) 24 hr capsule 150 mg  150 mg Oral Q breakfast Clapacs, [name], MD   150 mg at 07/09/21 0819  ? Vitamin D (Ergocalciferol) (DRISDOL) capsule 50,000 Units  50,000 Units Oral  Q7 days Clapacs, [name], MD   50,000 Units at 07/02/21 1819  ? ?PTA Medications: ?Medications Prior to Admission  ?Medication Sig Dispense Refill Last Dose  ? amLODipine (NORVASC) 10 MG tablet Take 1 tablet (10 mg total) by mouth daily. 30 tablet 0 07/03/2021  ? gabapentin (NEURONTIN) 400 MG capsule Take 1 capsule (400 mg total) by mouth 3 (three) times daily. 90 capsule 0 07/03/2021  ? LORazepam (ATIVAN) 0.5 MG tablet Take 1 tablet (0.5 mg total) by mouth 2 (two) times daily as needed for anxiety. 30 tablet 0 07/03/2021  ? melatonin 5 MG TABS Take 1 tablet (5 mg total) by mouth at bedtime. 30 tablet 0 07/03/2021  ? mirtazapine (REMERON) 30 MG tablet Take 1 tablet (30 mg total) by mouth at bedtime. 30 tablet 3 07/03/2021  ? propranolol (INDERAL) 10 MG tablet Take 1 tablet (10 mg total) by mouth 3 (three) times daily. 90 tablet 0 07/03/2021  ? QUEtiapine (SEROQUEL) 100 MG tablet Take 1 tablet (100 mg total) by mouth at bedtime. 30 tablet 0 07/03/2021  ? QUEtiapine (SEROQUEL) 25 MG tablet Take 1 tablet (25 mg total) by mouth 2 (two)  times daily. 60 tablet 0 07/03/2021  ? traZODone (DESYREL) 100 MG tablet Take 1 tablet (100 mg total) by mouth at bedtime. 30 tablet 0 07/03/2021  ? venlafaxine XR (EFFEXOR-XR) 37.5 MG 24 hr capsule Take 3 capsules (112.5 mg total) by mouth daily with breakfast. 30 capsule 0 07/03/2021  ? Vitamin D, Ergocalciferol, (DRISDOL) 1.25 MG (50000 UNIT) CAPS capsule Take 1 capsule (50,000 Units total) by mouth every 7 (seven) days. 5 capsule 0 07/03/2021  ? ? ?Patient Stressors: Loss of connection with work, loss of sense of purpose   ?Other: loss of mental health   ? ?Patient Strengths: Ability for insight  ?Communication skills  ?General fund of knowledge  ?Motivation for treatment/growth  ? ?Treatment Modalities: Medication Management, Group therapy, Case management,  ?1 to 1 session with clinician, Psychoeducation, Recreational therapy. ? ? ?Physician Treatment Plan for Primary Diagnosis: Severe recurrent major depression with psychotic features ([place]) ?Long Term Goal(s): Improvement in symptoms so as ready for discharge  ? ?Short Term Goals: Ability to maintain clinical measurements within normal limits will improve ?Compliance with prescribed medications will improve ?Ability to verbalize feelings will improve ?Ability to disclose and discuss suicidal ideas ?Ability to demonstrate self-control will improve ? ?Medication Management: Evaluate patient's response, side effects, and tolerance of medication regimen. ? ?Therapeutic Interventions: 1 to 1 sessions, Unit Group sessions and Medication administration. ? ?Evaluation of Outcomes: Progressing ? ?Physician Treatment Plan for Secondary Diagnosis: Principal Problem: ?  Severe recurrent major depression with psychotic features ([place]) ?Active Problems: ?  Primary insomnia ?  GAD (generalized anxiety disorder) ?  Essential hypertension ? ?Long Term Goal(s): Improvement in symptoms so as ready for discharge  ? ?Short Term Goals: Ability to maintain clinical measurements within  normal limits will improve ?Compliance with prescribed medications will improve ?Ability to verbalize feelings will improve ?Ability to disclose and discuss suicidal ideas ?Ability to demonstrate self-control will improve    ? ?Medication Management: Evaluate patient's response, side effects, and tolerance of medication regimen. ? ?Therapeutic Interventions: 1 to 1 sessions, Unit Group sessions and Medication administration. ? ?Evaluation of Outcomes: Progressing ? ? ?RN Treatment Plan for Primary Diagnosis: Severe recurrent major depression with psychotic features ([place]) ?Long Term Goal(s): Knowledge of disease and therapeutic regimen to maintain health will improve ? ?Short Term Goals: Ability to remain  free from injury will improve, Ability to verbalize frustration and anger appropriately will improve, Ability to demonstrate self-control, Ability to participate in decision making will improve, Ability to verbalize feelings will improve, Ability to disclose and discuss suicidal ideas, Ability to identify and develop effective coping behaviors will improve, and Compliance with prescribed medications will improve ? ?Medication Management: RN will administer medications as ordered by provider, will assess and evaluate patient's response and provide education to patient for prescribed medication. RN will report any adverse and/or side effects to prescribing provider. ? ?Therapeutic Interventions: 1 on 1 counseling sessions, Psychoeducation, Medication administration, Evaluate responses to treatment, Monitor vital signs and CBGs as ordered, Perform/monitor CIWA, COWS, AIMS and Fall Risk screenings as ordered, Perform wound care treatments as ordered. ? ?Evaluation of Outcomes: Progressing ? ? ?LCSW Treatment Plan for Primary Diagnosis: Severe recurrent major depression with psychotic features ([place]) ?Long Term Goal(s): Safe transition to appropriate next level of care at discharge, Engage patient in therapeutic group  addressing interpersonal concerns. ? ?Short Term Goals: Engage patient in aftercare planning with referrals and resources, Increase social support, Increase ability to appropriately verbalize feelings, Increase emoti

## 2021-07-10 NOTE — Progress Notes (Signed)
[facility] MD Progress Note ? ?07/10/2021 9:58 AM ?Casey Silva  ?MRN:  [id number] ?Subjective:  Patient was mostly in bed throughout this shift but did not complain of anything. Took medications and meals as scheduled.   ?Patient reports feelings better this morning with some improvement in his depression and amnesia. He slept well last night and has been eating well.  ?Principal Problem: Severe recurrent major depression with psychotic features ([place]) ?Diagnosis: Principal Problem: ?  Severe recurrent major depression with psychotic features ([place]) ?Active Problems: ?  Primary insomnia ?  GAD (generalized anxiety disorder) ?  Essential hypertension ? ?Total Time spent with patient: 20 minutes ? ?Past Psychiatric History: MDD ? ?Past Medical History:  ?Past Medical History:  ?Diagnosis Date  ? Anxiety   ? Asthma   ? Depression   ? History reviewed. No pertinent surgical history. ?Family History:  ?Family History  ?Problem Relation Age of Onset  ? Bipolar disorder Mother   ? ?Family Psychiatric  History:  ?Social History:  ?Social History  ? ?Substance and Sexual Activity  ?Alcohol Use No  ?   ?Social History  ? ?Substance and Sexual Activity  ?Drug Use No  ?  ?Social History  ? ?Socioeconomic History  ? Marital status: Single  ?  Spouse name: Not on file  ? Number of children: 1  ? Years of education: Not on file  ? Highest education level: GED or equivalent  ?Occupational History  ? Not on file  ?Tobacco Use  ? Smoking status: Never  ? Smokeless tobacco: Never  ?Vaping Use  ? Vaping Use: Unknown  ?Substance and Sexual Activity  ? Alcohol use: No  ? Drug use: No  ? Sexual activity: Not Currently  ?Other Topics Concern  ? Not on file  ?Social History Narrative  ? Marital status:  Single   Children: Daughter   Employment:  Unemployment. previous work for [employer].   Alcohol:  None   Drugs:  none   Exercise:  Regularly very   Religion:  Jewish  ? ?Social Determinants of Health  ? ?Financial Resource Strain: Not on file   ?Food Insecurity: Not on file  ?Transportation Needs: Not on file  ?Physical Activity: Not on file  ?Stress: Not on file  ?Social Connections: Not on file  ? ?Additional Social History:  ?  ?  ?  ?  ?  ?  ?  ?  ?  ?  ?  ? ?Sleep: Good ? ?Appetite:  Good ? ?Current Medications: ?Current Facility-Administered Medications  ?Medication Dose Route Frequency Provider Last Rate Last Admin  ? 0.9 %  sodium chloride infusion  500 mL Intravenous Once Clapacs, [name], MD      ? 0.9 %  sodium chloride infusion  500 mL Intravenous Once Clapacs, [name], MD      ? acetaminophen (TYLENOL) tablet 650 mg  650 mg Oral Once PRN Darrin Nipper, MD      ? Or  ? acetaminophen (TYLENOL) 160 MG/5ML solution 325-650 mg  325-650 mg Oral Q4H PRN Darrin Nipper, MD      ? amLODipine (NORVASC) tablet 10 mg  10 mg Oral Daily Clapacs, [name], MD   10 mg at 07/10/21 5456  ? diclofenac Sodium (VOLTAREN) 1 % topical gel 2 g  2 g Topical QID Clapacs, [name], MD   2 g at 07/06/21 2125  ? feeding supplement (ENSURE ENLIVE / ENSURE PLUS) liquid 237 mL  237 mL Oral BID BM  Clapacs, [name], MD   237 mL at 07/09/21 1729  ? gabapentin (NEURONTIN) capsule 400 mg  400 mg Oral TID Clapacs, [name], MD   400 mg at 07/10/21 6440  ? hydrOXYzine (ATARAX) tablet 50 mg  50 mg Oral Q6H PRN Clapacs, [name], MD   50 mg at 07/04/21 2111  ? ibuprofen (ADVIL) tablet 600 mg  600 mg Oral Q6H PRN Clapacs, [name], MD   600 mg at 06/27/21 0915  ? loperamide (IMODIUM) capsule 2 mg  2 mg Oral PRN Larita Fife, MD   2 mg at 07/03/21 1323  ? melatonin tablet 5 mg  5 mg Oral QHS Clapacs, [name], MD   5 mg at 07/09/21 2126  ? midazolam (VERSED) injection 2 mg  2 mg Intravenous Once Clapacs, [name], MD      ? mirtazapine (REMERON) tablet 45 mg  45 mg Oral QHS Clapacs, [name], MD   45 mg at 07/09/21 2126  ? multivitamin with minerals tablet 1 tablet  1 tablet Oral Daily Clapacs, [name], MD   1 tablet at 07/10/21 3474  ? ondansetron (ZOFRAN) injection 4 mg  4 mg Intravenous Once PRN  Darrin Nipper, MD      ? ondansetron (ZOFRAN-ODT) disintegrating tablet 4 mg  4 mg Oral Q4H PRN Clapacs, [name], MD      ? propranolol (INDERAL) tablet 10 mg  10 mg Oral TID Clapacs, [name], MD   10 mg at 07/10/21 2595  ? QUEtiapine (SEROQUEL) tablet 100 mg  100 mg Oral QHS Clapacs, [name], MD   100 mg at 07/09/21 2126  ? traZODone (DESYREL) tablet 100 mg  100 mg Oral QHS Clapacs, [name], MD   100 mg at 07/09/21 2125  ? traZODone (DESYREL) tablet 100 mg  100 mg Oral QHS PRN Clapacs, [name], MD   100 mg at 06/25/21 2209  ? venlafaxine XR (EFFEXOR-XR) 24 hr capsule 150 mg  150 mg Oral Q breakfast Clapacs, [name], MD   150 mg at 07/10/21 6387  ? Vitamin D (Ergocalciferol) (DRISDOL) capsule 50,000 Units  50,000 Units Oral Q7 days Clapacs, [name], MD   50,000 Units at 07/09/21 1847  ? ? ?Lab Results: No results found for this or any previous visit (from the past 48 hour(s)). ? ?Blood Alcohol level:  ?Lab Results  ?Component Value Date  ? ETH <10 06/07/2021  ? ETH <10 11/22/2020  ? ? ?Metabolic Disorder Labs: ?Lab Results  ?Component Value Date  ? HGBA1C 5.1 06/07/2021  ? MPG 99.67 06/07/2021  ? MPG 111.15 11/24/2020  ? ?No results found for: PROLACTIN ?Lab Results  ?Component Value Date  ? CHOL 197 06/07/2021  ? TRIG 149 06/07/2021  ? HDL 44 06/07/2021  ? CHOLHDL 4.5 06/07/2021  ? VLDL 30 06/07/2021  ? LDLCALC 123 (H) 06/07/2021  ? LDLCALC 148 (H) 11/24/2020  ? ? ?Physical Findings: ?AIMS:  , ,  ,  ,    ?CIWA:    ?COWS:    ? ?Musculoskeletal: ?Strength & Muscle Tone: within normal limits ?Gait & Station: normal ?Patient leans: N/A ? ?Psychiatric Specialty Exam: ? ?Presentation  ?General Appearance: Appropriate for Environment; Bizarre; Disheveled; Fairly Groomed ? ?Eye Contact:Good ? ?Speech:Clear and Coherent; Normal Rate ? ?Speech Volume:Normal ? ?Handedness:Right ? ? ?Mood and Affect  ?Mood:Anxious; Depressed; Hopeless; Irritable ? ?Affect:Blunt; Depressed ? ? ?Thought Process  ?Thought Processes:Coherent;  Linear ? ?Descriptions of Associations:Intact ? ?Orientation:Full (Time, Place and  Person) ? ?Thought Content:Logical; WDL ? ?History of Schizophrenia/Schizoaffective disorder:No ? ?Duration of Psychotic Symptoms:N/A ? ?Hallucinations:No data recorded ?Ideas of Reference:None ? ?Suicidal Thoughts:No data recorded ?Homicidal Thoughts:No data recorded ? ?Sensorium  ?Memory:Immediate Fair; Recent Fair; Remote Fair ? ?Judgment:Fair ? ?Insight:Fair ? ? ?Executive Functions  ?Concentration:Fair ? ?Attention Span:Good ? ?Recall:Good ? ?[place] ? ?Language:Good ? ? ?Psychomotor Activity  ?Psychomotor Activity:No data recorded ? ?Assets  ?Assets:Communication Skills; Physical Health; Social Support ? ? ?Sleep  ?Sleep:No data recorded ? ? ?Physical Exam: ?Physical Exam ?ROS ?Blood pressure 105/79, pulse 71, temperature 98 ?F (36.7 ?C), temperature source Oral, resp. rate 18, height '5\' 6"'$  (1.676 m), weight 76.2 kg, SpO2 98 %. Body mass index is 27.12 kg/m?. ? ? ?Treatment Plan Summary: ?Daily contact with patient to assess and evaluate symptoms and progress in treatment, Medication management, and Plan : Continue current meds and treatment.  ? ?Jala Dundon Wynell Balloon ?07/10/2021, 9:58 AM ? ?

## 2021-07-10 NOTE — Group Note (Signed)
[facility] LCSW Group Therapy Note ? ? ?Group Date: 07/10/2021 ?Start Time: 1300 ?End Time: 1400 ? ? ?Type of Therapy and Topic: Group Therapy: Avoiding Self-Sabotaging and Enabling Behaviors ? ?Participation Level: Did Not Attend ? ?Mood: ? ?Description of Group:  ?In this group, patients will learn how to identify obstacles, self-sabotaging and enabling behaviors, as well as: what are they, why do we do them and what needs these behaviors meet. Discuss unhealthy relationships and how to have positive healthy boundaries with those that sabotage and enable. Explore aspects of self-sabotage and enabling in yourself and how to limit these self-destructive behaviors in everyday life. ? ? ?Therapeutic Goals: ?1. Patient will identify one obstacle that relates to self-sabotage and enabling behaviors ?2. Patient will identify one personal self-sabotaging or enabling behavior they did prior to admission ?3. Patient will state a plan to change the above identified behavior ?4. Patient will demonstrate ability to communicate their needs through discussion and/or role play.  ? ? ?Summary of Patient Progress: Due to limited staffing, group was not held on the unit.  ? ? ? ?Therapeutic Modalities:  ?Cognitive Behavioral Therapy ?Person-Centered Therapy ?Motivational Interviewing ? ? ? ?Kenna Gilbert Nashon Erbes, LCSWA ?

## 2021-07-10 NOTE — Progress Notes (Signed)
Patient refused his Voltaren gel, both this morning and afternoon, stating to this writer that he does not need it. ?

## 2021-07-10 NOTE — Plan of Care (Signed)
D- Patient alert and oriented. Patient presented in a pleasant mood on assessment reporting that he slept fair last night and had complaints to voice to this Probation officer. Patient endorsed suicidal thoughts on his self-inventory, however, when this writer asked him about it, he stated that the thoughts come and go, but he was not having any at this time. Patient also endorsed depression, anxiety, and hopelessness, rating them all a "6/10". Patient stated that his depression and anxiety is "hereditary". Patient denied SI, HI, AVH, and pain. Patient's goal for today is "getting up", in which he will "have a slow steady day", in order to achieve his goal. ? ?A- Scheduled medications administered to patient, per MD orders. Support and encouragement provided.  Routine safety checks conducted every 15 minutes.  Patient informed to notify staff with problems or concerns. ? ?R- No adverse drug reactions noted. Patient contracts for safety at this time. Patient compliant with medications and treatment plan. Patient receptive, calm, and cooperative. Patient remains safe at this time. ? ?Problem: Education: ?Goal: Utilization of techniques to improve thought processes will improve ?Outcome: Progressing ?Goal: Knowledge of the prescribed therapeutic regimen will improve ?Outcome: Progressing ?  ?Problem: Activity: ?Goal: Interest or engagement in leisure activities will improve ?Outcome: Progressing ?Goal: Imbalance in normal sleep/wake cycle will improve ?Outcome: Progressing ?  ?Problem: Coping: ?Goal: Coping ability will improve ?Description: PT IS PROGRESSING. ?Outcome: Progressing ?Goal: Will verbalize feelings ?Outcome: Progressing ?  ?Problem: Health Behavior/Discharge Planning: ?Goal: Ability to make decisions will improve ?Outcome: Progressing ?Goal: Compliance with therapeutic regimen will improve ?Outcome: Progressing ?  ?Problem: Role Relationship: ?Goal: Will demonstrate positive changes in social behaviors and  relationships ?Outcome: Progressing ?  ?Problem: Safety: ?Goal: Ability to disclose and discuss suicidal ideas will improve ?Outcome: Progressing ?Goal: Ability to identify and utilize support systems that promote safety will improve ?Outcome: Progressing ?  ?Problem: Self-Concept: ?Goal: Will verbalize positive feelings about self ?Outcome: Progressing ?Goal: Level of anxiety will decrease ?Outcome: Progressing ?  ?

## 2021-07-11 ENCOUNTER — Other Ambulatory Visit: Payer: Self-pay | Admitting: Psychiatry

## 2021-07-11 ENCOUNTER — Inpatient Hospital Stay: Payer: 59 | Admitting: Anesthesiology

## 2021-07-11 ENCOUNTER — Encounter: Payer: Self-pay | Admitting: Psychiatry

## 2021-07-11 ENCOUNTER — Ambulatory Visit: Payer: 59

## 2021-07-11 DIAGNOSIS — R413 Other amnesia: Secondary | ICD-10-CM | POA: Insufficient documentation

## 2021-07-11 DIAGNOSIS — F333 Major depressive disorder, recurrent, severe with psychotic symptoms: Secondary | ICD-10-CM | POA: Insufficient documentation

## 2021-07-11 LAB — GLUCOSE, CAPILLARY
Glucose-Capillary: 81 mg/dL (ref 70–99)
Glucose-Capillary: 91 mg/dL (ref 70–99)

## 2021-07-11 MED ORDER — SODIUM CHLORIDE 0.9 % IV SOLN: 500.0000 mL | Freq: Once | INTRAVENOUS | Status: AC

## 2021-07-11 MED ORDER — FENTANYL CITRATE (PF) 100 MCG/2ML IJ SOLN: 25.0000 ug | INTRAMUSCULAR | Status: DC | PRN

## 2021-07-11 MED ORDER — KETOROLAC TROMETHAMINE 30 MG/ML IJ SOLN: 30.0000 mg | Freq: Once | INTRAMUSCULAR | Status: AC

## 2021-07-11 MED ORDER — ONDANSETRON HCL 4 MG/2ML IJ SOLN: 4.0000 mg | Freq: Once | INTRAMUSCULAR | Status: AC

## 2021-07-11 MED ORDER — KETAMINE HCL 50 MG/5ML IJ SOSY
PREFILLED_SYRINGE | INTRAMUSCULAR | Status: AC
  Filled 2021-07-11: qty 5

## 2021-07-11 MED ORDER — LABETALOL HCL 5 MG/ML IV SOLN
INTRAVENOUS | Status: AC
  Filled 2021-07-11: qty 4

## 2021-07-11 MED ORDER — SUCCINYLCHOLINE CHLORIDE 200 MG/10ML IV SOSY
PREFILLED_SYRINGE | INTRAVENOUS | Status: DC | PRN
  Administered 2021-07-11: 100 mg via INTRAVENOUS

## 2021-07-11 MED ORDER — ONDANSETRON HCL 4 MG/2ML IJ SOLN: 4.0000 mg | Freq: Once | INTRAMUSCULAR | Status: DC | PRN

## 2021-07-11 MED ORDER — KETAMINE HCL 10 MG/ML IJ SOLN
INTRAMUSCULAR | Status: DC | PRN
  Administered 2021-07-11: 100 mg via INTRAVENOUS

## 2021-07-11 MED ORDER — KETAMINE HCL 50 MG/ML IJ SOLN
INTRAMUSCULAR | Status: AC
  Filled 2021-07-11: qty 10

## 2021-07-11 MED ORDER — KETOROLAC TROMETHAMINE 30 MG/ML IJ SOLN
INTRAMUSCULAR | Status: AC
  Administered 2021-07-11: 30 mg via INTRAVENOUS
  Filled 2021-07-11: qty 1

## 2021-07-11 MED ORDER — SUCCINYLCHOLINE CHLORIDE 200 MG/10ML IV SOSY
PREFILLED_SYRINGE | INTRAVENOUS | Status: AC
  Filled 2021-07-11: qty 20

## 2021-07-11 MED ORDER — ONDANSETRON HCL 4 MG/2ML IJ SOLN
INTRAMUSCULAR | Status: AC
  Administered 2021-07-11: 4 mg via INTRAVENOUS
  Filled 2021-07-11: qty 2

## 2021-07-11 NOTE — Plan of Care (Signed)
?  Problem: Group Participation Goal: STG - Patient will engage in groups without prompting or encouragement from LRT x3 group sessions within 5 recreation therapy group sessions Description: STG - Patient will engage in groups without prompting or encouragement from LRT x3 group sessions within 5 recreation therapy group sessions Outcome: Not Progressing   

## 2021-07-11 NOTE — Plan of Care (Signed)
D: Pt alert and oriented. Pt rates depression 6/10, hopelessness 6/10, and anxiety 6/10. Pt goal: "Concentrate." Pt reports energy level as normal and concentration as being poor. Pt reports sleep last night as being poor. Pt did not receive medications for sleep. Pt denies experiencing any pain at this time. Pt denies experiencing any SI/HI, or AVH at this time, however did endorse SI on self inventory. Pt verbally contracts for safety.  ? ?Prior to pt's arrival this writer did a linen change on pt's bed and provided pt with clean scrubs, new hygiene items and picked up pt's room. Pt was provided with a shower chair so they could shower upon arrival from ECT. This Probation officer had a discussion with pt about the importance of proper hygiene and unit expectations. Pt did reshower proper and it was noticeable that the pt washed properly. Pt was also provided with hygiene items for tomorrow.  ? ?When first back from ECT pt was A/O x 2. Post ECT assessment pt was oriented A/O x 4. Pt did not receive his propanolol d/t low BP, MD notified and aware of pt's VS not meeting parameters. ? ?Also pt continues to refuse his scheduled cream stating he only uses it every once in a while when he needs it, MD has notified.  ? ?A: Scheduled medications administered to pt, per MD orders. Support and encouragement provided. Frequent verbal contact made. Routine safety checks conducted q15 minutes.  ? ?R: No adverse drug reactions noted. Pt verbally contracts for safety at this time. Pt compliant with medications. Pt interacts minimally with others on the unit. Pt remains safe at this time. Will continue to monitor.  ? ?Problem: Education: ?Goal: Utilization of techniques to improve thought processes will improve ?Outcome: Not Progressing ?  ?Problem: Activity: ?Goal: Interest or engagement in leisure activities will improve ?Outcome: Not Progressing ?  ?

## 2021-07-11 NOTE — Anesthesia Preprocedure Evaluation (Signed)
Anesthesia Evaluation  ?Patient identified by MRN, date of birth, ID band ?Patient awake ? ? ? ?Reviewed: ?Allergy & Precautions, H&P , NPO status , Patient's Chart, lab work & pertinent test results, reviewed documented beta blocker date and time  ? ?Airway ?Mallampati: II ? ? ?Neck ROM: full ? ? ? Dental ? ?(+) Poor Dentition ?  ?Pulmonary ?asthma , COPD,  ?  ?Pulmonary exam normal ? ? ? ? ? ? ? Cardiovascular ?Exercise Tolerance: Poor ?hypertension, Normal cardiovascular exam ?Rhythm:regular Rate:Normal ? ? ?  ?Neuro/Psych ?PSYCHIATRIC DISORDERS Anxiety Depression Bipolar Disorder  Neuromuscular disease   ? GI/Hepatic ?negative GI ROS, Neg liver ROS,   ?Endo/Other  ?negative endocrine ROS ? Renal/GU ?negative Renal ROS  ?negative genitourinary ?  ?Musculoskeletal ? ? Abdominal ?  ?Peds ? Hematology ?negative hematology ROS ?(+)   ?Anesthesia Other Findings ?Past Medical History: ?No date: Anxiety ?No date: Asthma ?No date: Depression ?History reviewed. No pertinent surgical history. ?BMI   ? Body Mass Index: 27.12 kg/m?  ?  ? Reproductive/Obstetrics ?negative OB ROS ? ?  ? ? ? ? ? ? ? ? ? ? ? ? ? ?  ?  ? ? ? ? ? ? ? ? ?Anesthesia Physical ?Anesthesia Plan ? ?ASA: 3 ? ?Anesthesia Plan: General  ? ?Post-op Pain Management:   ? ?Induction:  ? ?PONV Risk Score and Plan:  ? ?Airway Management Planned:  ? ?Additional Equipment:  ? ?Intra-op Plan:  ? ?Post-operative Plan:  ? ?Informed Consent: I have reviewed the patients History and Physical, chart, labs and discussed the procedure including the risks, benefits and alternatives for the proposed anesthesia with the patient or authorized representative who has indicated his/her understanding and acceptance.  ? ? ? ?Dental Advisory Given ? ?Plan Discussed with: CRNA ? ?Anesthesia Plan Comments:   ? ? ? ? ? ? ?Anesthesia Quick Evaluation ? ?

## 2021-07-11 NOTE — Group Note (Signed)
[facility] LCSW Group Therapy Note ? ? ? ?Group Date: 07/11/2021 ?Start Time: 1330 ?End Time: 1430 ? ?Type of Therapy and Topic:  Group Therapy:  Overcoming Obstacles ? ?Participation Level:  [facility] PARTICIPATION LEVEL: Did Not Attend ? ?Mood: ? ?Description of Group:   ?In this group patients will be encouraged to explore what they see as obstacles to their own wellness and recovery. They will be guided to discuss their thoughts, feelings, and behaviors related to these obstacles. The group will process together ways to cope with barriers, with attention given to specific choices patients can make. Each patient will be challenged to identify changes they are motivated to make in order to overcome their obstacles. This group will be process-oriented, with patients participating in exploration of their own experiences as well as giving and receiving support and challenge from other group members. ? ?Therapeutic Goals: ?1. Patient will identify personal and current obstacles as they relate to admission. ?2. Patient will identify barriers that currently interfere with their wellness or overcoming obstacles.  ?3. Patient will identify feelings, thought process and behaviors related to these barriers. ?4. Patient will identify two changes they are willing to make to overcome these obstacles:  ? ? ?Summary of Patient Progress ? ? ?X ? ? ?Therapeutic Modalities:   ?Cognitive Behavioral Therapy ?Solution Focused Therapy ?Motivational Interviewing ?Relapse Prevention Therapy ? ? ?Rozann Lesches, LCSW ?

## 2021-07-11 NOTE — Procedures (Signed)
[facility] SERVICES ?Physician?s Interval Evaluation & Treatment Note ? ?Patient Identification: Casey Silva ?MRN:  [id number] ?Date of Evaluation:  07/11/2021 ?[place] #: 7 ? ?MADRS:  ? ?MMSE:  ? ?P.E. Findings: ? ?No change to physical exam ? ?Psychiatric Interval Note: ? ?Mood and affect are brighter although he continues to have significant memory impairment ? ?Subjective: ? ?Patient is a 66 y.o. male seen for evaluation for Electroconvulsive Therapy. ?Complaints of memory impairment ? ?Treatment Summary: ? ? '[x]'$   Right Unilateral             '[]'$  Bilateral ?  ?% Energy : 1.0 ms 100% ? ? Impedance: 1880 ohms ? ?Seizure Energy Index: 5400 ?V squared ? ?Postictal Suppression Index: 92% ? ?Seizure Concordance Index: 94%  ?Medications ? ?Pre Shock: Ketamine 100 mg succinylcholine 100 mg ? ?Post Shock:   ? ?Seizure Duration: 24 seconds EMG 35 seconds EEG ? ? ?Comments: ?Although we are past 6 he is really starting to show some improvement and with the ketamine had an adequate seizure.  We will continue the course for now. ? ?Lungs: ? ?'[x]'$   Clear to auscultation              ? ?'[]'$  Other:  ? ?Heart: ?  ? '[x]'$   Regular rhythm             '[]'$  irregular rhythm ? ? ? '[x]'$   Previous H&P reviewed, patient examined and there are NO CHANGES              ?  ? '[]'$   Previous H&P reviewed, patient examined and there are changes noted. ? ? ?Alethia Berthold, MD ?4/3/20234:51 PM ? ? ? ?

## 2021-07-11 NOTE — H&P (Signed)
Casey Silva is an 66 y.o. male.   ?Chief Complaint: Patient seen prior to ECT.  Complaining of short-term memory loss and confusion.  Mood however is better ?HPI: Extended episode of severe depression now receiving ECT ? ?Past Medical History:  ?Diagnosis Date  ? Anxiety   ? Asthma   ? Depression   ? ? ?History reviewed. No pertinent surgical history. ? ?Family History  ?Problem Relation Age of Onset  ? Bipolar disorder Mother   ? ?Social History:  reports that he has never smoked. He has never used smokeless tobacco. He reports that he does not drink alcohol and does not use drugs. ? ?Allergies:  ?Allergies  ?Allergen Reactions  ? Dust Mite Mixed Allergen Ext [Mite (D. Farinae)] Shortness Of Breath  ? Bee Pollen Other (See Comments)  ?  Watery Eyes  ? ? ?Medications Prior to Admission  ?Medication Sig Dispense Refill  ? amLODipine (NORVASC) 10 MG tablet Take 1 tablet (10 mg total) by mouth daily. 30 tablet 0  ? gabapentin (NEURONTIN) 400 MG capsule Take 1 capsule (400 mg total) by mouth 3 (three) times daily. 90 capsule 0  ? LORazepam (ATIVAN) 0.5 MG tablet Take 1 tablet (0.5 mg total) by mouth 2 (two) times daily as needed for anxiety. 30 tablet 0  ? melatonin 5 MG TABS Take 1 tablet (5 mg total) by mouth at bedtime. 30 tablet 0  ? mirtazapine (REMERON) 30 MG tablet Take 1 tablet (30 mg total) by mouth at bedtime. 30 tablet 3  ? propranolol (INDERAL) 10 MG tablet Take 1 tablet (10 mg total) by mouth 3 (three) times daily. 90 tablet 0  ? QUEtiapine (SEROQUEL) 100 MG tablet Take 1 tablet (100 mg total) by mouth at bedtime. 30 tablet 0  ? QUEtiapine (SEROQUEL) 25 MG tablet Take 1 tablet (25 mg total) by mouth 2 (two) times daily. 60 tablet 0  ? traZODone (DESYREL) 100 MG tablet Take 1 tablet (100 mg total) by mouth at bedtime. 30 tablet 0  ? venlafaxine XR (EFFEXOR-XR) 37.5 MG 24 hr capsule Take 3 capsules (112.5 mg total) by mouth daily with breakfast. 30 capsule 0  ? Vitamin D, Ergocalciferol, (DRISDOL) 1.25 MG  (50000 UNIT) CAPS capsule Take 1 capsule (50,000 Units total) by mouth every 7 (seven) days. 5 capsule 0  ? ? ?Results for orders placed or performed during the hospital encounter of 06/23/21 (from the past 48 hour(s))  ?Glucose, capillary     Status: None  ? Collection Time: 07/11/21  6:34 AM  ?Result Value Ref Range  ? Glucose-Capillary 81 70 - 99 mg/dL  ?  Comment: Glucose reference range applies only to samples taken after fasting for at least 8 hours.  ?Glucose, capillary     Status: None  ? Collection Time: 07/11/21 11:29 AM  ?Result Value Ref Range  ? Glucose-Capillary 91 70 - 99 mg/dL  ?  Comment: Glucose reference range applies only to samples taken after fasting for at least 8 hours.  ? ?No results found. ? ?Review of Systems  ?Constitutional: Negative.   ?HENT: Negative.    ?Eyes: Negative.   ?Respiratory: Negative.    ?Cardiovascular: Negative.   ?Gastrointestinal: Negative.   ?Musculoskeletal: Negative.   ?Skin: Negative.   ?Neurological: Negative.   ?Psychiatric/Behavioral:  Positive for decreased concentration and dysphoric mood. Negative for agitation. The patient is nervous/anxious.   ?All other systems reviewed and are negative. ? ?Blood pressure 113/84, pulse 78, temperature 98.6 ?F (37 ?C), temperature source  Oral, resp. rate 10, height '5\' 6"'$  (1.676 m), weight 76.2 kg, SpO2 98 %. ?Physical Exam ?Vitals reviewed.  ?Constitutional:   ?   Appearance: He is well-developed.  ?HENT:  ?   Head: Normocephalic and atraumatic.  ?Eyes:  ?   Conjunctiva/sclera: Conjunctivae normal.  ?   Pupils: Pupils are equal, round, and reactive to light.  ?Cardiovascular:  ?   Heart sounds: Normal heart sounds.  ?Pulmonary:  ?   Effort: Pulmonary effort is normal.  ?Abdominal:  ?   Palpations: Abdomen is soft.  ?Musculoskeletal:     ?   General: Normal range of motion.  ?   Cervical back: Normal range of motion.  ?Skin: ?   General: Skin is warm and dry.  ?Neurological:  ?   General: No focal deficit present.  ?   Mental  Status: He is alert.  ?Psychiatric:     ?   Attention and Perception: Attention normal.     ?   Mood and Affect: Mood normal.     ?   Speech: Speech normal.     ?   Behavior: Behavior is slowed.     ?   Thought Content: Thought content normal.     ?   Cognition and Memory: Cognition is impaired. Memory is impaired.  ?  ? ?Assessment/Plan ?Continue this index course as it appears we are continuing to get improvement with each session at this point ? ?Alethia Berthold, MD ?07/11/2021, 4:50 PM ? ? ? ?

## 2021-07-11 NOTE — Progress Notes (Signed)
[facility] MD Progress Note ? ?07/11/2021 4:57 PM ?Casey Silva  ?MRN:  [id number] ?Subjective: Follow-up for this 66 year old man with severe depression receiving ECT.  Patient seems to be showing slight improvement.  Mood is reported as better.  He has been out of his bed and active during the weekend.  He still is not bathing very much but he is smiling more and is not reporting acute suicidal ideation.  He had ECT today which was tolerated well. ?Principal Problem: Severe recurrent major depression with psychotic features ([place]) ?Diagnosis: Principal Problem: ?  Severe recurrent major depression with psychotic features ([place]) ?Active Problems: ?  Primary insomnia ?  GAD (generalized anxiety disorder) ?  Essential hypertension ? ?Total Time spent with patient: 30 minutes ? ?Past Psychiatric History: Past history of recurrent severe depression ? ?Past Medical History:  ?Past Medical History:  ?Diagnosis Date  ? Anxiety   ? Asthma   ? Depression   ? History reviewed. No pertinent surgical history. ?Family History:  ?Family History  ?Problem Relation Age of Onset  ? Bipolar disorder Mother   ? ?Family Psychiatric  History: See previous ?Social History:  ?Social History  ? ?Substance and Sexual Activity  ?Alcohol Use No  ?   ?Social History  ? ?Substance and Sexual Activity  ?Drug Use No  ?  ?Social History  ? ?Socioeconomic History  ? Marital status: Single  ?  Spouse name: Not on file  ? Number of children: 1  ? Years of education: Not on file  ? Highest education level: GED or equivalent  ?Occupational History  ? Not on file  ?Tobacco Use  ? Smoking status: Never  ? Smokeless tobacco: Never  ?Vaping Use  ? Vaping Use: Unknown  ?Substance and Sexual Activity  ? Alcohol use: No  ? Drug use: No  ? Sexual activity: Not Currently  ?Other Topics Concern  ? Not on file  ?Social History Narrative  ? Marital status:  Single   Children: Daughter   Employment:  Unemployment. previous work for [employer].   Alcohol:  None   Drugs:   none   Exercise:  Regularly very   Religion:  Jewish  ? ?Social Determinants of Health  ? ?Financial Resource Strain: Not on file  ?Food Insecurity: Not on file  ?Transportation Needs: Not on file  ?Physical Activity: Not on file  ?Stress: Not on file  ?Social Connections: Not on file  ? ?Additional Social History:  ?  ?  ?  ?  ?  ?  ?  ?  ?  ?  ?  ? ?Sleep: Fair ? ?Appetite:  Fair ? ?Current Medications: ?Current Facility-Administered Medications  ?Medication Dose Route Frequency Provider Last Rate Last Admin  ? 0.9 %  sodium chloride infusion  500 mL Intravenous Once Mileena Rothenberger T, MD      ? 0.9 %  sodium chloride infusion  500 mL Intravenous Once Sheenah Dimitroff, [name], MD      ? acetaminophen (TYLENOL) tablet 650 mg  650 mg Oral Once PRN Darrin Nipper, MD      ? Or  ? acetaminophen (TYLENOL) 160 MG/5ML solution 325-650 mg  325-650 mg Oral Q4H PRN Darrin Nipper, MD      ? amLODipine (NORVASC) tablet 10 mg  10 mg Oral Daily Dorean Daniello, [name], MD   10 mg at 07/11/21 0800  ? diclofenac Sodium (VOLTAREN) 1 % topical gel 2 g  2 g Topical QID Crystale Giannattasio, [name], MD  2 g at 07/06/21 2125  ? feeding supplement (ENSURE ENLIVE / ENSURE PLUS) liquid 237 mL  237 mL Oral BID BM Ivonna Kinnick T, MD   237 mL at 07/11/21 1457  ? fentaNYL (SUBLIMAZE) injection 25 mcg  25 mcg Intravenous Q5 min PRN Molli Barrows, MD      ? gabapentin (NEURONTIN) capsule 400 mg  400 mg Oral TID Nga Rabon, [name], MD   400 mg at 07/11/21 1641  ? hydrOXYzine (ATARAX) tablet 50 mg  50 mg Oral Q6H PRN Michaeljoseph Revolorio, [name], MD   50 mg at 07/04/21 2111  ? ibuprofen (ADVIL) tablet 600 mg  600 mg Oral Q6H PRN Caytlin Better, [name], MD   600 mg at 06/27/21 0915  ? loperamide (IMODIUM) capsule 2 mg  2 mg Oral PRN Larita Fife, MD   2 mg at 07/03/21 1323  ? melatonin tablet 5 mg  5 mg Oral QHS Lexine Jaspers, [name], MD   5 mg at 07/10/21 2100  ? midazolam (VERSED) injection 2 mg  2 mg Intravenous Once Nelia Rogoff T, MD      ? mirtazapine (REMERON) tablet 45 mg  45 mg Oral QHS  Makayla Confer, [name], MD   45 mg at 07/10/21 2100  ? multivitamin with minerals tablet 1 tablet  1 tablet Oral Daily Reginold Beale, [name], MD   1 tablet at 07/10/21 3710  ? ondansetron (ZOFRAN) injection 4 mg  4 mg Intravenous Once PRN Molli Barrows, MD      ? ondansetron (ZOFRAN-ODT) disintegrating tablet 4 mg  4 mg Oral Q4H PRN Tupac Jeffus, [name], MD      ? propranolol (INDERAL) tablet 10 mg  10 mg Oral TID Katerin Negrete, [name], MD   10 mg at 07/11/21 0800  ? QUEtiapine (SEROQUEL) tablet 100 mg  100 mg Oral QHS Nakina Spatz, [name], MD   100 mg at 07/10/21 2100  ? traZODone (DESYREL) tablet 100 mg  100 mg Oral QHS Maylyn Narvaiz, [name], MD   100 mg at 07/10/21 2100  ? traZODone (DESYREL) tablet 100 mg  100 mg Oral QHS PRN Toluwani Yadav, [name], MD   100 mg at 06/25/21 2209  ? venlafaxine XR (EFFEXOR-XR) 24 hr capsule 150 mg  150 mg Oral Q breakfast Jalon Blackwelder, [name], MD   150 mg at 07/11/21 0759  ? Vitamin D (Ergocalciferol) (DRISDOL) capsule 50,000 Units  50,000 Units Oral Q7 days Daveah Varone, [name], MD   50,000 Units at 07/09/21 1847  ? ? ?Lab Results:  ?Results for orders placed or performed during the hospital encounter of 06/23/21 (from the past 48 hour(s))  ?Glucose, capillary     Status: None  ? Collection Time: 07/11/21  6:34 AM  ?Result Value Ref Range  ? Glucose-Capillary 81 70 - 99 mg/dL  ?  Comment: Glucose reference range applies only to samples taken after fasting for at least 8 hours.  ?Glucose, capillary     Status: None  ? Collection Time: 07/11/21 11:29 AM  ?Result Value Ref Range  ? Glucose-Capillary 91 70 - 99 mg/dL  ?  Comment: Glucose reference range applies only to samples taken after fasting for at least 8 hours.  ? ? ?Blood Alcohol level:  ?Lab Results  ?Component Value Date  ? ETH <10 06/07/2021  ? ETH <10 11/22/2020  ? ? ?Metabolic Disorder Labs: ?Lab Results  ?Component Value Date  ? HGBA1C 5.1 06/07/2021  ? MPG 99.67 06/07/2021  ? MPG 111.15 11/24/2020  ? ?  No results found for: PROLACTIN ?Lab Results  ?Component Value Date   ? CHOL 197 06/07/2021  ? TRIG 149 06/07/2021  ? HDL 44 06/07/2021  ? CHOLHDL 4.5 06/07/2021  ? VLDL 30 06/07/2021  ? LDLCALC 123 (H) 06/07/2021  ? LDLCALC 148 (H) 11/24/2020  ? ? ?Physical Findings: ?AIMS:  , ,  ,  ,    ?CIWA:    ?COWS:    ? ?Musculoskeletal: ?Strength & Muscle Tone: within normal limits ?Gait & Station: normal ?Patient leans: N/A ? ?Psychiatric Specialty Exam: ? ?Presentation  ?General Appearance: Appropriate for Environment; Bizarre; Disheveled; Fairly Groomed ? ?Eye Contact:Good ? ?Speech:Clear and Coherent; Normal Rate ? ?Speech Volume:Normal ? ?Handedness:Right ? ? ?Mood and Affect  ?Mood:Anxious; Depressed; Hopeless; Irritable ? ?Affect:Blunt; Depressed ? ? ?Thought Process  ?Thought Processes:Coherent; Linear ? ?Descriptions of Associations:Intact ? ?Orientation:Full (Time, Place and Person) ? ?Thought Content:Logical; WDL ? ?History of Schizophrenia/Schizoaffective disorder:No ? ?Duration of Psychotic Symptoms:N/A ? ?Hallucinations:No data recorded ?Ideas of Reference:None ? ?Suicidal Thoughts:No data recorded ?Homicidal Thoughts:No data recorded ? ?Sensorium  ?Memory:Immediate Fair; Recent Fair; Remote Fair ? ?Judgment:Fair ? ?Insight:Fair ? ? ?Executive Functions  ?Concentration:Fair ? ?Attention Span:Good ? ?Recall:Good ? ?[place] ? ?Language:Good ? ? ?Psychomotor Activity  ?Psychomotor Activity:No data recorded ? ?Assets  ?Assets:Communication Skills; Physical Health; Social Support ? ? ?Sleep  ?Sleep:No data recorded ? ? ?Physical Exam: ?Physical Exam ?Vitals and nursing note reviewed.  ?Constitutional:   ?   Appearance: Normal appearance.  ?HENT:  ?   Head: Normocephalic and atraumatic.  ?   Mouth/Throat:  ?   Pharynx: Oropharynx is clear.  ?Eyes:  ?   Pupils: Pupils are equal, round, and reactive to light.  ?Cardiovascular:  ?   Rate and Rhythm: Normal rate and regular rhythm.  ?Pulmonary:  ?   Effort: Pulmonary effort is normal.  ?   Breath sounds: Normal breath  sounds.  ?Abdominal:  ?   General: Abdomen is flat.  ?   Palpations: Abdomen is soft.  ?Musculoskeletal:     ?   General: Normal range of motion.  ?Skin: ?   General: Skin is warm and dry.  ?Neurological:  ?

## 2021-07-11 NOTE — Progress Notes (Signed)
Recreation Therapy Notes ? ? ?Date: 07/11/2021 ? ?Time: 10:35am    ? ?Location: Court yard   ? ?Behavioral response: N/A ?  ?Intervention Topic: Leisure   ? ?Discussion/Intervention: ?Patient refused to attend group.  ? ?Clinical Observations/Feedback:  ?Patient refused to attend group.  ?  ?[name] LRT/CTRS ? ? ? ? ? ? ? ? ? ?Casey Silva ?07/11/2021 12:33 PM ?

## 2021-07-11 NOTE — Transfer of Care (Signed)
Immediate Anesthesia Transfer of Care Note ? ?Patient: Casey Silva ? ?Procedure(s) Performed: ECT TX ? ?Patient Location: PACU ? ?Anesthesia Type:General ? ?Level of Consciousness: drowsy and patient cooperative ? ?Airway & Oxygen Therapy: Patient Spontanous Breathing ? ?Post-op Assessment: Report given to RN and Post -op Vital signs reviewed and stable ? ?Post vital signs: Reviewed and stable ? ?Last Vitals:  ?Vitals Value Taken Time  ?BP 165/96 07/11/21 1340  ?Temp    ?Pulse 92 07/11/21 1343  ?Resp 17 07/11/21 1343  ?SpO2 93 % 07/11/21 1343  ?Vitals shown include unvalidated device data. ? ?Last Pain:  ?Vitals:  ? 07/11/21 1120  ?TempSrc:   ?PainSc: 0-No pain  ?   ? ?Patients Stated Pain Goal: 0 (07/07/21 0900) ? ?Complications: No notable events documented. ?

## 2021-07-11 NOTE — Anesthesia Postprocedure Evaluation (Signed)
Anesthesia Post Note ? ?Patient: Casey Silva ? ?Procedure(s) Performed: ECT TX ? ?Patient location during evaluation: PACU ?Anesthesia Type: General ?Level of consciousness: awake and alert ?Pain management: pain level controlled ?Vital Signs Assessment: post-procedure vital signs reviewed and stable ?Respiratory status: spontaneous breathing, nonlabored ventilation, respiratory function stable and patient connected to nasal cannula oxygen ?Cardiovascular status: blood pressure returned to baseline and stable ?Postop Assessment: no apparent nausea or vomiting ?Anesthetic complications: no ? ? ?No notable events documented. ? ? ?Last Vitals:  ?Vitals:  ? 07/11/21 1404 07/11/21 1405  ?BP:    ?Pulse: 74 75  ?Resp: 20 18  ?Temp:    ?SpO2: 96% 96%  ?  ?Last Pain:  ?Vitals:  ? 07/11/21 1340  ?TempSrc:   ?PainSc: 0-No pain  ? ? ?  ?  ?  ?  ?  ?  ? ?Arita Miss ? ? ? ? ?

## 2021-07-12 ENCOUNTER — Other Ambulatory Visit: Payer: Self-pay | Admitting: Psychiatry

## 2021-07-12 MED ORDER — SEVOFLURANE IN SOLN
RESPIRATORY_TRACT | Status: AC
  Filled 2021-07-12: qty 250

## 2021-07-12 NOTE — Progress Notes (Signed)
[facility] MD Progress Note ? ?07/12/2021 1:22 PM ?Casey Silva  ?MRN:  [id number] ?Subjective: Follow-up for 66 year old man with depression.  Patient seen and chart reviewed he is getting up out of bed more.  Interacting more.  Tells me that he is realizing now that emotionally he is better.  He finds it difficult in some ways to get used to it that he is not as depressed.  Memory problems still present.  Blood pressure under good control sometimes a little on the low side. ?Principal Problem: Severe recurrent major depression with psychotic features ([place]) ?Diagnosis: Principal Problem: ?  Severe recurrent major depression with psychotic features ([place]) ?Active Problems: ?  Primary insomnia ?  GAD (generalized anxiety disorder) ?  Essential hypertension ? ?Total Time spent with patient: 30 minutes ? ?Past Psychiatric History: Past history of longstanding depression finally responding to treatment it appears in getting some improvement ? ?Past Medical History:  ?Past Medical History:  ?Diagnosis Date  ? Anxiety   ? Asthma   ? Depression   ? History reviewed. No pertinent surgical history. ?Family History:  ?Family History  ?Problem Relation Age of Onset  ? Bipolar disorder Mother   ? ?Family Psychiatric  History: See previous ?Social History:  ?Social History  ? ?Substance and Sexual Activity  ?Alcohol Use No  ?   ?Social History  ? ?Substance and Sexual Activity  ?Drug Use No  ?  ?Social History  ? ?Socioeconomic History  ? Marital status: Single  ?  Spouse name: Not on file  ? Number of children: 1  ? Years of education: Not on file  ? Highest education level: GED or equivalent  ?Occupational History  ? Not on file  ?Tobacco Use  ? Smoking status: Never  ? Smokeless tobacco: Never  ?Vaping Use  ? Vaping Use: Unknown  ?Substance and Sexual Activity  ? Alcohol use: No  ? Drug use: No  ? Sexual activity: Not Currently  ?Other Topics Concern  ? Not on file  ?Social History Narrative  ? Marital status:  Single   Children:  Daughter   Employment:  Unemployment. previous work for [employer].   Alcohol:  None   Drugs:  none   Exercise:  Regularly very   Religion:  Jewish  ? ?Social Determinants of Health  ? ?Financial Resource Strain: Not on file  ?Food Insecurity: Not on file  ?Transportation Needs: Not on file  ?Physical Activity: Not on file  ?Stress: Not on file  ?Social Connections: Not on file  ? ?Additional Social History:  ?  ?  ?  ?  ?  ?  ?  ?  ?  ?  ?  ? ?Sleep: Fair ? ?Appetite:  Fair ? ?Current Medications: ?Current Facility-Administered Medications  ?Medication Dose Route Frequency Provider Last Rate Last Admin  ? 0.9 %  sodium chloride infusion  500 mL Intravenous Once Erinn Huskins T, MD      ? 0.9 %  sodium chloride infusion  500 mL Intravenous Once Jarita Raval, [name], MD      ? acetaminophen (TYLENOL) tablet 650 mg  650 mg Oral Once PRN Darrin Nipper, MD      ? Or  ? acetaminophen (TYLENOL) 160 MG/5ML solution 325-650 mg  325-650 mg Oral Q4H PRN Darrin Nipper, MD      ? amLODipine (NORVASC) tablet 10 mg  10 mg Oral Daily Genifer Lazenby, [name], MD   10 mg at 07/12/21 0800  ? diclofenac Sodium (VOLTAREN)  1 % topical gel 2 g  2 g Topical QID Clydette Privitera, [name], MD   2 g at 07/06/21 2125  ? feeding supplement (ENSURE ENLIVE / ENSURE PLUS) liquid 237 mL  237 mL Oral BID BM Zykia Walla, [name], MD   237 mL at 07/12/21 0943  ? fentaNYL (SUBLIMAZE) injection 25 mcg  25 mcg Intravenous Q5 min PRN Molli Barrows, MD      ? gabapentin (NEURONTIN) capsule 400 mg  400 mg Oral TID Anabelen Kaminsky, [name], MD   400 mg at 07/12/21 1223  ? hydrOXYzine (ATARAX) tablet 50 mg  50 mg Oral Q6H PRN Maddux Vanscyoc, [name], MD   50 mg at 07/04/21 2111  ? ibuprofen (ADVIL) tablet 600 mg  600 mg Oral Q6H PRN Lamarr Feenstra, [name], MD   600 mg at 06/27/21 0915  ? loperamide (IMODIUM) capsule 2 mg  2 mg Oral PRN Larita Fife, MD   2 mg at 07/03/21 1323  ? melatonin tablet 5 mg  5 mg Oral QHS Lavona Norsworthy, [name], MD   5 mg at 07/11/21 2046  ? midazolam (VERSED) injection 2 mg  2  mg Intravenous Once Kelsey Durflinger T, MD      ? mirtazapine (REMERON) tablet 45 mg  45 mg Oral QHS Beatris Belen, [name], MD   45 mg at 07/11/21 2046  ? multivitamin with minerals tablet 1 tablet  1 tablet Oral Daily Skyler Carel, [name], MD   1 tablet at 07/12/21 0800  ? ondansetron (ZOFRAN) injection 4 mg  4 mg Intravenous Once PRN Molli Barrows, MD      ? ondansetron (ZOFRAN-ODT) disintegrating tablet 4 mg  4 mg Oral Q4H PRN Yakelin Grenier, [name], MD      ? propranolol (INDERAL) tablet 10 mg  10 mg Oral TID Hildur Bayer, [name], MD   10 mg at 07/12/21 1315  ? QUEtiapine (SEROQUEL) tablet 100 mg  100 mg Oral QHS Claudean Leavelle, [name], MD   100 mg at 07/11/21 2046  ? traZODone (DESYREL) tablet 100 mg  100 mg Oral QHS Keiron Iodice, [name], MD   100 mg at 07/11/21 2045  ? traZODone (DESYREL) tablet 100 mg  100 mg Oral QHS PRN Jolene Guyett, [name], MD   100 mg at 06/25/21 2209  ? venlafaxine XR (EFFEXOR-XR) 24 hr capsule 150 mg  150 mg Oral Q breakfast Khristi Schiller T, MD   150 mg at 07/12/21 0800  ? Vitamin D (Ergocalciferol) (DRISDOL) capsule 50,000 Units  50,000 Units Oral Q7 days Shanyla Marconi, [name], MD   50,000 Units at 07/09/21 1847  ? ? ?Lab Results:  ?Results for orders placed or performed during the hospital encounter of 06/23/21 (from the past 48 hour(s))  ?Glucose, capillary     Status: None  ? Collection Time: 07/11/21  6:34 AM  ?Result Value Ref Range  ? Glucose-Capillary 81 70 - 99 mg/dL  ?  Comment: Glucose reference range applies only to samples taken after fasting for at least 8 hours.  ?Glucose, capillary     Status: None  ? Collection Time: 07/11/21 11:29 AM  ?Result Value Ref Range  ? Glucose-Capillary 91 70 - 99 mg/dL  ?  Comment: Glucose reference range applies only to samples taken after fasting for at least 8 hours.  ? ? ?Blood Alcohol level:  ?Lab Results  ?Component Value Date  ? ETH <10 06/07/2021  ? ETH <10 11/22/2020  ? ? ?Metabolic Disorder Labs: ?Lab Results  ?Component Value Date  ?  HGBA1C 5.1 06/07/2021  ? MPG 99.67 06/07/2021   ? MPG 111.15 11/24/2020  ? ?No results found for: PROLACTIN ?Lab Results  ?Component Value Date  ? CHOL 197 06/07/2021  ? TRIG 149 06/07/2021  ? HDL 44 06/07/2021  ? CHOLHDL 4.5 06/07/2021  ? VLDL 30 06/07/2021  ? LDLCALC 123 (H) 06/07/2021  ? LDLCALC 148 (H) 11/24/2020  ? ? ?Physical Findings: ?AIMS:  , ,  ,  ,    ?CIWA:    ?COWS:    ? ?Musculoskeletal: ?Strength & Muscle Tone: within normal limits ?Gait & Station: normal ?Patient leans: N/A ? ?Psychiatric Specialty Exam: ? ?Presentation  ?General Appearance: Appropriate for Environment; Bizarre; Disheveled; Fairly Groomed ? ?Eye Contact:Good ? ?Speech:Clear and Coherent; Normal Rate ? ?Speech Volume:Normal ? ?Handedness:Right ? ? ?Mood and Affect  ?Mood:Anxious; Depressed; Hopeless; Irritable ? ?Affect:Blunt; Depressed ? ? ?Thought Process  ?Thought Processes:Coherent; Linear ? ?Descriptions of Associations:Intact ? ?Orientation:Full (Time, Place and Person) ? ?Thought Content:Logical; WDL ? ?History of Schizophrenia/Schizoaffective disorder:No ? ?Duration of Psychotic Symptoms:N/A ? ?Hallucinations:No data recorded ?Ideas of Reference:None ? ?Suicidal Thoughts:No data recorded ?Homicidal Thoughts:No data recorded ? ?Sensorium  ?Memory:Immediate Fair; Recent Fair; Remote Fair ? ?Judgment:Fair ? ?Insight:Fair ? ? ?Executive Functions  ?Concentration:Fair ? ?Attention Span:Good ? ?Recall:Good ? ?[place] ? ?Language:Good ? ? ?Psychomotor Activity  ?Psychomotor Activity:No data recorded ? ?Assets  ?Assets:Communication Skills; Physical Health; Social Support ? ? ?Sleep  ?Sleep:No data recorded ? ? ?Physical Exam: ?Physical Exam ?Vitals and nursing note reviewed.  ?Constitutional:   ?   Appearance: Normal appearance.  ?HENT:  ?   Head: Normocephalic and atraumatic.  ?   Mouth/Throat:  ?   Pharynx: Oropharynx is clear.  ?Eyes:  ?   Pupils: Pupils are equal, round, and reactive to light.  ?Cardiovascular:  ?   Rate and Rhythm: Normal rate and regular  rhythm.  ?Pulmonary:  ?   Effort: Pulmonary effort is normal.  ?   Breath sounds: Normal breath sounds.  ?Abdominal:  ?   General: Abdomen is flat.  ?   Palpations: Abdomen is soft.  ?Musculoskeletal:     ?

## 2021-07-12 NOTE — Group Note (Deleted)
[facility] LCSW Group Therapy Note ? ? ?Group Date: 07/12/2021 ?Start Time: 1300 ?End Time: 1400 ? ?Type of Therapy/Topic:  Group Therapy:  Feelings about Diagnosis ? ?Participation Level:  {[facility] PARTICIPATION LEVEL:22264}  ? ?Mood: *** ? ? ?Description of Group:   ? This group will allow patients to explore their thoughts and feelings about diagnoses they have received. Patients will be guided to explore their level of understanding and acceptance of these diagnoses. Facilitator will encourage patients to process their thoughts and feelings about the reactions of others to their diagnosis, and will guide patients in identifying ways to discuss their diagnosis with significant others in their lives. This group will be process-oriented, with patients participating in exploration of their own experiences as well as giving and receiving support and challenge from other group members. ? ? ?Therapeutic Goals: ?1. Patient will demonstrate understanding of diagnosis as evidence by identifying two or more symptoms of the disorder:  ?2. Patient will be able to express two feelings regarding the diagnosis ?3. Patient will demonstrate ability to communicate their needs through discussion and/or role plays ? ?Summary of Patient Progress: ? ? ? ?*** ? ? ? ?Therapeutic Modalities:   ?Cognitive Behavioral Therapy ?Brief Therapy ?Feelings Identification  ? ? ?Rozann Lesches, LCSW ?

## 2021-07-12 NOTE — Progress Notes (Signed)
D: Patient resting in bed this evening. Calm and cooperative. Denies SI/HI/AVH and pain. Reports depression 6/10 and anxiety 5/10. ? ?A: Scheduled medications administered to pt, per MD orders. Support and encouragement provided.  ? ?R: No adverse drug reactions noted. Pt verbally contracts for safety at this time. Pt remains safe at this time. Will continue to monitor.    ?

## 2021-07-12 NOTE — Plan of Care (Signed)
D: Pt alert and oriented. Pt rates depression 6/10, hopelessness 6/10, and anxiety 6/10. Pt goal: "Getting through the day." Pt reports energy level as low and concentration as being poor. Pt reports sleep last night as being fair. Pt did not receive medications for sleep. Pt denies experiencing any pain at this time. Pt denies experiencing any HI, or AVH at this time, however endorses SI w/o plan while here and verbally contracts for safety.  ? ?Pt attended all groups today, went outside with others and has been out of his room all day. Pt's hygiene has improved greatly. Pt just needs coaching and guidance when it comes to his hygiene and staying out of his room. ? ?A: Scheduled medications administered to pt, per MD orders. Support and encouragement provided. Frequent verbal contact made. Routine safety checks conducted q15 minutes.  ? ?R: No adverse drug reactions noted. Pt verbally contracts for safety at this time. Pt compliant with medications and treatment plan. Pt interacts well with others on the unit. Pt remains safe at this time. Will continue to monitor.  ? ?Problem: Education: ?Goal: Knowledge of the prescribed therapeutic regimen will improve ?Outcome: Progressing ?  ?Problem: Activity: ?Goal: Interest or engagement in leisure activities will improve ?Outcome: Progressing ?  ?

## 2021-07-12 NOTE — Group Note (Signed)
[facility] LCSW Group Therapy Note ? ? ?Group Date: 07/12/2021 ?Start Time: 1300 ?End Time: 1400 ? ?Type of Therapy/Topic:  Group Therapy:  Feelings about Diagnosis ? ?Participation Level:  Minimal  ? ?Mood: blunted  ? ? ?Description of Group:   ? This group will allow patients to explore their thoughts and feelings about diagnoses they have received. Patients will be guided to explore their level of understanding and acceptance of these diagnoses. Facilitator will encourage patients to process their thoughts and feelings about the reactions of others to their diagnosis, and will guide patients in identifying ways to discuss their diagnosis with significant others in their lives. This group will be process-oriented, with patients participating in exploration of their own experiences as well as giving and receiving support and challenge from other group members. ? ? ?Therapeutic Goals: ?1. Patient will demonstrate understanding of diagnosis as evidence by identifying two or more symptoms of the disorder:  ?2. Patient will be able to express two feelings regarding the diagnosis ?3. Patient will demonstrate ability to communicate their needs through discussion and/or role plays ? ?Summary of Patient Progress: ?Patient was present for the entirety of group session. Patient participated in opening and closing remarks. However, patient did not contribute at all to the topic of discussion despite encouraged participation. Patient appeared as though he was experiencing clouded cognition as evidence by poor eye contact with others in the group and blank face.  ? ? ? ?Therapeutic Modalities:   ?Cognitive Behavioral Therapy ?Brief Therapy ?Feelings Identification  ? ? ?Durenda Hurt, LCSWA ?

## 2021-07-12 NOTE — Plan of Care (Signed)
?  Problem: Education: ?Goal: Utilization of techniques to improve thought processes will improve ?Outcome: Progressing ?Goal: Knowledge of the prescribed therapeutic regimen will improve ?Outcome: Progressing ?  ?Problem: Activity: ?Goal: Interest or engagement in leisure activities will improve ?Outcome: Progressing ?Goal: Imbalance in normal sleep/wake cycle will improve ?Outcome: Progressing ?  ?Problem: Coping: ?Goal: Coping ability will improve ?Description: PT IS PROGRESSING. ?Outcome: Progressing ?Goal: Will verbalize feelings ?Outcome: Progressing ?  ?Problem: Health Behavior/Discharge Planning: ?Goal: Ability to make decisions will improve ?Outcome: Progressing ?Goal: Compliance with therapeutic regimen will improve ?Outcome: Progressing ?  ?Problem: Role Relationship: ?Goal: Will demonstrate positive changes in social behaviors and relationships ?Outcome: Progressing ?  ?Problem: Safety: ?Goal: Ability to disclose and discuss suicidal ideas will improve ?Outcome: Progressing ?Goal: Ability to identify and utilize support systems that promote safety will improve ?Outcome: Progressing ?  ?Problem: Self-Concept: ?Goal: Will verbalize positive feelings about self ?Outcome: Progressing ?Goal: Level of anxiety will decrease ?Outcome: Progressing ?  ?

## 2021-07-12 NOTE — Progress Notes (Signed)
Recreation Therapy Notes ? ? ?Date: 07/12/2021 ? ?Time: 10:20 am   ? ?Location: Craft room   ? ?Behavioral response: Appropriate ? ?Intervention Topic: Time Management   ? ?Discussion/Intervention:  ?Group content today was focused on time management. The group defined time management and identified healthy ways to manage time. Individuals expressed how much of the 24 hours they use in a day. Patients expressed how much time they use just for themselves personally. The group expressed how they have managed their time in the past. Individuals participated in the intervention ?Managing Life? where they had a chance to see how much of the 24 hours they use and where it goes. ?Clinical Observations/Feedback: ?[name] and defined time management as management of time. He stated that he normally manages his time by using devices like a phone or watch to keep up with time. Individual was social with peers and staff while participating in the intervention.    ?Casey Silva LRT/CTRS  ? ? ? ? ? ? ? ?Casey Silva ?07/12/2021 11:43 AM ?

## 2021-07-13 ENCOUNTER — Inpatient Hospital Stay: Payer: 59 | Admitting: Certified Registered Nurse Anesthetist

## 2021-07-13 ENCOUNTER — Ambulatory Visit: Payer: 59

## 2021-07-13 ENCOUNTER — Encounter: Payer: Self-pay | Admitting: Psychiatry

## 2021-07-13 LAB — GLUCOSE, CAPILLARY: Glucose-Capillary: 87 mg/dL (ref 70–99)

## 2021-07-13 MED ORDER — GLYCOPYRROLATE 0.2 MG/ML IJ SOLN
INTRAMUSCULAR | Status: AC
  Administered 2021-07-13: 0.1 mg via INTRAVENOUS
  Filled 2021-07-13: qty 1

## 2021-07-13 MED ORDER — KETAMINE HCL 50 MG/5ML IJ SOSY
PREFILLED_SYRINGE | INTRAMUSCULAR | Status: AC
Start: 2021-07-13 — End: ?
  Filled 2021-07-13: qty 5

## 2021-07-13 MED ORDER — LABETALOL HCL 5 MG/ML IV SOLN
INTRAVENOUS | Status: DC | PRN
  Administered 2021-07-13: 10 mg via INTRAVENOUS

## 2021-07-13 MED ORDER — ONDANSETRON HCL 4 MG/2ML IJ SOLN: 4.0000 mg | Freq: Once | INTRAMUSCULAR | Status: DC | PRN

## 2021-07-13 MED ORDER — ONDANSETRON HCL 4 MG/2ML IJ SOLN
4.0000 mg | Freq: Once | INTRAMUSCULAR | Status: AC
Start: 2021-07-13 — End: 2021-07-13

## 2021-07-13 MED ORDER — KETOROLAC TROMETHAMINE 30 MG/ML IJ SOLN: 30.0000 mg | Freq: Once | INTRAMUSCULAR | Status: AC

## 2021-07-13 MED ORDER — SODIUM CHLORIDE 0.9 % IV SOLN
500.0000 mL | Freq: Once | INTRAVENOUS | Status: AC
  Administered 2021-07-13: 500 mL via INTRAVENOUS

## 2021-07-13 MED ORDER — KETAMINE HCL 10 MG/ML IJ SOLN
INTRAMUSCULAR | Status: DC | PRN
  Administered 2021-07-13: 100 mg via INTRAVENOUS

## 2021-07-13 MED ORDER — LABETALOL HCL 5 MG/ML IV SOLN
INTRAVENOUS | Status: AC
  Filled 2021-07-13: qty 4

## 2021-07-13 MED ORDER — KETOROLAC TROMETHAMINE 30 MG/ML IJ SOLN
INTRAMUSCULAR | Status: AC
  Administered 2021-07-13: 30 mg via INTRAVENOUS
  Filled 2021-07-13: qty 1

## 2021-07-13 MED ORDER — SODIUM CHLORIDE 0.9 % IV SOLN: INTRAVENOUS | Status: DC | PRN

## 2021-07-13 MED ORDER — SUCCINYLCHOLINE CHLORIDE 200 MG/10ML IV SOSY
PREFILLED_SYRINGE | INTRAVENOUS | Status: DC | PRN
  Administered 2021-07-13: 100 mg via INTRAVENOUS

## 2021-07-13 MED ORDER — ONDANSETRON HCL 4 MG/2ML IJ SOLN
INTRAMUSCULAR | Status: AC
Start: 2021-07-13 — End: 2021-07-13
  Administered 2021-07-13: 4 mg via INTRAVENOUS
  Filled 2021-07-13: qty 2

## 2021-07-13 MED ORDER — GLYCOPYRROLATE 0.2 MG/ML IJ SOLN: 0.1000 mg | Freq: Once | INTRAMUSCULAR | Status: AC

## 2021-07-13 MED ORDER — SUCCINYLCHOLINE CHLORIDE 200 MG/10ML IV SOSY
PREFILLED_SYRINGE | INTRAVENOUS | Status: AC
  Filled 2021-07-13: qty 10

## 2021-07-13 NOTE — Progress Notes (Signed)
[facility] MD Progress Note ? ?07/13/2021 11:03 AM ?[name]  ?MRN:  [id number] ?Subjective: Follow-up for this patient with depression receiving ECT.  Patient was awake but in bed today.  He was pleasant and able to engage in appropriate conversation.  Still will occasionally say he has thoughts of suicide but with no plan or intent.  Overall continues to sense that his mood is getting better ?Principal Problem: Severe recurrent major depression with psychotic features ([place]) ?Diagnosis: Principal Problem: ?  Severe recurrent major depression with psychotic features ([place]) ?Active Problems: ?  Primary insomnia ?  GAD (generalized anxiety disorder) ?  Essential hypertension ? ?Total Time spent with patient: 30 minutes ? ?Past Psychiatric History: Past history of recurrent depression ? ?Past Medical History:  ?Past Medical History:  ?Diagnosis Date  ? Anxiety   ? Asthma   ? Depression   ? History reviewed. No pertinent surgical history. ?Family History:  ?Family History  ?Problem Relation Age of Onset  ? Bipolar disorder Mother   ? ?Family Psychiatric  History: See previous ?Social History:  ?Social History  ? ?Substance and Sexual Activity  ?Alcohol Use No  ?   ?Social History  ? ?Substance and Sexual Activity  ?Drug Use No  ?  ?Social History  ? ?Socioeconomic History  ? Marital status: Single  ?  Spouse name: Not on file  ? Number of children: 1  ? Years of education: Not on file  ? Highest education level: GED or equivalent  ?Occupational History  ? Not on file  ?Tobacco Use  ? Smoking status: Never  ? Smokeless tobacco: Never  ?Vaping Use  ? Vaping Use: Unknown  ?Substance and Sexual Activity  ? Alcohol use: No  ? Drug use: No  ? Sexual activity: Not Currently  ?Other Topics Concern  ? Not on file  ?Social History Narrative  ? Marital status:  Single   Children: Daughter   Employment:  Unemployment. previous work for [employer].   Alcohol:  None   Drugs:  none   Exercise:  Regularly very   Religion:  Jewish   ? ?Social Determinants of Health  ? ?Financial Resource Strain: Not on file  ?Food Insecurity: Not on file  ?Transportation Needs: Not on file  ?Physical Activity: Not on file  ?Stress: Not on file  ?Social Connections: Not on file  ? ?Additional Social History:  ?  ?  ?  ?  ?  ?  ?  ?  ?  ?  ?  ? ?Sleep: Fair ? ?Appetite:  Fair ? ?Current Medications: ?Current Facility-Administered Medications  ?Medication Dose Route Frequency Provider Last Rate Last Admin  ? 0.9 %  sodium chloride infusion  500 mL Intravenous Once Adreana Coull T, MD      ? 0.9 %  sodium chloride infusion  500 mL Intravenous Once Ariyonna Twichell, [name], MD      ? acetaminophen (TYLENOL) tablet 650 mg  650 mg Oral Once PRN Darrin Nipper, MD      ? Or  ? acetaminophen (TYLENOL) 160 MG/5ML solution 325-650 mg  325-650 mg Oral Q4H PRN Darrin Nipper, MD      ? amLODipine (NORVASC) tablet 10 mg  10 mg Oral Daily Semira Stoltzfus, [name], MD   10 mg at 07/13/21 1517  ? diclofenac Sodium (VOLTAREN) 1 % topical gel 2 g  2 g Topical QID Lovely Kerins, [name], MD   2 g at 07/06/21 2125  ? feeding supplement (ENSURE ENLIVE /  ENSURE PLUS) liquid 237 mL  237 mL Oral BID BM Malikye Reppond T, MD   237 mL at 07/12/21 1500  ? fentaNYL (SUBLIMAZE) injection 25 mcg  25 mcg Intravenous Q5 min PRN Molli Barrows, MD      ? gabapentin (NEURONTIN) capsule 400 mg  400 mg Oral TID Makena Murdock, [name], MD   400 mg at 07/13/21 1324  ? hydrOXYzine (ATARAX) tablet 50 mg  50 mg Oral Q6H PRN Sherley Mckenney, [name], MD   50 mg at 07/12/21 2050  ? ibuprofen (ADVIL) tablet 600 mg  600 mg Oral Q6H PRN Lakynn Halvorsen, [name], MD   600 mg at 06/27/21 0915  ? loperamide (IMODIUM) capsule 2 mg  2 mg Oral PRN Larita Fife, MD   2 mg at 07/03/21 1323  ? melatonin tablet 5 mg  5 mg Oral QHS Aria Jarrard, [name], MD   5 mg at 07/12/21 2050  ? midazolam (VERSED) injection 2 mg  2 mg Intravenous Once Amritpal Shropshire T, MD      ? mirtazapine (REMERON) tablet 45 mg  45 mg Oral QHS Riannah Stagner T, MD   45 mg at 07/12/21 2050  ?  multivitamin with minerals tablet 1 tablet  1 tablet Oral Daily Shayana Hornstein, [name], MD   1 tablet at 07/12/21 0800  ? ondansetron (ZOFRAN) injection 4 mg  4 mg Intravenous Once PRN Molli Barrows, MD      ? ondansetron (ZOFRAN-ODT) disintegrating tablet 4 mg  4 mg Oral Q4H PRN Vijay Durflinger, [name], MD      ? propranolol (INDERAL) tablet 10 mg  10 mg Oral TID Marcelle Hepner, [name], MD   10 mg at 07/13/21 4010  ? QUEtiapine (SEROQUEL) tablet 100 mg  100 mg Oral QHS Jaxzen Vanhorn T, MD   100 mg at 07/12/21 2050  ? traZODone (DESYREL) tablet 100 mg  100 mg Oral QHS Breane Grunwald, [name], MD   100 mg at 07/12/21 2050  ? traZODone (DESYREL) tablet 100 mg  100 mg Oral QHS PRN Markese Bloxham, [name], MD   100 mg at 07/12/21 2051  ? venlafaxine XR (EFFEXOR-XR) 24 hr capsule 150 mg  150 mg Oral Q breakfast Angelle Isais, [name], MD   150 mg at 07/13/21 2725  ? Vitamin D (Ergocalciferol) (DRISDOL) capsule 50,000 Units  50,000 Units Oral Q7 days Yarithza Mink, [name], MD   50,000 Units at 07/09/21 1847  ? ? ?Lab Results:  ?Results for orders placed or performed during the hospital encounter of 06/23/21 (from the past 48 hour(s))  ?Glucose, capillary     Status: None  ? Collection Time: 07/11/21 11:29 AM  ?Result Value Ref Range  ? Glucose-Capillary 91 70 - 99 mg/dL  ?  Comment: Glucose reference range applies only to samples taken after fasting for at least 8 hours.  ?Glucose, capillary     Status: None  ? Collection Time: 07/13/21  6:43 AM  ?Result Value Ref Range  ? Glucose-Capillary 87 70 - 99 mg/dL  ?  Comment: Glucose reference range applies only to samples taken after fasting for at least 8 hours.  ? ? ?Blood Alcohol level:  ?Lab Results  ?Component Value Date  ? ETH <10 06/07/2021  ? ETH <10 11/22/2020  ? ? ?Metabolic Disorder Labs: ?Lab Results  ?Component Value Date  ? HGBA1C 5.1 06/07/2021  ? MPG 99.67 06/07/2021  ? MPG 111.15 11/24/2020  ? ?No results found for: PROLACTIN ?Lab Results  ?Component Value Date  ?  CHOL 197 06/07/2021  ? TRIG 149 06/07/2021  ?  HDL 44 06/07/2021  ? CHOLHDL 4.5 06/07/2021  ? VLDL 30 06/07/2021  ? LDLCALC 123 (H) 06/07/2021  ? LDLCALC 148 (H) 11/24/2020  ? ? ?Physical Findings: ?AIMS:  , ,  ,  ,    ?CIWA:    ?COWS:    ? ?Musculoskeletal: ?Strength & Muscle Tone: within normal limits ?Gait & Station: normal ?Patient leans: N/A ? ?Psychiatric Specialty Exam: ? ?Presentation  ?General Appearance: Appropriate for Environment; Bizarre; Disheveled; Fairly Groomed ? ?Eye Contact:Good ? ?Speech:Clear and Coherent; Normal Rate ? ?Speech Volume:Normal ? ?Handedness:Right ? ? ?Mood and Affect  ?Mood:Anxious; Depressed; Hopeless; Irritable ? ?Affect:Blunt; Depressed ? ? ?Thought Process  ?Thought Processes:Coherent; Linear ? ?Descriptions of Associations:Intact ? ?Orientation:Full (Time, Place and Person) ? ?Thought Content:Logical; WDL ? ?History of Schizophrenia/Schizoaffective disorder:No ? ?Duration of Psychotic Symptoms:N/A ? ?Hallucinations:No data recorded ?Ideas of Reference:None ? ?Suicidal Thoughts:No data recorded ?Homicidal Thoughts:No data recorded ? ?Sensorium  ?Memory:Immediate Fair; Recent Fair; Remote Fair ? ?Judgment:Fair ? ?Insight:Fair ? ? ?Executive Functions  ?Concentration:Fair ? ?Attention Span:Good ? ?Recall:Good ? ?[place] ? ?Language:Good ? ? ?Psychomotor Activity  ?Psychomotor Activity:No data recorded ? ?Assets  ?Assets:Communication Skills; Physical Health; Social Support ? ? ?Sleep  ?Sleep:No data recorded ? ? ?Physical Exam: ?Physical Exam ?Vitals and nursing note reviewed.  ?Constitutional:   ?   Appearance: Normal appearance.  ?HENT:  ?   Head: Normocephalic and atraumatic.  ?   Mouth/Throat:  ?   Pharynx: Oropharynx is clear.  ?Eyes:  ?   Pupils: Pupils are equal, round, and reactive to light.  ?Cardiovascular:  ?   Rate and Rhythm: Normal rate and regular rhythm.  ?Pulmonary:  ?   Effort: Pulmonary effort is normal.  ?   Breath sounds: Normal breath sounds.  ?Abdominal:  ?   General: Abdomen is flat.  ?    Palpations: Abdomen is soft.  ?Musculoskeletal:     ?   General: Normal range of motion.  ?Skin: ?   General: Skin is warm and dry.  ?Neurological:  ?   General: No focal deficit present.  ?   Mental Status: He

## 2021-07-13 NOTE — Progress Notes (Signed)
Patient pleasant and cooperative. Noted in dayroom socializing with peers. Medication compliant. Continues to endorse depression, sadness. Denies SI, HI, AVH. Reports he is feeling just a little bit better.  ?Encouragement and support provided. Safety checks maintained. Medications given as prescribed. Pt receptive and remains safe on unit with q 15 min checks. ?

## 2021-07-13 NOTE — H&P (Signed)
Casey Silva is an 66 y.o. male.   ?Chief Complaint: Mood continues to improve.  Still occasional passive suicidal thoughts but nothing definite.  Feels more energetic and hopeful ?HPI: History of recurrent severe depression ? ?Past Medical History:  ?Diagnosis Date  ? Anxiety   ? Asthma   ? Depression   ? ? ?History reviewed. No pertinent surgical history. ? ?Family History  ?Problem Relation Age of Onset  ? Bipolar disorder Mother   ? ?Social History:  reports that he has never smoked. He has never used smokeless tobacco. He reports that he does not drink alcohol and does not use drugs. ? ?Allergies:  ?Allergies  ?Allergen Reactions  ? Dust Mite Mixed Allergen Ext [Mite (D. Farinae)] Shortness Of Breath  ? Bee Pollen Other (See Comments)  ?  Watery Eyes  ? ? ?Medications Prior to Admission  ?Medication Sig Dispense Refill  ? amLODipine (NORVASC) 10 MG tablet Take 1 tablet (10 mg total) by mouth daily. 30 tablet 0  ? gabapentin (NEURONTIN) 400 MG capsule Take 1 capsule (400 mg total) by mouth 3 (three) times daily. 90 capsule 0  ? LORazepam (ATIVAN) 0.5 MG tablet Take 1 tablet (0.5 mg total) by mouth 2 (two) times daily as needed for anxiety. 30 tablet 0  ? melatonin 5 MG TABS Take 1 tablet (5 mg total) by mouth at bedtime. 30 tablet 0  ? mirtazapine (REMERON) 30 MG tablet Take 1 tablet (30 mg total) by mouth at bedtime. 30 tablet 3  ? propranolol (INDERAL) 10 MG tablet Take 1 tablet (10 mg total) by mouth 3 (three) times daily. 90 tablet 0  ? QUEtiapine (SEROQUEL) 100 MG tablet Take 1 tablet (100 mg total) by mouth at bedtime. 30 tablet 0  ? QUEtiapine (SEROQUEL) 25 MG tablet Take 1 tablet (25 mg total) by mouth 2 (two) times daily. 60 tablet 0  ? traZODone (DESYREL) 100 MG tablet Take 1 tablet (100 mg total) by mouth at bedtime. 30 tablet 0  ? venlafaxine XR (EFFEXOR-XR) 37.5 MG 24 hr capsule Take 3 capsules (112.5 mg total) by mouth daily with breakfast. 30 capsule 0  ? Vitamin D, Ergocalciferol, (DRISDOL) 1.25  MG (50000 UNIT) CAPS capsule Take 1 capsule (50,000 Units total) by mouth every 7 (seven) days. 5 capsule 0  ? ? ?Results for orders placed or performed during the hospital encounter of 06/23/21 (from the past 48 hour(s))  ?Glucose, capillary     Status: None  ? Collection Time: 07/13/21  6:43 AM  ?Result Value Ref Range  ? Glucose-Capillary 87 70 - 99 mg/dL  ?  Comment: Glucose reference range applies only to samples taken after fasting for at least 8 hours.  ? ?No results found. ? ?Review of Systems  ?Constitutional: Negative.   ?HENT: Negative.    ?Eyes: Negative.   ?Respiratory: Negative.    ?Cardiovascular: Negative.   ?Gastrointestinal: Negative.   ?Musculoskeletal: Negative.   ?Skin: Negative.   ?Neurological: Negative.   ?Psychiatric/Behavioral:  Positive for dysphoric mood. Negative for agitation, behavioral problems, confusion, decreased concentration and hallucinations.   ?All other systems reviewed and are negative. ? ?Blood pressure (!) 126/57, pulse 64, temperature 98.6 ?F (37 ?C), temperature source Tympanic, resp. rate 16, height '5\' 6"'$  (1.676 m), weight 76.2 kg, SpO2 98 %. ?Physical Exam ?Vitals reviewed.  ?Constitutional:   ?   Appearance: He is well-developed.  ?HENT:  ?   Head: Normocephalic and atraumatic.  ?Eyes:  ?   Conjunctiva/sclera: Conjunctivae normal.  ?  Pupils: Pupils are equal, round, and reactive to light.  ?Cardiovascular:  ?   Heart sounds: Normal heart sounds.  ?Pulmonary:  ?   Effort: Pulmonary effort is normal.  ?Abdominal:  ?   Palpations: Abdomen is soft.  ?Musculoskeletal:     ?   General: Normal range of motion.  ?   Cervical back: Normal range of motion.  ?Skin: ?   General: Skin is warm and dry.  ?Neurological:  ?   General: No focal deficit present.  ?   Mental Status: He is alert.  ?Psychiatric:     ?   Attention and Perception: Attention normal.     ?   Mood and Affect: Mood normal.     ?   Speech: Speech normal.     ?   Behavior: Behavior is cooperative.     ?    Thought Content: Thought content normal.     ?   Cognition and Memory: Memory is impaired.  ?  ? ?Assessment/Plan ?Continue course of ECT as we are seeing continued improvement ? ?Alethia Berthold, MD ?07/13/2021, 11:29 AM ? ? ? ?

## 2021-07-13 NOTE — Progress Notes (Signed)
[facility] LCSW Progress Note ?  ?07/13/2021    3:40 PM ? ?Avigdor Dollar  ? ?[phone]  ? ?Type of Contact and Topic:  DSS callback   ? ?CSW contacted Addriene Carmichael, [place], related to an email received. Unable to reach Addriene, Left HIPAA compliant voicemail with contact information and callback request. Situation ongoing, CSW will continue to monitor and update note as more information becomes available.  ? ?  ?Signed:  ?Durenda Hurt, MSW, LCSWA, LCAS ?07/13/2021 3:40 PM ?  ?  ?

## 2021-07-13 NOTE — Anesthesia Procedure Notes (Signed)
Procedure Name: General with mask airway ?Date/Time: 07/13/2021 1:25 PM ?Performed by: Tollie Eth, CRNA ?Pre-anesthesia Checklist: Patient identified, Emergency Drugs available, Suction available and Patient being monitored ?Patient Re-evaluated:Patient Re-evaluated prior to induction ?Oxygen Delivery Method: Circle system utilized ?Preoxygenation: Pre-oxygenation with 100% oxygen ?Induction Type: IV induction ?Ventilation: Mask ventilation without difficulty and Mask ventilation throughout procedure ?Airway Equipment and Method: Bite block ?Placement Confirmation: positive ETCO2 ?Dental Injury: Teeth and Oropharynx as per pre-operative assessment  ? ? ? ? ?

## 2021-07-13 NOTE — Anesthesia Preprocedure Evaluation (Signed)
Anesthesia Evaluation  ?Patient identified by MRN, date of birth, ID band ?Patient awake ? ? ? ?Reviewed: ?Allergy & Precautions, H&P , NPO status , Patient's Chart, lab work & pertinent test results, reviewed documented beta blocker date and time  ? ?Airway ?Mallampati: II ? ? ?Neck ROM: full ? ? ? Dental ? ?(+) Poor Dentition ?  ?Pulmonary ?asthma , COPD,  ?  ?Pulmonary exam normal ? ? ? ? ? ? ? Cardiovascular ?Exercise Tolerance: Poor ?hypertension, Normal cardiovascular exam ?Rhythm:regular Rate:Normal ? ? ?  ?Neuro/Psych ?PSYCHIATRIC DISORDERS Anxiety Depression Bipolar Disorder  Neuromuscular disease   ? GI/Hepatic ?negative GI ROS, Neg liver ROS,   ?Endo/Other  ?negative endocrine ROS ? Renal/GU ?negative Renal ROS  ?negative genitourinary ?  ?Musculoskeletal ? ? Abdominal ?  ?Peds ? Hematology ?negative hematology ROS ?(+)   ?Anesthesia Other Findings ?Past Medical History: ?No date: Anxiety ?No date: Asthma ?No date: Depression ?History reviewed. No pertinent surgical history. ?BMI   ? Body Mass Index: 27.12 kg/m?  ?  ? Reproductive/Obstetrics ?negative OB ROS ? ?  ? ? ? ? ? ? ? ? ? ? ? ? ? ?  ?  ? ? ? ? ? ? ? ? ?Anesthesia Physical ? ?Anesthesia Plan ? ?ASA: 3 ? ?Anesthesia Plan: General  ? ?Post-op Pain Management:   ? ?Induction: Intravenous ? ?PONV Risk Score and Plan: 2 and Ondansetron and TIVA ? ?Airway Management Planned: Mask ? ?Additional Equipment: None ? ?Intra-op Plan:  ? ?Post-operative Plan:  ? ?Informed Consent: I have reviewed the patients History and Physical, chart, labs and discussed the procedure including the risks, benefits and alternatives for the proposed anesthesia with the patient or authorized representative who has indicated his/her understanding and acceptance.  ? ? ? ?Dental Advisory Given ? ?Plan Discussed with: CRNA ? ?Anesthesia Plan Comments: (Discussed risks of anesthesia with patient, including PONV, muscle aches. Rare risks discussed as  well, such as cardiorespiratory sequelae, need for airway intervention and its associated risks including lip/dental/eye damage and sore throat, and allergic reactions. Discussed the role of CRNA in patient's perioperative care. Patient understands.)  ? ? ? ? ? ? ?Anesthesia Quick Evaluation ? ?

## 2021-07-13 NOTE — Group Note (Signed)
[place] LCSW Group Therapy Note ? ? ?Group Date: 07/13/2021 ?Start Time: 1300 ?End Time: 1400 ? ? ?Type of Therapy/Topic:  Group Therapy:  Emotion Regulation ? ?Participation Level:  Did Not Attend  ? ? ? ?Description of Group:   ? The purpose of this group is to assist patients in learning to regulate negative emotions and experience positive emotions. Patients will be guided to discuss ways in which they have been vulnerable to their negative emotions. These vulnerabilities will be juxtaposed with experiences of positive emotions or situations, and patients challenged to use positive emotions to combat negative ones. Special emphasis will be placed on coping with negative emotions in conflict situations, and patients will process healthy conflict resolution skills. ? ?Therapeutic Goals: ?Patient will identify two positive emotions or experiences to reflect on in order to balance out negative emotions:  ?Patient will label two or more emotions that they find the most difficult to experience:  ?Patient will be able to demonstrate positive conflict resolution skills through discussion or role plays:  ? ?Summary of Patient Progress: ? ? ?x ? ? ? ?Therapeutic Modalities:   ?Cognitive Behavioral Therapy ?Feelings Identification ?Dialectical Behavioral Therapy ? ? ?Karalee Height, Student-Social Work ?

## 2021-07-13 NOTE — Procedures (Signed)
ECT SERVICES ?Physician?s Interval Evaluation & Treatment Note ? ?Patient Identification: Casey Silva ?MRN:  [id number] ?Date of Evaluation:  07/13/2021 ?[place] #: 8 ? ?MADRS:  ? ?MMSE:  ? ?P.E. Findings: ? ?Patient has cleaned himself up.  Otherwise no change to physical exam ? ?Psychiatric Interval Note: ? ?Patient acknowledges some improvement in his mood ? ?Subjective: ? ?Patient is a 66 y.o. male seen for evaluation for Electroconvulsive Therapy. ?Slightly brighter slightly more optimistic ? ?Treatment Summary: ? ? '[]'$   Right Unilateral             '[x]'$  Bilateral ?  ?% Energy : 1.0 ms 100% ? ? Impedance: 1680 ohms ? ?Seizure Energy Index: 0258 ?V squared ? ?Postictal Suppression Index: 82% ? ?Seizure Concordance Index: 94% ? ?Medications ? ?Pre Shock: Ketamine 100 mg succinylcholine 100 mg ? ?Post Shock:   ? ?Seizure Duration: 20 seconds EMG 35 seconds EEG ? ? ?Comments: ?Continue with next treatment Friday ? ?Lungs: ? ?'[x]'$   Clear to auscultation              ? ?'[]'$  Other:  ? ?Heart: ?  ? '[x]'$   Regular rhythm             '[]'$  irregular rhythm ? ? ? '[x]'$   Previous H&P reviewed, patient examined and there are NO CHANGES              ?  ? '[]'$   Previous H&P reviewed, patient examined and there are changes noted. ? ? ?Alethia Berthold, MD ?4/5/20233:28 PM ? ? ? ?

## 2021-07-13 NOTE — Transfer of Care (Signed)
Immediate Anesthesia Transfer of Care Note ? ?Patient: Casey Silva ? ?Procedure(s) Performed: ECT TX ? ?Patient Location: PACU ? ?Anesthesia Type:General ? ?Level of Consciousness: drowsy ? ?Airway & Oxygen Therapy: Patient Spontanous Breathing and Patient connected to face mask oxygen ? ?Post-op Assessment: Report given to RN and Post -op Vital signs reviewed and stable ? ?Post vital signs: Reviewed and stable ? ?Last Vitals:  ?Vitals Value Taken Time  ?BP    ?Temp    ?Pulse    ?Resp    ?SpO2    ? ? ?Last Pain:  ?Vitals:  ? 07/13/21 1103  ?TempSrc: Tympanic  ?PainSc: 0-No pain  ?   ? ?Patients Stated Pain Goal: 0 (07/07/21 0900) ? ?Complications: No notable events documented. ?

## 2021-07-13 NOTE — Anesthesia Postprocedure Evaluation (Signed)
Anesthesia Post Note ? ?Patient: Bryant Saye ? ?Procedure(s) Performed: ECT TX ? ?Patient location during evaluation: PACU ?Anesthesia Type: General ?Level of consciousness: awake and alert ?Pain management: pain level controlled ?Vital Signs Assessment: post-procedure vital signs reviewed and stable ?Respiratory status: spontaneous breathing, nonlabored ventilation, respiratory function stable and patient connected to nasal cannula oxygen ?Cardiovascular status: blood pressure returned to baseline and stable ?Postop Assessment: no apparent nausea or vomiting ?Anesthetic complications: no ? ? ?No notable events documented. ? ? ?Last Vitals:  ?Vitals:  ? 07/13/21 1350 07/13/21 1355  ?BP: (!) 134/91 (!) 120/91  ?Pulse: 81 78  ?Resp: 12 14  ?Temp: (!) 36.3 ?C   ?SpO2: 99% 100%  ?  ?Last Pain:  ?Vitals:  ? 07/13/21 1355  ?TempSrc:   ?PainSc: 0-No pain  ? ? ?  ?  ?  ?  ?  ?  ? ?Arita Miss ? ? ? ? ?

## 2021-07-13 NOTE — Progress Notes (Signed)
Patient awake/alert x4. Tolerated gingerale while in pacu. Talkative, pleasant post ECT ?

## 2021-07-13 NOTE — Plan of Care (Signed)
D: Pt alert and oriented. Pt rates depression 6/10, hopelessness 6/10, and anxiety 6/10. Pt goal: "Getting through the day." Pt reports energy level as normal and concentration as being poor. Pt reports sleep last night as being poor. Pt did not receive medications for sleep. Pt denies experiencing any pain at this time. Pt denies experiencing any HI, or AVH at this time, however endorses SI w/o a plan and verbally contracts for safety.  ? ?Pt took a shower this morning prior to ECT. Pt is A/O x 4. Upon return from ECT 2 hour post assessment pt is disoriented to time, place, and situation however continues to have a bright affect. Pt is ambulatory, out of room and eating post ECT. Pt states tomorrow his is going to continue to do what needs to be done (shower, attend groups, and be active on the unit).  ? ?A: Scheduled medications administered to pt, per MD orders. Support and encouragement provided. Frequent verbal contact made. Routine safety checks conducted q15 minutes.  ? ?R: No adverse drug reactions noted. Pt verbally contracts for safety at this time. Pt compliant with medications and treatment plan. Pt interacts well with others on the unit. Pt remains safe at this time. Will continue to monitor.  ? ?Problem: Activity: ?Goal: Interest or engagement in leisure activities will improve ?Outcome: Progressing ?Goal: Imbalance in normal sleep/wake cycle will improve ?Outcome: Progressing ?  ?

## 2021-07-13 NOTE — Progress Notes (Signed)
D: Pt alert and oriented. Pt rates depression 0/10, hopelessness 0/10, and anxiety 0/10.  Pt reports energy level as good and concentration as being good.  Pt denies experiencing any pain at this time. Pt denies experiencing any SI/HI, or AVH at this time.  ? ?A: Scheduled medications administered to pt, per MD orders. Support and encouragement provided. Frequent verbal contact made. Routine safety checks conducted q15 minutes.  ? ?R: No adverse drug reactions noted. Pt verbally contracts for safety at this time. Pt complaint with medications and treatment plan. Pt interacts well with others on the unit. Pt remains safe at this time. Will continue to monitor.    ?

## 2021-07-13 NOTE — Plan of Care (Signed)
?  Problem: Activity: ?Goal: Interest or engagement in leisure activities will improve ?Outcome: Progressing ?  ?Problem: Coping: ?Goal: Coping ability will improve ?Description: PT IS PROGRESSING. ?Outcome: Progressing ?Goal: Will verbalize feelings ?Outcome: Progressing ?  ?Problem: Health Behavior/Discharge Planning: ?Goal: Ability to make decisions will improve ?Outcome: Progressing ?Goal: Compliance with therapeutic regimen will improve ?Outcome: Progressing ?  ?

## 2021-07-14 MED ORDER — GABAPENTIN 100 MG PO CAPS
100.0000 mg | ORAL_CAPSULE | Freq: Three times a day (TID) | ORAL | Status: DC
  Administered 2021-07-14 – 2021-07-15 (×3): 100 mg via ORAL
  Filled 2021-07-14 (×3): qty 1

## 2021-07-14 NOTE — BH IP Treatment Plan (Signed)
Interdisciplinary Treatment and Diagnostic Plan Update ? ?07/14/2021 ?Time of Session: 6213 ?Casey Silva ?MRN: [id number] ? ?Principal Diagnosis: Severe recurrent major depression with psychotic features ([place]) ? ?Secondary Diagnoses: Principal Problem: ?  Severe recurrent major depression with psychotic features ([place]) ?Active Problems: ?  Primary insomnia ?  GAD (generalized anxiety disorder) ?  Essential hypertension ? ? ?Current Medications:  ?Current Facility-Administered Medications  ?Medication Dose Route Frequency Provider Last Rate Last Admin  ? 0.9 %  sodium chloride infusion  500 mL Intravenous Once Clapacs, [name], MD      ? 0.9 %  sodium chloride infusion  500 mL Intravenous Once Clapacs, [name], MD      ? acetaminophen (TYLENOL) tablet 650 mg  650 mg Oral Once PRN Darrin Nipper, MD      ? Or  ? acetaminophen (TYLENOL) 160 MG/5ML solution 325-650 mg  325-650 mg Oral Q4H PRN Darrin Nipper, MD      ? amLODipine (NORVASC) tablet 10 mg  10 mg Oral Daily Clapacs, [name], MD   10 mg at 07/14/21 6295  ? diclofenac Sodium (VOLTAREN) 1 % topical gel 2 g  2 g Topical QID Clapacs, [name], MD   2 g at 07/06/21 2125  ? feeding supplement (ENSURE ENLIVE / ENSURE PLUS) liquid 237 mL  237 mL Oral BID BM Clapacs, [name], MD   237 mL at 07/14/21 1258  ? fentaNYL (SUBLIMAZE) injection 25 mcg  25 mcg Intravenous Q5 min PRN Molli Barrows, MD      ? gabapentin (NEURONTIN) capsule 100 mg  100 mg Oral TID Clapacs, [name], MD      ? hydrOXYzine (ATARAX) tablet 50 mg  50 mg Oral Q6H PRN Clapacs, [name], MD   50 mg at 07/12/21 2050  ? ibuprofen (ADVIL) tablet 600 mg  600 mg Oral Q6H PRN Clapacs, [name], MD   600 mg at 06/27/21 0915  ? loperamide (IMODIUM) capsule 2 mg  2 mg Oral PRN Larita Fife, MD   2 mg at 07/03/21 1323  ? melatonin tablet 5 mg  5 mg Oral QHS Clapacs, [name], MD   5 mg at 07/13/21 2109  ? midazolam (VERSED) injection 2 mg  2 mg Intravenous Once Clapacs, [name], MD      ? mirtazapine (REMERON) tablet 45 mg  45  mg Oral QHS Clapacs, [name], MD   45 mg at 07/13/21 2109  ? multivitamin with minerals tablet 1 tablet  1 tablet Oral Daily Clapacs, [name], MD   1 tablet at 07/14/21 2841  ? ondansetron (ZOFRAN) injection 4 mg  4 mg Intravenous Once PRN Arita Miss, MD      ? ondansetron (ZOFRAN-ODT) disintegrating tablet 4 mg  4 mg Oral Q4H PRN Clapacs, [name], MD      ? QUEtiapine (SEROQUEL) tablet 100 mg  100 mg Oral QHS Clapacs, [name], MD   100 mg at 07/13/21 2110  ? traZODone (DESYREL) tablet 100 mg  100 mg Oral QHS PRN Clapacs, [name], MD   100 mg at 07/12/21 2051  ? venlafaxine XR (EFFEXOR-XR) 24 hr capsule 150 mg  150 mg Oral Q breakfast Clapacs, [name], MD   150 mg at 07/14/21 3244  ? Vitamin D (Ergocalciferol) (DRISDOL) capsule 50,000 Units  50,000 Units Oral Q7 days Clapacs, [name], MD   50,000 Units at 07/09/21 1847  ? ?PTA Medications: ?Medications Prior to Admission  ?Medication Sig Dispense Refill Last  Dose  ? amLODipine (NORVASC) 10 MG tablet Take 1 tablet (10 mg total) by mouth daily. 30 tablet 0 07/03/2021  ? gabapentin (NEURONTIN) 400 MG capsule Take 1 capsule (400 mg total) by mouth 3 (three) times daily. 90 capsule 0 07/03/2021  ? LORazepam (ATIVAN) 0.5 MG tablet Take 1 tablet (0.5 mg total) by mouth 2 (two) times daily as needed for anxiety. 30 tablet 0 07/03/2021  ? melatonin 5 MG TABS Take 1 tablet (5 mg total) by mouth at bedtime. 30 tablet 0 07/03/2021  ? mirtazapine (REMERON) 30 MG tablet Take 1 tablet (30 mg total) by mouth at bedtime. 30 tablet 3 07/03/2021  ? propranolol (INDERAL) 10 MG tablet Take 1 tablet (10 mg total) by mouth 3 (three) times daily. 90 tablet 0 07/03/2021  ? QUEtiapine (SEROQUEL) 100 MG tablet Take 1 tablet (100 mg total) by mouth at bedtime. 30 tablet 0 07/03/2021  ? QUEtiapine (SEROQUEL) 25 MG tablet Take 1 tablet (25 mg total) by mouth 2 (two) times daily. 60 tablet 0 07/03/2021  ? traZODone (DESYREL) 100 MG tablet Take 1 tablet (100 mg total) by mouth at bedtime. 30 tablet 0 07/03/2021   ? venlafaxine XR (EFFEXOR-XR) 37.5 MG 24 hr capsule Take 3 capsules (112.5 mg total) by mouth daily with breakfast. 30 capsule 0 07/03/2021  ? Vitamin D, Ergocalciferol, (DRISDOL) 1.25 MG (50000 UNIT) CAPS capsule Take 1 capsule (50,000 Units total) by mouth every 7 (seven) days. 5 capsule 0 07/03/2021  ? ? ?Patient Stressors: Loss of connection with work, loss of sense of purpose   ?Other: loss of mental health   ? ?Patient Strengths: Ability for insight  ?Communication skills  ?General fund of knowledge  ?Motivation for treatment/growth  ? ?Treatment Modalities: Medication Management, Group therapy, Case management,  ?1 to 1 session with clinician, Psychoeducation, Recreational therapy. ? ? ?Physician Treatment Plan for Primary Diagnosis: Severe recurrent major depression with psychotic features ([place]) ?Long Term Goal(s): Improvement in symptoms so as ready for discharge  ? ?Short Term Goals: Ability to maintain clinical measurements within normal limits will improve ?Compliance with prescribed medications will improve ?Ability to verbalize feelings will improve ?Ability to disclose and discuss suicidal ideas ?Ability to demonstrate self-control will improve ? ?Medication Management: Evaluate patient's response, side effects, and tolerance of medication regimen. ? ?Therapeutic Interventions: 1 to 1 sessions, Unit Group sessions and Medication administration. ? ?Evaluation of Outcomes: Progressing ? ?Physician Treatment Plan for Secondary Diagnosis: Principal Problem: ?  Severe recurrent major depression with psychotic features ([place]) ?Active Problems: ?  Primary insomnia ?  GAD (generalized anxiety disorder) ?  Essential hypertension ? ?Long Term Goal(s): Improvement in symptoms so as ready for discharge  ? ?Short Term Goals: Ability to maintain clinical measurements within normal limits will improve ?Compliance with prescribed medications will improve ?Ability to verbalize feelings will improve ?Ability to disclose  and discuss suicidal ideas ?Ability to demonstrate self-control will improve    ? ?Medication Management: Evaluate patient's response, side effects, and tolerance of medication regimen. ? ?Therapeutic Interventions: 1 to 1 sessions, Unit Group sessions and Medication administration. ? ?Evaluation of Outcomes: Progressing ? ? ?RN Treatment Plan for Primary Diagnosis: Severe recurrent major depression with psychotic features ([place]) ?Long Term Goal(s): Knowledge of disease and therapeutic regimen to maintain health will improve ? ?Short Term Goals: Ability to remain free from injury will improve, Ability to verbalize frustration and anger appropriately will improve, Ability to demonstrate self-control, Ability to participate in decision making will improve, Ability  to verbalize feelings will improve, Ability to disclose and discuss suicidal ideas, Ability to identify and develop effective coping behaviors will improve, Compliance with prescribed medications will improve, and   ? ?Medication Management: RN will administer medications as ordered by provider, will assess and evaluate patient's response and provide education to patient for prescribed medication. RN will report any adverse and/or side effects to prescribing provider. ? ?Therapeutic Interventions: 1 on 1 counseling sessions, Psychoeducation, Medication administration, Evaluate responses to treatment, Monitor vital signs and CBGs as ordered, Perform/monitor CIWA, COWS, AIMS and Fall Risk screenings as ordered, Perform wound care treatments as ordered. ? ?Evaluation of Outcomes: Progressing ? ? ?LCSW Treatment Plan for Primary Diagnosis: Severe recurrent major depression with psychotic features ([place]) ?Long Term Goal(s): Safe transition to appropriate next level of care at discharge, Engage patient in therapeutic group addressing interpersonal concerns. ? ?Short Term Goals: Engage patient in aftercare planning with referrals and resources, Increase social  support, Increase ability to appropriately verbalize feelings, Increase emotional regulation, Facilitate acceptance of mental health diagnosis and concerns, Facilitate patient progression through stages of

## 2021-07-14 NOTE — Group Note (Signed)
[place] LCSW Group Therapy Note ? ? ?Group Date: 07/14/2021 ?Start Time: 1300 ?End Time: 1400 ? ? ?Type of Therapy/Topic:  Group Therapy:  Balance in Life ? ?Participation Level:  Active  ? ?Description of Group:   ? This group will address the concept of balance and how it feels and looks when one is unbalanced. Patients will be encouraged to process areas in their lives that are out of balance, and identify reasons for remaining unbalanced. Facilitators will guide patients utilizing problem- solving interventions to address and correct the stressor making their life unbalanced. Understanding and applying boundaries will be explored and addressed for obtaining  and maintaining a balanced life. Patients will be encouraged to explore ways to assertively make their unbalanced needs known to significant others in their lives, using other group members and facilitator for support and feedback. ? ?Therapeutic Goals: ?Patient will identify two or more emotions or situations they have that consume much of in their lives. ?Patient will identify signs/triggers that life has become out of balance:  ?Patient will identify two ways to set boundaries in order to achieve balance in their lives:  ?Patient will demonstrate ability to communicate their needs through discussion and/or role plays ? ?Summary of Patient Progress: ? ? ? ?Patient was present for the entirety of the group session. Patient was an active listener and participated in the topic of discussion, provided helpful feedback to others, and attempted to add nuance to topic of conversation. Patient was largely tangential and disorganized, though made an effort to engage in topic of discussion.  ? ? ? ?Therapeutic Modalities:   ?Cognitive Behavioral Therapy ?Solution-Focused Therapy ?Assertiveness Training ? ? ?Durenda Hurt, LCSWA ?

## 2021-07-14 NOTE — Progress Notes (Signed)
This Probation officer held patient's scheduled Propanolol because the order parameters were not met. Patient's BP was 93/71 @ 0908. MD will be notified. ?

## 2021-07-14 NOTE — Progress Notes (Signed)
Recreation Therapy Notes ? ?Date: 07/14/2021 ? ?Time: 9:15 am  ? ?Location: Craft room   ? ?Behavioral response: Appropriate  ? ?Intervention Topic: Animal Assisted Therapy  ? ?Discussion/Intervention:  ?Animal Assisted Therapy took place today during group.  Animal Assisted Therapy is the planned inclusion of an animal in a patient's treatment plan. The patients were able to engage in therapy with an animal during group. Participants were educated on what a service dog is and the different between a support dog and a service dog. Patient were informed on how animal needs are like a person needs. Individuals were enlightened on the process to get a service animal or support animal. Patients got the opportunity to pet the animal and were offered emotional support from the animal and staff.  ?Clinical Observations/Feedback:  ?[name] and was on topic and was focused on what peers and staff had to say. Participant shared their experiences and history with animals. Individual was social with peers, staff and animal while participating in group.  ?Kyira Volkert LRT/CTRS  ? ? ? ? ? ? ? ?Nickayla Mcinnis ?07/14/2021 12:27 PM ?

## 2021-07-14 NOTE — Progress Notes (Signed)
Patient pleasant and cooperative. Denies SI, HI, AVH. Endorses anxiety. Prn given with good relief. Noted in dayroom watching television in peers. Reports feeling better today. Patient NPO after midnight for ECT in am. Patient noted sleeping in room at present. No complaints voiced.  ?Encouragement and support provided. Safety checks maintained. Medications given as prescribed. Pt receptive and remains safe on unit with q 15 min checks. ?

## 2021-07-14 NOTE — Progress Notes (Signed)
[facility] MD Progress Note ? ?07/14/2021 2:35 PM ?Casey Silva  ?MRN:  [id number] ?Subjective: Follow-up patient with severe depression receiving ECT.  Patient acknowledges again today that his mood is improving.  He denies today having any suicidal thoughts.  He does mention that he feels fatigued much of the time during the day.  Tolerating ECT pretty well some short-term memory impairment but does not appear delirious.  He has been getting up and getting himself cleaned up much better. ?Principal Problem: Severe recurrent major depression with psychotic features ([place]) ?Diagnosis: Principal Problem: ?  Severe recurrent major depression with psychotic features ([place]) ?Active Problems: ?  Primary insomnia ?  GAD (generalized anxiety disorder) ?  Essential hypertension ? ?Total Time spent with patient: 30 minutes ? ?Past Psychiatric History: Past history of recurrent depression ? ?Past Medical History:  ?Past Medical History:  ?Diagnosis Date  ? Anxiety   ? Asthma   ? Depression   ? History reviewed. No pertinent surgical history. ?Family History:  ?Family History  ?Problem Relation Age of Onset  ? Bipolar disorder Mother   ? ?Family Psychiatric  History: See previous ?Social History:  ?Social History  ? ?Substance and Sexual Activity  ?Alcohol Use No  ?   ?Social History  ? ?Substance and Sexual Activity  ?Drug Use No  ?  ?Social History  ? ?Socioeconomic History  ? Marital status: Single  ?  Spouse name: Not on file  ? Number of children: 1  ? Years of education: Not on file  ? Highest education level: GED or equivalent  ?Occupational History  ? Not on file  ?Tobacco Use  ? Smoking status: Never  ? Smokeless tobacco: Never  ?Vaping Use  ? Vaping Use: Unknown  ?Substance and Sexual Activity  ? Alcohol use: No  ? Drug use: No  ? Sexual activity: Not Currently  ?Other Topics Concern  ? Not on file  ?Social History Narrative  ? Marital status:  Single   Children: Daughter   Employment:  Unemployment. previous work for [employer].   Alcohol:  None   Drugs:  none   Exercise:  Regularly very   Religion:  Jewish  ? ?Social Determinants of Health  ? ?Financial Resource Strain: Not on file  ?Food Insecurity: Not on file  ?Transportation Needs: Not on file  ?Physical Activity: Not on file  ?Stress: Not on file  ?Social Connections: Not on file  ? ?Additional Social History:  ?  ?  ?  ?  ?  ?  ?  ?  ?  ?  ?  ? ?Sleep: Fair ? ?Appetite:  Fair ? ?Current Medications: ?Current Facility-Administered Medications  ?Medication Dose Route Frequency Provider Last Rate Last Admin  ? 0.9 %  sodium chloride infusion  500 mL Intravenous Once Currie Dennin T, MD      ? 0.9 %  sodium chloride infusion  500 mL Intravenous Once Natasia Sanko, [name], MD      ? acetaminophen (TYLENOL) tablet 650 mg  650 mg Oral Once PRN Darrin Nipper, MD      ? Or  ? acetaminophen (TYLENOL) 160 MG/5ML solution 325-650 mg  325-650 mg Oral Q4H PRN Darrin Nipper, MD      ? amLODipine (NORVASC) tablet 10 mg  10 mg Oral Daily Iesha Summerhill, [name], MD   10 mg at 07/14/21 5176  ? diclofenac Sodium (VOLTAREN) 1 % topical gel 2 g  2 g Topical QID Viviana Trimble, [name], MD  2 g at 07/06/21 2125  ? feeding supplement (ENSURE ENLIVE / ENSURE PLUS) liquid 237 mL  237 mL Oral BID BM Vishal Sandlin T, MD   237 mL at 07/14/21 1258  ? fentaNYL (SUBLIMAZE) injection 25 mcg  25 mcg Intravenous Q5 min PRN Molli Barrows, MD      ? gabapentin (NEURONTIN) capsule 100 mg  100 mg Oral TID Lehi Phifer, [name], MD      ? hydrOXYzine (ATARAX) tablet 50 mg  50 mg Oral Q6H PRN Kamarion Zagami, [name], MD   50 mg at 07/12/21 2050  ? ibuprofen (ADVIL) tablet 600 mg  600 mg Oral Q6H PRN Wynter Grave, [name], MD   600 mg at 06/27/21 0915  ? loperamide (IMODIUM) capsule 2 mg  2 mg Oral PRN Larita Fife, MD   2 mg at 07/03/21 1323  ? melatonin tablet 5 mg  5 mg Oral QHS Knoah Nedeau, [name], MD   5 mg at 07/13/21 2109  ? midazolam (VERSED) injection 2 mg  2 mg Intravenous Once Michaeljohn Biss T, MD      ? mirtazapine (REMERON) tablet 45 mg  45 mg  Oral QHS Felder Lebeda, [name], MD   45 mg at 07/13/21 2109  ? multivitamin with minerals tablet 1 tablet  1 tablet Oral Daily Elisabeth Strom, [name], MD   1 tablet at 07/14/21 9735  ? ondansetron (ZOFRAN) injection 4 mg  4 mg Intravenous Once PRN Arita Miss, MD      ? ondansetron (ZOFRAN-ODT) disintegrating tablet 4 mg  4 mg Oral Q4H PRN Swayze Pries, [name], MD      ? propranolol (INDERAL) tablet 10 mg  10 mg Oral TID Kendarius Vigen, [name], MD   10 mg at 07/14/21 1300  ? QUEtiapine (SEROQUEL) tablet 100 mg  100 mg Oral QHS Alante Tolan, [name], MD   100 mg at 07/13/21 2110  ? traZODone (DESYREL) tablet 100 mg  100 mg Oral QHS PRN Chalise Pe, [name], MD   100 mg at 07/12/21 2051  ? venlafaxine XR (EFFEXOR-XR) 24 hr capsule 150 mg  150 mg Oral Q breakfast Liev Brockbank, [name], MD   150 mg at 07/14/21 3299  ? Vitamin D (Ergocalciferol) (DRISDOL) capsule 50,000 Units  50,000 Units Oral Q7 days Iceis Knab, [name], MD   50,000 Units at 07/09/21 1847  ? ? ?Lab Results:  ?Results for orders placed or performed during the hospital encounter of 06/23/21 (from the past 48 hour(s))  ?Glucose, capillary     Status: None  ? Collection Time: 07/13/21  6:43 AM  ?Result Value Ref Range  ? Glucose-Capillary 87 70 - 99 mg/dL  ?  Comment: Glucose reference range applies only to samples taken after fasting for at least 8 hours.  ? ? ?Blood Alcohol level:  ?Lab Results  ?Component Value Date  ? ETH <10 06/07/2021  ? ETH <10 11/22/2020  ? ? ?Metabolic Disorder Labs: ?Lab Results  ?Component Value Date  ? HGBA1C 5.1 06/07/2021  ? MPG 99.67 06/07/2021  ? MPG 111.15 11/24/2020  ? ?No results found for: PROLACTIN ?Lab Results  ?Component Value Date  ? CHOL 197 06/07/2021  ? TRIG 149 06/07/2021  ? HDL 44 06/07/2021  ? CHOLHDL 4.5 06/07/2021  ? VLDL 30 06/07/2021  ? LDLCALC 123 (H) 06/07/2021  ? LDLCALC 148 (H) 11/24/2020  ? ? ?Physical Findings: ?AIMS:  , ,  ,  ,    ?CIWA:    ?COWS:    ? ?Musculoskeletal: ?Strength &  Muscle Tone: within normal limits ?Gait & Station:  normal ?Patient leans: N/A ? ?Psychiatric Specialty Exam: ? ?Presentation  ?General Appearance: Appropriate for Environment; Bizarre; Disheveled; Fairly Groomed ? ?Eye Contact:Good ? ?Speech:Clear and Coherent; Normal Rate ? ?Speech Volume:Normal ? ?Handedness:Right ? ? ?Mood and Affect  ?Mood:Anxious; Depressed; Hopeless; Irritable ? ?Affect:Blunt; Depressed ? ? ?Thought Process  ?Thought Processes:Coherent; Linear ? ?Descriptions of Associations:Intact ? ?Orientation:Full (Time, Place and Person) ? ?Thought Content:Logical; WDL ? ?History of Schizophrenia/Schizoaffective disorder:No ? ?Duration of Psychotic Symptoms:N/A ? ?Hallucinations:No data recorded ?Ideas of Reference:None ? ?Suicidal Thoughts:No data recorded ?Homicidal Thoughts:No data recorded ? ?Sensorium  ?Memory:Immediate Fair; Recent Fair; Remote Fair ? ?Judgment:Fair ? ?Insight:Fair ? ? ?Executive Functions  ?Concentration:Fair ? ?Attention Span:Good ? ?Recall:Good ? ?[place] ? ?Language:Good ? ? ?Psychomotor Activity  ?Psychomotor Activity:No data recorded ? ?Assets  ?Assets:Communication Skills; Physical Health; Social Support ? ? ?Sleep  ?Sleep:No data recorded ? ? ?Physical Exam: ?Physical Exam ?Vitals and nursing note reviewed.  ?Constitutional:   ?   Appearance: Normal appearance.  ?HENT:  ?   Head: Normocephalic and atraumatic.  ?   Mouth/Throat:  ?   Pharynx: Oropharynx is clear.  ?Eyes:  ?   Pupils: Pupils are equal, round, and reactive to light.  ?Cardiovascular:  ?   Rate and Rhythm: Normal rate and regular rhythm.  ?Pulmonary:  ?   Effort: Pulmonary effort is normal.  ?   Breath sounds: Normal breath sounds.  ?Abdominal:  ?   General: Abdomen is flat.  ?   Palpations: Abdomen is soft.  ?Musculoskeletal:     ?   General: Normal range of motion.  ?Skin: ?   General: Skin is warm and dry.  ?Neurological:  ?   General: No focal deficit present.  ?   Mental Status: He is alert. Mental status is at baseline.  ?Psychiatric:     ?    Mood and Affect: Mood normal.     ?   Thought Content: Thought content normal.  ? ?Review of Systems  ?Constitutional: Negative.   ?HENT: Negative.    ?Eyes: Negative.   ?Respiratory: Negative.    ?Cardiovascu

## 2021-07-14 NOTE — Plan of Care (Signed)
D- Patient alert and oriented. Patient presents in a pleasant mood on assessment stating that he didn't sleep too well last night because he "woke up several times during the night" and had "a couple of weird dreams". Patient denies anxiety, but endorsed a little bit of depression, stating things "in general" is why he's feeling this way. Patient also denies SI, HI, AVH, and pain at this time, stating "not right now". Patient had no stated goals for today. ? ?A- Scheduled medications administered to patient, per MD orders. Support and encouragement provided.  Routine safety checks conducted every 15 minutes.  Patient informed to notify staff with problems or concerns. ? ?R- No adverse drug reactions noted. Patient contracts for safety at this time. Patient compliant with medications and treatment plan. Patient receptive, calm, and cooperative. Patient interacts well with others on the unit.  Patient remains safe at this time. ? ?Problem: Education: ?Goal: Utilization of techniques to improve thought processes will improve ?Outcome: Progressing ?Goal: Knowledge of the prescribed therapeutic regimen will improve ?Outcome: Progressing ?  ?Problem: Activity: ?Goal: Interest or engagement in leisure activities will improve ?Outcome: Progressing ?Goal: Imbalance in normal sleep/wake cycle will improve ?Outcome: Progressing ?  ?Problem: Coping: ?Goal: Coping ability will improve ?Description: PT IS PROGRESSING. ?Outcome: Progressing ?Goal: Will verbalize feelings ?Outcome: Progressing ?  ?Problem: Health Behavior/Discharge Planning: ?Goal: Ability to make decisions will improve ?Outcome: Progressing ?Goal: Compliance with therapeutic regimen will improve ?Outcome: Progressing ?  ?Problem: Role Relationship: ?Goal: Will demonstrate positive changes in social behaviors and relationships ?Outcome: Progressing ?  ?Problem: Safety: ?Goal: Ability to disclose and discuss suicidal ideas will improve ?Outcome: Progressing ?Goal:  Ability to identify and utilize support systems that promote safety will improve ?Outcome: Progressing ?  ?Problem: Self-Concept: ?Goal: Will verbalize positive feelings about self ?Outcome: Progressing ?Goal: Level of anxiety will decrease ?Outcome: Progressing ?  ?

## 2021-07-15 ENCOUNTER — Encounter: Payer: Self-pay | Admitting: Psychiatry

## 2021-07-15 ENCOUNTER — Other Ambulatory Visit: Payer: Self-pay | Admitting: Psychiatry

## 2021-07-15 ENCOUNTER — Ambulatory Visit: Payer: 59

## 2021-07-15 ENCOUNTER — Inpatient Hospital Stay: Payer: 59 | Admitting: Registered Nurse

## 2021-07-15 LAB — GLUCOSE, CAPILLARY: Glucose-Capillary: 78 mg/dL (ref 70–99)

## 2021-07-15 MED ORDER — ONDANSETRON 4 MG PO TBDP
4.0000 mg | ORAL_TABLET | Freq: Once | ORAL | Status: AC
  Administered 2021-07-15: 4 mg via ORAL

## 2021-07-15 MED ORDER — KETAMINE HCL 10 MG/ML IJ SOLN
INTRAMUSCULAR | Status: DC | PRN
  Administered 2021-07-15: 100 mg via INTRAVENOUS

## 2021-07-15 MED ORDER — KETOROLAC TROMETHAMINE 30 MG/ML IJ SOLN: 30.0000 mg | Freq: Once | INTRAMUSCULAR | Status: AC

## 2021-07-15 MED ORDER — ONDANSETRON 4 MG PO TBDP
ORAL_TABLET | ORAL | Status: AC
  Filled 2021-07-15: qty 1

## 2021-07-15 MED ORDER — KETOROLAC TROMETHAMINE 30 MG/ML IJ SOLN
INTRAMUSCULAR | Status: AC
  Administered 2021-07-15: 30 mg via INTRAVENOUS
  Filled 2021-07-15: qty 1

## 2021-07-15 MED ORDER — LABETALOL HCL 5 MG/ML IV SOLN
INTRAVENOUS | Status: DC | PRN
  Administered 2021-07-15: 10 mg via INTRAVENOUS

## 2021-07-15 MED ORDER — SODIUM CHLORIDE 0.9 % IV SOLN
500.0000 mL | Freq: Once | INTRAVENOUS | Status: AC
  Administered 2021-07-15: 500 mL via INTRAVENOUS

## 2021-07-15 MED ORDER — ONDANSETRON HCL 4 MG/2ML IJ SOLN
INTRAMUSCULAR | Status: AC
  Administered 2021-07-15: 4 mg via INTRAVENOUS
  Filled 2021-07-15: qty 2

## 2021-07-15 MED ORDER — SUCCINYLCHOLINE CHLORIDE 200 MG/10ML IV SOSY
PREFILLED_SYRINGE | INTRAVENOUS | Status: DC | PRN
Start: 2021-07-15 — End: 2021-07-15
  Administered 2021-07-15: 100 mg via INTRAVENOUS

## 2021-07-15 MED ORDER — KETAMINE HCL 50 MG/ML IJ SOLN
INTRAMUSCULAR | Status: AC
  Filled 2021-07-15: qty 10

## 2021-07-15 MED ORDER — ONDANSETRON HCL 4 MG/2ML IJ SOLN: 4.0000 mg | Freq: Once | INTRAMUSCULAR | Status: AC

## 2021-07-15 NOTE — Anesthesia Preprocedure Evaluation (Signed)
Anesthesia Evaluation  ?Patient identified by MRN, date of birth, ID band ?Patient awake ? ? ? ?Reviewed: ?Allergy & Precautions, H&P , NPO status , Patient's Chart, lab work & pertinent test results, reviewed documented beta blocker date and time  ? ?Airway ?Mallampati: II ? ? ?Neck ROM: full ? ? ? Dental ? ?(+) Poor Dentition ?  ?Pulmonary ?asthma , COPD,  ?  ?Pulmonary exam normal ? ? ? ? ? ? ? Cardiovascular ?Exercise Tolerance: Poor ?hypertension, Pt. on medications ?Normal cardiovascular exam ?Rhythm:regular Rate:Normal ? ? ?  ?Neuro/Psych ?PSYCHIATRIC DISORDERS Anxiety Depression Bipolar Disorder  Neuromuscular disease   ? GI/Hepatic ?negative GI ROS, Neg liver ROS,   ?Endo/Other  ?negative endocrine ROS ? Renal/GU ?negative Renal ROS  ?negative genitourinary ?  ?Musculoskeletal ? ? Abdominal ?  ?Peds ? Hematology ?negative hematology ROS ?(+)   ?Anesthesia Other Findings ?Past Medical History: ?No date: Anxiety ?No date: Asthma ?No date: Depression ?History reviewed. No pertinent surgical history. ?BMI   ? Body Mass Index: 27.12 kg/m?  ?  ? Reproductive/Obstetrics ?negative OB ROS ? ?  ? ? ? ? ? ? ? ? ? ? ? ? ? ?  ?  ? ? ? ? ? ? ? ? ?Anesthesia Physical ? ?Anesthesia Plan ? ?ASA: 3 ? ?Anesthesia Plan: General  ? ?Post-op Pain Management:   ? ?Induction: Intravenous ? ?PONV Risk Score and Plan: 2 and Ondansetron and TIVA ? ?Airway Management Planned: Mask ? ?Additional Equipment: None ? ?Intra-op Plan:  ? ?Post-operative Plan:  ? ?Informed Consent: I have reviewed the patients History and Physical, chart, labs and discussed the procedure including the risks, benefits and alternatives for the proposed anesthesia with the patient or authorized representative who has indicated his/her understanding and acceptance.  ? ? ? ?Dental Advisory Given ? ?Plan Discussed with: CRNA ? ?Anesthesia Plan Comments: (Discussed risks of anesthesia with patient, including PONV, muscle aches. Rare  risks discussed as well, such as cardiorespiratory sequelae, need for airway intervention and its associated risks including lip/dental/eye damage and sore throat, and allergic reactions. Discussed the role of CRNA in patient's perioperative care. Patient understands.)  ? ? ? ? ? ? ?Anesthesia Quick Evaluation ? ?

## 2021-07-15 NOTE — Progress Notes (Signed)
Report was received from [employer], [place] stating that patient is on his way back to the unit from ECT. Per report, patient is doing well and getting up independently. This Probation officer will be awaiting patient's arrival. ?

## 2021-07-15 NOTE — Progress Notes (Signed)
Patient just arrived back to the unit and he stated that he is doing fine and was helped into the restroom. This Probation officer informed patient that his lunch tray should be on the way shortly, he verbalized understanding and thanked this Probation officer. ?

## 2021-07-15 NOTE — Procedures (Signed)
ECT SERVICES ?Physician?s Interval Evaluation & Treatment Note ? ?Patient Identification: Casey Silva ?MRN:  [id number] ?Date of Evaluation:  07/15/2021 ?[place] #: 9 ? ?MADRS:  ? ?MMSE:  ? ?P.E. Findings: ? ?Physical exam unremarkable and unchanged ? ?Psychiatric Interval Note: ? ?Writer more alert acknowledging less depression ? ?Subjective: ? ?Patient is a 66 y.o. male seen for evaluation for Electroconvulsive Therapy. ?Feeling less depressed without any current suicidal ideation ? ?Treatment Summary: ? ? '[]'$   Right Unilateral             '[x]'$  Bilateral ?  ?% Energy : 1.0 ms 100% ? ? Impedance: 1510 ohms ? ?Seizure Energy Index: 32,440 ?V squared ? ?Postictal Suppression Index: No reading but it looks pretty good ? ?Seizure Concordance Index: 93% ? ?Medications ? ?Pre Shock: Ketamine 100 mg succinylcholine 100 mg ? ?Post Shock:   ? ?Seizure Duration: 22 seconds EMG 32 seconds EEG ? ? ?Comments: ?Good quality seizure we will plan to follow up with treatment again on Monday ? ?Lungs: ? ?'[x]'$   Clear to auscultation              ? ?'[]'$  Other:  ? ?Heart: ?  ? '[x]'$   Regular rhythm             '[]'$  irregular rhythm ? ? ? '[x]'$   Previous H&P reviewed, patient examined and there are NO CHANGES              ?  ? '[]'$   Previous H&P reviewed, patient examined and there are changes noted. ? ? ?Alethia Berthold, MD ?4/7/20234:22 PM ? ?  ?

## 2021-07-15 NOTE — Progress Notes (Signed)
Recreation Therapy Notes ? ? ?Date: 07/15/2021 ? ?Time: 10:40 am   ? ?Location: Craft room   ? ?Behavioral response: N/A ?  ?Intervention Topic: Self-care  ? ?Discussion/Intervention: ?Patient refused to attend group.  ? ?Clinical Observations/Feedback:  ?Patient refused to attend group.  ?  ?[name] LRT/CTRS ? ? ? ? ? ? ? ? ?Casey Silva ?07/15/2021 12:33 PM ?

## 2021-07-15 NOTE — Progress Notes (Signed)
Patient was given his morning medication, with a few sips of water, per MD request. Patient tolerated medication administration without any issues. Patient remains safe on the unit. ?

## 2021-07-15 NOTE — Transfer of Care (Signed)
Immediate Anesthesia Transfer of Care Note ? ?Patient: Casey Silva ? ?Procedure(s) Performed: ECT TX ? ?Patient Location: PACU ? ?Anesthesia Type:General ? ?Level of Consciousness: awake and alert  ? ?Airway & Oxygen Therapy: Patient Spontanous Breathing ? ?Post-op Assessment: Report given to RN and Post -op Vital signs reviewed and stable ? ?Post vital signs: Reviewed and stable ? ?Last Vitals:  ?Vitals Value Taken Time  ?BP    ?Temp    ?Pulse    ?Resp    ?SpO2    ? ? ?Last Pain:  ?Vitals:  ? 07/15/21 1120  ?TempSrc: Tympanic  ?PainSc: 0-No pain  ?   ? ?Patients Stated Pain Goal: 0 (07/07/21 0900) ? ?Complications: No notable events documented. ?

## 2021-07-15 NOTE — Anesthesia Procedure Notes (Signed)
Date/Time: 07/15/2021 12:24 PM ?Performed by: Tollie Eth, CRNA ?Pre-anesthesia Checklist: Patient identified, Emergency Drugs available, Suction available and Patient being monitored ?Patient Re-evaluated:Patient Re-evaluated prior to induction ?Oxygen Delivery Method: Circle system utilized ?Preoxygenation: Pre-oxygenation with 100% oxygen ?Induction Type: IV induction ?Ventilation: Mask ventilation without difficulty and Mask ventilation throughout procedure ?Airway Equipment and Method: Bite block ?Placement Confirmation: positive ETCO2 ?Dental Injury: Teeth and Oropharynx as per pre-operative assessment  ? ? ? ? ?

## 2021-07-15 NOTE — H&P (Signed)
Casey Silva is an 66 y.o. male.   ?Chief Complaint: improved mood ?HPI: severe depression ? ?Past Medical History:  ?Diagnosis Date  ? Anxiety   ? Asthma   ? Depression   ? ? ?History reviewed. No pertinent surgical history. ? ?Family History  ?Problem Relation Age of Onset  ? Bipolar disorder Mother   ? ?Social History:  reports that he has never smoked. He has never used smokeless tobacco. He reports that he does not drink alcohol and does not use drugs. ? ?Allergies:  ?Allergies  ?Allergen Reactions  ? Dust Mite Mixed Allergen Ext [Mite (D. Farinae)] Shortness Of Breath  ? Bee Pollen Other (See Comments)  ?  Watery Eyes  ? ? ?Medications Prior to Admission  ?Medication Sig Dispense Refill  ? amLODipine (NORVASC) 10 MG tablet Take 1 tablet (10 mg total) by mouth daily. 30 tablet 0  ? gabapentin (NEURONTIN) 400 MG capsule Take 1 capsule (400 mg total) by mouth 3 (three) times daily. 90 capsule 0  ? LORazepam (ATIVAN) 0.5 MG tablet Take 1 tablet (0.5 mg total) by mouth 2 (two) times daily as needed for anxiety. 30 tablet 0  ? melatonin 5 MG TABS Take 1 tablet (5 mg total) by mouth at bedtime. 30 tablet 0  ? mirtazapine (REMERON) 30 MG tablet Take 1 tablet (30 mg total) by mouth at bedtime. 30 tablet 3  ? propranolol (INDERAL) 10 MG tablet Take 1 tablet (10 mg total) by mouth 3 (three) times daily. 90 tablet 0  ? QUEtiapine (SEROQUEL) 100 MG tablet Take 1 tablet (100 mg total) by mouth at bedtime. 30 tablet 0  ? QUEtiapine (SEROQUEL) 25 MG tablet Take 1 tablet (25 mg total) by mouth 2 (two) times daily. 60 tablet 0  ? traZODone (DESYREL) 100 MG tablet Take 1 tablet (100 mg total) by mouth at bedtime. 30 tablet 0  ? venlafaxine XR (EFFEXOR-XR) 37.5 MG 24 hr capsule Take 3 capsules (112.5 mg total) by mouth daily with breakfast. 30 capsule 0  ? Vitamin D, Ergocalciferol, (DRISDOL) 1.25 MG (50000 UNIT) CAPS capsule Take 1 capsule (50,000 Units total) by mouth every 7 (seven) days. 5 capsule 0  ? ? ?Results for orders  placed or performed during the hospital encounter of 06/23/21 (from the past 48 hour(s))  ?Glucose, capillary     Status: None  ? Collection Time: 07/15/21  6:32 AM  ?Result Value Ref Range  ? Glucose-Capillary 78 70 - 99 mg/dL  ?  Comment: Glucose reference range applies only to samples taken after fasting for at least 8 hours.  ? ?No results found. ? ?Review of Systems  ?Constitutional: Negative.   ?HENT: Negative.    ?Eyes: Negative.   ?Respiratory: Negative.    ?Cardiovascular: Negative.   ?Gastrointestinal: Negative.   ?Musculoskeletal: Negative.   ?Skin: Negative.   ?Neurological: Negative.   ?Psychiatric/Behavioral: Negative.    ? ?Blood pressure 110/73, pulse 75, temperature 98.2 ?F (36.8 ?C), temperature source Oral, resp. rate 17, height '5\' 6"'$  (1.676 m), weight 76.2 kg, SpO2 99 %. ?Physical Exam ?Vitals and nursing note reviewed.  ?Constitutional:   ?   Appearance: He is well-developed.  ?HENT:  ?   Head: Normocephalic and atraumatic.  ?Eyes:  ?   Conjunctiva/sclera: Conjunctivae normal.  ?   Pupils: Pupils are equal, round, and reactive to light.  ?Cardiovascular:  ?   Heart sounds: Normal heart sounds.  ?Pulmonary:  ?   Effort: Pulmonary effort is normal.  ?Abdominal:  ?  Palpations: Abdomen is soft.  ?Musculoskeletal:     ?   General: Normal range of motion.  ?   Cervical back: Normal range of motion.  ?Skin: ?   General: Skin is warm and dry.  ?Neurological:  ?   General: No focal deficit present.  ?   Mental Status: He is alert.  ?Psychiatric:     ?   Mood and Affect: Mood normal.     ?   Thought Content: Thought content normal.  ?  ? ?Assessment/Plan ?Continue index as he improves ? ?Alethia Berthold, MD ?07/15/2021, 11:17 AM ? ? ? ?

## 2021-07-15 NOTE — Group Note (Signed)
[facility] LCSW Group Therapy Note ? ? ?Group Date: 07/15/2021 ?Start Time: 1300 ?End Time: 1400 ? ?Type of Therapy and Topic:  Group Therapy:  Feelings around Relapse and Recovery ? ?Participation Level:  Did Not Attend  ? ?Mood: ? ?Description of Group:   ? Patients in this group will discuss emotions they experience before and after a relapse. They will process how experiencing these feelings, or avoidance of experiencing them, relates to having a relapse. Facilitator will guide patients to explore emotions they have related to recovery. Patients will be encouraged to process which emotions are more powerful. They will be guided to discuss the emotional reaction significant others in their lives may have to patients? relapse or recovery. Patients will be assisted in exploring ways to respond to the emotions of others without this contributing to a relapse. ? ?Therapeutic Goals: ?Patient will identify two or more emotions that lead to relapse for them:  ?Patient will identify two emotions that result when they relapse:  ?Patient will identify two emotions related to recovery:  ?Patient will demonstrate ability to communicate their needs through discussion and/or role plays. ? ? ?Summary of Patient Progress: ? ?Patient was at ECT during group time.  ? ? ?Therapeutic Modalities:   ?Cognitive Behavioral Therapy ?Solution-Focused Therapy ?Assertiveness Training ?Relapse Prevention Therapy ? ? ?Durenda Hurt, LCSWA ?

## 2021-07-15 NOTE — Plan of Care (Signed)
D- Patient alert and oriented. Patient presents in a slightly depressed, but pleasant mood on assessment stating that he didn't sleep too well last night because of the dreams he was having, however, he had no complaints to voice to this writer at this time. Patient denied anxiety, but endorsed a little bit of depression, stating that overall he is feeling "so-so". Patient also denied SI, HI, AVH, and pain. Patient's goal for today is "getting through the day". ? ?A- Scheduled medications administered to patient, per MD orders. Support and encouragement provided.  Routine safety checks conducted every 15 minutes.  Patient informed to notify staff with problems or concerns. ? ?R- No adverse drug reactions noted. Patient contracts for safety at this time. Patient compliant with medications and treatment plan. Patient receptive, calm, and cooperative. Patient interacts well with others on the unit.  Patient remains safe at this time. ? ?Problem: Education: ?Goal: Utilization of techniques to improve thought processes will improve ?Outcome: Progressing ?Goal: Knowledge of the prescribed therapeutic regimen will improve ?Outcome: Progressing ?  ?Problem: Activity: ?Goal: Interest or engagement in leisure activities will improve ?Outcome: Progressing ?Goal: Imbalance in normal sleep/wake cycle will improve ?Outcome: Progressing ?  ?Problem: Coping: ?Goal: Coping ability will improve ?Description: PT IS PROGRESSING. ?Outcome: Progressing ?Goal: Will verbalize feelings ?Outcome: Progressing ?  ?Problem: Health Behavior/Discharge Planning: ?Goal: Ability to make decisions will improve ?Outcome: Progressing ?Goal: Compliance with therapeutic regimen will improve ?Outcome: Progressing ?  ?Problem: Role Relationship: ?Goal: Will demonstrate positive changes in social behaviors and relationships ?Outcome: Progressing ?  ?Problem: Safety: ?Goal: Ability to disclose and discuss suicidal ideas will improve ?Outcome:  Progressing ?Goal: Ability to identify and utilize support systems that promote safety will improve ?Outcome: Progressing ?  ?Problem: Self-Concept: ?Goal: Will verbalize positive feelings about self ?Outcome: Progressing ?Goal: Level of anxiety will decrease ?Outcome: Progressing ?  ?

## 2021-07-15 NOTE — Anesthesia Postprocedure Evaluation (Signed)
Anesthesia Post Note ? ?Patient: Casey Silva ? ?Procedure(s) Performed: ECT TX ? ?Patient location during evaluation: PACU ?Anesthesia Type: General ?Level of consciousness: awake and alert ?Pain management: pain level controlled ?Vital Signs Assessment: post-procedure vital signs reviewed and stable ?Respiratory status: spontaneous breathing, nonlabored ventilation, respiratory function stable and patient connected to nasal cannula oxygen ?Cardiovascular status: blood pressure returned to baseline and stable ?Postop Assessment: no apparent nausea or vomiting ?Anesthetic complications: no ? ? ?No notable events documented. ? ? ?Last Vitals:  ?Vitals:  ? 07/15/21 1300 07/15/21 1311  ?BP: (!) 125/100 (!) 128/92  ?Pulse: 78 78  ?Resp: 20 16  ?Temp: 36.9 ?C   ?SpO2: 99% 98%  ?  ?Last Pain:  ?Vitals:  ? 07/15/21 1311  ?TempSrc:   ?PainSc: 0-No pain  ? ? ?  ?  ?  ?  ?  ?  ? ?Arita Miss ? ? ? ? ?

## 2021-07-15 NOTE — Progress Notes (Signed)
Report given to [facility] RN prior to patient transfer.  ?

## 2021-07-15 NOTE — Progress Notes (Signed)
[facility] MD Progress Note ? ?07/15/2021 3:12 PM ?Casey Silva  ?MRN:  [id number] ?Subjective: Patient seen and chart reviewed.  Patient had ECT today which once again was well tolerated with an adequate type seizure.  He continues to show improved self-care with better hygiene and much more frequent interaction on the unit.  Coming out of his room and interacting more.  Admits that he is no longer having active suicidal thoughts. ?Principal Problem: Severe recurrent major depression with psychotic features ([place]) ?Diagnosis: Principal Problem: ?  Severe recurrent major depression with psychotic features ([place]) ?Active Problems: ?  Primary insomnia ?  GAD (generalized anxiety disorder) ?  Essential hypertension ? ?Total Time spent with patient: 30 minutes ? ?Past Psychiatric History: Past history of severe recurrent depression now starting to show response to ECT ? ?Past Medical History:  ?Past Medical History:  ?Diagnosis Date  ? Anxiety   ? Asthma   ? Depression   ? History reviewed. No pertinent surgical history. ?Family History:  ?Family History  ?Problem Relation Age of Onset  ? Bipolar disorder Mother   ? ?Family Psychiatric  History: History of bipolar disorder ?Social History:  ?Social History  ? ?Substance and Sexual Activity  ?Alcohol Use No  ?   ?Social History  ? ?Substance and Sexual Activity  ?Drug Use No  ?  ?Social History  ? ?Socioeconomic History  ? Marital status: Single  ?  Spouse name: Not on file  ? Number of children: 1  ? Years of education: Not on file  ? Highest education level: GED or equivalent  ?Occupational History  ? Not on file  ?Tobacco Use  ? Smoking status: Never  ? Smokeless tobacco: Never  ?Vaping Use  ? Vaping Use: Unknown  ?Substance and Sexual Activity  ? Alcohol use: No  ? Drug use: No  ? Sexual activity: Not Currently  ?Other Topics Concern  ? Not on file  ?Social History Narrative  ? Marital status:  Single   Children: Daughter   Employment:  Unemployment. previous work for [employer].   Alcohol:  None   Drugs:  none   Exercise:  Regularly very   Religion:  Jewish  ? ?Social Determinants of Health  ? ?Financial Resource Strain: Not on file  ?Food Insecurity: Not on file  ?Transportation Needs: Not on file  ?Physical Activity: Not on file  ?Stress: Not on file  ?Social Connections: Not on file  ? ?Additional Social History:  ?  ?  ?  ?  ?  ?  ?  ?  ?  ?  ?  ? ?Sleep: Fair ? ?Appetite:  Fair ? ?Current Medications: ?Current Facility-Administered Medications  ?Medication Dose Route Frequency Provider Last Rate Last Admin  ? 0.9 %  sodium chloride infusion  500 mL Intravenous Once Janayia Burggraf, [name], MD      ? acetaminophen (TYLENOL) tablet 650 mg  650 mg Oral Once PRN Darrin Nipper, MD      ? Or  ? acetaminophen (TYLENOL) 160 MG/5ML solution 325-650 mg  325-650 mg Oral Q4H PRN Darrin Nipper, MD      ? amLODipine (NORVASC) tablet 10 mg  10 mg Oral Daily Annabelle Rexroad, [name], MD   10 mg at 07/15/21 0865  ? diclofenac Sodium (VOLTAREN) 1 % topical gel 2 g  2 g Topical QID Emilyrose Darrah, [name], MD   2 g at 07/06/21 2125  ? feeding supplement (ENSURE ENLIVE / ENSURE PLUS) liquid 237 mL  237 mL Oral BID BM Naylani Bradner, [name], MD   237 mL at 07/14/21 1722  ? fentaNYL (SUBLIMAZE) injection 25 mcg  25 mcg Intravenous Q5 min PRN Molli Barrows, MD      ? gabapentin (NEURONTIN) capsule 100 mg  100 mg Oral TID Sameen Leas, [name], MD   100 mg at 07/15/21 1416  ? hydrOXYzine (ATARAX) tablet 50 mg  50 mg Oral Q6H PRN Pearla Mckinny, [name], MD   50 mg at 07/14/21 2103  ? ibuprofen (ADVIL) tablet 600 mg  600 mg Oral Q6H PRN Irvine Glorioso, [name], MD   600 mg at 06/27/21 0915  ? loperamide (IMODIUM) capsule 2 mg  2 mg Oral PRN Larita Fife, MD   2 mg at 07/03/21 1323  ? melatonin tablet 5 mg  5 mg Oral QHS Rosalee Tolley, [name], MD   5 mg at 07/14/21 2102  ? midazolam (VERSED) injection 2 mg  2 mg Intravenous Once Jocee Kissick T, MD      ? mirtazapine (REMERON) tablet 45 mg  45 mg Oral QHS Chade Pitner, [name], MD   45 mg at 07/14/21 2103  ?  multivitamin with minerals tablet 1 tablet  1 tablet Oral Daily Lucky Trotta, [name], MD   1 tablet at 07/15/21 4098  ? ondansetron (ZOFRAN) injection 4 mg  4 mg Intravenous Once PRN Arita Miss, MD      ? ondansetron (ZOFRAN-ODT) 4 MG disintegrating tablet           ? ondansetron (ZOFRAN-ODT) disintegrating tablet 4 mg  4 mg Oral Q4H PRN Jerel Sardina, [name], MD      ? QUEtiapine (SEROQUEL) tablet 100 mg  100 mg Oral QHS Laramie Meissner, [name], MD   100 mg at 07/14/21 2103  ? traZODone (DESYREL) tablet 100 mg  100 mg Oral QHS PRN Audie Stayer, [name], MD   100 mg at 07/14/21 2103  ? venlafaxine XR (EFFEXOR-XR) 24 hr capsule 150 mg  150 mg Oral Q breakfast Ayse Mccartin, [name], MD   150 mg at 07/15/21 0827  ? Vitamin D (Ergocalciferol) (DRISDOL) capsule 50,000 Units  50,000 Units Oral Q7 days Felicidad Sugarman, [name], MD   50,000 Units at 07/09/21 1847  ? ? ?Lab Results:  ?Results for orders placed or performed during the hospital encounter of 06/23/21 (from the past 48 hour(s))  ?Glucose, capillary     Status: None  ? Collection Time: 07/15/21  6:32 AM  ?Result Value Ref Range  ? Glucose-Capillary 78 70 - 99 mg/dL  ?  Comment: Glucose reference range applies only to samples taken after fasting for at least 8 hours.  ? ? ?Blood Alcohol level:  ?Lab Results  ?Component Value Date  ? ETH <10 06/07/2021  ? ETH <10 11/22/2020  ? ? ?Metabolic Disorder Labs: ?Lab Results  ?Component Value Date  ? HGBA1C 5.1 06/07/2021  ? MPG 99.67 06/07/2021  ? MPG 111.15 11/24/2020  ? ?No results found for: PROLACTIN ?Lab Results  ?Component Value Date  ? CHOL 197 06/07/2021  ? TRIG 149 06/07/2021  ? HDL 44 06/07/2021  ? CHOLHDL 4.5 06/07/2021  ? VLDL 30 06/07/2021  ? LDLCALC 123 (H) 06/07/2021  ? LDLCALC 148 (H) 11/24/2020  ? ? ?Physical Findings: ?AIMS:  , ,  ,  ,    ?CIWA:    ?COWS:    ? ?Musculoskeletal: ?Strength & Muscle Tone: within normal limits ?Gait & Station: normal ?Patient leans: N/A ? ?Psychiatric Specialty Exam: ? ?Presentation  ?General  Appearance:  Appropriate for Environment; Bizarre; Disheveled; Fairly Groomed ? ?Eye Contact:Good ? ?Speech:Clear and Coherent; Normal Rate ? ?Speech Volume:Normal ? ?Handedness:Right ? ? ?Mood and Affect  ?Mood:Anxious; Depressed; Hopeless; Irritable ? ?Affect:Blunt; Depressed ? ? ?Thought Process  ?Thought Processes:Coherent; Linear ? ?Descriptions of Associations:Intact ? ?Orientation:Full (Time, Place and Person) ? ?Thought Content:Logical; WDL ? ?History of Schizophrenia/Schizoaffective disorder:No ? ?Duration of Psychotic Symptoms:N/A ? ?Hallucinations:No data recorded ?Ideas of Reference:None ? ?Suicidal Thoughts:No data recorded ?Homicidal Thoughts:No data recorded ? ?Sensorium  ?Memory:Immediate Fair; Recent Fair; Remote Fair ? ?Judgment:Fair ? ?Insight:Fair ? ? ?Executive Functions  ?Concentration:Fair ? ?Attention Span:Good ? ?Recall:Good ? ?[place] ? ?Language:Good ? ? ?Psychomotor Activity  ?Psychomotor Activity:No data recorded ? ?Assets  ?Assets:Communication Skills; Physical Health; Social Support ? ? ?Sleep  ?Sleep:No data recorded ? ? ?Physical Exam: ?Physical Exam ?Vitals and nursing note reviewed.  ?Constitutional:   ?   Appearance: Normal appearance.  ?HENT:  ?   Head: Normocephalic and atraumatic.  ?   Mouth/Throat:  ?   Pharynx: Oropharynx is clear.  ?Eyes:  ?   Pupils: Pupils are equal, round, and reactive to light.  ?Cardiovascular:  ?   Rate and Rhythm: Normal rate and regular rhythm.  ?Pulmonary:  ?   Effort: Pulmonary effort is normal.  ?   Breath sounds: Normal breath sounds.  ?Abdominal:  ?   General: Abdomen is flat.  ?   Palpations: Abdomen is soft.  ?Musculoskeletal:     ?   General: Normal range of motion.  ?Skin: ?   General: Skin is warm and dry.  ?Neurological:  ?   General: No focal deficit present.  ?   Mental Status: He is alert. Mental status is at baseline.  ?Psychiatric:     ?   Attention and Perception: He is inattentive.     ?   Mood and Affect: Mood normal.     ?    Speech: Speech is delayed.     ?   Behavior: Behavior is slowed.     ?   Thought Content: Thought content normal. Thought content does not include suicidal ideation.     ?   Cognition and Memory: Memory is impaired.

## 2021-07-16 NOTE — Group Note (Signed)
LCSW Group Therapy Note ? ?Group Date: 07/16/2021 ?Start Time: 1310 ?End Time: 1410 ? ? ?Type of Therapy and Topic:  Group Therapy - Healthy vs Unhealthy Coping Skills ? ?Participation Level:  Did Not Attend  ? ?Description of Group ?The focus of this group was to determine what unhealthy coping techniques typically are used by group members and what healthy coping techniques would be helpful in coping with various problems. Patients were guided in becoming aware of the differences between healthy and unhealthy coping techniques. Patients were asked to identify 2-3 healthy coping skills they would like to learn to use more effectively. ? ?Therapeutic Goals ?Patients learned that coping is what human beings do all day long to deal with various situations in their lives ?Patients defined and discussed healthy vs unhealthy coping techniques ?Patients identified their preferred coping techniques and identified whether these were healthy or unhealthy ?Patients determined 2-3 healthy coping skills they would like to become more familiar with and use more often. ?Patients provided support and ideas to each other ? ? ?Summary of Patient Progress: Patient did not attend group despite encouraged participation. ? ? ?Therapeutic Modalities ?Cognitive Behavioral Therapy ?Motivational Interviewing ? ?Kenna Gilbert [place], LCSWA ?07/16/2021  3:16 PM   ?

## 2021-07-16 NOTE — Progress Notes (Signed)
Patient alert and oriented x 4, affect is flat but brightens upon approach, no distress noted S/P ECT he denies pain no confusion noted, he was seen interacting appropriately with peers and staff and complaint with medication regimen, 15 minutes safety checks maintained will continue to closely monitor  ?

## 2021-07-16 NOTE — Plan of Care (Signed)
?  Problem: Education: ?Goal: Utilization of techniques to improve thought processes will improve ?Outcome: Progressing ?Goal: Knowledge of the prescribed therapeutic regimen will improve ?Outcome: Progressing ?  ?Problem: Activity: ?Goal: Interest or engagement in leisure activities will improve ?Outcome: Progressing ?Goal: Imbalance in normal sleep/wake cycle will improve ?Outcome: Progressing ?  ?Problem: Coping: ?Goal: Coping ability will improve ?Description: PT IS PROGRESSING. ?Outcome: Progressing ?Goal: Will verbalize feelings ?Outcome: Progressing ?  ?Problem: Health Behavior/Discharge Planning: ?Goal: Ability to make decisions will improve ?Outcome: Progressing ?Goal: Compliance with therapeutic regimen will improve ?Outcome: Progressing ?  ?Problem: Role Relationship: ?Goal: Will demonstrate positive changes in social behaviors and relationships ?Outcome: Progressing ?  ?Problem: Safety: ?Goal: Ability to disclose and discuss suicidal ideas will improve ?Outcome: Progressing ?Goal: Ability to identify and utilize support systems that promote safety will improve ?Outcome: Progressing ?  ?Problem: Self-Concept: ?Goal: Will verbalize positive feelings about self ?Outcome: Progressing ?Goal: Level of anxiety will decrease ?Outcome: Progressing ?  ?

## 2021-07-16 NOTE — Progress Notes (Signed)
D: Pt alert and oriented. Pt rates depression 6/10, hopelessness 6/10, and anxiety 6/10.  Pt reports energy level as good and concentration as being good. Pt reports sleep last night as being good. Pt denies experiencing any pain at this time. Pt denies experiencing any SI/HI, or AVH at this time.  ? ?A: Scheduled medications administered to pt, per MD orders. Support and encouragement provided. Frequent verbal contact made. Routine safety checks conducted q15 minutes.  ? ?R: No adverse drug reactions noted. Pt verbally contracts for safety at this time. Pt complaint with medications and treatment plan. Pt interacts well with others on the unit. Pt remains safe at this time. Will continue to monitor.    ?

## 2021-07-16 NOTE — Progress Notes (Signed)
[facility] MD Progress Note ? ?07/16/2021 1:33 PM ?Casey Silva  ?MRN:  [id number] ?Subjective: Elta Casey Silva states that he is feeling better.  He denies any side effects from his medications and there is no evidence of EPS or TD.  He is no longer having suicidal thoughts.  Overall he feels better. ? ?Principal Problem: Severe recurrent major depression with psychotic features ([place]) ?Diagnosis: Principal Problem: ?  Severe recurrent major depression with psychotic features ([place]) ?Active Problems: ?  Primary insomnia ?  GAD (generalized anxiety disorder) ?  Essential hypertension ? ?Total Time spent with patient: 15 minutes ? ?Past Psychiatric History: See H&P ? ?Past Medical History:  ?Past Medical History:  ?Diagnosis Date  ? Anxiety   ? Asthma   ? Depression   ? History reviewed. No pertinent surgical history. ?Family History:  ?Family History  ?Problem Relation Age of Onset  ? Bipolar disorder Mother   ? ? ? ? ?Social History:  ?Social History  ? ?Substance and Sexual Activity  ?Alcohol Use No  ?   ?Social History  ? ?Substance and Sexual Activity  ?Drug Use No  ?  ?Social History  ? ?Socioeconomic History  ? Marital status: Single  ?  Spouse name: Not on file  ? Number of children: 1  ? Years of education: Not on file  ? Highest education level: GED or equivalent  ?Occupational History  ? Not on file  ?Tobacco Use  ? Smoking status: Never  ? Smokeless tobacco: Never  ?Vaping Use  ? Vaping Use: Unknown  ?Substance and Sexual Activity  ? Alcohol use: No  ? Drug use: No  ? Sexual activity: Not Currently  ?Other Topics Concern  ? Not on file  ?Social History Narrative  ? Marital status:  Single   Children: Daughter   Employment:  Unemployment. previous work for [employer].   Alcohol:  None   Drugs:  none   Exercise:  Regularly very   Religion:  Jewish  ? ?Social Determinants of Health  ? ?Financial Resource Strain: Not on file  ?Food Insecurity: Not on file  ?Transportation Needs: Not on file  ?Physical Activity: Not on file   ?Stress: Not on file  ?Social Connections: Not on file  ? ?Additional Social History:  ?  ?  ?  ?  ?  ?  ?  ?  ?  ?  ?  ? ?Sleep: Good ? ?Appetite:  Good ? ?Current Medications: ?Current Facility-Administered Medications  ?Medication Dose Route Frequency Provider Last Rate Last Admin  ? 0.9 %  sodium chloride infusion  500 mL Intravenous Once Clapacs, [name], MD      ? acetaminophen (TYLENOL) tablet 650 mg  650 mg Oral Once PRN Darrin Nipper, MD      ? Or  ? acetaminophen (TYLENOL) 160 MG/5ML solution 325-650 mg  325-650 mg Oral Q4H PRN Darrin Nipper, MD      ? amLODipine (NORVASC) tablet 10 mg  10 mg Oral Daily Clapacs, [name], MD   10 mg at 07/16/21 0900  ? diclofenac Sodium (VOLTAREN) 1 % topical gel 2 g  2 g Topical QID Clapacs, [name], MD   2 g at 07/06/21 2125  ? feeding supplement (ENSURE ENLIVE / ENSURE PLUS) liquid 237 mL  237 mL Oral BID BM Clapacs, [name], MD   237 mL at 07/16/21 1030  ? fentaNYL (SUBLIMAZE) injection 25 mcg  25 mcg Intravenous Q5 min PRN Molli Barrows, MD      ?  hydrOXYzine (ATARAX) tablet 50 mg  50 mg Oral Q6H PRN Clapacs, [name], MD   50 mg at 07/15/21 2127  ? ibuprofen (ADVIL) tablet 600 mg  600 mg Oral Q6H PRN Clapacs, [name], MD   600 mg at 06/27/21 0915  ? loperamide (IMODIUM) capsule 2 mg  2 mg Oral PRN Larita Fife, MD   2 mg at 07/03/21 1323  ? melatonin tablet 5 mg  5 mg Oral QHS Clapacs, [name], MD   5 mg at 07/15/21 2127  ? midazolam (VERSED) injection 2 mg  2 mg Intravenous Once Clapacs, [name], MD      ? mirtazapine (REMERON) tablet 45 mg  45 mg Oral QHS Clapacs, [name], MD   45 mg at 07/15/21 2127  ? multivitamin with minerals tablet 1 tablet  1 tablet Oral Daily Clapacs, [name], MD   1 tablet at 07/16/21 0900  ? ondansetron (ZOFRAN) injection 4 mg  4 mg Intravenous Once PRN Arita Miss, MD      ? ondansetron (ZOFRAN-ODT) disintegrating tablet 4 mg  4 mg Oral Q4H PRN Clapacs, [name], MD      ? QUEtiapine (SEROQUEL) tablet 100 mg  100 mg Oral QHS Clapacs, [name], MD    100 mg at 07/15/21 2127  ? traZODone (DESYREL) tablet 100 mg  100 mg Oral QHS PRN Clapacs, [name], MD   100 mg at 07/15/21 2127  ? venlafaxine XR (EFFEXOR-XR) 24 hr capsule 150 mg  150 mg Oral Q breakfast Clapacs, [name], MD   150 mg at 07/16/21 0900  ? Vitamin D (Ergocalciferol) (DRISDOL) capsule 50,000 Units  50,000 Units Oral Q7 days Clapacs, [name], MD   50,000 Units at 07/09/21 1847  ? ? ?Lab Results:  ?Results for orders placed or performed during the hospital encounter of 06/23/21 (from the past 48 hour(s))  ?Glucose, capillary     Status: None  ? Collection Time: 07/15/21  6:32 AM  ?Result Value Ref Range  ? Glucose-Capillary 78 70 - 99 mg/dL  ?  Comment: Glucose reference range applies only to samples taken after fasting for at least 8 hours.  ? ? ?Blood Alcohol level:  ?Lab Results  ?Component Value Date  ? ETH <10 06/07/2021  ? ETH <10 11/22/2020  ? ? ?Metabolic Disorder Labs: ?Lab Results  ?Component Value Date  ? HGBA1C 5.1 06/07/2021  ? MPG 99.67 06/07/2021  ? MPG 111.15 11/24/2020  ? ?No results found for: PROLACTIN ?Lab Results  ?Component Value Date  ? CHOL 197 06/07/2021  ? TRIG 149 06/07/2021  ? HDL 44 06/07/2021  ? CHOLHDL 4.5 06/07/2021  ? VLDL 30 06/07/2021  ? LDLCALC 123 (H) 06/07/2021  ? LDLCALC 148 (H) 11/24/2020  ? ? ?Physical Findings: ?AIMS:  , ,  ,  ,    ?CIWA:    ?COWS:    ? ?Musculoskeletal: ?Strength & Muscle Tone: within normal limits ?Gait & Station: normal ?Patient leans: N/A ? ?Psychiatric Specialty Exam: ? ?Presentation  ?General Appearance: Appropriate for Environment; Bizarre; Disheveled; Fairly Groomed ? ?Eye Contact:Good ? ?Speech:Clear and Coherent; Normal Rate ? ?Speech Volume:Normal ? ?Handedness:Right ? ? ?Mood and Affect  ?Mood:Anxious; Depressed; Hopeless; Irritable ? ?Affect:Blunt; Depressed ? ? ?Thought Process  ?Thought Processes:Coherent; Linear ? ?Descriptions of Associations:Intact ? ?Orientation:Full (Time, Place and Person) ? ?Thought Content:Logical;  WDL ? ?History of Schizophrenia/Schizoaffective disorder:No ? ?Duration of Psychotic Symptoms:N/A ? ?Hallucinations:No data recorded ?Ideas of Reference:None ? ?Suicidal Thoughts:No data recorded ?  Homicidal Thoughts:No data recorded ? ?Sensorium  ?Memory:Immediate Fair; Recent Fair; Remote Fair ? ?Judgment:Fair ? ?Insight:Fair ? ? ?Executive Functions  ?Concentration:Fair ? ?Attention Span:Good ? ?Recall:Good ? ?[place] ? ?Language:Good ? ? ?Psychomotor Activity  ?Psychomotor Activity:No data recorded ? ?Assets  ?Assets:Communication Skills; Physical Health; Social Support ? ? ?Sleep  ?Sleep:No data recorded ? ? ?Physical Exam: ?Physical Exam ?Vitals and nursing note reviewed.  ?Constitutional:   ?   Appearance: Normal appearance. He is normal weight.  ?Neurological:  ?   General: No focal deficit present.  ?   Mental Status: He is alert and oriented to person, place, and time.  ?Psychiatric:     ?   Mood and Affect: Mood normal.     ?   Behavior: Behavior normal.  ? ?Review of Systems  ?Constitutional: Negative.   ?HENT: Negative.    ?Eyes: Negative.   ?Respiratory: Negative.    ?Cardiovascular: Negative.   ?Gastrointestinal: Negative.   ?Genitourinary: Negative.   ?Musculoskeletal: Negative.   ?Skin: Negative.   ?Neurological: Negative.   ?Endo/Heme/Allergies: Negative.   ?Psychiatric/Behavioral: Negative.    ?Blood pressure 113/68, pulse 67, temperature 98 ?F (36.7 ?C), temperature source Oral, resp. rate 18, height '5\' 6"'$  (1.676 m), weight 76.2 kg, SpO2 100 %. Body mass index is 27.12 kg/m?. ? ? ?Treatment Plan Summary: ?Daily contact with patient to assess and evaluate symptoms and progress in treatment, Medication management, and Plan continue current medications. ? ?Parks Ranger, DO ?07/16/2021, 1:33 PM ? ?

## 2021-07-17 NOTE — Group Note (Signed)
[place] LCSW Group Therapy Note ? ? ?Group Date: 07/17/2021 ?Start Time: 1310 ?End Time: 1410 ? ? ?Type of Therapy/Topic:  Group Therapy:  Emotion Regulation ? ?Participation Level:  Did Not Attend  ? ?Mood: ? ?Description of Group:   ? The purpose of this group is to assist patients in learning to regulate negative emotions and experience positive emotions. Patients will be guided to discuss ways in which they have been vulnerable to their negative emotions. These vulnerabilities will be juxtaposed with experiences of positive emotions or situations, and patients challenged to use positive emotions to combat negative ones. Special emphasis will be placed on coping with negative emotions in conflict situations, and patients will process healthy conflict resolution skills. ? ?Therapeutic Goals: ?Patient will identify two positive emotions or experiences to reflect on in order to balance out negative emotions:  ?Patient will label two or more emotions that they find the most difficult to experience:  ?Patient will be able to demonstrate positive conflict resolution skills through discussion or role plays:  ? ?Summary of Patient Progress: Due to limited staffing, group was not held on the unit.  ? ? ? ?Therapeutic Modalities:   ?Cognitive Behavioral Therapy ?Feelings Identification ?Dialectical Behavioral Therapy ? ? ?Kenna Gilbert [place], LCSWA ?

## 2021-07-17 NOTE — Progress Notes (Signed)
Pt denies SI/HI, visual or auditory hallucinations throughout shift. When asked about any feelings of depression or anxiety pt states "I feel pretty level". Pt remained calm and cooperative, willingly socializing with other patients and staff in the day room. Pt spent time outdoors with group. Pt denies any pain, when attempting to admin voltaren pt declined stating "I don't need it right now" throughout shift. Q15 min safety checks were maintained throughout shift and pt remains safe on unit at this time.  ?

## 2021-07-17 NOTE — Progress Notes (Signed)
[facility] MD Progress Note ? ?07/17/2021 1:16 PM ?Casey Silva  ?MRN:  [id number] ?Subjective: Casey Silva is seen on rounds.  I asked him about his suicidal thoughts and he said not so much.  He denies any side effects from his medications.  He has no issues today. ? ?Principal Problem: Severe recurrent major depression with psychotic features ([place]) ?Diagnosis: Principal Problem: ?  Severe recurrent major depression with psychotic features ([place]) ?Active Problems: ?  Primary insomnia ?  GAD (generalized anxiety disorder) ?  Essential hypertension ? ?Total Time spent with patient: 15 minutes ? ?Past Psychiatric History: See H&P ? ?Past Medical History:  ?Past Medical History:  ?Diagnosis Date  ? Anxiety   ? Asthma   ? Depression   ? History reviewed. No pertinent surgical history. ?Family History:  ?Family History  ?Problem Relation Age of Onset  ? Bipolar disorder Mother   ? ? ? ? ?Social History:  ?Social History  ? ?Substance and Sexual Activity  ?Alcohol Use No  ?   ?Social History  ? ?Substance and Sexual Activity  ?Drug Use No  ?  ?Social History  ? ?Socioeconomic History  ? Marital status: Single  ?  Spouse name: Not on file  ? Number of children: 1  ? Years of education: Not on file  ? Highest education level: GED or equivalent  ?Occupational History  ? Not on file  ?Tobacco Use  ? Smoking status: Never  ? Smokeless tobacco: Never  ?Vaping Use  ? Vaping Use: Unknown  ?Substance and Sexual Activity  ? Alcohol use: No  ? Drug use: No  ? Sexual activity: Not Currently  ?Other Topics Concern  ? Not on file  ?Social History Narrative  ? Marital status:  Single   Children: Daughter   Employment:  Unemployment. previous work for [employer].   Alcohol:  None   Drugs:  none   Exercise:  Regularly very   Religion:  Jewish  ? ?Social Determinants of Health  ? ?Financial Resource Strain: Not on file  ?Food Insecurity: Not on file  ?Transportation Needs: Not on file  ?Physical Activity: Not on file  ?Stress: Not on file  ?Social  Connections: Not on file  ? ?Additional Social History:  ?  ?  ?  ?  ?  ?  ?  ?  ?  ?  ?  ? ?Sleep: Good ? ?Appetite:  Good ? ?Current Medications: ?Current Facility-Administered Medications  ?Medication Dose Route Frequency Provider Last Rate Last Admin  ? 0.9 %  sodium chloride infusion  500 mL Intravenous Once Clapacs, [name], MD      ? acetaminophen (TYLENOL) tablet 650 mg  650 mg Oral Once PRN Darrin Nipper, MD      ? Or  ? acetaminophen (TYLENOL) 160 MG/5ML solution 325-650 mg  325-650 mg Oral Q4H PRN Darrin Nipper, MD      ? amLODipine (NORVASC) tablet 10 mg  10 mg Oral Daily Clapacs, [name], MD   10 mg at 07/17/21 0854  ? diclofenac Sodium (VOLTAREN) 1 % topical gel 2 g  2 g Topical QID Clapacs, [name], MD   2 g at 07/06/21 2125  ? feeding supplement (ENSURE ENLIVE / ENSURE PLUS) liquid 237 mL  237 mL Oral BID BM Clapacs, [name], MD   237 mL at 07/17/21 0900  ? fentaNYL (SUBLIMAZE) injection 25 mcg  25 mcg Intravenous Q5 min PRN Molli Barrows, MD      ?  hydrOXYzine (ATARAX) tablet 50 mg  50 mg Oral Q6H PRN Clapacs, [name], MD   50 mg at 07/15/21 2127  ? ibuprofen (ADVIL) tablet 600 mg  600 mg Oral Q6H PRN Clapacs, [name], MD   600 mg at 06/27/21 0915  ? loperamide (IMODIUM) capsule 2 mg  2 mg Oral PRN Larita Fife, MD   2 mg at 07/03/21 1323  ? melatonin tablet 5 mg  5 mg Oral QHS Clapacs, [name], MD   5 mg at 07/16/21 2100  ? midazolam (VERSED) injection 2 mg  2 mg Intravenous Once Clapacs, [name], MD      ? mirtazapine (REMERON) tablet 45 mg  45 mg Oral QHS Clapacs, [name], MD   45 mg at 07/16/21 2100  ? multivitamin with minerals tablet 1 tablet  1 tablet Oral Daily Clapacs, [name], MD   1 tablet at 07/17/21 0853  ? ondansetron (ZOFRAN) injection 4 mg  4 mg Intravenous Once PRN Arita Miss, MD      ? ondansetron (ZOFRAN-ODT) disintegrating tablet 4 mg  4 mg Oral Q4H PRN Clapacs, [name], MD      ? QUEtiapine (SEROQUEL) tablet 100 mg  100 mg Oral QHS Clapacs, [name], MD   100 mg at 07/16/21 2100  ?  traZODone (DESYREL) tablet 100 mg  100 mg Oral QHS PRN Clapacs, [name], MD   100 mg at 07/16/21 2316  ? venlafaxine XR (EFFEXOR-XR) 24 hr capsule 150 mg  150 mg Oral Q breakfast Clapacs, [name], MD   150 mg at 07/17/21 0853  ? Vitamin D (Ergocalciferol) (DRISDOL) capsule 50,000 Units  50,000 Units Oral Q7 days Clapacs, [name], MD   50,000 Units at 07/16/21 1648  ? ? ?Lab Results: No results found for this or any previous visit (from the past 48 hour(s)). ? ?Blood Alcohol level:  ?Lab Results  ?Component Value Date  ? ETH <10 06/07/2021  ? ETH <10 11/22/2020  ? ? ?Metabolic Disorder Labs: ?Lab Results  ?Component Value Date  ? HGBA1C 5.1 06/07/2021  ? MPG 99.67 06/07/2021  ? MPG 111.15 11/24/2020  ? ?No results found for: PROLACTIN ?Lab Results  ?Component Value Date  ? CHOL 197 06/07/2021  ? TRIG 149 06/07/2021  ? HDL 44 06/07/2021  ? CHOLHDL 4.5 06/07/2021  ? VLDL 30 06/07/2021  ? LDLCALC 123 (H) 06/07/2021  ? LDLCALC 148 (H) 11/24/2020  ? ? ?Physical Findings: ?AIMS:  , ,  ,  ,    ?CIWA:    ?COWS:    ? ?Musculoskeletal: ?Strength & Muscle Tone: within normal limits ?Gait & Station: normal ?Patient leans: N/A ? ?Psychiatric Specialty Exam: ? ?Presentation  ?General Appearance: Appropriate for Environment; Bizarre; Disheveled; Fairly Groomed ? ?Eye Contact:Good ? ?Speech:Clear and Coherent; Normal Rate ? ?Speech Volume:Normal ? ?Handedness:Right ? ? ?Mood and Affect  ?Mood:Anxious; Depressed; Hopeless; Irritable ? ?Affect:Blunt; Depressed ? ? ?Thought Process  ?Thought Processes:Coherent; Linear ? ?Descriptions of Associations:Intact ? ?Orientation:Full (Time, Place and Person) ? ?Thought Content:Logical; WDL ? ?History of Schizophrenia/Schizoaffective disorder:No ? ?Duration of Psychotic Symptoms:N/A ? ?Hallucinations:No data recorded ?Ideas of Reference:None ? ?Suicidal Thoughts:No data recorded ?Homicidal Thoughts:No data recorded ? ?Sensorium  ?Memory:Immediate Fair; Recent Fair; Remote  Fair ? ?Judgment:Fair ? ?Insight:Fair ? ? ?Executive Functions  ?Concentration:Fair ? ?Attention Span:Good ? ?Recall:Good ? ?[place] ? ?Language:Good ? ? ?Psychomotor Activity  ?Psychomotor Activity:No data recorded ? ?Assets  ?Assets:Communication Skills; Physical Health; Social Support ? ? ?Sleep  ?  Sleep:No data recorded ? ? ?Physical Exam: ?Physical Exam ?Vitals and nursing note reviewed.  ?Constitutional:   ?   Appearance: Normal appearance. He is normal weight.  ?Neurological:  ?   General: No focal deficit present.  ?   Mental Status: He is alert and oriented to person, place, and time.  ?Psychiatric:     ?   Mood and Affect: Mood normal.     ?   Behavior: Behavior normal.  ? ?Review of Systems  ?Constitutional: Negative.   ?HENT: Negative.    ?Eyes: Negative.   ?Respiratory: Negative.    ?Cardiovascular: Negative.   ?Gastrointestinal: Negative.   ?Genitourinary: Negative.   ?Musculoskeletal: Negative.   ?Skin: Negative.   ?Neurological: Negative.   ?Endo/Heme/Allergies: Negative.   ?Psychiatric/Behavioral: Negative.    ?Blood pressure 133/86, pulse 73, temperature 98.5 ?F (36.9 ?C), temperature source Oral, resp. rate 18, height '5\' 6"'$  (1.676 m), weight 76.2 kg, SpO2 98 %. Body mass index is 27.12 kg/m?. ? ? ?Treatment Plan Summary: ?Daily contact with patient to assess and evaluate symptoms and progress in treatment, Medication management, and Plan continue current medications. ? ?Parks Ranger, DO ?07/17/2021, 1:16 PM ? ?

## 2021-07-18 ENCOUNTER — Other Ambulatory Visit: Payer: Self-pay | Admitting: Psychiatry

## 2021-07-18 ENCOUNTER — Encounter: Payer: Self-pay | Admitting: Psychiatry

## 2021-07-18 ENCOUNTER — Inpatient Hospital Stay: Payer: 59 | Admitting: Certified Registered Nurse Anesthetist

## 2021-07-18 ENCOUNTER — Ambulatory Visit: Payer: 59

## 2021-07-18 LAB — GLUCOSE, CAPILLARY: Glucose-Capillary: 92 mg/dL (ref 70–99)

## 2021-07-18 MED ORDER — LABETALOL HCL 5 MG/ML IV SOLN
INTRAVENOUS | Status: DC | PRN
  Administered 2021-07-18: 10 mg via INTRAVENOUS

## 2021-07-18 MED ORDER — SUCCINYLCHOLINE CHLORIDE 200 MG/10ML IV SOSY
PREFILLED_SYRINGE | INTRAVENOUS | Status: DC | PRN
  Administered 2021-07-18: 100 mg via INTRAVENOUS

## 2021-07-18 MED ORDER — KETAMINE HCL 50 MG/5ML IJ SOSY
PREFILLED_SYRINGE | INTRAMUSCULAR | Status: AC
  Filled 2021-07-18: qty 5

## 2021-07-18 MED ORDER — SODIUM CHLORIDE 0.9 % IV SOLN: 500.0000 mL | Freq: Once | INTRAVENOUS | Status: DC

## 2021-07-18 MED ORDER — KETAMINE HCL 10 MG/ML IJ SOLN
INTRAMUSCULAR | Status: DC | PRN
  Administered 2021-07-18: 100 mg via INTRAVENOUS

## 2021-07-18 MED ORDER — SUCCINYLCHOLINE CHLORIDE 200 MG/10ML IV SOSY
PREFILLED_SYRINGE | INTRAVENOUS | Status: AC
  Filled 2021-07-18: qty 10

## 2021-07-18 MED ORDER — ONDANSETRON HCL 4 MG/2ML IJ SOLN: 4.0000 mg | Freq: Once | INTRAMUSCULAR | Status: DC

## 2021-07-18 MED ORDER — LABETALOL HCL 5 MG/ML IV SOLN
INTRAVENOUS | Status: AC
  Filled 2021-07-18: qty 4

## 2021-07-18 NOTE — Anesthesia Preprocedure Evaluation (Signed)
Anesthesia Evaluation  ?Patient identified by MRN, date of birth, ID band ?Patient awake ? ? ? ?Reviewed: ?Allergy & Precautions, NPO status , Patient's Chart, lab work & pertinent test results ? ?History of Anesthesia Complications ?Negative for: history of anesthetic complications ? ?Airway ?Mallampati: III ? ? ?Neck ROM: Full ? ? ? Dental ? ? ?Missing all upper teeth except 3:   ?Pulmonary ?asthma , COPD,  ?  ?Pulmonary exam normal ?breath sounds clear to auscultation ? ? ? ? ? ? Cardiovascular ?hypertension, Normal cardiovascular exam ?Rhythm:Regular Rate:Normal ? ?ECG 06/23/21: normal ?  ?Neuro/Psych ?PSYCHIATRIC DISORDERS Anxiety Depression negative neurological ROS ?   ? GI/Hepatic ?negative GI ROS,   ?Endo/Other  ?negative endocrine ROS ? Renal/GU ?negative Renal ROS  ? ?  ?Musculoskeletal ? ? Abdominal ?  ?Peds ? Hematology ?negative hematology ROS ?(+)   ?Anesthesia Other Findings ? ? Reproductive/Obstetrics ? ?  ? ? ? ? ? ? ? ? ? ? ? ? ? ?  ?  ? ? ? ? ? ? ? ? ?Anesthesia Physical ? ?Anesthesia Plan ? ?ASA: 3 ? ?Anesthesia Plan: General  ? ?Post-op Pain Management:   ? ?Induction: Intravenous ? ?PONV Risk Score and Plan: 2 and TIVA and Treatment may vary due to age or medical condition ? ?Airway Management Planned: Natural Airway ? ?Additional Equipment:  ? ?Intra-op Plan:  ? ?Post-operative Plan:  ? ?Informed Consent: I have reviewed the patients History and Physical, chart, labs and discussed the procedure including the risks, benefits and alternatives for the proposed anesthesia with the patient or authorized representative who has indicated his/her understanding and acceptance.  ? ? ? ? ? ?Plan Discussed with: CRNA ? ?Anesthesia Plan Comments: (LMA/GETA backup discussed.  Serial consent on chart.  Patient consented for risks of anesthesia including but not limited to:  ?- adverse reactions to medications ?- damage to eyes, teeth, lips or other oral mucosa ?- nerve  damage due to positioning  ?- sore throat or hoarseness ?- damage to heart, brain, nerves, lungs, other parts of body or loss of life ? ?Informed patient about role of CRNA in peri- and intra-operative care.  Patient voiced understanding.)  ? ? ? ? ? ? ?Anesthesia Quick Evaluation ? ?

## 2021-07-18 NOTE — Group Note (Signed)
[place] LCSW Group Therapy Note ? ? ? ?Group Date: 07/18/2021 ?Start Time: 1330 ?End Time: 1430 ? ?Type of Therapy and Topic:  Group Therapy:  Overcoming Obstacles ? ?Participation Level:  [facility] PARTICIPATION LEVEL: Did Not Attend ? ?Mood: ? ?Description of Group:   ?In this group patients will be encouraged to explore what they see as obstacles to their own wellness and recovery. They will be guided to discuss their thoughts, feelings, and behaviors related to these obstacles. The group will process together ways to cope with barriers, with attention given to specific choices patients can make. Each patient will be challenged to identify changes they are motivated to make in order to overcome their obstacles. This group will be process-oriented, with patients participating in exploration of their own experiences as well as giving and receiving support and challenge from other group members. ? ?Therapeutic Goals: ?1. Patient will identify personal and current obstacles as they relate to admission. ?2. Patient will identify barriers that currently interfere with their wellness or overcoming obstacles.  ?3. Patient will identify feelings, thought process and behaviors related to these barriers. ?4. Patient will identify two changes they are willing to make to overcome these obstacles:  ? ? ?Summary of Patient Progress ? ? ?Patient did not attend group despite encouraged participation.  ? ? ?Therapeutic Modalities:   ?Cognitive Behavioral Therapy ?Solution Focused Therapy ?Motivational Interviewing ?Relapse Prevention Therapy ? ? ?Durenda Hurt, LCSWA ?

## 2021-07-18 NOTE — Procedures (Signed)
ECT SERVICES ?Physician?s Interval Evaluation & Treatment Note ? ?Patient Identification: Casey Silva ?MRN:  [id number] ?Date of Evaluation:  07/18/2021 ?[place] #: 10 no change to physical exam ? ?MADRS:  ? ?MMSE:  ? ?P.E. Findings: ? ?No change to physical exam ? ?Psychiatric Interval Note: ? ?Mood continues to appear to be somewhat improved although he is certainly having significant memory problems now. ? ?Subjective: ? ?Patient is a 66 y.o. male seen for evaluation for Electroconvulsive Therapy. ?No new complaint ? ?Treatment Summary: ? ? '[]'$   Right Unilateral             '[x]'$  Bilateral ?  ?% Energy : 1.0 ms 100% ? ? Impedance: 1330 ohms ? ?Seizure Energy Index: 1997 ?V squared ? ?Postictal Suppression Index: 70% ? ?Seizure Concordance Index: 91% ? ?Medications ? ?Pre Shock: Ketamine 100 mg Zofran 4 mg succinylcholine 120 mg ? ?Post Shock:   ? ?Seizure Duration: 26 seconds EMG 40 seconds EEG ? ? ?Comments: ?Once again in another good quality seizure.  Anticipate continued mood stability and improvement but stopping the index course now because of memory problems and plateau of improvement ? ?Lungs: ? ?'[x]'$   Clear to auscultation              ? ?'[]'$  Other:  ? ?Heart: ?  ? '[x]'$   Regular rhythm             '[]'$  irregular rhythm ? ? ? '[x]'$   Previous H&P reviewed, patient examined and there are NO CHANGES              ?  ? '[]'$   Previous H&P reviewed, patient examined and there are changes noted. ? ? ?Alethia Berthold, MD ?4/10/20232:17 PM ? ?  ?

## 2021-07-18 NOTE — Progress Notes (Signed)
Patient ID: Casey Silva, male   DOB: [birth year], 66 y.o.   MRN: [id number] ?Patient back from ECT. Patient is resting quietly. Patient did not receive Ensures as he was NPO earlier today; did not have an ensure to give to patient at 1500. Patient continues to rest post ECT.  ?

## 2021-07-18 NOTE — Progress Notes (Signed)
[facility] MD Progress Note ? ?07/18/2021 2:15 PM ?[name]  ?MRN:  [id number] ?Subjective: Follow-up for this patient with severe major depression.  Patient seen and chart reviewed.  Patient again reports that his mood is better.  Not "great" but also not terrible.  Not having any suicidal thoughts.  Patient was clearly very impaired by memory problems today confused about where he was and exactly what was going on but when prompted was able to follow the conversation and regain some memory ?Principal Problem: Severe recurrent major depression with psychotic features ([place]) ?Diagnosis: Principal Problem: ?  Severe recurrent major depression with psychotic features ([place]) ?Active Problems: ?  Primary insomnia ?  GAD (generalized anxiety disorder) ?  Essential hypertension ? ?Total Time spent with patient: 30 minutes ? ?Past Psychiatric History: Past history of recurrent severe depression ? ?Past Medical History:  ?Past Medical History:  ?Diagnosis Date  ? Anxiety   ? Asthma   ? Depression   ? History reviewed. No pertinent surgical history. ?Family History:  ?Family History  ?Problem Relation Age of Onset  ? Bipolar disorder Mother   ? ?Family Psychiatric  History: See previous ?Social History:  ?Social History  ? ?Substance and Sexual Activity  ?Alcohol Use No  ?   ?Social History  ? ?Substance and Sexual Activity  ?Drug Use No  ?  ?Social History  ? ?Socioeconomic History  ? Marital status: Single  ?  Spouse name: Not on file  ? Number of children: 1  ? Years of education: Not on file  ? Highest education level: GED or equivalent  ?Occupational History  ? Not on file  ?Tobacco Use  ? Smoking status: Never  ? Smokeless tobacco: Never  ?Vaping Use  ? Vaping Use: Unknown  ?Substance and Sexual Activity  ? Alcohol use: No  ? Drug use: No  ? Sexual activity: Not Currently  ?Other Topics Concern  ? Not on file  ?Social History Narrative  ? Marital status:  Single   Children: Daughter   Employment:  Unemployment. previous work  for [employer].   Alcohol:  None   Drugs:  none   Exercise:  Regularly very   Religion:  Jewish  ? ?Social Determinants of Health  ? ?Financial Resource Strain: Not on file  ?Food Insecurity: Not on file  ?Transportation Needs: Not on file  ?Physical Activity: Not on file  ?Stress: Not on file  ?Social Connections: Not on file  ? ?Additional Social History:  ?  ?  ?  ?  ?  ?  ?  ?  ?  ?  ?  ? ?Sleep: Fair ? ?Appetite:  Fair ? ?Current Medications: ?Current Facility-Administered Medications  ?Medication Dose Route Frequency Provider Last Rate Last Admin  ? 0.9 %  sodium chloride infusion  500 mL Intravenous Once Tremar Wickens, [name], MD      ? acetaminophen (TYLENOL) tablet 650 mg  650 mg Oral Once PRN Darrin Nipper, MD      ? Or  ? acetaminophen (TYLENOL) 160 MG/5ML solution 325-650 mg  325-650 mg Oral Q4H PRN Darrin Nipper, MD      ? amLODipine (NORVASC) tablet 10 mg  10 mg Oral Daily Ruffus Kamaka, [name], MD   10 mg at 07/18/21 0102  ? diclofenac Sodium (VOLTAREN) 1 % topical gel 2 g  2 g Topical QID Hershall Benkert, [name], MD   2 g at 07/18/21 1356  ? feeding supplement (ENSURE ENLIVE / ENSURE PLUS) liquid 237  mL  237 mL Oral BID BM Oliverio Cho, [name], MD   237 mL at 07/17/21 1620  ? fentaNYL (SUBLIMAZE) injection 25 mcg  25 mcg Intravenous Q5 min PRN Molli Barrows, MD      ? hydrOXYzine (ATARAX) tablet 50 mg  50 mg Oral Q6H PRN Butler Vegh, [name], MD   50 mg at 07/17/21 2139  ? ibuprofen (ADVIL) tablet 600 mg  600 mg Oral Q6H PRN Malvern Kadlec, [name], MD   600 mg at 06/27/21 0915  ? loperamide (IMODIUM) capsule 2 mg  2 mg Oral PRN Larita Fife, MD   2 mg at 07/03/21 1323  ? melatonin tablet 5 mg  5 mg Oral QHS Ruston Fedora, [name], MD   5 mg at 07/17/21 2139  ? midazolam (VERSED) injection 2 mg  2 mg Intravenous Once Christyna Letendre T, MD      ? mirtazapine (REMERON) tablet 45 mg  45 mg Oral QHS Biddie Sebek, [name], MD   45 mg at 07/17/21 2139  ? multivitamin with minerals tablet 1 tablet  1 tablet Oral Daily Jenay Morici, [name], MD   1  tablet at 07/18/21 1356  ? ondansetron (ZOFRAN) injection 4 mg  4 mg Intravenous Once PRN Arita Miss, MD      ? ondansetron [facility]) injection 4 mg  4 mg Intravenous Once Mariza Bourget, [name], MD      ? ondansetron (ZOFRAN-ODT) disintegrating tablet 4 mg  4 mg Oral Q4H PRN Airyn Ellzey, [name], MD      ? QUEtiapine (SEROQUEL) tablet 100 mg  100 mg Oral QHS Takeo Harts, [name], MD   100 mg at 07/17/21 2139  ? traZODone (DESYREL) tablet 100 mg  100 mg Oral QHS PRN Rhyatt Muska, [name], MD   100 mg at 07/16/21 2316  ? venlafaxine XR (EFFEXOR-XR) 24 hr capsule 150 mg  150 mg Oral Q breakfast Durrel Mcnee, [name], MD   150 mg at 07/18/21 1357  ? Vitamin D (Ergocalciferol) (DRISDOL) capsule 50,000 Units  50,000 Units Oral Q7 days Rashika Bettes, [name], MD   50,000 Units at 07/16/21 1648  ? ? ?Lab Results:  ?Results for orders placed or performed during the hospital encounter of 06/23/21 (from the past 48 hour(s))  ?Glucose, capillary     Status: None  ? Collection Time: 07/18/21  6:49 AM  ?Result Value Ref Range  ? Glucose-Capillary 92 70 - 99 mg/dL  ?  Comment: Glucose reference range applies only to samples taken after fasting for at least 8 hours.  ? ? ?Blood Alcohol level:  ?Lab Results  ?Component Value Date  ? ETH <10 06/07/2021  ? ETH <10 11/22/2020  ? ? ?Metabolic Disorder Labs: ?Lab Results  ?Component Value Date  ? HGBA1C 5.1 06/07/2021  ? MPG 99.67 06/07/2021  ? MPG 111.15 11/24/2020  ? ?No results found for: PROLACTIN ?Lab Results  ?Component Value Date  ? CHOL 197 06/07/2021  ? TRIG 149 06/07/2021  ? HDL 44 06/07/2021  ? CHOLHDL 4.5 06/07/2021  ? VLDL 30 06/07/2021  ? LDLCALC 123 (H) 06/07/2021  ? LDLCALC 148 (H) 11/24/2020  ? ? ?Physical Findings: ?AIMS:  , ,  ,  ,    ?CIWA:    ?COWS:    ? ?Musculoskeletal: ?Strength & Muscle Tone: within normal limits ?Gait & Station: normal ?Patient leans: N/A ? ?Psychiatric Specialty Exam: ? ?Presentation  ?General Appearance: Appropriate for Environment; Bizarre; Disheveled; Fairly Groomed ? ?Eye  Contact:Good ? ?Speech:Clear and Coherent; Normal Rate ? ?  Speech Volume:Normal ? ?Handedness:Right ? ? ?Mood and Affect  ?Mood:Anxious; Depressed; Hopeless; Irritable ? ?Affect:Blunt; Depressed ? ? ?Thought Process  ?Thought Processes:Coherent; Linear ? ?Descriptions of Associations:Intact ? ?Orientation:Full (Time, Place and Person) ? ?Thought Content:Logical; WDL ? ?History of Schizophrenia/Schizoaffective disorder:No ? ?Duration of Psychotic Symptoms:N/A ? ?Hallucinations:No data recorded ?Ideas of Reference:None ? ?Suicidal Thoughts:No data recorded ?Homicidal Thoughts:No data recorded ? ?Sensorium  ?Memory:Immediate Fair; Recent Fair; Remote Fair ? ?Judgment:Fair ? ?Insight:Fair ? ? ?Executive Functions  ?Concentration:Fair ? ?Attention Span:Good ? ?Recall:Good ? ?[place] ? ?Language:Good ? ? ?Psychomotor Activity  ?Psychomotor Activity:No data recorded ? ?Assets  ?Assets:Communication Skills; Physical Health; Social Support ? ? ?Sleep  ?Sleep:No data recorded ? ? ?Physical Exam: ?Physical Exam ?Vitals and nursing note reviewed.  ?Constitutional:   ?   Appearance: Normal appearance.  ?HENT:  ?   Head: Normocephalic and atraumatic.  ?   Mouth/Throat:  ?   Pharynx: Oropharynx is clear.  ?Eyes:  ?   Pupils: Pupils are equal, round, and reactive to light.  ?Cardiovascular:  ?   Rate and Rhythm: Normal rate and regular rhythm.  ?Pulmonary:  ?   Effort: Pulmonary effort is normal.  ?   Breath sounds: Normal breath sounds.  ?Abdominal:  ?   General: Abdomen is flat.  ?   Palpations: Abdomen is soft.  ?Musculoskeletal:     ?   General: Normal range of motion.  ?Skin: ?   General: Skin is warm and dry.  ?Neurological:  ?   General: No focal deficit present.  ?   Mental Status: He is alert. Mental status is at baseline.  ?Psychiatric:     ?   Attention and Perception: Attention normal.     ?   Mood and Affect: Mood normal.     ?   Speech: Speech normal.     ?   Behavior: Behavior is cooperative.     ?    Thought Content: Thought content normal.     ?   Cognition and Memory: Memory is impaired. He exhibits impaired recent memory.  ? ?Review of Systems  ?Constitutional: Negative.   ?HENT: Negative.    ?Eyes: Nega

## 2021-07-18 NOTE — Progress Notes (Signed)
Patient is pleasant and cooperative. Denies any SI, HI, AVH. Endorses anxiety.Medication compliant. Appropriate with staff and peers. Reports after ECT was groggy and sluggish. Reports feeling a little better now. Patient request prns for anxiety and insomnia. Given, awaiting effectiveness. ? ?Encouragement and support provided. Safety checks maintained. Medications given as prescribed. Pt receptive and remains safe on unit with q 15 min checks.  ?

## 2021-07-18 NOTE — Plan of Care (Signed)
?  Problem: Activity: ?Goal: Interest or engagement in leisure activities will improve ?Outcome: Progressing ?  ?Problem: Coping: ?Goal: Coping ability will improve ?Description: PT IS PROGRESSING. ?Outcome: Progressing ?  ?Problem: Health Behavior/Discharge Planning: ?Goal: Compliance with therapeutic regimen will improve ?Outcome: Progressing ?  ?Problem: Safety: ?Goal: Ability to disclose and discuss suicidal ideas will improve ?Outcome: Progressing ?  ?

## 2021-07-18 NOTE — Progress Notes (Signed)
D: Patient received his BP medication this morning before ECT treatment. Patient was wondering if he would receive treatment today; and it is verified that he is. Patient denies any SI/AVH. He remains disheveled; however, no body odor noted. Patient does seem confused this morning stating, "I really don't know what I'm doing here." He denies SI/HI; he does not appear to be responding to internal stimuli. ? ?A: Continue to monitor medication management and MD orders.  Safety checks completed every 15 minutes per protocol.  Offer support and encouragement as needed. ? ?R: Patient is receptive to staff; his behavior is appropriate.   ? ? 07/18/21 0900  ?Psych Admission Type (Psych Patients Only)  ?Admission Status Voluntary  ?Psychosocial Assessment  ?Patient Complaints Depression  ?Eye Contact Fair  ?Facial Expression Animated  ?Affect Appropriate to circumstance  ?Speech Logical/coherent  ?Interaction Assertive  ?Motor Activity Slow  ?Appearance/Hygiene Disheveled  ?Behavior Characteristics Cooperative  ?Mood Pleasant  ?Thought Process  ?Coherency WDL  ?Content WDL  ?Delusions None reported or observed  ?Perception WDL  ?Hallucination None reported or observed  ?Judgment WDL  ?Confusion None  ?Danger to Self  ?Current suicidal ideation? Denies  ?Self-Injurious Behavior No self-injurious ideation or behavior indicators observed or expressed   ?Agreement Not to Harm Self Yes  ?Description of Agreement verbal  ?Danger to Others  ?Danger to Others None reported or observed  ? ? ?

## 2021-07-18 NOTE — Plan of Care (Signed)
°  Problem: Group Participation °Goal: STG - Patient will engage in groups without prompting or encouragement from LRT x3 group sessions within 5 recreation therapy group sessions °Description: STG - Patient will engage in groups without prompting or encouragement from LRT x3 group sessions within 5 recreation therapy group sessions °Outcome: Progressing °  °

## 2021-07-18 NOTE — H&P (Signed)
Casey Silva is an 66 y.o. male.   ?Chief Complaint: sig memory problems but mood is better ?HPI: severe depression ? ?Past Medical History:  ?Diagnosis Date  ? Anxiety   ? Asthma   ? Depression   ? ? ?History reviewed. No pertinent surgical history. ? ?Family History  ?Problem Relation Age of Onset  ? Bipolar disorder Mother   ? ?Social History:  reports that he has never smoked. He has never used smokeless tobacco. He reports that he does not drink alcohol and does not use drugs. ? ?Allergies:  ?Allergies  ?Allergen Reactions  ? Dust Mite Mixed Allergen Ext [Mite (D. Farinae)] Shortness Of Breath  ? Bee Pollen Other (See Comments)  ?  Watery Eyes  ? ? ?Medications Prior to Admission  ?Medication Sig Dispense Refill  ? amLODipine (NORVASC) 10 MG tablet Take 1 tablet (10 mg total) by mouth daily. 30 tablet 0  ? gabapentin (NEURONTIN) 400 MG capsule Take 1 capsule (400 mg total) by mouth 3 (three) times daily. 90 capsule 0  ? LORazepam (ATIVAN) 0.5 MG tablet Take 1 tablet (0.5 mg total) by mouth 2 (two) times daily as needed for anxiety. 30 tablet 0  ? melatonin 5 MG TABS Take 1 tablet (5 mg total) by mouth at bedtime. 30 tablet 0  ? mirtazapine (REMERON) 30 MG tablet Take 1 tablet (30 mg total) by mouth at bedtime. 30 tablet 3  ? propranolol (INDERAL) 10 MG tablet Take 1 tablet (10 mg total) by mouth 3 (three) times daily. 90 tablet 0  ? QUEtiapine (SEROQUEL) 100 MG tablet Take 1 tablet (100 mg total) by mouth at bedtime. 30 tablet 0  ? QUEtiapine (SEROQUEL) 25 MG tablet Take 1 tablet (25 mg total) by mouth 2 (two) times daily. 60 tablet 0  ? traZODone (DESYREL) 100 MG tablet Take 1 tablet (100 mg total) by mouth at bedtime. 30 tablet 0  ? venlafaxine XR (EFFEXOR-XR) 37.5 MG 24 hr capsule Take 3 capsules (112.5 mg total) by mouth daily with breakfast. 30 capsule 0  ? Vitamin D, Ergocalciferol, (DRISDOL) 1.25 MG (50000 UNIT) CAPS capsule Take 1 capsule (50,000 Units total) by mouth every 7 (seven) days. 5 capsule 0   ? ? ?Results for orders placed or performed during the hospital encounter of 06/23/21 (from the past 48 hour(s))  ?Glucose, capillary     Status: None  ? Collection Time: 07/18/21  6:49 AM  ?Result Value Ref Range  ? Glucose-Capillary 92 70 - 99 mg/dL  ?  Comment: Glucose reference range applies only to samples taken after fasting for at least 8 hours.  ? ?No results found. ? ?Review of Systems  ?Constitutional: Negative.   ?HENT: Negative.    ?Eyes: Negative.   ?Respiratory: Negative.    ?Cardiovascular: Negative.   ?Gastrointestinal: Negative.   ?Musculoskeletal: Negative.   ?Skin: Negative.   ?Neurological: Negative.   ?Psychiatric/Behavioral:  Negative for dysphoric mood, hallucinations, self-injury, sleep disturbance and suicidal ideas. The patient is not nervous/anxious and is not hyperactive.   ? ?Blood pressure 140/86, pulse 83, temperature (!) 97.3 ?F (36.3 ?C), temperature source Tympanic, resp. rate 18, height '5\' 6"'$  (1.676 m), weight 76.2 kg, SpO2 98 %. ?Physical Exam ?Vitals and nursing note reviewed.  ?Constitutional:   ?   Appearance: He is well-developed.  ?HENT:  ?   Head: Normocephalic and atraumatic.  ?Eyes:  ?   Conjunctiva/sclera: Conjunctivae normal.  ?   Pupils: Pupils are equal, round, and reactive to light.  ?  Cardiovascular:  ?   Heart sounds: Normal heart sounds.  ?Pulmonary:  ?   Effort: Pulmonary effort is normal.  ?Abdominal:  ?   Palpations: Abdomen is soft.  ?Musculoskeletal:     ?   General: Normal range of motion.  ?   Cervical back: Normal range of motion.  ?Skin: ?   General: Skin is warm and dry.  ?Neurological:  ?   General: No focal deficit present.  ?   Mental Status: He is alert.  ?Psychiatric:     ?   Attention and Perception: Attention normal.     ?   Mood and Affect: Mood normal.     ?   Speech: Speech normal.     ?   Behavior: Behavior is cooperative.     ?   Thought Content: Thought content normal.     ?   Cognition and Memory: Memory is impaired. He exhibits impaired  recent memory.  ?  ? ?Assessment/Plan ?Ect today and then stop index course ? ?Alethia Berthold, MD ?07/18/2021, 12:15 PM ? ? ? ?

## 2021-07-18 NOTE — Transfer of Care (Signed)
Immediate Anesthesia Transfer of Care Note ? ?Patient: Casey Silva ? ?Procedure(s) Performed: ECT TX ? ?Patient Location: PACU ? ?Anesthesia Type:General ? ?Level of Consciousness: drowsy ? ?Airway & Oxygen Therapy: Patient Spontanous Breathing ? ?Post-op Assessment: Report given to RN and Post -op Vital signs reviewed and stable ? ?Post vital signs: Reviewed and stable ? ?Last Vitals:  ?Vitals Value Taken Time  ?BP 150/94 07/18/21 1315  ?Temp 37.2 ?C 07/18/21 1315  ?Pulse 91 07/18/21 1315  ?Resp 16 07/18/21 1315  ?SpO2 92 % 07/18/21 1315  ? ? ?Last Pain:  ?Vitals:  ? 07/18/21 1315  ?TempSrc:   ?PainSc: Asleep  ?   ? ?Patients Stated Pain Goal: 0 (07/07/21 0900) ? ?Complications: No notable events documented. ?

## 2021-07-18 NOTE — Progress Notes (Signed)
Recreation Therapy Notes ? ?Date: 07/18/2021 ? ?Time: 10:40 am    ? ?Location: Craft room    ? ?Behavioral response: Appropriate ? ?Intervention Topic: Stress Management   ? ?Discussion/Intervention:  ?Group content on today was focused on stress. The group defined stress and way to cope with stress. Participants expressed how they know when they are stresses out. Individuals described the different ways they have to cope with stress. The group stated reasons why it is important to cope with stress. Patient explained what good stress is and some examples. The group participated in the intervention ?Stress Management?. Individuals were separated into two group and answered questions related to stress.  ?Clinical Observations/Feedback: ?[name] late and was focused on what peers and staff had to say about stress management. Individual was social with peers and staff while participating in the intervention.    ?Gardenia Witter LRT/CTRS  ? ? ? ? ? ? ? ?Lise Pincus ?07/18/2021 12:25 PM ?

## 2021-07-19 NOTE — Plan of Care (Signed)
?  Problem: Education: ?Goal: Utilization of techniques to improve thought processes will improve ?Outcome: Progressing ?Goal: Knowledge of the prescribed therapeutic regimen will improve ?Outcome: Progressing ?  ?Problem: Activity: ?Goal: Interest or engagement in leisure activities will improve ?Outcome: Progressing ?Goal: Imbalance in normal sleep/wake cycle will improve ?Outcome: Progressing ?  ?Problem: Coping: ?Goal: Coping ability will improve ?Description: PT IS PROGRESSING. ?Outcome: Progressing ?Goal: Will verbalize feelings ?Outcome: Progressing ?  ?Problem: Self-Concept: ?Goal: Will verbalize positive feelings about self ?Outcome: Progressing ?Goal: Level of anxiety will decrease ?Outcome: Progressing ?  ?Problem: Safety: ?Goal: Ability to disclose and discuss suicidal ideas will improve ?Outcome: Progressing ?Goal: Ability to identify and utilize support systems that promote safety will improve ?Outcome: Progressing ?  ?Problem: Role Relationship: ?Goal: Will demonstrate positive changes in social behaviors and relationships ?Outcome: Progressing ?  ?

## 2021-07-19 NOTE — BH IP Treatment Plan (Signed)
Interdisciplinary Treatment and Diagnostic Plan Update  07/19/2021 Time of Session: 0830 [place] Mck[place]on MRN: [id number]  Principal Diagnosis: Severe recurrent major depression with psychotic features [facility])  Secondary Diagnoses: Principal Problem:   Severe recurrent major depression with psychotic features ([place]) Active Problems:   Primary insomnia   GAD (generalized anxiety disorder)   Essential hypertension   Current Medications:  Current Facility-Administered Medications  Medication Dose Route Frequency Provider Last Rate Last Admin   0.9 %  sodium chloride infusion  500 mL Intravenous Once Clapacs, [name], MD       acetaminophen (TYLENOL) tablet 650 mg  650 mg Oral Once PRN Darrin Nipper, MD       Or   acetaminophen (TYLENOL) 160 MG/5ML solution 325-650 mg  325-650 mg Oral Q4H PRN Darrin Nipper, MD       amLODipine (NORVASC) tablet 10 mg  10 mg Oral Daily Clapacs, [name], MD   10 mg at 07/19/21 0093   diclofenac Sodium (VOLTAREN) 1 % topical gel 2 g  2 g Topical QID Clapacs, [name], MD   2 g at 07/18/21 1659   feeding supplement (ENSURE ENLIVE / ENSURE PLUS) liquid 237 mL  237 mL Oral BID BM Clapacs, [name], MD   237 mL at 07/17/21 1620   fentaNYL (SUBLIMAZE) injection 25 mcg  25 mcg Intravenous Q5 min PRN Molli Barrows, MD       hydrOXYzine (ATARAX) tablet 50 mg  50 mg Oral Q6H PRN Clapacs, [name], MD   50 mg at 07/18/21 2103   ibuprofen (ADVIL) tablet 600 mg  600 mg Oral Q6H PRN Clapacs, [name], MD   600 mg at 06/27/21 0915   loperamide (IMODIUM) capsule 2 mg  2 mg Oral PRN Larita Fife, MD   2 mg at 07/03/21 1323   melatonin tablet 5 mg  5 mg Oral QHS Clapacs, [name], MD   5 mg at 07/18/21 2103   midazolam (VERSED) injection 2 mg  2 mg Intravenous Once Clapacs, [name], MD       mirtazapine (REMERON) tablet 45 mg  45 mg Oral QHS Clapacs, [name], MD   45 mg at 07/18/21 2103   multivitamin with minerals tablet 1 tablet  1 tablet Oral Daily Clapacs, [name], MD   1 tablet at  07/19/21 0839   ondansetron (ZOFRAN) injection 4 mg  4 mg Intravenous Once PRN Arita Miss, MD       ondansetron [facility]) injection 4 mg  4 mg Intravenous Once Clapacs, [name], MD       ondansetron (ZOFRAN-ODT) disintegrating tablet 4 mg  4 mg Oral Q4H PRN Clapacs, [name], MD       QUEtiapine (SEROQUEL) tablet 100 mg  100 mg Oral QHS Clapacs, [name], MD   100 mg at 07/18/21 2103   traZODone (DESYREL) tablet 100 mg  100 mg Oral QHS PRN Clapacs, [name], MD   100 mg at 07/18/21 2103   venlafaxine XR (EFFEXOR-XR) 24 hr capsule 150 mg  150 mg Oral Q breakfast Clapacs, [name], MD   150 mg at 07/19/21 8182   Vitamin D (Ergocalciferol) (DRISDOL) capsule 50,000 Units  50,000 Units Oral Q7 days Clapacs, [name], MD   50,000 Units at 07/16/21 1648   PTA Medications: Medications Prior to Admission  Medication Sig Dispense Refill Last Dose   amLODipine (NORVASC) 10 MG tablet Take 1 tablet (10 mg total) by mouth daily. 30 tablet 0 07/03/2021  gabapentin (NEURONTIN) 400 MG capsule Take 1 capsule (400 mg total) by mouth 3 (three) times daily. 90 capsule 0 07/03/2021   LORazepam (ATIVAN) 0.5 MG tablet Take 1 tablet (0.5 mg total) by mouth 2 (two) times daily as needed for anxiety. 30 tablet 0 07/03/2021   melatonin 5 MG TABS Take 1 tablet (5 mg total) by mouth at bedtime. 30 tablet 0 07/03/2021   mirtazapine (REMERON) 30 MG tablet Take 1 tablet (30 mg total) by mouth at bedtime. 30 tablet 3 07/03/2021   propranolol (INDERAL) 10 MG tablet Take 1 tablet (10 mg total) by mouth 3 (three) times daily. 90 tablet 0 07/03/2021   QUEtiapine (SEROQUEL) 100 MG tablet Take 1 tablet (100 mg total) by mouth at bedtime. 30 tablet 0 07/03/2021   QUEtiapine (SEROQUEL) 25 MG tablet Take 1 tablet (25 mg total) by mouth 2 (two) times daily. 60 tablet 0 07/03/2021   traZODone (DESYREL) 100 MG tablet Take 1 tablet (100 mg total) by mouth at bedtime. 30 tablet 0 07/03/2021   venlafaxine XR (EFFEXOR-XR) 37.5 MG 24 hr capsule Take 3 capsules  (112.5 mg total) by mouth daily with breakfast. 30 capsule 0 07/03/2021   Vitamin D, Ergocalciferol, (DRISDOL) 1.25 MG (50000 UNIT) CAPS capsule Take 1 capsule (50,000 Units total) by mouth every 7 (seven) days. 5 capsule 0 07/03/2021    Patient Stressors: Loss of connection with work, loss of sense of purpose   Other: loss of mental health    Patient Strengths: Ability for insight  Communication skills  General fund of knowledge  Motivation for treatment/growth   Treatment Modalities: Medication Management, Group therapy, Case management,  1 to 1 session with clinician, Psychoeducation, Recreational therapy.   Physician Treatment Plan for Primary Diagnosis: Severe recurrent major depression with psychotic features ([place]) Long Term Goal(s): Improvement in symptoms so as ready for discharge   Short Term Goals: Ability to maintain clinical measurements within normal limits will improve Compliance with prescribed medications will improve Ability to verbalize feelings will improve Ability to disclose and discuss suicidal ideas Ability to demonstrate self-control will improve  Medication Management: Evaluate patient's response, side effects, and tolerance of medication regimen.  Therapeutic Interventions: 1 to 1 sessions, Unit Group sessions and Medication administration.  Evaluation of Outcomes: Progressing  Physician Treatment Plan for Secondary Diagnosis: Principal Problem:   Severe recurrent major depression with psychotic features ([place]) Active Problems:   Primary insomnia   GAD (generalized anxiety disorder)   Essential hypertension  Long Term Goal(s): Improvement in symptoms so as ready for discharge   Short Term Goals: Ability to maintain clinical measurements within normal limits will improve Compliance with prescribed medications will improve Ability to verbalize feelings will improve Ability to disclose and discuss suicidal ideas Ability to demonstrate self-control will  improve     Medication Management: Evaluate patient's response, side effects, and tolerance of medication regimen.  Therapeutic Interventions: 1 to 1 sessions, Unit Group sessions and Medication administration.  Evaluation of Outcomes: Progressing   RN Treatment Plan for Primary Diagnosis: Severe recurrent major depression with psychotic features ([place]) Long Term Goal(s): Knowledge of disease and therapeutic regimen to maintain health will improve  Short Term Goals: Ability to remain free from injury will improve, Ability to verbalize frustration and anger appropriately will improve, Ability to demonstrate self-control, Ability to participate in decision making will improve, Ability to verbalize feelings will improve, Ability to disclose and discuss suicidal ideas, Ability to identify and develop effective coping behaviors will improve, and  Compliance with prescribed medications will improve  Medication Management: RN will administer medications as ordered by provider, will assess and evaluate patient's response and provide education to patient for prescribed medication. RN will report any adverse and/or side effects to prescribing provider.  Therapeutic Interventions: 1 on 1 counseling sessions, Psychoeducation, Medication administration, Evaluate responses to treatment, Monitor vital signs and CBGs as ordered, Perform/monitor CIWA, COWS, AIMS and Fall Risk screenings as ordered, Perform wound care treatments as ordered.  Evaluation of Outcomes: Progressing   LCSW Treatment Plan for Primary Diagnosis: Severe recurrent major depression with psychotic features ([place]) Long Term Goal(s): Safe transition to appropriate next level of care at discharge, Engage patient in therapeutic group addressing interpersonal concerns.  Short Term Goals: Engage patient in aftercare planning with referrals and resources, Increase social support, Increase ability to appropriately verbalize feelings, Increase  emotional regulation, Facilitate acceptance of mental health diagnosis and concerns, Facilitate patient progression through stages of change regarding substance use diagnoses and concerns, Identify triggers associated with mental health/substance abuse issues, and Increase skills for wellness and recovery  Therapeutic Interventions: Assess for all discharge needs, 1 to 1 time with Social worker, Explore available resources and support systems, Assess for adequacy in community support network, Educate family and significant other(s) on suicide prevention, Complete Psychosocial Assessment, Interpersonal group therapy.  Evaluation of Outcomes: Progressing   Progress in Treatment: Attending groups: No. Participating in groups: No. Taking medication as prescribed: Yes. Toleration medication: Yes. Family/Significant other contact made: No, will contact:  Patient denied consent for CSW to reach out to family/friend. Patient understands diagnosis: Yes. Discussing patient identified problems/goals with staff: Yes. Medical problems stabilized or resolved: Yes. Denies suicidal/homicidal ideation: No. Issues/concerns per patient self-inventory: Yes. Other: none  New problem(s) identified: No, Describe:  NO additional problems/concerns identified at this time.   New Short Term/Long Term Goal(s): Patient to work towards medication management for mood stabilization; elimination of SI thoughts; development of comprehensive mental wellness plan.  Patient Goals: No additional goals identified at this time. Patient to continue to work towards original goals identified in initial treatment team meeting. CSW will remain available to patient should they voice additional treatment goals.   Discharge Plan or Barriers: No psychosocial barriers identified at this time, patient to return to place of residence when appropriate for discharge.   Reason for Continuation of Hospitalization: Depression  Estimated  Length of Stay:  Last [place] Suicide Severity Risk Score: Flowsheet Row Admission (Current) from 06/23/2021 in [facility] Most recent reading at 07/18/2021 11:44 AM Admission (Discharged) from 06/07/2021 in [facility] Most recent reading at 06/07/2021  3:15 PM ED from 06/07/2021 in [facility] DEPT Most recent reading at 06/07/2021  4:32 AM  C-SSRS RISK CATEGORY Error: Q3, 4, or 5 should not be populated when Q2 is No No Risk High Risk       Last PHQ 2/9 Scores:    06/07/2021    4:31 AM 03/30/2021    2:06 PM 12/28/2020   11:45 AM  Depression screen PHQ 2/9  Decreased Interest '3 3 3  '$ Down, Depressed, Hopeless '3 2 3  '$ PHQ - 2 Score '6 5 6  '$ Altered sleeping '2 3 3  '$ Tired, decreased energy '2 3 3  '$ Change in appetite '2 1 3  '$ Feeling bad or failure about yourself  '2 3 3  '$ Trouble concentrating '1 2 3  '$ Moving slowly or fidgety/restless 1 1 0  Suicidal thoughts 3 2  3  PHQ-9 Score '19 20 24  '$ Difficult doing work/chores Extremely dIfficult Very difficult Extremely dIfficult    Scribe for Treatment Team: Larose Kells 07/19/2021 10:03 AM

## 2021-07-19 NOTE — Plan of Care (Signed)
D- Patient alert and oriented. Patient presents in a pleasant mood on assessment reporting that he slept good last night and had no complaints to voice to this Probation officer. Patient also reported suicidal thoughts on his self-inventory. He stated to this writer that the thoughts are "passing through. I feel safe here, but when I leave, I don't know". Patient continues to endorse depression, anxiety and hopelessness stating that "things in general" is why he's feeling this way. Patient denies HI/AVH and pain at this time. Patient's goal for today is "getting through the day", in which he will "apply myself", in order to achieve his goal. ? ?A- Scheduled medications administered to patient, per MD orders. Support and encouragement provided.  Routine safety checks conducted every 15 minutes.  Patient informed to notify staff with problems or concerns. ? ?R- No adverse drug reactions noted. Patient contracts for safety at this time. Patient compliant with medications and treatment plan. Patient receptive, calm, and cooperative. Patient interacts well with others on the unit.  Patient remains safe at this time. ? ?Problem: Education: ?Goal: Utilization of techniques to improve thought processes will improve ?Outcome: Progressing ?Goal: Knowledge of the prescribed therapeutic regimen will improve ?Outcome: Progressing ?  ?Problem: Activity: ?Goal: Interest or engagement in leisure activities will improve ?Outcome: Progressing ?Goal: Imbalance in normal sleep/wake cycle will improve ?Outcome: Progressing ?  ?Problem: Coping: ?Goal: Coping ability will improve ?Description: PT IS PROGRESSING. ?Outcome: Progressing ?Goal: Will verbalize feelings ?Outcome: Progressing ?  ?Problem: Health Behavior/Discharge Planning: ?Goal: Ability to make decisions will improve ?Outcome: Progressing ?Goal: Compliance with therapeutic regimen will improve ?Outcome: Progressing ?  ?Problem: Role Relationship: ?Goal: Will demonstrate positive changes  in social behaviors and relationships ?Outcome: Progressing ?  ?Problem: Safety: ?Goal: Ability to disclose and discuss suicidal ideas will improve ?Outcome: Progressing ?Goal: Ability to identify and utilize support systems that promote safety will improve ?Outcome: Progressing ?  ?Problem: Self-Concept: ?Goal: Will verbalize positive feelings about self ?Outcome: Progressing ?Goal: Level of anxiety will decrease ?Outcome: Progressing ?  ?

## 2021-07-19 NOTE — Progress Notes (Signed)
Patient has been observed multiple times throughout the day down in the dayroom, watching television with other members on the unit, without any issues.  ?

## 2021-07-19 NOTE — Anesthesia Postprocedure Evaluation (Signed)
Anesthesia Post Note ? ?Patient: Casey Silva ? ?Procedure(s) Performed: ECT TX ? ?Patient location during evaluation: PACU ?Anesthesia Type: General ?Level of consciousness: awake and alert, oriented and patient cooperative ?Pain management: pain level controlled ?Vital Signs Assessment: post-procedure vital signs reviewed and stable ?Respiratory status: spontaneous breathing, nonlabored ventilation and respiratory function stable ?Cardiovascular status: blood pressure returned to baseline and stable ?Postop Assessment: adequate PO intake ?Anesthetic complications: no ? ? ?No notable events documented. ? ? ?Last Vitals:  ?Vitals:  ? 07/19/21 0626 07/19/21 0840  ?BP: (!) 139/95 103/85  ?Pulse: 84 88  ?Resp: 17   ?Temp: 36.8 ?C   ?SpO2: 100%   ?  ?Last Pain:  ?Vitals:  ? 07/19/21 0900  ?TempSrc:   ?PainSc: 0-No pain  ? ? ?  ?  ?  ?  ?  ?  ? ?Darrin Nipper ? ? ? ? ?

## 2021-07-19 NOTE — Group Note (Signed)
[place] LCSW Group Therapy Note ? ? ?Group Date: 07/19/2021 ?Start Time: 1315 ?End Time: 0076 ? ?Type of Therapy/Topic:  Group Therapy:  Feelings about Diagnosis ? ?Participation Level:  None  ? ?Description of Group:   ? This group will allow patients to explore their thoughts and feelings about diagnoses they have received. Patients will be guided to explore their level of understanding and acceptance of these diagnoses. Facilitator will encourage patients to process their thoughts and feelings about the reactions of others to their diagnosis, and will guide patients in identifying ways to discuss their diagnosis with significant others in their lives. This group will be process-oriented, with patients participating in exploration of their own experiences as well as giving and receiving support and challenge from other group members. ? ? ?Therapeutic Goals: ?1. Patient will demonstrate understanding of diagnosis as evidence by identifying two or more symptoms of the disorder:  ?2. Patient will be able to express two feelings regarding the diagnosis ?3. Patient will demonstrate ability to communicate their needs through discussion and/or role plays ? ?Summary of Patient Progress: ?Patient was present in group, however, did not engage in group discussion.   ? ?Therapeutic Modalities:   ?Cognitive Behavioral Therapy ?Brief Therapy ?Feelings Identification  ? ? ?Rozann Lesches, LCSW ?

## 2021-07-19 NOTE — Progress Notes (Signed)
Recreation Therapy Notes ? ?Date: 07/19/2021 ? ?Time: 10:35 am   ? ?Location: Courtyard   ? ?Behavioral response: N/A ?  ?Intervention Topic: Social skills   ? ?Discussion/Intervention: ?Patient refused to attend group.  ? ?Clinical Observations/Feedback:  ?Patient refused to attend group.  ?  ?Cu[place]utemoc Attar LRT/CTRS ? ? ? ? ? ? ? ? ?Atif Chapple ?07/19/2021 1:07 PM ?

## 2021-07-19 NOTE — Progress Notes (Signed)
[facility] MD Progress Note ? ?07/19/2021 2:17 PM ?Casey Silva  ?MRN:  [id number] ?Subjective: Follow-up for this 66 year old man with depression.  Patient was in bed this afternoon telling me he felt very frightened because he thinks he got "close to death" during ECT.  Apparently he woke up from anesthesia feeling frightened.  I explained to him that there were no complications and he at no time was close to death but that anesthesia can leave people disoriented.  He admits that overall his mood is better denies any suicidal thoughts. ?Principal Problem: Severe recurrent major depression with psychotic features ([place]) ?Diagnosis: Principal Problem: ?  Severe recurrent major depression with psychotic features ([place]) ?Active Problems: ?  Primary insomnia ?  GAD (generalized anxiety disorder) ?  Essential hypertension ? ?Total Time spent with patient: 30 minutes ? ?Past Psychiatric History: Past history of recurrent depression ? ?Past Medical History:  ?Past Medical History:  ?Diagnosis Date  ? Anxiety   ? Asthma   ? Depression   ? History reviewed. No pertinent surgical history. ?Family History:  ?Family History  ?Problem Relation Age of Onset  ? Bipolar disorder Mother   ? ?Family Psychiatric  History: See previous ?Social History:  ?Social History  ? ?Substance and Sexual Activity  ?Alcohol Use No  ?   ?Social History  ? ?Substance and Sexual Activity  ?Drug Use No  ?  ?Social History  ? ?Socioeconomic History  ? Marital status: Single  ?  Spouse name: Not on file  ? Number of children: 1  ? Years of education: Not on file  ? Highest education level: GED or equivalent  ?Occupational History  ? Not on file  ?Tobacco Use  ? Smoking status: Never  ? Smokeless tobacco: Never  ?Vaping Use  ? Vaping Use: Unknown  ?Substance and Sexual Activity  ? Alcohol use: No  ? Drug use: No  ? Sexual activity: Not Currently  ?Other Topics Concern  ? Not on file  ?Social History Narrative  ? Marital status:  Single   Children: Daughter    Employment:  Unemployment. previous work for [employer].   Alcohol:  None   Drugs:  none   Exercise:  Regularly very   Religion:  Jewish  ? ?Social Determinants of Health  ? ?Financial Resource Strain: Not on file  ?Food Insecurity: Not on file  ?Transportation Needs: Not on file  ?Physical Activity: Not on file  ?Stress: Not on file  ?Social Connections: Not on file  ? ?Additional Social History:  ?  ?  ?  ?  ?  ?  ?  ?  ?  ?  ?  ? ?Sleep: Fair ? ?Appetite:  Fair ? ?Current Medications: ?Current Facility-Administered Medications  ?Medication Dose Route Frequency Provider Last Rate Last Admin  ? 0.9 %  sodium chloride infusion  500 mL Intravenous Once Caasi Giglia, [name], MD      ? acetaminophen (TYLENOL) tablet 650 mg  650 mg Oral Once PRN Darrin Nipper, MD      ? Or  ? acetaminophen (TYLENOL) 160 MG/5ML solution 325-650 mg  325-650 mg Oral Q4H PRN Darrin Nipper, MD      ? amLODipine (NORVASC) tablet 10 mg  10 mg Oral Daily Cyani Kallstrom, [name], MD   10 mg at 07/19/21 0865  ? diclofenac Sodium (VOLTAREN) 1 % topical gel 2 g  2 g Topical QID Danaisha Celli, [name], MD   2 g at 07/18/21 1659  ? feeding supplement (  ENSURE ENLIVE / ENSURE PLUS) liquid 237 mL  237 mL Oral BID BM Dionne Knoop T, MD   237 mL at 07/19/21 1000  ? fentaNYL (SUBLIMAZE) injection 25 mcg  25 mcg Intravenous Q5 min PRN Molli Barrows, MD      ? hydrOXYzine (ATARAX) tablet 50 mg  50 mg Oral Q6H PRN Teshawn Moan, [name], MD   50 mg at 07/18/21 2103  ? ibuprofen (ADVIL) tablet 600 mg  600 mg Oral Q6H PRN Darrelle Wiberg, [name], MD   600 mg at 06/27/21 0915  ? loperamide (IMODIUM) capsule 2 mg  2 mg Oral PRN Larita Fife, MD   2 mg at 07/03/21 1323  ? melatonin tablet 5 mg  5 mg Oral QHS Fahima Cifelli, [name], MD   5 mg at 07/18/21 2103  ? midazolam (VERSED) injection 2 mg  2 mg Intravenous Once Maizie Garno T, MD      ? mirtazapine (REMERON) tablet 45 mg  45 mg Oral QHS Shawntae Lowy, [name], MD   45 mg at 07/18/21 2103  ? multivitamin with minerals tablet 1 tablet  1 tablet  Oral Daily Michail Boyte, [name], MD   1 tablet at 07/19/21 0839  ? ondansetron (ZOFRAN) injection 4 mg  4 mg Intravenous Once PRN Arita Miss, MD      ? ondansetron [facility]) injection 4 mg  4 mg Intravenous Once Ersie Savino, [name], MD      ? ondansetron (ZOFRAN-ODT) disintegrating tablet 4 mg  4 mg Oral Q4H PRN Yitty [place]s, [name], MD      ? QUEtiapine (SEROQUEL) tablet 100 mg  100 mg Oral QHS Kehinde Totzke, [name], MD   100 mg at 07/18/21 2103  ? traZODone (DESYREL) tablet 100 mg  100 mg Oral QHS PRN Andora Krull, [name], MD   100 mg at 07/18/21 2103  ? venlafaxine XR (EFFEXOR-XR) 24 hr capsule 150 mg  150 mg Oral Q breakfast Brook Geraci, [name], MD   150 mg at 07/19/21 0839  ? Vitamin D (Ergocalciferol) (DRISDOL) capsule 50,000 Units  50,000 Units Oral Q7 days Khristi Schiller, [name], MD   50,000 Units at 07/16/21 1648  ? ? ?Lab Results:  ?Results for orders placed or performed during the hospital encounter of 06/23/21 (from the past 48 hour(s))  ?Glucose, capillary     Status: None  ? Collection Time: 07/18/21  6:49 AM  ?Result Value Ref Range  ? Glucose-Capillary 92 70 - 99 mg/dL  ?  Comment: Glucose reference range applies only to samples taken after fasting for at least 8 hours.  ? ? ?Blood Alcohol level:  ?Lab Results  ?Component Value Date  ? ETH <10 06/07/2021  ? ETH <10 11/22/2020  ? ? ?Metabolic Disorder Labs: ?Lab Results  ?Component Value Date  ? HGBA1C 5.1 06/07/2021  ? MPG 99.67 06/07/2021  ? MPG 111.15 11/24/2020  ? ?No results found for: PROLACTIN ?Lab Results  ?Component Value Date  ? CHOL 197 06/07/2021  ? TRIG 149 06/07/2021  ? HDL 44 06/07/2021  ? CHOLHDL 4.5 06/07/2021  ? VLDL 30 06/07/2021  ? LDLCALC 123 (H) 06/07/2021  ? LDLCALC 148 (H) 11/24/2020  ? ? ?Physical Findings: ?AIMS:  , ,  ,  ,    ?CIWA:    ?COWS:    ? ?Musculoskeletal: ?Strength & Muscle Tone: within normal limits ?Gait & Station: normal ?Patient leans: N/A ? ?Psychiatric Specialty Exam: ? ?Presentation  ?General Appearance: Appropriate for Environment; Bizarre;  Disheveled; Fairly Groomed ? ?Eye  Contact:Good ? ?Speech:Clear and Coherent; Normal Rate ? ?Speech Volume:Normal ? ?Handedness:Right ? ? ?Mood and Affect  ?Mood:Anxious; Depressed; Hopeless; Irritable ? ?Affect:Blunt; Depressed ? ? ?Thought Process  ?Thought Processes:Coherent; Linear ? ?Descriptions of Associations:Intact ? ?Orientation:Full (Time, Place and Person) ? ?Thought Content:Logical; WDL ? ?History of Schizophrenia/Schizoaffective disorder:No ? ?Duration of Psychotic Symptoms:N/A ? ?Hallucinations:No data recorded ?Ideas of Reference:None ? ?Suicidal Thoughts:No data recorded ?Homicidal Thoughts:No data recorded ? ?Sensorium  ?Memory:Immediate Fair; Recent Fair; Remote Fair ? ?Judgment:Fair ? ?Insight:Fair ? ? ?Executive Functions  ?Concentration:Fair ? ?Attention Span:Good ? ?Recall:Good ? ?[place] ? ?Language:Good ? ? ?Psychomotor Activity  ?Psychomotor Activity:No data recorded ? ?Assets  ?Assets:Communication Skills; Physical Health; Social Support ? ? ?Sleep  ?Sleep:No data recorded ? ? ?Physical Exam: ?Physical Exam ?Vitals and nursing note reviewed.  ?Constitutional:   ?   Appearance: Normal appearance.  ?HENT:  ?   Head: Normocephalic and atraumatic.  ?   Mouth/Throat:  ?   Pharynx: Oropharynx is clear.  ?Eyes:  ?   Pupils: Pupils are equal, round, and reactive to light.  ?Cardiovascular:  ?   Rate and Rhythm: Normal rate and regular rhythm.  ?Pulmonary:  ?   Effort: Pulmonary effort is normal.  ?   Breath sounds: Normal breath sounds.  ?Abdominal:  ?   General: Abdomen is flat.  ?   Palpations: Abdomen is soft.  ?Musculoskeletal:     ?   General: Normal range of motion.  ?Skin: ?   General: Skin is warm and dry.  ?Neurological:  ?   General: No focal deficit present.  ?   Mental Status: He is alert. Mental status is at baseline.  ?Psychiatric:     ?   Attention and Perception: Attention normal.     ?   Mood and Affect: Mood is anxious. Affect is blunt.     ?   Speech: Speech  normal.     ?   Behavior: Behavior is cooperative.     ?   Thought Content: Thought content normal.     ?   Cognition and Memory: Memory is impaired.  ? ?Review of Systems  ?Constitutional: Negative.   ?HENT: Neg

## 2021-07-19 NOTE — Progress Notes (Signed)
Patient continues to refuse his Voltaren gel, stating that he doesn't need it at this time. ?

## 2021-07-20 MED ORDER — DICLOFENAC SODIUM 1 % EX GEL: 2.0000 g | Freq: Two times a day (BID) | CUTANEOUS | Status: DC | PRN

## 2021-07-20 NOTE — Progress Notes (Signed)
Reports having a good day and feeling much better.  Denies si  hi avh less anxious and depressed (3/10) at this encounter.  He has been in the dayroom hanging out with other patients on the unit.  Appears to be having a good time, engages well with the others.   He is med compliant and received his meds without incident.  Will continue to monitor with q 15 minute safety checks. Encouraged patient to seek staff with any concerns. ? ? ? ?C Butler-Nicholson, LPN ?

## 2021-07-20 NOTE — Progress Notes (Signed)
Recreation Therapy Notes ? ?Date: 07/20/2021 ? ?Time: 10:40am   ? ?Location: Craft room  ? ?Behavioral response: N/A ?  ?Intervention Topic: Goals    ? ?Discussion/Intervention: ?Patient refused to attend group.  ? ?Clinical Observations/Feedback:  ?Patient refused to attend group.  ?  ?Rhian Funari LRT/CTRS ? ? ? ? ? ? ? ?Gladies Sofranko ?07/20/2021 12:11 PM ?

## 2021-07-20 NOTE — Progress Notes (Signed)
[facility] MD Progress Note ? ?07/20/2021 3:58 PM ?Casey Silva  ?MRN:  [id number] ?Subjective: Follow-up with this patient with severe depression.  He has gone through whole course of ECT and shown improvement and is now recovering.  Continues to complain of memory problems.  I reminded him that these are likely to be chronic for this particular time.  But should recover in time.  We reviewed the fact that he has been in the hospital for what probably adds up to a couple months at this point.  Reminded him that he needs to check on his apartment and his living situation because we are hoping for discharge within a week. ?Principal Problem: Severe recurrent major depression with psychotic features ([place]) ?Diagnosis: Principal Problem: ?  Severe recurrent major depression with psychotic features ([place]) ?Active Problems: ?  Primary insomnia ?  GAD (generalized anxiety disorder) ?  Essential hypertension ? ?Total Time spent with patient: 30 minutes ? ?Past Psychiatric History: Past history of recurrent severe depression ? ?Past Medical History:  ?Past Medical History:  ?Diagnosis Date  ? Anxiety   ? Asthma   ? Depression   ? History reviewed. No pertinent surgical history. ?Family History:  ?Family History  ?Problem Relation Age of Onset  ? Bipolar disorder Mother   ? ?Family Psychiatric  History: See previous ?Social History:  ?Social History  ? ?Substance and Sexual Activity  ?Alcohol Use No  ?   ?Social History  ? ?Substance and Sexual Activity  ?Drug Use No  ?  ?Social History  ? ?Socioeconomic History  ? Marital status: Single  ?  Spouse name: Not on file  ? Number of children: 1  ? Years of education: Not on file  ? Highest education level: GED or equivalent  ?Occupational History  ? Not on file  ?Tobacco Use  ? Smoking status: Never  ? Smokeless tobacco: Never  ?Vaping Use  ? Vaping Use: Unknown  ?Substance and Sexual Activity  ? Alcohol use: No  ? Drug use: No  ? Sexual activity: Not Currently  ?Other Topics Concern  ? Not  on file  ?Social History Narrative  ? Marital status:  Single   Children: Daughter   Employment:  Unemployment. previous work for [employer].   Alcohol:  None   Drugs:  none   Exercise:  Regularly very   Religion:  Jewish  ? ?Social Determinants of Health  ? ?Financial Resource Strain: Not on file  ?Food Insecurity: Not on file  ?Transportation Needs: Not on file  ?Physical Activity: Not on file  ?Stress: Not on file  ?Social Connections: Not on file  ? ?Additional Social History:  ?  ?  ?  ?  ?  ?  ?  ?  ?  ?  ?  ? ?Sleep: Fair ? ?Appetite:  Fair ? ?Current Medications: ?Current Facility-Administered Medications  ?Medication Dose Route Frequency Provider Last Rate Last Admin  ? 0.9 %  sodium chloride infusion  500 mL Intravenous Once Honest Vanleer, [name], MD      ? acetaminophen (TYLENOL) tablet 650 mg  650 mg Oral Once PRN Darrin Nipper, MD      ? Or  ? acetaminophen (TYLENOL) 160 MG/5ML solution 325-650 mg  325-650 mg Oral Q4H PRN Darrin Nipper, MD      ? amLODipine (NORVASC) tablet 10 mg  10 mg Oral Daily Serenna Deroy, [name], MD   10 mg at 07/20/21 9937  ? diclofenac Sodium (VOLTAREN) 1 % topical gel  2 g  2 g Topical QID Marly Schuld, [name], MD   2 g at 07/18/21 1659  ? feeding supplement (ENSURE ENLIVE / ENSURE PLUS) liquid 237 mL  237 mL Oral BID BM Milferd Ansell T, MD   237 mL at 07/20/21 1520  ? fentaNYL (SUBLIMAZE) injection 25 mcg  25 mcg Intravenous Q5 min PRN Molli Barrows, MD      ? hydrOXYzine (ATARAX) tablet 50 mg  50 mg Oral Q6H PRN Neetu Carrozza, [name], MD   50 mg at 07/18/21 2103  ? ibuprofen (ADVIL) tablet 600 mg  600 mg Oral Q6H PRN Jawanda Passey, [name], MD   600 mg at 06/27/21 0915  ? loperamide (IMODIUM) capsule 2 mg  2 mg Oral PRN Larita Fife, MD   2 mg at 07/03/21 1323  ? melatonin tablet 5 mg  5 mg Oral QHS Nesreen Albano, [name], MD   5 mg at 07/19/21 2206  ? midazolam (VERSED) injection 2 mg  2 mg Intravenous Once Homar Weinkauf T, MD      ? mirtazapine (REMERON) tablet 45 mg  45 mg Oral QHS Laiba Fuerte, [name], MD   45 mg at 07/19/21 2206  ? multivitamin with minerals tablet 1 tablet  1 tablet Oral Daily Caroll Cunnington, [name], MD   1 tablet at 07/20/21 8338  ? ondansetron (ZOFRAN) injection 4 mg  4 mg Intravenous Once PRN Arita Miss, MD      ? ondansetron [facility]) injection 4 mg  4 mg Intravenous Once Nyema Hachey, [name], MD      ? ondansetron (ZOFRAN-ODT) disintegrating tablet 4 mg  4 mg Oral Q4H PRN Alijah Akram, [name], MD      ? QUEtiapine (SEROQUEL) tablet 100 mg  100 mg Oral QHS Nona Gracey, [name], MD   100 mg at 07/19/21 2206  ? traZODone (DESYREL) tablet 100 mg  100 mg Oral QHS PRN Trenyce Loera, [name], MD   100 mg at 07/19/21 2206  ? venlafaxine XR (EFFEXOR-XR) 24 hr capsule 150 mg  150 mg Oral Q breakfast Saurav Crumble, [name], MD   150 mg at 07/20/21 2505  ? Vitamin D (Ergocalciferol) (DRISDOL) capsule 50,000 Units  50,000 Units Oral Q7 days Michol Emory, [name], MD   50,000 Units at 07/16/21 1648  ? ? ?Lab Results: No results found for this or any previous visit (from the past 48 hour(s)). ? ?Blood Alcohol level:  ?Lab Results  ?Component Value Date  ? ETH <10 06/07/2021  ? ETH <10 11/22/2020  ? ? ?Metabolic Disorder Labs: ?Lab Results  ?Component Value Date  ? HGBA1C 5.1 06/07/2021  ? MPG 99.67 06/07/2021  ? MPG 111.15 11/24/2020  ? ?No results found for: PROLACTIN ?Lab Results  ?Component Value Date  ? CHOL 197 06/07/2021  ? TRIG 149 06/07/2021  ? HDL 44 06/07/2021  ? CHOLHDL 4.5 06/07/2021  ? VLDL 30 06/07/2021  ? LDLCALC 123 (H) 06/07/2021  ? LDLCALC 148 (H) 11/24/2020  ? ? ?Physical Findings: ?AIMS:  , ,  ,  ,    ?CIWA:    ?COWS:    ? ?Musculoskeletal: ?Strength & Muscle Tone: within normal limits ?Gait & Station: normal ?Patient leans: N/A ? ?Psychiatric Specialty Exam: ? ?Presentation  ?General Appearance: Appropriate for Environment; Bizarre; Disheveled; Fairly Groomed ? ?Eye Contact:Good ? ?Speech:Clear and Coherent; Normal Rate ? ?Speech Volume:Normal ? ?Handedness:Right ? ? ?Mood and Affect  ?Mood:Anxious; Depressed; Hopeless;  Irritable ? ?Affect:Blunt; Depressed ? ? ?Thought Process  ?Thought Processes:Coherent; Linear ? ?  Descriptions of Associations:Intact ? ?Orientation:Full (Time, Place and Person) ? ?Thought Content:Logical; WDL ? ?History of Schizophrenia/Schizoaffective disorder:No ? ?Duration of Psychotic Symptoms:N/A ? ?Hallucinations:No data recorded ?Ideas of Reference:None ? ?Suicidal Thoughts:No data recorded ?Homicidal Thoughts:No data recorded ? ?Sensorium  ?Memory:Immediate Fair; Recent Fair; Remote Fair ? ?Judgment:Fair ? ?Insight:Fair ? ? ?Executive Functions  ?Concentration:Fair ? ?Attention Span:Good ? ?Recall:Good ? ?[place] ? ?Language:Good ? ? ?Psychomotor Activity  ?Psychomotor Activity:No data recorded ? ?Assets  ?Assets:Communication Skills; Physical Health; Social Support ? ? ?Sleep  ?Sleep:No data recorded ? ? ?Physical Exam: ?Physical Exam ?Vitals and nursing note reviewed.  ?Constitutional:   ?   Appearance: Normal appearance.  ?HENT:  ?   Head: Normocephalic and atraumatic.  ?   Mouth/Throat:  ?   Pharynx: Oropharynx is clear.  ?Eyes:  ?   Pupils: Pupils are equal, round, and reactive to light.  ?Cardiovascular:  ?   Rate and Rhythm: Normal rate and regular rhythm.  ?Pulmonary:  ?   Effort: Pulmonary effort is normal.  ?   Breath sounds: Normal breath sounds.  ?Abdominal:  ?   General: Abdomen is flat.  ?   Palpations: Abdomen is soft.  ?Musculoskeletal:     ?   General: Normal range of motion.  ?Skin: ?   General: Skin is warm and dry.  ?Neurological:  ?   General: No focal deficit present.  ?   Mental Status: He is alert. Mental status is at baseline.  ?Psychiatric:     ?   Attention and Perception: Attention normal.     ?   Mood and Affect: Mood normal.     ?   Speech: Speech is delayed.     ?   Behavior: Behavior is cooperative.     ?   Thought Content: Thought content normal.     ?   Cognition and Memory: Cognition is impaired. Memory is impaired.  ? ?Review of Systems  ?Constitutional:  Negative.   ?HENT: Negative.    ?Eyes: Negative.   ?Respiratory: Negative.    ?Cardiovascular: Negative.   ?Gastrointestinal: Negative.   ?Musculoskeletal: Negative.   ?Skin: Negative.   ?Neurological: N

## 2021-07-20 NOTE — Group Note (Signed)
[place] LCSW Group Therapy Note ? ? ?Group Date: 07/20/2021 ?Start Time: 1300 ?End Time: 1400 ? ? ?Type of Therapy/Topic:  Group Therapy:  Emotion Regulation ? ?Participation Level:  Minimal  ? ?Mood: Euthymic  ? ?Description of Group:   ? The purpose of this group is to assist patients in learning to regulate negative emotions and experience positive emotions. Patients will be guided to discuss ways in which they have been vulnerable to their negative emotions. These vulnerabilities will be juxtaposed with experiences of positive emotions or situations, and patients challenged to use positive emotions to combat negative ones. Special emphasis will be placed on coping with negative emotions in conflict situations, and patients will process healthy conflict resolution skills. ? ?Therapeutic Goals: ?Patient will identify two positive emotions or experiences to reflect on in order to balance out negative emotions:  ?Patient will label two or more emotions that they find the most difficult to experience:  ?Patient will be able to demonstrate positive conflict resolution skills through discussion or role plays:  ? ?Summary of Patient Progress: ? ? ?Patient was present for the entirety of group session. Patient participated in opening and closing remarks. However, patient did not contribute at all to the topic of discussion despite encouraged participation.  ? ? ? ?Therapeutic Modalities:   ?Cognitive Behavioral Therapy ?Feelings Identification ?Dialectical Behavioral Therapy ? ? ?Durenda Hurt, LCSWA ?

## 2021-07-20 NOTE — Plan of Care (Signed)
D: Pt alert and oriented. Pt rates depression 6/10, hopelessness 6/10, and anxiety 6/10. Pt goal: "Get through the day." Pt reports energy level as normal and concentration as being good. Pt reports sleep last night as being good. Pt did receive medications for sleep and did find them helpful. Pt denies experiencing any pain at this time. Pt denies experiencing any HI, or AVH at this time, however endorses SI w/o a plan and verbally contracts for safety.  ? ?Pt was observed active in the milieu. Pt changed linens on bed and was spoken to about the importance of daily hygiene. ? ?A: Scheduled medications administered to pt, per MD orders. Support and encouragement provided. Frequent verbal contact made. Routine safety checks conducted q15 minutes.  ? ?R: No adverse drug reactions noted. Pt verbally contracts for safety at this time. Pt compliant with medications and treatment plan. Pt interacts well with others on the unit. Pt remains safe at this time. Will continue to monitor.  ?Problem: Education: ?Goal: Knowledge of the prescribed therapeutic regimen will improve ?Outcome: Progressing ?  ?Problem: Activity: ?Goal: Interest or engagement in leisure activities will improve ?Outcome: Progressing ?  ?

## 2021-07-20 NOTE — Progress Notes (Signed)
D: Pt alert and oriented. Pt rates depression 0/10, hopelessness 0/10, and anxiety 0/10.  Pt reports energy level as good and concentration as being good. Pt denies experiencing any pain at this time. Pt denies experiencing any SI/HI, or AVH at this time.  ? ?A: Scheduled medications administered to pt, per MD orders. Support and encouragement provided. Frequent verbal contact made. Routine safety checks conducted q15 minutes.  ? ?R: No adverse drug reactions noted. Pt verbally contracts for safety at this time. Pt complaint with medications and treatment plan. Pt interacts well with others on the unit. Pt remains safe at this time. Will continue to monitor.    ?

## 2021-07-21 NOTE — Plan of Care (Signed)
D- Patient alert and oriented. Patient presents in a pleasant mood on assessment reporting that he slept good last night and had no complaints to voice to this Probation officer. Patient continues to endorse depression, anxiety, hopelessness and suicidal thoughts on his self-inventory. Patient stated "that's just the way I am", however, he does feel safe here and contracts for safety with this Probation officer. Patient denies HI/AVH and pain at this time. Patient's goal for today is "getting through the day", in which he will "apply myself", in order to achieve his goal. ? ?A- Scheduled medications administered to patient, per MD orders. Support and encouragement provided.  Routine safety checks conducted every 15 minutes.  Patient informed to notify staff with problems or concerns. ? ?R- No adverse drug reactions noted. Patient contracts for safety at this time. Patient compliant with medications and treatment plan. Patient receptive, calm, and cooperative. Patient interacts well with others on the unit. Patient remains safe at this time. ? ?Problem: Education: ?Goal: Utilization of techniques to improve thought processes will improve ?Outcome: Progressing ?Goal: Knowledge of the prescribed therapeutic regimen will improve ?Outcome: Progressing ?  ?Problem: Activity: ?Goal: Interest or engagement in leisure activities will improve ?Outcome: Progressing ?Goal: Imbalance in normal sleep/wake cycle will improve ?Outcome: Progressing ?  ?Problem: Coping: ?Goal: Coping ability will improve ?Description: PT IS PROGRESSING. ?Outcome: Progressing ?Goal: Will verbalize feelings ?Outcome: Progressing ?  ?Problem: Health Behavior/Discharge Planning: ?Goal: Ability to make decisions will improve ?Outcome: Progressing ?Goal: Compliance with therapeutic regimen will improve ?Outcome: Progressing ?  ?Problem: Role Relationship: ?Goal: Will demonstrate positive changes in social behaviors and relationships ?Outcome: Progressing ?  ?Problem:  Safety: ?Goal: Ability to disclose and discuss suicidal ideas will improve ?Outcome: Progressing ?Goal: Ability to identify and utilize support systems that promote safety will improve ?Outcome: Progressing ?  ?Problem: Self-Concept: ?Goal: Will verbalize positive feelings about self ?Outcome: Progressing ?Goal: Level of anxiety will decrease ?Outcome: Progressing ?  ?

## 2021-07-21 NOTE — Group Note (Signed)
[facility] LCSW Group Therapy Note ? ? ?Group Date: 07/21/2021 ?Start Time: 1315 ?End Time: 0388 ? ? ?Type of Therapy/Topic:  Group Therapy:  Balance in Life ? ?Participation Level:  None  ? ?Description of Group:   ? This group will address the concept of balance and how it feels and looks when one is unbalanced. Patients will be encouraged to process areas in their lives that are out of balance, and identify reasons for remaining unbalanced. Facilitators will guide patients utilizing problem- solving interventions to address and correct the stressor making their life unbalanced. Understanding and applying boundaries will be explored and addressed for obtaining  and maintaining a balanced life. Patients will be encouraged to explore ways to assertively make their unbalanced needs known to significant others in their lives, using other group members and facilitator for support and feedback. ? ?Therapeutic Goals: ?Patient will identify two or more emotions or situations they have that consume much of in their lives. ?Patient will identify signs/triggers that life has become out of balance:  ?Patient will identify two ways to set boundaries in order to achieve balance in their lives:  ?Patient will demonstrate ability to communicate their needs through discussion and/or role plays ? ?Summary of Patient Progress: ?Patient was present in group.  Patient did not engage in group discussion.  ? ? ?Therapeutic Modalities:   ?Cognitive Behavioral Therapy ?Solution-Focused Therapy ?Assertiveness Training ? ? ?Rozann Lesches, LCSW ?

## 2021-07-21 NOTE — Plan of Care (Signed)
?  Problem: Education: ?Goal: Utilization of techniques to improve thought processes will improve ?Outcome: Progressing ?Goal: Knowledge of the prescribed therapeutic regimen will improve ?Outcome: Progressing ?  ?Problem: Activity: ?Goal: Interest or engagement in leisure activities will improve ?Outcome: Progressing ?Goal: Imbalance in normal sleep/wake cycle will improve ?Outcome: Progressing ?  ?Problem: Coping: ?Goal: Coping ability will improve ?Description: PT IS PROGRESSING. ?Outcome: Progressing ?Goal: Will verbalize feelings ?Outcome: Progressing ?  ?Problem: Health Behavior/Discharge Planning: ?Goal: Ability to make decisions will improve ?Outcome: Progressing ?Goal: Compliance with therapeutic regimen will improve ?Outcome: Progressing ?  ?Problem: Role Relationship: ?Goal: Will demonstrate positive changes in social behaviors and relationships ?Outcome: Progressing ?  ?Problem: Safety: ?Goal: Ability to disclose and discuss suicidal ideas will improve ?Outcome: Progressing ?Goal: Ability to identify and utilize support systems that promote safety will improve ?Outcome: Progressing ?  ?Problem: Self-Concept: ?Goal: Will verbalize positive feelings about self ?Outcome: Progressing ?Goal: Level of anxiety will decrease ?Outcome: Progressing ?  ?

## 2021-07-21 NOTE — Progress Notes (Signed)
Patient has been active on the unit spending his evening in the day room watching TV and socializing with his peers.  He denies si hi avh at this encounter but continues to endorse anxiety and depression. He is med compliant this evening and received his meds without incident. Encouraged patient to seek staff with any questions or concerns that he may have. Will continue to monitor with q 15 minute safety checks. ? ? ? ? ?C Butler-Nicholson, LPN ?

## 2021-07-21 NOTE — Progress Notes (Signed)
Recreation Therapy Notes ? ?Date: 07/21/2021 ? ?Time: 10:50am   ? ?Location: Craft room    ? ?Behavioral response: Appropriate ? ?Intervention Topic: Coping Skills    ? ?Discussion/Intervention:  ?Group content on today was focused on coping skills. The group defined what coping skills are and when they normally use coping skills. Individuals described how they normally cope with thing and the coping skills they normally use. Patients expressed why it is important to cope with things and how not coping with things can affect you. The group participated in the intervention ?My coping box? and made coping boxes while adding coping skills they could use in the future to the box. ?Clinical Observations/Feedback: ?[name] an identified deep breathing as a coping skill he uses. Individual was social with peers and staff while participating in the intervention.    ?Eulia Hatcher LRT/CTRS  ? ? ? ? ? ? ? ? ?Karey Stucki ?07/21/2021 12:35 PM ?

## 2021-07-21 NOTE — Progress Notes (Signed)
[facility] MD Progress Note ? ?07/21/2021 1:32 PM ?Casey Silva  ?MRN:  [id number] ?Subjective: Follow-up for this patient with severe depression.  Responded to ECT but is now recovering with memory problems.  Saw him again today in his room.  He is getting up a little more but still stays too isolated.  I talked with him about how we confirmed that his apartment should be available and ask what his plans were for the future.  He talked vaguely about wanting to get a job but seemed to have little idea how to go about that.  He denies any current suicidal thoughts and is not presenting as depressed ?Principal Problem: Severe recurrent major depression with psychotic features ([place]) ?Diagnosis: Principal Problem: ?  Severe recurrent major depression with psychotic features ([place]) ?Active Problems: ?  Primary insomnia ?  GAD (generalized anxiety disorder) ?  Essential hypertension ? ?Total Time spent with patient: 30 minutes ? ?Past Psychiatric History: Past history of what appears to be severe recurrent depression now with some improvement with ECT ? ?Past Medical History:  ?Past Medical History:  ?Diagnosis Date  ? Anxiety   ? Asthma   ? Depression   ? History reviewed. No pertinent surgical history. ?Family History:  ?Family History  ?Problem Relation Age of Onset  ? Bipolar disorder Mother   ? ?Family Psychiatric  History: See previous ?Social History:  ?Social History  ? ?Substance and Sexual Activity  ?Alcohol Use No  ?   ?Social History  ? ?Substance and Sexual Activity  ?Drug Use No  ?  ?Social History  ? ?Socioeconomic History  ? Marital status: Single  ?  Spouse name: Not on file  ? Number of children: 1  ? Years of education: Not on file  ? Highest education level: GED or equivalent  ?Occupational History  ? Not on file  ?Tobacco Use  ? Smoking status: Never  ? Smokeless tobacco: Never  ?Vaping Use  ? Vaping Use: Unknown  ?Substance and Sexual Activity  ? Alcohol use: No  ? Drug use: No  ? Sexual activity: Not Currently   ?Other Topics Concern  ? Not on file  ?Social History Narrative  ? Marital status:  Single   Children: Daughter   Employment:  Unemployment. previous work for [employer].   Alcohol:  None   Drugs:  none   Exercise:  Regularly very   Religion:  Jewish  ? ?Social Determinants of Health  ? ?Financial Resource Strain: Not on file  ?Food Insecurity: Not on file  ?Transportation Needs: Not on file  ?Physical Activity: Not on file  ?Stress: Not on file  ?Social Connections: Not on file  ? ?Additional Social History:  ?  ?  ?  ?  ?  ?  ?  ?  ?  ?  ?  ? ?Sleep: Fair ? ?Appetite:  Fair ? ?Current Medications: ?Current Facility-Administered Medications  ?Medication Dose Route Frequency Provider Last Rate Last Admin  ? 0.9 %  sodium chloride infusion  500 mL Intravenous Once Narada Uzzle, [name], MD      ? acetaminophen (TYLENOL) tablet 650 mg  650 mg Oral Once PRN Darrin Nipper, MD      ? Or  ? acetaminophen (TYLENOL) 160 MG/5ML solution 325-650 mg  325-650 mg Oral Q4H PRN Darrin Nipper, MD      ? amLODipine (NORVASC) tablet 10 mg  10 mg Oral Daily Dantonio Justen, [name], MD   10 mg at 07/21/21 4098  ?  diclofenac Sodium (VOLTAREN) 1 % topical gel 2 g  2 g Topical Q12H PRN Karess Harner T, MD      ? feeding supplement (ENSURE ENLIVE / ENSURE PLUS) liquid 237 mL  237 mL Oral BID BM Ethne Jeon T, MD   237 mL at 07/21/21 1100  ? fentaNYL (SUBLIMAZE) injection 25 mcg  25 mcg Intravenous Q5 min PRN Molli Barrows, MD      ? hydrOXYzine (ATARAX) tablet 50 mg  50 mg Oral Q6H PRN Shaun Runyon, [name], MD   50 mg at 07/18/21 2103  ? ibuprofen (ADVIL) tablet 600 mg  600 mg Oral Q6H PRN Gurjot Brisco, [name], MD   600 mg at 06/27/21 0915  ? loperamide (IMODIUM) capsule 2 mg  2 mg Oral PRN Larita Fife, MD   2 mg at 07/03/21 1323  ? melatonin tablet 5 mg  5 mg Oral QHS Tippi Mccrae, [name], MD   5 mg at 07/20/21 2057  ? midazolam (VERSED) injection 2 mg  2 mg Intravenous Once Imanol Bihl T, MD      ? mirtazapine (REMERON) tablet 45 mg  45 mg Oral QHS  Nickolus Wadding, [name], MD   45 mg at 07/20/21 2058  ? multivitamin with minerals tablet 1 tablet  1 tablet Oral Daily Alexi Geibel, [name], MD   1 tablet at 07/21/21 4098  ? ondansetron (ZOFRAN) injection 4 mg  4 mg Intravenous Once PRN Arita Miss, MD      ? ondansetron [facility]) injection 4 mg  4 mg Intravenous Once Lyndon Chenoweth, [name], MD      ? ondansetron (ZOFRAN-ODT) disintegrating tablet 4 mg  4 mg Oral Q4H PRN Zanyiah Posten, [name], MD      ? QUEtiapine (SEROQUEL) tablet 100 mg  100 mg Oral QHS Zury Fazzino, [name], MD   100 mg at 07/20/21 2058  ? traZODone (DESYREL) tablet 100 mg  100 mg Oral QHS PRN Samil Mecham, [name], MD   100 mg at 07/19/21 2206  ? venlafaxine XR (EFFEXOR-XR) 24 hr capsule 150 mg  150 mg Oral Q breakfast Annison Birchard, [name], MD   150 mg at 07/21/21 0819  ? Vitamin D (Ergocalciferol) (DRISDOL) capsule 50,000 Units  50,000 Units Oral Q7 days Kalonji Zurawski, [name], MD   50,000 Units at 07/16/21 1648  ? ? ?Lab Results: No results found for this or any previous visit (from the past 48 hour(s)). ? ?Blood Alcohol level:  ?Lab Results  ?Component Value Date  ? ETH <10 06/07/2021  ? ETH <10 11/22/2020  ? ? ?Metabolic Disorder Labs: ?Lab Results  ?Component Value Date  ? HGBA1C 5.1 06/07/2021  ? MPG 99.67 06/07/2021  ? MPG 111.15 11/24/2020  ? ?No results found for: PROLACTIN ?Lab Results  ?Component Value Date  ? CHOL 197 06/07/2021  ? TRIG 149 06/07/2021  ? HDL 44 06/07/2021  ? CHOLHDL 4.5 06/07/2021  ? VLDL 30 06/07/2021  ? LDLCALC 123 (H) 06/07/2021  ? LDLCALC 148 (H) 11/24/2020  ? ? ?Physical Findings: ?AIMS:  , ,  ,  ,    ?CIWA:    ?COWS:    ? ?Musculoskeletal: ?Strength & Muscle Tone: within normal limits ?Gait & Station: normal ?Patient leans: N/A ? ?Psychiatric Specialty Exam: ? ?Presentation  ?General Appearance: Appropriate for Environment; Bizarre; Disheveled; Fairly Groomed ? ?Eye Contact:Good ? ?Speech:Clear and Coherent; Normal Rate ? ?Speech Volume:Normal ? ?Handedness:Right ? ? ?Mood and Affect  ?Mood:Anxious;  Depressed; Hopeless; Irritable ? ?Affect:Blunt; Depressed ? ? ?Thought  Process  ?Thought Processes:Coherent; Linear ? ?Descriptions of Associations:Intact ? ?Orientation:Full (Time, Place and Person) ? ?Thought Content:Logical; WDL ? ?History of Schizophrenia/Schizoaffective disorder:No ? ?Duration of Psychotic Symptoms:N/A ? ?Hallucinations:No data recorded ?Ideas of Reference:None ? ?Suicidal Thoughts:No data recorded ?Homicidal Thoughts:No data recorded ? ?Sensorium  ?Memory:Immediate Fair; Recent Fair; Remote Fair ? ?Judgment:Fair ? ?Insight:Fair ? ? ?Executive Functions  ?Concentration:Fair ? ?Attention Span:Good ? ?Recall:Good ? ?[place] ? ?Language:Good ? ? ?Psychomotor Activity  ?Psychomotor Activity:No data recorded ? ?Assets  ?Assets:Communication Skills; Physical Health; Social Support ? ? ?Sleep  ?Sleep:No data recorded ? ? ?Physical Exam: ?Physical Exam ?Vitals and nursing note reviewed.  ?Constitutional:   ?   Appearance: Normal appearance.  ?HENT:  ?   Head: Normocephalic and atraumatic.  ?   Mouth/Throat:  ?   Pharynx: Oropharynx is clear.  ?Eyes:  ?   Pupils: Pupils are equal, round, and reactive to light.  ?Cardiovascular:  ?   Rate and Rhythm: Normal rate and regular rhythm.  ?Pulmonary:  ?   Effort: Pulmonary effort is normal.  ?   Breath sounds: Normal breath sounds.  ?Abdominal:  ?   General: Abdomen is flat.  ?   Palpations: Abdomen is soft.  ?Musculoskeletal:     ?   General: Normal range of motion.  ?Skin: ?   General: Skin is warm and dry.  ?Neurological:  ?   General: No focal deficit present.  ?   Mental Status: He is alert. Mental status is at baseline.  ?Psychiatric:     ?   Attention and Perception: He is inattentive.     ?   Mood and Affect: Mood normal. Affect is blunt.     ?   Speech: Speech is delayed.     ?   Behavior: Behavior is slowed.     ?   Thought Content: Thought content normal.     ?   Cognition and Memory: Memory is impaired.  ? ?Review of Systems   ?Constitutional: Negative.   ?HENT: Negative.    ?Eyes: Negative.   ?Respiratory: Negative.    ?Cardiovascular: Negative.   ?Gastrointestinal: Negative.   ?Musculoskeletal: Negative.   ?Skin: Negative.   ?Neurologi

## 2021-07-22 NOTE — Plan of Care (Signed)
D- Patient alert and oriented. Patient presents in a pleasant mood on assessment stating that he slept well last night, but had complaints of diarrhea this morning. Patient did request medication to help with this. Patient continues to endorse "passing thoughts" of SI, however, he states that he feels safe on the unit. Patient also endorsed a little bit of depression, but reported that this is just how he is. Patient denies anxiety, HI/AVH, and pain at this time. Patient's goal for today is to "get through the day and feel better". ? ?A- Scheduled medications administered to patient, per MD orders. PRN medication for diarrhea was given. Support and encouragement provided.  Routine safety checks conducted every 15 minutes. Patient informed to notify staff with problems or concerns. ? ?R- No adverse drug reactions noted. Patient contracts for safety at this time. Patient compliant with medications and treatment plan. Patient receptive, calm, and cooperative. Patient interacts well with others on the unit. Patient remains safe at this time. ? ?Problem: Education: ?Goal: Utilization of techniques to improve thought processes will improve ?Outcome: Progressing ?Goal: Knowledge of the prescribed therapeutic regimen will improve ?Outcome: Progressing ?  ?Problem: Activity: ?Goal: Interest or engagement in leisure activities will improve ?Outcome: Progressing ?Goal: Imbalance in normal sleep/wake cycle will improve ?Outcome: Progressing ?  ?Problem: Coping: ?Goal: Coping ability will improve ?Description: PT IS PROGRESSING. ?Outcome: Progressing ?Goal: Will verbalize feelings ?Outcome: Progressing ?  ?Problem: Health Behavior/Discharge Planning: ?Goal: Ability to make decisions will improve ?Outcome: Progressing ?Goal: Compliance with therapeutic regimen will improve ?Outcome: Progressing ?  ?Problem: Role Relationship: ?Goal: Will demonstrate positive changes in social behaviors and relationships ?Outcome: Progressing ?   ?Problem: Safety: ?Goal: Ability to disclose and discuss suicidal ideas will improve ?Outcome: Progressing ?Goal: Ability to identify and utilize support systems that promote safety will improve ?Outcome: Progressing ?  ?Problem: Self-Concept: ?Goal: Will verbalize positive feelings about self ?Outcome: Progressing ?Goal: Level of anxiety will decrease ?Outcome: Progressing ?  ?

## 2021-07-22 NOTE — Progress Notes (Signed)
[facility] MD Progress Note ? ?07/22/2021 4:04 PM ?Casey Silva  ?MRN:  [id number] ?Subjective: Patient seen and chart reviewed.  Patient was in bed yet again today.  Today he tells me he is having "stomach issues".  When I ask him to describe what he called a "indigestion" although eventually he admitted that diarrhea was the bigger problem.  He seems to have loose stools rather frequently and were not sure what that is coming from.  He says he is emotionally feeling pretty stable but at the same time has not made much headway in getting himself ready for discharge ?Principal Problem: Severe recurrent major depression with psychotic features ([place]) ?Diagnosis: Principal Problem: ?  Severe recurrent major depression with psychotic features ([place]) ?Active Problems: ?  Primary insomnia ?  GAD (generalized anxiety disorder) ?  Essential hypertension ? ?Total Time spent with patient: 30 minutes ? ?Past Psychiatric History: Past history of depression ? ?Past Medical History:  ?Past Medical History:  ?Diagnosis Date  ? Anxiety   ? Asthma   ? Depression   ? History reviewed. No pertinent surgical history. ?Family History:  ?Family History  ?Problem Relation Age of Onset  ? Bipolar disorder Mother   ? ?Family Psychiatric  History: See previous ?Social History:  ?Social History  ? ?Substance and Sexual Activity  ?Alcohol Use No  ?   ?Social History  ? ?Substance and Sexual Activity  ?Drug Use No  ?  ?Social History  ? ?Socioeconomic History  ? Marital status: Single  ?  Spouse name: Not on file  ? Number of children: 1  ? Years of education: Not on file  ? Highest education level: GED or equivalent  ?Occupational History  ? Not on file  ?Tobacco Use  ? Smoking status: Never  ? Smokeless tobacco: Never  ?Vaping Use  ? Vaping Use: Unknown  ?Substance and Sexual Activity  ? Alcohol use: No  ? Drug use: No  ? Sexual activity: Not Currently  ?Other Topics Concern  ? Not on file  ?Social History Narrative  ? Marital status:  Single    Children: Daughter   Employment:  Unemployment. previous work for [employer].   Alcohol:  None   Drugs:  none   Exercise:  Regularly very   Religion:  Jewish  ? ?Social Determinants of Health  ? ?Financial Resource Strain: Not on file  ?Food Insecurity: Not on file  ?Transportation Needs: Not on file  ?Physical Activity: Not on file  ?Stress: Not on file  ?Social Connections: Not on file  ? ?Additional Social History:  ?  ?  ?  ?  ?  ?  ?  ?  ?  ?  ?  ? ?Sleep: Fair ? ?Appetite:  Fair ? ?Current Medications: ?Current Facility-Administered Medications  ?Medication Dose Route Frequency Provider Last Rate Last Admin  ? 0.9 %  sodium chloride infusion  500 mL Intravenous Once Semaja Lymon, [name], MD      ? acetaminophen (TYLENOL) tablet 650 mg  650 mg Oral Once PRN Darrin Nipper, MD      ? Or  ? acetaminophen (TYLENOL) 160 MG/5ML solution 325-650 mg  325-650 mg Oral Q4H PRN Darrin Nipper, MD      ? amLODipine (NORVASC) tablet 10 mg  10 mg Oral Daily Hibo Blasdell, [name], MD   10 mg at 07/22/21 0858  ? diclofenac Sodium (VOLTAREN) 1 % topical gel 2 g  2 g Topical Q12H PRN Chrislyn Seedorf, [name], MD      ?  feeding supplement (ENSURE ENLIVE / ENSURE PLUS) liquid 237 mL  237 mL Oral BID BM Zacary Bauer, [name], MD   237 mL at 07/22/21 1548  ? fentaNYL (SUBLIMAZE) injection 25 mcg  25 mcg Intravenous Q5 min PRN Molli Barrows, MD      ? hydrOXYzine (ATARAX) tablet 50 mg  50 mg Oral Q6H PRN Munachimso Rigdon, [name], MD   50 mg at 07/18/21 2103  ? ibuprofen (ADVIL) tablet 600 mg  600 mg Oral Q6H PRN Tekila Caillouet, [name], MD   600 mg at 06/27/21 0915  ? loperamide (IMODIUM) capsule 2 mg  2 mg Oral PRN Larita Fife, MD   2 mg at 07/22/21 0858  ? melatonin tablet 5 mg  5 mg Oral QHS Jalea Bronaugh, [name], MD   5 mg at 07/21/21 2103  ? midazolam (VERSED) injection 2 mg  2 mg Intravenous Once Quynn Vilchis T, MD      ? mirtazapine (REMERON) tablet 45 mg  45 mg Oral QHS Ricarda Atayde, [name], MD   45 mg at 07/21/21 2103  ? multivitamin with minerals tablet 1 tablet  1  tablet Oral Daily Jawaan Adachi, [name], MD   1 tablet at 07/22/21 0858  ? ondansetron (ZOFRAN) injection 4 mg  4 mg Intravenous Once PRN Arita Miss, MD      ? ondansetron [facility]) injection 4 mg  4 mg Intravenous Once Ameah Chanda, [name], MD      ? ondansetron (ZOFRAN-ODT) disintegrating tablet 4 mg  4 mg Oral Q4H PRN Harshan Kearley, [name], MD      ? QUEtiapine (SEROQUEL) tablet 100 mg  100 mg Oral QHS Hiya Point, [name], MD   100 mg at 07/21/21 2103  ? traZODone (DESYREL) tablet 100 mg  100 mg Oral QHS PRN  Sivertsen, [name], MD   100 mg at 07/21/21 2103  ? venlafaxine XR (EFFEXOR-XR) 24 hr capsule 150 mg  150 mg Oral Q breakfast Eevie Lapp, [name], MD   150 mg at 07/22/21 0858  ? Vitamin D (Ergocalciferol) (DRISDOL) capsule 50,000 Units  50,000 Units Oral Q7 days Vivan Vanderveer, [name], MD   50,000 Units at 07/16/21 1648  ? ? ?Lab Results: No results found for this or any previous visit (from the past 48 hour(s)). ? ?Blood Alcohol level:  ?Lab Results  ?Component Value Date  ? ETH <10 06/07/2021  ? ETH <10 11/22/2020  ? ? ?Metabolic Disorder Labs: ?Lab Results  ?Component Value Date  ? HGBA1C 5.1 06/07/2021  ? MPG 99.67 06/07/2021  ? MPG 111.15 11/24/2020  ? ?No results found for: PROLACTIN ?Lab Results  ?Component Value Date  ? CHOL 197 06/07/2021  ? TRIG 149 06/07/2021  ? HDL 44 06/07/2021  ? CHOLHDL 4.5 06/07/2021  ? VLDL 30 06/07/2021  ? LDLCALC 123 (H) 06/07/2021  ? LDLCALC 148 (H) 11/24/2020  ? ? ?Physical Findings: ?AIMS:  , ,  ,  ,    ?CIWA:    ?COWS:    ? ?Musculoskeletal: ?Strength & Muscle Tone: within normal limits ?Gait & Station: normal ?Patient leans: N/A ? ?Psychiatric Specialty Exam: ? ?Presentation  ?General Appearance: Appropriate for Environment; Bizarre; Disheveled; Fairly Groomed ? ?Eye Contact:Good ? ?Speech:Clear and Coherent; Normal Rate ? ?Speech Volume:Normal ? ?Handedness:Right ? ? ?Mood and Affect  ?Mood:Anxious; Depressed; Hopeless; Irritable ? ?Affect:Blunt; Depressed ? ? ?Thought Process  ?Thought  Processes:Coherent; Linear ? ?Descriptions of Associations:Intact ? ?Orientation:Full (Time, Place and Person) ? ?Thought Content:Logical; WDL ? ?History of Schizophrenia/Schizoaffective disorder:No ? ?  Duration of Psychotic Symptoms:N/A ? ?Hallucinations:No data recorded ?Ideas of Reference:None ? ?Suicidal Thoughts:No data recorded ?Homicidal Thoughts:No data recorded ? ?Sensorium  ?Memory:Immediate Fair; Recent Fair; Remote Fair ? ?Judgment:Fair ? ?Insight:Fair ? ? ?Executive Functions  ?Concentration:Fair ? ?Attention Span:Good ? ?Recall:Good ? ?[place] ? ?Language:Good ? ? ?Psychomotor Activity  ?Psychomotor Activity:No data recorded ? ?Assets  ?Assets:Communication Skills; Physical Health; Social Support ? ? ?Sleep  ?Sleep:No data recorded ? ? ?Physical Exam: ?Physical Exam ?Vitals and nursing note reviewed.  ?Constitutional:   ?   Appearance: Normal appearance.  ?HENT:  ?   Head: Normocephalic and atraumatic.  ?   Mouth/Throat:  ?   Pharynx: Oropharynx is clear.  ?Eyes:  ?   Pupils: Pupils are equal, round, and reactive to light.  ?Cardiovascular:  ?   Rate and Rhythm: Normal rate and regular rhythm.  ?Pulmonary:  ?   Effort: Pulmonary effort is normal.  ?   Breath sounds: Normal breath sounds.  ?Abdominal:  ?   General: Abdomen is flat.  ?   Palpations: Abdomen is soft.  ?Musculoskeletal:     ?   General: Normal range of motion.  ?Skin: ?   General: Skin is warm and dry.  ?Neurological:  ?   General: No focal deficit present.  ?   Mental Status: He is alert. Mental status is at baseline.  ?Psychiatric:     ?   Attention and Perception: Attention normal.     ?   Mood and Affect: Mood normal. Affect is blunt.     ?   Speech: Speech normal.     ?   Behavior: Behavior normal.     ?   Thought Content: Thought content normal.     ?   Cognition and Memory: Cognition normal.     ?   Judgment: Judgment normal.  ? ?Review of Systems  ?Constitutional: Negative.   ?HENT: Negative.    ?Eyes: Negative.    ?Respiratory: Negative.    ?Cardiovascular: Negative.   ?Gastrointestinal:  Positive for diarrhea.  ?Musculoskeletal: Negative.   ?Skin: Negative.   ?Neurological: Negative.   ?Psychiatric/Behavioral: Negative.    ?Blood pre

## 2021-07-22 NOTE — Progress Notes (Signed)
Recreation Therapy Notes ? ?Date: 07/22/2021 ? ?Time: 10:40 am   ? ?Location: Craft room   ? ?Behavioral response: Appropriate ? ?Intervention Topic: Relaxation   ? ?Discussion/Intervention:  ?Group content today was focused on relaxation. The group defined relaxation and identified healthy ways to relax. Individuals expressed how much time they spend relaxing. Patients expressed how much their life would be if they did not make time for themselves to relax. The group stated ways they could improve their relaxation techniques in the future.  Individuals participated in the intervention ?Time to Relax? where they had a chance to experience different relaxation techniques.  ?Clinical Observations/Feedback: ?[name] and defined relaxation as decompressing. He identified deep breathing as a technique that helps him relax. Individual was social with peers and staff while participating in the intervention.    ?Chandy Tarman LRT/CTRS  ? ? ? ? ? ? ? ?Calene Paradiso ?07/22/2021 12:35 PM ?

## 2021-07-22 NOTE — Progress Notes (Signed)
Patient was not given his Ensure because it was not available.  ?

## 2021-07-22 NOTE — Group Note (Signed)
[facility] LCSW Group Therapy Note ? ? ?Group Date: 07/22/2021 ?Start Time: 1300 ?End Time: 1400 ? ?Type of Therapy and Topic:  Group Therapy:  Feelings around Relapse and Recovery ? ?Participation Level:  Active  ? ? ?Description of Group:   ? Patients in this group will discuss emotions they experience before and after a relapse. They will process how experiencing these feelings, or avoidance of experiencing them, relates to having a relapse. Facilitator will guide patients to explore emotions they have related to recovery. Patients will be encouraged to process which emotions are more powerful. They will be guided to discuss the emotional reaction significant others in their lives may have to patients? relapse or recovery. Patients will be assisted in exploring ways to respond to the emotions of others without this contributing to a relapse. ? ?Therapeutic Goals: ?Patient will identify two or more emotions that lead to relapse for them:  ?Patient will identify two emotions that result when they relapse:  ?Patient will identify two emotions related to recovery:  ?Patient will demonstrate ability to communicate their needs through discussion and/or role plays. ? ? ?Summary of Patient Progress: ? ?Patient was present for the entirety of the group session. Patient was an active listener and participated in the topic of discussion, provided helpful advice to others, and added nuance to topic of conversation.  Patient shared that he has difficulty moderating his sleep. Patient sates he sleeps too much causing him to miss out on things in life.   ? ? ?Therapeutic Modalities:   ?Cognitive Behavioral Therapy ?Solution-Focused Therapy ?Assertiveness Training ?Relapse Prevention Therapy ? ? ?Durenda Hurt, LCSWA ?

## 2021-07-23 MED ORDER — FAMOTIDINE 20 MG PO TABS
20.0000 mg | ORAL_TABLET | Freq: Two times a day (BID) | ORAL | Status: DC
  Administered 2021-07-23 – 2021-08-03 (×22): 20 mg via ORAL
  Filled 2021-07-23 (×22): qty 1

## 2021-07-23 NOTE — Progress Notes (Signed)
[facility] MD Progress Note ? ?07/23/2021 11:34 AM ?Casey Silva  ?MRN:  [id number] ?Subjective: Follow-up for this 66 year old man recovering from depression.  Today he was once again in bed and this time told me that the problem is he is feeling physically sick.  He told me he felt like he had a flu.  Vitals unremarkable but he does look uncomfortable.  He says the Imodium took care of the worst of the diarrhea but his stomach is still upset.  Denied feeling depressed.  Denied suicidal thoughts.  Still pretty withdrawn. ?Principal Problem: Severe recurrent major depression with psychotic features ([place]) ?Diagnosis: Principal Problem: ?  Severe recurrent major depression with psychotic features ([place]) ?Active Problems: ?  Primary insomnia ?  GAD (generalized anxiety disorder) ?  Essential hypertension ? ?Total Time spent with patient: 30 minutes ? ?Past Psychiatric History: Past history of severe recurrent depression ? ?Past Medical History:  ?Past Medical History:  ?Diagnosis Date  ? Anxiety   ? Asthma   ? Depression   ? History reviewed. No pertinent surgical history. ?Family History:  ?Family History  ?Problem Relation Age of Onset  ? Bipolar disorder Mother   ? ?Family Psychiatric  History: See previous ?Social History:  ?Social History  ? ?Substance and Sexual Activity  ?Alcohol Use No  ?   ?Social History  ? ?Substance and Sexual Activity  ?Drug Use No  ?  ?Social History  ? ?Socioeconomic History  ? Marital status: Single  ?  Spouse name: Not on file  ? Number of children: 1  ? Years of education: Not on file  ? Highest education level: GED or equivalent  ?Occupational History  ? Not on file  ?Tobacco Use  ? Smoking status: Never  ? Smokeless tobacco: Never  ?Vaping Use  ? Vaping Use: Unknown  ?Substance and Sexual Activity  ? Alcohol use: No  ? Drug use: No  ? Sexual activity: Not Currently  ?Other Topics Concern  ? Not on file  ?Social History Narrative  ? Marital status:  Single   Children: Daughter   Employment:   Unemployment. previous work for [employer].   Alcohol:  None   Drugs:  none   Exercise:  Regularly very   Religion:  Jewish  ? ?Social Determinants of Health  ? ?Financial Resource Strain: Not on file  ?Food Insecurity: Not on file  ?Transportation Needs: Not on file  ?Physical Activity: Not on file  ?Stress: Not on file  ?Social Connections: Not on file  ? ?Additional Social History:  ?  ?  ?  ?  ?  ?  ?  ?  ?  ?  ?  ? ?Sleep: Fair ? ?Appetite:  Fair ? ?Current Medications: ?Current Facility-Administered Medications  ?Medication Dose Route Frequency Provider Last Rate Last Admin  ? 0.9 %  sodium chloride infusion  500 mL Intravenous Once Sherrice Creekmore, [name], MD      ? acetaminophen (TYLENOL) tablet 650 mg  650 mg Oral Once PRN Darrin Nipper, MD      ? Or  ? acetaminophen (TYLENOL) 160 MG/5ML solution 325-650 mg  325-650 mg Oral Q4H PRN Darrin Nipper, MD      ? amLODipine (NORVASC) tablet 10 mg  10 mg Oral Daily Farryn Linares, [name], MD   10 mg at 07/23/21 0800  ? diclofenac Sodium (VOLTAREN) 1 % topical gel 2 g  2 g Topical Q12H PRN Kenadie Royce, [name], MD      ? feeding  supplement (ENSURE ENLIVE / ENSURE PLUS) liquid 237 mL  237 mL Oral BID BM Jerrell Hart, [name], MD   237 mL at 07/22/21 1548  ? fentaNYL (SUBLIMAZE) injection 25 mcg  25 mcg Intravenous Q5 min PRN Molli Barrows, MD      ? hydrOXYzine (ATARAX) tablet 50 mg  50 mg Oral Q6H PRN Lisa-Marie Rueger, [name], MD   50 mg at 07/18/21 2103  ? ibuprofen (ADVIL) tablet 600 mg  600 mg Oral Q6H PRN Ehan Freas, [name], MD   600 mg at 06/27/21 0915  ? loperamide (IMODIUM) capsule 2 mg  2 mg Oral PRN Larita Fife, MD   2 mg at 07/22/21 0858  ? melatonin tablet 5 mg  5 mg Oral QHS Caydin Yeatts, [name], MD   5 mg at 07/22/21 2121  ? midazolam (VERSED) injection 2 mg  2 mg Intravenous Once Joyanna Kleman T, MD      ? mirtazapine (REMERON) tablet 45 mg  45 mg Oral QHS Haillie Radu, [name], MD   45 mg at 07/22/21 2121  ? multivitamin with minerals tablet 1 tablet  1 tablet Oral Daily Jannah Guardiola, [name], MD   1 tablet at 07/23/21 0800  ? ondansetron (ZOFRAN) injection 4 mg  4 mg Intravenous Once PRN Arita Miss, MD      ? ondansetron [facility]) injection 4 mg  4 mg Intravenous Once Maryclare Nydam, [name], MD      ? ondansetron (ZOFRAN-ODT) disintegrating tablet 4 mg  4 mg Oral Q4H PRN Aili Casillas, [name], MD      ? QUEtiapine (SEROQUEL) tablet 100 mg  100 mg Oral QHS Anders Hohmann, [name], MD   100 mg at 07/22/21 2121  ? traZODone (DESYREL) tablet 100 mg  100 mg Oral QHS PRN Timothy Townsel, [name], MD   100 mg at 07/22/21 2121  ? venlafaxine XR (EFFEXOR-XR) 24 hr capsule 150 mg  150 mg Oral Q breakfast Champayne Kocian T, MD   150 mg at 07/23/21 0800  ? Vitamin D (Ergocalciferol) (DRISDOL) capsule 50,000 Units  50,000 Units Oral Q7 days Eniyah Eastmond, [name], MD   50,000 Units at 07/16/21 1648  ? ? ?Lab Results: No results found for this or any previous visit (from the past 48 hour(s)). ? ?Blood Alcohol level:  ?Lab Results  ?Component Value Date  ? ETH <10 06/07/2021  ? ETH <10 11/22/2020  ? ? ?Metabolic Disorder Labs: ?Lab Results  ?Component Value Date  ? HGBA1C 5.1 06/07/2021  ? MPG 99.67 06/07/2021  ? MPG 111.15 11/24/2020  ? ?No results found for: PROLACTIN ?Lab Results  ?Component Value Date  ? CHOL 197 06/07/2021  ? TRIG 149 06/07/2021  ? HDL 44 06/07/2021  ? CHOLHDL 4.5 06/07/2021  ? VLDL 30 06/07/2021  ? LDLCALC 123 (H) 06/07/2021  ? LDLCALC 148 (H) 11/24/2020  ? ? ?Physical Findings: ?AIMS:  , ,  ,  ,    ?CIWA:    ?COWS:    ? ?Musculoskeletal: ?Strength & Muscle Tone: within normal limits ?Gait & Station: normal ?Patient leans: N/A ? ?Psychiatric Specialty Exam: ? ?Presentation  ?General Appearance: Appropriate for Environment; Bizarre; Disheveled; Fairly Groomed ? ?Eye Contact:Good ? ?Speech:Clear and Coherent; Normal Rate ? ?Speech Volume:Normal ? ?Handedness:Right ? ? ?Mood and Affect  ?Mood:Anxious; Depressed; Hopeless; Irritable ? ?Affect:Blunt; Depressed ? ? ?Thought Process  ?Thought Processes:Coherent; Linear ? ?Descriptions of  Associations:Intact ? ?Orientation:Full (Time, Place and Person) ? ?Thought Content:Logical; WDL ? ?History of Schizophrenia/Schizoaffective disorder:No ? ?Duration  of Psychotic Symptoms:N/A ? ?Hallucinations:No data recorded ?Ideas of Reference:None ? ?Suicidal Thoughts:No data recorded ?Homicidal Thoughts:No data recorded ? ?Sensorium  ?Memory:Immediate Fair; Recent Fair; Remote Fair ? ?Judgment:Fair ? ?Insight:Fair ? ? ?Executive Functions  ?Concentration:Fair ? ?Attention Span:Good ? ?Recall:Good ? ?[place] ? ?Language:Good ? ? ?Psychomotor Activity  ?Psychomotor Activity:No data recorded ? ?Assets  ?Assets:Communication Skills; Physical Health; Social Support ? ? ?Sleep  ?Sleep:No data recorded ? ? ?Physical Exam: ?Physical Exam ?Vitals and nursing note reviewed.  ?Constitutional:   ?   Appearance: Normal appearance. He is ill-appearing.  ?HENT:  ?   Head: Normocephalic and atraumatic.  ?   Mouth/Throat:  ?   Pharynx: Oropharynx is clear.  ?Eyes:  ?   Pupils: Pupils are equal, round, and reactive to light.  ?Cardiovascular:  ?   Rate and Rhythm: Normal rate and regular rhythm.  ?Pulmonary:  ?   Effort: Pulmonary effort is normal.  ?   Breath sounds: Normal breath sounds.  ?Abdominal:  ?   General: Abdomen is flat.  ?   Palpations: Abdomen is soft.  ?Musculoskeletal:     ?   General: Normal range of motion.  ?Skin: ?   General: Skin is warm and dry.  ?Neurological:  ?   General: No focal deficit present.  ?   Mental Status: He is alert. Mental status is at baseline.  ?Psychiatric:     ?   Attention and Perception: He is inattentive.     ?   Mood and Affect: Mood normal. Affect is blunt.     ?   Speech: He is noncommunicative.     ?   Behavior: Behavior is slowed.     ?   Thought Content: Thought content normal.     ?   Cognition and Memory: Memory is impaired.  ? ?Review of Systems  ?Constitutional: Negative.   ?HENT: Negative.    ?Eyes: Negative.   ?Respiratory: Negative.    ?Cardiovascular:  Negative.   ?Gastrointestinal:  Positive for abdominal pain, diarrhea and heartburn.  ?Musculoskeletal: Negative.   ?Skin: Negative.   ?Neurological: Negative.   ?Psychiatric/Behavioral:  Positive for memory

## 2021-07-23 NOTE — Group Note (Signed)
[facility] LCSW Group Therapy Note ? ? ?Group Date: 07/23/2021 ?Start Time: 1300 ?End Time: 1400 ? ? ?Type of Therapy and Topic: Group Therapy: Avoiding Self-Sabotaging and Enabling Behaviors ? ?Participation Level: Did Not Attend ? ?Mood: ? ?Description of Group:  ?In this group, patients will learn how to identify obstacles, self-sabotaging and enabling behaviors, as well as: what are they, why do we do them and what needs these behaviors meet. Discuss unhealthy relationships and how to have positive healthy boundaries with those that sabotage and enable. Explore aspects of self-sabotage and enabling in yourself and how to limit these self-destructive behaviors in everyday life. ? ? ?Therapeutic Goals: ?1. Patient will identify one obstacle that relates to self-sabotage and enabling behaviors ?2. Patient will identify one personal self-sabotaging or enabling behavior they did prior to admission ?3. Patient will state a plan to change the above identified behavior ?4. Patient will demonstrate ability to communicate their needs through discussion and/or role play.  ? ? ?Summary of Patient Progress: Patient did not attend group despite encouraged participation. ? ? ? ?Therapeutic Modalities:  ?Cognitive Behavioral Therapy ?Person-Centered Therapy ?Motivational Interviewing ? ? ? ?Kenna Gilbert Marilea Gwynne, LCSWA ?

## 2021-07-23 NOTE — Progress Notes (Signed)
Patient alert and oriented x 4, affect is flat but brightens upon approach, no distress noted. Patient denies pain no confusion noted, he was seen interacting appropriately with peers and staff and complaint with medication regimen, 15 minutes safety checks maintained will continue to closely monitor  ?

## 2021-07-23 NOTE — Plan of Care (Signed)
?  Problem: Education: ?Goal: Utilization of techniques to improve thought processes will improve ?Outcome: Progressing ?Goal: Knowledge of the prescribed therapeutic regimen will improve ?Outcome: Progressing ?  ?Problem: Activity: ?Goal: Interest or engagement in leisure activities will improve ?Outcome: Progressing ?Goal: Imbalance in normal sleep/wake cycle will improve ?Outcome: Progressing ?  ?Problem: Coping: ?Goal: Coping ability will improve ?Description: PT IS PROGRESSING. ?Outcome: Progressing ?Goal: Will verbalize feelings ?Outcome: Progressing ?  ?Problem: Health Behavior/Discharge Planning: ?Goal: Ability to make decisions will improve ?Outcome: Progressing ?Goal: Compliance with therapeutic regimen will improve ?Outcome: Progressing ?  ?Problem: Role Relationship: ?Goal: Will demonstrate positive changes in social behaviors and relationships ?Outcome: Progressing ?  ?Problem: Safety: ?Goal: Ability to disclose and discuss suicidal ideas will improve ?Outcome: Progressing ?Goal: Ability to identify and utilize support systems that promote safety will improve ?Outcome: Progressing ?  ?Problem: Self-Concept: ?Goal: Will verbalize positive feelings about self ?Outcome: Progressing ?Goal: Level of anxiety will decrease ?Outcome: Progressing ?  ?

## 2021-07-23 NOTE — Progress Notes (Signed)
Pt rates depression 6/10 and anxiety 5/10. Pt has some confusion about his past ECT. He was educated and then felt better. He ate all of his meals today and took a shower. Pt did not attend groups.Pt is safe. Collier Bullock RN ?

## 2021-07-24 MED ORDER — QUETIAPINE FUMARATE 200 MG PO TABS
200.0000 mg | ORAL_TABLET | Freq: Every day | ORAL | Status: DC
  Administered 2021-07-24: 200 mg via ORAL
  Filled 2021-07-24: qty 1

## 2021-07-24 NOTE — Progress Notes (Addendum)
Patient alert and oriented x 4 affect is flat but brightens upon approach, he denies SI/HI/AVH no distress noted he was noted interacting with peers and staff. Patient is complaint with medication regimen no distress noted. 15 minutes safety checks noted,  ?

## 2021-07-24 NOTE — Progress Notes (Signed)
Pt rates depression 7/10 and anxiety 6/10. Pt denies SI, HI and AVH. Pt is calm and cooperative however states that he feels "blah". He did attend one group today.Pt has a good appetite. He has mainly been in his room today resting or asleep.Pt is safe. Collier Bullock RN ?

## 2021-07-24 NOTE — Progress Notes (Signed)
[facility] MD Progress Note ? ?07/24/2021 11:10 AM ?Casey Silva  ?MRN:  [id number] ?Subjective: Follow-up 66 year old man with severe depression.  He continues to be spending time in bed all the time again and says that he feels generally sick.  He acknowledges that the Pepcid made a little bit of difference and that the diarrhea has gotten better but still feels tired and run down.  Claims that his mood and nerves feel okay.  Denies suicidal ideation.  Starting to look more like someone who is very run down and depressed again ?Principal Problem: Severe recurrent major depression with psychotic features ([place]) ?Diagnosis: Principal Problem: ?  Severe recurrent major depression with psychotic features ([place]) ?Active Problems: ?  Primary insomnia ?  GAD (generalized anxiety disorder) ?  Essential hypertension ? ?Total Time spent with patient: 30 minutes ? ?Past Psychiatric History: Past history of recurrent severe depression ? ?Past Medical History:  ?Past Medical History:  ?Diagnosis Date  ? Anxiety   ? Asthma   ? Depression   ? History reviewed. No pertinent surgical history. ?Family History:  ?Family History  ?Problem Relation Age of Onset  ? Bipolar disorder Mother   ? ?Family Psychiatric  History: See previous ?Social History:  ?Social History  ? ?Substance and Sexual Activity  ?Alcohol Use No  ?   ?Social History  ? ?Substance and Sexual Activity  ?Drug Use No  ?  ?Social History  ? ?Socioeconomic History  ? Marital status: Single  ?  Spouse name: Not on file  ? Number of children: 1  ? Years of education: Not on file  ? Highest education level: GED or equivalent  ?Occupational History  ? Not on file  ?Tobacco Use  ? Smoking status: Never  ? Smokeless tobacco: Never  ?Vaping Use  ? Vaping Use: Unknown  ?Substance and Sexual Activity  ? Alcohol use: No  ? Drug use: No  ? Sexual activity: Not Currently  ?Other Topics Concern  ? Not on file  ?Social History Narrative  ? Marital status:  Single   Children: Daughter    Employment:  Unemployment. previous work for [employer].   Alcohol:  None   Drugs:  none   Exercise:  Regularly very   Religion:  Jewish  ? ?Social Determinants of Health  ? ?Financial Resource Strain: Not on file  ?Food Insecurity: Not on file  ?Transportation Needs: Not on file  ?Physical Activity: Not on file  ?Stress: Not on file  ?Social Connections: Not on file  ? ?Additional Social History:  ?  ?  ?  ?  ?  ?  ?  ?  ?  ?  ?  ? ?Sleep: Fair ? ?Appetite:  Fair ? ?Current Medications: ?Current Facility-Administered Medications  ?Medication Dose Route Frequency Provider Last Rate Last Admin  ? 0.9 %  sodium chloride infusion  500 mL Intravenous Once Jaikob Borgwardt, [name], MD      ? acetaminophen (TYLENOL) tablet 650 mg  650 mg Oral Once PRN Darrin Nipper, MD      ? Or  ? acetaminophen (TYLENOL) 160 MG/5ML solution 325-650 mg  325-650 mg Oral Q4H PRN Darrin Nipper, MD      ? amLODipine (NORVASC) tablet 10 mg  10 mg Oral Daily Issai Werling, [name], MD   10 mg at 07/24/21 0813  ? diclofenac Sodium (VOLTAREN) 1 % topical gel 2 g  2 g Topical Q12H PRN Austin Herd, [name], MD      ? famotidine (  PEPCID) tablet 20 mg  20 mg Oral BID Jalexus Brett, [name], MD   20 mg at 07/24/21 4696  ? feeding supplement (ENSURE ENLIVE / ENSURE PLUS) liquid 237 mL  237 mL Oral BID BM Dewey Viens T, MD   237 mL at 07/23/21 1500  ? fentaNYL (SUBLIMAZE) injection 25 mcg  25 mcg Intravenous Q5 min PRN Molli Barrows, MD      ? hydrOXYzine (ATARAX) tablet 50 mg  50 mg Oral Q6H PRN Sebron Mcmahill, [name], MD   50 mg at 07/18/21 2103  ? ibuprofen (ADVIL) tablet 600 mg  600 mg Oral Q6H PRN [place], [name], MD   600 mg at 06/27/21 0915  ? loperamide (IMODIUM) capsule 2 mg  2 mg Oral PRN Larita Fife, MD   2 mg at 07/22/21 0858  ? melatonin tablet 5 mg  5 mg Oral QHS Takiya Belmares, [name], MD   5 mg at 07/23/21 2128  ? midazolam (VERSED) injection 2 mg  2 mg Intravenous Once Devonna Oboyle T, MD      ? mirtazapine (REMERON) tablet 45 mg  45 mg Oral QHS Marlow Berenguer, [name],  MD   45 mg at 07/23/21 2128  ? multivitamin with minerals tablet 1 tablet  1 tablet Oral Daily Illana Nolting, [name], MD   1 tablet at 07/24/21 0813  ? ondansetron (ZOFRAN) injection 4 mg  4 mg Intravenous Once PRN Arita Miss, MD      ? ondansetron [facility]) injection 4 mg  4 mg Intravenous Once Melroy Bougher, [name], MD      ? ondansetron (ZOFRAN-ODT) disintegrating tablet 4 mg  4 mg Oral Q4H PRN Farah Lepak, [name], MD      ? QUEtiapine (SEROQUEL) tablet 100 mg  100 mg Oral QHS Azlin Zilberman, [name], MD   100 mg at 07/23/21 2128  ? traZODone (DESYREL) tablet 100 mg  100 mg Oral QHS PRN Javante Nilsson, [name], MD   100 mg at 07/22/21 2121  ? venlafaxine XR (EFFEXOR-XR) 24 hr capsule 150 mg  150 mg Oral Q breakfast Javeria Briski, [name], MD   150 mg at 07/24/21 2952  ? Vitamin D (Ergocalciferol) (DRISDOL) capsule 50,000 Units  50,000 Units Oral Q7 days Anjelica Gorniak, [name], MD   50,000 Units at 07/23/21 1619  ? ? ?Lab Results: No results found for this or any previous visit (from the past 48 hour(s)). ? ?Blood Alcohol level:  ?Lab Results  ?Component Value Date  ? ETH <10 06/07/2021  ? ETH <10 11/22/2020  ? ? ?Metabolic Disorder Labs: ?Lab Results  ?Component Value Date  ? HGBA1C 5.1 06/07/2021  ? MPG 99.67 06/07/2021  ? MPG 111.15 11/24/2020  ? ?No results found for: PROLACTIN ?Lab Results  ?Component Value Date  ? CHOL 197 06/07/2021  ? TRIG 149 06/07/2021  ? HDL 44 06/07/2021  ? CHOLHDL 4.5 06/07/2021  ? VLDL 30 06/07/2021  ? LDLCALC 123 (H) 06/07/2021  ? LDLCALC 148 (H) 11/24/2020  ? ? ?Physical Findings: ?AIMS:  , ,  ,  ,    ?CIWA:    ?COWS:    ? ?Musculoskeletal: ?Strength & Muscle Tone: within normal limits ?Gait & Station: normal ?Patient leans: N/A ? ?Psychiatric Specialty Exam: ? ?Presentation  ?General Appearance: Appropriate for Environment; Bizarre; Disheveled; Fairly Groomed ? ?Eye Contact:Good ? ?Speech:Clear and Coherent; Normal Rate ? ?Speech Volume:Normal ? ?Handedness:Right ? ? ?Mood and Affect  ?Mood:Anxious; Depressed; Hopeless;  Irritable ? ?Affect:Blunt; Depressed ? ? ?Thought Process  ?Thought  Processes:Coherent; Linear ? ?Descriptions of Associations:Intact ? ?Orientation:Full (Time, Place and Person) ? ?Thought Content:Logical; WDL ? ?History of Schizophrenia/Schizoaffective disorder:No ? ?Duration of Psychotic Symptoms:N/A ? ?Hallucinations:No data recorded ?Ideas of Reference:None ? ?Suicidal Thoughts:No data recorded ?Homicidal Thoughts:No data recorded ? ?Sensorium  ?Memory:Immediate Fair; Recent Fair; Remote Fair ? ?Judgment:Fair ? ?Insight:Fair ? ? ?Executive Functions  ?Concentration:Fair ? ?Attention Span:Good ? ?Recall:Good ? ?[place] ? ?Language:Good ? ? ?Psychomotor Activity  ?Psychomotor Activity:No data recorded ? ?Assets  ?Assets:Communication Skills; Physical Health; Social Support ? ? ?Sleep  ?Sleep:No data recorded ? ? ?Physical Exam: ?Physical Exam ?Vitals and nursing note reviewed.  ?Constitutional:   ?   Appearance: Normal appearance. He is ill-appearing.  ?HENT:  ?   Head: Normocephalic and atraumatic.  ?   Mouth/Throat:  ?   Pharynx: Oropharynx is clear.  ?Eyes:  ?   Pupils: Pupils are equal, round, and reactive to light.  ?Cardiovascular:  ?   Rate and Rhythm: Normal rate and regular rhythm.  ?Pulmonary:  ?   Effort: Pulmonary effort is normal.  ?   Breath sounds: Normal breath sounds.  ?Abdominal:  ?   General: Abdomen is flat.  ?   Palpations: Abdomen is soft.  ?Musculoskeletal:     ?   General: Normal range of motion.  ?Skin: ?   General: Skin is warm and dry.  ?Neurological:  ?   General: No focal deficit present.  ?   Mental Status: He is alert. Mental status is at baseline.  ?Psychiatric:     ?   Attention and Perception: He is inattentive.     ?   Mood and Affect: Mood normal. Affect is blunt.     ?   Speech: Speech is delayed.     ?   Behavior: Behavior is slowed.     ?   Thought Content: Thought content normal.  ? ?Review of Systems  ?Constitutional:  Positive for malaise/fatigue.  ?HENT:  Negative.    ?Eyes: Negative.   ?Respiratory: Negative.    ?Cardiovascular: Negative.   ?Gastrointestinal: Negative.   ?Musculoskeletal: Negative.   ?Skin: Negative.   ?Neurological: Negative.   ?Psychiatric/Behavi

## 2021-07-24 NOTE — Group Note (Signed)
LCSW Group Therapy Note ? ?Group Date: 07/24/2021 ?Start Time: 1310 ?End Time: 1400 ? ? ?Type of Therapy and Topic:  Group Therapy - Healthy vs Unhealthy Coping Skills ? ?Participation Level:  Minimal  ? ?Description of Group ?The focus of this group was to determine what unhealthy coping techniques typically are used by group members and what healthy coping techniques would be helpful in coping with various problems. Patients were guided in becoming aware of the differences between healthy and unhealthy coping techniques. Patients were asked to identify 2-3 healthy coping skills they would like to learn to use more effectively. ? ?Therapeutic Goals ?Patients learned that coping is what human beings do all day long to deal with various situations in their lives ?Patients defined and discussed healthy vs unhealthy coping techniques ?Patients identified their preferred coping techniques and identified whether these were healthy or unhealthy ?Patients determined 2-3 healthy coping skills they would like to become more familiar with and use more often. ?Patients provided support and ideas to each other ? ? ?Summary of Patient Progress: Patient presented on time to group and participated in the opening remarks. Patient identified deep breathing as a helpful coping skill, sharing the method of deep breathing he finds helpful. Patient remained focused on listening the remainder of the session. ? ? ?Therapeutic Modalities ?Cognitive Behavioral Therapy ?Motivational Interviewing ? ?Kenna Gilbert Avanna Sowder, LCSWA ?07/24/2021  2:57 PM   ?

## 2021-07-24 NOTE — BH IP Treatment Plan (Signed)
Interdisciplinary Treatment and Diagnostic Plan Update ? ?07/24/2021 ?Time of Session: 11:00AM ?Casey Silva ?MRN: [id number] ? ?Principal Diagnosis: Severe recurrent major depression with psychotic features ([place]) ? ?Secondary Diagnoses: Principal Problem: ?  Severe recurrent major depression with psychotic features ([place]) ?Active Problems: ?  Primary insomnia ?  GAD (generalized anxiety disorder) ?  Essential hypertension ? ? ?Current Medications:  ?Current Facility-Administered Medications  ?Medication Dose Route Frequency Provider Last Rate Last Admin  ? 0.9 %  sodium chloride infusion  500 mL Intravenous Once Clapacs, [name], MD      ? acetaminophen (TYLENOL) tablet 650 mg  650 mg Oral Once PRN Darrin Nipper, MD      ? Or  ? acetaminophen (TYLENOL) 160 MG/5ML solution 325-650 mg  325-650 mg Oral Q4H PRN Darrin Nipper, MD      ? amLODipine (NORVASC) tablet 10 mg  10 mg Oral Daily Clapacs, [name], MD   10 mg at 07/24/21 0813  ? diclofenac Sodium (VOLTAREN) 1 % topical gel 2 g  2 g Topical Q12H PRN Clapacs, [name], MD      ? famotidine (PEPCID) tablet 20 mg  20 mg Oral BID Clapacs, [name], MD   20 mg at 07/24/21 6387  ? feeding supplement (ENSURE ENLIVE / ENSURE PLUS) liquid 237 mL  237 mL Oral BID BM Clapacs, [name], MD   237 mL at 07/23/21 1500  ? fentaNYL (SUBLIMAZE) injection 25 mcg  25 mcg Intravenous Q5 min PRN Molli Barrows, MD      ? hydrOXYzine (ATARAX) tablet 50 mg  50 mg Oral Q6H PRN Clapacs, [name], MD   50 mg at 07/18/21 2103  ? ibuprofen (ADVIL) tablet 600 mg  600 mg Oral Q6H PRN Clapacs, [name], MD   600 mg at 06/27/21 0915  ? loperamide (IMODIUM) capsule 2 mg  2 mg Oral PRN Larita Fife, MD   2 mg at 07/22/21 0858  ? melatonin tablet 5 mg  5 mg Oral QHS Clapacs, [name], MD   5 mg at 07/23/21 2128  ? midazolam (VERSED) injection 2 mg  2 mg Intravenous Once Clapacs, [name], MD      ? mirtazapine (REMERON) tablet 45 mg  45 mg Oral QHS Clapacs, [name], MD   45 mg at 07/23/21 2128  ? multivitamin with  minerals tablet 1 tablet  1 tablet Oral Daily Clapacs, [name], MD   1 tablet at 07/24/21 0813  ? ondansetron (ZOFRAN) injection 4 mg  4 mg Intravenous Once PRN Arita Miss, MD      ? ondansetron [facility]) injection 4 mg  4 mg Intravenous Once Clapacs, [name], MD      ? ondansetron (ZOFRAN-ODT) disintegrating tablet 4 mg  4 mg Oral Q4H PRN Clapacs, [name], MD      ? QUEtiapine (SEROQUEL) tablet 200 mg  200 mg Oral QHS Clapacs, [name], MD      ? traZODone (DESYREL) tablet 100 mg  100 mg Oral QHS PRN Clapacs, [name], MD   100 mg at 07/22/21 2121  ? venlafaxine XR (EFFEXOR-XR) 24 hr capsule 150 mg  150 mg Oral Q breakfast Clapacs, [name], MD   150 mg at 07/24/21 5643  ? Vitamin D (Ergocalciferol) (DRISDOL) capsule 50,000 Units  50,000 Units Oral Q7 days Clapacs, [name], MD   50,000 Units at 07/23/21 1619  ? ?PTA Medications: ?Medications Prior to Admission  ?Medication Sig Dispense Refill Last Dose  ?  amLODipine (NORVASC) 10 MG tablet Take 1 tablet (10 mg total) by mouth daily. 30 tablet 0 07/03/2021  ? gabapentin (NEURONTIN) 400 MG capsule Take 1 capsule (400 mg total) by mouth 3 (three) times daily. 90 capsule 0 07/03/2021  ? LORazepam (ATIVAN) 0.5 MG tablet Take 1 tablet (0.5 mg total) by mouth 2 (two) times daily as needed for anxiety. 30 tablet 0 07/03/2021  ? melatonin 5 MG TABS Take 1 tablet (5 mg total) by mouth at bedtime. 30 tablet 0 07/03/2021  ? mirtazapine (REMERON) 30 MG tablet Take 1 tablet (30 mg total) by mouth at bedtime. 30 tablet 3 07/03/2021  ? propranolol (INDERAL) 10 MG tablet Take 1 tablet (10 mg total) by mouth 3 (three) times daily. 90 tablet 0 07/03/2021  ? QUEtiapine (SEROQUEL) 100 MG tablet Take 1 tablet (100 mg total) by mouth at bedtime. 30 tablet 0 07/03/2021  ? QUEtiapine (SEROQUEL) 25 MG tablet Take 1 tablet (25 mg total) by mouth 2 (two) times daily. 60 tablet 0 07/03/2021  ? traZODone (DESYREL) 100 MG tablet Take 1 tablet (100 mg total) by mouth at bedtime. 30 tablet 0 07/03/2021  ? venlafaxine  XR (EFFEXOR-XR) 37.5 MG 24 hr capsule Take 3 capsules (112.5 mg total) by mouth daily with breakfast. 30 capsule 0 07/03/2021  ? Vitamin D, Ergocalciferol, (DRISDOL) 1.25 MG (50000 UNIT) CAPS capsule Take 1 capsule (50,000 Units total) by mouth every 7 (seven) days. 5 capsule 0 07/03/2021  ? ? ?Patient Stressors: Loss of connection with work, loss of sense of purpose   ?Other: loss of mental health   ? ?Patient Strengths: Ability for insight  ?Communication skills  ?General fund of knowledge  ?Motivation for treatment/growth  ? ?Treatment Modalities: Medication Management, Group therapy, Case management,  ?1 to 1 session with clinician, Psychoeducation, Recreational therapy. ? ? ?Physician Treatment Plan for Primary Diagnosis: Severe recurrent major depression with psychotic features ([place]) ?Long Term Goal(s): Improvement in symptoms so as ready for discharge  ? ?Short Term Goals: Ability to maintain clinical measurements within normal limits will improve ?Compliance with prescribed medications will improve ?Ability to verbalize feelings will improve ?Ability to disclose and discuss suicidal ideas ?Ability to demonstrate self-control will improve ? ?Medication Management: Evaluate patient's response, side effects, and tolerance of medication regimen. ? ?Therapeutic Interventions: 1 to 1 sessions, Unit Group sessions and Medication administration. ? ?Evaluation of Outcomes: Progressing ? ?Physician Treatment Plan for Secondary Diagnosis: Principal Problem: ?  Severe recurrent major depression with psychotic features ([place]) ?Active Problems: ?  Primary insomnia ?  GAD (generalized anxiety disorder) ?  Essential hypertension ? ?Long Term Goal(s): Improvement in symptoms so as ready for discharge  ? ?Short Term Goals: Ability to maintain clinical measurements within normal limits will improve ?Compliance with prescribed medications will improve ?Ability to verbalize feelings will improve ?Ability to disclose and discuss  suicidal ideas ?Ability to demonstrate self-control will improve    ? ?Medication Management: Evaluate patient's response, side effects, and tolerance of medication regimen. ? ?Therapeutic Interventions: 1 to 1 sessions, Unit Group sessions and Medication administration. ? ?Evaluation of Outcomes: Progressing ? ? ?RN Treatment Plan for Primary Diagnosis: Severe recurrent major depression with psychotic features ([place]) ?Long Term Goal(s): Knowledge of disease and therapeutic regimen to maintain health will improve ? ?Short Term Goals: Ability to remain free from injury will improve, Ability to verbalize frustration and anger appropriately will improve, Ability to demonstrate self-control, Ability to participate in decision making will improve, Ability to verbalize feelings  will improve, Ability to disclose and discuss suicidal ideas, Ability to identify and develop effective coping behaviors will improve, and Compliance with prescribed medications will improve ? ?Medication Management: RN will administer medications as ordered by provider, will assess and evaluate patient's response and provide education to patient for prescribed medication. RN will report any adverse and/or side effects to prescribing provider. ? ?Therapeutic Interventions: 1 on 1 counseling sessions, Psychoeducation, Medication administration, Evaluate responses to treatment, Monitor vital signs and CBGs as ordered, Perform/monitor CIWA, COWS, AIMS and Fall Risk screenings as ordered, Perform wound care treatments as ordered. ? ?Evaluation of Outcomes: Progressing ? ? ?LCSW Treatment Plan for Primary Diagnosis: Severe recurrent major depression with psychotic features ([place]) ?Long Term Goal(s): Safe transition to appropriate next level of care at discharge, Engage patient in therapeutic group addressing interpersonal concerns. ? ?Short Term Goals: Engage patient in aftercare planning with referrals and resources, Increase social support, Increase  ability to appropriately verbalize feelings, Increase emotional regulation, Facilitate acceptance of mental health diagnosis and concerns, Facilitate patient progression through stages of change regarding s

## 2021-07-25 MED ORDER — QUETIAPINE FUMARATE 100 MG PO TABS
100.0000 mg | ORAL_TABLET | Freq: Every day | ORAL | Status: DC
  Administered 2021-07-25: 100 mg via ORAL
  Filled 2021-07-25: qty 1

## 2021-07-25 MED ORDER — VENLAFAXINE HCL ER 75 MG PO CP24
225.0000 mg | ORAL_CAPSULE | Freq: Every day | ORAL | Status: DC
  Administered 2021-07-26 – 2021-08-03 (×9): 225 mg via ORAL
  Filled 2021-07-25 (×9): qty 3

## 2021-07-25 NOTE — Progress Notes (Signed)
Recreation Therapy Notes ? ?Date: 07/25/2021 ? ?Time: 10:30 am   ? ?Location: Craft room   ? ?Behavioral response: Appropriate ? ?Intervention Topic: Self-care  ? ?Discussion/Intervention:  ?Group content today was focused on Self-Care. The group defined self-care and some positive ways they care for themselves. Individuals expressed ways and reasons why they neglected any self-care in the past. Patients described ways to improve self-care in the future. The group explained what could happen if they did not do any self-care activities at all. The group participated in the intervention ?self-care assessment? where they had a chance to discover some of their weaknesses and strengths in self- care. Patient came up with a self-care plan to improve themselves in the future.  ?Clinical Observations/Feedback: ?[name] and was focused on what peers and staff had to say about self-care. Participant expressed that he participates in self-care by having the proper nutrition. Individual was social with peers and staff while participating in the intervention.    ?Casey Silva LRT/CTRS  ? ? ? ? ? ? ? ?Casey Silva ?07/25/2021 11:57 AM ?

## 2021-07-25 NOTE — Group Note (Signed)
[facility] LCSW Group Therapy Note ? ? ? ?Group Date: 07/25/2021 ?Start Time: 1300 ?End Time: 1400 ? ?Type of Therapy and Topic:  Group Therapy:  Overcoming Obstacles ? ?Participation Level:  [facility] PARTICIPATION LEVEL: Minimal ? ?Mood: ? ?Description of Group:   ?In this group patients will be encouraged to explore what they see as obstacles to their own wellness and recovery. They will be guided to discuss their thoughts, feelings, and behaviors related to these obstacles. The group will process together ways to cope with barriers, with attention given to specific choices patients can make. Each patient will be challenged to identify changes they are motivated to make in order to overcome their obstacles. This group will be process-oriented, with patients participating in exploration of their own experiences as well as giving and receiving support and challenge from other group members. ? ?Therapeutic Goals: ?1. Patient will identify personal and current obstacles as they relate to admission. ?2. Patient will identify barriers that currently interfere with their wellness or overcoming obstacles.  ?3. Patient will identify feelings, thought process and behaviors related to these barriers. ?4. Patient will identify two changes they are willing to make to overcome these obstacles:  ? ? ?Summary of Patient Progress ?Patient was present in group.  Patient appeared receptive to discussion  Patient shared that his obstacle has been lack of "family support".  He reports that he does not have any supports currently that can assist him.  He reports that his coping skill has been "do some research".  Patient was unable or unwilling to elaborate on this further.   ? ?Therapeutic Modalities:   ?Cognitive Behavioral Therapy ?Solution Focused Therapy ?Motivational Interviewing ?Relapse Prevention Therapy ? ? ?Rozann Lesches, LCSW ?

## 2021-07-25 NOTE — Progress Notes (Signed)
[facility] MD Progress Note ? ?07/25/2021 2:44 PM ?Casey Silva  ?MRN:  [id number] ?Subjective: Follow-up for this 66 year old man with recurrent severe depression.  Patient seen and chart reviewed.  I had the patient get up out of bed and come to my office today to sit down and try and go over his history a little more with the help of notes on the computer.  Patient did not know where he was initially.  He knew it was a hospital but did not know what town.  He told me that it felt like he had only been in our hospital about 8 or 9 days when in fact it has been a full month.  He did not remember anything about being in the hospital at [facility].  He tells me that he is still feeling depressed today feels somewhat hopeless.  Passive suicidal thoughts without specific plan.  Denies hallucinations denies and does not report any psychotic symptoms.  Currently medicine compliant with the Effexor mirtazapine and Seroquel primarily.  Also complains of physical symptoms saying that he feels tired all the time and dizzy in the morning.  Short-term memory is very poor. ?Principal Problem: Severe recurrent major depression with psychotic features ([place]) ?Diagnosis: Principal Problem: ?  Severe recurrent major depression with psychotic features ([place]) ?Active Problems: ?  Primary insomnia ?  GAD (generalized anxiety disorder) ?  Essential hypertension ? ?Total Time spent with patient: 30 minutes ? ?Past Psychiatric History: We went back over his history.  It sounds like his first real treatment for psychiatric illness was about 2 years ago in [place] where he presented in the summer 2021 with depression.  At that time they thought that he responded adequately in just a couple days in the hospital but in outpatient treatment sounds like he continued to report depressed symptoms.  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  Since he really has nothing else in his life no family and no friends no hobbies no activities nothing he particularly enjoys fairly the change in losing his job was a huge blow to him although ultimately I do not know which is driving the other more.  He does have a history of a suicide attempt or at least a partial one by slightly cutting his abdomen but no other suicide attempts. ? ?Past Medical History:  ?Past Medical History:  ?Diagnosis Date  ? Anxiety   ? Asthma   ? Depression   ? History reviewed. No pertinent surgical history. ?Family History:  ?Family History  ?Problem Relation Age of Onset  ? Bipolar disorder Mother   ? ?Family Psychiatric  History: Mother with bipolar disorder ?Social History:  ?Social History  ? ?Substance and Sexual Activity  ?Alcohol Use No  ?   ?Social History  ? ?Substance and Sexual Activity  ?Drug Use No  ?  ?Social History  ? ?Socioeconomic History  ? Marital status: Single  ?  Spouse name: Not on file  ? Number of children: 1  ? Years of education: Not on file  ? Highest education level: GED or equivalent  ?Occupational History  ? Not on file  ?Tobacco Use  ? Smoking status: Never  ? Smokeless tobacco: Never  ?Vaping Use  ? Vaping Use: Unknown  ?Substance and Sexual Activity  ? Alcohol  use: No  ? Drug use: No  ? Sexual activity: Not Currently  ?Other Topics Concern  ? Not on file  ?Social History Narrative  ? Marital status:  Single   Children: Daughter   Employment:  Unemployment. previous work for [employer].   Alcohol:  None   Drugs:  none   Exercise:  Regularly very   Religion:  Jewish  ? ?Social Determinants of Health  ? ?Financial Resource Strain: Not on file  ?Food Insecurity: Not on file  ?Transportation Needs: Not on file  ?Physical Activity: Not on file  ?Stress: Not on file  ?Social Connections: Not on file  ? ?Additional Social History:  ?  ?  ?  ?  ?  ?  ?  ?  ?  ?  ?  ? ?Sleep: Fair ? ?Appetite:  Fair ? ?Current  Medications: ?Current Facility-Administered Medications  ?Medication Dose Route Frequency Provider Last Rate Last Admin  ? 0.9 %  sodium chloride infusion  500 mL Intravenous Once Kristelle Cavallaro, [name], MD      ? acetaminophen (TYLENOL) tablet 650 mg  650 mg Oral Once PRN Darrin Nipper, MD      ? Or  ? acetaminophen (TYLENOL) 160 MG/5ML solution 325-650 mg  325-650 mg Oral Q4H PRN Darrin Nipper, MD      ? amLODipine (NORVASC) tablet 10 mg  10 mg Oral Daily Amara Manalang, [name], MD   10 mg at 07/25/21 0851  ? diclofenac Sodium (VOLTAREN) 1 % topical gel 2 g  2 g Topical Q12H PRN Marquel Pottenger T, MD      ? famotidine (PEPCID) tablet 20 mg  20 mg Oral BID Cailin Gebel, [name], MD   20 mg at 07/25/21 0851  ? feeding supplement (ENSURE ENLIVE / ENSURE PLUS) liquid 237 mL  237 mL Oral BID BM Nicklous Aburto T, MD   237 mL at 07/24/21 1700  ? fentaNYL (SUBLIMAZE) injection 25 mcg  25 mcg Intravenous Q5 min PRN Molli Barrows, MD      ? hydrOXYzine (ATARAX) tablet 50 mg  50 mg Oral Q6H PRN Anders Hohmann, [name], MD   50 mg at 07/18/21 2103  ? ibuprofen (ADVIL) tablet 600 mg  600 mg Oral Q6H PRN Kimble Delaurentis, [name], MD   600 mg at 06/27/21 0915  ? loperamide (IMODIUM) capsule 2 mg  2 mg Oral PRN Larita Fife, MD   2 mg at 07/22/21 0858  ? melatonin tablet 5 mg  5 mg Oral QHS Yamna Mackel, [name], MD   5 mg at 07/24/21 2124  ? midazolam (VERSED) injection 2 mg  2 mg Intravenous Once Dysen Edmondson T, MD      ? mirtazapine (REMERON) tablet 45 mg  45 mg Oral QHS Maayan Jenning, [name], MD   45 mg at 07/24/21 2124  ? multivitamin with minerals tablet 1 tablet  1 tablet Oral Daily Lesa Vandall, [name], MD   1 tablet at 07/25/21 0851  ? ondansetron (ZOFRAN) injection 4 mg  4 mg Intravenous Once PRN Arita Miss, MD      ? ondansetron [facility]) injection 4 mg  4 mg Intravenous Once Elward Nocera T, MD      ? ondansetron (ZOFRAN-ODT) disintegrating tablet 4 mg  4 mg Oral Q4H PRN Concepcion Kirkpatrick T, MD      ? QUEtiapine (SEROQUEL) tablet 100 mg  100 mg Oral QHS Siddh Vandeventer T,  MD      ? traZODone (DESYREL) tablet 100 mg  100 mg Oral QHS PRN Damere Brandenburg, [name], MD   100 mg at 07/24/21 2124  ? [START ON 07/26/2021] venlafaxine XR (EFFEXOR-XR) 24 hr capsule 225 mg  225 mg Oral Q breakfast Shykeem Resurreccion, [name], MD      ? Vitamin D (Ergocalciferol) (DRISDOL) capsule 50,000 Units  50,000 Units Oral Q7 days Xaine Sansom, [name], MD   50,000 Units at 07/23/21 1619  ? ? ?Lab Results: No results found for this or any previous visit (from the past 48 hour(s)). ? ?Blood Alcohol level:  ?Lab Results  ?Component Value Date  ? ETH <10 06/07/2021  ? ETH <10 11/22/2020  ? ? ?Metabolic Disorder Labs: ?Lab Results  ?Component Value Date  ? HGBA1C 5.1 06/07/2021  ? MPG 99.67 06/07/2021  ? MPG 111.15 11/24/2020  ? ?No results found for: PROLACTIN ?Lab Results  ?Component Value Date  ? CHOL 197 06/07/2021  ? TRIG 149 06/07/2021  ? HDL 44 06/07/2021  ? CHOLHDL 4.5 06/07/2021  ? VLDL 30 06/07/2021  ? LDLCALC 123 (H) 06/07/2021  ? LDLCALC 148 (H) 11/24/2020  ? ? ?Physical Findings: ?AIMS:  , ,  ,  ,    ?CIWA:    ?COWS:    ? ?Musculoskeletal: ?Strength & Muscle Tone: within normal limits ?Gait & Station: normal ?Patient leans: N/A ? ?Psychiatric Specialty Exam: ? ?Presentation  ?General Appearance: Appropriate for Environment; Bizarre; Disheveled; Fairly Groomed ? ?Eye Contact:Good ? ?Speech:Clear and Coherent; Normal Rate ? ?Speech Volume:Normal ? ?Handedness:Right ? ? ?Mood and Affect  ?Mood:Anxious; Depressed; Hopeless; Irritable ? ?Affect:Blunt; Depressed ? ? ?Thought Process  ?Thought Processes:Coherent; Linear ? ?Descriptions of Associations:Intact ? ?Orientation:Full (Time, Place and Person) ? ?Thought Content:Logical; WDL ? ?History of Schizophrenia/Schizoaffective disorder:No ? ?Duration of Psychotic Symptoms:N/A ? ?Hallucinations:No data recorded ?Ideas of Reference:None ? ?Suicidal Thoughts:No data recorded ?Homicidal Thoughts:No data recorded ? ?Sensorium  ?Memory:Immediate Fair; Recent Fair; Remote  Fair ? ?Judgment:Fair ? ?Insight:Fair ? ? ?Executive Functions  ?Concentration:Fair ? ?Attention Span:Good ? ?Recall:Good ? ?[place] ? ?Language:Good ? ? ?Psychomotor Activity  ?Psychomotor Activity:No data

## 2021-07-25 NOTE — Progress Notes (Signed)
Patient alert and oriented x 4 affect is flat but brightens upon approach, he denies SI/HI/AVH no distress noted this evening, he appeared well groomed and interacting with peers and staff. Patient is complaint with medication regimen. He was offered emotional support and encouragement. 15 minutes safety checks noted will continue to monitor.  ?

## 2021-07-25 NOTE — Plan of Care (Signed)
Patient pleasant and cooperative on approach. More visible in the milieu.Verbalized passive suicidal ideation with no plan. Patient states that he is doing good here but when he thinks that there is no place for him to go he feels hopeless.Denies HI and AVH. Attended groups. ADLs maintained. Appetite and energy level good. Support and encouragement given. ?

## 2021-07-26 LAB — BASIC METABOLIC PANEL
Anion gap: 10 (ref 5–15)
BUN: 21 mg/dL (ref 8–23)
CO2: 27 mmol/L (ref 22–32)
Calcium: 9 mg/dL (ref 8.9–10.3)
Chloride: 100 mmol/L (ref 98–111)
Creatinine, Ser: 1.06 mg/dL (ref 0.61–1.24)
GFR, Estimated: >60 mL/min (ref >60)
Glucose, Bld: 131 mg/dL — ABNORMAL HIGH (ref 70–99)
Potassium: 4.1 mmol/L (ref 3.5–5.1)
Sodium: 137 mmol/L (ref 135–145)

## 2021-07-26 LAB — TSH: TSH: 1.361 u[IU]/mL (ref 0.350–4.500)

## 2021-07-26 MED ORDER — QUETIAPINE FUMARATE 200 MG PO TABS
200.0000 mg | ORAL_TABLET | Freq: Every day | ORAL | Status: DC
  Administered 2021-07-26: 200 mg via ORAL
  Filled 2021-07-26: qty 1

## 2021-07-26 NOTE — Progress Notes (Signed)
Recreation Therapy Notes ? ?Date: 07/26/2021 ? ?Time: 10:05 am   ? ?Location: Courtyard     ? ?Behavioral response: N/A ?  ?Intervention Topic: Leisure    ? ?Discussion/Intervention: ?Patient refused to attend group.  ? ?Clinical Observations/Feedback:  ?Patient refused to attend group.  ?  ?Denene Alamillo LRT/CTRS ? ? ? ? ? ? ? ?Shaquille Janes ?07/26/2021 12:01 PM ?

## 2021-07-26 NOTE — Progress Notes (Signed)
Pt seems to have progressed more today. He has been out of the room more. Collier Bullock RN ?

## 2021-07-26 NOTE — Plan of Care (Signed)
Pt rates depression 7/10 and anxiety and hopelessness both 6/10. Pt denies HI and AVH. Pt has passive SI with no plan and verbally contracts for safety. Pt was educated on care plan and verbalizes understanding. Collier Bullock RN ?Problem: Education: ?Goal: Utilization of techniques to improve thought processes will improve ?Outcome: Progressing ?Goal: Knowledge of the prescribed therapeutic regimen will improve ?Outcome: Progressing ?  ?Problem: Activity: ?Goal: Interest or engagement in leisure activities will improve ?Outcome: Progressing ?Goal: Imbalance in normal sleep/wake cycle will improve ?Outcome: Progressing ?  ?Problem: Coping: ?Goal: Coping ability will improve ?Description: PT IS PROGRESSING. ?Outcome: Progressing ?Goal: Will verbalize feelings ?Outcome: Progressing ?  ?Problem: Health Behavior/Discharge Planning: ?Goal: Ability to make decisions will improve ?Outcome: Progressing ?Goal: Compliance with therapeutic regimen will improve ?Outcome: Progressing ?  ?Problem: Role Relationship: ?Goal: Will demonstrate positive changes in social behaviors and relationships ?Outcome: Progressing ?  ?Problem: Safety: ?Goal: Ability to disclose and discuss suicidal ideas will improve ?Outcome: Progressing ?Goal: Ability to identify and utilize support systems that promote safety will improve ?Outcome: Progressing ?  ?Problem: Self-Concept: ?Goal: Will verbalize positive feelings about self ?Outcome: Progressing ?Goal: Level of anxiety will decrease ?Outcome: Progressing ?  ?

## 2021-07-26 NOTE — Progress Notes (Signed)
[facility] MD Progress Note ? ?07/26/2021 12:35 PM ?Casey Silva  ?MRN:  [id number] ?Subjective: Patient seen and chart reviewed.  Once again patient is spending pretty much all of his time in bed.  Today when I ask him how he was feeling he told me he was very tired because he did not sleep last night.  He claims that he is catching up on sleep today.  Affect very blunted and rather negative.  Speech minimal.  Patient on his own told me that he was still having suicidal thoughts.  Denies hallucinations. ?Principal Problem: Severe recurrent major depression with psychotic features ([place]) ?Diagnosis: Principal Problem: ?  Severe recurrent major depression with psychotic features ([place]) ?Active Problems: ?  Primary insomnia ?  GAD (generalized anxiety disorder) ?  Essential hypertension ? ?Total Time spent with patient: 30 minutes ? ?Past Psychiatric History: Past history of recurrent depression ? ?Past Medical History:  ?Past Medical History:  ?Diagnosis Date  ? Anxiety   ? Asthma   ? Depression   ? History reviewed. No pertinent surgical history. ?Family History:  ?Family History  ?Problem Relation Age of Onset  ? Bipolar disorder Mother   ? ?Family Psychiatric  History: Bipolar disorder in his mother ?Social History:  ?Social History  ? ?Substance and Sexual Activity  ?Alcohol Use No  ?   ?Social History  ? ?Substance and Sexual Activity  ?Drug Use No  ?  ?Social History  ? ?Socioeconomic History  ? Marital status: Single  ?  Spouse name: Not on file  ? Number of children: 1  ? Years of education: Not on file  ? Highest education level: GED or equivalent  ?Occupational History  ? Not on file  ?Tobacco Use  ? Smoking status: Never  ? Smokeless tobacco: Never  ?Vaping Use  ? Vaping Use: Unknown  ?Substance and Sexual Activity  ? Alcohol use: No  ? Drug use: No  ? Sexual activity: Not Currently  ?Other Topics Concern  ? Not on file  ?Social History Narrative  ? Marital status:  Single   Children: Daughter   Employment:   Unemployment. previous work for [employer].   Alcohol:  None   Drugs:  none   Exercise:  Regularly very   Religion:  Jewish  ? ?Social Determinants of Health  ? ?Financial Resource Strain: Not on file  ?Food Insecurity: Not on file  ?Transportation Needs: Not on file  ?Physical Activity: Not on file  ?Stress: Not on file  ?Social Connections: Not on file  ? ?Additional Social History:  ?  ?  ?  ?  ?  ?  ?  ?  ?  ?  ?  ? ?Sleep: Poor ? ?Appetite:  Poor ? ?Current Medications: ?Current Facility-Administered Medications  ?Medication Dose Route Frequency Provider Last Rate Last Admin  ? acetaminophen (TYLENOL) tablet 650 mg  650 mg Oral Once PRN Darrin Nipper, MD      ? Or  ? acetaminophen (TYLENOL) 160 MG/5ML solution 325-650 mg  325-650 mg Oral Q4H PRN Darrin Nipper, MD      ? amLODipine (NORVASC) tablet 10 mg  10 mg Oral Daily Fread Kottke, [name], MD   10 mg at 07/26/21 6269  ? diclofenac Sodium (VOLTAREN) 1 % topical gel 2 g  2 g Topical Q12H PRN Ted Leonhart T, MD      ? famotidine (PEPCID) tablet 20 mg  20 mg Oral BID Alezander Dimaano, [name], MD   20 mg at  07/26/21 1610  ? feeding supplement (ENSURE ENLIVE / ENSURE PLUS) liquid 237 mL  237 mL Oral BID BM Zayvon Alicea T, MD   237 mL at 07/25/21 1430  ? hydrOXYzine (ATARAX) tablet 50 mg  50 mg Oral Q6H PRN Lajean Boese, [name], MD   50 mg at 07/18/21 2103  ? ibuprofen (ADVIL) tablet 600 mg  600 mg Oral Q6H PRN Nadina Fomby, [name], MD   600 mg at 06/27/21 0915  ? loperamide (IMODIUM) capsule 2 mg  2 mg Oral PRN Larita Fife, MD   2 mg at 07/22/21 0858  ? melatonin tablet 5 mg  5 mg Oral QHS Kevonte Vanecek T, MD   5 mg at 07/25/21 2116  ? mirtazapine (REMERON) tablet 45 mg  45 mg Oral QHS Chrystian Cupples, [name], MD   45 mg at 07/25/21 2116  ? multivitamin with minerals tablet 1 tablet  1 tablet Oral Daily Tera Pellicane, [name], MD   1 tablet at 07/26/21 9604  ? ondansetron (ZOFRAN-ODT) disintegrating tablet 4 mg  4 mg Oral Q4H PRN Rustyn Conery T, MD      ? QUEtiapine (SEROQUEL) tablet 200  mg  200 mg Oral QHS Wylie Russon T, MD      ? traZODone (DESYREL) tablet 100 mg  100 mg Oral QHS PRN Shannon Balthazar, [name], MD   100 mg at 07/25/21 2116  ? venlafaxine XR (EFFEXOR-XR) 24 hr capsule 225 mg  225 mg Oral Q breakfast Robby Bulkley, [name], MD   225 mg at 07/26/21 0809  ? Vitamin D (Ergocalciferol) (DRISDOL) capsule 50,000 Units  50,000 Units Oral Q7 days Rio Taber, [name], MD   50,000 Units at 07/23/21 1619  ? ? ?Lab Results: No results found for this or any previous visit (from the past 48 hour(s)). ? ?Blood Alcohol level:  ?Lab Results  ?Component Value Date  ? ETH <10 06/07/2021  ? ETH <10 11/22/2020  ? ? ?Metabolic Disorder Labs: ?Lab Results  ?Component Value Date  ? HGBA1C 5.1 06/07/2021  ? MPG 99.67 06/07/2021  ? MPG 111.15 11/24/2020  ? ?No results found for: PROLACTIN ?Lab Results  ?Component Value Date  ? CHOL 197 06/07/2021  ? TRIG 149 06/07/2021  ? HDL 44 06/07/2021  ? CHOLHDL 4.5 06/07/2021  ? VLDL 30 06/07/2021  ? LDLCALC 123 (H) 06/07/2021  ? LDLCALC 148 (H) 11/24/2020  ? ? ?Physical Findings: ?AIMS:  , ,  ,  ,    ?CIWA:    ?COWS:    ? ?Musculoskeletal: ?Strength & Muscle Tone: within normal limits ?Gait & Station: normal ?Patient leans: N/A ? ?Psychiatric Specialty Exam: ? ?Presentation  ?General Appearance: Appropriate for Environment; Bizarre; Disheveled; Fairly Groomed ? ?Eye Contact:Good ? ?Speech:Clear and Coherent; Normal Rate ? ?Speech Volume:Normal ? ?Handedness:Right ? ? ?Mood and Affect  ?Mood:Anxious; Depressed; Hopeless; Irritable ? ?Affect:Blunt; Depressed ? ? ?Thought Process  ?Thought Processes:Coherent; Linear ? ?Descriptions of Associations:Intact ? ?Orientation:Full (Time, Place and Person) ? ?Thought Content:Logical; WDL ? ?History of Schizophrenia/Schizoaffective disorder:No ? ?Duration of Psychotic Symptoms:N/A ? ?Hallucinations:No data recorded ?Ideas of Reference:None ? ?Suicidal Thoughts:No data recorded ?Homicidal Thoughts:No data recorded ? ?Sensorium  ?Memory:Immediate  Fair; Recent Fair; Remote Fair ? ?Judgment:Fair ? ?Insight:Fair ? ? ?Executive Functions  ?Concentration:Fair ? ?Attention Span:Good ? ?Recall:Good ? ?[place] ? ?Language:Good ? ? ?Psychomotor Activity  ?Psychomotor Activity:No data recorded ? ?Assets  ?Assets:Communication Skills; Physical Health; Social Support ? ? ?Sleep  ?Sleep:No data recorded ? ? ?Physical Exam: ?Physical Exam ?  Vitals and nursing note reviewed.  ?Constitutional:   ?   Appearance: Normal appearance.  ?HENT:  ?   Head: Normocephalic and atraumatic.  ?   Mouth/Throat:  ?   Pharynx: Oropharynx is clear.  ?Eyes:  ?   Pupils: Pupils are equal, round, and reactive to light.  ?Cardiovascular:  ?   Rate and Rhythm: Normal rate and regular rhythm.  ?Pulmonary:  ?   Effort: Pulmonary effort is normal.  ?   Breath sounds: Normal breath sounds.  ?Abdominal:  ?   General: Abdomen is flat.  ?   Palpations: Abdomen is soft.  ?Musculoskeletal:     ?   General: Normal range of motion.  ?Skin: ?   General: Skin is warm and dry.  ?Neurological:  ?   General: No focal deficit present.  ?   Mental Status: He is alert. Mental status is at baseline.  ?Psychiatric:     ?   Attention and Perception: He is inattentive.     ?   Mood and Affect: Mood is depressed. Affect is blunt.     ?   Speech: Speech is delayed.     ?   Behavior: Behavior is slowed.     ?   Thought Content: Thought content includes suicidal ideation. Thought content does not include suicidal plan.     ?   Cognition and Memory: Cognition is impaired. Memory is impaired.  ? ?Review of Systems  ?Constitutional: Negative.   ?HENT: Negative.    ?Eyes: Negative.   ?Respiratory: Negative.    ?Cardiovascular: Negative.   ?Gastrointestinal: Negative.   ?Musculoskeletal: Negative.   ?Skin: Negative.   ?Neurological: Negative.   ?Psychiatric/Behavioral:  Positive for depression, memory loss and suicidal ideas. Negative for hallucinations and substance abuse. The patient is nervous/anxious and has  insomnia.   ?Blood pressure 129/86, pulse 74, temperature 98.6 ?F (37 ?C), temperature source Oral, resp. rate 18, height '5\' 6"'$  (1.676 m), weight 76.2 kg, SpO2 98 %. Body mass index is 27.12 kg/m?. ? ? ?Treatment

## 2021-07-26 NOTE — Group Note (Signed)
LCSW Group Therapy Note ? ?Group Date: 07/26/2021 ?Start Time: 1300 ?End Time: 1400 ? ? ?Type of Therapy and Topic:  Group Therapy - How To Cope with Nervousness about Discharge  ? ?Participation Level:  None  ? ?Description of Group ?This process group involved identification of patients' feelings about discharge. Some of them are scheduled to be discharged soon, while others are new admissions, but each of them was asked to share thoughts and feelings surrounding discharge from the hospital. One common theme was that they are excited at the prospect of going home, while another was that many of them are apprehensive about sharing why they were hospitalized. Patients were given the opportunity to discuss these feelings with their peers in preparation for discharge. ? ?Therapeutic Goals ? ?Patient will identify their overall feelings about pending discharge. ?Patient will think about how they might proactively address issues that they believe will once again arise once they get home (i.e. with parents). ?Patients will participate in discussion about having hope for change. ? ? ?Summary of Patient Progress:   Patient removed from group by nursing staff; returned w/ roughly 5 minutes remaining in group.  ? ? ?Therapeutic Modalities ?Cognitive Behavioral Therapy ? ? ?Durenda Hurt, LCSWA ?07/26/2021  2:34 PM   ?

## 2021-07-26 NOTE — Progress Notes (Signed)
Patient is pleasant and cooperative.  He is active on the unit and gets along with his peers.  He is med compliant.  Continues to endorse passive si and depression, but rates that he is feeling better. Will continue to monitor with q15 minute safety rounds.  ? ?C Butler-Nicholson, LPN ?

## 2021-07-26 NOTE — Plan of Care (Signed)
?  Problem: Education: ?Goal: Utilization of techniques to improve thought processes will improve ?Outcome: Progressing ?Goal: Knowledge of the prescribed therapeutic regimen will improve ?Outcome: Progressing ?  ?Problem: Activity: ?Goal: Interest or engagement in leisure activities will improve ?Outcome: Progressing ?Goal: Imbalance in normal sleep/wake cycle will improve ?Outcome: Progressing ?  ?Problem: Coping: ?Goal: Coping ability will improve ?Description: PT IS PROGRESSING. ?Outcome: Progressing ?Goal: Will verbalize feelings ?Outcome: Progressing ?  ?Problem: Health Behavior/Discharge Planning: ?Goal: Ability to make decisions will improve ?Outcome: Progressing ?Goal: Compliance with therapeutic regimen will improve ?Outcome: Progressing ?  ?Problem: Role Relationship: ?Goal: Will demonstrate positive changes in social behaviors and relationships ?Outcome: Progressing ?  ?Problem: Safety: ?Goal: Ability to disclose and discuss suicidal ideas will improve ?Outcome: Progressing ?Goal: Ability to identify and utilize support systems that promote safety will improve ?Outcome: Progressing ?  ?Problem: Self-Concept: ?Goal: Will verbalize positive feelings about self ?Outcome: Progressing ?Goal: Level of anxiety will decrease ?Outcome: Progressing ?  ?

## 2021-07-27 MED ORDER — QUETIAPINE FUMARATE 200 MG PO TABS
300.0000 mg | ORAL_TABLET | Freq: Every day | ORAL | Status: DC
  Administered 2021-07-27 – 2021-08-02 (×7): 300 mg via ORAL
  Filled 2021-07-27 (×7): qty 1

## 2021-07-27 MED ORDER — LITHIUM CARBONATE ER 300 MG PO TBCR
300.0000 mg | EXTENDED_RELEASE_TABLET | Freq: Two times a day (BID) | ORAL | Status: DC
  Administered 2021-07-27 – 2021-08-01 (×11): 300 mg via ORAL
  Filled 2021-07-27 (×11): qty 1

## 2021-07-27 NOTE — Progress Notes (Signed)
[facility] MD Progress Note ? ?07/27/2021 11:43 AM ?Casey Silva  ?MRN:  [id number] ?Subjective: Follow-up 66 year old man with severe depression.  Patient seen and chart reviewed.  From what I am seeing he is regressing and is spending even more time in bed.  Came to see him late morning today and he claimed that he was trying to "catch up on sleep" because he did not sleep enough last night.  Admitted he was still "severely depressed".  Has suicidal thoughts without specific plan.  Feels hopeless.  Unable to articulate any idea about what would help him to feel better.  Labs were reviewed.  Nothing remarkable about his chemistry panel or TSH which were drawn in anticipation of starting lithium. ?Principal Problem: Severe recurrent major depression with psychotic features ([place]) ?Diagnosis: Principal Problem: ?  Severe recurrent major depression with psychotic features ([place]) ?Active Problems: ?  Primary insomnia ?  GAD (generalized anxiety disorder) ?  Essential hypertension ? ?Total Time spent with patient: 30 minutes ? ?Past Psychiatric History: Past history of recurrent depression although things seem to have certainly been much worse ever since he stopped working.  Has had ECT during this hospitalization with only slight improvement and quick regression ? ?Past Medical History:  ?Past Medical History:  ?Diagnosis Date  ? Anxiety   ? Asthma   ? Depression   ? History reviewed. No pertinent surgical history. ?Family History:  ?Family History  ?Problem Relation Age of Onset  ? Bipolar disorder Mother   ? ?Family Psychiatric  History: Bipolar in mother ?Social History:  ?Social History  ? ?Substance and Sexual Activity  ?Alcohol Use No  ?   ?Social History  ? ?Substance and Sexual Activity  ?Drug Use No  ?  ?Social History  ? ?Socioeconomic History  ? Marital status: Single  ?  Spouse name: Not on file  ? Number of children: 1  ? Years of education: Not on file  ? Highest education level: GED or equivalent  ?Occupational  History  ? Not on file  ?Tobacco Use  ? Smoking status: Never  ? Smokeless tobacco: Never  ?Vaping Use  ? Vaping Use: Unknown  ?Substance and Sexual Activity  ? Alcohol use: No  ? Drug use: No  ? Sexual activity: Not Currently  ?Other Topics Concern  ? Not on file  ?Social History Narrative  ? Marital status:  Single   Children: Daughter   Employment:  Unemployment. previous work for [employer].   Alcohol:  None   Drugs:  none   Exercise:  Regularly very   Religion:  Jewish  ? ?Social Determinants of Health  ? ?Financial Resource Strain: Not on file  ?Food Insecurity: Not on file  ?Transportation Needs: Not on file  ?Physical Activity: Not on file  ?Stress: Not on file  ?Social Connections: Not on file  ? ?Additional Social History:  ?  ?  ?  ?  ?  ?  ?  ?  ?  ?  ?  ? ?Sleep: Fair ? ?Appetite:  Fair ? ?Current Medications: ?Current Facility-Administered Medications  ?Medication Dose Route Frequency Provider Last Rate Last Admin  ? acetaminophen (TYLENOL) tablet 650 mg  650 mg Oral Once PRN Darrin Nipper, MD      ? Or  ? acetaminophen (TYLENOL) 160 MG/5ML solution 325-650 mg  325-650 mg Oral Q4H PRN Darrin Nipper, MD      ? amLODipine (NORVASC) tablet 10 mg  10 mg Oral Daily Benton Tooker, [name],  MD   10 mg at 07/27/21 0817  ? diclofenac Sodium (VOLTAREN) 1 % topical gel 2 g  2 g Topical Q12H PRN Jocob Dambach T, MD      ? famotidine (PEPCID) tablet 20 mg  20 mg Oral BID Lashanti Chambless, [name], MD   20 mg at 07/27/21 0817  ? feeding supplement (ENSURE ENLIVE / ENSURE PLUS) liquid 237 mL  237 mL Oral BID BM Velta Rockholt T, MD   237 mL at 07/27/21 1028  ? hydrOXYzine (ATARAX) tablet 50 mg  50 mg Oral Q6H PRN Jenica Costilow, [name], MD   50 mg at 07/18/21 2103  ? ibuprofen (ADVIL) tablet 600 mg  600 mg Oral Q6H PRN Irene Collings, [name], MD   600 mg at 06/27/21 0915  ? lithium carbonate (LITHOBID) CR tablet 300 mg  300 mg Oral Q12H Nasier Thumm T, MD      ? loperamide (IMODIUM) capsule 2 mg  2 mg Oral PRN Larita Fife, MD   2 mg at  07/22/21 0858  ? melatonin tablet 5 mg  5 mg Oral QHS Henreitta Spittler T, MD   5 mg at 07/26/21 2114  ? mirtazapine (REMERON) tablet 45 mg  45 mg Oral QHS Shaylah Mcghie, [name], MD   45 mg at 07/26/21 2114  ? multivitamin with minerals tablet 1 tablet  1 tablet Oral Daily Jeweldean Drohan, [name], MD   1 tablet at 07/27/21 0817  ? ondansetron (ZOFRAN-ODT) disintegrating tablet 4 mg  4 mg Oral Q4H PRN Piera Downs T, MD      ? QUEtiapine (SEROQUEL) tablet 300 mg  300 mg Oral QHS Zaakirah Kistner T, MD      ? traZODone (DESYREL) tablet 100 mg  100 mg Oral QHS PRN Alson Mcpheeters T, MD   100 mg at 07/26/21 2114  ? venlafaxine XR (EFFEXOR-XR) 24 hr capsule 225 mg  225 mg Oral Q breakfast Nicolle Heward, [name], MD   225 mg at 07/27/21 0817  ? Vitamin D (Ergocalciferol) (DRISDOL) capsule 50,000 Units  50,000 Units Oral Q7 days Cionna Collantes, [name], MD   50,000 Units at 07/23/21 1619  ? ? ?Lab Results:  ?Results for orders placed or performed during the hospital encounter of 06/23/21 (from the past 48 hour(s))  ?TSH     Status: None  ? Collection Time: 07/26/21  1:36 PM  ?Result Value Ref Range  ? TSH 1.361 0.350 - 4.500 uIU/mL  ?  Comment: Performed by a 3rd Generation assay with a functional sensitivity of <=0.01 uIU/mL. ?Performed at [facility], [address] [place], [place] [address] ?  ?Basic metabolic panel     Status: Abnormal  ? Collection Time: 07/26/21  1:36 PM  ?Result Value Ref Range  ? Sodium 137 135 - 145 mmol/L  ? Potassium 4.1 3.5 - 5.1 mmol/L  ? Chloride 100 98 - 111 mmol/L  ? CO2 27 22 - 32 mmol/L  ? Glucose, Bld 131 (H) 70 - 99 mg/dL  ?  Comment: Glucose reference range applies only to samples taken after fasting for at least 8 hours.  ? BUN 21 8 - 23 mg/dL  ? Creatinine, Ser 1.06 0.61 - 1.24 mg/dL  ? Calcium 9.0 8.9 - 10.3 mg/dL  ? GFR, Estimated >60 >60 mL/min  ?  Comment: (NOTE) ?Calculated using the CKD-EPI Creatinine Equation (2021) ?  ? Anion gap 10 5 - 15  ?  Comment: Performed at [facility], [address] [place],  [place] [address]  ? ? ?Blood Alcohol level:  ?Lab Results  ?Component Value Date  ? ETH <10 06/07/2021  ? ETH <10 11/22/2020  ? ? ?Metabolic Disorder Labs: ?Lab Results  ?Component Value Date  ? HGBA1C 5.1 06/07/2021  ? MPG 99.67 06/07/2021  ? MPG 111.15 11/24/2020  ? ?No results found for: PROLACTIN ?Lab Results  ?Component Value Date  ? CHOL 197 06/07/2021  ? TRIG 149 06/07/2021  ? HDL 44 06/07/2021  ? CHOLHDL 4.5 06/07/2021  ? VLDL 30 06/07/2021  ? LDLCALC 123 (H) 06/07/2021  ? LDLCALC 148 (H) 11/24/2020  ? ? ?Physical Findings: ?AIMS:  , ,  ,  ,    ?CIWA:    ?COWS:    ? ?Musculoskeletal: ?Strength & Muscle Tone: within normal limits ?Gait & Station: normal ?Patient leans: N/A ? ?Psychiatric Specialty Exam: ? ?Presentation  ?General Appearance: Appropriate for Environment; Bizarre; Disheveled; Fairly Groomed ? ?Eye Contact:Good ? ?Speech:Clear and Coherent; Normal Rate ? ?Speech Volume:Normal ? ?Handedness:Right ? ? ?Mood and Affect  ?Mood:Anxious; Depressed; Hopeless; Irritable ? ?Affect:Blunt; Depressed ? ? ?Thought Process  ?Thought Processes:Coherent; Linear ? ?Descriptions of Associations:Intact ? ?Orientation:Full (Time, Place and Person) ? ?Thought Content:Logical; WDL ? ?History of Schizophrenia/Schizoaffective disorder:No ? ?Duration of Psychotic Symptoms:N/A ? ?Hallucinations:No data recorded ?Ideas of Reference:None ? ?Suicidal Thoughts:No data recorded ?Homicidal Thoughts:No data recorded ? ?Sensorium  ?Memory:Immediate Fair; Recent Fair; Remote Fair ? ?Judgment:Fair ? ?Insight:Fair ? ? ?Executive Functions  ?Concentration:Fair ? ?Attention Span:Good ? ?Recall:Good ? ?[place] ? ?Language:Good ? ? ?Psychomotor Activity  ?Psychomotor Activity:No data recorded ? ?Assets  ?Assets:Communication Skills; Physical Health; Social Support ? ? ?Sleep  ?Sleep:No data recorded ? ? ?Physical Exam: ?Physical Exam ?Vitals and nursing note reviewed.  ?Constitutional:   ?    Appearance: Normal appearance.  ?HENT:  ?   Head: Normocephalic and atraumatic.  ?   Mouth/Throat:  ?   Pharynx: Oropharynx is clear.  ?Eyes:  ?   Pupils: Pupils are equal, round, and reactive to light.  ?Cardiovascul

## 2021-07-27 NOTE — Plan of Care (Signed)
Patient verbalized passive SI with no plans  as usual. Patient stayed in  bed till lunch time stated that he need to sleep. But after lunch patient took a shower and went outside to the courtyard with some prompting. Denies HI and AVH. Appetite and energy level good.Support and encouragement given. ?

## 2021-07-27 NOTE — Progress Notes (Signed)
Patient is active on the unit. Spending time in the dayroom watching tv and spending time with his peers.  He endorses depression and anxiety. He is med compliant and received his meds without incident. He denies si hi avh.  Will continue to monitor with q 15 minute safety. Encouraged him to seek staff with any concerns. ? ? ? ? ?C Butler-Nicholson, LPN ?

## 2021-07-27 NOTE — Progress Notes (Signed)
Recreation Therapy Notes ? ? ?Date: 07/27/2021 ?  ?Time: 10:50 am ?  ?Location: Craft room    ?  ?Behavioral response: N/A ? ?Intervention Topic: Self-esteem   ?  ?Discussion/Intervention:  ?Patient refused to attend group. ? ?Clinical Observations/Feedback: ?Patient refused to attend group. ? ?Augustus Zurawski LRT/CTRS  ? ? ? ? ? ? ? ?Kathie Posa ?07/27/2021 3:16 PM ?

## 2021-07-27 NOTE — Plan of Care (Signed)
?  Problem: Education: ?Goal: Utilization of techniques to improve thought processes will improve ?Outcome: Progressing ?Goal: Knowledge of the prescribed therapeutic regimen will improve ?Outcome: Progressing ?  ?Problem: Activity: ?Goal: Interest or engagement in leisure activities will improve ?Outcome: Progressing ?Goal: Imbalance in normal sleep/wake cycle will improve ?Outcome: Progressing ?  ?Problem: Coping: ?Goal: Coping ability will improve ?Description: PT IS PROGRESSING. ?Outcome: Progressing ?Goal: Will verbalize feelings ?Outcome: Progressing ?  ?Problem: Self-Concept: ?Goal: Will verbalize positive feelings about self ?Outcome: Progressing ?Goal: Level of anxiety will decrease ?Outcome: Progressing ?  ?Problem: Safety: ?Goal: Ability to disclose and discuss suicidal ideas will improve ?Outcome: Progressing ?Goal: Ability to identify and utilize support systems that promote safety will improve ?Outcome: Progressing ?  ?Problem: Role Relationship: ?Goal: Will demonstrate positive changes in social behaviors and relationships ?Outcome: Progressing ?  ?

## 2021-07-27 NOTE — Group Note (Signed)
[place] LCSW Group Therapy Note ? ? ?Group Date: 07/27/2021 ?Start Time: 1330 ?End Time: 1430 ? ? ?Type of Therapy/Topic:  Group Therapy:  Emotion Regulation ? ?Participation Level:  Did Not Attend  ? ? ? ?Description of Group:   ? The purpose of this group is to assist patients in learning to regulate negative emotions and experience positive emotions. Patients will be guided to discuss ways in which they have been vulnerable to their negative emotions. These vulnerabilities will be juxtaposed with experiences of positive emotions or situations, and patients challenged to use positive emotions to combat negative ones. Special emphasis will be placed on coping with negative emotions in conflict situations, and patients will process healthy conflict resolution skills. ? ?Therapeutic Goals: ?Patient will identify two positive emotions or experiences to reflect on in order to balance out negative emotions:  ?Patient will label two or more emotions that they find the most difficult to experience:  ?Patient will be able to demonstrate positive conflict resolution skills through discussion or role plays:  ? ?Summary of Patient Progress: ? ? ?x ? ? ? ?Therapeutic Modalities:   ?Cognitive Behavioral Therapy ?Feelings Identification ?Dialectical Behavioral Therapy ? ? ?Karalee Height, Student-Social Work ?

## 2021-07-28 NOTE — Progress Notes (Signed)
[facility] MD Progress Note ? ?07/28/2021 3:47 PM ?Casey Silva  ?MRN:  [id number] ?Subjective: Follow-up 66 year old man with depression.  Patient continues to spend a lot of time in bed.  When I went to see him today he told me he was feeling "pretty good".  Could not really go much farther than that.  Told me he took a shower yesterday.  No complaints about any side effects of medicine.  Vitals stable.  No high blood pressure. ?Principal Problem: Severe recurrent major depression with psychotic features ([place]) ?Diagnosis: Principal Problem: ?  Severe recurrent major depression with psychotic features ([place]) ?Active Problems: ?  Primary insomnia ?  GAD (generalized anxiety disorder) ?  Essential hypertension ? ?Total Time spent with patient: 30 minutes ? ?Past Psychiatric History: History of depression ? ?Past Medical History:  ?Past Medical History:  ?Diagnosis Date  ? Anxiety   ? Asthma   ? Depression   ? History reviewed. No pertinent surgical history. ?Family History:  ?Family History  ?Problem Relation Age of Onset  ? Bipolar disorder Mother   ? ?Family Psychiatric  History: See previous ?Social History:  ?Social History  ? ?Substance and Sexual Activity  ?Alcohol Use No  ?   ?Social History  ? ?Substance and Sexual Activity  ?Drug Use No  ?  ?Social History  ? ?Socioeconomic History  ? Marital status: Single  ?  Spouse name: Not on file  ? Number of children: 1  ? Years of education: Not on file  ? Highest education level: GED or equivalent  ?Occupational History  ? Not on file  ?Tobacco Use  ? Smoking status: Never  ? Smokeless tobacco: Never  ?Vaping Use  ? Vaping Use: Unknown  ?Substance and Sexual Activity  ? Alcohol use: No  ? Drug use: No  ? Sexual activity: Not Currently  ?Other Topics Concern  ? Not on file  ?Social History Narrative  ? Marital status:  Single   Children: Daughter   Employment:  Unemployment. previous work for [employer].   Alcohol:  None   Drugs:  none   Exercise:  Regularly very    Religion:  Jewish  ? ?Social Determinants of Health  ? ?Financial Resource Strain: Not on file  ?Food Insecurity: Not on file  ?Transportation Needs: Not on file  ?Physical Activity: Not on file  ?Stress: Not on file  ?Social Connections: Not on file  ? ?Additional Social History:  ?  ?  ?  ?  ?  ?  ?  ?  ?  ?  ?  ? ?Sleep: Fair ? ?Appetite:  Fair ? ?Current Medications: ?Current Facility-Administered Medications  ?Medication Dose Route Frequency Provider Last Rate Last Admin  ? acetaminophen (TYLENOL) tablet 650 mg  650 mg Oral Once PRN Darrin Nipper, MD      ? Or  ? acetaminophen (TYLENOL) 160 MG/5ML solution 325-650 mg  325-650 mg Oral Q4H PRN Darrin Nipper, MD      ? amLODipine (NORVASC) tablet 10 mg  10 mg Oral Daily Johnica Armwood, [name], MD   10 mg at 07/28/21 8891  ? diclofenac Sodium (VOLTAREN) 1 % topical gel 2 g  2 g Topical Q12H PRN Annisten Manchester T, MD      ? famotidine (PEPCID) tablet 20 mg  20 mg Oral BID Keane Martelli, [name], MD   20 mg at 07/28/21 0827  ? feeding supplement (ENSURE ENLIVE / ENSURE PLUS) liquid 237 mL  237 mL Oral BID BM  Antone Summons, [name], MD   237 mL at 07/28/21 1100  ? hydrOXYzine (ATARAX) tablet 50 mg  50 mg Oral Q6H PRN Velicia Dejager, [name], MD   50 mg at 07/18/21 2103  ? ibuprofen (ADVIL) tablet 600 mg  600 mg Oral Q6H PRN Nanea Jared, [name], MD   600 mg at 06/27/21 0915  ? lithium carbonate (LITHOBID) CR tablet 300 mg  300 mg Oral Q12H Su Duma, [name], MD   300 mg at 07/28/21 0827  ? loperamide (IMODIUM) capsule 2 mg  2 mg Oral PRN Larita Fife, MD   2 mg at 07/22/21 0858  ? melatonin tablet 5 mg  5 mg Oral QHS Oaklan Persons T, MD   5 mg at 07/27/21 2127  ? mirtazapine (REMERON) tablet 45 mg  45 mg Oral QHS Mikiah Demond, [name], MD   45 mg at 07/27/21 2127  ? multivitamin with minerals tablet 1 tablet  1 tablet Oral Daily Jakera Beaupre, [name], MD   1 tablet at 07/28/21 6599  ? ondansetron (ZOFRAN-ODT) disintegrating tablet 4 mg  4 mg Oral Q4H PRN Areil Ottey T, MD      ? QUEtiapine (SEROQUEL) tablet 300  mg  300 mg Oral QHS Traci Plemons, [name], MD   300 mg at 07/27/21 2127  ? traZODone (DESYREL) tablet 100 mg  100 mg Oral QHS PRN Demeka Sutter, [name], MD   100 mg at 07/27/21 2127  ? venlafaxine XR (EFFEXOR-XR) 24 hr capsule 225 mg  225 mg Oral Q breakfast Escarlet Saathoff, [name], MD   225 mg at 07/28/21 3570  ? Vitamin D (Ergocalciferol) (DRISDOL) capsule 50,000 Units  50,000 Units Oral Q7 days Shenita Trego, [name], MD   50,000 Units at 07/23/21 1619  ? ? ?Lab Results: No results found for this or any previous visit (from the past 48 hour(s)). ? ?Blood Alcohol level:  ?Lab Results  ?Component Value Date  ? ETH <10 06/07/2021  ? ETH <10 11/22/2020  ? ? ?Metabolic Disorder Labs: ?Lab Results  ?Component Value Date  ? HGBA1C 5.1 06/07/2021  ? MPG 99.67 06/07/2021  ? MPG 111.15 11/24/2020  ? ?No results found for: PROLACTIN ?Lab Results  ?Component Value Date  ? CHOL 197 06/07/2021  ? TRIG 149 06/07/2021  ? HDL 44 06/07/2021  ? CHOLHDL 4.5 06/07/2021  ? VLDL 30 06/07/2021  ? LDLCALC 123 (H) 06/07/2021  ? LDLCALC 148 (H) 11/24/2020  ? ? ?Physical Findings: ?AIMS:  , ,  ,  ,    ?CIWA:    ?COWS:    ? ?Musculoskeletal: ?Strength & Muscle Tone: within normal limits ?Gait & Station: normal ?Patient leans: N/A ? ?Psychiatric Specialty Exam: ? ?Presentation  ?General Appearance: Appropriate for Environment; Bizarre; Disheveled; Fairly Groomed ? ?Eye Contact:Good ? ?Speech:Clear and Coherent; Normal Rate ? ?Speech Volume:Normal ? ?Handedness:Right ? ? ?Mood and Affect  ?Mood:Anxious; Depressed; Hopeless; Irritable ? ?Affect:Blunt; Depressed ? ? ?Thought Process  ?Thought Processes:Coherent; Linear ? ?Descriptions of Associations:Intact ? ?Orientation:Full (Time, Place and Person) ? ?Thought Content:Logical; WDL ? ?History of Schizophrenia/Schizoaffective disorder:No ? ?Duration of Psychotic Symptoms:N/A ? ?Hallucinations:No data recorded ?Ideas of Reference:None ? ?Suicidal Thoughts:No data recorded ?Homicidal Thoughts:No data recorded ? ?Sensorium   ?Memory:Immediate Fair; Recent Fair; Remote Fair ? ?Judgment:Fair ? ?Insight:Fair ? ? ?Executive Functions  ?Concentration:Fair ? ?Attention Span:Good ? ?Recall:Good ? ?[place] ? ?Language:Good ? ? ?Psychomotor Activity  ?Psychomotor Activity:No data recorded ? ?Assets  ?Assets:Communication Skills; Physical Health; Social Support ? ? ?Sleep  ?Sleep:No  data recorded ? ? ?Physical Exam: ?Physical Exam ?Vitals and nursing note reviewed.  ?Constitutional:   ?   Appearance: Normal appearance.  ?HENT:  ?   Head: Normocephalic and atraumatic.  ?   Mouth/Throat:  ?   Pharynx: Oropharynx is clear.  ?Eyes:  ?   Pupils: Pupils are equal, round, and reactive to light.  ?Cardiovascular:  ?   Rate and Rhythm: Normal rate and regular rhythm.  ?Pulmonary:  ?   Effort: Pulmonary effort is normal.  ?   Breath sounds: Normal breath sounds.  ?Abdominal:  ?   General: Abdomen is flat.  ?   Palpations: Abdomen is soft.  ?Musculoskeletal:     ?   General: Normal range of motion.  ?Skin: ?   General: Skin is warm and dry.  ?Neurological:  ?   General: No focal deficit present.  ?   Mental Status: He is alert. Mental status is at baseline.  ?Psychiatric:     ?   Attention and Perception: Attention normal.     ?   Mood and Affect: Mood normal. Affect is blunt.     ?   Speech: Speech is delayed.     ?   Behavior: Behavior is slowed.     ?   Thought Content: Thought content normal.  ? ?Review of Systems  ?Constitutional: Negative.   ?HENT: Negative.    ?Eyes: Negative.   ?Respiratory: Negative.    ?Cardiovascular: Negative.   ?Gastrointestinal: Negative.   ?Musculoskeletal: Negative.   ?Skin: Negative.   ?Neurological: Negative.   ?Psychiatric/Behavioral:  Positive for depression and suicidal ideas. Negative for hallucinations.   ?Blood pressure 123/82, pulse 79, temperature 98.4 ?F (36.9 ?C), temperature source Oral, resp. rate 18, height '5\' 6"'$  (1.676 m), weight 76.2 kg, SpO2 96 %. Body mass index is 27.12  kg/m?. ? ? ?Treatment Plan Summary: ?Medication management and Plan we started lithium for him.  Low dose.  Hoping to see if there is some improvement with that.  As usual I encouraged patient to be out of bed and be more in

## 2021-07-28 NOTE — Plan of Care (Signed)
?  Problem: Education: ?Goal: Utilization of techniques to improve thought processes will improve ?07/28/2021 0448 by Butler-Nicholson, Rica Koyanagi, LPN ?Outcome: Progressing ?07/28/2021 0122 by Butler-Nicholson, Rica Koyanagi, LPN ?Outcome: Progressing ?Goal: Knowledge of the prescribed therapeutic regimen will improve ?07/28/2021 0448 by Butler-Nicholson, Rica Koyanagi, LPN ?Outcome: Progressing ?07/28/2021 0122 by Butler-Nicholson, Rica Koyanagi, LPN ?Outcome: Progressing ?  ?Problem: Activity: ?Goal: Interest or engagement in leisure activities will improve ?07/28/2021 0448 by Butler-Nicholson, Rica Koyanagi, LPN ?Outcome: Progressing ?07/28/2021 0122 by Butler-Nicholson, Rica Koyanagi, LPN ?Outcome: Progressing ?Goal: Imbalance in normal sleep/wake cycle will improve ?07/28/2021 0448 by Butler-Nicholson, Rica Koyanagi, LPN ?Outcome: Progressing ?07/28/2021 0122 by Butler-Nicholson, Rica Koyanagi, LPN ?Outcome: Progressing ?  ?Problem: Health Behavior/Discharge Planning: ?Goal: Ability to make decisions will improve ?07/28/2021 0448 by Butler-Nicholson, Rica Koyanagi, LPN ?Outcome: Progressing ?07/28/2021 0122 by Butler-Nicholson, Rica Koyanagi, LPN ?Outcome: Progressing ?Goal: Compliance with therapeutic regimen will improve ?07/28/2021 0448 by Butler-Nicholson, Rica Koyanagi, LPN ?Outcome: Progressing ?07/28/2021 0122 by Butler-Nicholson, Rica Koyanagi, LPN ?Outcome: Progressing ?  ?Problem: Role Relationship: ?Goal: Will demonstrate positive changes in social behaviors and relationships ?07/28/2021 0448 by Butler-Nicholson, Rica Koyanagi, LPN ?Outcome: Progressing ?07/28/2021 0122 by Butler-Nicholson, Rica Koyanagi, LPN ?Outcome: Progressing ?  ?Problem: Safety: ?Goal: Ability to disclose and discuss suicidal ideas will improve ?07/28/2021 0448 by Butler-Nicholson, Rica Koyanagi, LPN ?Outcome: Progressing ?07/28/2021 0122 by Butler-Nicholson, Rica Koyanagi, LPN ?Outcome: Progressing ?Goal: Ability to identify and utilize support systems that promote  safety will improve ?07/28/2021 0448 by Butler-Nicholson, Rica Koyanagi, LPN ?Outcome: Progressing ?07/28/2021 0122 by Butler-Nicholson, Rica Koyanagi, LPN ?Outcome: Progressing ?  ?

## 2021-07-28 NOTE — Plan of Care (Signed)
?  Problem: Education: ?Goal: Utilization of techniques to improve thought processes will improve ?Outcome: Progressing ?Goal: Knowledge of the prescribed therapeutic regimen will improve ?Outcome: Progressing ?  ?Problem: Activity: ?Goal: Interest or engagement in leisure activities will improve ?Outcome: Progressing ?Goal: Imbalance in normal sleep/wake cycle will improve ?Outcome: Progressing ?  ?Problem: Coping: ?Goal: Coping ability will improve ?Description: PT IS PROGRESSING. ?Outcome: Progressing ?Goal: Will verbalize feelings ?Outcome: Progressing ?  ?Problem: Health Behavior/Discharge Planning: ?Goal: Ability to make decisions will improve ?Outcome: Progressing ?Goal: Compliance with therapeutic regimen will improve ?Outcome: Progressing ?  ?Problem: Self-Concept: ?Goal: Will verbalize positive feelings about self ?Outcome: Progressing ?Goal: Level of anxiety will decrease ?Outcome: Progressing ?  ?Problem: Safety: ?Goal: Ability to disclose and discuss suicidal ideas will improve ?Outcome: Progressing ?Goal: Ability to identify and utilize support systems that promote safety will improve ?Outcome: Progressing ?  ?

## 2021-07-28 NOTE — Progress Notes (Signed)
Patient is pleasant and cooperative. He is active on the unit. He has been watching the [employer] and is in [place] pleasant mood. He denies si  hi avh  anxiety and pain.  He continues to endorse  depression but does not appear to be depressed. Will continue to monitor with q15 minute safety checks. ? ? ?C Butler-Nicholson, LPN ?

## 2021-07-28 NOTE — Progress Notes (Signed)
Recreation Therapy Notes ? ?Date: 07/28/2021 ? ?Time: 10:30 am    ? ?Location: Courtyard   ? ?Behavioral response: Appropriate ? ?Intervention Topic: Wellness    ? ?Discussion/Intervention:  ?Group content today was focused on Wellness. The group defined wellness and some positive ways they make decisions for themselves. Individuals expressed reasons why they neglected any wellness in the past. Patients described ways to improve wellness skills in the future. The group explained what could happen if they did not do any wellness at all. Participants express how bad choices has affected them and others around them. Individual explained the importance of wellness. The group participated in the intervention ?Testing my Wellness? where they had a chance to identify some of their weaknesses and strengths in wellness.  ?Clinical Observations/Feedback: ?[name] and was focused on what peers and staff had to say about wellness. Individual was social with peers and staff while participating in the intervention.    ?[name] LRT/CTRS  ? ? ? ? ? ? ? ?Casey Silva ?07/28/2021 12:59 PM ?

## 2021-07-28 NOTE — Group Note (Signed)
[place] LCSW Group Therapy Note ? ? ?Group Date: 07/28/2021 ?Start Time: 1300 ?End Time: 1400 ? ? ?Type of Therapy/Topic:  Group Therapy:  Balance in Life ? ?Participation Level:  Did Not Attend  ? ?Description of Group:   ? This group will address the concept of balance and how it feels and looks when one is unbalanced. Patients will be encouraged to process areas in their lives that are out of balance, and identify reasons for remaining unbalanced. Facilitators will guide patients utilizing problem- solving interventions to address and correct the stressor making their life unbalanced. Understanding and applying boundaries will be explored and addressed for obtaining  and maintaining a balanced life. Patients will be encouraged to explore ways to assertively make their unbalanced needs known to significant others in their lives, using other group members and facilitator for support and feedback. ? ?Therapeutic Goals: ?Patient will identify two or more emotions or situations they have that consume much of in their lives. ?Patient will identify signs/triggers that life has become out of balance:  ?Patient will identify two ways to set boundaries in order to achieve balance in their lives:  ?Patient will demonstrate ability to communicate their needs through discussion and/or role plays ? ?Summary of Patient Progress: ? ? ? ?Patient did not attend group despite encouraged participation.  ? ? ? ?Therapeutic Modalities:   ?Cognitive Behavioral Therapy ?Solution-Focused Therapy ?Assertiveness Training ? ? ?Durenda Hurt, LCSWA ?

## 2021-07-28 NOTE — Plan of Care (Signed)
D- Patient alert and oriented. Patient presents in a pleasant mood on assessment reporting that he slept "poor" last night because of staff coming in and out of his room. Patient continues to endorse passive SI, hopelessness, depression, and anxiety on his self-inventory. Patient stated that this is just how he is. Patient states that he feels safe here in the hospital. Patient denies HI/AVH, and pain at this time. Patient's goal for today is to "rest", in which he will "concentrate" in order to achieve his goal. ? ?A- Scheduled medications administered to patient, per MD orders. Support and encouragement provided.  Routine safety checks conducted every 15 minutes.  Patient informed to notify staff with problems or concerns. ? ?R- No adverse drug reactions noted. Patient contracts for safety at this time. Patient compliant with medications and treatment plan. Patient receptive, calm, and cooperative. Patient interacts well with others on the unit.  Patient remains safe at this time. ? ?Problem: Education: ?Goal: Utilization of techniques to improve thought processes will improve ?Outcome: Progressing ?Goal: Knowledge of the prescribed therapeutic regimen will improve ?Outcome: Progressing ?  ?Problem: Activity: ?Goal: Interest or engagement in leisure activities will improve ?Outcome: Progressing ?Goal: Imbalance in normal sleep/wake cycle will improve ?Outcome: Progressing ?  ?Problem: Coping: ?Goal: Coping ability will improve ?Description: PT IS PROGRESSING. ?Outcome: Progressing ?Goal: Will verbalize feelings ?Outcome: Progressing ?  ?Problem: Health Behavior/Discharge Planning: ?Goal: Ability to make decisions will improve ?Outcome: Progressing ?Goal: Compliance with therapeutic regimen will improve ?Outcome: Progressing ?  ?Problem: Role Relationship: ?Goal: Will demonstrate positive changes in social behaviors and relationships ?Outcome: Progressing ?  ?Problem: Safety: ?Goal: Ability to disclose and discuss  suicidal ideas will improve ?Outcome: Progressing ?Goal: Ability to identify and utilize support systems that promote safety will improve ?Outcome: Progressing ?  ?Problem: Self-Concept: ?Goal: Will verbalize positive feelings about self ?Outcome: Progressing ?Goal: Level of anxiety will decrease ?Outcome: Progressing ?  ?

## 2021-07-29 NOTE — Progress Notes (Signed)
Patient in dayroom most of early shift. Reports today was not a good day. Feels he was better on yesterday. Reports his depression has increased. Denies SI, HI, but just does not feel good today. Patient Is medication compliant. In dayroom watching games with peers. Minimal interaction. Encouragement and support provided. Safety checks maintained. Medications given as prescribed. Pt receptive and remains safe on unit with q 15 min checks. ?

## 2021-07-29 NOTE — Progress Notes (Signed)
Patient awake for breakfast. Took morning medications. Patient rates both anxiety and depression a 7. Patient attending groups. Patient denies SI/HI/AVH. Denies pain. Patient is calm and cooperative with staff.  ?

## 2021-07-29 NOTE — Plan of Care (Signed)
?  Problem: Education: ?Goal: Utilization of techniques to improve thought processes will improve ?Outcome: Progressing ?Goal: Knowledge of the prescribed therapeutic regimen will improve ?Outcome: Progressing ?  ?Problem: Activity: ?Goal: Interest or engagement in leisure activities will improve ?Outcome: Progressing ?  ?Problem: Coping: ?Goal: Coping ability will improve ?Description: PT IS PROGRESSING. ?Outcome: Progressing ?Goal: Will verbalize feelings ?Outcome: Progressing ?  ?

## 2021-07-29 NOTE — Progress Notes (Signed)
Recreation Therapy Notes ? ? ?Date: 07/29/2021 ? ?Time: 10:45 am   ? ?Location: Courtyard    ? ?Behavioral response: N/A ?  ?Intervention Topic: Social skills  ? ?Discussion/Intervention: ?Patient refused to attend group.  ? ?Clinical Observations/Feedback:  ?Patient refused to attend group.  ?  ?Candus Braud LRT/CTRS ? ? ? ? ? ? ? ?Chaka Boyson ?07/29/2021 12:59 PM ?

## 2021-07-29 NOTE — Group Note (Signed)
[place] LCSW Group Therapy Note ? ? ?Group Date: 07/29/2021 ?Start Time: 1300 ?End Time: 1400 ? ?Type of Therapy and Topic:  Group Therapy:  Feelings around Relapse and Recovery ? ?Participation Level:  Active  ? ?Mood: ? ?Description of Group:   ? Patients in this group will discuss emotions they experience before and after a relapse. They will process how experiencing these feelings, or avoidance of experiencing them, relates to having a relapse. Facilitator will guide patients to explore emotions they have related to recovery. Patients will be encouraged to process which emotions are more powerful. They will be guided to discuss the emotional reaction significant others in their lives may have to patients? relapse or recovery. Patients will be assisted in exploring ways to respond to the emotions of others without this contributing to a relapse. ? ?Therapeutic Goals: ?Patient will identify two or more emotions that lead to relapse for them:  ?Patient will identify two emotions that result when they relapse:  ?Patient will identify two emotions related to recovery:  ?Patient will demonstrate ability to communicate their needs through discussion and/or role plays. ? ? ?Summary of Patient Progress: ? ?Patient was sole participant in group, group was not held. CSW offered to discuss discharge planning as alternative.  ? ? ?Therapeutic Modalities:   ?Cognitive Behavioral Therapy ?Solution-Focused Therapy ?Assertiveness Training ?Relapse Prevention Therapy ? ? ?Durenda Hurt, LCSWA ?

## 2021-07-29 NOTE — Progress Notes (Signed)
?   07/29/21 0900  ?Psych Admission Type (Psych Patients Only)  ?Admission Status Voluntary  ?Psychosocial Assessment  ?Patient Complaints Depression;Anxiety  ?Eye Contact Brief  ?Facial Expression Blank  ?Affect Appropriate to circumstance  ?Speech Logical/coherent  ?Interaction Minimal  ?Motor Activity Slow  ?Appearance/Hygiene Unremarkable;In scrubs  ?Behavior Characteristics Cooperative;Appropriate to situation  ?Mood Depressed  ?Thought Process  ?Coherency WDL  ?Content WDL  ?Delusions None reported or observed  ?Perception WDL  ?Hallucination None reported or observed  ?Judgment WDL  ?Confusion None  ?Danger to Self  ?Current suicidal ideation? Denies  ?Self-Injurious Behavior No self-injurious ideation or behavior indicators observed or expressed   ?Agreement Not to Harm Self Yes  ?Description of Agreement Verbal  ?Danger to Others  ?Danger to Others None reported or observed  ? ? ?

## 2021-07-29 NOTE — BH IP Treatment Plan (Signed)
Interdisciplinary Treatment and Diagnostic Plan Update ? ?07/29/2021 ?Time of Session: 8:30AM ?Casey Silva ?MRN: [id number] ? ?Principal Diagnosis: Severe recurrent major depression with psychotic features ([place]) ? ?Secondary Diagnoses: Principal Problem: ?  Severe recurrent major depression with psychotic features ([place]) ?Active Problems: ?  Primary insomnia ?  GAD (generalized anxiety disorder) ?  Essential hypertension ? ? ?Current Medications:  ?Current Facility-Administered Medications  ?Medication Dose Route Frequency Provider Last Rate Last Admin  ? acetaminophen (TYLENOL) tablet 650 mg  650 mg Oral Once PRN Darrin Nipper, MD      ? Or  ? acetaminophen (TYLENOL) 160 MG/5ML solution 325-650 mg  325-650 mg Oral Q4H PRN Darrin Nipper, MD      ? amLODipine (NORVASC) tablet 10 mg  10 mg Oral Daily Clapacs, [name], MD   10 mg at 07/29/21 0820  ? diclofenac Sodium (VOLTAREN) 1 % topical gel 2 g  2 g Topical Q12H PRN Clapacs, [name], MD      ? famotidine (PEPCID) tablet 20 mg  20 mg Oral BID Clapacs, [name], MD   20 mg at 07/29/21 0820  ? feeding supplement (ENSURE ENLIVE / ENSURE PLUS) liquid 237 mL  237 mL Oral BID BM Clapacs, [name], MD   237 mL at 07/29/21 1000  ? hydrOXYzine (ATARAX) tablet 50 mg  50 mg Oral Q6H PRN Clapacs, [name], MD   50 mg at 07/18/21 2103  ? ibuprofen (ADVIL) tablet 600 mg  600 mg Oral Q6H PRN Clapacs, [name], MD   600 mg at 06/27/21 0915  ? lithium carbonate (LITHOBID) CR tablet 300 mg  300 mg Oral Q12H Clapacs, [name], MD   300 mg at 07/29/21 0820  ? loperamide (IMODIUM) capsule 2 mg  2 mg Oral PRN Larita Fife, MD   2 mg at 07/22/21 0858  ? melatonin tablet 5 mg  5 mg Oral QHS Clapacs, [name], MD   5 mg at 07/28/21 2111  ? mirtazapine (REMERON) tablet 45 mg  45 mg Oral QHS Clapacs, [name], MD   45 mg at 07/28/21 2111  ? multivitamin with minerals tablet 1 tablet  1 tablet Oral Daily Clapacs, [name], MD   1 tablet at 07/29/21 0820  ? ondansetron (ZOFRAN-ODT) disintegrating tablet 4 mg  4  mg Oral Q4H PRN Clapacs, [name], MD      ? QUEtiapine (SEROQUEL) tablet 300 mg  300 mg Oral QHS Clapacs, [name], MD   300 mg at 07/28/21 2111  ? traZODone (DESYREL) tablet 100 mg  100 mg Oral QHS PRN Clapacs, [name], MD   100 mg at 07/28/21 2111  ? venlafaxine XR (EFFEXOR-XR) 24 hr capsule 225 mg  225 mg Oral Q breakfast Clapacs, [name], MD   225 mg at 07/29/21 0820  ? Vitamin D (Ergocalciferol) (DRISDOL) capsule 50,000 Units  50,000 Units Oral Q7 days Clapacs, [name], MD   50,000 Units at 07/23/21 1619  ? ?PTA Medications: ?Medications Prior to Admission  ?Medication Sig Dispense Refill Last Dose  ? amLODipine (NORVASC) 10 MG tablet Take 1 tablet (10 mg total) by mouth daily. 30 tablet 0 07/03/2021  ? gabapentin (NEURONTIN) 400 MG capsule Take 1 capsule (400 mg total) by mouth 3 (three) times daily. 90 capsule 0 07/03/2021  ? LORazepam (ATIVAN) 0.5 MG tablet Take 1 tablet (0.5 mg total) by mouth 2 (two) times daily as needed for anxiety. 30 tablet 0 07/03/2021  ? melatonin 5 MG TABS Take  1 tablet (5 mg total) by mouth at bedtime. 30 tablet 0 07/03/2021  ? mirtazapine (REMERON) 30 MG tablet Take 1 tablet (30 mg total) by mouth at bedtime. 30 tablet 3 07/03/2021  ? propranolol (INDERAL) 10 MG tablet Take 1 tablet (10 mg total) by mouth 3 (three) times daily. 90 tablet 0 07/03/2021  ? QUEtiapine (SEROQUEL) 100 MG tablet Take 1 tablet (100 mg total) by mouth at bedtime. 30 tablet 0 07/03/2021  ? QUEtiapine (SEROQUEL) 25 MG tablet Take 1 tablet (25 mg total) by mouth 2 (two) times daily. 60 tablet 0 07/03/2021  ? traZODone (DESYREL) 100 MG tablet Take 1 tablet (100 mg total) by mouth at bedtime. 30 tablet 0 07/03/2021  ? venlafaxine XR (EFFEXOR-XR) 37.5 MG 24 hr capsule Take 3 capsules (112.5 mg total) by mouth daily with breakfast. 30 capsule 0 07/03/2021  ? [EXPIRED] Vitamin D, Ergocalciferol, (DRISDOL) 1.25 MG (50000 UNIT) CAPS capsule Take 1 capsule (50,000 Units total) by mouth every 7 (seven) days. 5 capsule 0 07/03/2021   ? ? ?Patient Stressors: Loss of connection with work, loss of sense of purpose   ?Other: loss of mental health   ? ?Patient Strengths: Ability for insight  ?Communication skills  ?General fund of knowledge  ?Motivation for treatment/growth  ? ?Treatment Modalities: Medication Management, Group therapy, Case management,  ?1 to 1 session with clinician, Psychoeducation, Recreational therapy. ? ? ?Physician Treatment Plan for Primary Diagnosis: Severe recurrent major depression with psychotic features ([place]) ?Long Term Goal(s): Improvement in symptoms so as ready for discharge  ? ?Short Term Goals: Ability to maintain clinical measurements within normal limits will improve ?Compliance with prescribed medications will improve ?Ability to verbalize feelings will improve ?Ability to disclose and discuss suicidal ideas ?Ability to demonstrate self-control will improve ? ?Medication Management: Evaluate patient's response, side effects, and tolerance of medication regimen. ? ?Therapeutic Interventions: 1 to 1 sessions, Unit Group sessions and Medication administration. ? ?Evaluation of Outcomes: Progressing ? ?Physician Treatment Plan for Secondary Diagnosis: Principal Problem: ?  Severe recurrent major depression with psychotic features ([place]) ?Active Problems: ?  Primary insomnia ?  GAD (generalized anxiety disorder) ?  Essential hypertension ? ?Long Term Goal(s): Improvement in symptoms so as ready for discharge  ? ?Short Term Goals: Ability to maintain clinical measurements within normal limits will improve ?Compliance with prescribed medications will improve ?Ability to verbalize feelings will improve ?Ability to disclose and discuss suicidal ideas ?Ability to demonstrate self-control will improve    ? ?Medication Management: Evaluate patient's response, side effects, and tolerance of medication regimen. ? ?Therapeutic Interventions: 1 to 1 sessions, Unit Group sessions and Medication administration. ? ?Evaluation of  Outcomes: Progressing ? ? ?RN Treatment Plan for Primary Diagnosis: Severe recurrent major depression with psychotic features ([place]) ?Long Term Goal(s): Knowledge of disease and therapeutic regimen to maintain health will improve ? ?Short Term Goals: Ability to demonstrate self-control, Ability to participate in decision making will improve, Ability to verbalize feelings will improve, Ability to disclose and discuss suicidal ideas, Ability to identify and develop effective coping behaviors will improve, and Compliance with prescribed medications will improve ? ?Medication Management: RN will administer medications as ordered by provider, will assess and evaluate patient's response and provide education to patient for prescribed medication. RN will report any adverse and/or side effects to prescribing provider. ? ?Therapeutic Interventions: 1 on 1 counseling sessions, Psychoeducation, Medication administration, Evaluate responses to treatment, Monitor vital signs and CBGs as ordered, Perform/monitor CIWA, COWS, AIMS and Fall Risk  screenings as ordered, Perform wound care treatments as ordered. ? ?Evaluation of Outcomes: Progressing ? ? ?LCSW Treatment Plan for Primary Diagnosis: Severe recurrent major depression with psychotic features ([place]) ?Long Term Goal(s): Safe transition to appropriate next level of care at discharge, Engage patient in therapeutic group addressing interpersonal concerns. ? ?Short Term Goals: Engage patient in aftercare planning with referrals and resources, Increase social support, Increase ability to appropriately verbalize feelings, Increase emotional regulation, Facilitate acceptance of mental health diagnosis and concerns, and Increase skills for wellness and recovery ? ?Therapeutic Interventions: Assess for all discharge needs, 1 to 1 time with Education officer, museum, Explore available resources and support systems, Assess for adequacy in community support network, Educate family and significant  other(s) on suicide prevention, Complete Psychosocial Assessment, Interpersonal group therapy. ? ?Evaluation of Outcomes: Progressing ? ? ?Progress in Treatment: ?Attending groups: No. ?Participating in group

## 2021-07-29 NOTE — Plan of Care (Signed)
?  Problem: Education: ?Goal: Utilization of techniques to improve thought processes will improve ?Outcome: Progressing ?Goal: Knowledge of the prescribed therapeutic regimen will improve ?Outcome: Progressing ?  ?Problem: Activity: ?Goal: Interest or engagement in leisure activities will improve ?Outcome: Progressing ?Goal: Imbalance in normal sleep/wake cycle will improve ?Outcome: Progressing ?  ?Problem: Coping: ?Goal: Coping ability will improve ?Description: PT IS PROGRESSING. ?Outcome: Progressing ?Goal: Will verbalize feelings ?Outcome: Progressing ?  ?Problem: Health Behavior/Discharge Planning: ?Goal: Ability to make decisions will improve ?Outcome: Progressing ?Goal: Compliance with therapeutic regimen will improve ?Outcome: Progressing ?  ?Problem: Role Relationship: ?Goal: Will demonstrate positive changes in social behaviors and relationships ?Outcome: Progressing ?  ?Problem: Safety: ?Goal: Ability to disclose and discuss suicidal ideas will improve ?Outcome: Progressing ?Goal: Ability to identify and utilize support systems that promote safety will improve ?Outcome: Progressing ?  ?Problem: Self-Concept: ?Goal: Will verbalize positive feelings about self ?Outcome: Progressing ?Goal: Level of anxiety will decrease ?Outcome: Progressing ?  ?

## 2021-07-29 NOTE — Progress Notes (Signed)
[facility] MD Progress Note ? ?07/29/2021 3:42 PM ?Casey Silva  ?MRN:  [id number] ?Subjective: Follow-up for this 66 year old man with recurrent depression.  Continues to endorse depressed mood and chronic fatigue.  Passive suicidal ideation without specific intent.  Hopelessness. ?Principal Problem: Severe recurrent major depression with psychotic features ([place]) ?Diagnosis: Principal Problem: ?  Severe recurrent major depression with psychotic features ([place]) ?Active Problems: ?  Primary insomnia ?  GAD (generalized anxiety disorder) ?  Essential hypertension ? ?Total Time spent with patient: 30 minutes ? ?Past Psychiatric History: Past history of recurrent severe depression partial response to ECT ? ?Past Medical History:  ?Past Medical History:  ?Diagnosis Date  ? Anxiety   ? Asthma   ? Depression   ? History reviewed. No pertinent surgical history. ?Family History:  ?Family History  ?Problem Relation Age of Onset  ? Bipolar disorder Mother   ? ?Family Psychiatric  History: See previous ?Social History:  ?Social History  ? ?Substance and Sexual Activity  ?Alcohol Use No  ?   ?Social History  ? ?Substance and Sexual Activity  ?Drug Use No  ?  ?Social History  ? ?Socioeconomic History  ? Marital status: Single  ?  Spouse name: Not on file  ? Number of children: 1  ? Years of education: Not on file  ? Highest education level: GED or equivalent  ?Occupational History  ? Not on file  ?Tobacco Use  ? Smoking status: Never  ? Smokeless tobacco: Never  ?Vaping Use  ? Vaping Use: Unknown  ?Substance and Sexual Activity  ? Alcohol use: No  ? Drug use: No  ? Sexual activity: Not Currently  ?Other Topics Concern  ? Not on file  ?Social History Narrative  ? Marital status:  Single   Children: Daughter   Employment:  Unemployment. previous work for [employer].   Alcohol:  None   Drugs:  none   Exercise:  Regularly very   Religion:  Jewish  ? ?Social Determinants of Health  ? ?Financial Resource Strain: Not on file  ?Food  Insecurity: Not on file  ?Transportation Needs: Not on file  ?Physical Activity: Not on file  ?Stress: Not on file  ?Social Connections: Not on file  ? ?Additional Social History:  ?  ?  ?  ?  ?  ?  ?  ?  ?  ?  ?  ? ?Sleep: Fair ? ?Appetite:  Fair ? ?Current Medications: ?Current Facility-Administered Medications  ?Medication Dose Route Frequency Provider Last Rate Last Admin  ? acetaminophen (TYLENOL) tablet 650 mg  650 mg Oral Once PRN Darrin Nipper, MD      ? Or  ? acetaminophen (TYLENOL) 160 MG/5ML solution 325-650 mg  325-650 mg Oral Q4H PRN Darrin Nipper, MD      ? amLODipine (NORVASC) tablet 10 mg  10 mg Oral Daily Kazuma Elena, [name], MD   10 mg at 07/29/21 0820  ? diclofenac Sodium (VOLTAREN) 1 % topical gel 2 g  2 g Topical Q12H PRN Carolynne Schuchard T, MD      ? famotidine (PEPCID) tablet 20 mg  20 mg Oral BID Deshone Lyssy, [name], MD   20 mg at 07/29/21 0820  ? feeding supplement (ENSURE ENLIVE / ENSURE PLUS) liquid 237 mL  237 mL Oral BID BM Darcell Sabino T, MD   237 mL at 07/29/21 1411  ? hydrOXYzine (ATARAX) tablet 50 mg  50 mg Oral Q6H PRN Ronan Dion, [name], MD   50 mg at  07/18/21 2103  ? ibuprofen (ADVIL) tablet 600 mg  600 mg Oral Q6H PRN Ying Blankenhorn, [name], MD   600 mg at 06/27/21 0915  ? lithium carbonate (LITHOBID) CR tablet 300 mg  300 mg Oral Q12H Ivie Maese, [name], MD   300 mg at 07/29/21 0820  ? loperamide (IMODIUM) capsule 2 mg  2 mg Oral PRN Larita Fife, MD   2 mg at 07/22/21 0858  ? melatonin tablet 5 mg  5 mg Oral QHS Zuleika Gallus T, MD   5 mg at 07/28/21 2111  ? mirtazapine (REMERON) tablet 45 mg  45 mg Oral QHS Nalla Purdy, [name], MD   45 mg at 07/28/21 2111  ? multivitamin with minerals tablet 1 tablet  1 tablet Oral Daily Robbi Scurlock, [name], MD   1 tablet at 07/29/21 0820  ? ondansetron (ZOFRAN-ODT) disintegrating tablet 4 mg  4 mg Oral Q4H PRN Urian Martenson T, MD      ? QUEtiapine (SEROQUEL) tablet 300 mg  300 mg Oral QHS Kerstyn Coryell, [name], MD   300 mg at 07/28/21 2111  ? traZODone (DESYREL) tablet 100  mg  100 mg Oral QHS PRN Krishon Adkison, [name], MD   100 mg at 07/28/21 2111  ? venlafaxine XR (EFFEXOR-XR) 24 hr capsule 225 mg  225 mg Oral Q breakfast Nazaiah Navarrete, [name], MD   225 mg at 07/29/21 0820  ? Vitamin D (Ergocalciferol) (DRISDOL) capsule 50,000 Units  50,000 Units Oral Q7 days Mykira Hofmeister, [name], MD   50,000 Units at 07/23/21 1619  ? ? ?Lab Results: No results found for this or any previous visit (from the past 48 hour(s)). ? ?Blood Alcohol level:  ?Lab Results  ?Component Value Date  ? ETH <10 06/07/2021  ? ETH <10 11/22/2020  ? ? ?Metabolic Disorder Labs: ?Lab Results  ?Component Value Date  ? HGBA1C 5.1 06/07/2021  ? MPG 99.67 06/07/2021  ? MPG 111.15 11/24/2020  ? ?No results found for: PROLACTIN ?Lab Results  ?Component Value Date  ? CHOL 197 06/07/2021  ? TRIG 149 06/07/2021  ? HDL 44 06/07/2021  ? CHOLHDL 4.5 06/07/2021  ? VLDL 30 06/07/2021  ? LDLCALC 123 (H) 06/07/2021  ? LDLCALC 148 (H) 11/24/2020  ? ? ?Physical Findings: ?AIMS:  , ,  ,  ,    ?CIWA:    ?COWS:    ? ?Musculoskeletal: ?Strength & Muscle Tone: within normal limits ?Gait & Station: normal ?Patient leans: N/A ? ?Psychiatric Specialty Exam: ? ?Presentation  ?General Appearance: Appropriate for Environment; Bizarre; Disheveled; Fairly Groomed ? ?Eye Contact:Good ? ?Speech:Clear and Coherent; Normal Rate ? ?Speech Volume:Normal ? ?Handedness:Right ? ? ?Mood and Affect  ?Mood:Anxious; Depressed; Hopeless; Irritable ? ?Affect:Blunt; Depressed ? ? ?Thought Process  ?Thought Processes:Coherent; Linear ? ?Descriptions of Associations:Intact ? ?Orientation:Full (Time, Place and Person) ? ?Thought Content:Logical; WDL ? ?History of Schizophrenia/Schizoaffective disorder:No ? ?Duration of Psychotic Symptoms:N/A ? ?Hallucinations:No data recorded ?Ideas of Reference:None ? ?Suicidal Thoughts:No data recorded ?Homicidal Thoughts:No data recorded ? ?Sensorium  ?Memory:Immediate Fair; Recent Fair; Remote Fair ? ?Judgment:Fair ? ?Insight:Fair ? ? ?Executive  Functions  ?Concentration:Fair ? ?Attention Span:Good ? ?Recall:Good ? ?[place] ? ?Language:Good ? ? ?Psychomotor Activity  ?Psychomotor Activity:No data recorded ? ?Assets  ?Assets:Communication Skills; Physical Health; Social Support ? ? ?Sleep  ?Sleep:No data recorded ? ? ?Physical Exam: ?Physical Exam ?Vitals and nursing note reviewed.  ?Constitutional:   ?   Appearance: Normal appearance.  ?HENT:  ?   Head: Normocephalic and atraumatic.  ?  Mouth/Throat:  ?   Pharynx: Oropharynx is clear.  ?Eyes:  ?   Pupils: Pupils are equal, round, and reactive to light.  ?Cardiovascular:  ?   Rate and Rhythm: Normal rate and regular rhythm.  ?Pulmonary:  ?   Effort: Pulmonary effort is normal.  ?   Breath sounds: Normal breath sounds.  ?Abdominal:  ?   General: Abdomen is flat.  ?   Palpations: Abdomen is soft.  ?Musculoskeletal:     ?   General: Normal range of motion.  ?Skin: ?   General: Skin is warm and dry.  ?Neurological:  ?   General: No focal deficit present.  ?   Mental Status: He is alert. Mental status is at baseline.  ?Psychiatric:     ?   Attention and Perception: Attention normal.     ?   Mood and Affect: Mood normal. Affect is blunt.     ?   Speech: Speech is delayed.     ?   Behavior: Behavior is slowed.     ?   Thought Content: Thought content includes suicidal ideation. Thought content does not include suicidal plan.     ?   Cognition and Memory: Memory is impaired.  ? ?Review of Systems  ?Constitutional: Negative.   ?HENT: Negative.    ?Eyes: Negative.   ?Respiratory: Negative.    ?Cardiovascular: Negative.   ?Gastrointestinal: Negative.   ?Musculoskeletal: Negative.   ?Skin: Negative.   ?Neurological: Negative.   ?Psychiatric/Behavioral:  Positive for depression, memory loss and suicidal ideas. Negative for hallucinations and substance abuse. The patient is nervous/anxious and has insomnia.   ?Blood pressure 114/80, pulse 80, temperature 97.9 ?F (36.6 ?C), temperature source Oral, resp.  rate 16, height '5\' 6"'$  (1.676 m), weight 76.2 kg, SpO2 95 %. Body mass index is 27.12 kg/m?. ? ? ?Treatment Plan Summary: ?Plan follow-up with lithium level tomorrow.  No change to medicine.  Continue trying to enco

## 2021-07-30 LAB — LITHIUM LEVEL: Lithium Lvl: 0.48 mmol/L — ABNORMAL LOW (ref 0.60–1.20)

## 2021-07-30 NOTE — Plan of Care (Signed)
D: Pt alert and oriented. Pt rates depression 8/10, hopelessness 7/10, and anxiety 7/10. Pt goal: "Rest." Pt reports energy level as low and concentration as being poor. Pt reports sleep last night as being fair. Pt did not receive medications for sleep. Pt denies experiencing any pain at this time. Pt denies experiencing any HI, or AVH at this time. Pt endorses SI w/o plan and verbally contracts for safety stating he will let staff know if anything changes. ? ?A: Scheduled medications administered to pt, per MD orders. Support and encouragement provided. Frequent verbal contact made. Routine safety checks conducted q15 minutes.  ? ?R: No adverse drug reactions noted. Pt verbally contracts for safety at this time. Pt compliant with medications. Pt interacts minimally with others on the unit, mostly self isolating to room today with exception to meals and medication administration. Pt remains safe at this time. Will continue to monitor.  ? ?Problem: Coping: ?Goal: Will verbalize feelings ?Outcome: Progressing ?  ?Problem: Activity: ?Goal: Interest or engagement in leisure activities will improve ?Outcome: Not Progressing ?  ?

## 2021-07-30 NOTE — Group Note (Signed)
LCSW Group Therapy Note ? ?Group Date: 07/30/2021 ?Start Time: 1315 ?End Time: 7371 ? ? ?Type of Therapy and Topic:  Group Therapy - Healthy vs Unhealthy Coping Skills ? ?Participation Level:  Minimal  ? ?Description of Group ?The focus of this group was to determine what unhealthy coping techniques typically are used by group members and what healthy coping techniques would be helpful in coping with various problems. Patients were guided in becoming aware of the differences between healthy and unhealthy coping techniques. Patients were asked to identify 2-3 healthy coping skills they would like to learn to use more effectively. ? ?Therapeutic Goals ?Patients learned that coping is what human beings do all day long to deal with various situations in their lives ?Patients defined and discussed healthy vs unhealthy coping techniques ?Patients identified their preferred coping techniques and identified whether these were healthy or unhealthy ?Patients determined 2-3 healthy coping skills they would like to become more familiar with and use more often. ?Patients provided support and ideas to each other ? ? ?Summary of Patient Progress:  Patient presented late to group and participated when prompted. Patient identified deep breathing as a coping skill he finds helpful. Patient shared that medication helps him manage his mental health symptoms; however, it makes him feel sedated and he sleeps a lot as a result. Patient shared that he does not have any family or friends and identified taking medication as his main coping strategy.  ? ? ?Therapeutic Modalities ?Cognitive Behavioral Therapy ?Motivational Interviewing ? ?Kenna Gilbert Wittmann, LCSWA ?07/30/2021  2:50 PM   ?

## 2021-07-30 NOTE — Progress Notes (Addendum)
[facility] MD Progress Note ? ?07/30/2021 10:08 AM ?Casey Silva  ?MRN:  [id number] ?Subjective:  ?Patient reports high-grade depression today, an 8 out of 10 with 10 being high with passive thoughts of suicide with no plan intent drive or preparation here.  He does feel hopeless.  Reports that this is his worst episode of depression.  Denies having any family or friends.  Appetite is good energy is low.  Reports date some daytime sedation on his medications.  He did not sleep very well last night and explains it is because the frequent checks on the unit which wakes him up because he is a light sleeper.  Patient isolates in his room.  No new medical problems. ? ?Principal Problem: Severe recurrent major depression with psychotic features ([place]) ?Diagnosis: Principal Problem: ?  Severe recurrent major depression with psychotic features ([place]) ?Active Problems: ?  Primary insomnia ?  GAD (generalized anxiety disorder) ?  Essential hypertension ? ?Total Time spent with patient: 30 minutes ? ?Past Psychiatric History: Past history of recurrent severe depression partial response to ECT ? ?Past Medical History:  ?Past Medical History:  ?Diagnosis Date  ? Anxiety   ? Asthma   ? Depression   ? History reviewed. No pertinent surgical history. ?Family History:  ?Family History  ?Problem Relation Age of Onset  ? Bipolar disorder Mother   ? ?Family Psychiatric  History: See previous ?Social History:  ?Social History  ? ?Substance and Sexual Activity  ?Alcohol Use No  ?   ?Social History  ? ?Substance and Sexual Activity  ?Drug Use No  ?  ?Social History  ? ?Socioeconomic History  ? Marital status: Single  ?  Spouse name: Not on file  ? Number of children: 1  ? Years of education: Not on file  ? Highest education level: GED or equivalent  ?Occupational History  ? Not on file  ?Tobacco Use  ? Smoking status: Never  ? Smokeless tobacco: Never  ?Vaping Use  ? Vaping Use: Unknown  ?Substance and Sexual Activity  ? Alcohol use: No  ? Drug use:  No  ? Sexual activity: Not Currently  ?Other Topics Concern  ? Not on file  ?Social History Narrative  ? Marital status:  Single   Children: Daughter   Employment:  Unemployment. previous work for [employer].   Alcohol:  None   Drugs:  none   Exercise:  Regularly very   Religion:  Jewish  ? ?Social Determinants of Health  ? ?Financial Resource Strain: Not on file  ?Food Insecurity: Not on file  ?Transportation Needs: Not on file  ?Physical Activity: Not on file  ?Stress: Not on file  ?Social Connections: Not on file  ? ?Additional Social History:  ?  ?  ?  ?  ?  ?  ?  ?  ?  ?  ?  ? ?Sleep: Fair ? ?Appetite:  Fair ? ?Current Medications: ?Current Facility-Administered Medications  ?Medication Dose Route Frequency Provider Last Rate Last Admin  ? acetaminophen (TYLENOL) tablet 650 mg  650 mg Oral Once PRN Darrin Nipper, MD      ? Or  ? acetaminophen (TYLENOL) 160 MG/5ML solution 325-650 mg  325-650 mg Oral Q4H PRN Darrin Nipper, MD      ? amLODipine (NORVASC) tablet 10 mg  10 mg Oral Daily Clapacs, [name], MD   10 mg at 07/30/21 0744  ? diclofenac Sodium (VOLTAREN) 1 % topical gel 2 g  2 g Topical Q12H PRN  Clapacs, [name], MD      ? famotidine (PEPCID) tablet 20 mg  20 mg Oral BID Clapacs, [name], MD   20 mg at 07/30/21 0743  ? feeding supplement (ENSURE ENLIVE / ENSURE PLUS) liquid 237 mL  237 mL Oral BID BM Clapacs, [name], MD   237 mL at 07/30/21 0953  ? hydrOXYzine (ATARAX) tablet 50 mg  50 mg Oral Q6H PRN Clapacs, [name], MD   50 mg at 07/18/21 2103  ? ibuprofen (ADVIL) tablet 600 mg  600 mg Oral Q6H PRN Clapacs, [name], MD   600 mg at 06/27/21 0915  ? lithium carbonate (LITHOBID) CR tablet 300 mg  300 mg Oral Q12H Clapacs, [name], MD   300 mg at 07/30/21 0743  ? loperamide (IMODIUM) capsule 2 mg  2 mg Oral PRN Larita Fife, MD   2 mg at 07/22/21 0858  ? melatonin tablet 5 mg  5 mg Oral QHS Clapacs, [name], MD   5 mg at 07/29/21 2106  ? mirtazapine (REMERON) tablet 45 mg  45 mg Oral QHS Clapacs, [name], MD   45 mg at 07/29/21 2106  ? multivitamin with minerals tablet 1 tablet  1 tablet Oral Daily Clapacs, [name], MD   1 tablet at 07/30/21 0744  ? ondansetron (ZOFRAN-ODT) disintegrating tablet 4 mg  4 mg Oral Q4H PRN Clapacs, [name], MD      ? QUEtiapine (SEROQUEL) tablet 300 mg  300 mg Oral QHS Clapacs, [name], MD   300 mg at 07/29/21 2106  ? traZODone (DESYREL) tablet 100 mg  100 mg Oral QHS PRN Clapacs, [name], MD   100 mg at 07/28/21 2111  ? venlafaxine XR (EFFEXOR-XR) 24 hr capsule 225 mg  225 mg Oral Q breakfast Clapacs, [name], MD   225 mg at 07/30/21 0744  ? Vitamin D (Ergocalciferol) (DRISDOL) capsule 50,000 Units  50,000 Units Oral Q7 days Clapacs, [name], MD   50,000 Units at 07/23/21 1619  ? ? ?Lab Results:  ?Results for orders placed or performed during the hospital encounter of 06/23/21 (from the past 48 hour(s))  ?Lithium level     Status: Abnormal  ? Collection Time: 07/30/21  8:03 AM  ?Result Value Ref Range  ? Lithium Lvl 0.48 (L) 0.60 - 1.20 mmol/L  ?  Comment: Performed at [facility], [address], [place], [place] [address]  ? ? ?Blood Alcohol level:  ?Lab Results  ?Component Value Date  ? ETH <10 06/07/2021  ? ETH <10 11/22/2020  ? ? ?Metabolic Disorder Labs: ?Lab Results  ?Component Value Date  ? HGBA1C 5.1 06/07/2021  ? MPG 99.67 06/07/2021  ? MPG 111.15 11/24/2020  ? ?No results found for: PROLACTIN ?Lab Results  ?Component Value Date  ? CHOL 197 06/07/2021  ? TRIG 149 06/07/2021  ? HDL 44 06/07/2021  ? CHOLHDL 4.5 06/07/2021  ? VLDL 30 06/07/2021  ? LDLCALC 123 (H) 06/07/2021  ? LDLCALC 148 (H) 11/24/2020  ? ? ?Physical Findings: ?AIMS:  , ,  ,  ,    ?CIWA:    ?COWS:    ? ?Musculoskeletal: ?Strength & Muscle Tone: within normal limits ?Gait & Station: normal ?Patient leans: N/A ? ?Psychiatric Specialty Exam: ? ?Presentation  ?General Appearance: poor grooming ?Eye Contact:poor ?Speech:Clear and Coherent; Normal Rate ? ?Speech Volume:Normal ? ?Handedness:Right ? ? ?Mood and  Affect  ?Mood:depressed ?Affect:restricted ? ?Thought Process  ?Thought Processes:Coherent; Linear ? ?Descriptions of  Associations:Intact ? ?Orientation:Full (Time, Place and Person) ? ?Thought Content:Logical; WDL ? ?History of Schizophrenia/Schizoaffective disorder:No ? ?Duration of Psychotic Symptoms:N/A ? ?Hallucinations:No data recorded ?Ideas of Reference:None ? ?Suicidal Thoughts:No data recorded ?Homicidal Thoughts:No data recorded ? ?Sensorium  ?Memory:Immediate Fair; Recent Fair; Remote Fair ? ?Judgment:Fair ? ?Insight:Fair ? ? ?Executive Functions  ?Concentration:Fair ? ?Attention Span:Good ? ?Recall:Good ? ?[place] ? ?Language:Good ? ? ?Psychomotor Activity  ?Psychomotor Activity:No data recorded ? ?Assets  ?Assets:Communication Skills; Physical Health; Social Support ? ? ?Sleep  ?Sleep:No data recorded ? ? ?Physical Exam: ?Physical Exam ?Vitals and nursing note reviewed.  ?Constitutional:   ?   Appearance: Normal appearance.  ?HENT:  ?   Head: Normocephalic and atraumatic.  ?   Mouth/Throat:  ?   Pharynx: Oropharynx is clear.  ?Eyes:  ?   Pupils: Pupils are equal, round, and reactive to light.  ?Cardiovascular:  ?   Rate and Rhythm: Normal rate and regular rhythm.  ?Pulmonary:  ?   Effort: Pulmonary effort is normal.  ?   Breath sounds: Normal breath sounds.  ?Abdominal:  ?   General: Abdomen is flat.  ?   Palpations: Abdomen is soft.  ?Musculoskeletal:     ?   General: Normal range of motion.  ?Skin: ?   General: Skin is warm and dry.  ?Neurological:  ?   General: No focal deficit present.  ?   Mental Status: He is alert. Mental status is at baseline.  ?Psychiatric:     ?   Attention and Perception: Attention normal.     ?   Mood and Affect: Mood normal. Affect is blunt.     ?   Speech: Speech is delayed.     ?   Behavior: Behavior is slowed.     ?   Thought Content: Thought content includes suicidal ideation. Thought content does not include suicidal plan.     ?   Cognition and Memory:  Memory is impaired.  ? ?Blood pressure 118/79, pulse 76, temperature 98.1 ?F (36.7 ?C), temperature source Oral, resp. rate 18, height '5\' 6"'$  (1.676 m), weight 76.2 kg, SpO2 95 %. Body mass index is 27.

## 2021-07-31 NOTE — Progress Notes (Signed)
Patient was cooperative with treatment, he was compliant with medication regime. He still endorses depression, hopelessness  and anxiety. Patient denies SI, HI &AVH. Patient seemed to sleep well through out the night. ?

## 2021-07-31 NOTE — Progress Notes (Signed)
CSW provided patient with contact information for [employer] Wheels, [employer] [facility] [place] and [employer] per patient request for supportive housing/community resources. ? ?Idamae Lusher, MSW, [place], [place] ?07/31/2021 5:35PM ? ? ?

## 2021-07-31 NOTE — Plan of Care (Signed)
D: Pt alert and oriented. Pt rates depression 8/10, hopelessness 7/10, and anxiety 7/10. Pt goal: "Rest." Pt reports energy level as low and concentration as being poor. Pt reports sleep last night as being poor. Pt did not receive medications for sleep. Pt denies experiencing any pain at this time. Pt denies experiencing any HI, or AVH at this time. Pt reports experiencing SI w/o plan, verbally contracts for safety. ? ?Pt attending [place] group. Pt showered, put on clean scrubs, and combed his hair. Late afternoon and into the evening pt can be observed in the dayroom watching TV.  ? ?A: Scheduled medications administered to pt, per MD orders. Support and encouragement provided. Frequent verbal contact made. Routine safety checks conducted q15 minutes.  ? ?R: No adverse drug reactions noted. Pt verbally contracts for safety at this time. Pt compliant with medications and treatment plan. Pt interacts minimally with others on the unit. Pt remains safe at this time. Will continue to monitor.  ? ?Problem: Education: ?Goal: Utilization of techniques to improve thought processes will improve ?Outcome: Not Progressing ?  ?Problem: Activity: ?Goal: Interest or engagement in leisure activities will improve ?Outcome: Not Progressing ?  ?

## 2021-07-31 NOTE — Progress Notes (Signed)
[facility] MD Progress Note ? ?07/31/2021 9:28 AM ?Casey Silva  ?MRN:  [id number] ?Subjective:  ?4/23 ?Patient reports feeling about the same as yesterday, emotionally.  Depression remains severe with passive thoughts of suicide.  Says that the only thing that he looks forward to is a good meal.  He has no family or friends that he can speak to for support.  Reports that he is going to groups.  He is completing his ADLs.  Reports ongoing pains throughout his body which makes him feel older than he is.  "I feel like I am 66 years old."  No new medical problems.  He contracts for safety, has no desire to hurt himself today. ? ?4/22 ?Patient reports high-grade depression today, an 8 out of 10 with 10 being high with passive thoughts of suicide with no plan intent drive or preparation here.  He does feel hopeless.  Reports that this is his worst episode of depression.  Denies having any family or friends.  Appetite is good energy is low.  Reports date some daytime sedation on his medications.  He did not sleep very well last night and explains it is because the frequent checks on the unit which wakes him up because he is a light sleeper.  Patient isolates in his room.  No new medical problems. ? ?Principal Problem: Severe recurrent major depression with psychotic features ([place]) ?Diagnosis: Principal Problem: ?  Severe recurrent major depression with psychotic features ([place]) ?Active Problems: ?  Primary insomnia ?  GAD (generalized anxiety disorder) ?  Essential hypertension ? ?Total Time spent with patient: 30 minutes ? ?Past Psychiatric History: Past history of recurrent severe depression partial response to ECT ? ?Past Medical History:  ?Past Medical History:  ?Diagnosis Date  ? Anxiety   ? Asthma   ? Depression   ? History reviewed. No pertinent surgical history. ?Family History:  ?Family History  ?Problem Relation Age of Onset  ? Bipolar disorder Mother   ? ?Family Psychiatric  History: See previous ?Social History:  ?Social  History  ? ?Substance and Sexual Activity  ?Alcohol Use No  ?   ?Social History  ? ?Substance and Sexual Activity  ?Drug Use No  ?  ?Social History  ? ?Socioeconomic History  ? Marital status: Single  ?  Spouse name: Not on file  ? Number of children: 1  ? Years of education: Not on file  ? Highest education level: GED or equivalent  ?Occupational History  ? Not on file  ?Tobacco Use  ? Smoking status: Never  ? Smokeless tobacco: Never  ?Vaping Use  ? Vaping Use: Unknown  ?Substance and Sexual Activity  ? Alcohol use: No  ? Drug use: No  ? Sexual activity: Not Currently  ?Other Topics Concern  ? Not on file  ?Social History Narrative  ? Marital status:  Single   Children: Daughter   Employment:  Unemployment. previous work for [employer].   Alcohol:  None   Drugs:  none   Exercise:  Regularly very   Religion:  Jewish  ? ?Social Determinants of Health  ? ?Financial Resource Strain: Not on file  ?Food Insecurity: Not on file  ?Transportation Needs: Not on file  ?Physical Activity: Not on file  ?Stress: Not on file  ?Social Connections: Not on file  ? ?Additional Social History:  ?  ?  ?  ?  ?  ?  ?  ?  ?  ?  ?  ? ?Sleep: Fair ? ?  Appetite:  Fair ? ?Current Medications: ?Current Facility-Administered Medications  ?Medication Dose Route Frequency Provider Last Rate Last Admin  ? acetaminophen (TYLENOL) tablet 650 mg  650 mg Oral Once PRN Darrin Nipper, MD      ? Or  ? acetaminophen (TYLENOL) 160 MG/5ML solution 325-650 mg  325-650 mg Oral Q4H PRN Darrin Nipper, MD      ? amLODipine (NORVASC) tablet 10 mg  10 mg Oral Daily Clapacs, [name], MD   10 mg at 07/31/21 5462  ? diclofenac Sodium (VOLTAREN) 1 % topical gel 2 g  2 g Topical Q12H PRN Clapacs, [name], MD      ? famotidine (PEPCID) tablet 20 mg  20 mg Oral BID Clapacs, [name], MD   20 mg at 07/31/21 7035  ? feeding supplement (ENSURE ENLIVE / ENSURE PLUS) liquid 237 mL  237 mL Oral BID BM Clapacs, [name], MD   237 mL at 07/30/21 1449  ? hydrOXYzine (ATARAX)  tablet 50 mg  50 mg Oral Q6H PRN Clapacs, [name], MD   50 mg at 07/18/21 2103  ? ibuprofen (ADVIL) tablet 600 mg  600 mg Oral Q6H PRN Clapacs, [name], MD   600 mg at 06/27/21 0915  ? lithium carbonate (LITHOBID) CR tablet 300 mg  300 mg Oral Q12H Clapacs, [name], MD   300 mg at 07/31/21 0093  ? loperamide (IMODIUM) capsule 2 mg  2 mg Oral PRN Larita Fife, MD   2 mg at 07/22/21 0858  ? melatonin tablet 5 mg  5 mg Oral QHS Clapacs, [name], MD   5 mg at 07/30/21 2111  ? mirtazapine (REMERON) tablet 45 mg  45 mg Oral QHS Clapacs, [name], MD   45 mg at 07/30/21 2111  ? multivitamin with minerals tablet 1 tablet  1 tablet Oral Daily Clapacs, [name], MD   1 tablet at 07/31/21 8182  ? ondansetron (ZOFRAN-ODT) disintegrating tablet 4 mg  4 mg Oral Q4H PRN Clapacs, [name], MD      ? QUEtiapine (SEROQUEL) tablet 300 mg  300 mg Oral QHS Clapacs, [name], MD   300 mg at 07/30/21 2111  ? traZODone (DESYREL) tablet 100 mg  100 mg Oral QHS PRN Clapacs, [name], MD   100 mg at 07/28/21 2111  ? venlafaxine XR (EFFEXOR-XR) 24 hr capsule 225 mg  225 mg Oral Q breakfast Clapacs, [name], MD   225 mg at 07/31/21 9937  ? Vitamin D (Ergocalciferol) (DRISDOL) capsule 50,000 Units  50,000 Units Oral Q7 days Clapacs, [name], MD   50,000 Units at 07/30/21 1707  ? ? ?Lab Results:  ?Results for orders placed or performed during the hospital encounter of 06/23/21 (from the past 48 hour(s))  ?Lithium level     Status: Abnormal  ? Collection Time: 07/30/21  8:03 AM  ?Result Value Ref Range  ? Lithium Lvl 0.48 (L) 0.60 - 1.20 mmol/L  ?  Comment: Performed at [facility], [address], [place], [place] [address]  ? ? ?Blood Alcohol level:  ?Lab Results  ?Component Value Date  ? ETH <10 06/07/2021  ? ETH <10 11/22/2020  ? ? ?Metabolic Disorder Labs: ?Lab Results  ?Component Value Date  ? HGBA1C 5.1 06/07/2021  ? MPG 99.67 06/07/2021  ? MPG 111.15 11/24/2020  ? ?No results found for: PROLACTIN ?Lab Results  ?Component Value Date  ? CHOL 197  06/07/2021  ? TRIG 149 06/07/2021  ? HDL 44  06/07/2021  ? CHOLHDL 4.5 06/07/2021  ? VLDL 30 06/07/2021  ? LDLCALC 123 (H) 06/07/2021  ? LDLCALC 148 (H) 11/24/2020  ? ? ?Physical Findings: ?AIMS:  , ,  ,  ,    ?CIWA:    ?COWS:    ? ?Musculoskeletal: ?Strength & Muscle Tone: within normal limits ?Gait & Station: normal  ?Patient leans: N/A ? ?Psychiatric Specialty Exam: ? ?Presentation  ?General Appearance: poor grooming, unshaven.  ?Eye Contact:poor ?Speech:Clear and Coherent; Normal Rate ? ?Speech Volume:Normal ? ?Handedness:Right ? ? ?Mood and Affect  ?Mood:depressed ?Affect:constricted ? ?Thought Process  ?Thought Processes:Coherent; Linear ? ?Descriptions of Associations:Intact ? ?Orientation:Full (Time, Place and Person) ? ?Thought Content:Logical; WDL ? ?History of Schizophrenia/Schizoaffective disorder:No ? ?Duration of Psychotic Symptoms:N/A ? ?Hallucinations:No data recorded ?Ideas of Reference:None ? ?Suicidal Thoughts:No data recorded ?Homicidal Thoughts:No data recorded ? ?Sensorium  ?Memory:Immediate Fair; Recent Fair; Remote Fair ? ?Judgment:Fair ? ?Insight:Fair ? ? ?Executive Functions  ?Concentration:Fair ? ?Attention Span:Good ? ?Recall:Good ? ?[place] ? ?Language:Good ? ? ?Psychomotor Activity  ?Psychomotor Activity:No data recorded ? ?Assets  ?Assets:Communication Skills; Physical Health; Social Support ? ? ?Sleep  ?Sleep:No data recorded ? ? ?Physical Exam: ?Physical Exam ?Vitals and nursing note reviewed.  ?Constitutional:   ?   Appearance: Normal appearance.  ?HENT:  ?   Head: Normocephalic and atraumatic.  ?   Mouth/Throat:  ?   Pharynx: Oropharynx is clear.  ?Eyes:  ?   Pupils: Pupils are equal, round, and reactive to light.  ?Cardiovascular:  ?   Rate and Rhythm: Normal rate and regular rhythm.  ?Pulmonary:  ?   Effort: Pulmonary effort is normal.  ?   Breath sounds: Normal breath sounds.  ?Abdominal:  ?   General: Abdomen is flat.  ?   Palpations: Abdomen is soft.   ?Musculoskeletal:     ?   General: Normal range of motion.  ?Skin: ?   General: Skin is warm and dry.  ?Neurological:  ?   General: No focal deficit present.  ?   Mental Status: He is alert. Mental status is a

## 2021-07-31 NOTE — Group Note (Signed)
[place] LCSW Group Therapy Note ? ? ?Group Date: 07/31/2021 ?Start Time: 1330 ?End Time: 1430 ? ? ?Type of Therapy and Topic: Group Therapy: Avoiding Self-Sabotaging and Enabling Behaviors ? ?Participation Level: Active ? ? ?Description of Group:  ?In this group, patients will learn how to identify obstacles, self-sabotaging and enabling behaviors, as well as: what are they, why do we do them and what needs these behaviors meet. Discuss unhealthy relationships and how to have positive healthy boundaries with those that sabotage and enable. Explore aspects of self-sabotage and enabling in yourself and how to limit these self-destructive behaviors in everyday life. ? ? ?Therapeutic Goals: ?1. Patient will identify one obstacle that relates to self-sabotage and enabling behaviors ?2. Patient will identify one personal self-sabotaging or enabling behavior they did prior to admission ?3. Patient will state a plan to change the above identified behavior ?4. Patient will demonstrate ability to communicate their needs through discussion and/or role play.  ? ? ?Summary of Patient Progress:  Patient presented late to group due to showering at the time the session began. Patient contributed to group discussion and was observed actively listening to other members contributions. Patient shared that overeating is a self-sabotaging behavior he has engaged in. Patient stated that it is difficult for him to discern between mental health symptoms and medication side effects, sharing that he has had difficulty sleeping lately and feels "sluggish." Patient shared that he has enjoyed being hospitalized and cared for and expressed that he wishes there was a facility like the hospital setting he could live at full time.  ? ? ? ?Therapeutic Modalities:  ?Cognitive Behavioral Therapy ?Person-Centered Therapy ?Motivational Interviewing ? ? ? ?Kenna Gilbert Safiatou Islam, LCSWA ?

## 2021-08-01 MED ORDER — LITHIUM CARBONATE ER 450 MG PO TBCR
450.0000 mg | EXTENDED_RELEASE_TABLET | Freq: Two times a day (BID) | ORAL | Status: DC
  Administered 2021-08-01 – 2021-08-03 (×4): 450 mg via ORAL
  Filled 2021-08-01 (×4): qty 1

## 2021-08-01 NOTE — Group Note (Signed)
[place] LCSW Group Therapy Note ? ? ? ?Group Date: 08/01/2021 ?Start Time: 1300 ?End Time: 1400 ? ?Type of Therapy and Topic:  Group Therapy:  Overcoming Obstacles ? ?Participation Level:  [facility] PARTICIPATION LEVEL: Did Not Attend ? ?Mood: ? ?Description of Group:   ?In this group patients will be encouraged to explore what they see as obstacles to their own wellness and recovery. They will be guided to discuss their thoughts, feelings, and behaviors related to these obstacles. The group will process together ways to cope with barriers, with attention given to specific choices patients can make. Each patient will be challenged to identify changes they are motivated to make in order to overcome their obstacles. This group will be process-oriented, with patients participating in exploration of their own experiences as well as giving and receiving support and challenge from other group members. ? ?Therapeutic Goals: ?1. Patient will identify personal and current obstacles as they relate to admission. ?2. Patient will identify barriers that currently interfere with their wellness or overcoming obstacles.  ?3. Patient will identify feelings, thought process and behaviors related to these barriers. ?4. Patient will identify two changes they are willing to make to overcome these obstacles:  ? ? ?Summary of Patient Progress ? ? ?Patient did not attend group despite encouraged participation.  ? ? ?Therapeutic Modalities:   ?Cognitive Behavioral Therapy ?Solution Focused Therapy ?Motivational Interviewing ?Relapse Prevention Therapy ? ? ?Durenda Hurt, LCSWA ?

## 2021-08-01 NOTE — Plan of Care (Signed)
Patient stated that he is just tired today. Stayed in bed most of the shift. Stated that his depression little better and passive suicidal thoughts at times. Appetite and energy level good. Support and encouragement given. ?

## 2021-08-01 NOTE — Progress Notes (Signed)
Recreation Therapy Notes ? ? ?Date: 08/01/2021 ? ?Time: 10:45 am   ? ?Location: Courtyard     ? ?Behavioral response: N/A ?  ?Intervention Topic: Leisure    ? ?Discussion/Intervention: ?Patient refused to attend group.  ? ?Clinical Observations/Feedback:  ?Patient refused to attend group.  ?  ?[name] LRT/CTRS ? ? ? ? ? ? ? ?Casey Silva ?08/01/2021 12:40 PM ? ? ? ? ? ? ? ?Casey Silva ?08/01/2021 12:40 PM ?

## 2021-08-01 NOTE — Progress Notes (Signed)
Patient alert and oriented x 4 affect is flat but brightens upon approach, he denies SI/HI/AVH no distress noted this evening, he appears  less anxious and interacting with peers and staff. Patient is complaint with medication regimen. He was offered emotional support and encouragement. 15 minutes safety checks noted will continue to monitor.  ?

## 2021-08-01 NOTE — Progress Notes (Signed)
[facility] MD Progress Note ? ?08/01/2021 11:03 AM ?Casey Silva  ?MRN:  [id number] ?Subjective: Patient seen today.  Once again he says that he thinks his mood and depression are a little bit better.  He denies active suicidal thoughts but admits he still has passive suicidal thoughts at times.  Patient continues to be very withdrawn and staying in his bed almost all of the time.  Denies psychotic symptoms.  Compliant with medicine.  He had a lithium level done this weekend which was 0.48 ?Principal Problem: Severe recurrent major depression with psychotic features ([place]) ?Diagnosis: Principal Problem: ?  Severe recurrent major depression with psychotic features ([place]) ?Active Problems: ?  Primary insomnia ?  GAD (generalized anxiety disorder) ?  Essential hypertension ? ?Total Time spent with patient: 30 minutes ? ?Past Psychiatric History: Past history of depression possible bipolar disorder ? ?Past Medical History:  ?Past Medical History:  ?Diagnosis Date  ? Anxiety   ? Asthma   ? Depression   ? History reviewed. No pertinent surgical history. ?Family History:  ?Family History  ?Problem Relation Age of Onset  ? Bipolar disorder Mother   ? ?Family Psychiatric  History: See previous ?Social History:  ?Social History  ? ?Substance and Sexual Activity  ?Alcohol Use No  ?   ?Social History  ? ?Substance and Sexual Activity  ?Drug Use No  ?  ?Social History  ? ?Socioeconomic History  ? Marital status: Single  ?  Spouse name: Not on file  ? Number of children: 1  ? Years of education: Not on file  ? Highest education level: GED or equivalent  ?Occupational History  ? Not on file  ?Tobacco Use  ? Smoking status: Never  ? Smokeless tobacco: Never  ?Vaping Use  ? Vaping Use: Unknown  ?Substance and Sexual Activity  ? Alcohol use: No  ? Drug use: No  ? Sexual activity: Not Currently  ?Other Topics Concern  ? Not on file  ?Social History Narrative  ? Marital status:  Single   Children: Daughter   Employment:  Unemployment. previous  work for [employer].   Alcohol:  None   Drugs:  none   Exercise:  Regularly very   Religion:  Jewish  ? ?Social Determinants of Health  ? ?Financial Resource Strain: Not on file  ?Food Insecurity: Not on file  ?Transportation Needs: Not on file  ?Physical Activity: Not on file  ?Stress: Not on file  ?Social Connections: Not on file  ? ?Additional Social History:  ?  ?  ?  ?  ?  ?  ?  ?  ?  ?  ?  ? ?Sleep: Fair ? ?Appetite:  Fair ? ?Current Medications: ?Current Facility-Administered Medications  ?Medication Dose Route Frequency Provider Last Rate Last Admin  ? acetaminophen (TYLENOL) tablet 650 mg  650 mg Oral Once PRN Darrin Nipper, MD      ? Or  ? acetaminophen (TYLENOL) 160 MG/5ML solution 325-650 mg  325-650 mg Oral Q4H PRN Darrin Nipper, MD      ? amLODipine (NORVASC) tablet 10 mg  10 mg Oral Daily Srija Southard, [name], MD   10 mg at 08/01/21 2542  ? diclofenac Sodium (VOLTAREN) 1 % topical gel 2 g  2 g Topical Q12H PRN Bristyl Mclees T, MD      ? famotidine (PEPCID) tablet 20 mg  20 mg Oral BID Jaythan Hinely, [name], MD   20 mg at 08/01/21 7062  ? feeding supplement (ENSURE ENLIVE /  ENSURE PLUS) liquid 237 mL  237 mL Oral BID BM Novaleigh Kohlman T, MD   237 mL at 07/31/21 1458  ? hydrOXYzine (ATARAX) tablet 50 mg  50 mg Oral Q6H PRN Ariabella Brien, [name], MD   50 mg at 07/18/21 2103  ? ibuprofen (ADVIL) tablet 600 mg  600 mg Oral Q6H PRN Nyla Creason, [name], MD   600 mg at 06/27/21 0915  ? lithium carbonate (ESKALITH) CR tablet 450 mg  450 mg Oral Q12H Lilliah Priego T, MD      ? loperamide (IMODIUM) capsule 2 mg  2 mg Oral PRN Larita Fife, MD   2 mg at 07/22/21 0858  ? melatonin tablet 5 mg  5 mg Oral QHS Hannie Shoe, [name], MD   5 mg at 07/31/21 2101  ? mirtazapine (REMERON) tablet 45 mg  45 mg Oral QHS Birdia Jaycox, [name], MD   45 mg at 07/31/21 2101  ? multivitamin with minerals tablet 1 tablet  1 tablet Oral Daily Aynslee Mulhall, [name], MD   1 tablet at 08/01/21 7591  ? ondansetron (ZOFRAN-ODT) disintegrating tablet 4 mg  4 mg Oral  Q4H PRN Aurie Harroun T, MD      ? QUEtiapine (SEROQUEL) tablet 300 mg  300 mg Oral QHS Gessica Jawad, [name], MD   300 mg at 07/31/21 2101  ? traZODone (DESYREL) tablet 100 mg  100 mg Oral QHS PRN Allannah Kempen, [name], MD   100 mg at 07/31/21 2101  ? venlafaxine XR (EFFEXOR-XR) 24 hr capsule 225 mg  225 mg Oral Q breakfast Derika Eckles, [name], MD   225 mg at 08/01/21 6384  ? Vitamin D (Ergocalciferol) (DRISDOL) capsule 50,000 Units  50,000 Units Oral Q7 days Chalsey Leeth, [name], MD   50,000 Units at 07/30/21 1707  ? ? ?Lab Results: No results found for this or any previous visit (from the past 48 hour(s)). ? ?Blood Alcohol level:  ?Lab Results  ?Component Value Date  ? ETH <10 06/07/2021  ? ETH <10 11/22/2020  ? ? ?Metabolic Disorder Labs: ?Lab Results  ?Component Value Date  ? HGBA1C 5.1 06/07/2021  ? MPG 99.67 06/07/2021  ? MPG 111.15 11/24/2020  ? ?No results found for: PROLACTIN ?Lab Results  ?Component Value Date  ? CHOL 197 06/07/2021  ? TRIG 149 06/07/2021  ? HDL 44 06/07/2021  ? CHOLHDL 4.5 06/07/2021  ? VLDL 30 06/07/2021  ? LDLCALC 123 (H) 06/07/2021  ? LDLCALC 148 (H) 11/24/2020  ? ? ?Physical Findings: ?AIMS:  , ,  ,  ,    ?CIWA:    ?COWS:    ? ?Musculoskeletal: ?Strength & Muscle Tone: within normal limits ?Gait & Station: normal ?Patient leans: N/A ? ?Psychiatric Specialty Exam: ? ?Presentation  ?General Appearance: Appropriate for Environment; Bizarre; Disheveled; Fairly Groomed ? ?Eye Contact:Good ? ?Speech:Clear and Coherent; Normal Rate ? ?Speech Volume:Normal ? ?Handedness:Right ? ? ?Mood and Affect  ?Mood:Anxious; Depressed; Hopeless; Irritable ? ?Affect:Blunt; Depressed ? ? ?Thought Process  ?Thought Processes:Coherent; Linear ? ?Descriptions of Associations:Intact ? ?Orientation:Full (Time, Place and Person) ? ?Thought Content:Logical; WDL ? ?History of Schizophrenia/Schizoaffective disorder:No ? ?Duration of Psychotic Symptoms:N/A ? ?Hallucinations:No data recorded ?Ideas of Reference:None ? ?Suicidal  Thoughts:No data recorded ?Homicidal Thoughts:No data recorded ? ?Sensorium  ?Memory:Immediate Fair; Recent Fair; Remote Fair ? ?Judgment:Fair ? ?Insight:Fair ? ? ?Executive Functions  ?Concentration:Fair ? ?Attention Span:Good ? ?Recall:Good ? ?[place] ? ?Language:Good ? ? ?Psychomotor Activity  ?Psychomotor Activity:No data recorded ? ?Assets  ?Assets:Communication Skills; Physical  Health; Social Support ? ? ?Sleep  ?Sleep:No data recorded ? ? ?Physical Exam: ?Physical Exam ?Vitals and nursing note reviewed.  ?Constitutional:   ?   Appearance: Normal appearance.  ?HENT:  ?   Head: Normocephalic and atraumatic.  ?   Mouth/Throat:  ?   Pharynx: Oropharynx is clear.  ?Eyes:  ?   Pupils: Pupils are equal, round, and reactive to light.  ?Cardiovascular:  ?   Rate and Rhythm: Normal rate and regular rhythm.  ?Pulmonary:  ?   Effort: Pulmonary effort is normal.  ?   Breath sounds: Normal breath sounds.  ?Abdominal:  ?   General: Abdomen is flat.  ?   Palpations: Abdomen is soft.  ?Musculoskeletal:     ?   General: Normal range of motion.  ?Skin: ?   General: Skin is warm and dry.  ?Neurological:  ?   General: No focal deficit present.  ?   Mental Status: He is alert. Mental status is at baseline.  ?Psychiatric:     ?   Attention and Perception: He is inattentive.     ?   Mood and Affect: Mood normal. Affect is blunt.     ?   Speech: Speech is delayed.     ?   Behavior: Behavior is slowed.     ?   Thought Content: Thought content normal.     ?   Cognition and Memory: Cognition is impaired.  ? ?Review of Systems  ?Constitutional: Negative.   ?HENT: Negative.    ?Eyes: Negative.   ?Respiratory: Negative.    ?Cardiovascular: Negative.   ?Gastrointestinal: Negative.   ?Musculoskeletal: Negative.   ?Skin: Negative.   ?Neurological: Negative.   ?Psychiatric/Behavioral:  Positive for depression. Negative for suicidal ideas.   ?Blood pressure 113/82, pulse 76, temperature 98.4 ?F (36.9 ?C), temperature source Oral,  resp. rate 18, height '5\' 6"'$  (1.676 m), weight 76.2 kg, SpO2 97 %. Body mass index is 27.12 kg/m?. ? ? ?Treatment Plan Summary: ?Medication management and Plan increase lithium to 450 mg twice a day because of b

## 2021-08-02 ENCOUNTER — Other Ambulatory Visit: Payer: Self-pay

## 2021-08-02 MED ORDER — VENLAFAXINE HCL ER 75 MG PO CP24
225.0000 mg | ORAL_CAPSULE | Freq: Every day | ORAL | 0 refills | Status: DC
  Filled 2021-08-02: qty 90, 30d supply, fill #0

## 2021-08-02 MED ORDER — VITAMIN D (ERGOCALCIFEROL) 1.25 MG (50000 UNIT) PO CAPS
50000.0000 [IU] | ORAL_CAPSULE | ORAL | 0 refills | Status: DC
  Filled 2021-08-02: qty 5, 35d supply, fill #0

## 2021-08-02 MED ORDER — FAMOTIDINE 20 MG PO TABS
20.0000 mg | ORAL_TABLET | Freq: Two times a day (BID) | ORAL | 0 refills | Status: DC
  Filled 2021-08-02: qty 60, 30d supply, fill #0

## 2021-08-02 MED ORDER — MIRTAZAPINE 45 MG PO TABS
45.0000 mg | ORAL_TABLET | Freq: Every day | ORAL | 0 refills | Status: DC
  Filled 2021-08-02: qty 30, 30d supply, fill #0

## 2021-08-02 MED ORDER — LITHIUM CARBONATE ER 450 MG PO TBCR
450.0000 mg | EXTENDED_RELEASE_TABLET | Freq: Two times a day (BID) | ORAL | 0 refills | Status: DC
  Filled 2021-08-02: qty 60, 30d supply, fill #0

## 2021-08-02 MED ORDER — AMLODIPINE BESYLATE 10 MG PO TABS
10.0000 mg | ORAL_TABLET | Freq: Every day | ORAL | 0 refills | Status: DC
Start: 2021-08-03 — End: 2021-08-03
  Filled 2021-08-02: qty 30, 30d supply, fill #0

## 2021-08-02 MED ORDER — QUETIAPINE FUMARATE 300 MG PO TABS
300.0000 mg | ORAL_TABLET | Freq: Every day | ORAL | 0 refills | Status: DC
  Filled 2021-08-02: qty 30, 30d supply, fill #0

## 2021-08-02 NOTE — Progress Notes (Signed)
Patient was isolative to his room until snack time.  Came out to eat for snack and attempted to watch the [employer] games.  Went back to his room, and when asked why he wasn't watching the game he replied -"I forgot what channel it was on, and anyway Im tired and ready for bed" He denies hi avh continues to endorse passive si. Encouraged  him to seek staff and will continue to monitor with q15  safety checks. ? ? ?C Butler-Nicholson, LPN ?

## 2021-08-02 NOTE — Group Note (Signed)
[place] LCSW Group Therapy Note ? ? ?Group Date: 08/02/2021 ?Start Time: 1400 ?End Time: 1500 ? ?Type of Therapy/Topic:  Group Therapy:  Feelings about Diagnosis ? ?Participation Level:  Active  ? ?Description of Group:   ? This group will allow patients to explore their thoughts and feelings about diagnoses they have received. Patients will be guided to explore their level of understanding and acceptance of these diagnoses. Facilitator will encourage patients to process their thoughts and feelings about the reactions of others to their diagnosis, and will guide patients in identifying ways to discuss their diagnosis with significant others in their lives. This group will be process-oriented, with patients participating in exploration of their own experiences as well as giving and receiving support and challenge from other group members. ? ? ?Therapeutic Goals: ?1. Patient will demonstrate understanding of diagnosis as evidence by identifying two or more symptoms of the disorder:  ?2. Patient will be able to express two feelings regarding the diagnosis ?3. Patient will demonstrate ability to communicate their needs through discussion and/or role plays ? ?Summary of Patient Progress: ?Patient was present in group.  Patient was an active participant. Patient shared how he has "lost my memory" since receiving ECT treatment.  Patient reports that he no longer remembers the street that he lives on.  He reports that he had some memory issues prior to admission "though not this bad".  Patient was able to identify that he does not have any supports.  ? ?Therapeutic Modalities:   ?Cognitive Behavioral Therapy ?Brief Therapy ?Feelings Identification  ? ? ?Rozann Lesches, LCSW ?

## 2021-08-02 NOTE — Progress Notes (Signed)
[facility] MD Progress Note ? ?08/02/2021 12:11 PM ?Casey Silva  ?MRN:  [id number] ?Subjective: Follow-up for this 66 year old man with depression.  Status post 10 ECT treatments and multiple medication trials.  Patient continues to be low energy but staying pretty isolated to his room most of the time.  I saw him today and reminded him that the treatment plan is for discharge tomorrow.  Talked with him about how work had been a big part of his life in the past and it would probably be very much to his benefit to try to get back to work when he is discharged.  Patient has not made any suicide attempts in the hospital and is not talking about active suicidal ideation does not appear to be psychotic and does appear to be tolerating medicine adequately. ?Principal Problem: Severe recurrent major depression with psychotic features ([place]) ?Diagnosis: Principal Problem: ?  Severe recurrent major depression with psychotic features ([place]) ?Active Problems: ?  Primary insomnia ?  GAD (generalized anxiety disorder) ?  Essential hypertension ? ?Total Time spent with patient: 30 minutes ? ?Past Psychiatric History: History of recurrent depression ? ?Past Medical History:  ?Past Medical History:  ?Diagnosis Date  ? Anxiety   ? Asthma   ? Depression   ? History reviewed. No pertinent surgical history. ?Family History:  ?Family History  ?Problem Relation Age of Onset  ? Bipolar disorder Mother   ? ?Family Psychiatric  History: Mother had bipolar disorder ?Social History:  ?Social History  ? ?Substance and Sexual Activity  ?Alcohol Use No  ?   ?Social History  ? ?Substance and Sexual Activity  ?Drug Use No  ?  ?Social History  ? ?Socioeconomic History  ? Marital status: Single  ?  Spouse name: Not on file  ? Number of children: 1  ? Years of education: Not on file  ? Highest education level: GED or equivalent  ?Occupational History  ? Not on file  ?Tobacco Use  ? Smoking status: Never  ? Smokeless tobacco: Never  ?Vaping Use  ? Vaping Use:  Unknown  ?Substance and Sexual Activity  ? Alcohol use: No  ? Drug use: No  ? Sexual activity: Not Currently  ?Other Topics Concern  ? Not on file  ?Social History Narrative  ? Marital status:  Single   Children: Daughter   Employment:  Unemployment. previous work for [employer].   Alcohol:  None   Drugs:  none   Exercise:  Regularly very   Religion:  Jewish  ? ?Social Determinants of Health  ? ?Financial Resource Strain: Not on file  ?Food Insecurity: Not on file  ?Transportation Needs: Not on file  ?Physical Activity: Not on file  ?Stress: Not on file  ?Social Connections: Not on file  ? ?Additional Social History:  ?  ?  ?  ?  ?  ?  ?  ?  ?  ?  ?  ? ?Sleep: Fair ? ?Appetite:  Fair ? ?Current Medications: ?Current Facility-Administered Medications  ?Medication Dose Route Frequency Provider Last Rate Last Admin  ? acetaminophen (TYLENOL) tablet 650 mg  650 mg Oral Once PRN Darrin Nipper, MD      ? Or  ? acetaminophen (TYLENOL) 160 MG/5ML solution 325-650 mg  325-650 mg Oral Q4H PRN Darrin Nipper, MD      ? amLODipine (NORVASC) tablet 10 mg  10 mg Oral Daily Carisa Backhaus, [name], MD   10 mg at 08/02/21 0841  ? diclofenac Sodium (VOLTAREN) 1 %  topical gel 2 g  2 g Topical Q12H PRN Rosann Gorum T, MD      ? famotidine (PEPCID) tablet 20 mg  20 mg Oral BID Braxxton Stoudt, [name], MD   20 mg at 08/02/21 0841  ? feeding supplement (ENSURE ENLIVE / ENSURE PLUS) liquid 237 mL  237 mL Oral BID BM Satrina Magallanes T, MD   237 mL at 08/02/21 0945  ? hydrOXYzine (ATARAX) tablet 50 mg  50 mg Oral Q6H PRN Travelle Mcclimans, [name], MD   50 mg at 08/01/21 2106  ? ibuprofen (ADVIL) tablet 600 mg  600 mg Oral Q6H PRN Gabriele Zwilling, [name], MD   600 mg at 06/27/21 0915  ? lithium carbonate (ESKALITH) CR tablet 450 mg  450 mg Oral Q12H De Libman, [name], MD   450 mg at 08/02/21 0841  ? loperamide (IMODIUM) capsule 2 mg  2 mg Oral PRN Larita Fife, MD   2 mg at 07/22/21 0858  ? melatonin tablet 5 mg  5 mg Oral QHS Janyah Singleterry, [name], MD   5 mg at 08/01/21  2106  ? mirtazapine (REMERON) tablet 45 mg  45 mg Oral QHS Eduar Kumpf, [name], MD   45 mg at 08/01/21 2106  ? multivitamin with minerals tablet 1 tablet  1 tablet Oral Daily Francies Inch, [name], MD   1 tablet at 08/02/21 0841  ? ondansetron (ZOFRAN-ODT) disintegrating tablet 4 mg  4 mg Oral Q4H PRN Makayleigh Poliquin T, MD      ? QUEtiapine (SEROQUEL) tablet 300 mg  300 mg Oral QHS Khrystina Bonnes, [name], MD   300 mg at 08/01/21 2106  ? traZODone (DESYREL) tablet 100 mg  100 mg Oral QHS PRN Tarnesha Ulloa, [name], MD   100 mg at 08/01/21 2106  ? venlafaxine XR (EFFEXOR-XR) 24 hr capsule 225 mg  225 mg Oral Q breakfast Lilygrace Rodick, [name], MD   225 mg at 08/02/21 0841  ? Vitamin D (Ergocalciferol) (DRISDOL) capsule 50,000 Units  50,000 Units Oral Q7 days Kelby Adell, [name], MD   50,000 Units at 07/30/21 1707  ? ? ?Lab Results: No results found for this or any previous visit (from the past 48 hour(s)). ? ?Blood Alcohol level:  ?Lab Results  ?Component Value Date  ? ETH <10 06/07/2021  ? ETH <10 11/22/2020  ? ? ?Metabolic Disorder Labs: ?Lab Results  ?Component Value Date  ? HGBA1C 5.1 06/07/2021  ? MPG 99.67 06/07/2021  ? MPG 111.15 11/24/2020  ? ?No results found for: PROLACTIN ?Lab Results  ?Component Value Date  ? CHOL 197 06/07/2021  ? TRIG 149 06/07/2021  ? HDL 44 06/07/2021  ? CHOLHDL 4.5 06/07/2021  ? VLDL 30 06/07/2021  ? LDLCALC 123 (H) 06/07/2021  ? LDLCALC 148 (H) 11/24/2020  ? ? ?Physical Findings: ?AIMS:  , ,  ,  ,    ?CIWA:    ?COWS:    ? ?Musculoskeletal: ?Strength & Muscle Tone: within normal limits ?Gait & Station: normal ?Patient leans: N/A ? ?Psychiatric Specialty Exam: ? ?Presentation  ?General Appearance: Appropriate for Environment; Bizarre; Disheveled; Fairly Groomed ? ?Eye Contact:Good ? ?Speech:Clear and Coherent; Normal Rate ? ?Speech Volume:Normal ? ?Handedness:Right ? ? ?Mood and Affect  ?Mood:Anxious; Depressed; Hopeless; Irritable ? ?Affect:Blunt; Depressed ? ? ?Thought Process  ?Thought Processes:Coherent;  Linear ? ?Descriptions of Associations:Intact ? ?Orientation:Full (Time, Place and Person) ? ?Thought Content:Logical; WDL ? ?History of Schizophrenia/Schizoaffective disorder:No ? ?Duration of Psychotic Symptoms:N/A ? ?Hallucinations:No data recorded ?Ideas of Reference:None ? ?Suicidal Thoughts:No  data recorded ?Homicidal Thoughts:No data recorded ? ?Sensorium  ?Memory:Immediate Fair; Recent Fair; Remote Fair ? ?Judgment:Fair ? ?Insight:Fair ? ? ?Executive Functions  ?Concentration:Fair ? ?Attention Span:Good ? ?Recall:Good ? ?[place] ? ?Language:Good ? ? ?Psychomotor Activity  ?Psychomotor Activity:No data recorded ? ?Assets  ?Assets:Communication Skills; Physical Health; Social Support ? ? ?Sleep  ?Sleep:No data recorded ? ? ?Physical Exam: ?Physical Exam ?Vitals and nursing note reviewed.  ?Constitutional:   ?   Appearance: Normal appearance.  ?HENT:  ?   Head: Normocephalic and atraumatic.  ?   Mouth/Throat:  ?   Pharynx: Oropharynx is clear.  ?Eyes:  ?   Pupils: Pupils are equal, round, and reactive to light.  ?Cardiovascular:  ?   Rate and Rhythm: Normal rate and regular rhythm.  ?Pulmonary:  ?   Effort: Pulmonary effort is normal.  ?   Breath sounds: Normal breath sounds.  ?Abdominal:  ?   General: Abdomen is flat.  ?   Palpations: Abdomen is soft.  ?Musculoskeletal:     ?   General: Normal range of motion.  ?Skin: ?   General: Skin is warm and dry.  ?Neurological:  ?   General: No focal deficit present.  ?   Mental Status: He is alert. Mental status is at baseline.  ?Psychiatric:     ?   Attention and Perception: Attention normal.     ?   Mood and Affect: Mood normal. Affect is blunt.     ?   Speech: Speech normal.     ?   Behavior: Behavior is cooperative.     ?   Thought Content: Thought content normal.     ?   Cognition and Memory: Cognition normal.  ? ?Review of Systems  ?Constitutional: Negative.   ?HENT: Negative.    ?Eyes: Negative.   ?Respiratory: Negative.    ?Cardiovascular:  Negative.   ?Gastrointestinal: Negative.   ?Musculoskeletal: Negative.   ?Skin: Negative.   ?Neurological: Negative.   ?Psychiatric/Behavioral:  Positive for depression. Negative for hallucinations, substance abuse and suic

## 2021-08-02 NOTE — Plan of Care (Signed)
D- Patient alert and oriented. Patient presents in a pleasant mood on assessment reporting that he slept "poor" last night due to the fact that he kept waking up throughout the night. Patient continues to endorse depression, anxiety, and hopelessness on his self-inventory. Patient states that this is just how he is. Patient also endorsed SI on his self-inventory, however, when this writer questioned him about this, he denied any self-harm thoughts. Patient denies HI/AVH, and pain at this time. Patient's goal for today is to "rest", in which "deep breathing", is what he'll do in order to achieve his goal. ? ?A- Scheduled medications administered to patient, per MD orders. Support and encouragement provided.  Routine safety checks conducted every 15 minutes.  Patient informed to notify staff with problems or concerns. ? ?R- No adverse drug reactions noted. Patient contracts for safety at this time. Patient compliant with medications and treatment plan. Patient receptive, calm, and cooperative. Patient interacts well with others on the unit.  Patient remains safe at this time. ? ?Problem: Education: ?Goal: Utilization of techniques to improve thought processes will improve ?Outcome: Progressing ?Goal: Knowledge of the prescribed therapeutic regimen will improve ?Outcome: Progressing ?  ?Problem: Activity: ?Goal: Interest or engagement in leisure activities will improve ?Outcome: Progressing ?Goal: Imbalance in normal sleep/wake cycle will improve ?Outcome: Progressing ?  ?Problem: Coping: ?Goal: Coping ability will improve ?Description: PT IS PROGRESSING. ?Outcome: Progressing ?Goal: Will verbalize feelings ?Outcome: Progressing ?  ?Problem: Health Behavior/Discharge Planning: ?Goal: Ability to make decisions will improve ?Outcome: Progressing ?Goal: Compliance with therapeutic regimen will improve ?Outcome: Progressing ?  ?Problem: Role Relationship: ?Goal: Will demonstrate positive changes in social behaviors and  relationships ?Outcome: Progressing ?  ?Problem: Safety: ?Goal: Ability to disclose and discuss suicidal ideas will improve ?Outcome: Progressing ?Goal: Ability to identify and utilize support systems that promote safety will improve ?Outcome: Progressing ?  ?Problem: Self-Concept: ?Goal: Will verbalize positive feelings about self ?Outcome: Progressing ?Goal: Level of anxiety will decrease ?Outcome: Progressing ?  ?

## 2021-08-02 NOTE — Plan of Care (Signed)
?  Problem: Education: ?Goal: Utilization of techniques to improve thought processes will improve ?Outcome: Progressing ?Goal: Knowledge of the prescribed therapeutic regimen will improve ?Outcome: Progressing ?  ?Problem: Activity: ?Goal: Interest or engagement in leisure activities will improve ?Outcome: Progressing ?Goal: Imbalance in normal sleep/wake cycle will improve ?Outcome: Progressing ?  ?Problem: Coping: ?Goal: Coping ability will improve ?Description: PT IS PROGRESSING. ?Outcome: Progressing ?Goal: Will verbalize feelings ?Outcome: Progressing ?  ?Problem: Health Behavior/Discharge Planning: ?Goal: Ability to make decisions will improve ?Outcome: Progressing ?Goal: Compliance with therapeutic regimen will improve ?Outcome: Progressing ?  ?

## 2021-08-03 MED ORDER — MIRTAZAPINE 45 MG PO TABS: 45.0000 mg | ORAL_TABLET | Freq: Every day | ORAL | 1 refills | Status: DC

## 2021-08-03 MED ORDER — MELATONIN 5 MG PO TABS: 5.0000 mg | ORAL_TABLET | Freq: Every day | ORAL | 1 refills | Status: DC

## 2021-08-03 MED ORDER — AMLODIPINE BESYLATE 10 MG PO TABS: 10.0000 mg | ORAL_TABLET | Freq: Every day | ORAL | 1 refills | Status: DC

## 2021-08-03 MED ORDER — LITHIUM CARBONATE ER 450 MG PO TBCR: 450.0000 mg | EXTENDED_RELEASE_TABLET | Freq: Two times a day (BID) | ORAL | 1 refills | Status: DC

## 2021-08-03 MED ORDER — VENLAFAXINE HCL ER 75 MG PO CP24: 225.0000 mg | ORAL_CAPSULE | Freq: Every day | ORAL | 1 refills | Status: DC

## 2021-08-03 MED ORDER — VITAMIN D (ERGOCALCIFEROL) 1.25 MG (50000 UNIT) PO CAPS: 50000.0000 [IU] | ORAL_CAPSULE | ORAL | 1 refills | Status: DC

## 2021-08-03 MED ORDER — QUETIAPINE FUMARATE 300 MG PO TABS: 300.0000 mg | ORAL_TABLET | Freq: Every day | ORAL | 1 refills | Status: DC

## 2021-08-03 NOTE — Progress Notes (Signed)
Patient denies SI,HI and AVH. Verbalized understanding discharge instructions, follow up care and prescriptions. 30 days medicines given to patient. All belongings returned from [employer]. Patient escorted out by staff and transported by cab. ?

## 2021-08-03 NOTE — Plan of Care (Signed)
?  Problem: Education: ?Goal: Utilization of techniques to improve thought processes will improve ?Outcome: Progressing ?Goal: Knowledge of the prescribed therapeutic regimen will improve ?Outcome: Progressing ?  ?Problem: Activity: ?Goal: Interest or engagement in leisure activities will improve ?Outcome: Progressing ?Goal: Imbalance in normal sleep/wake cycle will improve ?Outcome: Progressing ?  ?Problem: Coping: ?Goal: Coping ability will improve ?Description: PT IS PROGRESSING. ?Outcome: Progressing ?Goal: Will verbalize feelings ?Outcome: Progressing ?  ?Problem: Health Behavior/Discharge Planning: ?Goal: Ability to make decisions will improve ?Outcome: Progressing ?Goal: Compliance with therapeutic regimen will improve ?Outcome: Progressing ?  ?Problem: Self-Concept: ?Goal: Will verbalize positive feelings about self ?Outcome: Progressing ?Goal: Level of anxiety will decrease ?Outcome: Progressing ?  ?Problem: Safety: ?Goal: Ability to disclose and discuss suicidal ideas will improve ?Outcome: Progressing ?Goal: Ability to identify and utilize support systems that promote safety will improve ?Outcome: Progressing ?  ?

## 2021-08-03 NOTE — Progress Notes (Signed)
Recreation Therapy Notes ? ?INPATIENT RECREATION TR PLAN ? ?Patient Details ?Name: Casey Silva ?MRN: [id number] ?DOB: July 14, 1955 ?Today's Date: 08/03/2021 ? ?Rec Therapy Plan ?Is patient appropriate for Therapeutic Recreation?: Yes ?Treatment times per week: At least 3 ?Estimated Length of Stay: 5-7 days ?TR Treatment/Interventions: Group participation (Comment) ? ?Discharge Criteria ?Pt will be discharged from therapy if:: Discharged ?Treatment plan/goals/alternatives discussed and agreed upon by:: Patient/family ? ?Discharge Summary ?Short term goals set: Patient will engage in groups without prompting or encouragement from LRT x3 group sessions within 5 recreation therapy group sessions ?Short term goals met: Complete ?Progress toward goals comments: Groups attended ?Which groups?: Wellness, Coping skills, Stress management, AAA/T, Other (Comment) (Self-care,Relaxation, Time-Management) ?Reason goals not met: N/A ?Therapeutic equipment acquired: N/A ?Reason patient discharged from therapy: Discharge from hospital ?Pt/family agrees with progress & goals achieved: Yes ?Date patient discharged from therapy: 08/03/21 ? ? ?Georgann Bramble ?08/03/2021, 11:24 AM ?

## 2021-08-03 NOTE — Discharge Summary (Signed)
Physician Discharge Summary Note ? ?Patient:  Casey Silva is an 66 y.o., male ?MRN:  [id number] ?DOB:  [birth year] ?Patient phone:  [phone] (home)  ?Patient address:   ?[address] ?[address] ?[place] [address],  ?Total Time spent with patient: 30 minutes ? ?Date of Admission:  06/23/2021 ?Date of Discharge: 08/03/2021 ? ?Reason for Admission: Patient was transferred to our hospital from [facility] for continued treatment of severe depression with the expectation of ECT treatment. ? ?Principal Problem: Severe recurrent major depression with psychotic features ([place]) ?Discharge Diagnoses: Principal Problem: ?  Severe recurrent major depression with psychotic features ([place]) ?Active Problems: ?  Primary insomnia ?  GAD (generalized anxiety disorder) ?  Essential hypertension ? ? ?Past Psychiatric History: History of recurrent depression with possible spells of agitation and anxiety could represent bipolar disorder ? ?Past Medical History:  ?Past Medical History:  ?Diagnosis Date  ? Anxiety   ? Asthma   ? Depression   ? History reviewed. No pertinent surgical history. ?Family History:  ?Family History  ?Problem Relation Age of Onset  ? Bipolar disorder Mother   ? ?Family Psychiatric  History: See previous.  Has a family history of bipolar disorder in his mother ?Social History:  ?Social History  ? ?Substance and Sexual Activity  ?Alcohol Use No  ?   ?Social History  ? ?Substance and Sexual Activity  ?Drug Use No  ?  ?Social History  ? ?Socioeconomic History  ? Marital status: Single  ?  Spouse name: Not on file  ? Number of children: 1  ? Years of education: Not on file  ? Highest education level: GED or equivalent  ?Occupational History  ? Not on file  ?Tobacco Use  ? Smoking status: Never  ? Smokeless tobacco: Never  ?Vaping Use  ? Vaping Use: Unknown  ?Substance and Sexual Activity  ? Alcohol use: No  ? Drug use: No  ? Sexual activity: Not Currently  ?Other Topics Concern  ? Not on file  ?Social History  Narrative  ? Marital status:  Single   Children: Daughter   Employment:  Unemployment. previous work for [employer].   Alcohol:  None   Drugs:  none   Exercise:  Regularly very   Religion:  Jewish  ? ?Social Determinants of Health  ? ?Financial Resource Strain: Not on file  ?Food Insecurity: Not on file  ?Transportation Needs: Not on file  ?Physical Activity: Not on file  ?Stress: Not on file  ?Social Connections: Not on file  ? ? ?Hospital Course: Admitted to the psychiatric unit.  15-minute checks maintained.  Patient agreed to the proposal of electroconvulsive therapy as treatment for his major depression.  Once arrangements were made he was started in treatment and ultimately received 10 treatments of bilateral ECT.  The treatments were tolerated well with minimal side effects.  We struggled early on to get adequate length seizures but by the end were getting consistent adequate length seizures.  He showed partial improvement and then plateaued without further benefit once we got up around 9 or 10.  While he was in the hospital he did not display any suicidal or dangerous behavior.  No attempts at self-harm.  He did frequently report still feeling depressed and would have passive suicidal thoughts without any specific plan.  Along with ECT he was treated with medication management with Effexor and mirtazapine ultimately with the addition of Seroquel titrated up to 300 mg for bipolar depression and finally the addition  of lithium now at 450 mg twice a day for treatment of suicidal ideation and major depression and possible bipolar depression.  He is tolerating medicine well.  Sleeps adequately at night.  Stays in bed much of the day but is not asleep and is easily awakable  At the day of discharge patient is not reporting active suicidal ideation.  Still blunted and a little bit slowed.  We reviewed with him his medications and I reviewed with him that he will be referred for outpatient treatment in  [facility] and that he is strongly advised to follow up with that treatment and not let himself get back into a rut of staying in bed all the time.  I advised him that it would be my suggestion he tried to get back to work since that had provided a social outlet and a sense of positive self-esteem for him in the past.  Case has been reviewed with treatment team will understand that this is a plan to discharge.  Partial improvement in depression but at this point not likely to benefit from further inpatient stay. ? ?Physical Findings: ?AIMS:  , ,  ,  ,    ?CIWA:    ?COWS:    ? ?Musculoskeletal: ?Strength & Muscle Tone: within normal limits ?Gait & Station: normal ?Patient leans: N/A ? ? ?Psychiatric Specialty Exam: ? ?Presentation  ?General Appearance: Appropriate for Environment; Bizarre; Disheveled; Fairly Groomed ? ?Eye Contact:Good ? ?Speech:Clear and Coherent; Normal Rate ? ?Speech Volume:Normal ? ?Handedness:Right ? ? ?Mood and Affect  ?Mood:Anxious; Depressed; Hopeless; Irritable ? ?Affect:Blunt; Depressed ? ? ?Thought Process  ?Thought Processes:Coherent; Linear ? ?Descriptions of Associations:Intact ? ?Orientation:Full (Time, Place and Person) ? ?Thought Content:Logical; WDL ? ?History of Schizophrenia/Schizoaffective disorder:No ? ?Duration of Psychotic Symptoms:N/A ? ?Hallucinations:No data recorded ?Ideas of Reference:None ? ?Suicidal Thoughts:No data recorded ?Homicidal Thoughts:No data recorded ? ?Sensorium  ?Memory:Immediate Fair; Recent Fair; Remote Fair ? ?Judgment:Fair ? ?Insight:Fair ? ? ?Executive Functions  ?Concentration:Fair ? ?Attention Span:Good ? ?Recall:Good ? ?[place] ? ?Language:Good ? ? ?Psychomotor Activity  ?Psychomotor Activity:No data recorded ? ?Assets  ?Assets:Communication Skills; Physical Health; Social Support ? ? ?Sleep  ?Sleep:No data recorded ? ? ?Physical Exam: ?Physical Exam ?Vitals and nursing note reviewed.  ?Constitutional:   ?   Appearance: Normal  appearance.  ?HENT:  ?   Head: Normocephalic and atraumatic.  ?   Mouth/Throat:  ?   Pharynx: Oropharynx is clear.  ?Eyes:  ?   Pupils: Pupils are equal, round, and reactive to light.  ?Cardiovascular:  ?   Rate and Rhythm: Normal rate and regular rhythm.  ?Pulmonary:  ?   Effort: Pulmonary effort is normal.  ?   Breath sounds: Normal breath sounds.  ?Abdominal:  ?   General: Abdomen is flat.  ?   Palpations: Abdomen is soft.  ?Musculoskeletal:     ?   General: Normal range of motion.  ?Skin: ?   General: Skin is warm and dry.  ?Neurological:  ?   General: No focal deficit present.  ?   Mental Status: He is alert. Mental status is at baseline.  ?Psychiatric:     ?   Attention and Perception: Attention normal.     ?   Mood and Affect: Mood normal. Affect is blunt.     ?   Speech: Speech normal.     ?   Behavior: Behavior is cooperative.     ?   Thought Content: Thought content normal.     ?  Cognition and Memory: Cognition normal.     ?   Judgment: Judgment normal.  ? ?Review of Systems  ?Constitutional: Negative.   ?HENT: Negative.    ?Eyes: Negative.   ?Respiratory: Negative.    ?Cardiovascular: Negative.   ?Gastrointestinal: Negative.   ?Musculoskeletal: Negative.   ?Skin: Negative.   ?Neurological: Negative.   ?Psychiatric/Behavioral:  Positive for depression. Negative for hallucinations, memory loss, substance abuse and suicidal ideas. The patient is not nervous/anxious and does not have insomnia.   ?Blood pressure 107/81, pulse 72, temperature 98.4 ?F (36.9 ?C), temperature source Oral, resp. rate 18, height '5\' 6"'$  (1.676 m), weight 76.2 kg, SpO2 99 %. Body mass index is 27.12 kg/m?. ? ? ?Social History  ? ?Tobacco Use  ?Smoking Status Never  ?Smokeless Tobacco Never  ? ?Tobacco Cessation:  N/A, patient does not currently use tobacco products ? ? ?Blood Alcohol level:  ?Lab Results  ?Component Value Date  ? ETH <10 06/07/2021  ? ETH <10 11/22/2020  ? ? ?Metabolic Disorder Labs:  ?Lab Results  ?Component Value  Date  ? HGBA1C 5.1 06/07/2021  ? MPG 99.67 06/07/2021  ? MPG 111.15 11/24/2020  ? ?No results found for: PROLACTIN ?Lab Results  ?Component Value Date  ? CHOL 197 06/07/2021  ? TRIG 149 06/07/2021  ? HDL 44 06/07/2021

## 2021-08-03 NOTE — Progress Notes (Signed)
Recreation Therapy Notes ? ?Date: 08/03/2021 ?  ?Time: 11:00 am  ?  ?Location: Craft room   ?  ?Behavioral response: Appropriate ?  ?Intervention Topic: Time Management   ?  ?Discussion/Intervention:  ?Patient refused to attend group. ? ?Clinical Observations/Feedback: ?Patient refused to attend group.  ?[name] LRT/CTRS ? ? ? ? ? ? ? ?Casey Silva ?08/03/2021 11:17 AM ?

## 2021-08-03 NOTE — BH IP Treatment Plan (Signed)
Interdisciplinary Treatment and Diagnostic Plan Update ? ?08/03/2021 ?Time of Session: 8:30AM ?Casey Silva ?MRN: [id number] ? ?Principal Diagnosis: Severe recurrent major depression with psychotic features ([place]) ? ?Secondary Diagnoses: Principal Problem: ?  Severe recurrent major depression with psychotic features ([place]) ?Active Problems: ?  Primary insomnia ?  GAD (generalized anxiety disorder) ?  Essential hypertension ? ? ?Current Medications:  ?Current Facility-Administered Medications  ?Medication Dose Route Frequency Provider Last Rate Last Admin  ? acetaminophen (TYLENOL) tablet 650 mg  650 mg Oral Once PRN Darrin Nipper, MD      ? Or  ? acetaminophen (TYLENOL) 160 MG/5ML solution 325-650 mg  325-650 mg Oral Q4H PRN Darrin Nipper, MD      ? amLODipine (NORVASC) tablet 10 mg  10 mg Oral Daily Clapacs, [name], MD   10 mg at 08/03/21 0757  ? diclofenac Sodium (VOLTAREN) 1 % topical gel 2 g  2 g Topical Q12H PRN Clapacs, [name], MD      ? famotidine (PEPCID) tablet 20 mg  20 mg Oral BID Clapacs, [name], MD   20 mg at 08/03/21 0757  ? feeding supplement (ENSURE ENLIVE / ENSURE PLUS) liquid 237 mL  237 mL Oral BID BM Clapacs, [name], MD   237 mL at 08/02/21 1549  ? hydrOXYzine (ATARAX) tablet 50 mg  50 mg Oral Q6H PRN Clapacs, [name], MD   50 mg at 08/01/21 2106  ? ibuprofen (ADVIL) tablet 600 mg  600 mg Oral Q6H PRN Clapacs, [name], MD   600 mg at 06/27/21 0915  ? lithium carbonate (ESKALITH) CR tablet 450 mg  450 mg Oral Q12H Clapacs, [name], MD   450 mg at 08/03/21 0757  ? loperamide (IMODIUM) capsule 2 mg  2 mg Oral PRN Larita Fife, MD   2 mg at 07/22/21 0858  ? melatonin tablet 5 mg  5 mg Oral QHS Clapacs, [name], MD   5 mg at 08/02/21 2057  ? mirtazapine (REMERON) tablet 45 mg  45 mg Oral QHS Clapacs, [name], MD   45 mg at 08/02/21 2057  ? multivitamin with minerals tablet 1 tablet  1 tablet Oral Daily Clapacs, [name], MD   1 tablet at 08/03/21 0756  ? ondansetron (ZOFRAN-ODT) disintegrating tablet 4 mg  4  mg Oral Q4H PRN Clapacs, [name], MD      ? QUEtiapine (SEROQUEL) tablet 300 mg  300 mg Oral QHS Clapacs, [name], MD   300 mg at 08/02/21 2057  ? traZODone (DESYREL) tablet 100 mg  100 mg Oral QHS PRN Clapacs, [name], MD   100 mg at 08/02/21 2057  ? venlafaxine XR (EFFEXOR-XR) 24 hr capsule 225 mg  225 mg Oral Q breakfast Clapacs, [name], MD   225 mg at 08/03/21 0756  ? Vitamin D (Ergocalciferol) (DRISDOL) capsule 50,000 Units  50,000 Units Oral Q7 days Clapacs, [name], MD   50,000 Units at 07/30/21 1707  ? ?PTA Medications: ?Medications Prior to Admission  ?Medication Sig Dispense Refill Last Dose  ? amLODipine (NORVASC) 10 MG tablet Take 1 tablet (10 mg total) by mouth daily. 30 tablet 0 07/03/2021  ? gabapentin (NEURONTIN) 400 MG capsule Take 1 capsule (400 mg total) by mouth 3 (three) times daily. 90 capsule 0 07/03/2021  ? LORazepam (ATIVAN) 0.5 MG tablet Take 1 tablet (0.5 mg total) by mouth 2 (two) times daily as needed for anxiety. 30 tablet 0 07/03/2021  ? melatonin 5 MG TABS Take  1 tablet (5 mg total) by mouth at bedtime. 30 tablet 0 07/03/2021  ? mirtazapine (REMERON) 30 MG tablet Take 1 tablet (30 mg total) by mouth at bedtime. 30 tablet 3 07/03/2021  ? propranolol (INDERAL) 10 MG tablet Take 1 tablet (10 mg total) by mouth 3 (three) times daily. 90 tablet 0 07/03/2021  ? QUEtiapine (SEROQUEL) 100 MG tablet Take 1 tablet (100 mg total) by mouth at bedtime. 30 tablet 0 07/03/2021  ? QUEtiapine (SEROQUEL) 25 MG tablet Take 1 tablet (25 mg total) by mouth 2 (two) times daily. 60 tablet 0 07/03/2021  ? traZODone (DESYREL) 100 MG tablet Take 1 tablet (100 mg total) by mouth at bedtime. 30 tablet 0 07/03/2021  ? venlafaxine XR (EFFEXOR-XR) 37.5 MG 24 hr capsule Take 3 capsules (112.5 mg total) by mouth daily with breakfast. 30 capsule 0 07/03/2021  ? [EXPIRED] Vitamin D, Ergocalciferol, (DRISDOL) 1.25 MG (50000 UNIT) CAPS capsule Take 1 capsule (50,000 Units total) by mouth every 7 (seven) days. 5 capsule 0 07/03/2021   ? ? ?Patient Stressors: Loss of connection with work, loss of sense of purpose   ?Other: loss of mental health   ? ?Patient Strengths: Ability for insight  ?Communication skills  ?General fund of knowledge  ?Motivation for treatment/growth  ? ?Treatment Modalities: Medication Management, Group therapy, Case management,  ?1 to 1 session with clinician, Psychoeducation, Recreational therapy. ? ? ?Physician Treatment Plan for Primary Diagnosis: Severe recurrent major depression with psychotic features ([place]) ?Long Term Goal(s): Improvement in symptoms so as ready for discharge  ? ?Short Term Goals: Ability to maintain clinical measurements within normal limits will improve ?Compliance with prescribed medications will improve ?Ability to verbalize feelings will improve ?Ability to disclose and discuss suicidal ideas ?Ability to demonstrate self-control will improve ? ?Medication Management: Evaluate patient's response, side effects, and tolerance of medication regimen. ? ?Therapeutic Interventions: 1 to 1 sessions, Unit Group sessions and Medication administration. ? ?Evaluation of Outcomes: Adequate for Discharge ? ?Physician Treatment Plan for Secondary Diagnosis: Principal Problem: ?  Severe recurrent major depression with psychotic features ([place]) ?Active Problems: ?  Primary insomnia ?  GAD (generalized anxiety disorder) ?  Essential hypertension ? ?Long Term Goal(s): Improvement in symptoms so as ready for discharge  ? ?Short Term Goals: Ability to maintain clinical measurements within normal limits will improve ?Compliance with prescribed medications will improve ?Ability to verbalize feelings will improve ?Ability to disclose and discuss suicidal ideas ?Ability to demonstrate self-control will improve    ? ?Medication Management: Evaluate patient's response, side effects, and tolerance of medication regimen. ? ?Therapeutic Interventions: 1 to 1 sessions, Unit Group sessions and Medication  administration. ? ?Evaluation of Outcomes: Adequate for Discharge ? ? ?RN Treatment Plan for Primary Diagnosis: Severe recurrent major depression with psychotic features ([place]) ?Long Term Goal(s): Knowledge of disease and therapeutic regimen to maintain health will improve ? ?Short Term Goals: Ability to demonstrate self-control, Ability to participate in decision making will improve, Ability to verbalize feelings will improve, Ability to disclose and discuss suicidal ideas, Ability to identify and develop effective coping behaviors will improve, and Compliance with prescribed medications will improve ? ?Medication Management: RN will administer medications as ordered by provider, will assess and evaluate patient's response and provide education to patient for prescribed medication. RN will report any adverse and/or side effects to prescribing provider. ? ?Therapeutic Interventions: 1 on 1 counseling sessions, Psychoeducation, Medication administration, Evaluate responses to treatment, Monitor vital signs and CBGs as ordered, Perform/monitor CIWA, COWS,  AIMS and Fall Risk screenings as ordered, Perform wound care treatments as ordered. ? ?Evaluation of Outcomes: Adequate for Discharge ? ? ?LCSW Treatment Plan for Primary Diagnosis: Severe recurrent major depression with psychotic features ([place]) ?Long Term Goal(s): Safe transition to appropriate next level of care at discharge, Engage patient in therapeutic group addressing interpersonal concerns. ? ?Short Term Goals: Engage patient in aftercare planning with referrals and resources, Increase social support, Increase ability to appropriately verbalize feelings, Increase emotional regulation, Facilitate acceptance of mental health diagnosis and concerns, and Increase skills for wellness and recovery ? ?Therapeutic Interventions: Assess for all discharge needs, 1 to 1 time with Education officer, museum, Explore available resources and support systems, Assess for adequacy in  community support network, Educate family and significant other(s) on suicide prevention, Complete Psychosocial Assessment, Interpersonal group therapy. ? ?Evaluation of Outcomes: Adequate for Discharge ? ? ?Progress in Treatment: ?A

## 2021-08-03 NOTE — BHH Suicide Risk Assessment (Signed)
[facility] Discharge Suicide Risk Assessment ? ? ?Principal Problem: Severe recurrent major depression with psychotic features ([place]) ?Discharge Diagnoses: Principal Problem: ?  Severe recurrent major depression with psychotic features ([place]) ?Active Problems: ?  Primary insomnia ?  GAD (generalized anxiety disorder) ?  Essential hypertension ? ? ?Total Time spent with patient: 30 minutes ? ?Musculoskeletal: ?Strength & Muscle Tone: within normal limits ?Gait & Station: normal ?Patient leans: N/A ? ?Psychiatric Specialty Exam ? ?Presentation  ?General Appearance: Appropriate for Environment; Bizarre; Disheveled; Fairly Groomed ? ?Eye Contact:Good ? ?Speech:Clear and Coherent; Normal Rate ? ?Speech Volume:Normal ? ?Handedness:Right ? ? ?Mood and Affect  ?Mood:Anxious; Depressed; Hopeless; Irritable ? ?Duration of Depression Symptoms: Greater than two weeks ? ?Affect:Blunt; Depressed ? ? ?Thought Process  ?Thought Processes:Coherent; Linear ? ?Descriptions of Associations:Intact ? ?Orientation:Full (Time, Place and Person) ? ?Thought Content:Logical; WDL ? ?History of Schizophrenia/Schizoaffective disorder:No ? ?Duration of Psychotic Symptoms:N/A ? ?Hallucinations:No data recorded ?Ideas of Reference:None ? ?Suicidal Thoughts:No data recorded ?Homicidal Thoughts:No data recorded ? ?Sensorium  ?Memory:Immediate Fair; Recent Fair; Remote Fair ? ?Judgment:Fair ? ?Insight:Fair ? ? ?Executive Functions  ?Concentration:Fair ? ?Attention Span:Good ? ?Recall:Good ? ?[place] ? ?Language:Good ? ? ?Psychomotor Activity  ?Psychomotor Activity:No data recorded ? ?Assets  ?Assets:Communication Skills; Physical Health; Social Support ? ? ?Sleep  ?Sleep:No data recorded ? ?Physical Exam: ?Physical Exam ?Vitals and nursing note reviewed.  ?Constitutional:   ?   Appearance: Normal appearance.  ?HENT:  ?   Head: Normocephalic and atraumatic.  ?   Mouth/Throat:  ?   Pharynx: Oropharynx is clear.  ?Eyes:  ?   Pupils: Pupils are  equal, round, and reactive to light.  ?Cardiovascular:  ?   Rate and Rhythm: Normal rate and regular rhythm.  ?Pulmonary:  ?   Effort: Pulmonary effort is normal.  ?   Breath sounds: Normal breath sounds.  ?Abdominal:  ?   General: Abdomen is flat.  ?   Palpations: Abdomen is soft.  ?Musculoskeletal:     ?   General: Normal range of motion.  ?Skin: ?   General: Skin is warm and dry.  ?Neurological:  ?   General: No focal deficit present.  ?   Mental Status: He is alert. Mental status is at baseline.  ?Psychiatric:     ?   Attention and Perception: Attention normal.     ?   Mood and Affect: Mood normal. Affect is blunt.     ?   Speech: Speech normal.     ?   Behavior: Behavior is cooperative.     ?   Thought Content: Thought content normal.     ?   Cognition and Memory: Cognition normal.     ?   Judgment: Judgment normal.  ? ?Review of Systems  ?Constitutional: Negative.   ?HENT: Negative.    ?Eyes: Negative.   ?Respiratory: Negative.    ?Cardiovascular: Negative.   ?Gastrointestinal: Negative.   ?Musculoskeletal: Negative.   ?Skin: Negative.   ?Neurological: Negative.   ?Psychiatric/Behavioral:  Negative for depression, hallucinations, memory loss, substance abuse and suicidal ideas. The patient is not nervous/anxious and does not have insomnia.   ?Blood pressure 107/81, pulse 72, temperature 98.4 ?F (36.9 ?C), temperature source Oral, resp. rate 18, height '5\' 6"'$  (1.676 m), weight 76.2 kg, SpO2 99 %. Body mass index is 27.12 kg/m?. ? ?Mental Status Per Nursing Assessment::   ?On Admission:  Suicidal ideation indicated by patient, Self-harm thoughts ? ?Demographic Factors:  ?Male, Living alone, and  Unemployed ? ?Loss Factors: ?Decrease in vocational status ? ?Historical Factors: ?Family history of mental illness or substance abuse ? ?Risk Reduction Factors:   ?Positive therapeutic relationship ? ?Continued Clinical Symptoms:  ?Bipolar Disorder:   Depressive phase ?Depression:   Hopelessness ? ?Cognitive Features That  Contribute To Risk:  ?None   ? ?Suicide Risk:  ?Mild:  Suicidal ideation of limited frequency, intensity, duration, and specificity.  There are no identifiable plans, no associated intent, mild dysphoria and related symptoms, good self-control (both objective and subjective assessment), few other risk factors, and identifiable protective factors, including available and accessible social support. ? ? ? ?Plan Of Care/Follow-up recommendations:  ?Patient has been stable with symptoms and behavior for many days.  Continues to have blunted affect and some dysphoria.  No active suicidal intent or behavior.  No evidence of current psychosis.  He has been eating adequately and is able to get up and interact with others.  Agrees to continue medication and continue recommended outpatient treatment in the community.  Prescriptions and supply of medicine given at discharge.  Follow-up with behavioral health treatment in [place] ? ?Alethia Berthold, MD ?08/03/2021, 9:51 AM ?

## 2021-08-03 NOTE — Progress Notes (Signed)
?  [facility] Adult Case Management Discharge Plan : ? ?Will you be returning to the same living situation after discharge:  Yes,  pt reports that he is returning home. ?At discharge, do you have transportation home?: Yes,  CSW to assist with transportation needs.  ?Do you have the ability to pay for your medications: No. ? ?Release of information consent forms completed and in the chart;  Patient's signature needed at discharge. ? ?Patient to Follow up at: ? Follow-up Information   ? ? [facility]. Go on 08/22/2021.   ?Specialty: Urgent Care ?Why: Please present for scheduled medication managment appointment on 5/15 at 4:30 VIRTUAL. ?Contact information: ?[address] ?[place] ?[phone] ? ?  ?  ? ?  ?  ? ?  ? ? ?Next level of care provider has access to [facility] ? ?Safety Planning and Suicide Prevention discussed: Yes,  SPE completed with the patient.  ? ?  ? ?Has patient been referred to the Quitline?: N/A patient is not a smoker ? ?Patient has been referred for addiction treatment: Pt. refused referral ? ?Rozann Lesches, LCSW ?08/03/2021, 12:22 PM ?

## 2021-08-03 NOTE — Progress Notes (Signed)
Patient was isolative to his room. Came out for snack and watched beginning of NBA playoff game while eating snack.  He reported having a decent day today and denies si hi avh anxiety and pain at this encounter.  He continues to endorse depression, but says he is feeling better. He is med compliant this evening and received his QHS meds without incident. Will continue to monitor with q15 minute safety rounds. Support and encouragement provided while discussing his discharge for tomorrow. ? ? ? ? ?C Butler-Nicholson, LPN ?

## 2021-08-22 ENCOUNTER — Telehealth (HOSPITAL_COMMUNITY): Payer: 59 | Admitting: Psychiatry

## 2021-09-10 ENCOUNTER — Encounter (HOSPITAL_COMMUNITY): Payer: Self-pay

## 2021-09-10 ENCOUNTER — Other Ambulatory Visit (HOSPITAL_COMMUNITY)
Admission: EM | Admit: 2021-09-10 | Discharge: 2021-09-10 | Disposition: A | Discharge status: Still In Hospital | Payer: No Payment, Other | Attending: Psychiatry | Admitting: Psychiatry

## 2021-09-10 ENCOUNTER — Emergency Department (HOSPITAL_COMMUNITY)
Admission: EM | Admit: 2021-09-10 | Discharge: 2021-09-10 | Payer: Self-pay | Attending: Emergency Medicine | Admitting: Emergency Medicine

## 2021-09-10 ENCOUNTER — Other Ambulatory Visit: Payer: Self-pay

## 2021-09-10 ENCOUNTER — Other Ambulatory Visit (HOSPITAL_COMMUNITY)
Admission: EM | Admit: 2021-09-10 | Discharge: 2021-09-13 | Disposition: A | Discharge status: Other Acute Inpatient Hospital | Payer: No Payment, Other | Attending: Psychiatry | Admitting: Psychiatry

## 2021-09-10 DIAGNOSIS — F411 Generalized anxiety disorder: Secondary | ICD-10-CM | POA: Insufficient documentation

## 2021-09-10 DIAGNOSIS — F333 Major depressive disorder, recurrent, severe with psychotic symptoms: Secondary | ICD-10-CM | POA: Diagnosis present

## 2021-09-10 DIAGNOSIS — R45851 Suicidal ideations: Secondary | ICD-10-CM | POA: Insufficient documentation

## 2021-09-10 DIAGNOSIS — I10 Essential (primary) hypertension: Secondary | ICD-10-CM | POA: Insufficient documentation

## 2021-09-10 DIAGNOSIS — Z20822 Contact with and (suspected) exposure to covid-19: Secondary | ICD-10-CM | POA: Insufficient documentation

## 2021-09-10 DIAGNOSIS — M545 Low back pain, unspecified: Secondary | ICD-10-CM | POA: Insufficient documentation

## 2021-09-10 DIAGNOSIS — F331 Major depressive disorder, recurrent, moderate: Secondary | ICD-10-CM | POA: Diagnosis not present

## 2021-09-10 DIAGNOSIS — M549 Dorsalgia, unspecified: Secondary | ICD-10-CM | POA: Insufficient documentation

## 2021-09-10 LAB — CBC WITH DIFFERENTIAL/PLATELET
Abs Immature Granulocytes: 0.02 10*3/uL (ref 0.00–0.07)
Basophils Absolute: 0.1 10*3/uL (ref 0.0–0.1)
Basophils Relative: 1 %
Eosinophils Absolute: 0.3 10*3/uL (ref 0.0–0.5)
Eosinophils Relative: 5 %
HCT: 42.1 % (ref 39.0–52.0)
Hemoglobin: 14.5 g/dL (ref 13.0–17.0)
Immature Granulocytes: 0 %
Lymphocytes Relative: 32 %
Lymphs Abs: 1.9 10*3/uL (ref 0.7–4.0)
MCH: 30.1 pg (ref 26.0–34.0)
MCHC: 34.4 g/dL (ref 30.0–36.0)
MCV: 87.5 fL (ref 80.0–100.0)
Monocytes Absolute: 0.5 10*3/uL (ref 0.1–1.0)
Monocytes Relative: 8 %
Neutro Abs: 3.3 10*3/uL (ref 1.7–7.7)
Neutrophils Relative %: 54 %
Platelets: 275 10*3/uL (ref 150–400)
RBC: 4.81 MIL/uL (ref 4.22–5.81)
RDW: 14.7 % (ref 11.5–15.5)
WBC: 6.1 10*3/uL (ref 4.0–10.5)
nRBC: 0 % (ref 0.0–0.2)

## 2021-09-10 LAB — COMPREHENSIVE METABOLIC PANEL
ALT: 14 U/L (ref 0–44)
AST: 16 U/L (ref 15–41)
Albumin: 4 g/dL (ref 3.5–5.0)
Alkaline Phosphatase: 57 U/L (ref 38–126)
Anion gap: 11 (ref 5–15)
BUN: 15 mg/dL (ref 8–23)
CO2: 23 mmol/L (ref 22–32)
Calcium: 8.9 mg/dL (ref 8.9–10.3)
Chloride: 103 mmol/L (ref 98–111)
Creatinine, Ser: 1.04 mg/dL (ref 0.61–1.24)
GFR, Estimated: >60 mL/min (ref >60)
Glucose, Bld: 119 mg/dL — ABNORMAL HIGH (ref 70–99)
Potassium: 3.1 mmol/L — ABNORMAL LOW (ref 3.5–5.1)
Sodium: 137 mmol/L (ref 135–145)
Total Bilirubin: 0.4 mg/dL (ref 0.3–1.2)
Total Protein: 6.9 g/dL (ref 6.5–8.1)

## 2021-09-10 LAB — URINALYSIS, ROUTINE W REFLEX MICROSCOPIC
Bilirubin Urine: NEGATIVE
Glucose, UA: NEGATIVE mg/dL
Ketones, ur: NEGATIVE mg/dL
Leukocytes,Ua: NEGATIVE
Nitrite: NEGATIVE
Protein, ur: NEGATIVE mg/dL
Specific Gravity, Urine: 1.008 (ref 1.005–1.030)
pH: 5 (ref 5.0–8.0)

## 2021-09-10 LAB — RESP PANEL BY RT-PCR (FLU A&B, COVID) ARPGX2
Influenza A by PCR: NEGATIVE
Influenza B by PCR: NEGATIVE
SARS Coronavirus 2 by RT PCR: NEGATIVE

## 2021-09-10 LAB — LITHIUM LEVEL: Lithium Lvl: <0.06 mmol/L — ABNORMAL LOW (ref 0.60–1.20)

## 2021-09-10 LAB — TSH: TSH: 1.983 u[IU]/mL (ref 0.350–4.500)

## 2021-09-10 MED ORDER — MIRTAZAPINE 7.5 MG PO TABS: 7.5000 mg | ORAL_TABLET | Freq: Every day | ORAL | Status: DC

## 2021-09-10 MED ORDER — LIDOCAINE 5 % EX PTCH
2.0000 | MEDICATED_PATCH | Freq: Once | CUTANEOUS | Status: DC
  Administered 2021-09-10: 2 via TRANSDERMAL
  Filled 2021-09-10: qty 2

## 2021-09-10 MED ORDER — MIRTAZAPINE 15 MG PO TABS: 45.0000 mg | ORAL_TABLET | Freq: Every day | ORAL | Status: DC

## 2021-09-10 MED ORDER — MAGNESIUM HYDROXIDE 400 MG/5ML PO SUSP: 30.0000 mL | Freq: Every day | ORAL | Status: DC | PRN

## 2021-09-10 MED ORDER — FAMOTIDINE 20 MG PO TABS
20.0000 mg | ORAL_TABLET | Freq: Two times a day (BID) | ORAL | Status: DC
  Administered 2021-09-10: 20 mg via ORAL
  Filled 2021-09-10: qty 1

## 2021-09-10 MED ORDER — HYDROXYZINE HCL 25 MG PO TABS: 25.0000 mg | ORAL_TABLET | Freq: Three times a day (TID) | ORAL | Status: DC | PRN

## 2021-09-10 MED ORDER — LITHIUM CARBONATE ER 450 MG PO TBCR: 450.0000 mg | EXTENDED_RELEASE_TABLET | Freq: Once | ORAL | Status: DC

## 2021-09-10 MED ORDER — METHOCARBAMOL 500 MG PO TABS
500.0000 mg | ORAL_TABLET | Freq: Once | ORAL | Status: AC
  Administered 2021-09-10: 500 mg via ORAL
  Filled 2021-09-10: qty 1

## 2021-09-10 MED ORDER — ALUM & MAG HYDROXIDE-SIMETH 200-200-20 MG/5ML PO SUSP: 30.0000 mL | ORAL | Status: DC | PRN

## 2021-09-10 MED ORDER — QUETIAPINE FUMARATE 100 MG PO TABS: 100.0000 mg | ORAL_TABLET | Freq: Every day | ORAL | Status: DC

## 2021-09-10 MED ORDER — QUETIAPINE FUMARATE 300 MG PO TABS: 300.0000 mg | ORAL_TABLET | Freq: Every day | ORAL | Status: DC

## 2021-09-10 MED ORDER — ACETAMINOPHEN 325 MG PO TABS: 650.0000 mg | ORAL_TABLET | Freq: Four times a day (QID) | ORAL | Status: DC | PRN

## 2021-09-10 MED ORDER — LITHIUM CARBONATE ER 450 MG PO TBCR: 450.0000 mg | EXTENDED_RELEASE_TABLET | Freq: Two times a day (BID) | ORAL | Status: DC

## 2021-09-10 MED ORDER — METHOCARBAMOL 500 MG PO TABS: 500.0000 mg | ORAL_TABLET | Freq: Two times a day (BID) | ORAL | 0 refills | Status: DC

## 2021-09-10 MED ORDER — VENLAFAXINE HCL ER 75 MG PO CP24: 225.0000 mg | ORAL_CAPSULE | Freq: Every day | ORAL | Status: DC

## 2021-09-10 MED ORDER — TRAZODONE HCL 50 MG PO TABS: 50.0000 mg | ORAL_TABLET | Freq: Every evening | ORAL | Status: DC | PRN

## 2021-09-10 MED ORDER — KETOROLAC TROMETHAMINE 60 MG/2ML IM SOLN
30.0000 mg | Freq: Once | INTRAMUSCULAR | Status: AC
  Administered 2021-09-10: 30 mg via INTRAMUSCULAR
  Filled 2021-09-10: qty 2

## 2021-09-10 MED ORDER — AMLODIPINE BESYLATE 10 MG PO TABS
10.0000 mg | ORAL_TABLET | Freq: Every day | ORAL | Status: DC
  Administered 2021-09-10: 10 mg via ORAL
  Filled 2021-09-10: qty 1

## 2021-09-10 NOTE — ED Notes (Signed)
Bladder scan resulted in 68 ml

## 2021-09-10 NOTE — ED Notes (Signed)
Patient complained of lower back pain prior to be transferred to [facility]. Patient reported he was having lower back pain. Patient reported his initial onset of pain was approximately a week ago. Patient reported when he urinates " it is only a trickle no matter how much he drinks". Patient reports he is drinking a lot. Provider was notified and patient will be transported and escorted to [facility] for medical clearance by safe transport.

## 2021-09-10 NOTE — Discharge Instructions (Addendum)
Your Urine did not show signs of infection and your bladder scan did not show urinary retention. I think you likely have a muscle strain in your back that will improved with time and rest. Alternate Tylenol and Motrin every 3-4 hours as needed for pain. You can also take Robaxin, 1 tablet, twice a day for up to 5 days.

## 2021-09-10 NOTE — ED Notes (Signed)
DC report given to [facility] RN. Assisted to safe transport vehicle without incident.

## 2021-09-10 NOTE — ED Provider Notes (Addendum)
Facility Based Crisis Admission H&P  Date: 09/10/21 Patient Name: Casey Silva MRN: [id number] Chief Complaint: depression and suicidal      Diagnoses:  Final diagnoses:  MDD (major depressive disorder), recurrent episode, moderate ([place])    HPI: Casey Silva patient presented to [facility] as a walk in with complaints of chronic suicidal ideations and depressed mood. He is denying plan or intent. Stated " this pain in my lower back is not allowing me to care for myself, I cant even cut my own hair."  Stated that he has been off his medication for the past 4 to 5 days and hasn't made a follow-up appointment, because his provider has been out on leave.   He reported that he is experiencing feelings of helplessness, depression and anhedonia. Reported he has had ECT in the past  which is contributing to his  confusion.   Casey Silva, 66 y.o., male patient seen face to face by this provider, and chart reviewed on 09/10/21.  On evaluation Casey Silva. He denied illicit drug use or substance abuse history.    During evaluation Casey Silva is sitting in no acute distress.He is alert/oriented x 4; calm/cooperative; and mood congruent with affect. He is speaking in a clear tone at moderate volume, and normal pace; with good eye contact. his thought process is coherent and relevant; There is no indication that he is currently responding to internal/external stimuli or experiencing delusional thought content. He is reporting depressed mood and declined in ADLs.   Patient has remained calm throughout assessment and has answered questions appropriately, Patient to be admitted to [facility].       PHQ 2-9:  Flowsheet Row ED from 06/07/2021 in [facility] DEPT Video Visit from 03/30/2021 in [facility] from 12/28/2020 in [facility]  Thoughts that you would be better off dead, or of hurting yourself in some way  Nearly every day More than half the days Nearly every day  PHQ-9 Total Score '19 20 24       '$ Flowsheet Row Admission (Discharged) from 06/23/2021 in [facility] Most recent reading at 07/29/2021  9:13 AM Admission (Discharged) from 06/07/2021 in [facility] Most recent reading at 06/07/2021  3:15 PM ED from 06/07/2021 in [facility] DEPT Most recent reading at 06/07/2021  4:32 AM  C-SSRS RISK CATEGORY No Risk No Risk High Risk        Total Time spent with patient: 15 minutes  Musculoskeletal  Strength & Muscle Tone: within normal limits Gait & Station: normal Patient leans: N/A  Psychiatric Specialty Exam  Presentation General Appearance: Appropriate for Environment; Bizarre; Disheveled; Fairly Groomed  Eye Contact:Good  Speech:Clear and Coherent; Normal Rate  Speech Volume:Normal  Handedness:Right   Mood and Affect  Mood:Anxious; Depressed; Hopeless; Irritable  Affect:Blunt; Depressed   Thought Process  Thought Processes:Coherent; Linear  Descriptions of Associations:Intact  Orientation:Full (Time, Place and Person)  Thought Content:Logical; WDL  Diagnosis of Schizophrenia or Schizoaffective disorder in past: No   Hallucinations:No data recorded Ideas of Reference:None  Suicidal Thoughts:No data recorded Homicidal Thoughts:No data recorded  Sensorium  Memory:Immediate Fair; Recent Fair; Remote Fair  Judgment:Fair  Insight:Fair   Executive Functions  Concentration:Fair  Attention Span:Good  [place]  Language:Good   Psychomotor Activity  Psychomotor Activity:No data recorded  Assets  Assets:Communication Skills; Physical Health; Social Support   Sleep  Sleep:No data recorded  No data recorded  Physical Exam Vitals and nursing note reviewed.  Cardiovascular:     Rate and Rhythm: Normal rate and regular rhythm.   Neurological:     Mental Status: He is oriented to person, place, and time.  Psychiatric:        Mood and Affect: Mood normal.        Behavior: Behavior normal.        Thought Content: Thought content normal.   Review of Systems  Eyes: Negative.   Cardiovascular: Negative.   Musculoskeletal:  Positive for back pain.  Skin: Negative.   Endo/Heme/Allergies: Negative.   Psychiatric/Behavioral:  Positive for depression and suicidal ideas. The patient is nervous/anxious.   All other systems reviewed and are negative.  There were no vitals taken for this visit. There is no height or weight on file to calculate BMI.  Past Psychiatric History:    Is the patient at risk to self? Yes  Has the patient been a risk to self in the past 6 months? Yes .    Has the patient been a risk to self within the distant past? No   Is the patient a risk to others? No   Has the patient been a risk to others in the past 6 months? No   Has the patient been a risk to others within the distant past? No   Past Medical History:  Past Medical History:  Diagnosis Date   Anxiety    Asthma    Depression    No past surgical history on file.  Family History:  Family History  Problem Relation Age of Onset   Bipolar disorder Mother     Social History:  Social History   Socioeconomic History   Marital status: Single    Spouse name: Not on file   Number of children: 1   Years of education: Not on file   Highest education level: GED or equivalent  Occupational History   Not on file  Tobacco Use   Smoking status: Never   Smokeless tobacco: Never  Vaping Use   Vaping Use: Unknown  Substance and Sexual Activity   Alcohol use: No   Drug use: No   Sexual activity: Not Currently  Other Topics Concern   Not on file  Social History Narrative   Marital status:  Single   Children: Daughter   Employment:  Unemployment. previous work for [employer].   Alcohol:  None   Drugs:  none   Exercise:   Regularly very   Religion:  Jewish   Social Determinants of Radio broadcast assistant Strain: Not on file  Food Insecurity: Not on file  Transportation Needs: Not on file  Physical Activity: Not on file  Stress: Not on file  Social Connections: Not on file  Intimate Partner Violence: Not on file    SDOH:  SDOH Screenings   Alcohol Screen: Low Risk    Last Alcohol Screening Score (AUDIT): 0  Depression (PHQ2-9): Medium Risk   PHQ-2 Score: 19  Financial Resource Strain: Not on file  Food Insecurity: Not on file  Housing: Not on file  Physical Activity: Not on file  Social Connections: Not on file  Stress: Not on file  Tobacco Use: Low Risk    Smoking Tobacco Use: Never   Smokeless Tobacco Use: Never   Passive Exposure: Not on file  Transportation Needs: Not on file    Last Labs:  Admission on 06/23/2021, Discharged on 08/03/2021  Component Date Value Ref Range Status   Glucose-Capillary 06/27/2021 85  70 - 99 mg/dL Final   Glucose reference range applies only to samples taken after fasting for at least 8 hours.   Comment 1 06/27/2021 Notify RN   Final   Glucose-Capillary 06/29/2021 88  70 - 99 mg/dL Final   Glucose reference range applies only to samples taken after fasting for at least 8 hours.   Glucose-Capillary 06/29/2021 96  70 - 99 mg/dL Final   Glucose reference range applies only to samples taken after fasting for at least 8 hours.   Adenovirus 06/30/2021 NOT DETECTED  NOT DETECTED Final   Coronavirus 229E 06/30/2021 NOT DETECTED  NOT DETECTED Final   Comment: (NOTE) The Coronavirus on the Respiratory Panel, DOES NOT test for the novel  Coronavirus (2019 nCoV)    Coronavirus HKU1 06/30/2021 NOT DETECTED  NOT DETECTED Final   Coronavirus NL63 06/30/2021 NOT DETECTED  NOT DETECTED Final   Coronavirus OC43 06/30/2021 NOT DETECTED  NOT DETECTED Final   Metapneumovirus 06/30/2021 NOT DETECTED  NOT DETECTED Final   Rhinovirus / Enterovirus 06/30/2021 NOT  DETECTED  NOT DETECTED Final   Influenza A 06/30/2021 NOT DETECTED  NOT DETECTED Final   Influenza B 06/30/2021 NOT DETECTED  NOT DETECTED Final   Parainfluenza Virus 1 06/30/2021 NOT DETECTED  NOT DETECTED Final   Parainfluenza Virus 2 06/30/2021 NOT DETECTED  NOT DETECTED Final   Parainfluenza Virus 3 06/30/2021 NOT DETECTED  NOT DETECTED Final   Parainfluenza Virus 4 06/30/2021 NOT DETECTED  NOT DETECTED Final   Respiratory Syncytial Virus 06/30/2021 NOT DETECTED  NOT DETECTED Final   Bordetella pertussis 06/30/2021 NOT DETECTED  NOT DETECTED Final   Bordetella Parapertussis 06/30/2021 NOT DETECTED  NOT DETECTED Final   Chlamydophila pneumoniae 06/30/2021 NOT DETECTED  NOT DETECTED Final   Mycoplasma pneumoniae 06/30/2021 NOT DETECTED  NOT DETECTED Final   Performed at [facility], [place] [address] [place], [place] [address]   HIV Screen 4th Generation wRfx 06/30/2021 Non Reactive  Non Reactive Final   Performed at [facility], [place] [address] [place], [place] [address]   SARS Coronavirus 2 by RT PCR 06/30/2021 NEGATIVE  NEGATIVE Final   Comment: (NOTE) SARS-CoV-2 target nucleic acids are NOT DETECTED.  The SARS-CoV-2 RNA is generally detectable in upper respiratory specimens during the acute phase of infection. The lowest concentration of SARS-CoV-2 viral copies this assay can detect is 138 copies/mL. A negative result does not preclude SARS-Cov-2 infection and should not be used as the sole basis for treatment or other patient management decisions. A negative result may occur with  improper specimen collection/handling, submission of specimen other than nasopharyngeal swab, presence of viral mutation(s) within the areas targeted by this assay, and inadequate number of viral copies(<138 copies/mL). A negative result must be combined with clinical observations, patient history, and epidemiological information. The expected result is Negative.  Fact Sheet for Patients:   EntrepreneurPulse.com.au  Fact Sheet for Healthcare Providers:  IncredibleEmployment.be  This test is no                          t yet approved or cleared by the [place] FDA and  has been authorized for detection and/or diagnosis of SARS-CoV-2 by FDA under an Emergency Use Authorization (EUA). This EUA will remain  in effect (meaning this test can be used) for the duration of the COVID-19 declaration under Section 564(b)(1) of  the Act, 21 U.S.C.section 360bbb-3(b)(1), unless the authorization is terminated  or revoked sooner.       Influenza A by PCR 06/30/2021 NEGATIVE  NEGATIVE Final   Influenza B by PCR 06/30/2021 NEGATIVE  NEGATIVE Final   Comment: (NOTE) The Xpert Xpress SARS-CoV-2/FLU/RSV plus assay is intended as an aid in the diagnosis of influenza from Nasopharyngeal swab specimens and should not be used as a sole basis for treatment. Nasal washings and aspirates are unacceptable for Xpert Xpress SARS-CoV-2/FLU/RSV testing.  Fact Sheet for Patients: EntrepreneurPulse.com.au  Fact Sheet for Healthcare Providers: IncredibleEmployment.be  This test is not yet approved or cleared by the [place] FDA and has been authorized for detection and/or diagnosis of SARS-CoV-2 by FDA under an Emergency Use Authorization (EUA). This EUA will remain in effect (meaning this test can be used) for the duration of the COVID-19 declaration under Section 564(b)(1) of the Act, 21 U.S.C. section 360bbb-3(b)(1), unless the authorization is terminated or revoked.  Performed at [facility], [place] [place], [place] [address]    Campylobacter species 06/30/2021 NOT DETECTED  NOT DETECTED Final   Plesimonas shigelloides 06/30/2021 NOT DETECTED  NOT DETECTED Final   Salmonella species 06/30/2021 NOT DETECTED  NOT DETECTED Final   Yersinia enterocolitica 06/30/2021 NOT DETECTED  NOT DETECTED  Final   Vibrio species 06/30/2021 NOT DETECTED  NOT DETECTED Final   Vibrio cholerae 06/30/2021 NOT DETECTED  NOT DETECTED Final   Enteroaggregative E coli (EAEC) 06/30/2021 NOT DETECTED  NOT DETECTED Final   Enteropathogenic E coli (EPEC) 06/30/2021 NOT DETECTED  NOT DETECTED Final   Enterotoxigenic E coli (ETEC) 06/30/2021 NOT DETECTED  NOT DETECTED Final   Shiga like toxin producing E coli * 06/30/2021 NOT DETECTED  NOT DETECTED Final   Shigella/Enteroinvasive E coli (EI* 06/30/2021 NOT DETECTED  NOT DETECTED Final   Cryptosporidium 06/30/2021 NOT DETECTED  NOT DETECTED Final   Cyclospora cayetanensis 06/30/2021 NOT DETECTED  NOT DETECTED Final   Entamoeba histolytica 06/30/2021 NOT DETECTED  NOT DETECTED Final   Giardia lamblia 06/30/2021 NOT DETECTED  NOT DETECTED Final   Adenovirus F40/41 06/30/2021 NOT DETECTED  NOT DETECTED Final   Astrovirus 06/30/2021 NOT DETECTED  NOT DETECTED Final   Norovirus GI/GII 06/30/2021 NOT DETECTED  NOT DETECTED Final   Rotavirus A 06/30/2021 NOT DETECTED  NOT DETECTED Final   Sapovirus (I, II, IV, and V) 06/30/2021 NOT DETECTED  NOT DETECTED Final   Performed at [facility], [place] [place],  [address]   Glucose-Capillary 07/01/2021 82  70 - 99 mg/dL Final   Glucose reference range applies only to samples taken after fasting for at least 8 hours.   Glucose-Capillary 07/04/2021 105 (H)  70 - 99 mg/dL Final   Glucose reference range applies only to samples taken after fasting for at least 8 hours.   Comment 1 07/04/2021 Notify RN   Final   Glucose-Capillary 07/06/2021 90  70 - 99 mg/dL Final   Glucose reference range applies only to samples taken after fasting for at least 8 hours.   Glucose-Capillary 07/08/2021 84  70 - 99 mg/dL Final   Glucose reference range applies only to samples taken after fasting for at least 8 hours.   Glucose-Capillary 07/11/2021 81  70 - 99 mg/dL Final   Glucose reference range applies only to  samples taken after fasting for at least 8 hours.   Glucose-Capillary 07/11/2021 91  70 - 99 mg/dL Final   Glucose reference range applies  only to samples taken after fasting for at least 8 hours.   Glucose-Capillary 07/13/2021 87  70 - 99 mg/dL Final   Glucose reference range applies only to samples taken after fasting for at least 8 hours.   Glucose-Capillary 07/15/2021 78  70 - 99 mg/dL Final   Glucose reference range applies only to samples taken after fasting for at least 8 hours.   Glucose-Capillary 07/18/2021 92  70 - 99 mg/dL Final   Glucose reference range applies only to samples taken after fasting for at least 8 hours.   TSH 07/26/2021 1.361  0.350 - 4.500 uIU/mL Final   Comment: Performed by a 3rd Generation assay with a functional sensitivity of <=0.01 uIU/mL. Performed at [facility], [place], [place] [address]    Sodium 07/26/2021 137  135 - 145 mmol/L Final   Potassium 07/26/2021 4.1  3.5 - 5.1 mmol/L Final   Chloride 07/26/2021 100  98 - 111 mmol/L Final   CO2 07/26/2021 27  22 - 32 mmol/L Final   Glucose, Bld 07/26/2021 131 (H)  70 - 99 mg/dL Final   Glucose reference range applies only to samples taken after fasting for at least 8 hours.   BUN 07/26/2021 21  8 - 23 mg/dL Final   Creatinine, Ser 07/26/2021 1.06  0.61 - 1.24 mg/dL Final   Calcium 07/26/2021 9.0  8.9 - 10.3 mg/dL Final   GFR, Estimated 07/26/2021 >60  >60 mL/min Final   Comment: (NOTE) Calculated using the CKD-EPI Creatinine Equation (2021)    Anion gap 07/26/2021 10  5 - 15 Final   Performed at [facility], [place], [place] [address]   Lithium Lvl 07/30/2021 0.48 (L)  0.60 - 1.20 mmol/L Final   Performed at [facility], [place] [place], [place] [address]  Admission on 06/07/2021, Discharged on 06/23/2021  Component Date Value Ref Range Status   Sodium 06/09/2021 130 (L)  135 - 145 mmol/L Final   Potassium 06/09/2021 3.7  3.5 -  5.1 mmol/L Final   Chloride 06/09/2021 95 (L)  98 - 111 mmol/L Final   CO2 06/09/2021 23  22 - 32 mmol/L Final   Glucose, Bld 06/09/2021 103 (H)  70 - 99 mg/dL Final   Glucose reference range applies only to samples taken after fasting for at least 8 hours.   BUN 06/09/2021 10  8 - 23 mg/dL Final   Creatinine, Ser 06/09/2021 0.89  0.61 - 1.24 mg/dL Final   Calcium 06/09/2021 9.8  8.9 - 10.3 mg/dL Final   GFR, Estimated 06/09/2021 >60  >60 mL/min Final   Comment: (NOTE) Calculated using the CKD-EPI Creatinine Equation (2021)    Anion gap 06/09/2021 12  5 - 15 Final   Performed at [facility], [place] [address] [place],  [address]   Vitamin B-12 06/10/2021 325  180 - 914 pg/mL Final   Comment: (NOTE) This assay is not validated for testing neonatal or myeloproliferative syndrome specimens for Vitamin B12 levels. Performed at [facility], [place] [address], [place], [place] [address]    Vit D, 25-Hydroxy 06/10/2021 14.94 (L)  30 - 100 ng/mL Final   Comment: (NOTE) Vitamin D deficiency has been defined by the [place] practice guideline as a level of serum 25-OH  vitamin D less than 20 ng/mL (1,2). The Endocrine Society went on to  further define vitamin D insufficiency as a  level between 21 and 29  ng/mL (2).  1. IOM (Institute of Medicine). 2010. Dietary reference intakes for  calcium and D. [place]: The [employer]. 2. Holick MF, Binkley [place], Bischoff-Ferrari HA, et al. Evaluation,  treatment, and prevention of vitamin D deficiency: an Endocrine  Society clinical practice guideline, JCEM. 2011 Jul; 96(7): 1911-30.  Performed at [facility], [place] [address] [place], [place] [address]    Folate 06/10/2021 9.8  >5.9 ng/mL Final   Performed at [facility], [place] [address], [place], [place] [address]   Vitamin B1 (Thiamine) 06/10/2021 121.6  66.5 - 200.0  nmol/L Final   Comment: (NOTE) This test was developed and its performance characteristics determined by [facility]. It has not been cleared or approved by the Food and Drug Administration. Performed At: [facility] [place], [place] [id number] Rush Farmer MD JT:[id number]    Sodium 06/12/2021 133 (L)  135 - 145 mmol/L Final   Potassium 06/12/2021 3.6  3.5 - 5.1 mmol/L Final   Chloride 06/12/2021 99  98 - 111 mmol/L Final   CO2 06/12/2021 28  22 - 32 mmol/L Final   Glucose, Bld 06/12/2021 94  70 - 99 mg/dL Final   Glucose reference range applies only to samples taken after fasting for at least 8 hours.   BUN 06/12/2021 14  8 - 23 mg/dL Final   Creatinine, Ser 06/12/2021 1.03  0.61 - 1.24 mg/dL Final   Calcium 06/12/2021 8.9  8.9 - 10.3 mg/dL Final   GFR, Estimated 06/12/2021 >60  >60 mL/min Final   Comment: (NOTE) Calculated using the CKD-EPI Creatinine Equation (2021)    Anion gap 06/12/2021 6  5 - 15 Final   Performed at [facility], [place] [address] [place], [place] [address]   Sodium 06/16/2021 135  135 - 145 mmol/L Final   Potassium 06/16/2021 3.6  3.5 - 5.1 mmol/L Final   Chloride 06/16/2021 102  98 - 111 mmol/L Final   CO2 06/16/2021 24  22 - 32 mmol/L Final   Glucose, Bld 06/16/2021 84  70 - 99 mg/dL Final   Glucose reference range applies only to samples taken after fasting for at least 8 hours.   BUN 06/16/2021 16  8 - 23 mg/dL Final   Creatinine, Ser 06/16/2021 0.86  0.61 - 1.24 mg/dL Final   Calcium 06/16/2021 8.9  8.9 - 10.3 mg/dL Final   Total Protein 06/16/2021 6.5  6.5 - 8.1 g/dL Final   Albumin 06/16/2021 3.7  3.5 - 5.0 g/dL Final   AST 06/16/2021 18  15 - 41 U/L Final   ALT 06/16/2021 20  0 - 44 U/L Final   Alkaline Phosphatase 06/16/2021 53  38 - 126 U/L Final   Total Bilirubin 06/16/2021 0.3  0.3 - 1.2 mg/dL Final   GFR, Estimated 06/16/2021 >60  >60 mL/min Final   Comment: (NOTE) Calculated using the CKD-EPI  Creatinine Equation (2021)    Anion gap 06/16/2021 9  5 - 15 Final   Performed at [facility], [place] [address], [place], [place] [address]   SARS Coronavirus 2 by RT PCR 06/21/2021 NEGATIVE  NEGATIVE Final   Comment: (NOTE) SARS-CoV-2 target nucleic acids are NOT DETECTED.  The SARS-CoV-2 RNA is generally detectable in upper respiratory specimens during the acute phase of infection. The lowest concentration of SARS-CoV-2 viral copies this assay can detect is 138 copies/mL. A negative result does not preclude SARS-Cov-2 infection  and should not be used as the sole basis for treatment or other patient management decisions. A negative result may occur with  improper specimen collection/handling, submission of specimen other than nasopharyngeal swab, presence of viral mutation(s) within the areas targeted by this assay, and inadequate number of viral copies(<138 copies/mL). A negative result must be combined with clinical observations, patient history, and epidemiological information. The expected result is Negative.  Fact Sheet for Patients:  EntrepreneurPulse.com.au  Fact Sheet for Healthcare Providers:  IncredibleEmployment.be  This test is no                          t yet approved or cleared by the [place] FDA and  has been authorized for detection and/or diagnosis of SARS-CoV-2 by FDA under an Emergency Use Authorization (EUA). This EUA will remain  in effect (meaning this test can be used) for the duration of the COVID-19 declaration under Section 564(b)(1) of the Act, 21 U.S.C.section 360bbb-3(b)(1), unless the authorization is terminated  or revoked sooner.       Influenza A by PCR 06/21/2021 NEGATIVE  NEGATIVE Final   Influenza B by PCR 06/21/2021 NEGATIVE  NEGATIVE Final   Comment: (NOTE) The Xpert Xpress SARS-CoV-2/FLU/RSV plus assay is intended as an aid in the diagnosis of influenza from Nasopharyngeal  swab specimens and should not be used as a sole basis for treatment. Nasal washings and aspirates are unacceptable for Xpert Xpress SARS-CoV-2/FLU/RSV testing.  Fact Sheet for Patients: EntrepreneurPulse.com.au  Fact Sheet for Healthcare Providers: IncredibleEmployment.be  This test is not yet approved or cleared by the [place] FDA and has been authorized for detection and/or diagnosis of SARS-CoV-2 by FDA under an Emergency Use Authorization (EUA). This EUA will remain in effect (meaning this test can be used) for the duration of the COVID-19 declaration under Section 564(b)(1) of the Act, 21 U.S.C. section 360bbb-3(b)(1), unless the authorization is terminated or revoked.  Performed at [facility], [place] [address], [place], [place] [address]    Sodium 06/23/2021 137  135 - 145 mmol/L Final   Potassium 06/23/2021 4.0  3.5 - 5.1 mmol/L Final   Chloride 06/23/2021 103  98 - 111 mmol/L Final   CO2 06/23/2021 28  22 - 32 mmol/L Final   Glucose, Bld 06/23/2021 85  70 - 99 mg/dL Final   Glucose reference range applies only to samples taken after fasting for at least 8 hours.   BUN 06/23/2021 14  8 - 23 mg/dL Final   Creatinine, Ser 06/23/2021 0.85  0.61 - 1.24 mg/dL Final   Calcium 06/23/2021 9.1  8.9 - 10.3 mg/dL Final   GFR, Estimated 06/23/2021 >60  >60 mL/min Final   Comment: (NOTE) Calculated using the CKD-EPI Creatinine Equation (2021)    Anion gap 06/23/2021 6  5 - 15 Final   Performed at [facility], [place] [address] [place], [place] [address]   SARS Coronavirus 2 by RT PCR 06/23/2021 NEGATIVE  NEGATIVE Final   Comment: (NOTE) SARS-CoV-2 target nucleic acids are NOT DETECTED.  The SARS-CoV-2 RNA is generally detectable in upper respiratory specimens during the acute phase of infection. The lowest concentration of SARS-CoV-2 viral copies this assay can detect is 138 copies/mL. A negative  result does not preclude SARS-Cov-2 infection and should not be used as the sole basis for treatment or other patient management decisions. A negative result may occur with  improper specimen collection/handling, submission of specimen other  than nasopharyngeal swab, presence of viral mutation(s) within the areas targeted by this assay, and inadequate number of viral copies(<138 copies/mL). A negative result must be combined with clinical observations, patient history, and epidemiological information. The expected result is Negative.  Fact Sheet for Patients:  EntrepreneurPulse.com.au  Fact Sheet for Healthcare Providers:  IncredibleEmployment.be  This test is no                          t yet approved or cleared by the [place] FDA and  has been authorized for detection and/or diagnosis of SARS-CoV-2 by FDA under an Emergency Use Authorization (EUA). This EUA will remain  in effect (meaning this test can be used) for the duration of the COVID-19 declaration under Section 564(b)(1) of the Act, 21 U.S.C.section 360bbb-3(b)(1), unless the authorization is terminated  or revoked sooner.       Influenza A by PCR 06/23/2021 NEGATIVE  NEGATIVE Final   Influenza B by PCR 06/23/2021 NEGATIVE  NEGATIVE Final   Comment: (NOTE) The Xpert Xpress SARS-CoV-2/FLU/RSV plus assay is intended as an aid in the diagnosis of influenza from Nasopharyngeal swab specimens and should not be used as a sole basis for treatment. Nasal washings and aspirates are unacceptable for Xpert Xpress SARS-CoV-2/FLU/RSV testing.  Fact Sheet for Patients: EntrepreneurPulse.com.au  Fact Sheet for Healthcare Providers: IncredibleEmployment.be  This test is not yet approved or cleared by the [place] FDA and has been authorized for detection and/or diagnosis of SARS-CoV-2 by FDA under an Emergency Use Authorization (EUA). This EUA will  remain in effect (meaning this test can be used) for the duration of the COVID-19 declaration under Section 564(b)(1) of the Act, 21 U.S.C. section 360bbb-3(b)(1), unless the authorization is terminated or revoked.  Performed at [facility], [place] [address], [place], [place] [address]   Admission on 06/07/2021, Discharged on 06/07/2021  Component Date Value Ref Range Status   Sodium 06/07/2021 128 (L)  135 - 145 mmol/L Final   Potassium 06/07/2021 3.6  3.5 - 5.1 mmol/L Final   Chloride 06/07/2021 93 (L)  98 - 111 mmol/L Final   CO2 06/07/2021 21 (L)  22 - 32 mmol/L Final   Glucose, Bld 06/07/2021 110 (H)  70 - 99 mg/dL Final   Glucose reference range applies only to samples taken after fasting for at least 8 hours.   BUN 06/07/2021 10  8 - 23 mg/dL Final   Creatinine, Ser 06/07/2021 1.24  0.61 - 1.24 mg/dL Final   Calcium 06/07/2021 9.3  8.9 - 10.3 mg/dL Final   Total Protein 06/07/2021 7.5  6.5 - 8.1 g/dL Final   Albumin 06/07/2021 4.5  3.5 - 5.0 g/dL Final   AST 06/07/2021 22  15 - 41 U/L Final   ALT 06/07/2021 12  0 - 44 U/L Final   Alkaline Phosphatase 06/07/2021 63  38 - 126 U/L Final   Total Bilirubin 06/07/2021 0.7  0.3 - 1.2 mg/dL Final   GFR, Estimated 06/07/2021 >60  >60 mL/min Final   Comment: (NOTE) Calculated using the CKD-EPI Creatinine Equation (2021)    Anion gap 06/07/2021 14  5 - 15 Final   Performed at [facility], [place] [address] [place], [place] [address]   Alcohol, Ethyl (B) 06/07/2021 <10  <10 mg/dL Final   Comment: (NOTE) Lowest detectable limit for serum alcohol is 10 mg/dL.  For medical purposes only. Performed at [facility], [place]  [address] [place], [place] [address]    Salicylate Lvl 71/16/5790 <7.0 (L)  7.0 - 30.0 mg/dL Final   Performed at [place] [address], [place], [place] [address]   Acetaminophen (Tylenol), Serum 06/07/2021 17  10 - 30 ug/mL Final    Comment: (NOTE) Therapeutic concentrations vary significantly. A range of 10-30 ug/mL  may be an effective concentration for many patients. However, some  are best treated at concentrations outside of this range. Acetaminophen concentrations >150 ug/mL at 4 hours after ingestion  and >50 ug/mL at 12 hours after ingestion are often associated with  toxic reactions.  Performed at [facility], [place] [address], [place], [place] [address]    WBC 06/07/2021 6.5  4.0 - 10.5 K/uL Final   RBC 06/07/2021 5.01  4.22 - 5.81 MIL/uL Final   Hemoglobin 06/07/2021 14.0  13.0 - 17.0 g/dL Final   HCT 06/07/2021 41.3  39.0 - 52.0 % Final   MCV 06/07/2021 82.4  80.0 - 100.0 fL Final   MCH 06/07/2021 27.9  26.0 - 34.0 pg Final   MCHC 06/07/2021 33.9  30.0 - 36.0 g/dL Final   RDW 06/07/2021 13.6  11.5 - 15.5 % Final   Platelets 06/07/2021 333  150 - 400 K/uL Final   nRBC 06/07/2021 0.0  0.0 - 0.2 % Final   Performed at [facility], [place] [address], [place], [place] [address]   Opiates 06/07/2021 NONE DETECTED  NONE DETECTED Final   Cocaine 06/07/2021 NONE DETECTED  NONE DETECTED Final   Benzodiazepines 06/07/2021 NONE DETECTED  NONE DETECTED Final   Amphetamines 06/07/2021 NONE DETECTED  NONE DETECTED Final   Tetrahydrocannabinol 06/07/2021 NONE DETECTED  NONE DETECTED Final   Barbiturates 06/07/2021 NONE DETECTED  NONE DETECTED Final   Comment: (NOTE) DRUG SCREEN FOR MEDICAL PURPOSES ONLY.  IF CONFIRMATION IS NEEDED FOR ANY PURPOSE, NOTIFY LAB WITHIN 5 DAYS.  LOWEST DETECTABLE LIMITS FOR URINE DRUG SCREEN Drug Class                     Cutoff (ng/mL) Amphetamine and metabolites    1000 Barbiturate and metabolites    200 Benzodiazepine                 600 Tricyclics and metabolites     300 Opiates and metabolites        300 Cocaine and metabolites        300 THC                            50 Performed at [facility], [place] [address] [place], [place] [address]    SARS Coronavirus 2 by RT PCR 06/07/2021 NEGATIVE  NEGATIVE Final   Comment: (NOTE) SARS-CoV-2 target nucleic acids are NOT DETECTED.  The SARS-CoV-2 RNA is generally detectable in upper respiratory specimens during the acute phase of infection. The lowest concentration of SARS-CoV-2 viral copies this assay can detect is 138 copies/mL. A negative result does not preclude SARS-Cov-2 infection and should not be used as the sole basis for treatment or other patient management decisions. A negative result may occur with  improper specimen collection/handling, submission of specimen other than nasopharyngeal swab, presence of viral mutation(s) within the areas targeted by this assay, and inadequate number of viral copies(<138 copies/mL). A negative result must be combined with clinical observations, patient history, and epidemiological information. The expected result is  Negative.  Fact Sheet for Patients:  EntrepreneurPulse.com.au  Fact Sheet for Healthcare Providers:  IncredibleEmployment.be  This test is no                          t yet approved or cleared by the [place] FDA and  has been authorized for detection and/or diagnosis of SARS-CoV-2 by FDA under an Emergency Use Authorization (EUA). This EUA will remain  in effect (meaning this test can be used) for the duration of the COVID-19 declaration under Section 564(b)(1) of the Act, 21 U.S.C.section 360bbb-3(b)(1), unless the authorization is terminated  or revoked sooner.       Influenza A by PCR 06/07/2021 NEGATIVE  NEGATIVE Final   Influenza B by PCR 06/07/2021 NEGATIVE  NEGATIVE Final   Comment: (NOTE) The Xpert Xpress SARS-CoV-2/FLU/RSV plus assay is intended as an aid in the diagnosis of influenza from Nasopharyngeal swab specimens and should not be used as a sole basis for treatment. Nasal washings and aspirates are unacceptable for Xpert Xpress  SARS-CoV-2/FLU/RSV testing.  Fact Sheet for Patients: EntrepreneurPulse.com.au  Fact Sheet for Healthcare Providers: IncredibleEmployment.be  This test is not yet approved or cleared by the [place] FDA and has been authorized for detection and/or diagnosis of SARS-CoV-2 by FDA under an Emergency Use Authorization (EUA). This EUA will remain in effect (meaning this test can be used) for the duration of the COVID-19 declaration under Section 564(b)(1) of the Act, 21 U.S.C. section 360bbb-3(b)(1), unless the authorization is terminated or revoked.  Performed at [facility], [place] [address] [place], [place] [address]    Magnesium 06/07/2021 1.7  1.7 - 2.4 mg/dL Final   Performed at [place] [address], [place], [place] [address]   Sodium 06/07/2021 131 (L)  135 - 145 mmol/L Final   Potassium 06/07/2021 3.8  3.5 - 5.1 mmol/L Final   Chloride 06/07/2021 97 (L)  98 - 111 mmol/L Final   CO2 06/07/2021 25  22 - 32 mmol/L Final   Glucose, Bld 06/07/2021 187 (H)  70 - 99 mg/dL Final   Glucose reference range applies only to samples taken after fasting for at least 8 hours.   BUN 06/07/2021 8  8 - 23 mg/dL Final   Creatinine, Ser 06/07/2021 1.11  0.61 - 1.24 mg/dL Final   Calcium 06/07/2021 9.3  8.9 - 10.3 mg/dL Final   GFR, Estimated 06/07/2021 >60  >60 mL/min Final   Comment: (NOTE) Calculated using the CKD-EPI Creatinine Equation (2021)    Anion gap 06/07/2021 9  5 - 15 Final   Performed at [facility], [place] [address], [place], [place] [address]   Magnesium 06/07/2021 1.7  1.7 - 2.4 mg/dL Final   Performed at [place] [address], [place], [place] [address]   Cholesterol 06/07/2021 197  0 - 200 mg/dL Final   Triglycerides 06/07/2021 149  <150 mg/dL Final   HDL 06/07/2021 44  >40 mg/dL Final   Total CHOL/HDL Ratio 06/07/2021 4.5  RATIO Final   VLDL  06/07/2021 30  0 - 40 mg/dL Final   LDL Cholesterol 06/07/2021 123 (H)  0 - 99 mg/dL Final   Comment:        Total Cholesterol/HDL:CHD Risk Coronary Heart Disease Risk Table                     Men   Women  1/2  Average Risk   3.4   3.3  Average Risk       5.0   4.4  2 X Average Risk   9.6   7.1  3 X Average Risk  23.4   11.0        Use the calculated Patient Ratio above and the CHD Risk Table to determine the patient's CHD Risk.        ATP III CLASSIFICATION (LDL):  <100     mg/dL   Optimal  100-129  mg/dL   Near or Above                    Optimal  130-159  mg/dL   Borderline  160-189  mg/dL   High  >190     mg/dL   Very High Performed at [place] [address], [place], [place] [address]    TSH 06/07/2021 1.328  0.350 - 4.500 uIU/mL Final   Comment: Performed by a 3rd Generation assay with a functional sensitivity of <=0.01 uIU/mL. Performed at [facility], [place] [address] [place], [place] [address]    Hgb A1c MFr Bld 06/07/2021 5.1  4.8 - 5.6 % Final   Comment: (NOTE) Pre diabetes:          5.7%-6.4%  Diabetes:              >6.4%  Glycemic control for   <7.0% adults with diabetes    Mean Plasma Glucose 06/07/2021 99.67  mg/dL Final   Performed at [facility], [place] [address] [place],  [address]    Allergies: Dust mite mixed allergen ext [mite (d. farinae)] and Bee pollen  PTA Medications: (Not in a hospital admission)   Long Term Goals: Improvement in symptoms so as ready for discharge  Short Term Goals: Ability to identify changes in lifestyle to reduce recurrence of condition will improve, Ability to verbalize feelings will improve, Ability to disclose and discuss suicidal ideas, and Ability to demonstrate self-control will improve  Medical Decision Making  Patient to be admitted to [facility] Will restart home medications where appropriate     Recommendations  Based on my evaluation the patient does not  appear to have an emergency medical condition.  Derrill Center, NP 09/10/21  1:43 PM

## 2021-09-10 NOTE — ED Notes (Signed)
Pt was escorted from [facility] obs to [facility].  Pt has flat sad affect and continues to report bilat Flank and lower back pain.  Reports that pain increases with movement.  Pt was escorted to his room and was able to undress for skin assessment.  Pt stated that he was very tired and asked if he could rest until dinner.

## 2021-09-10 NOTE — ED Triage Notes (Signed)
Pt states that he has been having lower back pain intermittently for 2 weeks. Rated his pain 8/10 & explains it as shooting pain. Decreased urination, A/Ox4.

## 2021-09-10 NOTE — Discharge Instructions (Signed)
Take all medications as prescribed. Keep all follow-up appointments as scheduled.  Do not consume alcohol or use illegal drugs while on prescription medications. Report any adverse effects from your medications to your primary care provider promptly.  In the event of recurrent symptoms or worsening symptoms, call 911, a crisis hotline, or go to the nearest emergency department for evaluation.   

## 2021-09-10 NOTE — ED Notes (Signed)
Pt  being admitted to [facility] unit, pending 2 hour covid test. He is currently on observation unit. EKG completed along with skin assessment. Denies any SI/HI at present, denies A/V hallucinations. C/o depression and lack of support system. Oriented to unit. Given lunch. Will continue to monitor.
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
## 2021-09-10 NOTE — ED Provider Notes (Signed)
Patient to be transferred to [facility] due to concerns of lower back pain x1 week and difficulty urinating.  NP spoke to emergency room physician Tyrone Nine as a excepting provider EMTALA completed.  Patient to be returned to facility based crisis once medically cleared.

## 2021-09-10 NOTE — ED Notes (Signed)
Pt just arrived to the unit  

## 2021-09-10 NOTE — ED Provider Notes (Signed)
[facility] EMERGENCY DEPARTMENT Provider Note   CSN: [id number] Arrival date & time: 09/10/21  1712     History  Chief Complaint  Patient presents with   Back Pain    Casey Silva is a 66 y.o. male with a history of MDD, bipolar 1 disorder, anxiety, HTN presenting to the ED with back pain.  Patient was seen at [facility] earlier today and admitted for his depressive symptoms.  However, he was complaining of back pain and decreased urine output so was sent to the ED for medical clearance.  He states that he has had back pain for several weeks but acutely worsened over the past few days.  Denies any trauma.  He states that the pain worsens with twisting motions and with bending over.  He states that he feels his urine output has been less than usual but denies any hematuria.  No urinary or fecal incontinence.  His back pain is over the lower back diffusely.  Denies any fevers, nausea, vomiting, abdominal pain.  No history of IV drug use.  Denies saddle anesthesia.   Back Pain Associated symptoms: no abdominal pain, no chest pain, no fever, no numbness and no weakness       Home Medications Prior to Admission medications   Medication Sig Start Date End Date Taking? Authorizing Provider  methocarbamol (ROBAXIN) 500 MG tablet Take 1 tablet (500 mg total) by mouth 2 (two) times daily for 5 days. 09/10/21 09/15/21 Yes Sondra Come, MD      Allergies    Dust mite mixed allergen ext [mite (d. farinae)] and Bee pollen    Review of Systems   Review of Systems  Constitutional:  Negative for fever.  Respiratory:  Negative for shortness of breath.   Cardiovascular:  Negative for chest pain.  Gastrointestinal:  Negative for abdominal pain, diarrhea, nausea and vomiting.  Musculoskeletal:  Positive for back pain.  Neurological:  Negative for seizures, weakness, light-headedness and numbness.   Physical Exam Updated Vital Signs BP (!) 177/78 (BP Location: Right Arm)   Pulse 80    Temp 98.1 F (36.7 C) (Oral)   Resp 19   Ht '5\' 8"'$  (1.727 m)   Wt 81.6 kg   SpO2 100%   BMI 27.37 kg/m  Physical Exam Constitutional:      General: He is not in acute distress.    Appearance: He is not toxic-appearing or diaphoretic.  HENT:     Head: Normocephalic and atraumatic.     Right Ear: External ear normal.     Left Ear: External ear normal.  Cardiovascular:     Rate and Rhythm: Normal rate and regular rhythm.  Pulmonary:     Effort: Pulmonary effort is normal. No respiratory distress.  Abdominal:     General: There is no distension.     Palpations: Abdomen is soft.     Tenderness: There is no abdominal tenderness. There is no right CVA tenderness, left CVA tenderness, guarding or rebound.  Musculoskeletal:        General: No deformity.     Cervical back: Neck supple.     Right lower leg: No edema.     Left lower leg: No edema.     Comments: No midline tenderness, step-offs, or deformities over the thoracic or lumbar back. He points mainly over the lower lumbar paraspinal muscles, but does not have any pinpoint tenderness. Negative straight leg raise bilaterally.  Skin:    General: Skin is warm and dry.  Neurological:     General: No focal deficit present.     Mental Status: He is alert and oriented to person, place, and time.     Sensory: No sensory deficit.     Motor: No weakness.     Comments: 5/5 strength in the bilateral lower extremities.  Sensation is intact.    ED Results / Procedures / Treatments   Labs (all labs ordered are listed, but only abnormal results are displayed) Labs Reviewed  URINALYSIS, ROUTINE W REFLEX MICROSCOPIC - Abnormal; Notable for the following components:      Result Value   Color, Urine STRAW (*)    Hgb urine dipstick MODERATE (*)    Bacteria, UA RARE (*)    All other components within normal limits    EKG None  Radiology No results found.  Procedures Procedures    Medications Ordered in ED Medications  ketorolac  (TORADOL) injection 30 mg (30 mg Intramuscular Given 09/10/21 1920)  methocarbamol (ROBAXIN) tablet 500 mg (500 mg Oral Given 09/10/21 2142)    ED Course/ Medical Decision Making/ A&P                           Medical Decision Making Amount and/or Complexity of Data Reviewed Labs: ordered.  Risk Prescription drug management.   Casey Silva is a 66 y.o. male with a history of MDD, bipolar 1 disorder, anxiety, HTN presenting to the ED with back pain.  On exam, the patient is afebrile and hemodynamically stable.  He has no midline tenderness over the thoracic or lumbar spine.  No point tenderness over the paraspinal muscles but this is where he points that his pain.  He is neurologically intact of the lower extremities.  He denies any saddle anesthesias.  I have a very low suspicion for an infectious process such as an epidural abscess given patient has no history of IV drug use and has no midline tenderness over the spine.  Low suspicion for pyelonephritis or nephrolithiasis as the patient does not have any CVA tenderness and the pain worsens with certain movements.  Very low suspicion for cauda equina given patient does not have any saddle anesthesia.  Additionally, patient was able to provide a urine sample and his postvoid residual was 68.  Given he is neurologically intact and is not having any urinary retention or incontinence, I do not think that further imaging is needed at this time.  UA was obtained which did not show any signs of infection.  Patient had baseline labs drawn earlier today at the facility which showed a normal creatinine and no leukocytosis.  Overall his symptoms appear to be most consistent with a lumbar muscle strain.  He was given IM Toradol and p.o. Robaxin while in the ED.  He already has lidocaine patches in place.  He does endorse improvement in his pain with these medications.  I spoke with the [facility] provider on-call and advised that the patient is medically clear and  can return to their facility.  Discussed the results with the patient and recommended Tylenol/Motrin for pain.  He was also given a prescription for Robaxin.  Strict return precautions were discussed and the patient was discharged in stable condition.        Final Clinical Impression(s) / ED Diagnoses Final diagnoses:  Acute bilateral low back pain without sciatica    Rx / DC Orders ED Discharge Orders  Ordered    methocarbamol (ROBAXIN) 500 MG tablet  2 times daily        09/10/21 2158              Sondra Come, MD 09/10/21 0488    Sherwood Gambler, MD 09/11/21 1550

## 2021-09-11 DIAGNOSIS — Z20822 Contact with and (suspected) exposure to covid-19: Secondary | ICD-10-CM | POA: Diagnosis not present

## 2021-09-11 DIAGNOSIS — F411 Generalized anxiety disorder: Secondary | ICD-10-CM | POA: Diagnosis not present

## 2021-09-11 DIAGNOSIS — F333 Major depressive disorder, recurrent, severe with psychotic symptoms: Secondary | ICD-10-CM | POA: Diagnosis not present

## 2021-09-11 LAB — POCT URINE DRUG SCREEN - MANUAL ENTRY (I-SCREEN)
POC Amphetamine UR: NOT DETECTED
POC Buprenorphine (BUP): NOT DETECTED
POC Cocaine UR: NOT DETECTED
POC Marijuana UR: NOT DETECTED
POC Methadone UR: NOT DETECTED
POC Methamphetamine UR: NOT DETECTED
POC Morphine: NOT DETECTED
POC Oxazepam (BZO): NOT DETECTED
POC Oxycodone UR: NOT DETECTED
POC Secobarbital (BAR): NOT DETECTED

## 2021-09-11 MED ORDER — ALUM & MAG HYDROXIDE-SIMETH 200-200-20 MG/5ML PO SUSP: 30.0000 mL | ORAL | Status: DC | PRN

## 2021-09-11 MED ORDER — VENLAFAXINE HCL ER 75 MG PO CP24
75.0000 mg | ORAL_CAPSULE | Freq: Once | ORAL | Status: AC
  Administered 2021-09-11: 75 mg via ORAL
  Filled 2021-09-11: qty 1

## 2021-09-11 MED ORDER — LIDOCAINE 5 % EX PTCH
2.0000 | MEDICATED_PATCH | Freq: Once | CUTANEOUS | Status: AC
  Administered 2021-09-11: 2 via TRANSDERMAL
  Filled 2021-09-11: qty 2

## 2021-09-11 MED ORDER — FAMOTIDINE 20 MG PO TABS
20.0000 mg | ORAL_TABLET | Freq: Once | ORAL | Status: AC
  Administered 2021-09-11: 20 mg via ORAL
  Filled 2021-09-11: qty 1

## 2021-09-11 MED ORDER — LITHIUM CARBONATE ER 450 MG PO TBCR
450.0000 mg | EXTENDED_RELEASE_TABLET | Freq: Two times a day (BID) | ORAL | Status: DC
Start: 2021-09-11 — End: 2021-09-13
  Administered 2021-09-11 – 2021-09-13 (×5): 450 mg via ORAL
  Filled 2021-09-11 (×5): qty 1

## 2021-09-11 MED ORDER — METHOCARBAMOL 500 MG PO TABS
500.0000 mg | ORAL_TABLET | Freq: Two times a day (BID) | ORAL | Status: DC
  Administered 2021-09-11 – 2021-09-13 (×5): 500 mg via ORAL
  Filled 2021-09-11 (×5): qty 1

## 2021-09-11 MED ORDER — POTASSIUM CHLORIDE CRYS ER 20 MEQ PO TBCR
20.0000 meq | EXTENDED_RELEASE_TABLET | Freq: Two times a day (BID) | ORAL | Status: AC
  Administered 2021-09-11 (×2): 20 meq via ORAL
  Filled 2021-09-11 (×2): qty 1

## 2021-09-11 MED ORDER — QUETIAPINE FUMARATE 100 MG PO TABS
100.0000 mg | ORAL_TABLET | Freq: Every day | ORAL | Status: DC
Start: 2021-09-11 — End: 2021-09-12
  Administered 2021-09-11: 100 mg via ORAL
  Filled 2021-09-11: qty 1

## 2021-09-11 MED ORDER — MAGNESIUM HYDROXIDE 400 MG/5ML PO SUSP: 30.0000 mL | Freq: Every day | ORAL | Status: DC | PRN

## 2021-09-11 MED ORDER — ACETAMINOPHEN 325 MG PO TABS: 650.0000 mg | ORAL_TABLET | Freq: Four times a day (QID) | ORAL | Status: DC | PRN

## 2021-09-11 MED ORDER — MIRTAZAPINE 15 MG PO TABS
15.0000 mg | ORAL_TABLET | Freq: Every day | ORAL | Status: DC
  Administered 2021-09-11: 15 mg via ORAL
  Filled 2021-09-11: qty 1

## 2021-09-11 MED ORDER — HYDROXYZINE HCL 25 MG PO TABS
25.0000 mg | ORAL_TABLET | Freq: Three times a day (TID) | ORAL | Status: DC | PRN
  Administered 2021-09-11: 25 mg via ORAL
  Filled 2021-09-11: qty 1

## 2021-09-11 MED ORDER — TRAZODONE HCL 50 MG PO TABS
50.0000 mg | ORAL_TABLET | Freq: Every evening | ORAL | Status: DC | PRN
  Administered 2021-09-11 – 2021-09-12 (×2): 50 mg via ORAL
  Filled 2021-09-11 (×2): qty 1

## 2021-09-11 MED ORDER — AMLODIPINE BESYLATE 10 MG PO TABS
10.0000 mg | ORAL_TABLET | Freq: Once | ORAL | Status: AC
  Administered 2021-09-11: 10 mg via ORAL
  Filled 2021-09-11: qty 1

## 2021-09-11 NOTE — ED Notes (Signed)
Pt laying in bed calm and cooperative. Pt has flat affect and sad face. He is cooperative in answering questions. Pt A&O x 4. Will continue to monitor for safety

## 2021-09-11 NOTE — ED Notes (Signed)
Patient resting quietly in bed with eyes closed, Respirations equal and unlabored, skin warm and dry, NAD. No change in assessment or acuity. Q 15 minute safety checks remain in place.   

## 2021-09-11 NOTE — ED Notes (Signed)
Gave Pt. Food and drink.

## 2021-09-11 NOTE — ED Notes (Signed)
No change in patient acuity at this time.. Patient denies any physical complaints when asked. No acute distress noted. Support and encouragement provided. Routine safety checks conducted according to facility protocol. Encouraged patient to notify staff if thoughts of harm toward self or others arise. Patient verbalize understanding and agreement. Patient informed of needing an urine sample and given a PHQ 9 to fill out. Will continue to monitor for safety.

## 2021-09-11 NOTE — ED Notes (Signed)
Pt sleeping@this time. Breathing even and unlabored. Will continue to monitor for safety 

## 2021-09-11 NOTE — ED Notes (Addendum)
Pt returned back to [facility] after being medically cleared at ED. Pt admitted to [facility] due to SI without plan/intent and depression. Pt A&O x4, calm and cooperative. Denies current SI/HI/AVH. Pt tolerated skin assessment well. Pt ambulated independently to unit. Oriented to unit/staff. MHT provided salad and juice per pt request. No signs of acute distress noted. Will continue to monitor for safety. ED provided paper prescription (placed in pt's chart) for Robaxin. Ileene Musa, PA made aware.

## 2021-09-11 NOTE — ED Provider Notes (Signed)
Behavioral Health Progress Note  Date and Time: 09/11/2021 11:46 AM Name: Casey Silva MRN:  [id number]  Subjective:  Casey Silva 66 y.o., male patient presented to [facility] on 09/10/2021 with complaints of chronic SI and increased depression.  He was admitted to the [facility]  Shortly after being admitted patient was complaining of back pain and difficulty urinating.  He was sent to the [facility] emergency department for medical clearance.  He was prescribed Robaxin 500 mg twice daily and was transferred back to the [facility].  Casey Silva, 66 y.o., male patient seen face to face by this provider, consulted with Dr. Dwyane Dee; and chart reviewed on 09/11/21.  Per chart review patient has a psychiatric history of bipolar 1 disorder, MDD with psychotic features, psychosis, GAD, and suicidal ideation.  Patient was psychiatrically admitted to [facility] medical from 06/23/2021-08/03/2021.  He received 10 treatments of bilateral ECT during that admission.  He was admitted to [facility] from 2/2 11/2021-06/23/2021, 11/23/2020-12/02/2020 and 09/24/2019-09/26/2019.   On evaluation Casey Silva reports he lives alone and it has become difficult for him to take care of himself.  He reports difficulty remembering simple things such as when to take his medications.  He states he has too many medications and they are due at different times and it is too much for him to remember.  He became frustrated and stopped taking the medications altogether 4-5 days ago.  He has not followed up with an outpatient provider since his discharge from [place].  He has no psychiatric outpatient services in place.  He denies any alcohol or substance use.   During evaluation Casey Silva is laying in his bed asleep.  He is easily awakened but keeps his hands over his eyes to block out the light.  He has normal speech.  He appears somewhat frustrated with questions.  He is alert/oriented x4.  He continues to endorse increased depression and anxiety.  He  contributes his declining health and memory issues as his main Psychologist, clinical.  He endorses suicidal ideations without any intent, plan, or access to means.  He cannot contract for safety at this time.  Reports since he stopped taking his medications 4-5 days ago his SI has increased.  He denies HI and AVH.  Reports he has had psychotic symptoms in the past but denies any at this time.  He does not appear to be responding to internal/external stimuli.  He is able to converse coherently, he has no distractibility or internal preoccupation.   Patient continues to endorse back pain.  Lidocaine patch x2 daily was ordered.  Robaxin 500 mg twice daily (per [facility] ED).  Discussed patient's home medications and they were restarted.  Lithium level on admission is 0.06.  Potassium on admission 3.1.  Potassium 20 mg x 2 doses ordered.  CMP will be repeated in the a.m.Marland Kitchen UDS not completed on admission, it was ordered during this encounter.    Diagnosis:  Final diagnoses:  Severe episode of recurrent major depressive disorder, with psychotic features ([place])  Generalized anxiety disorder    Total Time spent with patient: 30 minutes  Past Psychiatric History: see h&p Past Medical History:  Past Medical History:  Diagnosis Date   Anxiety    Asthma    Depression    No past surgical history on file. Family History:  Family History  Problem Relation Age of Onset   Bipolar disorder Mother    Family Psychiatric  History: see h&p Social History:  Social History  Substance and Sexual Activity  Alcohol Use No     Social History   Substance and Sexual Activity  Drug Use No    Social History   Socioeconomic History   Marital status: Single    Spouse name: Not on file   Number of children: 1   Years of education: Not on file   Highest education level: GED or equivalent  Occupational History   Not on file  Tobacco Use   Smoking status: Never   Smokeless tobacco: Never  Vaping Use   Vaping Use:  Unknown  Substance and Sexual Activity   Alcohol use: No   Drug use: No   Sexual activity: Not Currently  Other Topics Concern   Not on file  Social History Narrative   Marital status:  Single   Children: Daughter   Employment:  Unemployment. previous work for [employer].   Alcohol:  None   Drugs:  none   Exercise:  Regularly very   Religion:  Jewish   Social Determinants of Radio broadcast assistant Strain: Not on file  Food Insecurity: Not on file  Transportation Needs: Not on file  Physical Activity: Not on file  Stress: Not on file  Social Connections: Not on file   SDOH:  SDOH Screenings   Alcohol Screen: Low Risk    Last Alcohol Screening Score (AUDIT): 0  Depression (PHQ2-9): Medium Risk   PHQ-2 Score: 19  Financial Resource Strain: Not on file  Food Insecurity: Not on file  Housing: Not on file  Physical Activity: Not on file  Social Connections: Not on file  Stress: Not on file  Tobacco Use: Low Risk    Smoking Tobacco Use: Never   Smokeless Tobacco Use: Never   Passive Exposure: Not on file  Transportation Needs: Not on file   Additional Social History:                         Sleep: Fair  Appetite:  Fair  Current Medications:  Current Facility-Administered Medications  Medication Dose Route Frequency Provider Last Rate Last Admin   acetaminophen (TYLENOL) tablet 650 mg  650 mg Oral Q6H PRN Nwoko, Uchenna E, PA       alum & mag hydroxide-simeth (MAALOX/MYLANTA) 200-200-20 MG/5ML suspension 30 mL  30 mL Oral Q4H PRN Nwoko, Uchenna E, PA       hydrOXYzine (ATARAX) tablet 25 mg  25 mg Oral TID PRN Nwoko, Uchenna E, PA   25 mg at 09/11/21 0043   lidocaine (LIDODERM) 5 % 2 patch  2 patch Transdermal Once Revonda Humphrey, NP   2 patch at 09/11/21 1000   lithium carbonate (ESKALITH) CR tablet 450 mg  450 mg Oral Q12H Revonda Humphrey, NP   450 mg at 09/11/21 0959   magnesium hydroxide (MILK OF MAGNESIA) suspension 30 mL  30 mL Oral Daily  PRN Nwoko, Uchenna E, PA       methocarbamol (ROBAXIN) tablet 500 mg  500 mg Oral BID Revonda Humphrey, NP   500 mg at 09/11/21 0959   mirtazapine (REMERON) tablet 15 mg  15 mg Oral QHS Revonda Humphrey, NP       potassium chloride SA (KLOR-CON M) CR tablet 20 mEq  20 mEq Oral BID Revonda Humphrey, NP   20 mEq at 09/11/21 0959   QUEtiapine (SEROQUEL) tablet 100 mg  100 mg Oral QHS Revonda Humphrey, NP  traZODone (DESYREL) tablet 50 mg  50 mg Oral QHS PRN Nwoko, Uchenna E, PA   50 mg at 09/11/21 0043   Current Outpatient Medications  Medication Sig Dispense Refill   methocarbamol (ROBAXIN) 500 MG tablet Take 1 tablet (500 mg total) by mouth 2 (two) times daily for 5 days. 10 tablet 0    Labs  Lab Results:  Admission on 09/10/2021, Discharged on 09/10/2021  Component Date Value Ref Range Status   Color, Urine 09/10/2021 STRAW (A)  YELLOW Final   APPearance 09/10/2021 CLEAR  CLEAR Final   Specific Gravity, Urine 09/10/2021 1.008  1.005 - 1.030 Final   pH 09/10/2021 5.0  5.0 - 8.0 Final   Glucose, UA 09/10/2021 NEGATIVE  NEGATIVE mg/dL Final   Hgb urine dipstick 09/10/2021 MODERATE (A)  NEGATIVE Final   Bilirubin Urine 09/10/2021 NEGATIVE  NEGATIVE Final   Ketones, ur 09/10/2021 NEGATIVE  NEGATIVE mg/dL Final   Protein, ur 09/10/2021 NEGATIVE  NEGATIVE mg/dL Final   Nitrite 09/10/2021 NEGATIVE  NEGATIVE Final   Leukocytes,Ua 09/10/2021 NEGATIVE  NEGATIVE Final   RBC / HPF 09/10/2021 0-5  0 - 5 RBC/hpf Final   WBC, UA 09/10/2021 0-5  0 - 5 WBC/hpf Final   Bacteria, UA 09/10/2021 RARE (A)  NONE SEEN Final   Performed at [facility], [place] [address] [place],  [address]  Admission on 09/10/2021, Discharged on 09/10/2021  Component Date Value Ref Range Status   SARS Coronavirus 2 by RT PCR 09/10/2021 NEGATIVE  NEGATIVE Final   Comment: (NOTE) SARS-CoV-2 target nucleic acids are NOT DETECTED.  The SARS-CoV-2 RNA is generally detectable in upper  respiratory specimens during the acute phase of infection. The lowest concentration of SARS-CoV-2 viral copies this assay can detect is 138 copies/mL. A negative result does not preclude SARS-Cov-2 infection and should not be used as the sole basis for treatment or other patient management decisions. A negative result may occur with  improper specimen collection/handling, submission of specimen other than nasopharyngeal swab, presence of viral mutation(s) within the areas targeted by this assay, and inadequate number of viral copies(<138 copies/mL). A negative result must be combined with clinical observations, patient history, and epidemiological information. The expected result is Negative.  Fact Sheet for Patients:  EntrepreneurPulse.com.au  Fact Sheet for Healthcare Providers:  IncredibleEmployment.be  This test is no                          t yet approved or cleared by the [place] FDA and  has been authorized for detection and/or diagnosis of SARS-CoV-2 by FDA under an Emergency Use Authorization (EUA). This EUA will remain  in effect (meaning this test can be used) for the duration of the COVID-19 declaration under Section 564(b)(1) of the Act, 21 U.S.C.section 360bbb-3(b)(1), unless the authorization is terminated  or revoked sooner.       Influenza A by PCR 09/10/2021 NEGATIVE  NEGATIVE Final   Influenza B by PCR 09/10/2021 NEGATIVE  NEGATIVE Final   Comment: (NOTE) The Xpert Xpress SARS-CoV-2/FLU/RSV plus assay is intended as an aid in the diagnosis of influenza from Nasopharyngeal swab specimens and should not be used as a sole basis for treatment. Nasal washings and aspirates are unacceptable for Xpert Xpress SARS-CoV-2/FLU/RSV testing.  Fact Sheet for Patients: EntrepreneurPulse.com.au  Fact Sheet for Healthcare Providers: IncredibleEmployment.be  This test is not yet approved or  cleared by the [place] and has been authorized for  detection and/or diagnosis of SARS-CoV-2 by FDA under an Emergency Use Authorization (EUA). This EUA will remain in effect (meaning this test can be used) for the duration of the COVID-19 declaration under Section 564(b)(1) of the Act, 21 U.S.C. section 360bbb-3(b)(1), unless the authorization is terminated or revoked.  Performed at [facility], [place] [address], [place], [place] [address]    WBC 09/10/2021 6.1  4.0 - 10.5 K/uL Final   RBC 09/10/2021 4.81  4.22 - 5.81 MIL/uL Final   Hemoglobin 09/10/2021 14.5  13.0 - 17.0 g/dL Final   HCT 09/10/2021 42.1  39.0 - 52.0 % Final   MCV 09/10/2021 87.5  80.0 - 100.0 fL Final   MCH 09/10/2021 30.1  26.0 - 34.0 pg Final   MCHC 09/10/2021 34.4  30.0 - 36.0 g/dL Final   RDW 09/10/2021 14.7  11.5 - 15.5 % Final   Platelets 09/10/2021 275  150 - 400 K/uL Final   nRBC 09/10/2021 0.0  0.0 - 0.2 % Final   Neutrophils Relative % 09/10/2021 54  % Final   Neutro Abs 09/10/2021 3.3  1.7 - 7.7 K/uL Final   Lymphocytes Relative 09/10/2021 32  % Final   Lymphs Abs 09/10/2021 1.9  0.7 - 4.0 K/uL Final   Monocytes Relative 09/10/2021 8  % Final   Monocytes Absolute 09/10/2021 0.5  0.1 - 1.0 K/uL Final   Eosinophils Relative 09/10/2021 5  % Final   Eosinophils Absolute 09/10/2021 0.3  0.0 - 0.5 K/uL Final   Basophils Relative 09/10/2021 1  % Final   Basophils Absolute 09/10/2021 0.1  0.0 - 0.1 K/uL Final   Immature Granulocytes 09/10/2021 0  % Final   Abs Immature Granulocytes 09/10/2021 0.02  0.00 - 0.07 K/uL Final   Performed at [facility], [place] [address] [place], [place] [address]   Sodium 09/10/2021 137  135 - 145 mmol/L Final   Potassium 09/10/2021 3.1 (L)  3.5 - 5.1 mmol/L Final   Chloride 09/10/2021 103  98 - 111 mmol/L Final   CO2 09/10/2021 23  22 - 32 mmol/L Final   Glucose, Bld 09/10/2021 119 (H)  70 - 99 mg/dL Final   Glucose reference range applies only to samples  taken after fasting for at least 8 hours.   BUN 09/10/2021 15  8 - 23 mg/dL Final   Creatinine, Ser 09/10/2021 1.04  0.61 - 1.24 mg/dL Final   Calcium 09/10/2021 8.9  8.9 - 10.3 mg/dL Final   Total Protein 09/10/2021 6.9  6.5 - 8.1 g/dL Final   Albumin 09/10/2021 4.0  3.5 - 5.0 g/dL Final   AST 09/10/2021 16  15 - 41 U/L Final   ALT 09/10/2021 14  0 - 44 U/L Final   Alkaline Phosphatase 09/10/2021 57  38 - 126 U/L Final   Total Bilirubin 09/10/2021 0.4  0.3 - 1.2 mg/dL Final   GFR, Estimated 09/10/2021 >60  >60 mL/min Final   Comment: (NOTE) Calculated using the CKD-EPI Creatinine Equation (2021)    Anion gap 09/10/2021 11  5 - 15 Final   Performed at [place] [address] [place], [place] [address]   TSH 09/10/2021 1.983  0.350 - 4.500 uIU/mL Final   Comment: Performed by a 3rd Generation assay with a functional sensitivity of <=0.01 uIU/mL. Performed at [facility], [place] [address], [place], [place] [address]    Lithium Lvl 09/10/2021 <0.06 (L)  0.60 - 1.20 mmol/L Final  Performed at [facility], [place] [address], [place], [place] [address]  Admission on 06/23/2021, Discharged on 08/03/2021  Component Date Value Ref Range Status   Glucose-Capillary 06/27/2021 85  70 - 99 mg/dL Final   Glucose reference range applies only to samples taken after fasting for at least 8 hours.   Comment 1 06/27/2021 Notify RN   Final   Glucose-Capillary 06/29/2021 88  70 - 99 mg/dL Final   Glucose reference range applies only to samples taken after fasting for at least 8 hours.   Glucose-Capillary 06/29/2021 96  70 - 99 mg/dL Final   Glucose reference range applies only to samples taken after fasting for at least 8 hours.   Adenovirus 06/30/2021 NOT DETECTED  NOT DETECTED Final   Coronavirus 229E 06/30/2021 NOT DETECTED  NOT DETECTED Final   Comment: (NOTE) The Coronavirus on the Respiratory Panel, DOES NOT test for the novel  Coronavirus (2019 nCoV)    Coronavirus HKU1  06/30/2021 NOT DETECTED  NOT DETECTED Final   Coronavirus NL63 06/30/2021 NOT DETECTED  NOT DETECTED Final   Coronavirus OC43 06/30/2021 NOT DETECTED  NOT DETECTED Final   Metapneumovirus 06/30/2021 NOT DETECTED  NOT DETECTED Final   Rhinovirus / Enterovirus 06/30/2021 NOT DETECTED  NOT DETECTED Final   Influenza A 06/30/2021 NOT DETECTED  NOT DETECTED Final   Influenza B 06/30/2021 NOT DETECTED  NOT DETECTED Final   Parainfluenza Virus 1 06/30/2021 NOT DETECTED  NOT DETECTED Final   Parainfluenza Virus 2 06/30/2021 NOT DETECTED  NOT DETECTED Final   Parainfluenza Virus 3 06/30/2021 NOT DETECTED  NOT DETECTED Final   Parainfluenza Virus 4 06/30/2021 NOT DETECTED  NOT DETECTED Final   Respiratory Syncytial Virus 06/30/2021 NOT DETECTED  NOT DETECTED Final   Bordetella pertussis 06/30/2021 NOT DETECTED  NOT DETECTED Final   Bordetella Parapertussis 06/30/2021 NOT DETECTED  NOT DETECTED Final   Chlamydophila pneumoniae 06/30/2021 NOT DETECTED  NOT DETECTED Final   Mycoplasma pneumoniae 06/30/2021 NOT DETECTED  NOT DETECTED Final   Performed at [facility], [place] [address], [place], [place] [address]   HIV Screen 4th Generation wRfx 06/30/2021 Non Reactive  Non Reactive Final   Performed at [facility], [place] [address] [place], [place] [address]   SARS Coronavirus 2 by RT PCR 06/30/2021 NEGATIVE  NEGATIVE Final   Comment: (NOTE) SARS-CoV-2 target nucleic acids are NOT DETECTED.  The SARS-CoV-2 RNA is generally detectable in upper respiratory specimens during the acute phase of infection. The lowest concentration of SARS-CoV-2 viral copies this assay can detect is 138 copies/mL. A negative result does not preclude SARS-Cov-2 infection and should not be used as the sole basis for treatment or other patient management decisions. A negative result may occur with  improper specimen collection/handling, submission of specimen other than nasopharyngeal swab, presence of viral  mutation(s) within the areas targeted by this assay, and inadequate number of viral copies(<138 copies/mL). A negative result must be combined with clinical observations, patient history, and epidemiological information. The expected result is Negative.  Fact Sheet for Patients:  EntrepreneurPulse.com.au  Fact Sheet for Healthcare Providers:  IncredibleEmployment.be  This test is no                          t yet approved or cleared by the [place] FDA and  has been authorized for detection and/or diagnosis of SARS-CoV-2 by FDA under an Emergency Use Authorization (EUA). This EUA will  remain  in effect (meaning this test can be used) for the duration of the COVID-19 declaration under Section 564(b)(1) of the Act, 21 U.S.C.section 360bbb-3(b)(1), unless the authorization is terminated  or revoked sooner.       Influenza A by PCR 06/30/2021 NEGATIVE  NEGATIVE Final   Influenza B by PCR 06/30/2021 NEGATIVE  NEGATIVE Final   Comment: (NOTE) The Xpert Xpress SARS-CoV-2/FLU/RSV plus assay is intended as an aid in the diagnosis of influenza from Nasopharyngeal swab specimens and should not be used as a sole basis for treatment. Nasal washings and aspirates are unacceptable for Xpert Xpress SARS-CoV-2/FLU/RSV testing.  Fact Sheet for Patients: EntrepreneurPulse.com.au  Fact Sheet for Healthcare Providers: IncredibleEmployment.be  This test is not yet approved or cleared by the [place] FDA and has been authorized for detection and/or diagnosis of SARS-CoV-2 by FDA under an Emergency Use Authorization (EUA). This EUA will remain in effect (meaning this test can be used) for the duration of the COVID-19 declaration under Section 564(b)(1) of the Act, 21 U.S.C. section 360bbb-3(b)(1), unless the authorization is terminated or revoked.  Performed at [facility], [place]  [place], [place] [address]    Campylobacter species 06/30/2021 NOT DETECTED  NOT DETECTED Final   Plesimonas shigelloides 06/30/2021 NOT DETECTED  NOT DETECTED Final   Salmonella species 06/30/2021 NOT DETECTED  NOT DETECTED Final   Yersinia enterocolitica 06/30/2021 NOT DETECTED  NOT DETECTED Final   Vibrio species 06/30/2021 NOT DETECTED  NOT DETECTED Final   Vibrio cholerae 06/30/2021 NOT DETECTED  NOT DETECTED Final   Enteroaggregative E coli (EAEC) 06/30/2021 NOT DETECTED  NOT DETECTED Final   Enteropathogenic E coli (EPEC) 06/30/2021 NOT DETECTED  NOT DETECTED Final   Enterotoxigenic E coli (ETEC) 06/30/2021 NOT DETECTED  NOT DETECTED Final   Shiga like toxin producing E coli * 06/30/2021 NOT DETECTED  NOT DETECTED Final   Shigella/Enteroinvasive E coli (EI* 06/30/2021 NOT DETECTED  NOT DETECTED Final   Cryptosporidium 06/30/2021 NOT DETECTED  NOT DETECTED Final   Cyclospora cayetanensis 06/30/2021 NOT DETECTED  NOT DETECTED Final   Entamoeba histolytica 06/30/2021 NOT DETECTED  NOT DETECTED Final   Giardia lamblia 06/30/2021 NOT DETECTED  NOT DETECTED Final   Adenovirus F40/41 06/30/2021 NOT DETECTED  NOT DETECTED Final   Astrovirus 06/30/2021 NOT DETECTED  NOT DETECTED Final   Norovirus GI/GII 06/30/2021 NOT DETECTED  NOT DETECTED Final   Rotavirus A 06/30/2021 NOT DETECTED  NOT DETECTED Final   Sapovirus (I, II, IV, and V) 06/30/2021 NOT DETECTED  NOT DETECTED Final   Performed at [facility], [place]., [place], [place] [address]   Glucose-Capillary 07/01/2021 82  70 - 99 mg/dL Final   Glucose reference range applies only to samples taken after fasting for at least 8 hours.   Glucose-Capillary 07/04/2021 105 (H)  70 - 99 mg/dL Final   Glucose reference range applies only to samples taken after fasting for at least 8 hours.   Comment 1 07/04/2021 Notify RN   Final   Glucose-Capillary 07/06/2021 90  70 - 99 mg/dL Final   Glucose reference range applies only to samples  taken after fasting for at least 8 hours.   Glucose-Capillary 07/08/2021 84  70 - 99 mg/dL Final   Glucose reference range applies only to samples taken after fasting for at least 8 hours.   Glucose-Capillary 07/11/2021 81  70 - 99 mg/dL Final   Glucose reference range applies only to samples taken after fasting for  at least 8 hours.   Glucose-Capillary 07/11/2021 91  70 - 99 mg/dL Final   Glucose reference range applies only to samples taken after fasting for at least 8 hours.   Glucose-Capillary 07/13/2021 87  70 - 99 mg/dL Final   Glucose reference range applies only to samples taken after fasting for at least 8 hours.   Glucose-Capillary 07/15/2021 78  70 - 99 mg/dL Final   Glucose reference range applies only to samples taken after fasting for at least 8 hours.   Glucose-Capillary 07/18/2021 92  70 - 99 mg/dL Final   Glucose reference range applies only to samples taken after fasting for at least 8 hours.   TSH 07/26/2021 1.361  0.350 - 4.500 uIU/mL Final   Comment: Performed by a 3rd Generation assay with a functional sensitivity of <=0.01 uIU/mL. Performed at [facility], [place], [place] [address]    Sodium 07/26/2021 137  135 - 145 mmol/L Final   Potassium 07/26/2021 4.1  3.5 - 5.1 mmol/L Final   Chloride 07/26/2021 100  98 - 111 mmol/L Final   CO2 07/26/2021 27  22 - 32 mmol/L Final   Glucose, Bld 07/26/2021 131 (H)  70 - 99 mg/dL Final   Glucose reference range applies only to samples taken after fasting for at least 8 hours.   BUN 07/26/2021 21  8 - 23 mg/dL Final   Creatinine, Ser 07/26/2021 1.06  0.61 - 1.24 mg/dL Final   Calcium 07/26/2021 9.0  8.9 - 10.3 mg/dL Final   GFR, Estimated 07/26/2021 >60  >60 mL/min Final   Comment: (NOTE) Calculated using the CKD-EPI Creatinine Equation (2021)    Anion gap 07/26/2021 10  5 - 15 Final   Performed at [facility], [place], [place] [address]   Lithium Lvl 07/30/2021 0.48 (L)   0.60 - 1.20 mmol/L Final   Performed at [facility], [place] [place], [place] [address]  Admission on 06/07/2021, Discharged on 06/23/2021  Component Date Value Ref Range Status   Sodium 06/09/2021 130 (L)  135 - 145 mmol/L Final   Potassium 06/09/2021 3.7  3.5 - 5.1 mmol/L Final   Chloride 06/09/2021 95 (L)  98 - 111 mmol/L Final   CO2 06/09/2021 23  22 - 32 mmol/L Final   Glucose, Bld 06/09/2021 103 (H)  70 - 99 mg/dL Final   Glucose reference range applies only to samples taken after fasting for at least 8 hours.   BUN 06/09/2021 10  8 - 23 mg/dL Final   Creatinine, Ser 06/09/2021 0.89  0.61 - 1.24 mg/dL Final   Calcium 06/09/2021 9.8  8.9 - 10.3 mg/dL Final   GFR, Estimated 06/09/2021 >60  >60 mL/min Final   Comment: (NOTE) Calculated using the CKD-EPI Creatinine Equation (2021)    Anion gap 06/09/2021 12  5 - 15 Final   Performed at [facility], [place] [address] [place],  [address]   Vitamin B-12 06/10/2021 325  180 - 914 pg/mL Final   Comment: (NOTE) This assay is not validated for testing neonatal or myeloproliferative syndrome specimens for Vitamin B12 levels. Performed at [facility], [place] [address] [place], [place] [address]    Vit D, 25-Hydroxy 06/10/2021 14.94 (L)  30 - 100 ng/mL Final   Comment: (NOTE) Vitamin D deficiency has been defined by the [place] practice guideline as a level of serum 25-OH  vitamin D less than 20 ng/mL (1,2). The Endocrine Society went on to  further define vitamin D insufficiency as a level between 21 and 29  ng/mL (2).  1. IOM (Institute of Medicine). 2010. Dietary reference intakes for  calcium and D. [place]: The [employer]. 2. Holick MF, Binkley [place], Bischoff-Ferrari HA, et al. Evaluation,  treatment, and prevention of vitamin D deficiency: an Endocrine  Society clinical practice guideline, JCEM. 2011 Jul; 96(7):  1911-30.  Performed at [facility], [place] [address], [place], [place] [address]    Folate 06/10/2021 9.8  >5.9 ng/mL Final   Performed at [facility], [place] [address] [place], [place] [address]   Vitamin B1 (Thiamine) 06/10/2021 121.6  66.5 - 200.0 nmol/L Final   Comment: (NOTE) This test was developed and its performance characteristics determined by [facility]. It has not been cleared or approved by the Food and Drug Administration. Performed At: [facility] [place], [place] [id number] Rush Farmer MD QP:[id number]    Sodium 06/12/2021 133 (L)  135 - 145 mmol/L Final   Potassium 06/12/2021 3.6  3.5 - 5.1 mmol/L Final   Chloride 06/12/2021 99  98 - 111 mmol/L Final   CO2 06/12/2021 28  22 - 32 mmol/L Final   Glucose, Bld 06/12/2021 94  70 - 99 mg/dL Final   Glucose reference range applies only to samples taken after fasting for at least 8 hours.   BUN 06/12/2021 14  8 - 23 mg/dL Final   Creatinine, Ser 06/12/2021 1.03  0.61 - 1.24 mg/dL Final   Calcium 06/12/2021 8.9  8.9 - 10.3 mg/dL Final   GFR, Estimated 06/12/2021 >60  >60 mL/min Final   Comment: (NOTE) Calculated using the CKD-EPI Creatinine Equation (2021)    Anion gap 06/12/2021 6  5 - 15 Final   Performed at [facility], [place] [address] [place], [place] [address]   Sodium 06/16/2021 135  135 - 145 mmol/L Final   Potassium 06/16/2021 3.6  3.5 - 5.1 mmol/L Final   Chloride 06/16/2021 102  98 - 111 mmol/L Final   CO2 06/16/2021 24  22 - 32 mmol/L Final   Glucose, Bld 06/16/2021 84  70 - 99 mg/dL Final   Glucose reference range applies only to samples taken after fasting for at least 8 hours.   BUN 06/16/2021 16  8 - 23 mg/dL Final   Creatinine, Ser 06/16/2021 0.86  0.61 - 1.24 mg/dL Final   Calcium 06/16/2021 8.9  8.9 - 10.3 mg/dL Final   Total Protein 06/16/2021 6.5  6.5 - 8.1 g/dL Final   Albumin 06/16/2021 3.7  3.5 - 5.0 g/dL Final   AST 06/16/2021  18  15 - 41 U/L Final   ALT 06/16/2021 20  0 - 44 U/L Final   Alkaline Phosphatase 06/16/2021 53  38 - 126 U/L Final   Total Bilirubin 06/16/2021 0.3  0.3 - 1.2 mg/dL Final   GFR, Estimated 06/16/2021 >60  >60 mL/min Final   Comment: (NOTE) Calculated using the CKD-EPI Creatinine Equation (2021)    Anion gap 06/16/2021 9  5 - 15 Final   Performed at [facility] [place] [address] [place], [place] [address]   SARS Coronavirus 2 by RT PCR 06/21/2021 NEGATIVE  NEGATIVE Final   Comment: (NOTE) SARS-CoV-2 target nucleic acids are NOT DETECTED.  The SARS-CoV-2 RNA is generally detectable in upper respiratory specimens during the acute phase of infection. The  lowest concentration of SARS-CoV-2 viral copies this assay can detect is 138 copies/mL. A negative result does not preclude SARS-Cov-2 infection and should not be used as the sole basis for treatment or other patient management decisions. A negative result may occur with  improper specimen collection/handling, submission of specimen other than nasopharyngeal swab, presence of viral mutation(s) within the areas targeted by this assay, and inadequate number of viral copies(<138 copies/mL). A negative result must be combined with clinical observations, patient history, and epidemiological information. The expected result is Negative.  Fact Sheet for Patients:  EntrepreneurPulse.com.au  Fact Sheet for Healthcare Providers:  IncredibleEmployment.be  This test is no                          t yet approved or cleared by the [place] FDA and  has been authorized for detection and/or diagnosis of SARS-CoV-2 by FDA under an Emergency Use Authorization (EUA). This EUA will remain  in effect (meaning this test can be used) for the duration of the COVID-19 declaration under Section 564(b)(1) of the Act, 21 U.S.C.section 360bbb-3(b)(1), unless the authorization is terminated  or  revoked sooner.       Influenza A by PCR 06/21/2021 NEGATIVE  NEGATIVE Final   Influenza B by PCR 06/21/2021 NEGATIVE  NEGATIVE Final   Comment: (NOTE) The Xpert Xpress SARS-CoV-2/FLU/RSV plus assay is intended as an aid in the diagnosis of influenza from Nasopharyngeal swab specimens and should not be used as a sole basis for treatment. Nasal washings and aspirates are unacceptable for Xpert Xpress SARS-CoV-2/FLU/RSV testing.  Fact Sheet for Patients: EntrepreneurPulse.com.au  Fact Sheet for Healthcare Providers: IncredibleEmployment.be  This test is not yet approved or cleared by the [place] FDA and has been authorized for detection and/or diagnosis of SARS-CoV-2 by FDA under an Emergency Use Authorization (EUA). This EUA will remain in effect (meaning this test can be used) for the duration of the COVID-19 declaration under Section 564(b)(1) of the Act, 21 U.S.C. section 360bbb-3(b)(1), unless the authorization is terminated or revoked.  Performed at [facility], [place] [address] [place], [place] [address]    Sodium 06/23/2021 137  135 - 145 mmol/L Final   Potassium 06/23/2021 4.0  3.5 - 5.1 mmol/L Final   Chloride 06/23/2021 103  98 - 111 mmol/L Final   CO2 06/23/2021 28  22 - 32 mmol/L Final   Glucose, Bld 06/23/2021 85  70 - 99 mg/dL Final   Glucose reference range applies only to samples taken after fasting for at least 8 hours.   BUN 06/23/2021 14  8 - 23 mg/dL Final   Creatinine, Ser 06/23/2021 0.85  0.61 - 1.24 mg/dL Final   Calcium 06/23/2021 9.1  8.9 - 10.3 mg/dL Final   GFR, Estimated 06/23/2021 >60  >60 mL/min Final   Comment: (NOTE) Calculated using the CKD-EPI Creatinine Equation (2021)    Anion gap 06/23/2021 6  5 - 15 Final   Performed at [facility], [place] [address], [place], [place] [address]   SARS Coronavirus 2 by RT PCR 06/23/2021 NEGATIVE  NEGATIVE Final   Comment:  (NOTE) SARS-CoV-2 target nucleic acids are NOT DETECTED.  The SARS-CoV-2 RNA is generally detectable in upper respiratory specimens during the acute phase of infection. The lowest concentration of SARS-CoV-2 viral copies this assay can detect is 138 copies/mL. A negative result does not preclude SARS-Cov-2 infection and should not be used as the sole basis  for treatment or other patient management decisions. A negative result may occur with  improper specimen collection/handling, submission of specimen other than nasopharyngeal swab, presence of viral mutation(s) within the areas targeted by this assay, and inadequate number of viral copies(<138 copies/mL). A negative result must be combined with clinical observations, patient history, and epidemiological information. The expected result is Negative.  Fact Sheet for Patients:  EntrepreneurPulse.com.au  Fact Sheet for Healthcare Providers:  IncredibleEmployment.be  This test is no                          t yet approved or cleared by the [place] FDA and  has been authorized for detection and/or diagnosis of SARS-CoV-2 by FDA under an Emergency Use Authorization (EUA). This EUA will remain  in effect (meaning this test can be used) for the duration of the COVID-19 declaration under Section 564(b)(1) of the Act, 21 U.S.C.section 360bbb-3(b)(1), unless the authorization is terminated  or revoked sooner.       Influenza A by PCR 06/23/2021 NEGATIVE  NEGATIVE Final   Influenza B by PCR 06/23/2021 NEGATIVE  NEGATIVE Final   Comment: (NOTE) The Xpert Xpress SARS-CoV-2/FLU/RSV plus assay is intended as an aid in the diagnosis of influenza from Nasopharyngeal swab specimens and should not be used as a sole basis for treatment. Nasal washings and aspirates are unacceptable for Xpert Xpress SARS-CoV-2/FLU/RSV testing.  Fact Sheet for Patients: EntrepreneurPulse.com.au  Fact  Sheet for Healthcare Providers: IncredibleEmployment.be  This test is not yet approved or cleared by the [place] FDA and has been authorized for detection and/or diagnosis of SARS-CoV-2 by FDA under an Emergency Use Authorization (EUA). This EUA will remain in effect (meaning this test can be used) for the duration of the COVID-19 declaration under Section 564(b)(1) of the Act, 21 U.S.C. section 360bbb-3(b)(1), unless the authorization is terminated or revoked.  Performed at [facility], [place] [address] [place], [place] [address]   Admission on 06/07/2021, Discharged on 06/07/2021  Component Date Value Ref Range Status   Sodium 06/07/2021 128 (L)  135 - 145 mmol/L Final   Potassium 06/07/2021 3.6  3.5 - 5.1 mmol/L Final   Chloride 06/07/2021 93 (L)  98 - 111 mmol/L Final   CO2 06/07/2021 21 (L)  22 - 32 mmol/L Final   Glucose, Bld 06/07/2021 110 (H)  70 - 99 mg/dL Final   Glucose reference range applies only to samples taken after fasting for at least 8 hours.   BUN 06/07/2021 10  8 - 23 mg/dL Final   Creatinine, Ser 06/07/2021 1.24  0.61 - 1.24 mg/dL Final   Calcium 06/07/2021 9.3  8.9 - 10.3 mg/dL Final   Total Protein 06/07/2021 7.5  6.5 - 8.1 g/dL Final   Albumin 06/07/2021 4.5  3.5 - 5.0 g/dL Final   AST 06/07/2021 22  15 - 41 U/L Final   ALT 06/07/2021 12  0 - 44 U/L Final   Alkaline Phosphatase 06/07/2021 63  38 - 126 U/L Final   Total Bilirubin 06/07/2021 0.7  0.3 - 1.2 mg/dL Final   GFR, Estimated 06/07/2021 >60  >60 mL/min Final   Comment: (NOTE) Calculated using the CKD-EPI Creatinine Equation (2021)    Anion gap 06/07/2021 14  5 - 15 Final   Performed at [facility], [place] [address], [place], [place] [address]   Alcohol, Ethyl (B) 06/07/2021 <10  <10 mg/dL Final   Comment: (NOTE) Lowest  detectable limit for serum alcohol is 10 mg/dL.  For medical purposes only. Performed at [facility], [place] [address], [place], [place] [address]    Salicylate Lvl 10/62/6948 <7.0 (L)  7.0 - 30.0 mg/dL Final   Performed at [place] [address] [place], [place] [address]   Acetaminophen (Tylenol), Serum 06/07/2021 17  10 - 30 ug/mL Final   Comment: (NOTE) Therapeutic concentrations vary significantly. A range of 10-30 ug/mL  may be an effective concentration for many patients. However, some  are best treated at concentrations outside of this range. Acetaminophen concentrations >150 ug/mL at 4 hours after ingestion  and >50 ug/mL at 12 hours after ingestion are often associated with  toxic reactions.  Performed at [facility], [place] [address], [place], [place] [address]    WBC 06/07/2021 6.5  4.0 - 10.5 K/uL Final   RBC 06/07/2021 5.01  4.22 - 5.81 MIL/uL Final   Hemoglobin 06/07/2021 14.0  13.0 - 17.0 g/dL Final   HCT 06/07/2021 41.3  39.0 - 52.0 % Final   MCV 06/07/2021 82.4  80.0 - 100.0 fL Final   MCH 06/07/2021 27.9  26.0 - 34.0 pg Final   MCHC 06/07/2021 33.9  30.0 - 36.0 g/dL Final   RDW 06/07/2021 13.6  11.5 - 15.5 % Final   Platelets 06/07/2021 333  150 - 400 K/uL Final   nRBC 06/07/2021 0.0  0.0 - 0.2 % Final   Performed at [facility], [place] [address], [place], [place] [address]   Opiates 06/07/2021 NONE DETECTED  NONE DETECTED Final   Cocaine 06/07/2021 NONE DETECTED  NONE DETECTED Final   Benzodiazepines 06/07/2021 NONE DETECTED  NONE DETECTED Final   Amphetamines 06/07/2021 NONE DETECTED  NONE DETECTED Final   Tetrahydrocannabinol 06/07/2021 NONE DETECTED  NONE DETECTED Final   Barbiturates 06/07/2021 NONE DETECTED  NONE DETECTED Final   Comment: (NOTE) DRUG SCREEN FOR MEDICAL PURPOSES ONLY.  IF CONFIRMATION IS NEEDED FOR ANY PURPOSE, NOTIFY LAB WITHIN 5 DAYS.  LOWEST DETECTABLE LIMITS FOR URINE DRUG SCREEN Drug Class                     Cutoff (ng/mL) Amphetamine and metabolites     1000 Barbiturate and metabolites    200 Benzodiazepine                 299 Tricyclics and metabolites     300 Opiates and metabolites        300 Cocaine and metabolites        300 THC                            50 Performed at [facility], [place] [address] [place], [place] [address]    SARS Coronavirus 2 by RT PCR 06/07/2021 NEGATIVE  NEGATIVE Final   Comment: (NOTE) SARS-CoV-2 target nucleic acids are NOT DETECTED.  The SARS-CoV-2 RNA is generally detectable in upper respiratory specimens during the acute phase of infection. The lowest concentration of SARS-CoV-2 viral copies this assay can detect is 138 copies/mL. A negative result does not preclude SARS-Cov-2 infection and should not be used as the sole basis for treatment or other patient management decisions. A negative result may occur with  improper specimen collection/handling, submission of specimen other than nasopharyngeal swab, presence of viral mutation(s) within the areas targeted by this assay, and inadequate number of  viral copies(<138 copies/mL). A negative result must be combined with clinical observations, patient history, and epidemiological information. The expected result is Negative.  Fact Sheet for Patients:  EntrepreneurPulse.com.au  Fact Sheet for Healthcare Providers:  IncredibleEmployment.be  This test is no                          t yet approved or cleared by the [place] FDA and  has been authorized for detection and/or diagnosis of SARS-CoV-2 by FDA under an Emergency Use Authorization (EUA). This EUA will remain  in effect (meaning this test can be used) for the duration of the COVID-19 declaration under Section 564(b)(1) of the Act, 21 U.S.C.section 360bbb-3(b)(1), unless the authorization is terminated  or revoked sooner.       Influenza A by PCR 06/07/2021 NEGATIVE  NEGATIVE Final   Influenza B by PCR 06/07/2021 NEGATIVE   NEGATIVE Final   Comment: (NOTE) The Xpert Xpress SARS-CoV-2/FLU/RSV plus assay is intended as an aid in the diagnosis of influenza from Nasopharyngeal swab specimens and should not be used as a sole basis for treatment. Nasal washings and aspirates are unacceptable for Xpert Xpress SARS-CoV-2/FLU/RSV testing.  Fact Sheet for Patients: EntrepreneurPulse.com.au  Fact Sheet for Healthcare Providers: IncredibleEmployment.be  This test is not yet approved or cleared by the [place] FDA and has been authorized for detection and/or diagnosis of SARS-CoV-2 by FDA under an Emergency Use Authorization (EUA). This EUA will remain in effect (meaning this test can be used) for the duration of the COVID-19 declaration under Section 564(b)(1) of the Act, 21 U.S.C. section 360bbb-3(b)(1), unless the authorization is terminated or revoked.  Performed at [facility], [place] [address] [place], [place] [address]    Magnesium 06/07/2021 1.7  1.7 - 2.4 mg/dL Final   Performed at [place] [address], [place], [place] [address]   Sodium 06/07/2021 131 (L)  135 - 145 mmol/L Final   Potassium 06/07/2021 3.8  3.5 - 5.1 mmol/L Final   Chloride 06/07/2021 97 (L)  98 - 111 mmol/L Final   CO2 06/07/2021 25  22 - 32 mmol/L Final   Glucose, Bld 06/07/2021 187 (H)  70 - 99 mg/dL Final   Glucose reference range applies only to samples taken after fasting for at least 8 hours.   BUN 06/07/2021 8  8 - 23 mg/dL Final   Creatinine, Ser 06/07/2021 1.11  0.61 - 1.24 mg/dL Final   Calcium 06/07/2021 9.3  8.9 - 10.3 mg/dL Final   GFR, Estimated 06/07/2021 >60  >60 mL/min Final   Comment: (NOTE) Calculated using the CKD-EPI Creatinine Equation (2021)    Anion gap 06/07/2021 9  5 - 15 Final   Performed at [facility], [place] [address], [place], [place] [address]   Magnesium 06/07/2021 1.7  1.7 - 2.4 mg/dL Final    Performed at [place] [address], [place], [place] [address]   Cholesterol 06/07/2021 197  0 - 200 mg/dL Final   Triglycerides 06/07/2021 149  <150 mg/dL Final   HDL 06/07/2021 44  >40 mg/dL Final   Total CHOL/HDL Ratio 06/07/2021 4.5  RATIO Final   VLDL 06/07/2021 30  0 - 40 mg/dL Final   LDL Cholesterol 06/07/2021 123 (H)  0 - 99 mg/dL Final   Comment:        Total Cholesterol/HDL:CHD Risk Coronary Heart Disease Risk Table  Men   Women  1/2 Average Risk   3.4   3.3  Average Risk       5.0   4.4  2 X Average Risk   9.6   7.1  3 X Average Risk  23.4   11.0        Use the calculated Patient Ratio above and the CHD Risk Table to determine the patient's CHD Risk.        ATP III CLASSIFICATION (LDL):  <100     mg/dL   Optimal  100-129  mg/dL   Near or Above                    Optimal  130-159  mg/dL   Borderline  160-189  mg/dL   High  >190     mg/dL   Very High Performed at [place] [address], [place], [place] [address]    TSH 06/07/2021 1.328  0.350 - 4.500 uIU/mL Final   Comment: Performed by a 3rd Generation assay with a functional sensitivity of <=0.01 uIU/mL. Performed at [facility], [place] [address] [place], [place] [address]    Hgb A1c MFr Bld 06/07/2021 5.1  4.8 - 5.6 % Final   Comment: (NOTE) Pre diabetes:          5.7%-6.4%  Diabetes:              >6.4%  Glycemic control for   <7.0% adults with diabetes    Mean Plasma Glucose 06/07/2021 99.67  mg/dL Final   Performed at [facility], [place] [address] [place], [place] [address]    Blood Alcohol level:  Lab Results  Component Value Date   ETH <10 06/07/2021   ETH <10 09/73/5329    Metabolic Disorder Labs: Lab Results  Component Value Date   HGBA1C 5.1 06/07/2021   MPG 99.67 06/07/2021   MPG 111.15 11/24/2020   No results found for: PROLACTIN Lab Results  Component Value Date   CHOL 197 06/07/2021    TRIG 149 06/07/2021   HDL 44 06/07/2021   CHOLHDL 4.5 06/07/2021   VLDL 30 06/07/2021   LDLCALC 123 (H) 06/07/2021   LDLCALC 148 (H) 11/24/2020    Therapeutic Lab Levels: Lab Results  Component Value Date   LITHIUM <0.06 (L) 09/10/2021   LITHIUM 0.48 (L) 07/30/2021   No results found for: VALPROATE No components found for:  CBMZ  Physical Findings   AIMS    Flowsheet Row Admission (Discharged) from 06/07/2021 in [place] [facility]B Admission (Discharged) from 11/23/2020 in [place] [facility]  AIMS Total Score 7 0      AUDIT    Flowsheet Row Admission (Discharged) from 06/23/2021 in [place] Admission (Discharged) from 06/07/2021 in [place] 400B Admission (Discharged) from 11/23/2020 in [place] 400B Admission (Discharged) from 09/24/2019 in [place] 300B  Alcohol Use Disorder Identification Test Final Score (AUDIT) 0 0 0 0      ECT-MADRS    Flowsheet Row Admission (Discharged) from 06/23/2021 in [place] Total Score 44      GAD-7    Flowsheet Row Video Visit from 03/30/2021 in [place] from 12/28/2020 in [place] from 11/22/2020 in [facility] Video Visit from 09/20/2020 in [place]  [place] Video Visit from 06/23/2020 in [facility]  Total GAD-7 Score '14 18 20 7 8      '$ Mini-Mental    Flowsheet Row Admission (Discharged) from 06/23/2021 in [place]  Total Score (max 30 points ) 30      PHQ2-9    [place] ED from 06/07/2021 in [place] DEPT Most recent reading at 06/07/2021  4:31 AM Video Visit from 03/30/2021 in [facility] Most recent reading at 03/30/2021  2:06 PM Clinical Support from 12/28/2020 in [facility] Most recent reading at 12/28/2020 11:45 AM ED from 11/22/2020 in [facility] Most recent reading at 11/22/2020  9:59 PM Clinical Support from 11/22/2020 in [facility] Most recent reading at 11/22/2020  5:13 PM  PHQ-2 Total Score '6 5 6 6 6  '$ PHQ-9 Total Score '19 20 24 27 27      '$ Flowsheet Row ED from 09/10/2021 in [facility] Most recent reading at 09/11/2021 12:32 AM ED from 09/10/2021 in [place] Most recent reading at 09/10/2021  5:49 PM ED from 09/10/2021 in [facility] Most recent reading at 09/10/2021  4:28 PM  C-SSRS RISK CATEGORY Low Risk No Risk High Risk        Musculoskeletal  Strength & Muscle Tone: within normal limits Gait & Station: normal Patient leans: N/A  Psychiatric Specialty Exam  Presentation  General Appearance: Appropriate for Environment; Casual  Eye Contact:Fair  Speech:Normal Rate; Clear and Coherent  Speech Volume:Normal  Handedness:Right   Mood and Affect  Mood:Anxious; Depressed  Affect:Congruent   Thought Process  Thought Processes:Coherent  Descriptions of Associations:Intact  Orientation:Full (Time, Place and Person)  Thought Content:Logical  Diagnosis of Schizophrenia or Schizoaffective disorder in past: No  Duration of Psychotic Symptoms: Less than six months   Hallucinations:Hallucinations: None Description of Visual Hallucinations: Patient states that he occasionally sees things at the corner of his eyes  Ideas of Reference:None  Suicidal Thoughts:Suicidal Thoughts: Yes, Passive SI Passive Intent and/or Plan: Without Plan; Without Means to Carry Out; Without Access to Means; Without Intent  Homicidal Thoughts:Homicidal Thoughts:  No   Sensorium  Memory:Immediate Fair; Recent Fair; Remote [place]  Insight:Fair   Executive Functions  Concentration:Good  Attention Span:Good  Recall:Good  Fund of Knowledge:Good  Language:Good   Psychomotor Activity  Psychomotor Activity:Psychomotor Activity: Normal   Assets  Assets:Communication Skills; Desire for Improvement; Financial Resources/Insurance; Physical Health; Social Support; Resilience   Sleep  Sleep:Sleep: Fair   Nutritional Assessment (For OBS and FBC admissions only) Has the patient had a weight loss or gain of 10 pounds or more in the last 3 months?: No Has the patient had a decrease in food intake/or appetite?: Yes Does the patient have dental problems?: Yes Does the patient have eating habits or behaviors that may be indicators of an eating disorder including binging or inducing vomiting?: No Has the patient recently lost weight without trying?: 0 Has the patient been eating poorly because of a decreased appetite?: 1 Malnutrition Screening Tool Score: 1    Physical Exam  Physical Exam Vitals and nursing note reviewed.  Constitutional:      General: He is not in acute distress.    Appearance: Normal appearance. He is well-developed.  HENT:     Head: Normocephalic and atraumatic.  Eyes:     General:  Right eye: No discharge.        Left eye: No discharge.     Conjunctiva/sclera: Conjunctivae normal.  Cardiovascular:     Rate and Rhythm: Normal rate.  Pulmonary:     Effort: Pulmonary effort is normal. No respiratory distress.  Musculoskeletal:        General: Normal range of motion.     Cervical back: Normal range of motion.  Skin:    Coloration: Skin is not jaundiced or pale.  Neurological:     Mental Status: He is alert and oriented to person, place, and time.  Psychiatric:        Attention and Perception: Attention and perception normal.        Mood and Affect: Mood is anxious and depressed.         Speech: Speech normal.        Behavior: Behavior is cooperative.        Thought Content: Thought content includes suicidal ideation. Thought content does not include suicidal plan.        Cognition and Memory: Cognition normal.        Judgment: Judgment normal.   Review of Systems  Constitutional: Negative.   HENT: Negative.    Eyes: Negative.   Respiratory: Negative.    Cardiovascular: Negative.   Musculoskeletal:  Positive for back pain.  Skin: Negative.   Neurological: Negative.   Psychiatric/Behavioral:  Positive for depression and suicidal ideas. The patient is nervous/anxious.   Blood pressure (!) 148/96, pulse (!) 58, temperature 98.5 F (36.9 C), temperature source Oral, resp. rate 18, SpO2 100 %. There is no height or weight on file to calculate BMI.  Treatment Plan Summary: Disposition: Ongoing.  This patient continues to meet criteria for treatment in the [facility].  We will continue to have daily contact with patient to assess and evaluate symptoms and progress in treatment and Medication management  Back pain: Lidocaine 5% patch x2 daily was ordered for  lower back daily  Robaxin 500 mg twice daily (per [facility] ED).    Potassium on admission 3.1.  Potassium 20 mg x 2 doses ordered.  CMP will be repeated in the a.m.  Home medications restarted:    hydrOXYzine (ATARAX) tablet 25 mg TID PRN   traZODone (DESYREL) tablet 50 mg QHS PRN   lithium carbonate (ESKALITH) CR tablet 450 mg BID   amLODipine (NORVASC) tablet 10 mg QD   famotidine (PEPCID) tablet 20 mg QD   mirtazapine (REMERON) tablet 15 mg QHS   QUEtiapine (SEROQUEL) tablet 100 mg QHS   venlafaxine XR (EFFEXOR-XR) 24 hr capsule 75 mg QD    EKG 09/10/2021 Vent. rate 66 BPM PR interval 156 ms QRS duration 82 ms QT/QTcB 412/431 ms P-R-T axes 47 6 47   Revonda Humphrey, NP 09/11/2021 11:46 AM

## 2021-09-11 NOTE — ED Notes (Signed)
Pt sleeping'@this'$  time.breathing even and unlabored. Will continue to monitor for safety

## 2021-09-11 NOTE — ED Provider Notes (Signed)
Facility Based Crisis Admission H&P  Date: 09/11/21 Patient Name: Casey Silva MRN: [id number] Chief Complaint:  Chief Complaint  Patient presents with   Suicidal   Depression      Diagnoses:  Final diagnoses:  Severe episode of recurrent major depressive disorder, with psychotic features ([place])  Generalized anxiety disorder    HPI:   Casey Silva is a 66 year old male with a past psychiatric history significant for major depressive disorder with psychotic features who presents to [facility] after being medically cleared at [facility] ED. patient was originally transferred to [facility] due to a back pain.  During the patient's assessment, his overall symptoms appeared to be most consistent with lumbar muscle strain.  He was given intramuscular Toradol and Robaxin while in the ED.  After patient was assessed at [facility] ED, patient was recommended Tylenol/Motrin for pain and was given a prescription for Robaxin.  Patient presents to [facility] due to overwhelming suicidal thoughts and worsening feelings of sadness.  Endorses the following symptoms related to his depression: feelings of sadness, lack of motivation, decreased concentration, decreased energy, difficulty getting out of bed, feelings of guilt/worthlessness, and hopelessness.  Patient denies irritability.  Patient does endorse anxiety and rates his anxiety at 6 out of 10.  Patient states that his anxiety is manageable at this time.  Patient states that his depressive symptoms were triggered by his declining physical health.  Patient endorses that he is losing his eyesight and is missing a couple of teeth.  Patient also states that he has memory issues and has difficulty remembering multiple things.  Patient endorses being on psychiatric medications but does not remember what medications he was placed on.  Patient reports having a recent hospitalization at [facility] and states that his  medication regimen was increased from 4 medications to 8.  While at [facility], patient states that he underwent ECT for the management of his depressive symptoms.  Patient denied effectiveness in the procedure.  Patient states that he has been hospitalized several times with his last hospitalization occurring at [facility].  Patient states that his last hospitalization was due to suicidal thoughts with no plans.  Patient endorses a past history of self-harm and states that he cut his stomach 6 months ago.  Patient is alert and oriented x4, calm, cooperative, and engaged in conversation during the encounter.  Patient endorses having thoughts of wanting to harm himself.  He states that he often wishes that he were gone.  Patient denies homicidal ideations.  He further denies auditory or visual hallucinations but states that he occasionally sees things at the corner of his eye.  Patient does not appear to be responding to internal/external stimuli at this time.  Patient endorses poor sleep and states that he receives on average 1 to 2 hours of sleep each night.  Patient endorses decreased appetite and eats on average 1 meal per day.  Patient denies alcohol consumption, tobacco use, and illicit drug use.  PHQ 2-9:  Flowsheet Row ED from 06/07/2021 in [place] DEPT Video Visit from 03/30/2021 in [place] from 12/28/2020 in [facility]  Thoughts that you would be better off dead, or of hurting yourself in some way Nearly every day More than half the days Nearly every day  PHQ-9 Total Score '19 20 24       '$ Flowsheet Row ED from 09/10/2021 in [facility] Most recent reading at 09/11/2021 12:32 AM ED from 09/10/2021 in [place] Most recent reading at 09/10/2021  5:49 PM ED from 09/10/2021 in [facility] Most recent reading at 09/10/2021  4:28 PM  C-SSRS RISK CATEGORY Low Risk No Risk High Risk        Total Time spent with patient: 20 minutes  Musculoskeletal  Strength & Muscle Tone: within normal limits Gait & Station: normal Patient leans: N/A  Psychiatric Specialty Exam  Presentation General Appearance: Appropriate for Environment; Casual; Fairly Groomed  Eye Contact:Good  Speech:Clear and Coherent; Normal Rate  Speech Volume:Normal  Handedness:Right   Mood and Affect  Mood:Anxious; Depressed; Hopeless; Worthless  Affect:Depressed; Congruent   Thought Process  Thought Processes:Coherent; Linear  Descriptions of Associations:Intact  Orientation:Full (Time, Place and Person)  Thought Content:Logical; WDL  Diagnosis of Schizophrenia or Schizoaffective disorder in past: No  Duration of Psychotic Symptoms: Less than six months  Hallucinations:Hallucinations: Visual Description of Visual Hallucinations: Patient states that he occasionally sees things at the corner of his eyes  Ideas of Reference:None  Suicidal Thoughts:Suicidal Thoughts: Yes, Passive SI Passive Intent and/or Plan: Without Intent; Without Plan  Homicidal Thoughts:Homicidal Thoughts: No   Sensorium  Memory:Immediate Fair; Recent Fair; Remote Poor  Judgment:Fair  Insight:Fair   Executive Functions  Concentration:Good  Attention Span:Good  [place]; Good  Language:Good   Psychomotor Activity  Psychomotor Activity:Psychomotor Activity: Normal   Assets  Assets:Communication Skills; Desire for Improvement; Housing; Social Support   Sleep  Sleep:Sleep: Poor   Nutritional Assessment (For OBS and [facility] admissions only) Has the patient had a weight loss or gain of 10 pounds or more in the last 3 months?: No Has the patient had a decrease in food intake/or appetite?: Yes Does the patient have dental problems?: Yes Does the patient have eating  habits or behaviors that may be indicators of an eating disorder including binging or inducing vomiting?: No Has the patient recently lost weight without trying?: 0 Has the patient been eating poorly because of a decreased appetite?: 1 Malnutrition Screening Tool Score: 1    Physical Exam ROS  Blood pressure (!) 148/96, pulse (!) 58, temperature 98.5 F (36.9 C), temperature source Oral, resp. rate 18, SpO2 100 %. There is no height or weight on file to calculate BMI.  Past Psychiatric History:  Major depressive disorder with psychotic features  Is the patient at risk to self? Yes  Has the patient been a risk to self in the past 6 months? Yes .    Has the patient been a risk to self within the distant past? Yes   Is the patient a risk to others? No   Has the patient been a risk to others in the past 6 months? No   Has the patient been a risk to others within the distant past? No   Past Medical History:  Past Medical History:  Diagnosis Date   Anxiety    Asthma    Depression    No past surgical history on file.  Family History:  Family History  Problem Relation Age of Onset   Bipolar disorder Mother     Social History:  Social History   Socioeconomic History   Marital status: Single    Spouse name: Not on file   Number of children: 1   Years of education: Not on file   Highest education level: GED or equivalent  Occupational History  Not on file  Tobacco Use   Smoking status: Never   Smokeless tobacco: Never  Vaping Use   Vaping Use: Unknown  Substance and Sexual Activity   Alcohol use: No   Drug use: No   Sexual activity: Not Currently  Other Topics Concern   Not on file  Social History Narrative   Marital status:  Single   Children: Daughter   Employment:  Unemployment. previous work for [employer].   Alcohol:  None   Drugs:  none   Exercise:  Regularly very   Religion:  Jewish   Social Determinants of Radio broadcast assistant Strain: Not on  file  Food Insecurity: Not on file  Transportation Needs: Not on file  Physical Activity: Not on file  Stress: Not on file  Social Connections: Not on file  Intimate Partner Violence: Not on file    SDOH:  SDOH Screenings   Alcohol Screen: Low Risk    Last Alcohol Screening Score (AUDIT): 0  Depression (PHQ2-9): Medium Risk   PHQ-2 Score: 19  Financial Resource Strain: Not on file  Food Insecurity: Not on file  Housing: Not on file  Physical Activity: Not on file  Social Connections: Not on file  Stress: Not on file  Tobacco Use: Low Risk    Smoking Tobacco Use: Never   Smokeless Tobacco Use: Never   Passive Exposure: Not on file  Transportation Needs: Not on file    Last Labs:  Admission on 09/10/2021, Discharged on 09/10/2021  Component Date Value Ref Range Status   Color, Urine 09/10/2021 STRAW (A)  YELLOW Final   APPearance 09/10/2021 CLEAR  CLEAR Final   Specific Gravity, Urine 09/10/2021 1.008  1.005 - 1.030 Final   pH 09/10/2021 5.0  5.0 - 8.0 Final   Glucose, UA 09/10/2021 NEGATIVE  NEGATIVE mg/dL Final   Hgb urine dipstick 09/10/2021 MODERATE (A)  NEGATIVE Final   Bilirubin Urine 09/10/2021 NEGATIVE  NEGATIVE Final   Ketones, ur 09/10/2021 NEGATIVE  NEGATIVE mg/dL Final   Protein, ur 09/10/2021 NEGATIVE  NEGATIVE mg/dL Final   Nitrite 09/10/2021 NEGATIVE  NEGATIVE Final   Leukocytes,Ua 09/10/2021 NEGATIVE  NEGATIVE Final   RBC / HPF 09/10/2021 0-5  0 - 5 RBC/hpf Final   WBC, UA 09/10/2021 0-5  0 - 5 WBC/hpf Final   Bacteria, UA 09/10/2021 RARE (A)  NONE SEEN Final   Performed at [facility], [place] [address], [place], [place] [address]  Admission on 09/10/2021, Discharged on 09/10/2021  Component Date Value Ref Range Status   SARS Coronavirus 2 by RT PCR 09/10/2021 NEGATIVE  NEGATIVE Final   Comment: (NOTE) SARS-CoV-2 target nucleic acids are NOT DETECTED.  The SARS-CoV-2 RNA is generally detectable in upper respiratory specimens during the acute  phase of infection. The lowest concentration of SARS-CoV-2 viral copies this assay can detect is 138 copies/mL. A negative result does not preclude SARS-Cov-2 infection and should not be used as the sole basis for treatment or other patient management decisions. A negative result may occur with  improper specimen collection/handling, submission of specimen other than nasopharyngeal swab, presence of viral mutation(s) within the areas targeted by this assay, and inadequate number of viral copies(<138 copies/mL). A negative result must be combined with clinical observations, patient history, and epidemiological information. The expected result is Negative.  Fact Sheet for Patients:  EntrepreneurPulse.com.au  Fact Sheet for Healthcare Providers:  IncredibleEmployment.be  This test is no  t yet approved or cleared by the [place] and  has been authorized for detection and/or diagnosis of SARS-CoV-2 by FDA under an Emergency Use Authorization (EUA). This EUA will remain  in effect (meaning this test can be used) for the duration of the COVID-19 declaration under Section 564(b)(1) of the Act, 21 U.S.C.section 360bbb-3(b)(1), unless the authorization is terminated  or revoked sooner.       Influenza A by PCR 09/10/2021 NEGATIVE  NEGATIVE Final   Influenza B by PCR 09/10/2021 NEGATIVE  NEGATIVE Final   Comment: (NOTE) The Xpert Xpress SARS-CoV-2/FLU/RSV plus assay is intended as an aid in the diagnosis of influenza from Nasopharyngeal swab specimens and should not be used as a sole basis for treatment. Nasal washings and aspirates are unacceptable for Xpert Xpress SARS-CoV-2/FLU/RSV testing.  Fact Sheet for Patients: EntrepreneurPulse.com.au  Fact Sheet for Healthcare Providers: IncredibleEmployment.be  This test is not yet approved or cleared by the [place] FDA and has  been authorized for detection and/or diagnosis of SARS-CoV-2 by FDA under an Emergency Use Authorization (EUA). This EUA will remain in effect (meaning this test can be used) for the duration of the COVID-19 declaration under Section 564(b)(1) of the Act, 21 U.S.C. section 360bbb-3(b)(1), unless the authorization is terminated or revoked.  Performed at [facility], [place] [address] [place], [place] [address]    WBC 09/10/2021 6.1  4.0 - 10.5 K/uL Final   RBC 09/10/2021 4.81  4.22 - 5.81 MIL/uL Final   Hemoglobin 09/10/2021 14.5  13.0 - 17.0 g/dL Final   HCT 09/10/2021 42.1  39.0 - 52.0 % Final   MCV 09/10/2021 87.5  80.0 - 100.0 fL Final   MCH 09/10/2021 30.1  26.0 - 34.0 pg Final   MCHC 09/10/2021 34.4  30.0 - 36.0 g/dL Final   RDW 09/10/2021 14.7  11.5 - 15.5 % Final   Platelets 09/10/2021 275  150 - 400 K/uL Final   nRBC 09/10/2021 0.0  0.0 - 0.2 % Final   Neutrophils Relative % 09/10/2021 54  % Final   Neutro Abs 09/10/2021 3.3  1.7 - 7.7 K/uL Final   Lymphocytes Relative 09/10/2021 32  % Final   Lymphs Abs 09/10/2021 1.9  0.7 - 4.0 K/uL Final   Monocytes Relative 09/10/2021 8  % Final   Monocytes Absolute 09/10/2021 0.5  0.1 - 1.0 K/uL Final   Eosinophils Relative 09/10/2021 5  % Final   Eosinophils Absolute 09/10/2021 0.3  0.0 - 0.5 K/uL Final   Basophils Relative 09/10/2021 1  % Final   Basophils Absolute 09/10/2021 0.1  0.0 - 0.1 K/uL Final   Immature Granulocytes 09/10/2021 0  % Final   Abs Immature Granulocytes 09/10/2021 0.02  0.00 - 0.07 K/uL Final   Performed at [facility], [place] [address] [place], [place] [address]   Sodium 09/10/2021 137  135 - 145 mmol/L Final   Potassium 09/10/2021 3.1 (L)  3.5 - 5.1 mmol/L Final   Chloride 09/10/2021 103  98 - 111 mmol/L Final   CO2 09/10/2021 23  22 - 32 mmol/L Final   Glucose, Bld 09/10/2021 119 (H)  70 - 99 mg/dL Final   Glucose reference range applies only to samples taken after fasting for at least 8 hours.    BUN 09/10/2021 15  8 - 23 mg/dL Final   Creatinine, Ser 09/10/2021 1.04  0.61 - 1.24 mg/dL Final   Calcium 09/10/2021 8.9  8.9 - 10.3 mg/dL Final  Total Protein 09/10/2021 6.9  6.5 - 8.1 g/dL Final   Albumin 09/10/2021 4.0  3.5 - 5.0 g/dL Final   AST 09/10/2021 16  15 - 41 U/L Final   ALT 09/10/2021 14  0 - 44 U/L Final   Alkaline Phosphatase 09/10/2021 57  38 - 126 U/L Final   Total Bilirubin 09/10/2021 0.4  0.3 - 1.2 mg/dL Final   GFR, Estimated 09/10/2021 >60  >60 mL/min Final   Comment: (NOTE) Calculated using the CKD-EPI Creatinine Equation (2021)    Anion gap 09/10/2021 11  5 - 15 Final   Performed at [facility], [place] [address] [place], [place] [address]   TSH 09/10/2021 1.983  0.350 - 4.500 uIU/mL Final   Comment: Performed by a 3rd Generation assay with a functional sensitivity of <=0.01 uIU/mL. Performed at [facility], [place] [address], [place], [place] [address]    Lithium Lvl 09/10/2021 <0.06 (L)  0.60 - 1.20 mmol/L Final   Performed at [place] [address] [place], [place] [address]  Admission on 06/23/2021, Discharged on 08/03/2021  Component Date Value Ref Range Status   Glucose-Capillary 06/27/2021 85  70 - 99 mg/dL Final   Glucose reference range applies only to samples taken after fasting for at least 8 hours.   Comment 1 06/27/2021 Notify RN   Final   Glucose-Capillary 06/29/2021 88  70 - 99 mg/dL Final   Glucose reference range applies only to samples taken after fasting for at least 8 hours.   Glucose-Capillary 06/29/2021 96  70 - 99 mg/dL Final   Glucose reference range applies only to samples taken after fasting for at least 8 hours.   Adenovirus 06/30/2021 NOT DETECTED  NOT DETECTED Final   Coronavirus 229E 06/30/2021 NOT DETECTED  NOT DETECTED Final   Comment: (NOTE) The Coronavirus on the Respiratory Panel, DOES NOT test for the novel  Coronavirus (2019 nCoV)    Coronavirus HKU1 06/30/2021 NOT DETECTED  NOT DETECTED Final    Coronavirus NL63 06/30/2021 NOT DETECTED  NOT DETECTED Final   Coronavirus OC43 06/30/2021 NOT DETECTED  NOT DETECTED Final   Metapneumovirus 06/30/2021 NOT DETECTED  NOT DETECTED Final   Rhinovirus / Enterovirus 06/30/2021 NOT DETECTED  NOT DETECTED Final   Influenza A 06/30/2021 NOT DETECTED  NOT DETECTED Final   Influenza B 06/30/2021 NOT DETECTED  NOT DETECTED Final   Parainfluenza Virus 1 06/30/2021 NOT DETECTED  NOT DETECTED Final   Parainfluenza Virus 2 06/30/2021 NOT DETECTED  NOT DETECTED Final   Parainfluenza Virus 3 06/30/2021 NOT DETECTED  NOT DETECTED Final   Parainfluenza Virus 4 06/30/2021 NOT DETECTED  NOT DETECTED Final   Respiratory Syncytial Virus 06/30/2021 NOT DETECTED  NOT DETECTED Final   Bordetella pertussis 06/30/2021 NOT DETECTED  NOT DETECTED Final   Bordetella Parapertussis 06/30/2021 NOT DETECTED  NOT DETECTED Final   Chlamydophila pneumoniae 06/30/2021 NOT DETECTED  NOT DETECTED Final   Mycoplasma pneumoniae 06/30/2021 NOT DETECTED  NOT DETECTED Final   Performed at [facility], [place] [address], [place], [place] [address]   HIV Screen 4th Generation wRfx 06/30/2021 Non Reactive  Non Reactive Final   Performed at [facility], [place] [address], [place], [place] [address]   SARS Coronavirus 2 by RT PCR 06/30/2021 NEGATIVE  NEGATIVE Final   Comment: (NOTE) SARS-CoV-2 target nucleic acids are NOT DETECTED.  The SARS-CoV-2 RNA is generally detectable in upper respiratory specimens during the acute phase of  infection. The lowest concentration of SARS-CoV-2 viral copies this assay can detect is 138 copies/mL. A negative result does not preclude SARS-Cov-2 infection and should not be used as the sole basis for treatment or other patient management decisions. A negative result may occur with  improper specimen collection/handling, submission of specimen other than nasopharyngeal swab, presence of viral mutation(s) within the areas targeted by this  assay, and inadequate number of viral copies(<138 copies/mL). A negative result must be combined with clinical observations, patient history, and epidemiological information. The expected result is Negative.  Fact Sheet for Patients:  EntrepreneurPulse.com.au  Fact Sheet for Healthcare Providers:  IncredibleEmployment.be  This test is no                          t yet approved or cleared by the [place] FDA and  has been authorized for detection and/or diagnosis of SARS-CoV-2 by FDA under an Emergency Use Authorization (EUA). This EUA will remain  in effect (meaning this test can be used) for the duration of the COVID-19 declaration under Section 564(b)(1) of the Act, 21 U.S.C.section 360bbb-3(b)(1), unless the authorization is terminated  or revoked sooner.       Influenza A by PCR 06/30/2021 NEGATIVE  NEGATIVE Final   Influenza B by PCR 06/30/2021 NEGATIVE  NEGATIVE Final   Comment: (NOTE) The Xpert Xpress SARS-CoV-2/FLU/RSV plus assay is intended as an aid in the diagnosis of influenza from Nasopharyngeal swab specimens and should not be used as a sole basis for treatment. Nasal washings and aspirates are unacceptable for Xpert Xpress SARS-CoV-2/FLU/RSV testing.  Fact Sheet for Patients: EntrepreneurPulse.com.au  Fact Sheet for Healthcare Providers: IncredibleEmployment.be  This test is not yet approved or cleared by the [place] FDA and has been authorized for detection and/or diagnosis of SARS-CoV-2 by FDA under an Emergency Use Authorization (EUA). This EUA will remain in effect (meaning this test can be used) for the duration of the COVID-19 declaration under Section 564(b)(1) of the Act, 21 U.S.C. section 360bbb-3(b)(1), unless the authorization is terminated or revoked.  Performed at [facility], [place] [place], [place] [address]    Campylobacter species  06/30/2021 NOT DETECTED  NOT DETECTED Final   Plesimonas shigelloides 06/30/2021 NOT DETECTED  NOT DETECTED Final   Salmonella species 06/30/2021 NOT DETECTED  NOT DETECTED Final   Yersinia enterocolitica 06/30/2021 NOT DETECTED  NOT DETECTED Final   Vibrio species 06/30/2021 NOT DETECTED  NOT DETECTED Final   Vibrio cholerae 06/30/2021 NOT DETECTED  NOT DETECTED Final   Enteroaggregative E coli (EAEC) 06/30/2021 NOT DETECTED  NOT DETECTED Final   Enteropathogenic E coli (EPEC) 06/30/2021 NOT DETECTED  NOT DETECTED Final   Enterotoxigenic E coli (ETEC) 06/30/2021 NOT DETECTED  NOT DETECTED Final   Shiga like toxin producing E coli * 06/30/2021 NOT DETECTED  NOT DETECTED Final   Shigella/Enteroinvasive E coli (EI* 06/30/2021 NOT DETECTED  NOT DETECTED Final   Cryptosporidium 06/30/2021 NOT DETECTED  NOT DETECTED Final   Cyclospora cayetanensis 06/30/2021 NOT DETECTED  NOT DETECTED Final   Entamoeba histolytica 06/30/2021 NOT DETECTED  NOT DETECTED Final   Giardia lamblia 06/30/2021 NOT DETECTED  NOT DETECTED Final   Adenovirus F40/41 06/30/2021 NOT DETECTED  NOT DETECTED Final   Astrovirus 06/30/2021 NOT DETECTED  NOT DETECTED Final   Norovirus GI/GII 06/30/2021 NOT DETECTED  NOT DETECTED Final   Rotavirus A 06/30/2021 NOT DETECTED  NOT DETECTED Final   Sapovirus (I, II, IV,  and V) 06/30/2021 NOT DETECTED  NOT DETECTED Final   Performed at [facility], [place] [place], [place] [address]   Glucose-Capillary 07/01/2021 82  70 - 99 mg/dL Final   Glucose reference range applies only to samples taken after fasting for at least 8 hours.   Glucose-Capillary 07/04/2021 105 (H)  70 - 99 mg/dL Final   Glucose reference range applies only to samples taken after fasting for at least 8 hours.   Comment 1 07/04/2021 Notify RN   Final   Glucose-Capillary 07/06/2021 90  70 - 99 mg/dL Final   Glucose reference range applies only to samples taken after fasting for at least 8 hours.    Glucose-Capillary 07/08/2021 84  70 - 99 mg/dL Final   Glucose reference range applies only to samples taken after fasting for at least 8 hours.   Glucose-Capillary 07/11/2021 81  70 - 99 mg/dL Final   Glucose reference range applies only to samples taken after fasting for at least 8 hours.   Glucose-Capillary 07/11/2021 91  70 - 99 mg/dL Final   Glucose reference range applies only to samples taken after fasting for at least 8 hours.   Glucose-Capillary 07/13/2021 87  70 - 99 mg/dL Final   Glucose reference range applies only to samples taken after fasting for at least 8 hours.   Glucose-Capillary 07/15/2021 78  70 - 99 mg/dL Final   Glucose reference range applies only to samples taken after fasting for at least 8 hours.   Glucose-Capillary 07/18/2021 92  70 - 99 mg/dL Final   Glucose reference range applies only to samples taken after fasting for at least 8 hours.   TSH 07/26/2021 1.361  0.350 - 4.500 uIU/mL Final   Comment: Performed by a 3rd Generation assay with a functional sensitivity of <=0.01 uIU/mL. Performed at [facility], [place], [place] [address]    Sodium 07/26/2021 137  135 - 145 mmol/L Final   Potassium 07/26/2021 4.1  3.5 - 5.1 mmol/L Final   Chloride 07/26/2021 100  98 - 111 mmol/L Final   CO2 07/26/2021 27  22 - 32 mmol/L Final   Glucose, Bld 07/26/2021 131 (H)  70 - 99 mg/dL Final   Glucose reference range applies only to samples taken after fasting for at least 8 hours.   BUN 07/26/2021 21  8 - 23 mg/dL Final   Creatinine, Ser 07/26/2021 1.06  0.61 - 1.24 mg/dL Final   Calcium 07/26/2021 9.0  8.9 - 10.3 mg/dL Final   GFR, Estimated 07/26/2021 >60  >60 mL/min Final   Comment: (NOTE) Calculated using the CKD-EPI Creatinine Equation (2021)    Anion gap 07/26/2021 10  5 - 15 Final   Performed at [facility], [place], [place] [address]   Lithium Lvl 07/30/2021 0.48 (L)  0.60 - 1.20 mmol/L Final   Performed at  [facility], [place] [place],  [address]  Admission on 06/07/2021, Discharged on 06/23/2021  Component Date Value Ref Range Status   Sodium 06/09/2021 130 (L)  135 - 145 mmol/L Final   Potassium 06/09/2021 3.7  3.5 - 5.1 mmol/L Final   Chloride 06/09/2021 95 (L)  98 - 111 mmol/L Final   CO2 06/09/2021 23  22 - 32 mmol/L Final   Glucose, Bld 06/09/2021 103 (H)  70 - 99 mg/dL Final   Glucose reference range applies only to samples taken after fasting for at least 8 hours.  BUN 06/09/2021 10  8 - 23 mg/dL Final   Creatinine, Ser 06/09/2021 0.89  0.61 - 1.24 mg/dL Final   Calcium 06/09/2021 9.8  8.9 - 10.3 mg/dL Final   GFR, Estimated 06/09/2021 >60  >60 mL/min Final   Comment: (NOTE) Calculated using the CKD-EPI Creatinine Equation (2021)    Anion gap 06/09/2021 12  5 - 15 Final   Performed at [facility], [place] [address], [place], [place] [address]   Vitamin B-12 06/10/2021 325  180 - 914 pg/mL Final   Comment: (NOTE) This assay is not validated for testing neonatal or myeloproliferative syndrome specimens for Vitamin B12 levels. Performed at [facility], [place] [address] [place], [place] [address]    Vit D, 25-Hydroxy 06/10/2021 14.94 (L)  30 - 100 ng/mL Final   Comment: (NOTE) Vitamin D deficiency has been defined by the [place] practice guideline as a level of serum 25-OH  vitamin D less than 20 ng/mL (1,2). The Endocrine Society went on to  further define vitamin D insufficiency as a level between 21 and 29  ng/mL (2).  1. IOM (Institute of Medicine). 2010. Dietary reference intakes for  calcium and D. [place]: [employer]. 2. Holick MF, Binkley [place], Bischoff-Ferrari HA, et al. Evaluation,  treatment, and prevention of vitamin D deficiency: an Endocrine  Society clinical practice guideline, JCEM. 2011 Jul; 96(7): 1911-30.  Performed at [facility], [place] [address], [place], [place] [address]    Folate 06/10/2021 9.8  >5.9 ng/mL Final   Performed at [facility], [place] [address] [place], [place] [address]   Vitamin B1 (Thiamine) 06/10/2021 121.6  66.5 - 200.0 nmol/L Final   Comment: (NOTE) This test was developed and its performance characteristics determined by [facility]. It has not been cleared or approved by the Food and Drug Administration. Performed At: [facility] [place], [place] [id number] Rush Farmer MD [id number]    Sodium 06/12/2021 133 (L)  135 - 145 mmol/L Final   Potassium 06/12/2021 3.6  3.5 - 5.1 mmol/L Final   Chloride 06/12/2021 99  98 - 111 mmol/L Final   CO2 06/12/2021 28  22 - 32 mmol/L Final   Glucose, Bld 06/12/2021 94  70 - 99 mg/dL Final   Glucose reference range applies only to samples taken after fasting for at least 8 hours.   BUN 06/12/2021 14  8 - 23 mg/dL Final   Creatinine, Ser 06/12/2021 1.03  0.61 - 1.24 mg/dL Final   Calcium 06/12/2021 8.9  8.9 - 10.3 mg/dL Final   GFR, Estimated 06/12/2021 >60  >60 mL/min Final   Comment: (NOTE) Calculated using the CKD-EPI Creatinine Equation (2021)    Anion gap 06/12/2021 6  5 - 15 Final   Performed at [facility], [place] [address], [place], [place] [address]   Sodium 06/16/2021 135  135 - 145 mmol/L Final   Potassium 06/16/2021 3.6  3.5 - 5.1 mmol/L Final   Chloride 06/16/2021 102  98 - 111 mmol/L Final   CO2 06/16/2021 24  22 - 32 mmol/L Final   Glucose, Bld 06/16/2021 84  70 - 99 mg/dL Final   Glucose reference range applies only to samples taken after fasting for at least 8 hours.   BUN 06/16/2021 16  8 - 23 mg/dL Final   Creatinine, Ser 06/16/2021 0.86  0.61 - 1.24 mg/dL Final  Calcium 06/16/2021 8.9  8.9 - 10.3 mg/dL Final   Total Protein 06/16/2021 6.5  6.5 - 8.1 g/dL Final   Albumin 06/16/2021 3.7  3.5 - 5.0 g/dL Final   AST 06/16/2021 18  15 - 41 U/L Final   ALT  06/16/2021 20  0 - 44 U/L Final   Alkaline Phosphatase 06/16/2021 53  38 - 126 U/L Final   Total Bilirubin 06/16/2021 0.3  0.3 - 1.2 mg/dL Final   GFR, Estimated 06/16/2021 >60  >60 mL/min Final   Comment: (NOTE) Calculated using the CKD-EPI Creatinine Equation (2021)    Anion gap 06/16/2021 9  5 - 15 Final   Performed at [facility], [place] [address] [place], [place] [address]   SARS Coronavirus 2 by RT PCR 06/21/2021 NEGATIVE  NEGATIVE Final   Comment: (NOTE) SARS-CoV-2 target nucleic acids are NOT DETECTED.  The SARS-CoV-2 RNA is generally detectable in upper respiratory specimens during the acute phase of infection. The lowest concentration of SARS-CoV-2 viral copies this assay can detect is 138 copies/mL. A negative result does not preclude SARS-Cov-2 infection and should not be used as the sole basis for treatment or other patient management decisions. A negative result may occur with  improper specimen collection/handling, submission of specimen other than nasopharyngeal swab, presence of viral mutation(s) within the areas targeted by this assay, and inadequate number of viral copies(<138 copies/mL). A negative result must be combined with clinical observations, patient history, and epidemiological information. The expected result is Negative.  Fact Sheet for Patients:  EntrepreneurPulse.com.au  Fact Sheet for Healthcare Providers:  IncredibleEmployment.be  This test is no                          t yet approved or cleared by the [place] FDA and  has been authorized for detection and/or diagnosis of SARS-CoV-2 by FDA under an Emergency Use Authorization (EUA). This EUA will remain  in effect (meaning this test can be used) for the duration of the COVID-19 declaration under Section 564(b)(1) of the Act, 21 U.S.C.section 360bbb-3(b)(1), unless the authorization is terminated  or revoked sooner.       Influenza  A by PCR 06/21/2021 NEGATIVE  NEGATIVE Final   Influenza B by PCR 06/21/2021 NEGATIVE  NEGATIVE Final   Comment: (NOTE) The Xpert Xpress SARS-CoV-2/FLU/RSV plus assay is intended as an aid in the diagnosis of influenza from Nasopharyngeal swab specimens and should not be used as a sole basis for treatment. Nasal washings and aspirates are unacceptable for Xpert Xpress SARS-CoV-2/FLU/RSV testing.  Fact Sheet for Patients: EntrepreneurPulse.com.au  Fact Sheet for Healthcare Providers: IncredibleEmployment.be  This test is not yet approved or cleared by the [place] FDA and has been authorized for detection and/or diagnosis of SARS-CoV-2 by FDA under an Emergency Use Authorization (EUA). This EUA will remain in effect (meaning this test can be used) for the duration of the COVID-19 declaration under Section 564(b)(1) of the Act, 21 U.S.C. section 360bbb-3(b)(1), unless the authorization is terminated or revoked.  Performed at [facility], [place] [address] [place], [place] [address]    Sodium 06/23/2021 137  135 - 145 mmol/L Final   Potassium 06/23/2021 4.0  3.5 - 5.1 mmol/L Final   Chloride 06/23/2021 103  98 - 111 mmol/L Final   CO2 06/23/2021 28  22 - 32 mmol/L Final   Glucose, Bld 06/23/2021 85  70 - 99 mg/dL Final   Glucose  reference range applies only to samples taken after fasting for at least 8 hours.   BUN 06/23/2021 14  8 - 23 mg/dL Final   Creatinine, Ser 06/23/2021 0.85  0.61 - 1.24 mg/dL Final   Calcium 06/23/2021 9.1  8.9 - 10.3 mg/dL Final   GFR, Estimated 06/23/2021 >60  >60 mL/min Final   Comment: (NOTE) Calculated using the CKD-EPI Creatinine Equation (2021)    Anion gap 06/23/2021 6  5 - 15 Final   Performed at [facility], [place] [address], [place], [place] [address]   SARS Coronavirus 2 by RT PCR 06/23/2021 NEGATIVE  NEGATIVE Final   Comment: (NOTE) SARS-CoV-2 target nucleic acids  are NOT DETECTED.  The SARS-CoV-2 RNA is generally detectable in upper respiratory specimens during the acute phase of infection. The lowest concentration of SARS-CoV-2 viral copies this assay can detect is 138 copies/mL. A negative result does not preclude SARS-Cov-2 infection and should not be used as the sole basis for treatment or other patient management decisions. A negative result may occur with  improper specimen collection/handling, submission of specimen other than nasopharyngeal swab, presence of viral mutation(s) within the areas targeted by this assay, and inadequate number of viral copies(<138 copies/mL). A negative result must be combined with clinical observations, patient history, and epidemiological information. The expected result is Negative.  Fact Sheet for Patients:  EntrepreneurPulse.com.au  Fact Sheet for Healthcare Providers:  IncredibleEmployment.be  This test is no                          t yet approved or cleared by the [place] FDA and  has been authorized for detection and/or diagnosis of SARS-CoV-2 by FDA under an Emergency Use Authorization (EUA). This EUA will remain  in effect (meaning this test can be used) for the duration of the COVID-19 declaration under Section 564(b)(1) of the Act, 21 U.S.C.section 360bbb-3(b)(1), unless the authorization is terminated  or revoked sooner.       Influenza A by PCR 06/23/2021 NEGATIVE  NEGATIVE Final   Influenza B by PCR 06/23/2021 NEGATIVE  NEGATIVE Final   Comment: (NOTE) The Xpert Xpress SARS-CoV-2/FLU/RSV plus assay is intended as an aid in the diagnosis of influenza from Nasopharyngeal swab specimens and should not be used as a sole basis for treatment. Nasal washings and aspirates are unacceptable for Xpert Xpress SARS-CoV-2/FLU/RSV testing.  Fact Sheet for Patients: EntrepreneurPulse.com.au  Fact Sheet for Healthcare  Providers: IncredibleEmployment.be  This test is not yet approved or cleared by the [place] FDA and has been authorized for detection and/or diagnosis of SARS-CoV-2 by FDA under an Emergency Use Authorization (EUA). This EUA will remain in effect (meaning this test can be used) for the duration of the COVID-19 declaration under Section 564(b)(1) of the Act, 21 U.S.C. section 360bbb-3(b)(1), unless the authorization is terminated or revoked.  Performed at [facility], [place] [address] [place], [place] [address]   Admission on 06/07/2021, Discharged on 06/07/2021  Component Date Value Ref Range Status   Sodium 06/07/2021 128 (L)  135 - 145 mmol/L Final   Potassium 06/07/2021 3.6  3.5 - 5.1 mmol/L Final   Chloride 06/07/2021 93 (L)  98 - 111 mmol/L Final   CO2 06/07/2021 21 (L)  22 - 32 mmol/L Final   Glucose, Bld 06/07/2021 110 (H)  70 - 99 mg/dL Final   Glucose reference range applies only to samples taken after fasting for at least  8 hours.   BUN 06/07/2021 10  8 - 23 mg/dL Final   Creatinine, Ser 06/07/2021 1.24  0.61 - 1.24 mg/dL Final   Calcium 06/07/2021 9.3  8.9 - 10.3 mg/dL Final   Total Protein 06/07/2021 7.5  6.5 - 8.1 g/dL Final   Albumin 06/07/2021 4.5  3.5 - 5.0 g/dL Final   AST 06/07/2021 22  15 - 41 U/L Final   ALT 06/07/2021 12  0 - 44 U/L Final   Alkaline Phosphatase 06/07/2021 63  38 - 126 U/L Final   Total Bilirubin 06/07/2021 0.7  0.3 - 1.2 mg/dL Final   GFR, Estimated 06/07/2021 >60  >60 mL/min Final   Comment: (NOTE) Calculated using the CKD-EPI Creatinine Equation (2021)    Anion gap 06/07/2021 14  5 - 15 Final   Performed at [facility], [place] [address], [place], [place] [address]   Alcohol, Ethyl (B) 06/07/2021 <10  <10 mg/dL Final   Comment: (NOTE) Lowest detectable limit for serum alcohol is 10 mg/dL.  For medical purposes only. Performed at [facility], [place] [address], [place], [place] [address]    Salicylate Lvl 54/56/2563 <7.0 (L)  7.0 - 30.0 mg/dL Final   Performed at [place] [address] [place], [place] [address]   Acetaminophen (Tylenol), Serum 06/07/2021 17  10 - 30 ug/mL Final   Comment: (NOTE) Therapeutic concentrations vary significantly. A range of 10-30 ug/mL  may be an effective concentration for many patients. However, some  are best treated at concentrations outside of this range. Acetaminophen concentrations >150 ug/mL at 4 hours after ingestion  and >50 ug/mL at 12 hours after ingestion are often associated with  toxic reactions.  Performed at [facility], [place] [address], [place], [place] [address]    WBC 06/07/2021 6.5  4.0 - 10.5 K/uL Final   RBC 06/07/2021 5.01  4.22 - 5.81 MIL/uL Final   Hemoglobin 06/07/2021 14.0  13.0 - 17.0 g/dL Final   HCT 06/07/2021 41.3  39.0 - 52.0 % Final   MCV 06/07/2021 82.4  80.0 - 100.0 fL Final   MCH 06/07/2021 27.9  26.0 - 34.0 pg Final   MCHC 06/07/2021 33.9  30.0 - 36.0 g/dL Final   RDW 06/07/2021 13.6  11.5 - 15.5 % Final   Platelets 06/07/2021 333  150 - 400 K/uL Final   nRBC 06/07/2021 0.0  0.0 - 0.2 % Final   Performed at [facility], [place] [address], [place], [place] [address]   Opiates 06/07/2021 NONE DETECTED  NONE DETECTED Final   Cocaine 06/07/2021 NONE DETECTED  NONE DETECTED Final   Benzodiazepines 06/07/2021 NONE DETECTED  NONE DETECTED Final   Amphetamines 06/07/2021 NONE DETECTED  NONE DETECTED Final   Tetrahydrocannabinol 06/07/2021 NONE DETECTED  NONE DETECTED Final   Barbiturates 06/07/2021 NONE DETECTED  NONE DETECTED Final   Comment: (NOTE) DRUG SCREEN FOR MEDICAL PURPOSES ONLY.  IF CONFIRMATION IS NEEDED FOR ANY PURPOSE, NOTIFY LAB WITHIN 5 DAYS.  LOWEST DETECTABLE LIMITS FOR URINE DRUG SCREEN Drug Class                     Cutoff (ng/mL) Amphetamine and metabolites    1000 Barbiturate and  metabolites    200 Benzodiazepine                 262 Tricyclics and metabolites     300 Opiates and metabolites  300 Cocaine and metabolites        300 THC                            50 Performed at [facility], [place] [address], [place], [place] [address]    SARS Coronavirus 2 by RT PCR 06/07/2021 NEGATIVE  NEGATIVE Final   Comment: (NOTE) SARS-CoV-2 target nucleic acids are NOT DETECTED.  The SARS-CoV-2 RNA is generally detectable in upper respiratory specimens during the acute phase of infection. The lowest concentration of SARS-CoV-2 viral copies this assay can detect is 138 copies/mL. A negative result does not preclude SARS-Cov-2 infection and should not be used as the sole basis for treatment or other patient management decisions. A negative result may occur with  improper specimen collection/handling, submission of specimen other than nasopharyngeal swab, presence of viral mutation(s) within the areas targeted by this assay, and inadequate number of viral copies(<138 copies/mL). A negative result must be combined with clinical observations, patient history, and epidemiological information. The expected result is Negative.  Fact Sheet for Patients:  EntrepreneurPulse.com.au  Fact Sheet for Healthcare Providers:  IncredibleEmployment.be  This test is no                          t yet approved or cleared by the [place] FDA and  has been authorized for detection and/or diagnosis of SARS-CoV-2 by FDA under an Emergency Use Authorization (EUA). This EUA will remain  in effect (meaning this test can be used) for the duration of the COVID-19 declaration under Section 564(b)(1) of the Act, 21 U.S.C.section 360bbb-3(b)(1), unless the authorization is terminated  or revoked sooner.       Influenza A by PCR 06/07/2021 NEGATIVE  NEGATIVE Final   Influenza B by PCR 06/07/2021 NEGATIVE  NEGATIVE Final   Comment:  (NOTE) The Xpert Xpress SARS-CoV-2/FLU/RSV plus assay is intended as an aid in the diagnosis of influenza from Nasopharyngeal swab specimens and should not be used as a sole basis for treatment. Nasal washings and aspirates are unacceptable for Xpert Xpress SARS-CoV-2/FLU/RSV testing.  Fact Sheet for Patients: EntrepreneurPulse.com.au  Fact Sheet for Healthcare Providers: IncredibleEmployment.be  This test is not yet approved or cleared by the [place] FDA and has been authorized for detection and/or diagnosis of SARS-CoV-2 by FDA under an Emergency Use Authorization (EUA). This EUA will remain in effect (meaning this test can be used) for the duration of the COVID-19 declaration under Section 564(b)(1) of the Act, 21 U.S.C. section 360bbb-3(b)(1), unless the authorization is terminated or revoked.  Performed at [facility], [place] [address] [place], [place] [address]    Magnesium 06/07/2021 1.7  1.7 - 2.4 mg/dL Final   Performed at [place] [address], [place], [place] [address]   Sodium 06/07/2021 131 (L)  135 - 145 mmol/L Final   Potassium 06/07/2021 3.8  3.5 - 5.1 mmol/L Final   Chloride 06/07/2021 97 (L)  98 - 111 mmol/L Final   CO2 06/07/2021 25  22 - 32 mmol/L Final   Glucose, Bld 06/07/2021 187 (H)  70 - 99 mg/dL Final   Glucose reference range applies only to samples taken after fasting for at least 8 hours.   BUN 06/07/2021 8  8 - 23 mg/dL Final   Creatinine, Ser 06/07/2021 1.11  0.61 - 1.24 mg/dL Final   Calcium 06/07/2021 9.3  8.9 - 10.3 mg/dL Final   GFR, Estimated 06/07/2021 >60  >60 mL/min Final   Comment: (NOTE) Calculated using the CKD-EPI Creatinine Equation (2021)    Anion gap 06/07/2021 9  5 - 15 Final   Performed at [facility], [place] [address], [place], [place] [address]   Magnesium 06/07/2021 1.7  1.7 - 2.4 mg/dL Final   Performed at [place] [address] [place], [place] [address]   Cholesterol 06/07/2021 197  0 - 200 mg/dL Final   Triglycerides 06/07/2021 149  <150 mg/dL Final   HDL 06/07/2021 44  >40 mg/dL Final   Total CHOL/HDL Ratio 06/07/2021 4.5  RATIO Final   VLDL 06/07/2021 30  0 - 40 mg/dL Final   LDL Cholesterol 06/07/2021 123 (H)  0 - 99 mg/dL Final   Comment:        Total Cholesterol/HDL:CHD Risk Coronary Heart Disease Risk Table                     Men   Women  1/2 Average Risk   3.4   3.3  Average Risk       5.0   4.4  2 X Average Risk   9.6   7.1  3 X Average Risk  23.4   11.0        Use the calculated Patient Ratio above and the CHD Risk Table to determine the patient's CHD Risk.        ATP III CLASSIFICATION (LDL):  <100     mg/dL   Optimal  100-129  mg/dL   Near or Above                    Optimal  130-159  mg/dL   Borderline  160-189  mg/dL   High  >190     mg/dL   Very High Performed at [place] [address] [place], [place] [address]    TSH 06/07/2021 1.328  0.350 - 4.500 uIU/mL Final   Comment: Performed by a 3rd Generation assay with a functional sensitivity of <=0.01 uIU/mL. Performed at [facility], [place] [address] [place], [place] [address]    Hgb A1c MFr Bld 06/07/2021 5.1  4.8 - 5.6 % Final   Comment: (NOTE) Pre diabetes:          5.7%-6.4%  Diabetes:              >6.4%  Glycemic control for   <7.0% adults with diabetes    Mean Plasma Glucose 06/07/2021 99.67  mg/dL Final   Performed at [facility], [place] [address] [place], [place] [address]    Allergies: Dust mite mixed allergen ext [mite (d. farinae)] and Bee pollen  PTA Medications: (Not in a hospital admission)   Long Term Goals: Improvement in symptoms so as ready for discharge  Short Term Goals: Ability to identify changes in lifestyle to reduce recurrence of condition will improve, Ability to verbalize feelings will improve, Ability to  disclose and discuss suicidal ideas, Ability to demonstrate self-control will improve, Ability to identify and develop effective coping behaviors will improve, Ability to maintain clinical measurements within normal limits will improve, Compliance with prescribed medications will improve, and Ability to identify triggers associated with substance abuse/mental health issues will improve  Medical Decision Making  Patient presents back to [facility] after being assessed at [facility] ED for his back pain.  During  the assessment, patient continues to endorse suicidal ideations but has no definitive plan.  Patient does not remember what medications he is currently taking for the management of his depression.  Due to patient's passive suicidal ideations worsening depression, patient is recommended for admission to the [facility] for medication management.  Admit orders to be placed prior to admitting patient onto the unit.    Recommendations  Based on my evaluation the patient does not appear to have an emergency medical condition.  Malachy Mood, PA 09/11/21  1:14 AM

## 2021-09-11 NOTE — ED Notes (Signed)
Patient A&Ox4. Denies intent to harm self/others when asked. Denies VH. Patient admits to [facility]. Patient states voices are stating "I'm going to shoot you".Patient denies any physical complaints when asked. No acute distress noted. Support and encouragement provided. Routine safety checks conducted according to facility protocol. Encouraged patient to notify staff if thoughts of harm toward self or others arise. Patient verbalize understanding and agreement. Will continue to monitor for safety.

## 2021-09-12 DIAGNOSIS — F333 Major depressive disorder, recurrent, severe with psychotic symptoms: Secondary | ICD-10-CM | POA: Diagnosis not present

## 2021-09-12 DIAGNOSIS — Z20822 Contact with and (suspected) exposure to covid-19: Secondary | ICD-10-CM | POA: Diagnosis not present

## 2021-09-12 DIAGNOSIS — F411 Generalized anxiety disorder: Secondary | ICD-10-CM | POA: Diagnosis not present

## 2021-09-12 LAB — COMPREHENSIVE METABOLIC PANEL
ALT: 12 U/L (ref 0–44)
AST: 16 U/L (ref 15–41)
Albumin: 3.6 g/dL (ref 3.5–5.0)
Alkaline Phosphatase: 55 U/L (ref 38–126)
Anion gap: 11 (ref 5–15)
BUN: 14 mg/dL (ref 8–23)
CO2: 24 mmol/L (ref 22–32)
Calcium: 9.3 mg/dL (ref 8.9–10.3)
Chloride: 104 mmol/L (ref 98–111)
Creatinine, Ser: 1.12 mg/dL (ref 0.61–1.24)
GFR, Estimated: >60 mL/min (ref >60)
Glucose, Bld: 104 mg/dL — ABNORMAL HIGH (ref 70–99)
Potassium: 4.5 mmol/L (ref 3.5–5.1)
Sodium: 139 mmol/L (ref 135–145)
Total Bilirubin: 0.6 mg/dL (ref 0.3–1.2)
Total Protein: 6.3 g/dL — ABNORMAL LOW (ref 6.5–8.1)

## 2021-09-12 MED ORDER — VENLAFAXINE HCL ER 75 MG PO CP24
75.0000 mg | ORAL_CAPSULE | Freq: Every day | ORAL | Status: DC
  Administered 2021-09-12 – 2021-09-13 (×2): 75 mg via ORAL
  Filled 2021-09-12 (×2): qty 1

## 2021-09-12 MED ORDER — LIDOCAINE 5 % EX PTCH
2.0000 | MEDICATED_PATCH | CUTANEOUS | Status: DC
  Administered 2021-09-12 – 2021-09-13 (×2): 2 via TRANSDERMAL
  Filled 2021-09-12 (×2): qty 2

## 2021-09-12 MED ORDER — QUETIAPINE FUMARATE 300 MG PO TABS
300.0000 mg | ORAL_TABLET | Freq: Every day | ORAL | Status: DC
  Administered 2021-09-12: 300 mg via ORAL
  Filled 2021-09-12: qty 1

## 2021-09-12 MED ORDER — MIRTAZAPINE 15 MG PO TABS
45.0000 mg | ORAL_TABLET | Freq: Every day | ORAL | Status: DC
  Administered 2021-09-12: 45 mg via ORAL
  Filled 2021-09-12: qty 3

## 2021-09-12 NOTE — ED Notes (Signed)
Pt is in the bed sleeping. Respirations are even and unlabored. No acute distress noted. Will continue to monitor for safety. 

## 2021-09-12 NOTE — ED Provider Notes (Signed)
Behavioral Health Progress Note  Date and Time: 09/12/2021 11:48 AM Name: Casey Silva MRN:  [id number]  Subjective:  Casey Silva 66 y.o., male patient presented to [facility] on 09/10/2021 with complaints of chronic SI and increased depression.  He was admitted to the [facility].  Shortly after being admitted patient was complaining of back pain and difficulty urinating.  He was sent to the [facility] emergency department for medical clearance.  He was prescribed Robaxin 500 mg twice daily and was transferred back to the [facility] on 6/3. [facility]DS[facility].  Per chart review patient has a psychiatric history of bipolar 1 disorder, MDD with psychotic features, psychosis, GAD, and suicidal ideation.  Patient was psychiatrically admitted to [facility] from 06/23/2021-08/03/2021.  He received 10 treatments of bilateral ECT during that admission.  He was admitted to [place] from 06/07/2021-06/23/2021, 11/23/2020-12/02/2020 and 09/24/2019-09/26/2019.   Patient seen and chart reviewed-patient has been medication compliant and appropriate with staff and peers on the unit. Patient interviewed in conjunction with LCSW this morning.Patient found laying in bed in no acute distress.  Patient appears dysphoric and displays PMR.  Patient states his reason for presentation to the behavioral health urgent care was "I have been declining physically and mentally".  Patient goes on to state that his depression has increased and that he has "lost motivation to live".  He denies any specific plan or intent at this time.  Patient provides different timelines for medication noncompliance.  Per chart review, he reported that he had been compliant with medications up until 4 to 5 days ago; however, today patient reports that he stopped taking all of his psychiatric medications approximately 3 weeks ago apart from Remeron and Seroquel as these assist with his sleep.  Patient indicates that the only thing that appears to impact his mood in a positive way is  getting a good night's sleep.  Patient cites his declining physical concerns as "in general my digestive system is not working well lately" and states that he has also been having difficulties with his teeth and his eyesight. Patient reports having no support in the community and living alone.   On discussing recent hospitalization on [facility], patient states that upon discharge  hehad difficulty speaking and walking and states that he fell twice after being discharged.  Patient states "it is better now" and denies experiencing these concerns recently.  Patient expresses that he is unsure if ECT was beneficial.  Patient states that his depression is "very constant" and discusses issues with sleep, memory.  Patient states that the physician Challis prescribed "a different medications" and states he has had difficulty keeping track.  Patient describes feeling overwhelmed with the amount of medications he was taking which is what contributed to noncompliance.  Patient states that he would be interested in receiving a printed out a schedule of his medications.  Patient reports that he continues to experience back pain; however, he states that the pain is much improved after getting medication started.  Patient denies HI/AVH/paranoia.  Discussed with patient that medications will be restarted and his symptoms will be monitored on the [facility].  Discussed at length that if his symptoms do not improve over the next several days that there is a possibility that he will ultimately need a higher level of care than the facility based crisis unit can provide.  Patient verbalized understanding and was in agreement.   Obtained from chart review and patient interview Past Psychiatric History: Previous Medication Trials: yes Previous Psychiatric  Hospitalizations: yes, multiple as above Previous Suicide Attempts: yes - states he cut his abdomen ~6 mo ago as a suicide attempt History of Violence: no Outpatient psychiatrist: NP  Casey Silva  Social History: Marital Status: not married Children: 1 adult adult daughter with minimal contact Source of Income: SSI Education:  12th grade Housing Status: alone Easy access to gun: no  Substance Use (with emphasis over the last 12 months) Recreational Drugs: denied Use of Alcohol: denied Tobacco Use: no Rehab History: n/a H/O Complicated Withdrawal: n/a  Legal History: denied  Family Psychiatric History: Mother with bipolar disorder s able to converse coherently, he has no distractibility or internal preoccupation.      Diagnosis:  Final diagnoses:  Severe episode of recurrent major depressive disorder, with psychotic features ([place])  Generalized anxiety disorder    Total Time spent with patient: 30 minutes  Past Psychiatric History: see h&p Past Medical History:  Past Medical History:  Diagnosis Date   Anxiety    Asthma    Depression    No past surgical history on file. Family History:  Family History  Problem Relation Age of Onset   Bipolar disorder Mother    Family Psychiatric  History: see h&p Social History:  Social History   Substance and Sexual Activity  Alcohol Use No     Social History   Substance and Sexual Activity  Drug Use No    Social History   Socioeconomic History   Marital status: Single    Spouse name: Not on file   Number of children: 1   Years of education: Not on file   Highest education level: GED or equivalent  Occupational History   Not on file  Tobacco Use   Smoking status: Never   Smokeless tobacco: Never  Vaping Use   Vaping Use: Unknown  Substance and Sexual Activity   Alcohol use: No   Drug use: No   Sexual activity: Not Currently  Other Topics Concern   Not on file  Social History Narrative   Marital status:  Single   Children: Daughter   Employment:  Unemployment. previous work for [employer].   Alcohol:  None   Drugs:  none   Exercise:  Regularly very   Religion:  Jewish    Social Determinants of Radio broadcast assistant Strain: Not on file  Food Insecurity: Not on file  Transportation Needs: Not on file  Physical Activity: Not on file  Stress: Not on file  Social Connections: Not on file   SDOH:  SDOH Screenings   Alcohol Screen: Low Risk    Last Alcohol Screening Score (AUDIT): 0  Depression (PHQ2-9): Medium Risk   PHQ-2 Score: 26  Financial Resource Strain: Not on file  Food Insecurity: Not on file  Housing: Not on file  Physical Activity: Not on file  Social Connections: Not on file  Stress: Not on file  Tobacco Use: Low Risk    Smoking Tobacco Use: Never   Smokeless Tobacco Use: Never   Passive Exposure: Not on file  Transportation Needs: Not on file   Additional Social History:                         Sleep: Fair  Appetite:  Fair  Current Medications:  Current Facility-Administered Medications  Medication Dose Route Frequency Provider Last Rate Last Admin   acetaminophen (TYLENOL) tablet 650 mg  650 mg Oral Q6H PRN Nwoko, Terese Door, PA  alum & mag hydroxide-simeth (MAALOX/MYLANTA) 200-200-20 MG/5ML suspension 30 mL  30 mL Oral Q4H PRN Nwoko, Uchenna E, PA       hydrOXYzine (ATARAX) tablet 25 mg  25 mg Oral TID PRN Nwoko, Uchenna E, PA   25 mg at 09/11/21 0043   lithium carbonate (ESKALITH) CR tablet 450 mg  450 mg Oral Q12H Revonda Humphrey, NP   450 mg at 09/12/21 1021   magnesium hydroxide (MILK OF MAGNESIA) suspension 30 mL  30 mL Oral Daily PRN Nwoko, Uchenna E, PA       methocarbamol (ROBAXIN) tablet 500 mg  500 mg Oral BID Revonda Humphrey, NP   500 mg at 09/12/21 1021   mirtazapine (REMERON) tablet 15 mg  15 mg Oral QHS Revonda Humphrey, NP   15 mg at 09/11/21 2119   QUEtiapine (SEROQUEL) tablet 100 mg  100 mg Oral QHS Revonda Humphrey, NP   100 mg at 09/11/21 2119   traZODone (DESYREL) tablet 50 mg  50 mg Oral QHS PRN Nwoko, Uchenna E, PA   50 mg at 09/11/21 0043   Current Outpatient Medications   Medication Sig Dispense Refill   methocarbamol (ROBAXIN) 500 MG tablet Take 1 tablet (500 mg total) by mouth 2 (two) times daily for 5 days. 10 tablet 0    Labs  Lab Results:  Admission on 09/10/2021  Component Date Value Ref Range Status   POC Amphetamine UR 09/11/2021 None Detected  NONE DETECTED (Cut Off Level 1000 ng/mL) Final   POC Secobarbital (BAR) 09/11/2021 None Detected  NONE DETECTED (Cut Off Level 300 ng/mL) Final   POC Buprenorphine (BUP) 09/11/2021 None Detected  NONE DETECTED (Cut Off Level 10 ng/mL) Final   POC Oxazepam (BZO) 09/11/2021 None Detected  NONE DETECTED (Cut Off Level 300 ng/mL) Final   POC Cocaine UR 09/11/2021 None Detected  NONE DETECTED (Cut Off Level 300 ng/mL) Final   POC Methamphetamine UR 09/11/2021 None Detected  NONE DETECTED (Cut Off Level 1000 ng/mL) Final   POC Morphine 09/11/2021 None Detected  NONE DETECTED (Cut Off Level 300 ng/mL) Final   POC Methadone UR 09/11/2021 None Detected  NONE DETECTED (Cut Off Level 300 ng/mL) Final   POC Oxycodone UR 09/11/2021 None Detected  NONE DETECTED (Cut Off Level 100 ng/mL) Final   POC Marijuana UR 09/11/2021 None Detected  NONE DETECTED (Cut Off Level 50 ng/mL) Final  Admission on 09/10/2021, Discharged on 09/10/2021  Component Date Value Ref Range Status   Color, Urine 09/10/2021 STRAW (A)  YELLOW Final   APPearance 09/10/2021 CLEAR  CLEAR Final   Specific Gravity, Urine 09/10/2021 1.008  1.005 - 1.030 Final   pH 09/10/2021 5.0  5.0 - 8.0 Final   Glucose, UA 09/10/2021 NEGATIVE  NEGATIVE mg/dL Final   Hgb urine dipstick 09/10/2021 MODERATE (A)  NEGATIVE Final   Bilirubin Urine 09/10/2021 NEGATIVE  NEGATIVE Final   Ketones, ur 09/10/2021 NEGATIVE  NEGATIVE mg/dL Final   Protein, ur 09/10/2021 NEGATIVE  NEGATIVE mg/dL Final   Nitrite 09/10/2021 NEGATIVE  NEGATIVE Final   Leukocytes,Ua 09/10/2021 NEGATIVE  NEGATIVE Final   RBC / HPF 09/10/2021 0-5  0 - 5 RBC/hpf Final   WBC, UA 09/10/2021 0-5  0 - 5  WBC/hpf Final   Bacteria, UA 09/10/2021 RARE (A)  NONE SEEN Final   Performed at [facility], [place] [address] [place], [place] [address]  Admission on 09/10/2021, Discharged on 09/10/2021  Component Date Value Ref Range Status  SARS Coronavirus 2 by RT PCR 09/10/2021 NEGATIVE  NEGATIVE Final   Comment: (NOTE) SARS-CoV-2 target nucleic acids are NOT DETECTED.  The SARS-CoV-2 RNA is generally detectable in upper respiratory specimens during the acute phase of infection. The lowest concentration of SARS-CoV-2 viral copies this assay can detect is 138 copies/mL. A negative result does not preclude SARS-Cov-2 infection and should not be used as the sole basis for treatment or other patient management decisions. A negative result may occur with  improper specimen collection/handling, submission of specimen other than nasopharyngeal swab, presence of viral mutation(s) within the areas targeted by this assay, and inadequate number of viral copies(<138 copies/mL). A negative result must be combined with clinical observations, patient history, and epidemiological information. The expected result is Negative.  Fact Sheet for Patients:  EntrepreneurPulse.com.au  Fact Sheet for Healthcare Providers:  IncredibleEmployment.be  This test is no                          t yet approved or cleared by the [place] FDA and  has been authorized for detection and/or diagnosis of SARS-CoV-2 by FDA under an Emergency Use Authorization (EUA). This EUA will remain  in effect (meaning this test can be used) for the duration of the COVID-19 declaration under Section 564(b)(1) of the Act, 21 U.S.C.section 360bbb-3(b)(1), unless the authorization is terminated  or revoked sooner.       Influenza A by PCR 09/10/2021 NEGATIVE  NEGATIVE Final   Influenza B by PCR 09/10/2021 NEGATIVE  NEGATIVE Final   Comment: (NOTE) The Xpert Xpress SARS-CoV-2/FLU/RSV plus assay  is intended as an aid in the diagnosis of influenza from Nasopharyngeal swab specimens and should not be used as a sole basis for treatment. Nasal washings and aspirates are unacceptable for Xpert Xpress SARS-CoV-2/FLU/RSV testing.  Fact Sheet for Patients: EntrepreneurPulse.com.au  Fact Sheet for Healthcare Providers: IncredibleEmployment.be  This test is not yet approved or cleared by the [place] FDA and has been authorized for detection and/or diagnosis of SARS-CoV-2 by FDA under an Emergency Use Authorization (EUA). This EUA will remain in effect (meaning this test can be used) for the duration of the COVID-19 declaration under Section 564(b)(1) of the Act, 21 U.S.C. section 360bbb-3(b)(1), unless the authorization is terminated or revoked.  Performed at [facility], [place] [address], [place], [place] [address]    WBC 09/10/2021 6.1  4.0 - 10.5 K/uL Final   RBC 09/10/2021 4.81  4.22 - 5.81 MIL/uL Final   Hemoglobin 09/10/2021 14.5  13.0 - 17.0 g/dL Final   HCT 09/10/2021 42.1  39.0 - 52.0 % Final   MCV 09/10/2021 87.5  80.0 - 100.0 fL Final   MCH 09/10/2021 30.1  26.0 - 34.0 pg Final   MCHC 09/10/2021 34.4  30.0 - 36.0 g/dL Final   RDW 09/10/2021 14.7  11.5 - 15.5 % Final   Platelets 09/10/2021 275  150 - 400 K/uL Final   nRBC 09/10/2021 0.0  0.0 - 0.2 % Final   Neutrophils Relative % 09/10/2021 54  % Final   Neutro Abs 09/10/2021 3.3  1.7 - 7.7 K/uL Final   Lymphocytes Relative 09/10/2021 32  % Final   Lymphs Abs 09/10/2021 1.9  0.7 - 4.0 K/uL Final   Monocytes Relative 09/10/2021 8  % Final   Monocytes Absolute 09/10/2021 0.5  0.1 - 1.0 K/uL Final   Eosinophils Relative 09/10/2021 5  % Final   Eosinophils Absolute 09/10/2021  0.3  0.0 - 0.5 K/uL Final   Basophils Relative 09/10/2021 1  % Final   Basophils Absolute 09/10/2021 0.1  0.0 - 0.1 K/uL Final   Immature Granulocytes 09/10/2021 0  % Final   Abs Immature Granulocytes  09/10/2021 0.02  0.00 - 0.07 K/uL Final   Performed at [facility], [place] [address], [place], [place] [address]   Sodium 09/10/2021 137  135 - 145 mmol/L Final   Potassium 09/10/2021 3.1 (L)  3.5 - 5.1 mmol/L Final   Chloride 09/10/2021 103  98 - 111 mmol/L Final   CO2 09/10/2021 23  22 - 32 mmol/L Final   Glucose, Bld 09/10/2021 119 (H)  70 - 99 mg/dL Final   Glucose reference range applies only to samples taken after fasting for at least 8 hours.   BUN 09/10/2021 15  8 - 23 mg/dL Final   Creatinine, Ser 09/10/2021 1.04  0.61 - 1.24 mg/dL Final   Calcium 09/10/2021 8.9  8.9 - 10.3 mg/dL Final   Total Protein 09/10/2021 6.9  6.5 - 8.1 g/dL Final   Albumin 09/10/2021 4.0  3.5 - 5.0 g/dL Final   AST 09/10/2021 16  15 - 41 U/L Final   ALT 09/10/2021 14  0 - 44 U/L Final   Alkaline Phosphatase 09/10/2021 57  38 - 126 U/L Final   Total Bilirubin 09/10/2021 0.4  0.3 - 1.2 mg/dL Final   GFR, Estimated 09/10/2021 >60  >60 mL/min Final   Comment: (NOTE) Calculated using the CKD-EPI Creatinine Equation (2021)    Anion gap 09/10/2021 11  5 - 15 Final   Performed at [place] [address], [place], [place] [address]   TSH 09/10/2021 1.983  0.350 - 4.500 uIU/mL Final   Comment: Performed by a 3rd Generation assay with a functional sensitivity of <=0.01 uIU/mL. Performed at [facility], [place] [address] [place], [place] [address]    Lithium Lvl 09/10/2021 <0.06 (L)  0.60 - 1.20 mmol/L Final   Performed at [facility] [address], [place], [place] [address]  Admission on 06/23/2021, Discharged on 08/03/2021  Component Date Value Ref Range Status   Glucose-Capillary 06/27/2021 85  70 - 99 mg/dL Final   Glucose reference range applies only to samples taken after fasting for at least 8 hours.   Comment 1 06/27/2021 Notify RN   Final   Glucose-Capillary 06/29/2021 88  70 - 99 mg/dL Final   Glucose reference range applies only to samples taken after fasting for at  least 8 hours.   Glucose-Capillary 06/29/2021 96  70 - 99 mg/dL Final   Glucose reference range applies only to samples taken after fasting for at least 8 hours.   Adenovirus 06/30/2021 NOT DETECTED  NOT DETECTED Final   Coronavirus 229E 06/30/2021 NOT DETECTED  NOT DETECTED Final   Comment: (NOTE) The Coronavirus on the Respiratory Panel, DOES NOT test for the novel  Coronavirus (2019 nCoV)    Coronavirus HKU1 06/30/2021 NOT DETECTED  NOT DETECTED Final   Coronavirus NL63 06/30/2021 NOT DETECTED  NOT DETECTED Final   Coronavirus OC43 06/30/2021 NOT DETECTED  NOT DETECTED Final   Metapneumovirus 06/30/2021 NOT DETECTED  NOT DETECTED Final   Rhinovirus / Enterovirus 06/30/2021 NOT DETECTED  NOT DETECTED Final   Influenza A 06/30/2021 NOT DETECTED  NOT DETECTED Final   Influenza B 06/30/2021 NOT DETECTED  NOT DETECTED Final   Parainfluenza Virus 1 06/30/2021 NOT DETECTED  NOT DETECTED  Final   Parainfluenza Virus 2 06/30/2021 NOT DETECTED  NOT DETECTED Final   Parainfluenza Virus 3 06/30/2021 NOT DETECTED  NOT DETECTED Final   Parainfluenza Virus 4 06/30/2021 NOT DETECTED  NOT DETECTED Final   Respiratory Syncytial Virus 06/30/2021 NOT DETECTED  NOT DETECTED Final   Bordetella pertussis 06/30/2021 NOT DETECTED  NOT DETECTED Final   Bordetella Parapertussis 06/30/2021 NOT DETECTED  NOT DETECTED Final   Chlamydophila pneumoniae 06/30/2021 NOT DETECTED  NOT DETECTED Final   Mycoplasma pneumoniae 06/30/2021 NOT DETECTED  NOT DETECTED Final   Performed at [facility], [place] [address] [place], [place] [address]   HIV Screen 4th Generation wRfx 06/30/2021 Non Reactive  Non Reactive Final   Performed at [facility], [place] [address], [place], [place] [address]   SARS Coronavirus 2 by RT PCR 06/30/2021 NEGATIVE  NEGATIVE Final   Comment: (NOTE) SARS-CoV-2 target nucleic acids are NOT DETECTED.  The SARS-CoV-2 RNA is generally detectable in upper respiratory specimens during the acute  phase of infection. The lowest concentration of SARS-CoV-2 viral copies this assay can detect is 138 copies/mL. A negative result does not preclude SARS-Cov-2 infection and should not be used as the sole basis for treatment or other patient management decisions. A negative result may occur with  improper specimen collection/handling, submission of specimen other than nasopharyngeal swab, presence of viral mutation(s) within the areas targeted by this assay, and inadequate number of viral copies(<138 copies/mL). A negative result must be combined with clinical observations, patient history, and epidemiological information. The expected result is Negative.  Fact Sheet for Patients:  EntrepreneurPulse.com.au  Fact Sheet for Healthcare Providers:  IncredibleEmployment.be  This test is no                          t yet approved or cleared by the [place] FDA and  has been authorized for detection and/or diagnosis of SARS-CoV-2 by FDA under an Emergency Use Authorization (EUA). This EUA will remain  in effect (meaning this test can be used) for the duration of the COVID-19 declaration under Section 564(b)(1) of the Act, 21 U.S.C.section 360bbb-3(b)(1), unless the authorization is terminated  or revoked sooner.       Influenza A by PCR 06/30/2021 NEGATIVE  NEGATIVE Final   Influenza B by PCR 06/30/2021 NEGATIVE  NEGATIVE Final   Comment: (NOTE) The Xpert Xpress SARS-CoV-2/FLU/RSV plus assay is intended as an aid in the diagnosis of influenza from Nasopharyngeal swab specimens and should not be used as a sole basis for treatment. Nasal washings and aspirates are unacceptable for Xpert Xpress SARS-CoV-2/FLU/RSV testing.  Fact Sheet for Patients: EntrepreneurPulse.com.au  Fact Sheet for Healthcare Providers: IncredibleEmployment.be  This test is not yet approved or cleared by the [place] FDA and has  been authorized for detection and/or diagnosis of SARS-CoV-2 by FDA under an Emergency Use Authorization (EUA). This EUA will remain in effect (meaning this test can be used) for the duration of the COVID-19 declaration under Section 564(b)(1) of the Act, 21 U.S.C. section 360bbb-3(b)(1), unless the authorization is terminated or revoked.  Performed at [facility], [place] [place], [place] [address]    Campylobacter species 06/30/2021 NOT DETECTED  NOT DETECTED Final   Plesimonas shigelloides 06/30/2021 NOT DETECTED  NOT DETECTED Final   Salmonella species 06/30/2021 NOT DETECTED  NOT DETECTED Final   Yersinia enterocolitica 06/30/2021 NOT DETECTED  NOT DETECTED Final   Vibrio species 06/30/2021  NOT DETECTED  NOT DETECTED Final   Vibrio cholerae 06/30/2021 NOT DETECTED  NOT DETECTED Final   Enteroaggregative E coli (EAEC) 06/30/2021 NOT DETECTED  NOT DETECTED Final   Enteropathogenic E coli (EPEC) 06/30/2021 NOT DETECTED  NOT DETECTED Final   Enterotoxigenic E coli (ETEC) 06/30/2021 NOT DETECTED  NOT DETECTED Final   Shiga like toxin producing E coli * 06/30/2021 NOT DETECTED  NOT DETECTED Final   Shigella/Enteroinvasive E coli (EI* 06/30/2021 NOT DETECTED  NOT DETECTED Final   Cryptosporidium 06/30/2021 NOT DETECTED  NOT DETECTED Final   Cyclospora cayetanensis 06/30/2021 NOT DETECTED  NOT DETECTED Final   Entamoeba histolytica 06/30/2021 NOT DETECTED  NOT DETECTED Final   Giardia lamblia 06/30/2021 NOT DETECTED  NOT DETECTED Final   Adenovirus F40/41 06/30/2021 NOT DETECTED  NOT DETECTED Final   Astrovirus 06/30/2021 NOT DETECTED  NOT DETECTED Final   Norovirus GI/GII 06/30/2021 NOT DETECTED  NOT DETECTED Final   Rotavirus A 06/30/2021 NOT DETECTED  NOT DETECTED Final   Sapovirus (I, II, IV, and V) 06/30/2021 NOT DETECTED  NOT DETECTED Final   Performed at [facility], [place] [place], [place] [address]   Glucose-Capillary 07/01/2021 82  70 - 99  mg/dL Final   Glucose reference range applies only to samples taken after fasting for at least 8 hours.   Glucose-Capillary 07/04/2021 105 (H)  70 - 99 mg/dL Final   Glucose reference range applies only to samples taken after fasting for at least 8 hours.   Comment 1 07/04/2021 Notify RN   Final   Glucose-Capillary 07/06/2021 90  70 - 99 mg/dL Final   Glucose reference range applies only to samples taken after fasting for at least 8 hours.   Glucose-Capillary 07/08/2021 84  70 - 99 mg/dL Final   Glucose reference range applies only to samples taken after fasting for at least 8 hours.   Glucose-Capillary 07/11/2021 81  70 - 99 mg/dL Final   Glucose reference range applies only to samples taken after fasting for at least 8 hours.   Glucose-Capillary 07/11/2021 91  70 - 99 mg/dL Final   Glucose reference range applies only to samples taken after fasting for at least 8 hours.   Glucose-Capillary 07/13/2021 87  70 - 99 mg/dL Final   Glucose reference range applies only to samples taken after fasting for at least 8 hours.   Glucose-Capillary 07/15/2021 78  70 - 99 mg/dL Final   Glucose reference range applies only to samples taken after fasting for at least 8 hours.   Glucose-Capillary 07/18/2021 92  70 - 99 mg/dL Final   Glucose reference range applies only to samples taken after fasting for at least 8 hours.   TSH 07/26/2021 1.361  0.350 - 4.500 uIU/mL Final   Comment: Performed by a 3rd Generation assay with a functional sensitivity of <=0.01 uIU/mL. Performed at [facility], [place], [place] [address]    Sodium 07/26/2021 137  135 - 145 mmol/L Final   Potassium 07/26/2021 4.1  3.5 - 5.1 mmol/L Final   Chloride 07/26/2021 100  98 - 111 mmol/L Final   CO2 07/26/2021 27  22 - 32 mmol/L Final   Glucose, Bld 07/26/2021 131 (H)  70 - 99 mg/dL Final   Glucose reference range applies only to samples taken after fasting for at least 8 hours.   BUN 07/26/2021 21  8 - 23  mg/dL Final   Creatinine, Ser 07/26/2021 1.06  0.61 - 1.24  mg/dL Final   Calcium 07/26/2021 9.0  8.9 - 10.3 mg/dL Final   GFR, Estimated 07/26/2021 >60  >60 mL/min Final   Comment: (NOTE) Calculated using the CKD-EPI Creatinine Equation (2021)    Anion gap 07/26/2021 10  5 - 15 Final   Performed at [facility], [place], [place] [address]   Lithium Lvl 07/30/2021 0.48 (L)  0.60 - 1.20 mmol/L Final   Performed at [facility], [place] [place], [place] [address]  Admission on 06/07/2021, Discharged on 06/23/2021  Component Date Value Ref Range Status   Sodium 06/09/2021 130 (L)  135 - 145 mmol/L Final   Potassium 06/09/2021 3.7  3.5 - 5.1 mmol/L Final   Chloride 06/09/2021 95 (L)  98 - 111 mmol/L Final   CO2 06/09/2021 23  22 - 32 mmol/L Final   Glucose, Bld 06/09/2021 103 (H)  70 - 99 mg/dL Final   Glucose reference range applies only to samples taken after fasting for at least 8 hours.   BUN 06/09/2021 10  8 - 23 mg/dL Final   Creatinine, Ser 06/09/2021 0.89  0.61 - 1.24 mg/dL Final   Calcium 06/09/2021 9.8  8.9 - 10.3 mg/dL Final   GFR, Estimated 06/09/2021 >60  >60 mL/min Final   Comment: (NOTE) Calculated using the CKD-EPI Creatinine Equation (2021)    Anion gap 06/09/2021 12  5 - 15 Final   Performed at [facility], [place] [address], [place], [place] [address]   Vitamin B-12 06/10/2021 325  180 - 914 pg/mL Final   Comment: (NOTE) This assay is not validated for testing neonatal or myeloproliferative syndrome specimens for Vitamin B12 levels. Performed at [facility], [place] [address] [place], [place] [address]    Vit D, 25-Hydroxy 06/10/2021 14.94 (L)  30 - 100 ng/mL Final   Comment: (NOTE) Vitamin D deficiency has been defined by the [place] practice guideline as a level of serum 25-OH  vitamin D less than 20 ng/mL (1,2). The Endocrine Society went on to   further define vitamin D insufficiency as a level between 21 and 29  ng/mL (2).  1. IOM (Institute of Medicine). 2010. Dietary reference intakes for  calcium and D. [place]: The [employer]. 2. Holick MF, Binkley Garfield, Bischoff-Ferrari HA, et al. Evaluation,  treatment, and prevention of vitamin D deficiency: an Endocrine  Society clinical practice guideline, JCEM. 2011 Jul; 96(7): 1911-30.  Performed at [facility], [place] [address] [place], [place] [address]    Folate 06/10/2021 9.8  >5.9 ng/mL Final   Performed at [facility], [place] [address], [place], [place] [address]   Vitamin B1 (Thiamine) 06/10/2021 121.6  66.5 - 200.0 nmol/L Final   Comment: (NOTE) This test was developed and its performance characteristics determined by [facility]. It has not been cleared or approved by the Food and Drug Administration. Performed At: [facility] [place], [place] [id number] Rush Farmer MD [id number]    Sodium 06/12/2021 133 (L)  135 - 145 mmol/L Final   Potassium 06/12/2021 3.6  3.5 - 5.1 mmol/L Final   Chloride 06/12/2021 99  98 - 111 mmol/L Final   CO2 06/12/2021 28  22 - 32 mmol/L Final   Glucose, Bld 06/12/2021 94  70 - 99 mg/dL Final   Glucose reference range applies only to samples taken after fasting for at least 8 hours.  BUN 06/12/2021 14  8 - 23 mg/dL Final   Creatinine, Ser 06/12/2021 1.03  0.61 - 1.24 mg/dL Final   Calcium 06/12/2021 8.9  8.9 - 10.3 mg/dL Final   GFR, Estimated 06/12/2021 >60  >60 mL/min Final   Comment: (NOTE) Calculated using the CKD-EPI Creatinine Equation (2021)    Anion gap 06/12/2021 6  5 - 15 Final   Performed at [facility], [place] [address], [place], [place] [address]   Sodium 06/16/2021 135  135 - 145 mmol/L Final   Potassium 06/16/2021 3.6  3.5 - 5.1 mmol/L Final   Chloride 06/16/2021 102  98 - 111 mmol/L Final   CO2 06/16/2021 24  22 - 32 mmol/L Final    Glucose, Bld 06/16/2021 84  70 - 99 mg/dL Final   Glucose reference range applies only to samples taken after fasting for at least 8 hours.   BUN 06/16/2021 16  8 - 23 mg/dL Final   Creatinine, Ser 06/16/2021 0.86  0.61 - 1.24 mg/dL Final   Calcium 06/16/2021 8.9  8.9 - 10.3 mg/dL Final   Total Protein 06/16/2021 6.5  6.5 - 8.1 g/dL Final   Albumin 06/16/2021 3.7  3.5 - 5.0 g/dL Final   AST 06/16/2021 18  15 - 41 U/L Final   ALT 06/16/2021 20  0 - 44 U/L Final   Alkaline Phosphatase 06/16/2021 53  38 - 126 U/L Final   Total Bilirubin 06/16/2021 0.3  0.3 - 1.2 mg/dL Final   GFR, Estimated 06/16/2021 >60  >60 mL/min Final   Comment: (NOTE) Calculated using the CKD-EPI Creatinine Equation (2021)    Anion gap 06/16/2021 9  5 - 15 Final   Performed at [facility], [place] [address] [place], [place] [address]   SARS Coronavirus 2 by RT PCR 06/21/2021 NEGATIVE  NEGATIVE Final   Comment: (NOTE) SARS-CoV-2 target nucleic acids are NOT DETECTED.  The SARS-CoV-2 RNA is generally detectable in upper respiratory specimens during the acute phase of infection. The lowest concentration of SARS-CoV-2 viral copies this assay can detect is 138 copies/mL. A negative result does not preclude SARS-Cov-2 infection and should not be used as the sole basis for treatment or other patient management decisions. A negative result may occur with  improper specimen collection/handling, submission of specimen other than nasopharyngeal swab, presence of viral mutation(s) within the areas targeted by this assay, and inadequate number of viral copies(<138 copies/mL). A negative result must be combined with clinical observations, patient history, and epidemiological information. The expected result is Negative.  Fact Sheet for Patients:  EntrepreneurPulse.com.au  Fact Sheet for Healthcare Providers:  IncredibleEmployment.be  This test is no                           t yet approved or cleared by the [place] FDA and  has been authorized for detection and/or diagnosis of SARS-CoV-2 by FDA under an Emergency Use Authorization (EUA). This EUA will remain  in effect (meaning this test can be used) for the duration of the COVID-19 declaration under Section 564(b)(1) of the Act, 21 U.S.C.section 360bbb-3(b)(1), unless the authorization is terminated  or revoked sooner.       Influenza A by PCR 06/21/2021 NEGATIVE  NEGATIVE Final   Influenza B by PCR 06/21/2021 NEGATIVE  NEGATIVE Final   Comment: (NOTE) The Xpert Xpress SARS-CoV-2/FLU/RSV plus assay is intended as an aid in the diagnosis of influenza from Nasopharyngeal swab  specimens and should not be used as a sole basis for treatment. Nasal washings and aspirates are unacceptable for Xpert Xpress SARS-CoV-2/FLU/RSV testing.  Fact Sheet for Patients: EntrepreneurPulse.com.au  Fact Sheet for Healthcare Providers: IncredibleEmployment.be  This test is not yet approved or cleared by the [place] FDA and has been authorized for detection and/or diagnosis of SARS-CoV-2 by FDA under an Emergency Use Authorization (EUA). This EUA will remain in effect (meaning this test can be used) for the duration of the COVID-19 declaration under Section 564(b)(1) of the Act, 21 U.S.C. section 360bbb-3(b)(1), unless the authorization is terminated or revoked.  Performed at [facility], [place] [address], [place], [place] [address]    Sodium 06/23/2021 137  135 - 145 mmol/L Final   Potassium 06/23/2021 4.0  3.5 - 5.1 mmol/L Final   Chloride 06/23/2021 103  98 - 111 mmol/L Final   CO2 06/23/2021 28  22 - 32 mmol/L Final   Glucose, Bld 06/23/2021 85  70 - 99 mg/dL Final   Glucose reference range applies only to samples taken after fasting for at least 8 hours.   BUN 06/23/2021 14  8 - 23 mg/dL Final   Creatinine, Ser 06/23/2021 0.85  0.61 - 1.24  mg/dL Final   Calcium 06/23/2021 9.1  8.9 - 10.3 mg/dL Final   GFR, Estimated 06/23/2021 >60  >60 mL/min Final   Comment: (NOTE) Calculated using the CKD-EPI Creatinine Equation (2021)    Anion gap 06/23/2021 6  5 - 15 Final   Performed at [facility], [place] [address] [place], [place] [address]   SARS Coronavirus 2 by RT PCR 06/23/2021 NEGATIVE  NEGATIVE Final   Comment: (NOTE) SARS-CoV-2 target nucleic acids are NOT DETECTED.  The SARS-CoV-2 RNA is generally detectable in upper respiratory specimens during the acute phase of infection. The lowest concentration of SARS-CoV-2 viral copies this assay can detect is 138 copies/mL. A negative result does not preclude SARS-Cov-2 infection and should not be used as the sole basis for treatment or other patient management decisions. A negative result may occur with  improper specimen collection/handling, submission of specimen other than nasopharyngeal swab, presence of viral mutation(s) within the areas targeted by this assay, and inadequate number of viral copies(<138 copies/mL). A negative result must be combined with clinical observations, patient history, and epidemiological information. The expected result is Negative.  Fact Sheet for Patients:  EntrepreneurPulse.com.au  Fact Sheet for Healthcare Providers:  IncredibleEmployment.be  This test is no                          t yet approved or cleared by the [place] FDA and  has been authorized for detection and/or diagnosis of SARS-CoV-2 by FDA under an Emergency Use Authorization (EUA). This EUA will remain  in effect (meaning this test can be used) for the duration of the COVID-19 declaration under Section 564(b)(1) of the Act, 21 U.S.C.section 360bbb-3(b)(1), unless the authorization is terminated  or revoked sooner.       Influenza A by PCR 06/23/2021 NEGATIVE  NEGATIVE Final   Influenza B by PCR 06/23/2021  NEGATIVE  NEGATIVE Final   Comment: (NOTE) The Xpert Xpress SARS-CoV-2/FLU/RSV plus assay is intended as an aid in the diagnosis of influenza from Nasopharyngeal swab specimens and should not be used as a sole basis for treatment. Nasal washings and aspirates are unacceptable for Xpert Xpress SARS-CoV-2/FLU/RSV testing.  Fact Sheet for Patients: EntrepreneurPulse.com.au  Fact Sheet for Healthcare Providers: IncredibleEmployment.be  This test is not yet approved or cleared by the [place] FDA and has been authorized for detection and/or diagnosis of SARS-CoV-2 by FDA under an Emergency Use Authorization (EUA). This EUA will remain in effect (meaning this test can be used) for the duration of the COVID-19 declaration under Section 564(b)(1) of the Act, 21 U.S.C. section 360bbb-3(b)(1), unless the authorization is terminated or revoked.  Performed at [facility], [place] [address], [place], [place] [address]   Admission on 06/07/2021, Discharged on 06/07/2021  Component Date Value Ref Range Status   Sodium 06/07/2021 128 (L)  135 - 145 mmol/L Final   Potassium 06/07/2021 3.6  3.5 - 5.1 mmol/L Final   Chloride 06/07/2021 93 (L)  98 - 111 mmol/L Final   CO2 06/07/2021 21 (L)  22 - 32 mmol/L Final   Glucose, Bld 06/07/2021 110 (H)  70 - 99 mg/dL Final   Glucose reference range applies only to samples taken after fasting for at least 8 hours.   BUN 06/07/2021 10  8 - 23 mg/dL Final   Creatinine, Ser 06/07/2021 1.24  0.61 - 1.24 mg/dL Final   Calcium 06/07/2021 9.3  8.9 - 10.3 mg/dL Final   Total Protein 06/07/2021 7.5  6.5 - 8.1 g/dL Final   Albumin 06/07/2021 4.5  3.5 - 5.0 g/dL Final   AST 06/07/2021 22  15 - 41 U/L Final   ALT 06/07/2021 12  0 - 44 U/L Final   Alkaline Phosphatase 06/07/2021 63  38 - 126 U/L Final   Total Bilirubin 06/07/2021 0.7  0.3 - 1.2 mg/dL Final   GFR, Estimated 06/07/2021 >60  >60 mL/min Final    Comment: (NOTE) Calculated using the CKD-EPI Creatinine Equation (2021)    Anion gap 06/07/2021 14  5 - 15 Final   Performed at [facility], [place] [address], [place], [place] [address]   Alcohol, Ethyl (B) 06/07/2021 <10  <10 mg/dL Final   Comment: (NOTE) Lowest detectable limit for serum alcohol is 10 mg/dL.  For medical purposes only. Performed at [facility], [place] [address] [place], [place] [address]    Salicylate Lvl 24/12/7351 <7.0 (L)  7.0 - 30.0 mg/dL Final   Performed at [place] [address] [place], [place] [address]   Acetaminophen (Tylenol), Serum 06/07/2021 17  10 - 30 ug/mL Final   Comment: (NOTE) Therapeutic concentrations vary significantly. A range of 10-30 ug/mL  may be an effective concentration for many patients. However, some  are best treated at concentrations outside of this range. Acetaminophen concentrations >150 ug/mL at 4 hours after ingestion  and >50 ug/mL at 12 hours after ingestion are often associated with  toxic reactions.  Performed at [facility], [place] [address], [place], [place] [address]    WBC 06/07/2021 6.5  4.0 - 10.5 K/uL Final   RBC 06/07/2021 5.01  4.22 - 5.81 MIL/uL Final   Hemoglobin 06/07/2021 14.0  13.0 - 17.0 g/dL Final   HCT 06/07/2021 41.3  39.0 - 52.0 % Final   MCV 06/07/2021 82.4  80.0 - 100.0 fL Final   MCH 06/07/2021 27.9  26.0 - 34.0 pg Final   MCHC 06/07/2021 33.9  30.0 - 36.0 g/dL Final   RDW 06/07/2021 13.6  11.5 - 15.5 % Final   Platelets 06/07/2021 333  150 - 400 K/uL Final   nRBC 06/07/2021 0.0  0.0 - 0.2 % Final  Performed at [facility], [place] [address], [place], [place] [address]   Opiates 06/07/2021 NONE DETECTED  NONE DETECTED Final   Cocaine 06/07/2021 NONE DETECTED  NONE DETECTED Final   Benzodiazepines 06/07/2021 NONE DETECTED  NONE DETECTED Final   Amphetamines 06/07/2021 NONE DETECTED  NONE DETECTED  Final   Tetrahydrocannabinol 06/07/2021 NONE DETECTED  NONE DETECTED Final   Barbiturates 06/07/2021 NONE DETECTED  NONE DETECTED Final   Comment: (NOTE) DRUG SCREEN FOR MEDICAL PURPOSES ONLY.  IF CONFIRMATION IS NEEDED FOR ANY PURPOSE, NOTIFY LAB WITHIN 5 DAYS.  LOWEST DETECTABLE LIMITS FOR URINE DRUG SCREEN Drug Class                     Cutoff (ng/mL) Amphetamine and metabolites    1000 Barbiturate and metabolites    200 Benzodiazepine                 147 Tricyclics and metabolites     300 Opiates and metabolites        300 Cocaine and metabolites        300 THC                            50 Performed at [facility], [place] [address], [place], [place] [address]    SARS Coronavirus 2 by RT PCR 06/07/2021 NEGATIVE  NEGATIVE Final   Comment: (NOTE) SARS-CoV-2 target nucleic acids are NOT DETECTED.  The SARS-CoV-2 RNA is generally detectable in upper respiratory specimens during the acute phase of infection. The lowest concentration of SARS-CoV-2 viral copies this assay can detect is 138 copies/mL. A negative result does not preclude SARS-Cov-2 infection and should not be used as the sole basis for treatment or other patient management decisions. A negative result may occur with  improper specimen collection/handling, submission of specimen other than nasopharyngeal swab, presence of viral mutation(s) within the areas targeted by this assay, and inadequate number of viral copies(<138 copies/mL). A negative result must be combined with clinical observations, patient history, and epidemiological information. The expected result is Negative.  Fact Sheet for Patients:  EntrepreneurPulse.com.au  Fact Sheet for Healthcare Providers:  IncredibleEmployment.be  This test is no                          t yet approved or cleared by the [place] FDA and  has been authorized for detection and/or diagnosis of SARS-CoV-2 by FDA  under an Emergency Use Authorization (EUA). This EUA will remain  in effect (meaning this test can be used) for the duration of the COVID-19 declaration under Section 564(b)(1) of the Act, 21 U.S.C.section 360bbb-3(b)(1), unless the authorization is terminated  or revoked sooner.       Influenza A by PCR 06/07/2021 NEGATIVE  NEGATIVE Final   Influenza B by PCR 06/07/2021 NEGATIVE  NEGATIVE Final   Comment: (NOTE) The Xpert Xpress SARS-CoV-2/FLU/RSV plus assay is intended as an aid in the diagnosis of influenza from Nasopharyngeal swab specimens and should not be used as a sole basis for treatment. Nasal washings and aspirates are unacceptable for Xpert Xpress SARS-CoV-2/FLU/RSV testing.  Fact Sheet for Patients: EntrepreneurPulse.com.au  Fact Sheet for Healthcare Providers: IncredibleEmployment.be  This test is not yet approved or cleared by the [place] FDA and has been authorized for detection and/or diagnosis of SARS-CoV-2 by FDA under an Emergency Use Authorization (EUA). This EUA will  remain in effect (meaning this test can be used) for the duration of the COVID-19 declaration under Section 564(b)(1) of the Act, 21 U.S.C. section 360bbb-3(b)(1), unless the authorization is terminated or revoked.  Performed at [facility], [place] [address] [place], [place] [address]    Magnesium 06/07/2021 1.7  1.7 - 2.4 mg/dL Final   Performed at [place] [address] [place], [place] [address]   Sodium 06/07/2021 131 (L)  135 - 145 mmol/L Final   Potassium 06/07/2021 3.8  3.5 - 5.1 mmol/L Final   Chloride 06/07/2021 97 (L)  98 - 111 mmol/L Final   CO2 06/07/2021 25  22 - 32 mmol/L Final   Glucose, Bld 06/07/2021 187 (H)  70 - 99 mg/dL Final   Glucose reference range applies only to samples taken after fasting for at least 8 hours.   BUN 06/07/2021 8  8 - 23 mg/dL Final   Creatinine, Ser 06/07/2021  1.11  0.61 - 1.24 mg/dL Final   Calcium 06/07/2021 9.3  8.9 - 10.3 mg/dL Final   GFR, Estimated 06/07/2021 >60  >60 mL/min Final   Comment: (NOTE) Calculated using the CKD-EPI Creatinine Equation (2021)    Anion gap 06/07/2021 9  5 - 15 Final   Performed at [facility], [place] [address] [place], [place] [address]   Magnesium 06/07/2021 1.7  1.7 - 2.4 mg/dL Final   Performed at [place] [address] [place], [place] [address]   Cholesterol 06/07/2021 197  0 - 200 mg/dL Final   Triglycerides 06/07/2021 149  <150 mg/dL Final   HDL 06/07/2021 44  >40 mg/dL Final   Total CHOL/HDL Ratio 06/07/2021 4.5  RATIO Final   VLDL 06/07/2021 30  0 - 40 mg/dL Final   LDL Cholesterol 06/07/2021 123 (H)  0 - 99 mg/dL Final   Comment:        Total Cholesterol/HDL:CHD Risk Coronary Heart Disease Risk Table                     Men   Women  1/2 Average Risk   3.4   3.3  Average Risk       5.0   4.4  2 X Average Risk   9.6   7.1  3 X Average Risk  23.4   11.0        Use the calculated Patient Ratio above and the CHD Risk Table to determine the patient's CHD Risk.        ATP III CLASSIFICATION (LDL):  <100     mg/dL   Optimal  100-129  mg/dL   Near or Above                    Optimal  130-159  mg/dL   Borderline  160-189  mg/dL   High  >190     mg/dL   Very High Performed at [place] [address], [place], [place] [address]    TSH 06/07/2021 1.328  0.350 - 4.500 uIU/mL Final   Comment: Performed by a 3rd Generation assay with a functional sensitivity of <=0.01 uIU/mL. Performed at [facility], [place] [address] [place], [place] [address]    Hgb A1c MFr Bld 06/07/2021 5.1  4.8 - 5.6 % Final   Comment: (NOTE) Pre diabetes:          5.7%-6.4%  Diabetes:              >  6.4%  Glycemic control for   <7.0% adults with diabetes    Mean Plasma Glucose 06/07/2021 99.67  mg/dL Final   Performed at [facility], [place] [address] [place], [place] [address]    Blood Alcohol level:  Lab Results  Component Value Date   ETH <10 06/07/2021   ETH <10 71/69/6789    Metabolic Disorder Labs: Lab Results  Component Value Date   HGBA1C 5.1 06/07/2021   MPG 99.67 06/07/2021   MPG 111.15 11/24/2020   No results found for: PROLACTIN Lab Results  Component Value Date   CHOL 197 06/07/2021   TRIG 149 06/07/2021   HDL 44 06/07/2021   CHOLHDL 4.5 06/07/2021   VLDL 30 06/07/2021   LDLCALC 123 (H) 06/07/2021   LDLCALC 148 (H) 11/24/2020    Therapeutic Lab Levels: Lab Results  Component Value Date   LITHIUM <0.06 (L) 09/10/2021   LITHIUM 0.48 (L) 07/30/2021   No results found for: VALPROATE No components found for:  CBMZ  Physical Findings   AIMS    Flowsheet Row Admission (Discharged) from 06/07/2021 in [place] [facility] Admission (Discharged) from 11/23/2020 in [place] [facility]  AIMS Total Score 7 0      AUDIT    Flowsheet Row Admission (Discharged) from 06/23/2021 in [place] Admission (Discharged) from 06/07/2021 in [place] 400B Admission (Discharged) from 11/23/2020 in [place] 400B Admission (Discharged) from 09/24/2019 in [place] 300B  Alcohol Use Disorder Identification Test Final Score (AUDIT) 0 0 0 0      ECT-MADRS    Flowsheet Row Admission (Discharged) from 06/23/2021 in [place] Total Score 44      GAD-7    Flowsheet Row Video Visit from 03/30/2021 in [place] from 12/28/2020 in [place] from 11/22/2020 in [facility] Video Visit from 09/20/2020 in [facility] Video Visit from 06/23/2020 in [facility]  Total GAD-7 Score '14 18 20 7 8      '$ Mini-Mental    [place] Admission (Discharged) from 06/23/2021 in [place]  Total Score (max 30 points ) 30      PHQ2-9    [place] ED from 09/10/2021 in [facility] ED from 06/07/2021 in [place] DEPT Video Visit from 03/30/2021 in [place] from 12/28/2020 in [facility] ED from 11/22/2020 in [facility]  PHQ-2 Total Score '6 6 5 6 6  '$ PHQ-9 Total Score '26 19 20 24 27      '$ [place] [facility] ED from 09/10/2021 in [facility] Most recent reading at 09/11/2021 12:32 AM ED from 09/10/2021 in [place] Most recent reading at 09/10/2021  5:49 PM ED from 09/10/2021 in [facility] Most recent reading at 09/10/2021  4:28 PM  C-SSRS RISK CATEGORY Low Risk No Risk High Risk        Musculoskeletal  Strength & Muscle Tone: within normal limits Gait & Station: normal Patient leans: N/A  Psychiatric Specialty Exam  Presentation  General Appearance: Casual; Disheveled  Eye Contact:Minimal  Speech:Clear and Coherent; Slow  Speech Volume:Normal  Handedness:Right   Mood and Affect  Mood:Depressed  Affect:Flat   Thought Process  Thought Processes:Coherent; Goal Directed; Linear  Descriptions of Associations:Intact  Orientation:Full (Time, Place and Person)  Thought Content:Logical  Diagnosis of Schizophrenia or Schizoaffective disorder in past: No  Duration of Psychotic Symptoms: Less than six months   Hallucinations:Hallucinations: None Description of Visual Hallucinations: Patient states that he occasionally sees things at the corner of his eyes  Ideas of Reference:None  Suicidal Thoughts:Suicidal Thoughts: Yes, Passive SI Passive  Intent and/or Plan: Without Plan; Without Means to Carry Out; Without Access to Means; Without Intent  Homicidal Thoughts:Homicidal Thoughts: No   Sensorium  Memory:Immediate Fair; Recent Fair; Remote Fair  Judgment:Fair  Insight:Fair   Executive Functions  Concentration:Fair  Attention Span:Fair; Good  Recall:Good  Fund of Knowledge:Good  Language:Good   Psychomotor Activity  Psychomotor Activity:Psychomotor Activity: Decreased; Psychomotor Retardation   Assets  Assets:Communication Skills; Desire for Improvement; Resilience   Sleep  Sleep:Sleep: Poor   Nutritional Assessment (For OBS and FBC admissions only) Has the patient had a weight loss or gain of 10 pounds or more in the last 3 months?: No Has the patient had a decrease in food intake/or appetite?: Yes Does the patient have dental problems?: Yes Does the patient have eating habits or behaviors that may be indicators of an eating disorder including binging or inducing vomiting?: No Has the patient recently lost weight without trying?: 0 Has the patient been eating poorly because of a decreased appetite?: 1 Malnutrition Screening Tool Score: 1    Physical Exam  Physical Exam Constitutional:      Appearance: Normal appearance. He is normal weight.  HENT:     Head: Normocephalic and atraumatic.  Eyes:     Extraocular Movements: Extraocular movements intact.  Pulmonary:     Effort: Pulmonary effort is normal.  Neurological:     General: No focal deficit present.     Mental Status: He is alert and oriented to person, place, and time.  Psychiatric:        Attention and Perception: Attention and perception normal.        Speech: Speech normal.        Behavior: Behavior normal. Behavior is cooperative.        Thought Content: Thought content normal.   Review of Systems  Constitutional:  Negative for chills and fever.  HENT:  Negative for hearing loss.   Eyes:  Negative for discharge and redness.   Respiratory:  Negative for cough.   Cardiovascular:  Negative for chest pain.  Gastrointestinal:  Negative for abdominal pain.  Musculoskeletal:  Positive for back pain and myalgias.  Neurological:  Negative for headaches.  Psychiatric/Behavioral:  Positive for depression and suicidal ideas. The patient has insomnia.   Blood pressure 117/80, pulse 65, temperature 97.9 F (36.6 C), temperature source Tympanic, resp. rate 18, SpO2 100 %. There is no height or weight on file to calculate BMI.  Treatment Plan Summary: Casey Silva 66 y.o., male  with h/o bipolar 1 disorder, MDD with psychotic features, psychosis, GAD, and suicidal ideation patient presented to [facility] on 09/10/2021 with complaints of  SI and increased depression in the setting of medication non compliance.  He was [facility] to the [facility].  Shortly after being admitted patient was complaining of back pain and difficulty urinating.  He was sent to the [facility] emergency department for medical clearance.  He was cleared and prescribed Robaxin 500 mg twice daily and was transferred back to the [facility] on 6/3. UDSneg.  Patient  continues to report passive SI with no intent or plan although reports worsening depressive sx. He denies HI/AVH/psychotic sx. Will increase dose of remeron and seroquel to previous dose as reportedly he had been medication compliant with these. Patient with a h/o TRD and needing ECT. Patient continues to be appropriate for [facility] admission for crisis stabilization; however, may be in need of higher level of care if sx do not improve in the next several days.  Bipolar depression vs mdd with psychotic features -continue lithium 450 BID for mood  -Li level <0.06 on admission indicating non compliance  -will need Li level drawn on 6/8 or prior to dc -increase seroquel to previous dose of 300 mg qhs -increase home remeron to previous dose of 45 mg daily -continue venlafaxine 75 mg daily (previously on 225 mg daily)--will monitor  effect and likely increase in the next couple days -PRN trazodone 50 mg for insomnia  HTN -continue amlodipine 10 mg daily  GERD -continue famotidine 20 mg daily   Back Pain Recommendations as below from [facility] ED -Lidocaine 5% patch x2 daily was ordered for  lower back daily - Robaxin 500 mg twice daily     Dispo: ongoing. LCSW assisting. Pending improvement in symptomatology    Ival Bible, MD 09/12/2021 11:48 AM

## 2021-09-12 NOTE — ED Notes (Signed)
Patient is awake and alert on unit.  He is presently eating breakfast in day room.  Patient is calm and pleasant on approach however continues to endorse depressive symptoms.  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  Patient denied avh shi or plan at this time.  Encouraged him to attend to ADL's as he is disheveled and malodorous.  Will monitor patient and provide support as needed.

## 2021-09-12 NOTE — ED Notes (Signed)
Patient remains safe on unit, denies SI, HI and AVH. Patient seemed Isolative. Patient had a shower and told the nurse "Am thankful for shower. I feel much better. Patient is compliant with medications now in bed appears asleep. Will continue to monitor and update as need.

## 2021-09-12 NOTE — Clinical Social Work Psych Note (Signed)
LCSW Initial Note   LCSW met with Casey Silva for introduction and to begin discussions regarding treatment and potential discharge planning.   Casey Silva presented with a depressed affect, congruent mood. Casey Silva endorsed having passive suicidal ideations, stating "I have no motivation to live and I wish it would all end". Casey Silva denied having any HI or AVH at this time.   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 Casey Silva reports that his last encounter with the hospital was at [facility] from 06/23/21 to 08/03/21, where he received inpatient ECT services. Casey Silva reports that the ECT treatments did not appear to be very beneficial. He also reported having difficulty walking and speaking after his ECT treatments.   Since his discharge from [facility],  Casey Silva reports noncompliance with medications due to struggling with remembering his medication regiment. However, Casey Silva does report taking his prescribed dosages of Remeron and Seroquel to assist with addressing his reported insomnia. Casey Silva reports taking his other psychotropic medications almost three weeks ago.   Casey Silva reports that his overall health is declining, both physical and mentally and that it is challenging making sure he maintains his medication regiment. Dr. Serafina Mitchell and LCSW shared with Casey Silva that a medication schedule can be provided to him to assist with remembering to take his medications. Casey Silva expressed the medication schedule would be helpful.   Casey Silva was adamant that he struggles with his memory and that he becomes fatigued very quickly.   Casey Silva is currently unemployed, however reports receiving his social security income to support himself. Casey Silva shared that he currently lives alone and does not have any additional supports, friends or family members.   LCSW and Dr. Serafina Mitchell, MD shared with Casey Silva that providers will continue to observe his progression to determine if he will require a higher level of care. Casey Silva expressed  understanding and was agreeable with that plan of care.   Casey Silva denied having any additional questions or concerns at this time.   LCSW will continue to follow for any additional questions, concerns or identified needs.    Radonna Ricker, MSW, LCSW Clinical Education officer, museum ([place]) [facility]

## 2021-09-13 ENCOUNTER — Other Ambulatory Visit: Payer: Self-pay

## 2021-09-13 ENCOUNTER — Inpatient Hospital Stay
Admission: AD | Admit: 2021-09-13 | Discharge: 2021-09-20 | DRG: 885 | Disposition: A | Discharge status: Home / Self Care | Payer: 59 | Source: Intra-hospital | Attending: Psychiatry | Admitting: Psychiatry

## 2021-09-13 ENCOUNTER — Encounter: Payer: Self-pay | Admitting: Psychiatry

## 2021-09-13 DIAGNOSIS — J45909 Unspecified asthma, uncomplicated: Secondary | ICD-10-CM | POA: Diagnosis present

## 2021-09-13 DIAGNOSIS — Z79899 Other long term (current) drug therapy: Secondary | ICD-10-CM | POA: Diagnosis not present

## 2021-09-13 DIAGNOSIS — Z818 Family history of other mental and behavioral disorders: Secondary | ICD-10-CM | POA: Diagnosis not present

## 2021-09-13 DIAGNOSIS — Z91030 Bee allergy status: Secondary | ICD-10-CM | POA: Diagnosis not present

## 2021-09-13 DIAGNOSIS — F333 Major depressive disorder, recurrent, severe with psychotic symptoms: Principal | ICD-10-CM | POA: Diagnosis present

## 2021-09-13 DIAGNOSIS — E860 Dehydration: Secondary | ICD-10-CM | POA: Diagnosis present

## 2021-09-13 DIAGNOSIS — F332 Major depressive disorder, recurrent severe without psychotic features: Principal | ICD-10-CM | POA: Diagnosis present

## 2021-09-13 DIAGNOSIS — Z20822 Contact with and (suspected) exposure to covid-19: Secondary | ICD-10-CM | POA: Diagnosis present

## 2021-09-13 DIAGNOSIS — R45851 Suicidal ideations: Secondary | ICD-10-CM | POA: Diagnosis present

## 2021-09-13 DIAGNOSIS — Z91048 Other nonmedicinal substance allergy status: Secondary | ICD-10-CM

## 2021-09-13 DIAGNOSIS — K5900 Constipation, unspecified: Secondary | ICD-10-CM | POA: Diagnosis present

## 2021-09-13 DIAGNOSIS — F411 Generalized anxiety disorder: Secondary | ICD-10-CM | POA: Diagnosis not present

## 2021-09-13 DIAGNOSIS — Z599 Problem related to housing and economic circumstances, unspecified: Secondary | ICD-10-CM

## 2021-09-13 DIAGNOSIS — G4700 Insomnia, unspecified: Secondary | ICD-10-CM | POA: Diagnosis present

## 2021-09-13 DIAGNOSIS — M549 Dorsalgia, unspecified: Secondary | ICD-10-CM | POA: Diagnosis present

## 2021-09-13 DIAGNOSIS — F419 Anxiety disorder, unspecified: Secondary | ICD-10-CM | POA: Diagnosis present

## 2021-09-13 LAB — RESP PANEL BY RT-PCR (FLU A&B, COVID) ARPGX2
Influenza A by PCR: NEGATIVE
Influenza B by PCR: NEGATIVE
SARS Coronavirus 2 by RT PCR: NEGATIVE

## 2021-09-13 MED ORDER — MIRTAZAPINE 15 MG PO TABS
45.0000 mg | ORAL_TABLET | Freq: Every day | ORAL | Status: DC
  Administered 2021-09-13 – 2021-09-19 (×7): 45 mg via ORAL
  Filled 2021-09-13 (×7): qty 3

## 2021-09-13 MED ORDER — ALUM & MAG HYDROXIDE-SIMETH 200-200-20 MG/5ML PO SUSP: 30.0000 mL | ORAL | Status: DC | PRN

## 2021-09-13 MED ORDER — QUETIAPINE FUMARATE 100 MG PO TABS
300.0000 mg | ORAL_TABLET | Freq: Every day | ORAL | Status: DC
  Administered 2021-09-13 – 2021-09-19 (×7): 300 mg via ORAL
  Filled 2021-09-13 (×7): qty 3

## 2021-09-13 MED ORDER — AMLODIPINE BESYLATE 10 MG PO TABS
10.0000 mg | ORAL_TABLET | Freq: Every day | ORAL | Status: DC
  Administered 2021-09-13: 10 mg via ORAL
  Filled 2021-09-13: qty 1

## 2021-09-13 MED ORDER — FAMOTIDINE 20 MG PO TABS
20.0000 mg | ORAL_TABLET | Freq: Every day | ORAL | Status: DC
  Administered 2021-09-13: 20 mg via ORAL
  Filled 2021-09-13: qty 1

## 2021-09-13 MED ORDER — ACETAMINOPHEN 325 MG PO TABS: 650.0000 mg | ORAL_TABLET | Freq: Four times a day (QID) | ORAL | Status: DC | PRN

## 2021-09-13 MED ORDER — VENLAFAXINE HCL ER 75 MG PO CP24: 75.0000 mg | ORAL_CAPSULE | Freq: Every day | ORAL | Status: DC

## 2021-09-13 MED ORDER — TRAZODONE HCL 50 MG PO TABS
50.0000 mg | ORAL_TABLET | Freq: Every evening | ORAL | Status: DC | PRN
  Administered 2021-09-17 – 2021-09-19 (×3): 50 mg via ORAL
  Filled 2021-09-13 (×3): qty 1

## 2021-09-13 MED ORDER — VENLAFAXINE HCL ER 75 MG PO CP24
75.0000 mg | ORAL_CAPSULE | Freq: Every day | ORAL | Status: DC
  Administered 2021-09-14: 75 mg via ORAL
  Filled 2021-09-13: qty 1

## 2021-09-13 MED ORDER — FAMOTIDINE 20 MG PO TABS: 20.0000 mg | ORAL_TABLET | Freq: Every day | ORAL | Status: DC

## 2021-09-13 MED ORDER — LIDOCAINE 5 % EX PTCH: 2.0000 | MEDICATED_PATCH | CUTANEOUS | 0 refills | Status: DC

## 2021-09-13 MED ORDER — TRAZODONE HCL 50 MG PO TABS: 50.0000 mg | ORAL_TABLET | Freq: Every evening | ORAL | Status: DC | PRN

## 2021-09-13 MED ORDER — MIRTAZAPINE 45 MG PO TABS: 45.0000 mg | ORAL_TABLET | Freq: Every day | ORAL | Status: DC

## 2021-09-13 MED ORDER — ACETAMINOPHEN 325 MG PO TABS
650.0000 mg | ORAL_TABLET | Freq: Four times a day (QID) | ORAL | Status: DC | PRN
  Administered 2021-09-15: 650 mg via ORAL
  Filled 2021-09-13: qty 2

## 2021-09-13 MED ORDER — MAGNESIUM HYDROXIDE 400 MG/5ML PO SUSP
30.0000 mL | Freq: Every day | ORAL | Status: DC | PRN
  Administered 2021-09-16: 30 mL via ORAL
  Filled 2021-09-13: qty 30

## 2021-09-13 MED ORDER — HYDROXYZINE HCL 25 MG PO TABS
25.0000 mg | ORAL_TABLET | Freq: Three times a day (TID) | ORAL | Status: DC | PRN
  Administered 2021-09-15 – 2021-09-20 (×4): 25 mg via ORAL
  Filled 2021-09-13 (×4): qty 1

## 2021-09-13 MED ORDER — AMLODIPINE BESYLATE 10 MG PO TABS: 10.0000 mg | ORAL_TABLET | Freq: Every day | ORAL | Status: AC

## 2021-09-13 MED ORDER — LIDOCAINE 5 % EX PTCH
2.0000 | MEDICATED_PATCH | CUTANEOUS | Status: DC
  Administered 2021-09-14 – 2021-09-20 (×7): 2 via TRANSDERMAL
  Filled 2021-09-13 (×7): qty 2

## 2021-09-13 MED ORDER — FAMOTIDINE 20 MG PO TABS
20.0000 mg | ORAL_TABLET | Freq: Every day | ORAL | Status: DC
  Administered 2021-09-14 – 2021-09-20 (×7): 20 mg via ORAL
  Filled 2021-09-13 (×7): qty 1

## 2021-09-13 MED ORDER — LITHIUM CARBONATE ER 450 MG PO TBCR
450.0000 mg | EXTENDED_RELEASE_TABLET | Freq: Two times a day (BID) | ORAL | Status: DC
  Administered 2021-09-13 – 2021-09-20 (×14): 450 mg via ORAL
  Filled 2021-09-13 (×15): qty 1

## 2021-09-13 MED ORDER — AMLODIPINE BESYLATE 5 MG PO TABS
10.0000 mg | ORAL_TABLET | Freq: Every day | ORAL | Status: DC
  Administered 2021-09-14 – 2021-09-20 (×7): 10 mg via ORAL
  Filled 2021-09-13 (×7): qty 2

## 2021-09-13 MED ORDER — METHOCARBAMOL 500 MG PO TABS
500.0000 mg | ORAL_TABLET | Freq: Two times a day (BID) | ORAL | Status: DC
  Administered 2021-09-13 – 2021-09-20 (×14): 500 mg via ORAL
  Filled 2021-09-13 (×14): qty 1

## 2021-09-13 MED ORDER — QUETIAPINE FUMARATE 300 MG PO TABS: 300.0000 mg | ORAL_TABLET | Freq: Every day | ORAL | Status: DC

## 2021-09-13 MED ORDER — HYDROXYZINE HCL 25 MG PO TABS: 25.0000 mg | ORAL_TABLET | Freq: Three times a day (TID) | ORAL | 0 refills | Status: DC | PRN

## 2021-09-13 MED ORDER — METHOCARBAMOL 500 MG PO TABS: 500.0000 mg | ORAL_TABLET | Freq: Two times a day (BID) | ORAL | 0 refills | Status: DC

## 2021-09-13 MED ORDER — MAGNESIUM HYDROXIDE 400 MG/5ML PO SUSP: 30.0000 mL | Freq: Every day | ORAL | Status: DC | PRN

## 2021-09-13 MED ORDER — LITHIUM CARBONATE ER 450 MG PO TBCR: 450.0000 mg | EXTENDED_RELEASE_TABLET | Freq: Two times a day (BID) | ORAL | Status: DC

## 2021-09-13 NOTE — BH Assessment (Signed)
Patient is to be admitted to [place] [facility] by Psychiatric Nurse Practitioner Waylan Boga.  Attending Physician will be Dr.  Weber Cooks .   Patient has been assigned to room L28, by [place], Loree Fee.    ER staff is aware of the admission:  Seth Bake, Patient Access.

## 2021-09-13 NOTE — Plan of Care (Signed)
Patient observed in bed in his room awake, alert and verbally responsive. Calm and cooperative during assessment. Patient denies SI, HI, and AVH. Endorsed anxiety of 3/10 and depression of 3/10. Endorsed lower back pain of 3/10, but stated resting make him feel better. Compliant with all due medications. Remain safe on the unit with Q15 minute safety check.  Problem: Education: Goal: Knowledge of [place] [facility] Education information/materials will improve Outcome: Progressing Goal: Emotional status will improve Outcome: Progressing Goal: Mental status will improve Outcome: Progressing Goal: Verbalization of understanding the information provided will improve Outcome: Progressing   Problem: Activity: Goal: Interest or engagement in activities will improve Outcome: Progressing Goal: Sleeping patterns will improve Outcome: Progressing   Problem: Coping: Goal: Ability to verbalize frustrations and anger appropriately will improve Outcome: Progressing Goal: Ability to demonstrate self-control will improve Outcome: Progressing   Problem: Health Behavior/Discharge Planning: Goal: Identification of resources available to assist in meeting health care needs will improve Outcome: Progressing Goal: Compliance with treatment plan for underlying cause of condition will improve Outcome: Progressing   Problem: Physical Regulation: Goal: Ability to maintain clinical measurements within normal limits will improve Outcome: Progressing   Problem: Safety: Goal: Periods of time without injury will increase Outcome: Progressing   Problem: Education: Goal: Utilization of techniques to improve thought processes will improve Outcome: Progressing Goal: Knowledge of the prescribed therapeutic regimen will improve Outcome: Progressing   Problem: Activity: Goal: Interest or engagement in leisure activities will improve Outcome: Progressing Goal: Imbalance in normal sleep/wake cycle will  improve Outcome: Progressing   Problem: Coping: Goal: Coping ability will improve Outcome: Progressing Goal: Will verbalize feelings Outcome: Progressing   Problem: Health Behavior/Discharge Planning: Goal: Ability to make decisions will improve Outcome: Progressing Goal: Compliance with therapeutic regimen will improve Outcome: Progressing   Problem: Role Relationship: Goal: Will demonstrate positive changes in social behaviors and relationships Outcome: Progressing   Problem: Safety: Goal: Ability to disclose and discuss suicidal ideas will improve Outcome: Progressing Goal: Ability to identify and utilize support systems that promote safety will improve Outcome: Progressing   Problem: Self-Concept: Goal: Will verbalize positive feelings about self Outcome: Progressing Goal: Level of anxiety will decrease Outcome: Progressing

## 2021-09-13 NOTE — Tx Team (Signed)
Initial Treatment Plan 09/13/2021 4:56 PM Jaimes Eckert QJS:[id number]    PATIENT STRESSORS: Health problems     PATIENT STRENGTHS: Capable of independent living    PATIENT IDENTIFIED PROBLEMS: Depression and medication management                      DISCHARGE CRITERIA:  Ability to meet basic life and health needs Improved stabilization in mood, thinking, and/or behavior  PRELIMINARY DISCHARGE PLAN: Return to previous living arrangement  PATIENT/FAMILY INVOLVEMENT: This treatment plan has been presented to and reviewed with the patient, Casey Silva. The patient has been given the opportunity to ask questions and make suggestions.  Nolon Bussing, RN 09/13/2021, 4:56 PM

## 2021-09-13 NOTE — Progress Notes (Signed)
Patient admitted voluntary to [facility] from [facility] with diagnosis of worsening depression. Patient presents to unit ambulatory A&Ox4. Patient states, "my depression is worse and I need my medications managed" Patient's affect is sad, sullen, and anxious. Speech is clear and thoughts are organized. Patient endorses depression and anxiety stating his main stressors are his medications.  Patient currently denies suicidal ideations, homicidal ideations, audio or visual hallucinations and verbally contracts for safety on unit.  Reports back pain 4/10. Denies incontinence and reports last BM yesterday. Patient denies smoking, drug abuse, or ETOH use. Patient reports living alone with no support system.   Emotional support and reassurance provided throughout admission intake. Afterwards, oriented patient to unit, room and call light, reviewed POC with all questions answered and understanding verbalzied. Denies any needs at this time.  Will continue to monitor with ongoing Q 15 minute safety checks per unit protocol.

## 2021-09-13 NOTE — ED Notes (Signed)
Patient remains dysphoric with sad anxious affect.  Patient is isolative with poor grooming skills and negative outlook.  Patient is calm, pleasant and cooperative with care.  He denies avh shi or plan.  Will monitor and provide safe environment and support as needed.

## 2021-09-13 NOTE — Discharge Instructions (Addendum)
Transfer to [facility]

## 2021-09-13 NOTE — Progress Notes (Signed)
Patient remains dysphoric, isolative and with minimal energy.  Patient did attend group today and participated well.  Once his PCR results he will be going to [facility] for further treatment.  He is aware of this plan.  Denies avh si or plan.  Patient remains passively suicidal.  Will continue to provide support and safety on unit.

## 2021-09-13 NOTE — ED Provider Notes (Signed)
FBC/OBS ASAP Discharge Summary  Date and Time: 09/13/2021 2:45 PM  Name: Casey Silva  MRN:  [id number]   Discharge Diagnoses:  Final diagnoses:  Severe episode of recurrent major depressive disorder, with psychotic features ([place])  Generalized anxiety disorder    Subjective:  Patient seen and chart reviewed-patient is been compliant with medications and appropriate with staff and peers on the unit.  Patient reported improvement in his back pain with medications and lidocaine patches.  Patient reported sleeping well and denies issues with appetite.  Patient continues to report SI without a plan.  Patient describes his depression as unchanged since his admission to the [facility].  Patient discussed his concerns that "I cannot care for myself.  Shopping, cooking, cleaning.  Daily tasks".  Discussed with patient, that due to his unchanged depressive symptoms despite treatment the [facility] he would be appropriate for higher level of care and that his information will be referred out to other facilities.  Patient verbalized understanding and was in agreement.  Stay Summary:  Casey Silva 66 y.o., male patient presented to [facility] on 09/10/2021 with complaints of chronic SI and increased depression.  He was admitted to the [facility].  Shortly after being admitted patient was complaining of back pain and difficulty urinating.  He was sent to the [facility] emergency department for medical clearance.  He was prescribed Robaxin 500 mg twice daily and lidocaine patches and was transferred back to the [facility] on 6/3. UDSneg.  On admission to the [facility] patient reported noncompliance with recent home medications.  He was restarted on home medications of Norvasc 10 mg, famotidine 20 mg, lithium 450 mg twice daily.  On admission lithium less than 0.06 indicating noncompliance.  He was restarted on lower doses of home medications of Remeron 15 mg (these previously did well on 45 mg) and Seroquel 100 mg (previously did well on 300 mg) and Effexor  75 mg (previously did well on 225 mg).  On 6/5, patient reported that he had been medication compliant with Seroquel and Remeron only as assist with his sleep and these medication doses were increased to previous home doses.  On 6/6, patient continued to report minimal to no improvement in his symptoms and the decision was made to refer patient to a higher level of care.  Patient was accepted by [facility] on 6/6 for continued treatment, safety, stabilization.  Total Time spent with patient: 30 minutes  Past Psychiatric History:  bipolar 1 disorder, MDD with psychotic features, psychosis, GAD Past Medical History:  Past Medical History:  Diagnosis Date   Anxiety    Asthma    Depression    No past surgical history on file. Family History:  Family History  Problem Relation Age of Onset   Bipolar disorder Mother    Family Psychiatric History: mother with bipolar disorder Social History:  Social History   Substance and Sexual Activity  Alcohol Use No     Social History   Substance and Sexual Activity  Drug Use No    Social History   Socioeconomic History   Marital status: Single    Spouse name: Not on file   Number of children: 1   Years of education: Not on file   Highest education level: GED or equivalent  Occupational History   Not on file  Tobacco Use   Smoking status: Never   Smokeless tobacco: Never  Vaping Use   Vaping Use: Unknown  Substance and Sexual Activity   Alcohol use: No  Drug use: No   Sexual activity: Not Currently  Other Topics Concern   Not on file  Social History Narrative   Marital status:  Single   Children: Daughter   Employment:  Unemployment. previous work for [employer].   Alcohol:  None   Drugs:  none   Exercise:  Regularly very   Religion:  Jewish   Social Determinants of Radio broadcast assistant Strain: Not on file  Food Insecurity: Not on file  Transportation Needs: Not on file  Physical Activity: Not on file  Stress: Not on  file  Social Connections: Not on file   SDOH:  SDOH Screenings   Alcohol Screen: Low Risk    Last Alcohol Screening Score (AUDIT): 0  Depression (PHQ2-9): Medium Risk   PHQ-2 Score: 26  Financial Resource Strain: Not on file  Food Insecurity: Not on file  Housing: Not on file  Physical Activity: Not on file  Social Connections: Not on file  Stress: Not on file  Tobacco Use: Low Risk    Smoking Tobacco Use: Never   Smokeless Tobacco Use: Never   Passive Exposure: Not on file  Transportation Needs: Not on file    Tobacco Cessation:  Prescription not provided because: N/A  Current Medications:  Current Facility-Administered Medications  Medication Dose Route Frequency Provider Last Rate Last Admin   acetaminophen (TYLENOL) tablet 650 mg  650 mg Oral Q6H PRN Nwoko, Uchenna E, PA       alum & mag hydroxide-simeth (MAALOX/MYLANTA) 200-200-20 MG/5ML suspension 30 mL  30 mL Oral Q4H PRN Nwoko, Uchenna E, PA       amLODipine (NORVASC) tablet 10 mg  10 mg Oral Daily Ival Bible, MD   10 mg at 09/13/21 1026   famotidine (PEPCID) tablet 20 mg  20 mg Oral Daily Ival Bible, MD   20 mg at 09/13/21 1026   hydrOXYzine (ATARAX) tablet 25 mg  25 mg Oral TID PRN Nwoko, Uchenna E, PA   25 mg at 09/11/21 0043   lidocaine (LIDODERM) 5 % 2 patch  2 patch Transdermal Q24H Ival Bible, MD   2 patch at 09/13/21 1239   lithium carbonate (ESKALITH) CR tablet 450 mg  450 mg Oral Q12H Revonda Humphrey, NP   450 mg at 09/13/21 1026   magnesium hydroxide (MILK OF MAGNESIA) suspension 30 mL  30 mL Oral Daily PRN Nwoko, Uchenna E, PA       methocarbamol (ROBAXIN) tablet 500 mg  500 mg Oral BID Thomes Lolling H, NP   500 mg at 09/13/21 1026   mirtazapine (REMERON) tablet 45 mg  45 mg Oral QHS Ival Bible, MD   45 mg at 09/12/21 2118   QUEtiapine (SEROQUEL) tablet 300 mg  300 mg Oral QHS Ival Bible, MD   300 mg at 09/12/21 2118   traZODone (DESYREL) tablet 50 mg   50 mg Oral QHS PRN Nwoko, Uchenna E, PA   50 mg at 09/12/21 2118   venlafaxine XR (EFFEXOR-XR) 24 hr capsule 75 mg  75 mg Oral Q breakfast Ival Bible, MD   75 mg at 09/13/21 1026   Current Outpatient Medications  Medication Sig Dispense Refill   [START ON 09/14/2021] amLODipine (NORVASC) 10 MG tablet Take 1 tablet (10 mg total) by mouth daily.     [START ON 09/14/2021] famotidine (PEPCID) 20 MG tablet Take 1 tablet (20 mg total) by mouth daily.     hydrOXYzine (ATARAX)  25 MG tablet Take 1 tablet (25 mg total) by mouth 3 (three) times daily as needed for anxiety. 30 tablet 0   lidocaine (LIDODERM) 5 % Place 2 patches onto the skin daily. Remove & Discard patch within 12 hours or as directed by MD 30 patch 0   lithium carbonate (ESKALITH) 450 MG CR tablet Take 1 tablet (450 mg total) by mouth every 12 (twelve) hours.     methocarbamol (ROBAXIN) 500 MG tablet Take 1 tablet (500 mg total) by mouth 2 (two) times daily for 5 days. 10 tablet 0   mirtazapine (REMERON) 45 MG tablet Take 1 tablet (45 mg total) by mouth at bedtime.     QUEtiapine (SEROQUEL) 300 MG tablet Take 1 tablet (300 mg total) by mouth at bedtime.     traZODone (DESYREL) 50 MG tablet Take 1 tablet (50 mg total) by mouth at bedtime as needed for sleep.     [START ON 09/14/2021] venlafaxine XR (EFFEXOR-XR) 75 MG 24 hr capsule Take 1 capsule (75 mg total) by mouth daily with breakfast.      PTA Medications: (Not in a hospital admission)      09/11/2021    6:41 PM 06/07/2021    4:31 AM 03/30/2021    2:06 PM  Depression screen PHQ 2/9  Decreased Interest '3 3 3  '$ Down, Depressed, Hopeless '3 3 2  '$ PHQ - 2 Score '6 6 5  '$ Altered sleeping '3 2 3  '$ Tired, decreased energy '3 2 3  '$ Change in appetite '3 2 1  '$ Feeling bad or failure about yourself  '3 2 3  '$ Trouble concentrating '3 1 2  '$ Moving slowly or fidgety/restless '2 1 1  '$ Suicidal thoughts '3 3 2  '$ PHQ-9 Score '26 19 20  '$ Difficult doing work/chores Extremely dIfficult Extremely dIfficult  Very difficult    Flowsheet Row ED from 09/10/2021 in [facility] Most recent reading at 09/11/2021 12:32 AM ED from 09/10/2021 in [place] Most recent reading at 09/10/2021  5:49 PM ED from 09/10/2021 in [facility] Most recent reading at 09/10/2021  4:28 PM  C-SSRS RISK CATEGORY Low Risk No Risk High Risk       Musculoskeletal  Strength & Muscle Tone: within normal limits Gait & Station: normal Patient leans: N/A  Psychiatric Specialty Exam  Presentation  General Appearance: Appropriate for Environment; Disheveled (hospital scrubs)  Eye Contact:Minimal  Speech:Clear and Coherent; Normal Rate  Speech Volume:Normal  Handedness:Right   Mood and Affect  Mood:Depressed  Affect:Flat; Depressed; Congruent   Thought Process  Thought Processes:Coherent; Goal Directed; Linear  Descriptions of Associations:Intact  Orientation:Full (Time, Place and Person)  Thought Content:WDL; Logical  Diagnosis of Schizophrenia or Schizoaffective disorder in past: No  Duration of Psychotic Symptoms: Less than six months   Hallucinations:Hallucinations: None  Ideas of Reference:None  Suicidal Thoughts:Suicidal Thoughts: Yes, Passive SI Passive Intent and/or Plan: Without Intent; Without Plan  Homicidal Thoughts:Homicidal Thoughts: No   Sensorium  Memory:Immediate Fair; Recent Fair; Remote Fair  Judgment:Fair  Insight:Fair   Executive Functions  Concentration:Fair  Attention Span:Fair  [place]   Psychomotor Activity  Psychomotor Activity:Psychomotor Activity: Decreased; Psychomotor Retardation   Assets  Assets:Communication Skills; Desire for Improvement; Resilience   Sleep  Sleep:Sleep: Fair   No data recorded  Physical Exam  Physical Exam Constitutional:      Appearance: Normal appearance. He is normal weight.  HENT:  Head: Normocephalic and atraumatic.  Eyes:     Extraocular Movements: Extraocular movements intact.  Pulmonary:     Effort: Pulmonary effort is normal.  Neurological:     General: No focal deficit present.     Mental Status: He is alert and oriented to person, place, and time.  Psychiatric:        Attention and Perception: Attention and perception normal.        Speech: Speech normal.        Behavior: Behavior normal. Behavior is cooperative.        Thought Content: Thought content normal.   Review of Systems  Constitutional:  Negative for chills and fever.  HENT:  Negative for hearing loss.   Eyes:  Negative for discharge and redness.  Respiratory:  Negative for cough.   Cardiovascular:  Negative for chest pain.  Gastrointestinal:  Negative for abdominal pain.  Musculoskeletal:  Negative for myalgias.  Neurological:  Negative for headaches.  Psychiatric/Behavioral:  Positive for depression and suicidal ideas. Negative for hallucinations and substance abuse. The patient is not nervous/anxious.   Blood pressure (!) 120/93, pulse 82, temperature 98.2 F (36.8 C), resp. rate 18, SpO2 97 %. There is no height or weight on file to calculate BMI.  Demographic Factors:  Male, Caucasian, Low socioeconomic status, Living alone, and Unemployed  Loss Factors: Decrease in vocational status, Decline in physical health, and Financial problems/change in socioeconomic status  Historical Factors: Prior suicide attempts, Family history of mental illness or substance abuse, and Impulsivity  Risk Reduction Factors:   NA  Continued Clinical Symptoms:  Severe Anxiety and/or Agitation Depression:   Hopelessness Severe More than one psychiatric diagnosis Previous Psychiatric Diagnoses and Treatments Medical Diagnoses and Treatments/Surgeries  Cognitive Features That Contribute To Risk:  Thought constriction (tunnel vision)    Suicide Risk:  Moderate:  Frequent suicidal ideation with  limited intensity, and duration, some specificity in terms of plans, no associated intent, good self-control, limited dysphoria/symptomatology, some risk factors present, and identifiable protective factors, including available and accessible social support.  Plan Of Care/Follow-up recommendations:  Transfer to [facility] for continued treatment, safety and stabilization  Disposition: Patient in need of higher level of care- patient admitted to the [facility] with no/minimal improvement over 3 days-patient accepted to [facility] for inpatient treatment  Ival Bible, MD 09/13/2021, 2:45 PM

## 2021-09-13 NOTE — Plan of Care (Signed)
  Problem: Education: Goal: Knowledge of [facility] Education information/materials will improve Outcome: Progressing Goal: Emotional status will improve Outcome: Progressing Goal: Mental status will improve Outcome: Progressing Goal: Verbalization of understanding the information provided will improve Outcome: Progressing   Problem: Activity: Goal: Interest or engagement in activities will improve Outcome: Progressing Goal: Sleeping patterns will improve Outcome: Progressing   Problem: Coping: Goal: Ability to verbalize frustrations and anger appropriately will improve Outcome: Progressing Goal: Ability to demonstrate self-control will improve Outcome: Progressing   Problem: Health Behavior/Discharge Planning: Goal: Identification of resources available to assist in meeting health care needs will improve Outcome: Progressing Goal: Compliance with treatment plan for underlying cause of condition will improve Outcome: Progressing   Problem: Physical Regulation: Goal: Ability to maintain clinical measurements within normal limits will improve Outcome: Progressing   Problem: Safety: Goal: Periods of time without injury will increase Outcome: Progressing   Problem: Education: Goal: Utilization of techniques to improve thought processes will improve Outcome: Progressing Goal: Knowledge of the prescribed therapeutic regimen will improve Outcome: Progressing   Problem: Activity: Goal: Interest or engagement in leisure activities will improve Outcome: Progressing Goal: Imbalance in normal sleep/wake cycle will improve Outcome: Progressing   Problem: Coping: Goal: Coping ability will improve Outcome: Progressing Goal: Will verbalize feelings Outcome: Progressing   Problem: Health Behavior/Discharge Planning: Goal: Ability to make decisions will improve Outcome: Progressing Goal: Compliance with therapeutic regimen will improve Outcome: Progressing    Problem: Role Relationship: Goal: Will demonstrate positive changes in social behaviors and relationships Outcome: Progressing   Problem: Safety: Goal: Ability to disclose and discuss suicidal ideas will improve Outcome: Progressing Goal: Ability to identify and utilize support systems that promote safety will improve Outcome: Progressing   Problem: Self-Concept: Goal: Will verbalize positive feelings about self Outcome: Progressing Goal: Level of anxiety will decrease Outcome: Progressing

## 2021-09-13 NOTE — ED Notes (Signed)
Snacks given 

## 2021-09-13 NOTE — Clinical Social Work Psych Note (Signed)
LCSW Update  LCSW and Dr. Serafina Mitchell, MD spoke with Casey Silva to determine his presentation for today. Casey Silva continues to present with a depressed affect, congruent mood.   Casey Silva does report minimal progression, however stated that he continues to be passively suicidal. Casey Silva reports " I still wish that I was gone". Casey Silva shared that he is concerned about his discharge due to "not being able to care for myself". Casey Silva reports that he struggles completing daily ADL's including, grooming, cooking, cleaning and even managing his medication regiment.   LCSW and Dr. Serafina Mitchell discussed the patient's presentation and overall progression. Per Dr. Serafina Mitchell, MD the patient would benefit from a higher level of care. Dr. Serafina Mitchell requested for LCSW to refer the patient for possible geriatric inpatient psychiatric treatment.   LCSW referred the patient to [facility] unit for review. Per Waylan Boga, NP, the patient has been accepted for gero-psych services.   Casey Silva was accepted to:  [facility] BMU/Gero-Psych Unit - Room L28 - Accepting RN is Maxie Better - Nurse to Nurse report # [phone] Fax Number: [phone] (Voluntary consent and PCR Covid results faxed)  Dr. Serafina Mitchell, MD notified Shanna Cisco, RN notified.   LCSW signing off.      Radonna Ricker, MSW, LCSW Clinical Education officer, museum ([place]) [facility]

## 2021-09-13 NOTE — ED Notes (Signed)
Discharge instructions provided and Pt stated understanding. Pt alert, orient and ambulatory prior to d/c from facility. Personal belongings returned from locker number 25. Safe transport called for transportation services. Pt escorted to the sally port. Report called previously by other staff nurse to [facility] RN. EMTALA and sticker given to driver at time of Pt's d/c from facility. Safety maintained.

## 2021-09-13 NOTE — Group Note (Signed)
Group Topic: Recovery Basics  Group Date: 09/13/2021 Start Time: [place] End Time: 1245 Facilitators: Laury Axon E  Department: [facility]  Number of Participants: 2  Group Focus: activities of daily living skills, feeling awareness/expression, and personal responsibility Treatment Modality:  Behavior Modification Therapy Interventions utilized were patient education and support Purpose: enhance coping skills  Name: Casey Silva Date of Birth: 26-Nov-1955  MR: [id number]    Level of Participation: active Quality of Participation: attentive and cooperative Interactions with others: gave feedback Mood/Affect: appropriate Triggers (if applicable): n/a Cognition: coherent/clear Progress: Moderate Response: n/a Plan: follow-up needed  Patients Problems:  Patient Active Problem List   Diagnosis Date Noted   MDD (major depressive disorder), recurrent episode, moderate ([place]) 09/10/2021   Essential hypertension 06/24/2021   Severe recurrent major depression with psychotic features ([place]) 06/23/2021   Vitamin D deficiency 06/16/2021   MDD (major depressive disorder), recurrent, severe, with psychosis ([place]) 06/08/2021   GAD (generalized anxiety disorder) 11/25/2020   Bipolar 1 disorder, depressed, severe ([place]) 11/23/2020   Suicide ideation 11/22/2020   Severe major depression with psychotic features ([place]) 11/22/2020   Fatigue 11/01/2020   History of 2019 novel coronavirus disease (COVID-19) 10/15/2020   Candidiasis of mouth 10/15/2020   Primary insomnia 10/20/2019   Psychosis ([place]) 09/24/2019   Marijuana user 09/23/2019   Benign essential hypertension 02/21/2016   Family history of diabetes mellitus 02/21/2016   Seasonal allergies 05/28/2015   Muscle spasms of neck 05/28/2015   Asthma 05/28/2015   Lipoma of skin 05/28/2015   Neck pain 05/28/2015   Postconcussion syndrome 05/28/2015   Pulmonary emphysema ([place]) 05/28/2015

## 2021-09-14 DIAGNOSIS — F332 Major depressive disorder, recurrent severe without psychotic features: Secondary | ICD-10-CM

## 2021-09-14 MED ORDER — VENLAFAXINE HCL ER 75 MG PO CP24
150.0000 mg | ORAL_CAPSULE | Freq: Every day | ORAL | Status: DC
Start: 2021-09-15 — End: 2021-09-20
  Administered 2021-09-15 – 2021-09-20 (×6): 150 mg via ORAL
  Filled 2021-09-14 (×6): qty 2

## 2021-09-14 NOTE — H&P (Addendum)
Psychiatric Admission Assessment Adult  Patient Identification: Casey Silva MRN:  [id number] Date of Evaluation:  09/14/2021 Chief Complaint:  Severe recurrent major depression with psychotic features ([place]) [F33.3] Principal Diagnosis: Major depressive disorder, recurrent severe without psychotic features ([place]) Diagnosis:  Principal Problem:   Major depressive disorder, recurrent severe without psychotic features ([place])  History of Present Illness: 66 yo male with a long history of depression, admitted for increase in suicidal ideations.  His baseline is chronic suicidal ideations.  He was recently released after a long stay inpatient with ECT.  Returned to the ED reporting he did not take his medications for the past 5 days as he was confused from ECT.  The plan is to get his medications back to his baseline, seek an ACT team, and return to his previous living environment.  He has been 3 days at the [facility] and reports his depression is still high with chronic suicidal ideations, no changes or plans.  His anxiety has improved, "little better, mild", no panic attacks.  His sleep is "fair", appetite is "good".  Denies trauma, hallucinations, substance abuse, and homicidal ideations.  Discussed the plan of care with him of restarting and adjusting his medications with return home goal of early next week with the hope to have an ACT team in place to assist with his care in the community since he has had multiple admissions and could greatly benefit from having one.  Associated Signs/Symptoms: Depression Symptoms:  depressed mood, anhedonia, fatigue, difficulty concentrating, suicidal thoughts without plan, Duration of Depression Symptoms: Greater than two weeks  (Hypo) Manic Symptoms:   none Anxiety Symptoms:  mild Psychotic Symptoms:   none PTSD Symptoms: NA Total Time spent with patient: One hour  Past Psychiatric History: depression, anxiety  Is the patient at risk to self? Yes.    Has the  patient been a risk to self in the past 6 months? Yes.    Has the patient been a risk to self within the distant past? Yes.    Is the patient a risk to others? No.  Has the patient been a risk to others in the past 6 months? No.  Has the patient been a risk to others within the distant past? No.   Prior Inpatient Therapy:  multiple inpatient admissions Prior Outpatient Therapy:  yes  Alcohol Screening: 1. How often do you have a drink containing alcohol?: Never 2. How many drinks containing alcohol do you have on a typical day when you are drinking?: 1 or 2 3. How often do you have six or more drinks on one occasion?: Never AUDIT-C Score: 0 4. How often during the last year have you found that you were not able to stop drinking once you had started?: Never 5. How often during the last year have you failed to do what was normally expected from you because of drinking?: Never 6. How often during the last year have you needed a first drink in the morning to get yourself going after a heavy drinking session?: Never 7. How often during the last year have you had a feeling of guilt of remorse after drinking?: Never 8. How often during the last year have you been unable to remember what happened the night before because you had been drinking?: Never 9. Have you or someone else been injured as a result of your drinking?: No 10. Has a relative or friend or a doctor or another health worker been concerned about your drinking or suggested you cut  down?: No Alcohol Use Disorder Identification Test Final Score (AUDIT): 0 Substance Abuse History in the last 12 months:  No. Consequences of Substance Abuse: NA Previous Psychotropic Medications: Yes  Psychological Evaluations: Yes  Past Medical History:  Past Medical History:  Diagnosis Date   Anxiety    Asthma    Depression    History reviewed. No pertinent surgical history. Family History:  Family History  Problem Relation Age of Onset   Bipolar  disorder Mother    Family Psychiatric  History: see above Tobacco Screening:   Social History:  Social History   Substance and Sexual Activity  Alcohol Use No     Social History   Substance and Sexual Activity  Drug Use No    Additional Social History:  lives alone     Allergies:   Allergies  Allergen Reactions   Dust Mite Mixed Allergen Ext [Mite (D. Farinae)] Shortness Of Breath   Bee Pollen Other (See Comments)    Watery Eyes   Lab Results:  Results for orders placed or performed during the hospital encounter of 09/10/21 (from the past 48 hour(s))  Comprehensive metabolic panel     Status: Abnormal   Collection Time: 09/12/21  3:14 PM  Result Value Ref Range   Sodium 139 135 - 145 mmol/L   Potassium 4.5 3.5 - 5.1 mmol/L   Chloride 104 98 - 111 mmol/L   CO2 24 22 - 32 mmol/L   Glucose, Bld 104 (H) 70 - 99 mg/dL    Comment: Glucose reference range applies only to samples taken after fasting for at least 8 hours.   BUN 14 8 - 23 mg/dL   Creatinine, Ser 1.12 0.61 - 1.24 mg/dL   Calcium 9.3 8.9 - 10.3 mg/dL   Total Protein 6.3 (L) 6.5 - 8.1 g/dL   Albumin 3.6 3.5 - 5.0 g/dL   AST 16 15 - 41 U/L   ALT 12 0 - 44 U/L   Alkaline Phosphatase 55 38 - 126 U/L   Total Bilirubin 0.6 0.3 - 1.2 mg/dL   GFR, Estimated >60 >60 mL/min    Comment: (NOTE) Calculated using the CKD-EPI Creatinine Equation (2021)    Anion gap 11 5 - 15    Comment: Performed at [facility] [address], [place], [place] [address]  Resp Panel by RT-PCR (Flu A&B, Covid) Anterior Nasal Swab     Status: None   Collection Time: 09/13/21 12:40 PM   Specimen: Anterior Nasal Swab  Result Value Ref Range   SARS Coronavirus 2 by RT PCR NEGATIVE NEGATIVE    Comment: (NOTE) SARS-CoV-2 target nucleic acids are NOT DETECTED.  The SARS-CoV-2 RNA is generally detectable in upper respiratory specimens during the acute phase of infection. The lowest concentration of SARS-CoV-2 viral copies this assay  can detect is 138 copies/mL. A negative result does not preclude SARS-Cov-2 infection and should not be used as the sole basis for treatment or other patient management decisions. A negative result may occur with  improper specimen collection/handling, submission of specimen other than nasopharyngeal swab, presence of viral mutation(s) within the areas targeted by this assay, and inadequate number of viral copies(<138 copies/mL). A negative result must be combined with clinical observations, patient history, and epidemiological information. The expected result is Negative.  Fact Sheet for Patients:  EntrepreneurPulse.com.au  Fact Sheet for Healthcare Providers:  IncredibleEmployment.be  This test is no t yet approved or cleared by the [place] FDA and  has been  authorized for detection and/or diagnosis of SARS-CoV-2 by FDA under an Emergency Use Authorization (EUA). This EUA will remain  in effect (meaning this test can be used) for the duration of the COVID-19 declaration under Section 564(b)(1) of the Act, 21 U.S.C.section 360bbb-3(b)(1), unless the authorization is terminated  or revoked sooner.       Influenza A by PCR NEGATIVE NEGATIVE   Influenza B by PCR NEGATIVE NEGATIVE    Comment: (NOTE) The Xpert Xpress SARS-CoV-2/FLU/RSV plus assay is intended as an aid in the diagnosis of influenza from Nasopharyngeal swab specimens and should not be used as a sole basis for treatment. Nasal washings and aspirates are unacceptable for Xpert Xpress SARS-CoV-2/FLU/RSV testing.  Fact Sheet for Patients: EntrepreneurPulse.com.au  Fact Sheet for Healthcare Providers: IncredibleEmployment.be  This test is not yet approved or cleared by the [place] FDA and has been authorized for detection and/or diagnosis of SARS-CoV-2 by FDA under an Emergency Use Authorization (EUA). This EUA will remain in effect  (meaning this test can be used) for the duration of the COVID-19 declaration under Section 564(b)(1) of the Act, 21 U.S.C. section 360bbb-3(b)(1), unless the authorization is terminated or revoked.  Performed at [facility], [place] [address], [place], [place] [address]     Blood Alcohol level:  Lab Results  Component Value Date   [facility] <10 06/07/2021   ETH <10 36/64/4034    Metabolic Disorder Labs:  Lab Results  Component Value Date   HGBA1C 5.1 06/07/2021   MPG 99.67 06/07/2021   MPG 111.15 11/24/2020   No results found for: PROLACTIN Lab Results  Component Value Date   CHOL 197 06/07/2021   TRIG 149 06/07/2021   HDL 44 06/07/2021   CHOLHDL 4.5 06/07/2021   VLDL 30 06/07/2021   LDLCALC 123 (H) 06/07/2021   LDLCALC 148 (H) 11/24/2020    Current Medications: Current Facility-Administered Medications  Medication Dose Route Frequency Provider Last Rate Last Admin   acetaminophen (TYLENOL) tablet 650 mg  650 mg Oral Q6H PRN Clapacs, [name], MD       alum & mag hydroxide-simeth (MAALOX/MYLANTA) 200-200-20 MG/5ML suspension 30 mL  30 mL Oral Q4H PRN Clapacs, [name], MD       amLODipine (NORVASC) tablet 10 mg  10 mg Oral Daily Clapacs, [name], MD       famotidine (PEPCID) tablet 20 mg  20 mg Oral Daily Clapacs, [name], MD       hydrOXYzine (ATARAX) tablet 25 mg  25 mg Oral TID PRN Clapacs, [name], MD       lidocaine (LIDODERM) 5 % 2 patch  2 patch Transdermal Q24H Clapacs, [name], MD       lithium carbonate (ESKALITH) CR tablet 450 mg  450 mg Oral Q12H Clapacs, [name], MD   450 mg at 09/13/21 2128   magnesium hydroxide (MILK OF MAGNESIA) suspension 30 mL  30 mL Oral Daily PRN Clapacs, [name], MD       methocarbamol (ROBAXIN) tablet 500 mg  500 mg Oral BID Clapacs, [name], MD   500 mg at 09/13/21 2128   mirtazapine (REMERON) tablet 45 mg  45 mg Oral QHS Clapacs, [name], MD   45 mg at 09/13/21 2129   QUEtiapine (SEROQUEL) tablet 300 mg  300 mg Oral QHS Clapacs, [name], MD    300 mg at 09/13/21 2128   traZODone (DESYREL) tablet 50 mg  50 mg Oral QHS PRN Clapacs, [name],  MD       venlafaxine XR (EFFEXOR-XR) 24 hr capsule 75 mg  75 mg Oral Q breakfast Clapacs, [name], MD       PTA Medications: Medications Prior to Admission  Medication Sig Dispense Refill Last Dose   amLODipine (NORVASC) 10 MG tablet Take 1 tablet (10 mg total) by mouth daily.      famotidine (PEPCID) 20 MG tablet Take 1 tablet (20 mg total) by mouth daily.      hydrOXYzine (ATARAX) 25 MG tablet Take 1 tablet (25 mg total) by mouth 3 (three) times daily as needed for anxiety. 30 tablet 0    lidocaine (LIDODERM) 5 % Place 2 patches onto the skin daily. Remove & Discard patch within 12 hours or as directed by MD 30 patch 0    lithium carbonate (ESKALITH) 450 MG CR tablet Take 1 tablet (450 mg total) by mouth every 12 (twelve) hours.      methocarbamol (ROBAXIN) 500 MG tablet Take 1 tablet (500 mg total) by mouth 2 (two) times daily for 5 days. 10 tablet 0    mirtazapine (REMERON) 45 MG tablet Take 1 tablet (45 mg total) by mouth at bedtime.      QUEtiapine (SEROQUEL) 300 MG tablet Take 1 tablet (300 mg total) by mouth at bedtime.      traZODone (DESYREL) 50 MG tablet Take 1 tablet (50 mg total) by mouth at bedtime as needed for sleep.      venlafaxine XR (EFFEXOR-XR) 75 MG 24 hr capsule Take 1 capsule (75 mg total) by mouth daily with breakfast.       Musculoskeletal: Strength & Muscle Tone: within normal limits Gait & Station: normal Patient leans: N/A   Psychiatric Specialty Exam: Physical Exam Constitutional:      Appearance: Normal appearance.  HENT:     Head: Normocephalic.     Nose: Nose normal.  Pulmonary:     Effort: Pulmonary effort is normal.  Musculoskeletal:     Cervical back: Normal range of motion.  Neurological:     General: No focal deficit present.     Mental Status: He is alert and oriented to person, place, and time.  Psychiatric:        Attention and Perception:  Attention and perception normal.        Mood and Affect: Mood is anxious and depressed.        Speech: Speech normal.        Behavior: Behavior normal. Behavior is cooperative.        Thought Content: Thought content includes suicidal ideation. Thought content does not include suicidal plan.        Cognition and Memory: Cognition and memory normal.        Judgment: Judgment normal.    Review of Systems  Psychiatric/Behavioral:  Positive for depression and suicidal ideas. The patient is nervous/anxious.   All other systems reviewed and are negative.  Blood pressure (!) 119/94, pulse 92, temperature 98.2 F (36.8 C), resp. rate 20, height '5\' 8"'$  (1.727 m), weight 84.4 kg, SpO2 98 %.Body mass index is 28.28 kg/m.  General Appearance: Casual  Eye Contact:  Fair  Speech:  Clear and Coherent  Volume:  Normal  Mood:  Anxious and Depressed  Affect:  Congruent  Thought Process:  Coherent and Descriptions of Associations: Intact  Orientation:  Full (Time, Place, and Person)  Thought Content:  Rumination  Suicidal Thoughts:  Yes.  without intent/plan  Homicidal Thoughts:  No  Memory:  Immediate;   Fair Recent;   Fair Remote;   Fair  Judgement:  Fair  Insight:  Fair  Psychomotor Activity:  Decreased  Concentration:  Concentration: Fair and Attention Span: Fair  Recall:  [employer] of Knowledge:  Good  Language:  Good  Akathisia:  No  Handed:  Right  AIMS (if indicated):     Assets:  Housing Leisure Time Physical Health Resilience  ADL's:  Intact  Cognition:  WNL  Sleep:          Physical Exam: Physical Exam Constitutional:      Appearance: Normal appearance.  HENT:     Head: Normocephalic.     Nose: Nose normal.  Pulmonary:     Effort: Pulmonary effort is normal.  Musculoskeletal:     Cervical back: Normal range of motion.  Neurological:     General: No focal deficit present.     Mental Status: He is alert and oriented to person, place, and time.  Psychiatric:         Attention and Perception: Attention and perception normal.        Mood and Affect: Mood is anxious and depressed.        Speech: Speech normal.        Behavior: Behavior normal. Behavior is cooperative.        Thought Content: Thought content includes suicidal ideation. Thought content does not include suicidal plan.        Cognition and Memory: Cognition and memory normal.        Judgment: Judgment normal.   Review of Systems  Psychiatric/Behavioral:  Positive for depression and suicidal ideas. The patient is nervous/anxious.   All other systems reviewed and are negative. Blood pressure (!) 119/94, pulse 92, temperature 98.2 F (36.8 C), resp. rate 20, height '5\' 8"'$  (1.727 m), weight 84.4 kg, SpO2 98 %. Body mass index is 28.28 kg/m.  Treatment Plan Summary: Daily contact with patient to assess and evaluate symptoms and progress in treatment, Medication management, and Plan : Major depressive disorder, recurrent, severe without psychosis: Continue Lithium 450 mg BID Increase Effexor 75 mg daily to 150 mg daily  Insomnia: Continue Trazodone 50 mg daily at bedtime PRN Continue Remeron 45 mg daily at bedtime  Anxiety: Continue hydroxyzine 25 mg TID PRN  Observation Level/Precautions:  15 minute checks  Laboratory:  Completed in the ED, WDL  Psychotherapy:  individual and group therapy  Medications:  see MAR  Consultations:  NOne  Discharge Concerns:  None  Estimated LOS:  5-7 days  Other:     Physician Treatment Plan for Primary Diagnosis: Major depressive disorder, recurrent severe without psychotic features ([place]) Long Term Goal(s): Improvement in symptoms so as ready for discharge  Short Term Goals: Ability to identify changes in lifestyle to reduce recurrence of condition will improve, Ability to verbalize feelings will improve, Ability to disclose and discuss suicidal ideas, Ability to demonstrate self-control will improve, and Ability to identify and develop effective  coping behaviors will improve  Physician Treatment Plan for Secondary Diagnosis: Principal Problem:   Major depressive disorder, recurrent severe without psychotic features ([place])  Long Term Goal(s): Improvement in symptoms so as ready for discharge  Short Term Goals: Ability to identify changes in lifestyle to reduce recurrence of condition will improve, Ability to verbalize feelings will improve, Ability to disclose and discuss suicidal ideas, Ability to demonstrate self-control will improve, Ability to identify and develop effective coping behaviors will improve, and Ability to  maintain clinical measurements within normal limits will improve  I certify that inpatient services furnished can reasonably be expected to improve the patient's condition.    Waylan Boga, NP 6/7/20237:50 AM

## 2021-09-14 NOTE — Progress Notes (Signed)
Recreation Therapy Notes   Date: 09/14/2021   Time: 1:30 pm    Location: Craft room      Behavioral response: N/A   Intervention Topic: Self-care   Discussion/Intervention: Patient refused to attend group.    Clinical Observations/Feedback:  Patient refused to attend group.    Corbitt Cloke LRT/CTRS       Brytney Somes 09/14/2021 2:57 PM

## 2021-09-14 NOTE — BHH Suicide Risk Assessment (Signed)
[facility] Admission Suicide Risk Assessment   Nursing information obtained from:  Patient Demographic factors:  Male, Caucasian, Living alone Current Mental Status:  Divorced Loss Factors:  None Historical Factors:  Lives alone  Risk Reduction Factors:  Low support system  Total Time spent with patient: 45 minutes Principal Problem: Major depressive disorder, recurrent severe without psychotic features ([place]) Diagnosis:  Principal Problem:   Major depressive disorder, recurrent severe without psychotic features ([place])  Subjective Data: "I'm not feeling very well, dizzy," started yesterday afternoon.  High depression with chronic suicidal ideations, anxiety has improved to mild.  Continued Clinical Symptoms:  Alcohol Use Disorder Identification Test Final Score (AUDIT): 0 The "Alcohol Use Disorders Identification Test", Guidelines for Use in Primary Care, Second Edition.  World Pharmacologist [facility]). Score between 0-7:  no or low risk or alcohol related problems. Score between 8-15:  moderate risk of alcohol related problems. Score between 16-19:  high risk of alcohol related problems. Score 20 or above:  warrants further diagnostic evaluation for alcohol dependence and treatment.   CLINICAL FACTORS:   Depression:   Anhedonia, severe   Musculoskeletal: Strength & Muscle Tone: within normal limits Gait & Station: normal Patient leans: N/A  Psychiatric Specialty Exam: Physical Exam Vitals and nursing note reviewed.  Constitutional:      Appearance: Normal appearance.  HENT:     Head: Normocephalic.     Nose: Nose normal.  Pulmonary:     Effort: Pulmonary effort is normal.  Musculoskeletal:        General: Normal range of motion.     Cervical back: Normal range of motion.  Neurological:     General: No focal deficit present.     Mental Status: He is alert and oriented to person, place, and time.  Psychiatric:        Attention and Perception: Attention and perception  normal.        Mood and Affect: Mood is anxious and depressed.        Speech: Speech normal.        Behavior: Behavior normal. Behavior is cooperative.        Thought Content: Thought content includes suicidal ideation.        Cognition and Memory: Cognition and memory normal.        Judgment: Judgment normal.    Review of Systems  Constitutional:  Positive for malaise/fatigue.  Neurological:  Positive for dizziness.  Psychiatric/Behavioral:  Positive for depression and suicidal ideas. The patient is nervous/anxious.   All other systems reviewed and are negative.  Blood pressure (!) 119/94, pulse 92, temperature 98.2 F (36.8 C), resp. rate 20, height '5\' 8"'$  (1.727 m), weight 84.4 kg, SpO2 98 %.Body mass index is 28.28 kg/m.  General Appearance: Casual  Eye Contact:  Good  Speech:  Normal Rate  Volume:  Normal  Mood:  Anxious and Depressed  Affect:  Congruent  Thought Process:  Coherent and Descriptions of Associations: Intact  Orientation:  Full (Time, Place, and Person)  Thought Content:  Rumination  Suicidal Thoughts:  Yes.  without intent/plan  Homicidal Thoughts:  No  Memory:  Immediate;   Fair Recent;   Fair Remote;   Fair  Judgement:  Fair  Insight:  Fair  Psychomotor Activity:  Decreased  Concentration:  Concentration: Fair and Attention Span: Fair  Recall:  [employer] of Knowledge:  Good  Language:  Good  Akathisia:  No  Handed:  Right  AIMS (if indicated):  Assets:  Housing Leisure Time Physical Health Resilience  ADL's:  Intact  Cognition:  WNL  Sleep:   Fair      Physical Exam: Physical Exam Vitals and nursing note reviewed.  Constitutional:      Appearance: Normal appearance.  HENT:     Head: Normocephalic.     Nose: Nose normal.  Pulmonary:     Effort: Pulmonary effort is normal.  Musculoskeletal:        General: Normal range of motion.     Cervical back: Normal range of motion.  Neurological:     General: No focal deficit present.      Mental Status: He is alert and oriented to person, place, and time.  Psychiatric:        Attention and Perception: Attention and perception normal.        Mood and Affect: Mood is anxious and depressed.        Speech: Speech normal.        Behavior: Behavior normal. Behavior is cooperative.        Thought Content: Thought content includes suicidal ideation.        Cognition and Memory: Cognition and memory normal.        Judgment: Judgment normal.   Review of Systems  Constitutional:  Positive for malaise/fatigue.  Neurological:  Positive for dizziness.  Psychiatric/Behavioral:  Positive for depression and suicidal ideas. The patient is nervous/anxious.   All other systems reviewed and are negative. Blood pressure (!) 119/94, pulse 92, temperature 98.2 F (36.8 C), resp. rate 20, height '5\' 8"'$  (1.727 m), weight 84.4 kg, SpO2 98 %. Body mass index is 28.28 kg/m.   COGNITIVE FEATURES THAT CONTRIBUTE TO RISK:  None    SUICIDE RISK:   Mild:  Suicidal ideation of limited frequency, intensity, duration, and specificity.  There are no identifiable plans, no associated intent, mild dysphoria and related symptoms, good self-control (both objective and subjective assessment), few other risk factors, and identifiable protective factors, including available and accessible social support.  PLAN OF CARE:  Major depressive disorder, recurrent, severe without psychosis: Increased his Effexor 75 mg daily to 150 mg daily Continued Lithium 450 mg BID Continued Seroquel 300 mg at bedtime  Anxiety: Continued hydroxyzine 25 mg TID PRN  Insomnia: Continued Trazodone 50 mg daily at bedtime PRN Continued Remeron 45 mg daily at bedtime  I certify that inpatient services furnished can reasonably be expected to improve the patient's condition.   Waylan Boga, NP 09/14/2021, 7:49 AM

## 2021-09-14 NOTE — BH IP Treatment Plan (Signed)
Interdisciplinary Treatment and Diagnostic Plan Update  09/14/2021 Time of Session: 9:30AM Casey Silva MRN: [id number]  Principal Diagnosis: Major depressive disorder, recurrent severe without psychotic features (Casey Silva)  Secondary Diagnoses: Principal Problem:   Major depressive disorder, recurrent severe without psychotic features (Casey Silva)   Current Medications:  Current Facility-Administered Medications  Medication Dose Route Frequency Provider Last Rate Last Admin   acetaminophen (TYLENOL) tablet 650 mg  650 mg Oral Q6H PRN Clapacs, [name], MD       alum & mag hydroxide-simeth (MAALOX/MYLANTA) 200-200-20 MG/5ML suspension 30 mL  30 mL Oral Q4H PRN Clapacs, [name], MD       amLODipine (NORVASC) tablet 10 mg  10 mg Oral Daily Clapacs, [name], MD   10 mg at 09/14/21 0850   famotidine (PEPCID) tablet 20 mg  20 mg Oral Daily Clapacs, [name], MD   20 mg at 09/14/21 0850   hydrOXYzine (ATARAX) tablet 25 mg  25 mg Oral TID PRN Clapacs, [name], MD       lidocaine (LIDODERM) 5 % 2 patch  2 patch Transdermal Q24H Clapacs, [name], MD   2 patch at 09/14/21 1233   lithium carbonate (ESKALITH) CR tablet 450 mg  450 mg Oral Q12H Clapacs, [name], MD   450 mg at 09/14/21 2725   magnesium hydroxide (MILK OF MAGNESIA) suspension 30 mL  30 mL Oral Daily PRN Clapacs, [name], MD       methocarbamol (ROBAXIN) tablet 500 mg  500 mg Oral BID Clapacs, [name], MD   500 mg at 09/14/21 0850   mirtazapine (REMERON) tablet 45 mg  45 mg Oral QHS Clapacs, [name], MD   45 mg at 09/13/21 2129   QUEtiapine (SEROQUEL) tablet 300 mg  300 mg Oral QHS Clapacs, [name], MD   300 mg at 09/13/21 2128   traZODone (DESYREL) tablet 50 mg  50 mg Oral QHS PRN Clapacs, [name], MD       [START ON 09/15/2021] venlafaxine XR (EFFEXOR-XR) 24 hr capsule 150 mg  150 mg Oral Q breakfast [name], NP       PTA Medications: Medications Prior to Admission  Medication Sig Dispense Refill Last Dose   amLODipine (NORVASC) 10 MG tablet Take 1  tablet (10 mg total) by mouth daily.      famotidine (PEPCID) 20 MG tablet Take 1 tablet (20 mg total) by mouth daily.      hydrOXYzine (ATARAX) 25 MG tablet Take 1 tablet (25 mg total) by mouth 3 (three) times daily as needed for anxiety. 30 tablet 0    lidocaine (LIDODERM) 5 % Place 2 patches onto the skin daily. Remove & Discard patch within 12 hours or as directed by MD 30 patch 0    lithium carbonate (ESKALITH) 450 MG CR tablet Take 1 tablet (450 mg total) by mouth every 12 (twelve) hours.      methocarbamol (ROBAXIN) 500 MG tablet Take 1 tablet (500 mg total) by mouth 2 (two) times daily for 5 days. 10 tablet 0    mirtazapine (REMERON) 45 MG tablet Take 1 tablet (45 mg total) by mouth at bedtime.      QUEtiapine (SEROQUEL) 300 MG tablet Take 1 tablet (300 mg total) by mouth at bedtime.      traZODone (DESYREL) 50 MG tablet Take 1 tablet (50 mg total) by mouth at bedtime as needed for sleep.      venlafaxine XR (EFFEXOR-XR) 75 MG 24 hr capsule  Take 1 capsule (75 mg total) by mouth daily with breakfast.       Patient Stressors: Health problems    Patient Strengths: Capable of independent living   Treatment Modalities: Medication Management, Group therapy, Case management,  1 to 1 session with clinician, Psychoeducation, Recreational therapy.   Physician Treatment Plan for Primary Diagnosis: Major depressive disorder, recurrent severe without psychotic features (Casey Silva) Long Term Goal(s): Improvement in symptoms so as ready for discharge   Short Term Goals: Ability to identify changes in lifestyle to reduce recurrence of condition will improve Ability to verbalize feelings will improve Ability to disclose and discuss suicidal ideas Ability to demonstrate self-control will improve Ability to identify and develop effective coping behaviors will improve Ability to maintain clinical measurements within normal limits will improve  Medication Management: Evaluate patient's response, side  effects, and tolerance of medication regimen.  Therapeutic Interventions: 1 to 1 sessions, Unit Group sessions and Medication administration.  Evaluation of Outcomes: Not Met  Physician Treatment Plan for Secondary Diagnosis: Principal Problem:   Major depressive disorder, recurrent severe without psychotic features ([place])  Long Term Goal(s): Improvement in symptoms so as ready for discharge   Short Term Goals: Ability to identify changes in lifestyle to reduce recurrence of condition will improve Ability to verbalize feelings will improve Ability to disclose and discuss suicidal ideas Ability to demonstrate self-control will improve Ability to identify and develop effective coping behaviors will improve Ability to maintain clinical measurements within normal limits will improve     Medication Management: Evaluate patient's response, side effects, and tolerance of medication regimen.  Therapeutic Interventions: 1 to 1 sessions, Unit Group sessions and Medication administration.  Evaluation of Outcomes: Not Met   RN Treatment Plan for Primary Diagnosis: Major depressive disorder, recurrent severe without psychotic features (Casey Silva) Long Term Goal(s): Knowledge of disease and therapeutic regimen to maintain health will improve  Short Term Goals: Ability to remain free from injury will improve, Ability to verbalize frustration and anger appropriately will improve, Ability to demonstrate self-control, Ability to participate in decision making will improve, Ability to verbalize feelings will improve, Ability to disclose and discuss suicidal ideas, Ability to identify and develop effective coping behaviors will improve, and Compliance with prescribed medications will improve  Medication Management: RN will administer medications as ordered by provider, will assess and evaluate patient's response and provide education to patient for prescribed medication. RN will report any adverse and/or side  effects to prescribing provider.  Therapeutic Interventions: 1 on 1 counseling sessions, Psychoeducation, Medication administration, Evaluate responses to treatment, Monitor vital signs and CBGs as ordered, Perform/monitor CIWA, COWS, AIMS and Fall Risk screenings as ordered, Perform wound care treatments as ordered.  Evaluation of Outcomes: Not Met   LCSW Treatment Plan for Primary Diagnosis: Major depressive disorder, recurrent severe without psychotic features ([place]) Long Term Goal(s): Safe transition to appropriate next level of care at discharge, Engage patient in therapeutic group addressing interpersonal concerns.  Short Term Goals: Engage patient in aftercare planning with referrals and resources, Increase social support, Increase ability to appropriately verbalize feelings, Increase emotional regulation, Facilitate acceptance of mental health diagnosis and concerns, Identify triggers associated with mental health/substance abuse issues, and Increase skills for wellness and recovery  Therapeutic Interventions: Assess for all discharge needs, 1 to 1 time with Social worker, Explore available resources and support systems, Assess for adequacy in community support network, Educate family and significant other(s) on suicide prevention, Complete Psychosocial Assessment, Interpersonal group therapy.  Evaluation of Outcomes: Not  Met   Progress in Treatment: Attending groups: No. Participating in groups: No. Taking medication as prescribed: Yes. Toleration medication: Yes. Family/Significant other contact made: No, will contact:  when given permission Patient understands diagnosis: Yes. Discussing patient identified problems/goals with staff: No. Medical problems stabilized or resolved: Yes. Denies suicidal/homicidal ideation: Yes. Issues/concerns per patient self-inventory: No. Other: None  Casey problem(s) identified: No, Describe:  None  Casey Short Term/Long Term Goal(s): Patient to  work towards medication management for mood stabilization; elimination of SI thoughts; development of comprehensive mental wellness plan.  Patient Goals: Per H&P, patient is interested in medication stabilization and obtaining an ACT team.   Discharge Plan or Barriers: CSW will assist pt with development of appropriate discharge/aftercare plan. No psychosocial barriers identified with pt returning home.   Reason for Continuation of Hospitalization: Anxiety Depression Medication stabilization  Estimated Length of Stay: 1-7 days  Last 3 [place] Suicide Severity Risk Score: Flowsheet Row Admission (Current) from 09/13/2021 in [facility] Most recent reading at 09/13/2021  3:00 PM ED from 09/10/2021 in [facility] Most recent reading at 09/11/2021 12:32 AM ED from 09/10/2021 in [place] Most recent reading at 09/10/2021  5:49 PM  C-SSRS RISK CATEGORY Error: Question 6 not populated Low Risk No Risk       Last PHQ 2/9 Scores:    09/11/2021    6:41 PM 06/07/2021    4:31 AM 03/30/2021    2:06 PM  Depression screen PHQ 2/9  Decreased Interest _0 Down, Depressed, Hopeless _1 PHQ - 2 Score _2 Altered sleeping _3 Tired, decreased energy _4 Change in appetite _5 Feeling bad or failure about yourself  _6 Trouble concentrating _7 Moving slowly or fidgety/restless _8 Suicidal thoughts _9 PHQ-9 Score _10 Difficult doing work/chores Extremely dIfficult Extremely dIfficult Very difficult    Scribe for Treatment Team: Faylynn Stamos A [place], [place] 09/14/2021 3:05 PM

## 2021-09-14 NOTE — BHH Counselor (Signed)
CSW made first attempt to complete patient PSA. Patient was sleeping and when roused stated he was too tired to talk. CSW will make a second attempt to talk with pt at another opportune time.   Taylee Gunnells [place], MSW, LCSW-A 6/7/20232:04 PM

## 2021-09-14 NOTE — Plan of Care (Signed)
atient is alert and oriented times 4. Mood and affect blank and sullen. Patient rates pain as 4/10 to lower back. He denies SI, HI, and AVH. Expresses feelings feelings of anxiety and depression at this time. States he didn't sleep too good last night. Morning meds given whole by mouth W/O difficulty. Ate breakfast in day room- appetite good. Patient remains on unit with Q15 minute checks in place.     Problem: Education: Goal: Knowledge of [place] [facility] Education information/materials will improve Outcome: Progressing Goal: Emotional status will improve Outcome: Progressing Goal: Mental status will improve Outcome: Progressing Goal: Verbalization of understanding the information provided will improve Outcome: Progressing   Problem: Activity: Goal: Interest or engagement in activities will improve Outcome: Progressing Goal: Sleeping patterns will improve Outcome: Progressing   Problem: Coping: Goal: Ability to verbalize frustrations and anger appropriately will improve Outcome: Progressing Goal: Ability to demonstrate self-control will improve Outcome: Progressing   Problem: Health Behavior/Discharge Planning: Goal: Identification of resources available to assist in meeting health care needs will improve Outcome: Progressing Goal: Compliance with treatment plan for underlying cause of condition will improve Outcome: Progressing   Problem: Physical Regulation: Goal: Ability to maintain clinical measurements within normal limits will improve Outcome: Progressing   Problem: Safety: Goal: Periods of time without injury will increase Outcome: Progressing   Problem: Education: Goal: Utilization of techniques to improve thought processes will improve Outcome: Progressing Goal: Knowledge of the prescribed therapeutic regimen will improve Outcome: Progressing   Problem: Activity: Goal: Interest or engagement in leisure activities will improve Outcome: Progressing Goal:  Imbalance in normal sleep/wake cycle will improve Outcome: Progressing   Problem: Coping: Goal: Coping ability will improve Outcome: Progressing Goal: Will verbalize feelings Outcome: Progressing   Problem: Health Behavior/Discharge Planning: Goal: Ability to make decisions will improve Outcome: Progressing Goal: Compliance with therapeutic regimen will improve Outcome: Progressing   Problem: Role Relationship: Goal: Will demonstrate positive changes in social behaviors and relationships Outcome: Progressing   Problem: Safety: Goal: Ability to disclose and discuss suicidal ideas will improve Outcome: Progressing Goal: Ability to identify and utilize support systems that promote safety will improve Outcome: Progressing   Problem: Self-Concept: Goal: Will verbalize positive feelings about self Outcome: Progressing Goal: Level of anxiety will decrease Outcome: Progressing

## 2021-09-15 DIAGNOSIS — F332 Major depressive disorder, recurrent severe without psychotic features: Secondary | ICD-10-CM | POA: Diagnosis not present

## 2021-09-15 NOTE — Progress Notes (Signed)
Recreation Therapy Notes  Date: 09/15/2021   Time: 1:30pm     Location: Craft room     Behavioral response: Appropriate   Intervention Topic: Relaxation   Discussion/Intervention:  Group content today was focused on relaxation. The group defined relaxation and identified healthy ways to relax. Individuals expressed how much time they spend relaxing. Patients expressed how much their life would be if they did not make time for themselves to relax. The group stated ways they could improve their relaxation techniques in the future.  Individuals participated in the intervention "Time to Relax" where they had a chance to experience different relaxation techniques.  Clinical Observations/Feedback: [name] and was focused on what peers and staff has to say about relaxation. Individual was social with peers and staff while participating in the intervention.  Ayvion Kavanagh LRT/CTRS           Yuri Fana 09/15/2021 3:59 PM

## 2021-09-15 NOTE — BHH Counselor (Signed)
CSW sent referral for ACTT services to [facility].   Casey Silva, MSW, LCSW-A 6/8/20233:47 PM

## 2021-09-15 NOTE — Progress Notes (Signed)
Pt lying in bed with eyes closed; easily aroused when name called. Pt states that he feels "tired". He denies pain. He endorses "a little" SI; however, he does verbally contracts for safety. He currently denies HI/AVH. He describes his sleep as "not good" and reports trouble falling and staying asleep. He describes his appetite as "good". He denies anxiety at this time but expresses feelings of depression and states "I've had it all my life." No acute distress noted.
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
## 2021-09-15 NOTE — Plan of Care (Signed)
Patient calm and cooperative on unit during shift.  Pt denies SI/HI/AV/H and contracts for safety.  Pt did verbalize 4/10 Anxiety and 8/10 Depression.  Pt states "I am alone and my Back pain triggers my Depression.  Medications given to manage symptoms.  Patient did have adequate intake and hydration. Pt denies any healthy coping skills.  Nurse encouraged pt to attend group therapy in efforts to learn healthy coping skills. Pt verbalized importance of compliance with treatment plan. Pt did verbalize restful sleep.  Q 15 minute safety checks in place.  Will continue to manage pt during shift.      Problem: Education: Goal: Knowledge of [facility] Education information/materials will improve Outcome: Progressing Goal: Emotional status will improve Outcome: Progressing Goal: Mental status will improve Outcome: Progressing

## 2021-09-15 NOTE — Progress Notes (Addendum)
[facility] MD Progress Note  09/15/2021 7:16 AM Casey Silva  MRN:  [id number]  Subjective:  "I'm a little better."  He reports he did get up this morning and plans to shower later.  Complains of back pain and did take Tylenol earlier, currently resting on his bed.  High depression with chronic suicidal ideations at baseline.  Minimal anxiety.  Discussed discharge planning and what his needs were.  He feels just waiting "for the meds to kick in".  Exploring an ACT team to assist him after discharge, projected discharge date of Monday since he was at the [facility] for a few days already.  Principal Problem: Major depressive disorder, recurrent severe without psychotic features ([place]) Diagnosis: Principal Problem:   Major depressive disorder, recurrent severe without psychotic features ([place])  Total Time spent with patient: 30 minutes  Past Psychiatric History: depression, anxiety  Past Medical History:  Past Medical History:  Diagnosis Date   Anxiety    Asthma    Depression    History reviewed. No pertinent surgical history. Family History:  Family History  Problem Relation Age of Onset   Bipolar disorder Mother    Family Psychiatric  History: see above Social History:  Social History   Substance and Sexual Activity  Alcohol Use No     Social History   Substance and Sexual Activity  Drug Use No    Social History   Socioeconomic History   Marital status: Single    Spouse name: Not on file   Number of children: 1   Years of education: Not on file   Highest education level: GED or equivalent  Occupational History   Not on file  Tobacco Use   Smoking status: Never   Smokeless tobacco: Never  Vaping Use   Vaping Use: Unknown  Substance and Sexual Activity   Alcohol use: No   Drug use: No   Sexual activity: Not Currently  Other Topics Concern   Not on file  Social History Narrative   Marital status:  Single   Children: Daughter   Employment:  Unemployment. previous work for  [employer].   Alcohol:  None   Drugs:  none   Exercise:  Regularly very   Religion:  Jewish   Social Determinants of Health   Financial Resource Strain: Medium Risk (10/02/2019)   Overall Financial Resource Strain (CARDIA)    Difficulty of Paying Living Expenses: Somewhat hard  Food Insecurity: Not on file  Transportation Needs: Not on file  Physical Activity: Insufficiently Active (10/02/2019)   Exercise Vital Sign    Days of Exercise per Week: 2 days    Minutes of Exercise per Session: 30 min  Stress: Stress Concern Present (10/02/2019)   [place]    Feeling of Stress : Rather much  Social Connections: Socially Isolated (10/02/2019)   Social Connection and Isolation Panel [NHANES]    Frequency of Communication with Friends and Family: Never    Frequency of Social Gatherings with Friends and Family: Never    Attends Religious Services: Never    Marine scientist or Organizations: No    Attends Music therapist: Never    Marital Status: Divorced   Additional Social History: lives in a boarding house      Sleep: Good  Appetite:  Fair  Current Medications: Current Facility-Administered Medications  Medication Dose Route Frequency Provider Last Rate Last Admin   acetaminophen (TYLENOL) tablet 650 mg  650  mg Oral Q6H PRN Clapacs, [name], MD       alum & mag hydroxide-simeth (MAALOX/MYLANTA) 200-200-20 MG/5ML suspension 30 mL  30 mL Oral Q4H PRN Clapacs, [name], MD       amLODipine (NORVASC) tablet 10 mg  10 mg Oral Daily Clapacs, [name], MD   10 mg at 09/14/21 0850   famotidine (PEPCID) tablet 20 mg  20 mg Oral Daily Clapacs, [name], MD   20 mg at 09/14/21 0850   hydrOXYzine (ATARAX) tablet 25 mg  25 mg Oral TID PRN Clapacs, [name], MD       lidocaine (LIDODERM) 5 % 2 patch  2 patch Transdermal Q24H Clapacs, [name], MD   2 patch at 09/14/21 1233   lithium carbonate (ESKALITH) CR tablet 450  mg  450 mg Oral Q12H Clapacs, [name], MD   450 mg at 09/14/21 2202   magnesium hydroxide (MILK OF MAGNESIA) suspension 30 mL  30 mL Oral Daily PRN Clapacs, [name], MD       methocarbamol (ROBAXIN) tablet 500 mg  500 mg Oral BID Clapacs, [name], MD   500 mg at 09/14/21 2202   mirtazapine (REMERON) tablet 45 mg  45 mg Oral QHS Clapacs, [name], MD   45 mg at 09/14/21 2202   QUEtiapine (SEROQUEL) tablet 300 mg  300 mg Oral QHS Clapacs, [name], MD   300 mg at 09/14/21 2202   traZODone (DESYREL) tablet 50 mg  50 mg Oral QHS PRN Clapacs, [name], MD       venlafaxine XR (EFFEXOR-XR) 24 hr capsule 150 mg  150 mg Oral Q breakfast [name], NP        Lab Results:  Results for orders placed or performed during the hospital encounter of 09/10/21 (from the past 48 hour(s))  Resp Panel by RT-PCR (Flu A&B, Covid) Anterior Nasal Swab     Status: None   Collection Time: 09/13/21 12:40 PM   Specimen: Anterior Nasal Swab  Result Value Ref Range   SARS Coronavirus 2 by RT PCR NEGATIVE NEGATIVE    Comment: (NOTE) SARS-CoV-2 target nucleic acids are NOT DETECTED.  The SARS-CoV-2 RNA is generally detectable in upper respiratory specimens during the acute phase of infection. The lowest concentration of SARS-CoV-2 viral copies this assay can detect is 138 copies/mL. A negative result does not preclude SARS-Cov-2 infection and should not be used as the sole basis for treatment or other patient management decisions. A negative result may occur with  improper specimen collection/handling, submission of specimen other than nasopharyngeal swab, presence of viral mutation(s) within the areas targeted by this assay, and inadequate number of viral copies(<138 copies/mL). A negative result must be combined with clinical observations, patient history, and epidemiological information. The expected result is Negative.  Fact Sheet for Patients:  EntrepreneurPulse.com.au  Fact Sheet for Healthcare  Providers:  IncredibleEmployment.be  This test is no t yet approved or cleared by the [place] FDA and  has been authorized for detection and/or diagnosis of SARS-CoV-2 by FDA under an Emergency Use Authorization (EUA). This EUA will remain  in effect (meaning this test can be used) for the duration of the COVID-19 declaration under Section 564(b)(1) of the Act, 21 U.S.C.section 360bbb-3(b)(1), unless the authorization is terminated  or revoked sooner.       Influenza A by PCR NEGATIVE NEGATIVE   Influenza B by PCR NEGATIVE NEGATIVE    Comment: (NOTE) The Xpert Xpress SARS-CoV-2/FLU/RSV plus  assay is intended as an aid in the diagnosis of influenza from Nasopharyngeal swab specimens and should not be used as a sole basis for treatment. Nasal washings and aspirates are unacceptable for Xpert Xpress SARS-CoV-2/FLU/RSV testing.  Fact Sheet for Patients: EntrepreneurPulse.com.au  Fact Sheet for Healthcare Providers: IncredibleEmployment.be  This test is not yet approved or cleared by the [place] FDA and has been authorized for detection and/or diagnosis of SARS-CoV-2 by FDA under an Emergency Use Authorization (EUA). This EUA will remain in effect (meaning this test can be used) for the duration of the COVID-19 declaration under Section 564(b)(1) of the Act, 21 U.S.C. section 360bbb-3(b)(1), unless the authorization is terminated or revoked.  Performed at [facility], [place] [address] [place], [place] [address]     Blood Alcohol level:  Lab Results  Component Value Date   ETH <10 06/07/2021   ETH <10 16/60/6301    Metabolic Disorder Labs: Lab Results  Component Value Date   HGBA1C 5.1 06/07/2021   MPG 99.67 06/07/2021   MPG 111.15 11/24/2020   No results found for: "PROLACTIN" Lab Results  Component Value Date   CHOL 197 06/07/2021   TRIG 149 06/07/2021   HDL 44 06/07/2021   CHOLHDL 4.5  06/07/2021   VLDL 30 06/07/2021   LDLCALC 123 (H) 06/07/2021   LDLCALC 148 (H) 11/24/2020     Musculoskeletal: Strength & Muscle Tone: within normal limits Gait & Station: normal Patient leans: N/A  Psychiatric Specialty Exam: Physical Exam Vitals and nursing note reviewed.  Constitutional:      Appearance: Normal appearance.  HENT:     Head: Normocephalic.     Nose: Nose normal.  Pulmonary:     Effort: Pulmonary effort is normal.  Musculoskeletal:        General: Normal range of motion.     Cervical back: Normal range of motion.  Neurological:     General: No focal deficit present.     Mental Status: He is alert and oriented to person, place, and time.  Psychiatric:        Attention and Perception: Attention and perception normal.        Mood and Affect: Mood is anxious and depressed.        Speech: Speech normal.        Behavior: Behavior normal. Behavior is cooperative.        Thought Content: Thought content includes suicidal ideation.        Cognition and Memory: Cognition and memory normal.        Judgment: Judgment normal.     Review of Systems  Constitutional:  Positive for malaise/fatigue.  Psychiatric/Behavioral:  Positive for depression. The patient is nervous/anxious.   All other systems reviewed and are negative.   Blood pressure 110/68, pulse 70, temperature 98.6 F (37 C), resp. rate 16, height '5\' 8"'$  (1.727 m), weight 84.4 kg, SpO2 95 %.Body mass index is 28.28 kg/m.  General Appearance: Disheveled hair  Eye Contact:  Good  Speech:  Normal Rate  Volume:  Normal  Mood:  Anxious and Depressed  Affect:  Congruent  Thought Process:  Coherent  Orientation:  Full (Time, Place, and Person)  Thought Content:  Rumination  Suicidal Thoughts:  Yes.  without intent/plan  Homicidal Thoughts:  No  Memory:  Immediate;   Good Recent;   Good Remote;   Good  Judgement:  Fair  Insight:  Fair  Psychomotor Activity:  Decreased  Concentration:  Concentration:  Good  and Attention Span: Good  Recall:  [place] of Knowledge:  Good  Language:  Good  Akathisia:  No  Handed:  Right  AIMS (if indicated):     Assets:  Housing Leisure Time Physical Health Resilience  ADL's:  Intact  Cognition:  WNL  Sleep:         Physical Exam: Physical Exam Vitals and nursing note reviewed.  Constitutional:      Appearance: Normal appearance.  HENT:     Head: Normocephalic.     Nose: Nose normal.  Pulmonary:     Effort: Pulmonary effort is normal.  Musculoskeletal:        General: Normal range of motion.     Cervical back: Normal range of motion.  Neurological:     General: No focal deficit present.     Mental Status: He is alert and oriented to person, place, and time.  Psychiatric:        Attention and Perception: Attention and perception normal.        Mood and Affect: Mood is anxious and depressed.        Speech: Speech normal.        Behavior: Behavior normal. Behavior is cooperative.        Thought Content: Thought content includes suicidal ideation.        Cognition and Memory: Cognition and memory normal.        Judgment: Judgment normal.    Review of Systems  Constitutional:  Positive for malaise/fatigue.  Psychiatric/Behavioral:  Positive for depression. The patient is nervous/anxious.   All other systems reviewed and are negative.  Blood pressure 110/68, pulse 70, temperature 98.6 F (37 C), resp. rate 16, height '5\' 8"'$  (1.727 m), weight 84.4 kg, SpO2 95 %. Body mass index is 28.28 kg/m.   Treatment Plan Summary: Daily contact with patient to assess and evaluate symptoms and progress in treatment, Medication management, and Plan : Major depressive disorder, recurrent, severe without psychosis: Continue Lithium 450 mg BID Increase Effexor 75 mg daily to 150 mg daily   Insomnia: Continue Trazodone 50 mg daily at bedtime PRN Continue Remeron 45 mg daily at bedtime   Anxiety: Continue hydroxyzine 25 mg TID PRN  Waylan Boga, NP 09/15/2021, 7:16 AM

## 2021-09-16 DIAGNOSIS — F332 Major depressive disorder, recurrent severe without psychotic features: Secondary | ICD-10-CM | POA: Diagnosis not present

## 2021-09-16 MED ORDER — POLYETHYLENE GLYCOL 3350 17 G PO PACK
17.0000 g | PACK | Freq: Every day | ORAL | Status: DC
  Administered 2021-09-16 – 2021-09-20 (×4): 17 g via ORAL
  Filled 2021-09-16 (×5): qty 1

## 2021-09-16 NOTE — Progress Notes (Signed)
This writer assumed care of patient at 1500. Patient is laying in bedroom. He denies having any pain. Patient reports that he had a very small bowel movement after taking Milk of Magnesia. Miralax given. Patient remains safe on the unit at this time.

## 2021-09-16 NOTE — Plan of Care (Signed)
Patient is mostly in room and comes out for medications and other needs. Continues to complain of constipation and receiving related care. Alert and oriented x 4. Denyings SI/HI/AVH. Appears disheveled and anxious. Emotional support provided. Safety precautions maintained.

## 2021-09-16 NOTE — Progress Notes (Signed)
Recreation Therapy Notes    Date: 09/16/2021   Time: 10:50am    Location: Craft room      Behavioral response: N/A   Intervention Topic: Decision Making     Discussion/Intervention: Patient refused to attend group.    Clinical Observations/Feedback:  Patient refused to attend group.    Baylor Cortez LRT/CTRS        Janice Seales 09/16/2021 11:05 AM

## 2021-09-16 NOTE — BHH Counselor (Signed)
CSW was contacted by Remo Lipps at [employer], [phone] regarding referral for ACTT services. He stated that he would meet pt on Monday at 1pm and determine if pt meets ACTT criteria for enrollment.   No other requests were made. Conversation ended without incident.   Yuriy Cui [place], MSW, LCSW-A 6/9/20234:41 PM

## 2021-09-16 NOTE — BH Assessment (Signed)
Received  patient lying in bed resting with eyes closed he did respond to name being called by turning toward the voice speaking with him. He is endorsing passive SI and has agreed to contract for safety. He is currently denying HI, A/V hallucinations, his affect is flat and he does not comment on his mood. He does say "Yes" feeling depressed.   2045 He spent most of the shift in bed. He did not come out for a snack but did present to the nurses station stating he did not get any of his daytime medication. This Probation officer reviewed his MAR and informed patient that it was documented that he got his medication at 0930 this am. Patient also informed that his pain patches had been rescheduled as a night time medication.   0630 Patient has not made any complaints of back pain since HS scheduled medication was given. He has remained asleep in his bed.

## 2021-09-16 NOTE — Progress Notes (Signed)
Recreation Therapy Notes  INPATIENT RECREATION THERAPY ASSESSMENT  Patient Details Name: Garey Alleva MRN: [id number] DOB: [birth year] Today's Date: 09/16/2021       Information Obtained From: Patient  Able to Participate in Assessment/Interview: Yes  Patient Presentation: Responsive  Reason for Admission (Per Patient): Active Symptoms  Patient Stressors:    Coping Skills:   Other (Comment) (Nothing)  Leisure Interests (2+):  Lawyer (Ski)  Frequency of Recreation/Participation: Other (Comment) (Not in a while)  Awareness of Community Resources:     [employer]:     Current Use:    If no, Barriers?:    Expressed Interest in [place] of Residence:  [place]  Patient Main Form of Transportation: Diplomatic Services operational officer  Patient Strengths:  Survival  Patient Identified Areas of Improvement:  alot  Patient Goal for Hospitalization:  My depression  Current SI (including self-harm):  Yes (No plan)  Current HI:  No  Current AVH: No  Staff Intervention Plan: Group Attendance, Collaborate with Interdisciplinary Treatment Team  Consent to Intern Participation: N/A  Tieasha Larsen 09/16/2021, 1:05 PM

## 2021-09-16 NOTE — Progress Notes (Signed)
[facility] MD Progress Note  09/16/2021 4:10 PM Casey Silva  MRN:  [id number] Subjective: Follow-up patient with severe depression.  Patient tells me he continues to be very depressed down low energy negative mood and feeling hopeless.  Suicidal ideation is present without a specific plan.  Denies acute psychotic symptoms.  Complains of constipation probably related in part to being dehydrated. Principal Problem: Major depressive disorder, recurrent severe without psychotic features ([place]) Diagnosis: Principal Problem:   Major depressive disorder, recurrent severe without psychotic features ([place])  Total Time spent with patient: 30 minutes  Past Psychiatric History: Past history of severe depression with previous partial response to ECT  Past Medical History:  Past Medical History:  Diagnosis Date   Anxiety    Asthma    Depression    History reviewed. No pertinent surgical history. Family History:  Family History  Problem Relation Age of Onset   Bipolar disorder Mother    Family Psychiatric  History: See previous Social History:  Social History   Substance and Sexual Activity  Alcohol Use No     Social History   Substance and Sexual Activity  Drug Use No    Social History   Socioeconomic History   Marital status: Single    Spouse name: Not on file   Number of children: 1   Years of education: Not on file   Highest education level: GED or equivalent  Occupational History   Not on file  Tobacco Use   Smoking status: Never   Smokeless tobacco: Never  Vaping Use   Vaping Use: Unknown  Substance and Sexual Activity   Alcohol use: No   Drug use: No   Sexual activity: Not Currently  Other Topics Concern   Not on file  Social History Narrative   Marital status:  Single   Children: Daughter   Employment:  Unemployment. previous work for [employer].   Alcohol:  None   Drugs:  none   Exercise:  Regularly very   Religion:  Jewish   Social Determinants of Health    Financial Resource Strain: Medium Risk (10/02/2019)   Overall Financial Resource Strain (CARDIA)    Difficulty of Paying Living Expenses: Somewhat hard  Food Insecurity: No Food Insecurity (10/02/2019)   Hunger Vital Sign    Worried About Running Out of Food in the Last Year: Never true    Ran Out of Food in the Last Year: Never true  Transportation Needs: No Transportation Needs (10/02/2019)   PRAPARE - Hydrologist (Medical): No    Lack of Transportation (Non-Medical): No  Physical Activity: Insufficiently Active (10/02/2019)   Exercise Vital Sign    Days of Exercise per Week: 2 days    Minutes of Exercise per Session: 30 min  Stress: Stress Concern Present (10/02/2019)   [place]    Feeling of Stress : Rather much  Social Connections: Socially Isolated (10/02/2019)   Social Connection and Isolation Panel [NHANES]    Frequency of Communication with Friends and Family: Never    Frequency of Social Gatherings with Friends and Family: Never    Attends Religious Services: Never    Marine scientist or Organizations: No    Attends Archivist Meetings: Never    Marital Status: Divorced   Additional Social History:  Sleep: Fair  Appetite:  Fair  Current Medications: Current Facility-Administered Medications  Medication Dose Route Frequency Provider Last Rate Last Admin   acetaminophen (TYLENOL) tablet 650 mg  650 mg Oral Q6H PRN Javonna Balli T, MD   650 mg at 09/15/21 0830   alum & mag hydroxide-simeth (MAALOX/MYLANTA) 200-200-20 MG/5ML suspension 30 mL  30 mL Oral Q4H PRN Elias Dennington T, MD       amLODipine (NORVASC) tablet 10 mg  10 mg Oral Daily Chief Walkup, [name], MD   10 mg at 09/16/21 0935   famotidine (PEPCID) tablet 20 mg  20 mg Oral Daily Yisrael Obryan, [name], MD   20 mg at 09/16/21 0936   hydrOXYzine (ATARAX) tablet 25 mg  25 mg Oral TID  PRN Travian Kerner T, MD   25 mg at 09/15/21 0830   lidocaine (LIDODERM) 5 % 2 patch  2 patch Transdermal Q24H Joya Willmott, [name], MD   2 patch at 09/15/21 1652   lithium carbonate (ESKALITH) CR tablet 450 mg  450 mg Oral Q12H Jeimy Bickert T, MD   450 mg at 09/16/21 0936   magnesium hydroxide (MILK OF MAGNESIA) suspension 30 mL  30 mL Oral Daily PRN Keano Guggenheim T, MD   30 mL at 09/16/21 0933   methocarbamol (ROBAXIN) tablet 500 mg  500 mg Oral BID Hali Balgobin T, MD   500 mg at 09/16/21 0936   mirtazapine (REMERON) tablet 45 mg  45 mg Oral QHS Tesla Keeler T, MD   45 mg at 09/15/21 2133   polyethylene glycol (MIRALAX / GLYCOLAX) packet 17 g  17 g Oral Daily [name], NP   17 g at 09/16/21 1518   QUEtiapine (SEROQUEL) tablet 300 mg  300 mg Oral QHS Caelum Federici T, MD   300 mg at 09/15/21 2132   traZODone (DESYREL) tablet 50 mg  50 mg Oral QHS PRN Moriah Loughry T, MD       venlafaxine XR (EFFEXOR-XR) 24 hr capsule 150 mg  150 mg Oral Q breakfast [name], NP   150 mg at 09/16/21 8469    Lab Results: No results found for this or any previous visit (from the past 48 hour(s)).  Blood Alcohol level:  Lab Results  Component Value Date   ETH <10 06/07/2021   ETH <10 62/95/2841    Metabolic Disorder Labs: Lab Results  Component Value Date   HGBA1C 5.1 06/07/2021   MPG 99.67 06/07/2021   MPG 111.15 11/24/2020   No results found for: "PROLACTIN" Lab Results  Component Value Date   CHOL 197 06/07/2021   TRIG 149 06/07/2021   HDL 44 06/07/2021   CHOLHDL 4.5 06/07/2021   VLDL 30 06/07/2021   LDLCALC 123 (H) 06/07/2021   LDLCALC 148 (H) 11/24/2020    Physical Findings: AIMS:  , ,  ,  ,    CIWA:    COWS:     Musculoskeletal: Strength & Muscle Tone: within normal limits Gait & Station: normal Patient leans: N/A  Psychiatric Specialty Exam:  Presentation  General Appearance: Appropriate for Environment; Disheveled (hospital scrubs)  Eye  Contact:Minimal  Speech:Clear and Coherent; Normal Rate  Speech Volume:Normal  Handedness:Right   Mood and Affect  Mood:Depressed  Affect:Flat; Depressed; Congruent   Thought Process  Thought Processes:Coherent; Goal Directed; Linear  Descriptions of Associations:Intact  Orientation:Full (Time, Place and Person)  Thought Content:WDL; Logical  History of Schizophrenia/Schizoaffective disorder:No  Duration of Psychotic Symptoms:Less than six months  Hallucinations:No data  recorded Ideas of Reference:None  Suicidal Thoughts:No data recorded Homicidal Thoughts:No data recorded  Sensorium  Memory:Immediate Fair; Recent Fair; Remote Fair  Judgment:Fair  Insight:Fair   Executive Functions  Concentration:Fair  Attention Span:Fair  [place]   Psychomotor Activity  Psychomotor Activity:No data recorded  Assets  Assets:Communication Skills; Desire for Improvement; Resilience   Sleep  Sleep:No data recorded   Physical Exam: Physical Exam Vitals and nursing note reviewed.  Constitutional:      Appearance: Normal appearance.  HENT:     Head: Normocephalic and atraumatic.     Mouth/Throat:     Pharynx: Oropharynx is clear.  Eyes:     Pupils: Pupils are equal, round, and reactive to light.  Cardiovascular:     Rate and Rhythm: Normal rate and regular rhythm.  Pulmonary:     Effort: Pulmonary effort is normal.     Breath sounds: Normal breath sounds.  Abdominal:     General: Abdomen is flat.     Palpations: Abdomen is soft.  Musculoskeletal:        General: Normal range of motion.  Skin:    General: Skin is warm and dry.  Neurological:     General: No focal deficit present.     Mental Status: He is alert. Mental status is at baseline.  Psychiatric:        Attention and Perception: He is inattentive.        Mood and Affect: Mood is depressed. Affect is blunt.        Speech: Speech is delayed.         Behavior: Behavior is slowed.        Thought Content: Thought content includes suicidal ideation. Thought content does not include suicidal plan.        Cognition and Memory: Cognition is impaired.    Review of Systems  Constitutional: Negative.   HENT: Negative.    Eyes: Negative.   Respiratory: Negative.    Cardiovascular: Negative.   Gastrointestinal:  Positive for constipation.  Musculoskeletal: Negative.   Skin: Negative.   Neurological: Negative.   Psychiatric/Behavioral:  Positive for depression and suicidal ideas. The patient is nervous/anxious and has insomnia.    Blood pressure 133/85, pulse 86, temperature 98.2 F (36.8 C), resp. rate 18, height '5\' 8"'$  (1.727 m), weight 84.4 kg, SpO2 99 %. Body mass index is 28.28 kg/m.   Treatment Plan Summary: Medication management and Plan no change to medication.  Encourage group attendance and getting up out of bed.  Encourage eating and drinking lots of fluid.  We discussed the utilization of ECT and he is agreeable to retrying it so we will tentatively put him on the schedule for Monday  Alethia Berthold, MD 09/16/2021, 4:10 PM

## 2021-09-16 NOTE — BH Assessment (Signed)
Received patient lying in bed in his room. He was awaken for an interview event. He denied actively wanting to harm himself, but admit to being depressed. SI thoughts are fleeting and he is not having SI thoughts's because he just woke up.  He denied A/H hallucinations, and homicidal thoughts.When asked if he talked to family or friends he states "I don't have any family or friends." He appeared have gotten annoyed because I asked questions about family and friends. This Probation officer will continue to monitor patient for safety and progression of illness  Patient did later come out of his room make his needs known about what type of snacks he wanted. He approached the nurses station to ask if his juice be opened and responded "Thank you" after juice was opened. He again sat at the table with a peer but did not initiate conversation. When he was finished with his snack he returned to his room.  0630 Patient has remained safe and has slept for most of this shift. 0650 Patient continue to rest in bed with eyes closed.

## 2021-09-16 NOTE — Progress Notes (Signed)
Recreation Therapy Notes  INPATIENT RECREATION TR PLAN  Patient Details Name: Casey Silva MRN: [id number] DOB: Jun 30, 1955 Today's Date: 09/16/2021  Rec Therapy Plan Is patient appropriate for Therapeutic Recreation?: Yes Treatment times per week: at least 3 Estimated Length of Stay: 5-7 days TR Treatment/Interventions: Group participation (Comment)  Discharge Criteria Pt will be discharged from therapy if:: Discharged Treatment plan/goals/alternatives discussed and agreed upon by:: Patient/family  Discharge Summary     [name] 09/16/2021, 1:05 PM

## 2021-09-17 NOTE — Progress Notes (Signed)
[facility] MD Progress Note  09/17/2021 12:43 PM Casey Silva  MRN:  [id number] Subjective:  Patient awake observed in dayroom this morning eating breakfast and med compliant. Patient denies HI and A/V/H with no plan/intent but endorses passive SI. Patient rates his depression a 8 out of 10. Patient remains mostly in room wanting to rest. Verbalizes no further concerns at this time.  Principal Problem: Major depressive disorder, recurrent severe without psychotic features ([place]) Diagnosis: Principal Problem:   Major depressive disorder, recurrent severe without psychotic features ([place])  Total Time spent with patient: 20 minutes  Past Psychiatric History: MDD  Past Medical History:  Past Medical History:  Diagnosis Date   Anxiety    Asthma    Depression    History reviewed. No pertinent surgical history. Family History:  Family History  Problem Relation Age of Onset   Bipolar disorder Mother    Family Psychiatric  History:  Social History:  Social History   Substance and Sexual Activity  Alcohol Use No     Social History   Substance and Sexual Activity  Drug Use No    Social History   Socioeconomic History   Marital status: Single    Spouse name: Not on file   Number of children: 1   Years of education: Not on file   Highest education level: GED or equivalent  Occupational History   Not on file  Tobacco Use   Smoking status: Never   Smokeless tobacco: Never  Vaping Use   Vaping Use: Unknown  Substance and Sexual Activity   Alcohol use: No   Drug use: No   Sexual activity: Not Currently  Other Topics Concern   Not on file  Social History Narrative   Marital status:  Single   Children: Daughter   Employment:  Unemployment. previous work for [employer].   Alcohol:  None   Drugs:  none   Exercise:  Regularly very   Religion:  Jewish   Social Determinants of Health   Financial Resource Strain: Medium Risk (10/02/2019)   Overall Financial Resource Strain (CARDIA)     Difficulty of Paying Living Expenses: Somewhat hard  Food Insecurity: No Food Insecurity (10/02/2019)   Hunger Vital Sign    Worried About Running Out of Food in the Last Year: Never true    Ran Out of Food in the Last Year: Never true  Transportation Needs: No Transportation Needs (10/02/2019)   PRAPARE - Hydrologist (Medical): No    Lack of Transportation (Non-Medical): No  Physical Activity: Insufficiently Active (10/02/2019)   Exercise Vital Sign    Days of Exercise per Week: 2 days    Minutes of Exercise per Session: 30 min  Stress: Stress Concern Present (10/02/2019)   [place]    Feeling of Stress : Rather much  Social Connections: Socially Isolated (10/02/2019)   Social Connection and Isolation Panel [NHANES]    Frequency of Communication with Friends and Family: Never    Frequency of Social Gatherings with Friends and Family: Never    Attends Religious Services: Never    Marine scientist or Organizations: No    Attends Archivist Meetings: Never    Marital Status: Divorced   Additional Social History:                         Sleep: Fair  Appetite:  Good  Current Medications: Current Facility-Administered Medications  Medication Dose Route Frequency Provider Last Rate Last Admin   acetaminophen (TYLENOL) tablet 650 mg  650 mg Oral Q6H PRN Clapacs, [name], MD   650 mg at 09/15/21 0830   alum & mag hydroxide-simeth (MAALOX/MYLANTA) 200-200-20 MG/5ML suspension 30 mL  30 mL Oral Q4H PRN Clapacs, [name], MD       amLODipine (NORVASC) tablet 10 mg  10 mg Oral Daily Clapacs, [name], MD   10 mg at 09/17/21 0912   famotidine (PEPCID) tablet 20 mg  20 mg Oral Daily Clapacs, [name], MD   20 mg at 09/17/21 0912   hydrOXYzine (ATARAX) tablet 25 mg  25 mg Oral TID PRN Clapacs, [name], MD   25 mg at 09/15/21 0830   lidocaine (LIDODERM) 5 % 2 patch  2 patch  Transdermal Q24H Clapacs, [name], MD   2 patch at 09/16/21 2131   lithium carbonate (ESKALITH) CR tablet 450 mg  450 mg Oral Q12H Clapacs, [name], MD   450 mg at 09/17/21 0912   magnesium hydroxide (MILK OF MAGNESIA) suspension 30 mL  30 mL Oral Daily PRN Clapacs, [name], MD   30 mL at 09/16/21 0933   methocarbamol (ROBAXIN) tablet 500 mg  500 mg Oral BID Clapacs, [name], MD   500 mg at 09/17/21 0912   mirtazapine (REMERON) tablet 45 mg  45 mg Oral QHS Clapacs, [name], MD   45 mg at 09/16/21 2132   polyethylene glycol (MIRALAX / GLYCOLAX) packet 17 g  17 g Oral Daily [name], NP   17 g at 09/16/21 1518   QUEtiapine (SEROQUEL) tablet 300 mg  300 mg Oral QHS Clapacs, [name], MD   300 mg at 09/16/21 2133   traZODone (DESYREL) tablet 50 mg  50 mg Oral QHS PRN Clapacs, [name], MD       venlafaxine XR (EFFEXOR-XR) 24 hr capsule 150 mg  150 mg Oral Q breakfast [name], NP   150 mg at 09/17/21 8676    Lab Results: No results found for this or any previous visit (from the past 48 hour(s)).  Blood Alcohol level:  Lab Results  Component Value Date   ETH <10 06/07/2021   ETH <10 19/50/9326    Metabolic Disorder Labs: Lab Results  Component Value Date   HGBA1C 5.1 06/07/2021   MPG 99.67 06/07/2021   MPG 111.15 11/24/2020   No results found for: "PROLACTIN" Lab Results  Component Value Date   CHOL 197 06/07/2021   TRIG 149 06/07/2021   HDL 44 06/07/2021   CHOLHDL 4.5 06/07/2021   VLDL 30 06/07/2021   LDLCALC 123 (H) 06/07/2021   LDLCALC 148 (H) 11/24/2020    Physical Findings: AIMS:  , ,  ,  ,    CIWA:    COWS:     Musculoskeletal: Strength & Muscle Tone: within normal limits Gait & Station: normal Patient leans: N/A  Psychiatric Specialty Exam:  Presentation  General Appearance: Appropriate for Environment; Disheveled (hospital scrubs)  Eye Contact:Minimal  Speech:Clear and Coherent; Normal Rate  Speech Volume:Normal  Handedness:Right   Mood and  Affect  Mood:Depressed  Affect:Flat; Depressed; Congruent   Thought Process  Thought Processes:Coherent; Goal Directed; Linear  Descriptions of Associations:Intact  Orientation:Full (Time, Place and Person)  Thought Content:WDL; Logical  History of Schizophrenia/Schizoaffective disorder:No  Duration of Psychotic Symptoms:Less than six months  Hallucinations:No data recorded Ideas of Reference:None  Suicidal Thoughts:No  data recorded Homicidal Thoughts:No data recorded  Sensorium  Memory:Immediate Fair; Recent Fair; Remote Fair  Judgment:Fair  Insight:Fair   Executive Functions  Concentration:Fair  Attention Span:Fair  [place]   Psychomotor Activity  Psychomotor Activity:No data recorded  Assets  Assets:Communication Skills; Desire for Improvement; Resilience   Sleep  Sleep:No data recorded   Physical Exam: Physical Exam Vitals and nursing note reviewed.  Constitutional:      Appearance: Normal appearance. He is normal weight.  HENT:     Head: Normocephalic and atraumatic.     Right Ear: External ear normal.     Left Ear: External ear normal.     Nose: Nose normal.     Mouth/Throat:     Mouth: Mucous membranes are moist.     Pharynx: Oropharynx is clear.  Eyes:     Extraocular Movements: Extraocular movements intact.     Conjunctiva/sclera: Conjunctivae normal.     Pupils: Pupils are equal, round, and reactive to light.  Cardiovascular:     Rate and Rhythm: Normal rate and regular rhythm.     Pulses: Normal pulses.     Heart sounds: Normal heart sounds.  Pulmonary:     Effort: Pulmonary effort is normal.     Breath sounds: Normal breath sounds.  Abdominal:     General: Abdomen is flat. Bowel sounds are normal.     Palpations: Abdomen is soft.  Musculoskeletal:        General: Normal range of motion.     Cervical back: Normal range of motion and neck supple.  Skin:    General: Skin is warm  and dry.  Neurological:     General: No focal deficit present.     Mental Status: He is alert.  Psychiatric:        Behavior: Behavior normal.    Review of Systems  Constitutional: Negative.   HENT: Negative.    Eyes: Negative.   Respiratory: Negative.    Cardiovascular: Negative.   Gastrointestinal: Negative.   Genitourinary: Negative.   Musculoskeletal: Negative.   Skin: Negative.   Neurological: Negative.   Endo/Heme/Allergies: Negative.   Psychiatric/Behavioral:  Positive for depression and suicidal ideas. The patient is nervous/anxious.    Blood pressure 112/76, pulse 75, temperature 98.2 F (36.8 C), resp. rate 18, height '5\' 8"'$  (1.727 m), weight 84.4 kg, SpO2 98 %. Body mass index is 28.28 kg/m.   Treatment Plan Summary: Daily contact with patient to assess and evaluate symptoms and progress in treatment, Medication management, and Plan : Continue current meds and doses.   Antonieta Pert 09/17/2021, 12:43 PM

## 2021-09-17 NOTE — Progress Notes (Signed)
Patient awake observed in dayroom this morning eating breakfast and med compliant. Patient denies HI and A/V/H with no plan/intent but endorses passive SI. Patient rates his depression a 8 out of 10. Patient remains mostly in room wanting to rest. Verbalizes no further concerns at this time.

## 2021-09-18 NOTE — Plan of Care (Signed)
  Problem: Education: Goal: Knowledge of [facility] Education information/materials will improve Outcome: Progressing Goal: Emotional status will improve Outcome: Not Progressing Goal: Mental status will improve Outcome: Progressing Goal: Verbalization of understanding the information provided will improve Outcome: Progressing   Problem: Activity: Goal: Sleeping patterns will improve Outcome: Progressing   Problem: Coping: Goal: Ability to verbalize frustrations and anger appropriately will improve Outcome: Progressing Goal: Ability to demonstrate self-control will improve Outcome: Progressing

## 2021-09-18 NOTE — Progress Notes (Signed)
[facility] MD Progress Note  09/18/2021 12:46 PM Casey Silva  MRN:  [id number] Subjective:  Patient presents with sad affect but brightens on approach. Endorses passive SI, with no intent no plan. Requests medication for anxiety and insomnia. Given with good relief. Isolative to self and room. Minimal interaction with staff, no interaction with peers.   Patient remains isolative and depressed. He rates his depression November 25, 2022 and has passive death wish. He is compliant with all of his meds and is sleeping well.   Principal Problem: Major depressive disorder, recurrent severe without psychotic features ([place]) Diagnosis: Principal Problem:   Major depressive disorder, recurrent severe without psychotic features ([place])  Total Time spent with patient: 20 minutes Past Psychiatric History: MDD  Past Medical History:  Past Medical History:  Diagnosis Date   Anxiety    Asthma    Depression    History reviewed. No pertinent surgical history. Family History:  Family History  Problem Relation Age of Onset   Bipolar disorder Mother    Family Psychiatric  History:  Social History:  Social History   Substance and Sexual Activity  Alcohol Use No     Social History   Substance and Sexual Activity  Drug Use No    Social History   Socioeconomic History   Marital status: Single    Spouse name: Not on file   Number of children: 1   Years of education: Not on file   Highest education level: GED or equivalent  Occupational History   Not on file  Tobacco Use   Smoking status: Never   Smokeless tobacco: Never  Vaping Use   Vaping Use: Unknown  Substance and Sexual Activity   Alcohol use: No   Drug use: No   Sexual activity: Not Currently  Other Topics Concern   Not on file  Social History Narrative   Marital status:  Single   Children: Daughter   Employment:  Unemployment. previous work for [employer].   Alcohol:  None   Drugs:  none   Exercise:  Regularly very   Religion:  Jewish    Social Determinants of Health   Financial Resource Strain: Medium Risk (10/02/2019)   Overall Financial Resource Strain (CARDIA)    Difficulty of Paying Living Expenses: Somewhat hard  Food Insecurity: No Food Insecurity (10/02/2019)   Hunger Vital Sign    Worried About Running Out of Food in the Last Year: Never true    Ran Out of Food in the Last Year: Never true  Transportation Needs: No Transportation Needs (10/02/2019)   PRAPARE - Hydrologist (Medical): No    Lack of Transportation (Non-Medical): No  Physical Activity: Insufficiently Active (10/02/2019)   Exercise Vital Sign    Days of Exercise per Week: 2 days    Minutes of Exercise per Session: 30 min  Stress: Stress Concern Present (10/02/2019)   [place]    Feeling of Stress : Rather much  Social Connections: Socially Isolated (10/02/2019)   Social Connection and Isolation Panel [NHANES]    Frequency of Communication with Friends and Family: Never    Frequency of Social Gatherings with Friends and Family: Never    Attends Religious Services: Never    Marine scientist or Organizations: No    Attends Archivist Meetings: Never    Marital Status: Divorced   Additional Social History:  Sleep: Fair  Appetite:  Fair  Current Medications: Current Facility-Administered Medications  Medication Dose Route Frequency Provider Last Rate Last Admin   acetaminophen (TYLENOL) tablet 650 mg  650 mg Oral Q6H PRN Clapacs, [name], MD   650 mg at 09/15/21 0830   alum & mag hydroxide-simeth (MAALOX/MYLANTA) 200-200-20 MG/5ML suspension 30 mL  30 mL Oral Q4H PRN Clapacs, [name], MD       amLODipine (NORVASC) tablet 10 mg  10 mg Oral Daily Clapacs, [name], MD   10 mg at 09/18/21 0948   famotidine (PEPCID) tablet 20 mg  20 mg Oral Daily Clapacs, [name], MD   20 mg at 09/18/21 0948   hydrOXYzine  (ATARAX) tablet 25 mg  25 mg Oral TID PRN Clapacs, [name], MD   25 mg at 09/17/21 2106   lidocaine (LIDODERM) 5 % 2 patch  2 patch Transdermal Q24H Clapacs, [name], MD   2 patch at 09/17/21 1330   lithium carbonate (ESKALITH) CR tablet 450 mg  450 mg Oral Q12H Clapacs, [name], MD   450 mg at 09/18/21 0952   magnesium hydroxide (MILK OF MAGNESIA) suspension 30 mL  30 mL Oral Daily PRN Clapacs, [name], MD   30 mL at 09/16/21 0933   methocarbamol (ROBAXIN) tablet 500 mg  500 mg Oral BID Clapacs, [name], MD   500 mg at 09/18/21 0948   mirtazapine (REMERON) tablet 45 mg  45 mg Oral QHS Clapacs, [name], MD   45 mg at 09/17/21 2106   polyethylene glycol (MIRALAX / GLYCOLAX) packet 17 g  17 g Oral Daily [name], NP   17 g at 09/18/21 0948   QUEtiapine (SEROQUEL) tablet 300 mg  300 mg Oral QHS Clapacs, [name], MD   300 mg at 09/17/21 2106   traZODone (DESYREL) tablet 50 mg  50 mg Oral QHS PRN Clapacs, [name], MD   50 mg at 09/17/21 2106   venlafaxine XR (EFFEXOR-XR) 24 hr capsule 150 mg  150 mg Oral Q breakfast [name], NP   150 mg at 09/18/21 0900    Lab Results: No results found for this or any previous visit (from the past 48 hour(s)).  Blood Alcohol level:  Lab Results  Component Value Date   ETH <10 06/07/2021   ETH <10 19/62/2297    Metabolic Disorder Labs: Lab Results  Component Value Date   HGBA1C 5.1 06/07/2021   MPG 99.67 06/07/2021   MPG 111.15 11/24/2020   No results found for: "PROLACTIN" Lab Results  Component Value Date   CHOL 197 06/07/2021   TRIG 149 06/07/2021   HDL 44 06/07/2021   CHOLHDL 4.5 06/07/2021   VLDL 30 06/07/2021   LDLCALC 123 (H) 06/07/2021   LDLCALC 148 (H) 11/24/2020    Physical Findings: AIMS:  , ,  ,  ,    CIWA:    COWS:     Musculoskeletal: Strength & Muscle Tone: within normal limits Gait & Station: normal Patient leans: N/A  Psychiatric Specialty Exam:  Presentation  General Appearance: Disheveled  Eye  Contact:Fair  Speech:Slow  Speech Volume:Decreased  Handedness:Right   Mood and Affect  Mood:Anxious; Depressed  Affect:Depressed; Constricted   Thought Process  Thought Processes:Coherent  Descriptions of Associations:Circumstantial  Orientation:Full (Time, Place and Person)  Thought Content:Logical  History of Schizophrenia/Schizoaffective disorder:No  Duration of Psychotic Symptoms:Less than six months  Hallucinations:Hallucinations: None  Ideas of Reference:None  Suicidal Thoughts:Suicidal Thoughts: Yes, Passive  SI Passive Intent and/or Plan: Without Intent  Homicidal Thoughts:Homicidal Thoughts: No   Sensorium  Memory:Remote Good; Recent Good  Judgment:Fair  Insight:Fair   Executive Functions  Concentration:Fair  Attention Span:Fair  [place]   Psychomotor Activity  Psychomotor Activity:No data recorded  Assets  Assets:Communication Skills; Desire for Improvement; Housing   Sleep  Sleep:Sleep: Fair    Physical Exam: Physical Exam Vitals and nursing note reviewed.  Constitutional:      Appearance: Normal appearance. He is normal weight.  HENT:     Head: Normocephalic and atraumatic.     Right Ear: External ear normal.     Left Ear: External ear normal.     Nose: Nose normal.     Mouth/Throat:     Mouth: Mucous membranes are dry.     Pharynx: Oropharynx is clear.  Eyes:     Extraocular Movements: Extraocular movements intact.     Conjunctiva/sclera: Conjunctivae normal.     Pupils: Pupils are equal, round, and reactive to light.  Cardiovascular:     Rate and Rhythm: Normal rate and regular rhythm.  Pulmonary:     Effort: Pulmonary effort is normal.     Breath sounds: Normal breath sounds.  Abdominal:     General: Abdomen is flat. Bowel sounds are normal.     Palpations: Abdomen is soft.  Musculoskeletal:        General: Normal range of motion.     Cervical back: Normal range  of motion and neck supple.  Skin:    General: Skin is warm and dry.  Neurological:     General: No focal deficit present.     Mental Status: He is alert.    Review of Systems  Constitutional: Negative.   HENT: Negative.    Eyes: Negative.   Respiratory: Negative.    Cardiovascular: Negative.   Gastrointestinal: Negative.   Genitourinary: Negative.   Musculoskeletal: Negative.   Skin: Negative.  Negative for rash.  Neurological: Negative.   Endo/Heme/Allergies: Negative.   Psychiatric/Behavioral:  Positive for depression and suicidal ideas. The patient is nervous/anxious.    Blood pressure 116/79, pulse 81, temperature 98.4 F (36.9 C), resp. rate 20, height '5\' 8"'$  (1.727 m), weight 84.4 kg, SpO2 97 %. Body mass index is 28.28 kg/m.   Treatment Plan Summary: Daily contact with patient to assess and evaluate symptoms and progress in treatment, Medication management, and Plan Continue current meds and treatment.   Antonieta Pert 09/18/2021, 12:46 PM

## 2021-09-18 NOTE — Progress Notes (Signed)
Patient observed this morning in day area. Patient ate breakfast but still remains mostly isolative to room. Patient endorses passive SI but denies HI and A/V/H with no plan/intent. Patient med compliant with chronic back pain. Patient did not go outside or attended group. Patient stated he did not sleep too well last night and just wanted to rest.

## 2021-09-18 NOTE — [facility] Group Notes (Signed)
12:30PM Group: out door courtyard  Pt REFUSED

## 2021-09-18 NOTE — Progress Notes (Signed)
Patient presents with sad affect but brightens on approach. Endorses passive SI, with no intent no plan. Requests medication for anxiety and insomnia. Given with good relief. Isolative to self and room. Minimal interaction with staff, no interaction with peers.  Encouragement and support provided. Safety checks maintained. Medications given as prescribed. Pt receptive and remains safe on unit with q15 min checks.

## 2021-09-18 NOTE — [facility] Group Notes (Signed)
LCSW Wellness Group Note   09/18/2021 2:15pm  Type of Group and Topic: Psychoeducational Group:  Wellness  Participation Level:  did not attend  Description of Group  Wellness group introduces the topic and its focus on developing healthy habits across the spectrum and its relationship to a decrease in hospital admissions.  Six areas of wellness are discussed: physical, social spiritual, intellectual, occupational, and emotional.  Patients are asked to consider their current wellness habits and to identify areas of wellness where they are interested and able to focus on improvements.    Therapeutic Goals Patients will understand components of wellness and how they can positively impact overall health.  Patients will identify areas of wellness where they have developed good habits. Patients will identify areas of wellness where they would like to make improvements.    Summary of Patient Progress     Therapeutic Modalities: Cognitive Behavioral Therapy Psychoeducation    Joanne Chars, LCSW

## 2021-09-19 DIAGNOSIS — F332 Major depressive disorder, recurrent severe without psychotic features: Secondary | ICD-10-CM | POA: Diagnosis not present

## 2021-09-19 NOTE — BH IP Treatment Plan (Signed)
Interdisciplinary Treatment and Diagnostic Plan Update  09/19/2021 Time of Session: 9:30AM Casey Silva MRN: [id number]  Principal Diagnosis: Major depressive disorder, recurrent severe without psychotic features ([place])  Secondary Diagnoses: Principal Problem:   Major depressive disorder, recurrent severe without psychotic features ([place])   Current Medications:  Current Facility-Administered Medications  Medication Dose Route Frequency Provider Last Rate Last Admin   acetaminophen (TYLENOL) tablet 650 mg  650 mg Oral Q6H PRN Clapacs, [name], MD   650 mg at 09/15/21 0830   alum & mag hydroxide-simeth (MAALOX/MYLANTA) 200-200-20 MG/5ML suspension 30 mL  30 mL Oral Q4H PRN Clapacs, [name], MD       amLODipine (NORVASC) tablet 10 mg  10 mg Oral Daily Clapacs, [name], MD   10 mg at 09/19/21 0917   famotidine (PEPCID) tablet 20 mg  20 mg Oral Daily Clapacs, [name], MD   20 mg at 09/19/21 0916   hydrOXYzine (ATARAX) tablet 25 mg  25 mg Oral TID PRN Clapacs, [name], MD   25 mg at 09/17/21 2106   lidocaine (LIDODERM) 5 % 2 patch  2 patch Transdermal Q24H Clapacs, [name], MD   2 patch at 09/18/21 1258   lithium carbonate (ESKALITH) CR tablet 450 mg  450 mg Oral Q12H Clapacs, [name], MD   450 mg at 09/19/21 0918   magnesium hydroxide (MILK OF MAGNESIA) suspension 30 mL  30 mL Oral Daily PRN Clapacs, [name], MD   30 mL at 09/16/21 0933   methocarbamol (ROBAXIN) tablet 500 mg  500 mg Oral BID Clapacs, [name], MD   500 mg at 09/19/21 0916   mirtazapine (REMERON) tablet 45 mg  45 mg Oral QHS Clapacs, [name], MD   45 mg at 09/18/21 2120   polyethylene glycol (MIRALAX / GLYCOLAX) packet 17 g  17 g Oral Daily [name], NP   17 g at 09/19/21 0918   QUEtiapine (SEROQUEL) tablet 300 mg  300 mg Oral QHS Clapacs, [name], MD   300 mg at 09/18/21 2120   traZODone (DESYREL) tablet 50 mg  50 mg Oral QHS PRN Clapacs, [name], MD   50 mg at 09/18/21 2119   venlafaxine XR (EFFEXOR-XR) 24 hr capsule 150 mg  150 mg  Oral Q breakfast [name], NP   150 mg at 09/19/21 6010   PTA Medications: Medications Prior to Admission  Medication Sig Dispense Refill Last Dose   amLODipine (NORVASC) 10 MG tablet Take 1 tablet (10 mg total) by mouth daily.      famotidine (PEPCID) 20 MG tablet Take 1 tablet (20 mg total) by mouth daily.      hydrOXYzine (ATARAX) 25 MG tablet Take 1 tablet (25 mg total) by mouth 3 (three) times daily as needed for anxiety. 30 tablet 0    lidocaine (LIDODERM) 5 % Place 2 patches onto the skin daily. Remove & Discard patch within 12 hours or as directed by MD 30 patch 0    lithium carbonate (ESKALITH) 450 MG CR tablet Take 1 tablet (450 mg total) by mouth every 12 (twelve) hours.      [EXPIRED] methocarbamol (ROBAXIN) 500 MG tablet Take 1 tablet (500 mg total) by mouth 2 (two) times daily for 5 days. 10 tablet 0    mirtazapine (REMERON) 45 MG tablet Take 1 tablet (45 mg total) by mouth at bedtime.      QUEtiapine (SEROQUEL) 300 MG tablet Take 1 tablet (300 mg total) by mouth  at bedtime.      traZODone (DESYREL) 50 MG tablet Take 1 tablet (50 mg total) by mouth at bedtime as needed for sleep.      venlafaxine XR (EFFEXOR-XR) 75 MG 24 hr capsule Take 1 capsule (75 mg total) by mouth daily with breakfast.       Patient Stressors: Health problems    Patient Strengths: Capable of independent living   Treatment Modalities: Medication Management, Group therapy, Case management,  1 to 1 session with clinician, Psychoeducation, Recreational therapy.   Physician Treatment Plan for Primary Diagnosis: Major depressive disorder, recurrent severe without psychotic features ([place]) Long Term Goal(s): Improvement in symptoms so as ready for discharge   Short Term Goals: Ability to identify changes in lifestyle to reduce recurrence of condition will improve Ability to verbalize feelings will improve Ability to disclose and discuss suicidal ideas Ability to demonstrate self-control will  improve Ability to identify and develop effective coping behaviors will improve Ability to maintain clinical measurements within normal limits will improve  Medication Management: Evaluate patient's response, side effects, and tolerance of medication regimen.  Therapeutic Interventions: 1 to 1 sessions, Unit Group sessions and Medication administration.  Evaluation of Outcomes: Progressing  Physician Treatment Plan for Secondary Diagnosis: Principal Problem:   Major depressive disorder, recurrent severe without psychotic features ([place])  Long Term Goal(s): Improvement in symptoms so as ready for discharge   Short Term Goals: Ability to identify changes in lifestyle to reduce recurrence of condition will improve Ability to verbalize feelings will improve Ability to disclose and discuss suicidal ideas Ability to demonstrate self-control will improve Ability to identify and develop effective coping behaviors will improve Ability to maintain clinical measurements within normal limits will improve     Medication Management: Evaluate patient's response, side effects, and tolerance of medication regimen.  Therapeutic Interventions: 1 to 1 sessions, Unit Group sessions and Medication administration.  Evaluation of Outcomes: Progressing   RN Treatment Plan for Primary Diagnosis: Major depressive disorder, recurrent severe without psychotic features ([place]) Long Term Goal(s): Knowledge of disease and therapeutic regimen to maintain health will improve  Short Term Goals: Ability to remain free from injury will improve, Ability to verbalize frustration and anger appropriately will improve, Ability to demonstrate self-control, Ability to participate in decision making will improve, Ability to verbalize feelings will improve, Ability to disclose and discuss suicidal ideas, Ability to identify and develop effective coping behaviors will improve, and Compliance with prescribed medications will  improve  Medication Management: RN will administer medications as ordered by provider, will assess and evaluate patient's response and provide education to patient for prescribed medication. RN will report any adverse and/or side effects to prescribing provider.  Therapeutic Interventions: 1 on 1 counseling sessions, Psychoeducation, Medication administration, Evaluate responses to treatment, Monitor vital signs and CBGs as ordered, Perform/monitor CIWA, COWS, AIMS and Fall Risk screenings as ordered, Perform wound care treatments as ordered.  Evaluation of Outcomes: Progressing   LCSW Treatment Plan for Primary Diagnosis: Major depressive disorder, recurrent severe without psychotic features ([place]) Long Term Goal(s): Safe transition to appropriate next level of care at discharge, Engage patient in therapeutic group addressing interpersonal concerns.  Short Term Goals: Engage patient in aftercare planning with referrals and resources, Increase social support, Increase ability to appropriately verbalize feelings, Increase emotional regulation, Facilitate acceptance of mental health diagnosis and concerns, and Increase skills for wellness and recovery  Therapeutic Interventions: Assess for all discharge needs, 1 to 1 time with Social worker, Explore available resources and  support systems, Assess for adequacy in community support network, Educate family and significant other(s) on suicide prevention, Complete Psychosocial Assessment, Interpersonal group therapy.  Evaluation of Outcomes: Progressing   Progress in Treatment: Attending groups: No. Participating in groups: No. Taking medication as prescribed: Yes. Toleration medication: Yes. Family/Significant other contact made: No, will contact:  Pt declined permission for collateral contact. SPE completed with pt Patient understands diagnosis: Yes. Discussing patient identified problems/goals with staff: Yes. Medical problems stabilized or  resolved: Yes. Denies suicidal/homicidal ideation: Yes. Issues/concerns per patient self-inventory: No. Other: None  New problem(s) identified: No, Describe:  None  New Short Term/Long Term Goal(s): Patient to work towards medication management for mood stabilization; elimination of SI thoughts; development of comprehensive mental wellness plan. Update 09/19/21: No changes at this time.    Patient Goals: Per H&P, patient is interested in medication stabilization and obtaining an ACT team.  Update 09/19/21: No changes at this time.    Discharge Plan or Barriers: CSW will assist pt with development of appropriate discharge/aftercare plan. No psychosocial barriers identified with pt returning home. Update 09/19/21:  Patient has been referred to [facility]. Interview scheduled, acceptance for follow up pending.     Reason for Continuation of Hospitalization: Anxiety Depression Medication stabilization   Estimated Length of Stay: 1-7 days  Last 3 [place] Suicide Severity Risk Score: Flowsheet Row Admission (Current) from 09/13/2021 in [place] Most recent reading at 09/18/2021  9:48 AM ED [facility] 09/10/2021 in [facility] Most recent reading at 09/11/2021 12:32 AM ED from 09/10/2021 in [place] Most recent reading at 09/10/2021  5:49 PM  C-SSRS RISK CATEGORY No Risk Low Risk No Risk       Last PHQ 2/9 Scores:    09/11/2021    6:41 PM 06/07/2021    4:31 AM 03/30/2021    2:06 PM  Depression screen PHQ 2/9  Decreased Interest '3 3 3  '$ Down, Depressed, Hopeless '3 3 2  '$ PHQ - 2 Score '6 6 5  '$ Altered sleeping '3 2 3  '$ Tired, decreased energy '3 2 3  '$ Change in appetite '3 2 1  '$ Feeling bad or failure about yourself  '3 2 3  '$ Trouble concentrating '3 1 2  '$ Moving slowly or fidgety/restless '2 1 1  '$ Suicidal thoughts '3 3 2  '$ PHQ-9 Score '26 19 20  '$ Difficult doing work/chores Extremely dIfficult Extremely  dIfficult Very difficult    Scribe for Treatment Team: Marleny Faller A [place], [place] 09/19/2021 11:27 AM

## 2021-09-19 NOTE — [facility] Counselor (Signed)
CSW contacted [facility] to check in regarding if ACTT lead Richardson Landry, would be meeting with pt today.   CSW left message with staff member as there was no answer.   CSW will follow up again later today regarding meeting.   Wise Fees [place], MSW, LCSW-A 6/12/20231:37 PM

## 2021-09-19 NOTE — Progress Notes (Signed)
Patient is isolative to his room. He did not come out for vitals or snack. He is AAOx4. He has a flat affect and is minimal in interaction. He denies SI, HI, and AVH. He rated his anxiety and depression a 8/10. Pt stated the he does not feel he is progressing. Pt was encouraged to participate in group and milieu activities but he declined. He is safe on the unit at this time. Q 15 min safety checks in place.

## 2021-09-19 NOTE — Progress Notes (Cosign Needed)
[facility] MD Progress Note  09/19/2021 3:10 PM Casey Silva  MRN:  [id number]  Subjective:  He reports he has "steady low depression", no suicidal ideations.  He met with [facility] today and said, "The lady was very nice".  Anxiety is low, no panic attacks.  Fair sleep and appetite.  Denies side effects from his medications.  Discharge planned for tomorrow if coordination with his new [facility] team is possible.  Principal Problem: Major depressive disorder, recurrent severe without psychotic features ([place]) Diagnosis: Principal Problem:   Major depressive disorder, recurrent severe without psychotic features ([place])  Total Time spent with patient: 30 minutes  Past Psychiatric History: depression, anxiety  Past Medical History:  Past Medical History:  Diagnosis Date   Anxiety    Asthma    Depression    History reviewed. No pertinent surgical history.  Family History:  Family History  Problem Relation Age of Onset   Bipolar disorder Mother    Family Psychiatric  History: see above Social History:  Social History   Substance and Sexual Activity  Alcohol Use No     Social History   Substance and Sexual Activity  Drug Use No    Social History   Socioeconomic History   Marital status: Single    Spouse name: Not on file   Number of children: 1   Years of education: Not on file   Highest education level: GED or equivalent  Occupational History   Not on file  Tobacco Use   Smoking status: Never   Smokeless tobacco: Never  Vaping Use   Vaping Use: Unknown  Substance and Sexual Activity   Alcohol use: No   Drug use: No   Sexual activity: Not Currently  Other Topics Concern   Not on file  Social History Narrative   Marital status:  Single   Children: Daughter   Employment:  Unemployment. previous work for [employer].   Alcohol:  None   Drugs:  none   Exercise:  Regularly very   Religion:  Jewish   Social Determinants of Health   Financial Resource Strain: Medium Risk  (10/02/2019)   Overall Financial Resource Strain (CARDIA)    Difficulty of Paying Living Expenses: Somewhat hard  Food Insecurity: No Food Insecurity (10/02/2019)   Hunger Vital Sign    Worried About Running Out of Food in the Last Year: Never true    Ran Out of Food in the Last Year: Never true  Transportation Needs: No Transportation Needs (10/02/2019)   PRAPARE - Hydrologist (Medical): No    Lack of Transportation (Non-Medical): No  Physical Activity: Insufficiently Active (10/02/2019)   Exercise Vital Sign    Days of Exercise per Week: 2 days    Minutes of Exercise per Session: 30 min  Stress: Stress Concern Present (10/02/2019)   [place]    Feeling of Stress : Rather much  Social Connections: Socially Isolated (10/02/2019)   Social Connection and Isolation Panel [NHANES]    Frequency of Communication with Friends and Family: Never    Frequency of Social Gatherings with Friends and Family: Never    Attends Religious Services: Never    Marine scientist or Organizations: No    Attends Music therapist: Never    Marital Status: Divorced   Additional Social History: lives in a boarding house      Sleep: Good  Appetite:  Fair  Current  Medications: Current Facility-Administered Medications  Medication Dose Route Frequency Provider Last Rate Last Admin   acetaminophen (TYLENOL) tablet 650 mg  650 mg Oral Q6H PRN Clapacs, [name], MD   650 mg at 09/15/21 0830   alum & mag hydroxide-simeth (MAALOX/MYLANTA) 200-200-20 MG/5ML suspension 30 mL  30 mL Oral Q4H PRN Clapacs, [name], MD       amLODipine (NORVASC) tablet 10 mg  10 mg Oral Daily Clapacs, [name], MD   10 mg at 09/19/21 0917   famotidine (PEPCID) tablet 20 mg  20 mg Oral Daily Clapacs, [name], MD   20 mg at 09/19/21 0916   hydrOXYzine (ATARAX) tablet 25 mg  25 mg Oral TID PRN Clapacs, [name], MD   25 mg at 09/17/21  2106   lidocaine (LIDODERM) 5 % 2 patch  2 patch Transdermal Q24H Clapacs, [name], MD   2 patch at 09/19/21 1225   lithium carbonate (ESKALITH) CR tablet 450 mg  450 mg Oral Q12H Clapacs, [name], MD   450 mg at 09/19/21 0918   magnesium hydroxide (MILK OF MAGNESIA) suspension 30 mL  30 mL Oral Daily PRN Clapacs, [name], MD   30 mL at 09/16/21 0933   methocarbamol (ROBAXIN) tablet 500 mg  500 mg Oral BID Clapacs, [name], MD   500 mg at 09/19/21 0916   mirtazapine (REMERON) tablet 45 mg  45 mg Oral QHS Clapacs, [name], MD   45 mg at 09/18/21 2120   polyethylene glycol (MIRALAX / GLYCOLAX) packet 17 g  17 g Oral Daily [name], NP   17 g at 09/19/21 0918   QUEtiapine (SEROQUEL) tablet 300 mg  300 mg Oral QHS Clapacs, [name], MD   300 mg at 09/18/21 2120   traZODone (DESYREL) tablet 50 mg  50 mg Oral QHS PRN Clapacs, [name], MD   50 mg at 09/18/21 2119   venlafaxine XR (EFFEXOR-XR) 24 hr capsule 150 mg  150 mg Oral Q breakfast [name], NP   150 mg at 09/19/21 2633    Lab Results:  No results found for this or any previous visit (from the past 48 hour(s)).   Blood Alcohol level:  Lab Results  Component Value Date   ETH <10 06/07/2021   ETH <10 35/45/6256    Metabolic Disorder Labs: Lab Results  Component Value Date   HGBA1C 5.1 06/07/2021   MPG 99.67 06/07/2021   MPG 111.15 11/24/2020   No results found for: "PROLACTIN" Lab Results  Component Value Date   CHOL 197 06/07/2021   TRIG 149 06/07/2021   HDL 44 06/07/2021   CHOLHDL 4.5 06/07/2021   VLDL 30 06/07/2021   LDLCALC 123 (H) 06/07/2021   LDLCALC 148 (H) 11/24/2020     Musculoskeletal: Strength & Muscle Tone: within normal limits Gait & Station: normal Patient leans: N/A  Psychiatric Specialty Exam: Physical Exam Vitals and nursing note reviewed.  Constitutional:      Appearance: Normal appearance.  HENT:     Head: Normocephalic.     Nose: Nose normal.  Pulmonary:     Effort: Pulmonary effort is  normal.  Musculoskeletal:        General: Normal range of motion.     Cervical back: Normal range of motion.  Neurological:     General: No focal deficit present.     Mental Status: He is alert and oriented to person, place, and time.  Psychiatric:  Attention and Perception: Attention and perception normal.        Mood and Affect: Mood is anxious and depressed.        Speech: Speech normal.        Behavior: Behavior normal. Behavior is cooperative.        Thought Content: Thought content includes suicidal ideation.        Cognition and Memory: Cognition and memory normal.        Judgment: Judgment normal.     Review of Systems  Constitutional:  Positive for malaise/fatigue.  Musculoskeletal:  Positive for back pain.  Psychiatric/Behavioral:  Positive for depression. The patient is nervous/anxious.   All other systems reviewed and are negative.   Blood pressure 118/84, pulse 89, temperature 98 F (36.7 C), resp. rate 20, height _0  (1.727 m), weight 84.4 kg, SpO2 99 %.Body mass index is 28.28 kg/m.  General Appearance: Casual  Eye Contact:  Good  Speech:  Normal Rate  Volume:  Normal  Mood:  Anxious and Depressed  Affect:  Congruent  Thought Process:  Coherent  Orientation:  Full (Time, Place, and Person)  Thought Content:  Rumination  Suicidal Thoughts:  No  Homicidal Thoughts:  No  Memory:  Immediate;   Good Recent;   Good Remote;   Good  Judgement:  Fair  Insight:  Fair  Psychomotor Activity:  Decreased  Concentration:  Concentration: Good and Attention Span: Good  Recall:  [place] of Knowledge:  Good  Language:  Good  Akathisia:  No  Handed:  Right  AIMS (if indicated):     Assets:  Housing Leisure Time Physical Health Resilience  ADL's:  Intact  Cognition:  WNL  Sleep:         Physical Exam: Physical Exam Vitals and nursing note reviewed.  Constitutional:      Appearance: Normal appearance.  HENT:     Head: Normocephalic.     Nose:  Nose normal.  Pulmonary:     Effort: Pulmonary effort is normal.  Musculoskeletal:        General: Normal range of motion.     Cervical back: Normal range of motion.  Neurological:     General: No focal deficit present.     Mental Status: He is alert and oriented to person, place, and time.  Psychiatric:        Attention and Perception: Attention and perception normal.        Mood and Affect: Mood is anxious and depressed.        Speech: Speech normal.        Behavior: Behavior normal. Behavior is cooperative.        Thought Content: Thought content includes suicidal ideation.        Cognition and Memory: Cognition and memory normal.        Judgment: Judgment normal.    Review of Systems  Constitutional:  Positive for malaise/fatigue.  Musculoskeletal:  Positive for back pain.  Psychiatric/Behavioral:  Positive for depression. The patient is nervous/anxious.   All other systems reviewed and are negative.  Blood pressure 118/84, pulse 89, temperature 98 F (36.7 C), resp. rate 20, height _1  (1.727 m), weight 84.4 kg, SpO2 99 %. Body mass index is 28.28 kg/m.   Treatment Plan Summary: Daily contact with patient to assess and evaluate symptoms and progress in treatment, Medication management, and Plan : Major depressive disorder, recurrent, severe without psychosis: Continue Lithium 450 mg BID Continue Effexor 150 mg daily  Insomnia: Continue Trazodone 50 mg daily at bedtime PRN Continue Remeron 45 mg daily at bedtime   Anxiety: Continue hydroxyzine 25 mg TID PRN  Waylan Boga, NP 09/19/2021, 3:10 PM

## 2021-09-19 NOTE — Progress Notes (Signed)
Patient is alert and oriented times 4. Mood and affect blank and sullen. Patient rates pain as 4/10 to lower back. He denies SI, HI, and AVH. Expresses feelings feelings of anxiety and depression at this time. States he didn't sleep too good last night. Morning meds given whole by mouth W/O difficulty. Ate breakfast in day room- appetite good. Patient remains on unit with Q15 minute checks in place.

## 2021-09-20 DIAGNOSIS — F332 Major depressive disorder, recurrent severe without psychotic features: Secondary | ICD-10-CM | POA: Diagnosis not present

## 2021-09-20 NOTE — Progress Notes (Signed)
Recreation Therapy Notes  Date: 09/20/2021   Time: 1:40pm    Location: Craft room      Behavioral response: N/A   Intervention Topic: Goals     Discussion/Intervention: Patient refused to attend group.    Clinical Observations/Feedback:  Patient refused to attend group.    Anniemae Haberkorn LRT/CTRS        Macalister Arnaud 09/20/2021 3:03 PM

## 2021-09-20 NOTE — Progress Notes (Signed)
Recreation Therapy Notes  INPATIENT RECREATION TR PLAN  Patient Details Name: Keiran Gaffey MRN: [id number] DOB: 02-Dec-1955 Today's Date: 09/20/2021  Rec Therapy Plan Is patient appropriate for Therapeutic Recreation?: Yes Treatment times per week: at least 3 Estimated Length of Stay: 5-7 days TR Treatment/Interventions: Group participation (Comment)  Discharge Criteria Pt will be discharged from therapy if:: Discharged Treatment plan/goals/alternatives discussed and agreed upon by:: Patient/family  Discharge Summary Short term goals set: Patient will engage in groups without prompting or encouragement from LRT x3 group sessions within 5 recreation therapy group sessions Short term goals met: Adequate for discharge Progress toward goals comments: Groups attended Which groups?: Other (Comment) (Relaxation) Reason goals not met: Patient will engage in groups without prompting or encouragement from LRT x3 group sessions within 5 recreation therapy group sessions Therapeutic equipment acquired: N/A Reason patient discharged from therapy: Discharge from hospital Pt/family agrees with progress & goals achieved: Yes Date patient discharged from therapy: 09/20/21   Veronnica Hennings 09/20/2021, 3:40 PM

## 2021-09-20 NOTE — Plan of Care (Signed)
Patient is alert and oriented times 4. Mood and affect blank and sullen. Patient denies pain. He denies SI, HI, and AVH. Expresses feelings feelings of anxiety and depression at this time- PRN hydroxyzine given. States he didn't sleep too good last night. Morning meds given whole by mouth W/O difficulty. Ate lunch in day room- appetite good. Patient remains on unit with Q15 minute checks in place.     Problem: Education: Goal: Knowledge of [place] [facility] Education information/materials will improve Outcome: Progressing Goal: Emotional status will improve Outcome: Progressing Goal: Mental status will improve Outcome: Progressing Goal: Verbalization of understanding the information provided will improve Outcome: Progressing   Problem: Activity: Goal: Interest or engagement in activities will improve Outcome: Progressing Goal: Sleeping patterns will improve Outcome: Progressing   Problem: Coping: Goal: Ability to verbalize frustrations and anger appropriately will improve Outcome: Progressing Goal: Ability to demonstrate self-control will improve Outcome: Progressing   Problem: Health Behavior/Discharge Planning: Goal: Identification of resources available to assist in meeting health care needs will improve Outcome: Progressing Goal: Compliance with treatment plan for underlying cause of condition will improve Outcome: Progressing   Problem: Physical Regulation: Goal: Ability to maintain clinical measurements within normal limits will improve Outcome: Progressing   Problem: Safety: Goal: Periods of time without injury will increase Outcome: Progressing   Problem: Education: Goal: Utilization of techniques to improve thought processes will improve Outcome: Progressing Goal: Knowledge of the prescribed therapeutic regimen will improve Outcome: Progressing   Problem: Activity: Goal: Interest or engagement in leisure activities will improve Outcome: Progressing Goal:  Imbalance in normal sleep/wake cycle will improve Outcome: Progressing   Problem: Coping: Goal: Coping ability will improve Outcome: Progressing Goal: Will verbalize feelings Outcome: Progressing   Problem: Health Behavior/Discharge Planning: Goal: Ability to make decisions will improve Outcome: Progressing Goal: Compliance with therapeutic regimen will improve Outcome: Progressing   Problem: Role Relationship: Goal: Will demonstrate positive changes in social behaviors and relationships Outcome: Progressing   Problem: Safety: Goal: Ability to disclose and discuss suicidal ideas will improve Outcome: Progressing Goal: Ability to identify and utilize support systems that promote safety will improve Outcome: Progressing   Problem: Self-Concept: Goal: Will verbalize positive feelings about self Outcome: Progressing Goal: Level of anxiety will decrease Outcome: Progressing   Problem: Education: Goal: Knowledge of General Education information will improve Description: Including pain rating scale, medication(s)/side effects and non-pharmacologic comfort measures Outcome: Progressing   Problem: Health Behavior/Discharge Planning: Goal: Ability to manage health-related needs will improve Outcome: Progressing   Problem: Clinical Measurements: Goal: Ability to maintain clinical measurements within normal limits will improve Outcome: Progressing Goal: Will remain free from infection Outcome: Progressing Goal: Diagnostic test results will improve Outcome: Progressing Goal: Respiratory complications will improve Outcome: Progressing Goal: Cardiovascular complication will be avoided Outcome: Progressing   Problem: Activity: Goal: Risk for activity intolerance will decrease Outcome: Progressing   Problem: Nutrition: Goal: Adequate nutrition will be maintained Outcome: Progressing   Problem: Coping: Goal: Level of anxiety will decrease Outcome: Progressing   Problem:  Elimination: Goal: Will not experience complications related to bowel motility Outcome: Progressing Goal: Will not experience complications related to urinary retention Outcome: Progressing   Problem: Pain Managment: Goal: General experience of comfort will improve Outcome: Progressing   Problem: Safety: Goal: Ability to remain free from injury will improve Outcome: Progressing   Problem: Skin Integrity: Goal: Risk for impaired skin integrity will decrease Outcome: Progressing

## 2021-09-20 NOTE — Discharge Summary (Addendum)
Physician Discharge Summary Note  Patient:  Casey Silva is an 66 y.o., male MRN:  [id number] DOB:  Nov 28, 1955 Patient phone:  [phone] (home)  Patient address:   [address] [place] [address],  Total Time spent with patient: 45 minutes  Date of Admission:  09/13/2021 Date of Discharge: 09/20/21  Reason for Admission:  suicidal ideations  Principal Problem: Major depressive disorder, recurrent severe without psychotic features [facility]) Discharge Diagnoses: Principal Problem:   Major depressive disorder, recurrent severe without psychotic features ([place])   Past Psychiatric History: depression, anxiety  Past Medical History:  Past Medical History:  Diagnosis Date   Anxiety    Asthma    Depression    History reviewed. No pertinent surgical history. Family History:  Family History  Problem Relation Age of Onset   Bipolar disorder Mother    Family Psychiatric  History: see above Social History:  Social History   Substance and Sexual Activity  Alcohol Use No     Social History   Substance and Sexual Activity  Drug Use No    Social History   Socioeconomic History   Marital status: Single    Spouse name: Not on file   Number of children: 1   Years of education: Not on file   Highest education level: GED or equivalent  Occupational History   Not on file  Tobacco Use   Smoking status: Never   Smokeless tobacco: Never  Vaping Use   Vaping Use: Unknown  Substance and Sexual Activity   Alcohol use: No   Drug use: No   Sexual activity: Not Currently  Other Topics Concern   Not on file  Social History Narrative   Marital status:  Single   Children: Daughter   Employment:  Unemployment. previous work for [employer].   Alcohol:  None   Drugs:  none   Exercise:  Regularly very   Religion:  Jewish   Social Determinants of Health   Financial Resource Strain: Medium Risk (10/02/2019)   Overall Financial Resource Strain (CARDIA)    Difficulty of  Paying Living Expenses: Somewhat hard  Food Insecurity: No Food Insecurity (10/02/2019)   Hunger Vital Sign    Worried About Running Out of Food in the Last Year: Never true    Ran Out of Food in the Last Year: Never true  Transportation Needs: No Transportation Needs (10/02/2019)   PRAPARE - Hydrologist (Medical): No    Lack of Transportation (Non-Medical): No  Physical Activity: Insufficiently Active (10/02/2019)   Exercise Vital Sign    Days of Exercise per Week: 2 days    Minutes of Exercise per Session: 30 min  Stress: Stress Concern Present (10/02/2019)   [place]    Feeling of Stress : Rather much  Social Connections: Socially Isolated (10/02/2019)   Social Connection and Isolation Panel [NHANES]    Frequency of Communication with Friends and Family: Never    Frequency of Social Gatherings with Friends and Family: Never    Attends Religious Services: Never    Marine scientist or Organizations: No    Attends Archivist Meetings: Never    Marital Status: Divorced    Hospital Course:   On admission 09/14/21: 66 yo male with a long history of depression, admitted for increase in suicidal ideations.  His baseline is chronic suicidal ideations.  He was recently released after a long  stay inpatient with ECT.  Returned to the ED reporting he did not take his medications for the past 5 days as he was confused from ECT.  The plan is to get his medications back to his baseline, seek an ACT team, and return to his previous living environment.   He has been 3 days at the [facility] and reports his depression is still high with chronic suicidal ideations, no changes or plans.  His anxiety has improved, "little better, mild", no panic attacks.  His sleep is "fair", appetite is "good".  Denies trauma, hallucinations, substance abuse, and homicidal ideations.  Discussed the plan of care with him of  restarting and adjusting his medications with return home goal of early next week with the hope to have an ACT team in place to assist with his care in the community since he has had multiple admissions and could greatly benefit from having one.  Medications:  Lithium 450 mg BID, Remeron 45 mg daily at bedtime, Effexor 75 mg daily, Seroquel 300 mg daily, Trazodone 50 mg daily, hydroxyzine 25 mg TID PRN  6/8: "I'm a little better."  He reports he did get up this morning and plans to shower later.  Complains of back pain and did take Tylenol earlier, currently resting on his bed.  High depression with chronic suicidal ideations at baseline.  Minimal anxiety.  Discussed discharge planning and what his needs were.  He feels just waiting "for the meds to kick in".  Exploring an ACT team to assist him after discharge, projected discharge date of Monday since he was at the [facility] for a few days already.  6/9:  Follow-up patient with severe depression.  Patient tells me he continues to be very depressed down low energy negative mood and feeling hopeless.  Suicidal ideation is present without a specific plan.  Denies acute psychotic symptoms.  Complains of constipation probably related in part to being dehydrated.  6/10:  Patient awake observed in dayroom this morning eating breakfast and med compliant. Patient denies HI and A/V/H with no plan/intent but endorses passive SI. Patient rates his depression a 8 out of 10. Patient remains mostly in room wanting to rest. Verbalizes no further concerns at this time.   6/11: Patient presents with sad affect but brightens on approach. Endorses passive SI, with no intent no plan. Requests medication for anxiety and insomnia. Given with good relief. Isolative to self and room. Minimal interaction with staff, no interaction with peers.   Patient remains isolative and depressed. He rates his depression 25-Nov-2022 and has passive death wish. He is compliant with all of his meds and  is sleeping well.   6/12: He reports he has "steady low depression", no suicidal ideations.  He met with [facility] today and said, "The lady was very nice".  Anxiety is low, no panic attacks.  Fair sleep and appetite.  Denies side effects from his medications.  Discharge planned for tomorrow if coordination with his new ACT team is possible  6/13: Patient has met maximum benefit of hospitalization.  Denies suicidal/homicidal ideations, hallucinations, and withdrawal symptoms.  Discharge instructions provided with explanations along with crisis numbers, Rx, and appointment information.  Musculoskeletal: Strength & Muscle Tone: within normal limits Gait & Station: normal Patient leans: N/A   Psychiatric Specialty Exam: Physical Exam Vitals and nursing note reviewed.  Constitutional:      Appearance: Normal appearance.  HENT:     Head: Normocephalic.     Nose: Nose normal.  Pulmonary:     Effort: Pulmonary effort is normal.  Musculoskeletal:        General: Normal range of motion.     Cervical back: Normal range of motion.  Neurological:     General: No focal deficit present.     Mental Status: He is alert and oriented to person, place, and time.  Psychiatric:        Attention and Perception: Attention and perception normal.        Mood and Affect: Mood is anxious and depressed.        Speech: Speech normal.        Behavior: Behavior normal. Behavior is cooperative.        Thought Content: Thought content normal.        Cognition and Memory: Cognition and memory normal.        Judgment: Judgment normal.     Review of Systems  Musculoskeletal:  Positive for back pain.  Psychiatric/Behavioral:  Positive for depression. The patient is nervous/anxious.   All other systems reviewed and are negative.   Blood pressure 120/79, pulse 87, temperature 98.6 F (37 C), temperature source Oral, resp. rate 18, height _0  (1.727 m), weight 84.4 kg, SpO2 97 %.Body mass index is 28.28  kg/m.  General Appearance: Casual  Eye Contact:  Good  Speech:  Normal Rate  Volume:  Normal  Mood:  Anxious and Depressed  Affect:  Congruent  Thought Process:  Coherent and Descriptions of Associations: Intact  Orientation:  Full (Time, Place, and Person)  Thought Content:  WDL and Logical  Suicidal Thoughts:  No  Homicidal Thoughts:  No  Memory:  Immediate;   Good Recent;   Good Remote;   Good  Judgement:  Fair  Insight:  Fair  Psychomotor Activity:  Normal  Concentration:  Concentration: Good and Attention Span: Good  Recall:  [place] of Knowledge:  Good  Language:  Good  Akathisia:  No  Handed:  Right  AIMS (if indicated):     Assets:  Housing Leisure Time Physical Health Resilience  ADL's:  Intact  Cognition:  WNL  Sleep:         Physical Exam: Physical Exam Vitals and nursing note reviewed.  Constitutional:      Appearance: Normal appearance.  HENT:     Head: Normocephalic.     Nose: Nose normal.  Pulmonary:     Effort: Pulmonary effort is normal.  Musculoskeletal:        General: Normal range of motion.     Cervical back: Normal range of motion.  Neurological:     General: No focal deficit present.     Mental Status: He is alert and oriented to person, place, and time.  Psychiatric:        Attention and Perception: Attention and perception normal.        Mood and Affect: Mood is anxious and depressed.        Speech: Speech normal.        Behavior: Behavior normal. Behavior is cooperative.        Thought Content: Thought content normal.        Cognition and Memory: Cognition and memory normal.        Judgment: Judgment normal.    Review of Systems  Musculoskeletal:  Positive for back pain.  Psychiatric/Behavioral:  Positive for depression. The patient is nervous/anxious.   All other systems reviewed and are negative.  Blood pressure 120/79, pulse 87, temperature  98.6 F (37 C), temperature source Oral, resp. rate 18, height _0  (1.727  m), weight 84.4 kg, SpO2 97 %. Body mass index is 28.28 kg/m.   Social History   Tobacco Use  Smoking Status Never  Smokeless Tobacco Never   Tobacco Cessation:  N/A, patient does not currently use tobacco products   Blood Alcohol level:  Lab Results  Component Value Date   ETH <10 06/07/2021   ETH <10 18/56/3149    Metabolic Disorder Labs:  Lab Results  Component Value Date   HGBA1C 5.1 06/07/2021   MPG 99.67 06/07/2021   MPG 111.15 11/24/2020   No results found for: "PROLACTIN" Lab Results  Component Value Date   CHOL 197 06/07/2021   TRIG 149 06/07/2021   HDL 44 06/07/2021   CHOLHDL 4.5 06/07/2021   VLDL 30 06/07/2021   LDLCALC 123 (H) 06/07/2021   LDLCALC 148 (H) 11/24/2020    See Psychiatric Specialty Exam and Suicide Risk Assessment completed by Attending Physician prior to discharge.  Discharge destination:  Home  Is patient on multiple antipsychotic therapies at discharge:  No   Has Patient had three or more failed trials of antipsychotic monotherapy by history:  No  Recommended Plan for Multiple Antipsychotic Therapies: NA  Discharge Instructions     Diet - low sodium heart healthy   Complete by: As directed    Discharge instructions   Complete by: As directed    Follow up with [facility]   Increase activity slowly   Complete by: As directed       Allergies as of 09/20/2021       Reactions   Dust Mite Mixed Allergen Ext [mite (d. Farinae)] Shortness Of Breath   Bee Pollen Other (See Comments)   Watery Eyes        Medication List     STOP taking these medications    methocarbamol 500 MG tablet Commonly known as: ROBAXIN       TAKE these medications      Indication  amLODipine 10 MG tablet Commonly known as: NORVASC Take 1 tablet (10 mg total) by mouth daily.    famotidine 20 MG tablet Commonly known as: PEPCID Take 1 tablet (20 mg total) by mouth daily.    hydrOXYzine 25 MG tablet Commonly known as:  ATARAX Take 1 tablet (25 mg total) by mouth 3 (three) times daily as needed for anxiety.    lidocaine 5 % Commonly known as: LIDODERM Place 2 patches onto the skin daily. Remove & Discard patch within 12 hours or as directed by MD    lithium carbonate 450 MG CR tablet Commonly known as: ESKALITH Take 1 tablet (450 mg total) by mouth every 12 (twelve) hours.    mirtazapine 45 MG tablet Commonly known as: REMERON Take 1 tablet (45 mg total) by mouth at bedtime.    QUEtiapine 300 MG tablet Commonly known as: SEROQUEL Take 1 tablet (300 mg total) by mouth at bedtime.    traZODone 50 MG tablet Commonly known as: DESYREL Take 1 tablet (50 mg total) by mouth at bedtime as needed for sleep.    venlafaxine XR 75 MG 24 hr capsule Commonly known as: EFFEXOR-XR Take 1 capsule (75 mg total) by mouth daily with breakfast.         Follow-up Information     [facility] Follow up.   Why: Your referral has been accepted. Your hospital follow up is scheduled with [facility] team member tomorrow,  Wednesday, June 14th between 1-3pm at your home. Contact information: [place] [address] [phone]                 Follow-up recommendations:  Activity:  as tolerated Diet:  heart healthy diet Major depressive disorder, recurrent, severe without psychosis: Continue Lithium 450 mg BID Continue Effexor 150 mg daily Follow up with the ACT team   Insomnia: Continue Trazodone 50 mg daily at bedtime PRN Continue Remeron 45 mg daily at bedtime   Anxiety: Continue hydroxyzine 25 mg TID PRN  Comments:  follow up with [facility] team  Signed: Waylan Boga, NP 09/20/2021, 12:26 PM

## 2021-09-20 NOTE — Plan of Care (Signed)
Problem: Education: Goal: Knowledge of [facility] Education information/materials will improve 09/20/2021 1228 by Nolon Bussing, RN Outcome: Adequate for Discharge 09/20/2021 1126 by Nolon Bussing, RN Outcome: Progressing Goal: Emotional status will improve 09/20/2021 1228 by Nolon Bussing, RN Outcome: Adequate for Discharge 09/20/2021 1126 by Nolon Bussing, RN Outcome: Progressing Goal: Mental status will improve 09/20/2021 1228 by Nolon Bussing, RN Outcome: Adequate for Discharge 09/20/2021 1126 by Nolon Bussing, RN Outcome: Progressing Goal: Verbalization of understanding the information provided will improve 09/20/2021 1228 by Nolon Bussing, RN Outcome: Adequate for Discharge 09/20/2021 1126 by Nolon Bussing, RN Outcome: Progressing   Problem: Activity: Goal: Interest or engagement in activities will improve 09/20/2021 1228 by Nolon Bussing, RN Outcome: Adequate for Discharge 09/20/2021 1126 by Nolon Bussing, RN Outcome: Progressing Goal: Sleeping patterns will improve 09/20/2021 1228 by Nolon Bussing, RN Outcome: Adequate for Discharge 09/20/2021 1126 by Nolon Bussing, RN Outcome: Progressing   Problem: Coping: Goal: Ability to verbalize frustrations and anger appropriately will improve 09/20/2021 1228 by Nolon Bussing, RN Outcome: Adequate for Discharge 09/20/2021 1126 by Nolon Bussing, RN Outcome: Progressing Goal: Ability to demonstrate self-control will improve 09/20/2021 1228 by Nolon Bussing, RN Outcome: Adequate for Discharge 09/20/2021 1126 by Nolon Bussing, RN Outcome: Progressing   Problem: Health Behavior/Discharge Planning: Goal: Identification of resources available to assist in meeting health care needs will improve 09/20/2021 1228 by Nolon Bussing, RN Outcome: Adequate for Discharge 09/20/2021 1126 by Nolon Bussing, RN Outcome: Progressing Goal: Compliance with  treatment plan for underlying cause of condition will improve 09/20/2021 1228 by Nolon Bussing, RN Outcome: Adequate for Discharge 09/20/2021 1126 by Nolon Bussing, RN Outcome: Progressing   Problem: Physical Regulation: Goal: Ability to maintain clinical measurements within normal limits will improve 09/20/2021 1228 by Nolon Bussing, RN Outcome: Adequate for Discharge 09/20/2021 1126 by Nolon Bussing, RN Outcome: Progressing   Problem: Safety: Goal: Periods of time without injury will increase 09/20/2021 1228 by Nolon Bussing, RN Outcome: Adequate for Discharge 09/20/2021 1126 by Nolon Bussing, RN Outcome: Progressing   Problem: Education: Goal: Utilization of techniques to improve thought processes will improve 09/20/2021 1228 by Nolon Bussing, RN Outcome: Adequate for Discharge 09/20/2021 1126 by Nolon Bussing, RN Outcome: Progressing Goal: Knowledge of the prescribed therapeutic regimen will improve 09/20/2021 1228 by Nolon Bussing, RN Outcome: Adequate for Discharge 09/20/2021 1126 by Nolon Bussing, RN Outcome: Progressing   Problem: Activity: Goal: Interest or engagement in leisure activities will improve 09/20/2021 1228 by Nolon Bussing, RN Outcome: Adequate for Discharge 09/20/2021 1126 by Nolon Bussing, RN Outcome: Progressing Goal: Imbalance in normal sleep/wake cycle will improve 09/20/2021 1228 by Nolon Bussing, RN Outcome: Adequate for Discharge 09/20/2021 1126 by Nolon Bussing, RN Outcome: Progressing   Problem: Coping: Goal: Coping ability will improve 09/20/2021 1228 by Nolon Bussing, RN Outcome: Adequate for Discharge 09/20/2021 1126 by Nolon Bussing, RN Outcome: Progressing Goal: Will verbalize feelings 09/20/2021 1228 by Nolon Bussing, RN Outcome: Adequate for Discharge 09/20/2021 1126 by Nolon Bussing, RN Outcome: Progressing   Problem: Health Behavior/Discharge  Planning: Goal: Ability to make decisions will improve 09/20/2021 1228 by Nolon Bussing, RN Outcome: Adequate for Discharge 09/20/2021 1126 by Nolon Bussing, RN Outcome: Progressing Goal: Compliance with therapeutic regimen will improve 09/20/2021 1228 by Nolon Bussing, RN Outcome: Adequate for Discharge 09/20/2021  1126 by Nolon Bussing, RN Outcome: Progressing   Problem: Role Relationship: Goal: Will demonstrate positive changes in social behaviors and relationships 09/20/2021 1228 by Nolon Bussing, RN Outcome: Adequate for Discharge 09/20/2021 1126 by Nolon Bussing, RN Outcome: Progressing   Problem: Safety: Goal: Ability to disclose and discuss suicidal ideas will improve 09/20/2021 1228 by Nolon Bussing, RN Outcome: Adequate for Discharge 09/20/2021 1126 by Nolon Bussing, RN Outcome: Progressing Goal: Ability to identify and utilize support systems that promote safety will improve 09/20/2021 1228 by Nolon Bussing, RN Outcome: Adequate for Discharge 09/20/2021 1126 by Nolon Bussing, RN Outcome: Progressing   Problem: Self-Concept: Goal: Will verbalize positive feelings about self 09/20/2021 1228 by Nolon Bussing, RN Outcome: Adequate for Discharge 09/20/2021 1126 by Nolon Bussing, RN Outcome: Progressing Goal: Level of anxiety will decrease 09/20/2021 1228 by Nolon Bussing, RN Outcome: Adequate for Discharge 09/20/2021 1126 by Nolon Bussing, RN Outcome: Progressing   Problem: Education: Goal: Knowledge of General Education information will improve Description: Including pain rating scale, medication(s)/side effects and non-pharmacologic comfort measures 09/20/2021 1228 by Nolon Bussing, RN Outcome: Adequate for Discharge 09/20/2021 1126 by Nolon Bussing, RN Outcome: Progressing   Problem: Health Behavior/Discharge Planning: Goal: Ability to manage health-related needs will improve 09/20/2021 1228 by  Nolon Bussing, RN Outcome: Adequate for Discharge 09/20/2021 1126 by Nolon Bussing, RN Outcome: Progressing   Problem: Clinical Measurements: Goal: Ability to maintain clinical measurements within normal limits will improve 09/20/2021 1228 by Nolon Bussing, RN Outcome: Adequate for Discharge 09/20/2021 1126 by Nolon Bussing, RN Outcome: Progressing Goal: Will remain free from infection 09/20/2021 1228 by Nolon Bussing, RN Outcome: Adequate for Discharge 09/20/2021 1126 by Nolon Bussing, RN Outcome: Progressing Goal: Diagnostic test results will improve 09/20/2021 1228 by Nolon Bussing, RN Outcome: Adequate for Discharge 09/20/2021 1126 by Nolon Bussing, RN Outcome: Progressing Goal: Respiratory complications will improve 09/20/2021 1228 by Nolon Bussing, RN Outcome: Adequate for Discharge 09/20/2021 1126 by Nolon Bussing, RN Outcome: Progressing Goal: Cardiovascular complication will be avoided 09/20/2021 1228 by Nolon Bussing, RN Outcome: Adequate for Discharge 09/20/2021 1126 by Nolon Bussing, RN Outcome: Progressing   Problem: Activity: Goal: Risk for activity intolerance will decrease 09/20/2021 1228 by Nolon Bussing, RN Outcome: Adequate for Discharge 09/20/2021 1126 by Nolon Bussing, RN Outcome: Progressing   Problem: Nutrition: Goal: Adequate nutrition will be maintained 09/20/2021 1228 by Nolon Bussing, RN Outcome: Adequate for Discharge 09/20/2021 1126 by Nolon Bussing, RN Outcome: Progressing   Problem: Coping: Goal: Level of anxiety will decrease 09/20/2021 1228 by Nolon Bussing, RN Outcome: Adequate for Discharge 09/20/2021 1126 by Nolon Bussing, RN Outcome: Progressing   Problem: Elimination: Goal: Will not experience complications related to bowel motility 09/20/2021 1228 by Nolon Bussing, RN Outcome: Adequate for Discharge 09/20/2021 1126 by Nolon Bussing,  RN Outcome: Progressing Goal: Will not experience complications related to urinary retention 09/20/2021 1228 by Nolon Bussing, RN Outcome: Adequate for Discharge 09/20/2021 1126 by Nolon Bussing, RN Outcome: Progressing   Problem: Pain Managment: Goal: General experience of comfort will improve 09/20/2021 1228 by Nolon Bussing, RN Outcome: Adequate for Discharge 09/20/2021 1126 by Nolon Bussing, RN Outcome: Progressing   Problem: Safety: Goal: Ability to remain free from injury will improve 09/20/2021 1228 by Nolon Bussing, RN Outcome: Adequate for Discharge 09/20/2021 1126 by Nolon Bussing,  RN Outcome: Progressing   Problem: Skin Integrity: Goal: Risk for impaired skin integrity will decrease 09/20/2021 1228 by Nolon Bussing, RN Outcome: Adequate for Discharge 09/20/2021 1126 by Nolon Bussing, RN Outcome: Progressing

## 2021-09-20 NOTE — Progress Notes (Signed)

## 2021-09-20 NOTE — Plan of Care (Signed)
  Problem: Group Participation Goal: STG - Patient will engage in groups without prompting or encouragement from LRT x3 group sessions within 5 recreation therapy group sessions Description: STG - Patient will engage in groups without prompting or encouragement from LRT x3 group sessions within 5 recreation therapy group sessions 09/20/2021 1535 by Ernest Haber, LRT Outcome: Adequate for Discharge 09/20/2021 1535 by Ernest Haber, LRT Outcome: Adequate for Discharge

## 2021-09-20 NOTE — Progress Notes (Signed)
Patient is seen in his bedroom at the beginning of the shift. He is isolative to his room and does not participate in milieu activities. He has a flat affect. He is AAOx4. He denies SI, HI, and AVH. He rates his anxiety and depression a 8/10. Pt needs a podiatry consult to cut overgrown toenails. He is safe on the unit at this time. Q 15 min safety checks in place.

## 2021-09-20 NOTE — BHH Suicide Risk Assessment (Signed)
[facility] Discharge Suicide Risk Assessment   Principal Problem: Major depressive disorder, recurrent severe without psychotic features [facility]) Discharge Diagnoses: Principal Problem:   Major depressive disorder, recurrent severe without psychotic features ([place])   Total Time spent with patient: 45 minutes  Musculoskeletal: Strength & Muscle Tone: within normal limits Gait & Station: normal Patient leans: N/A  Psychiatric Specialty Exam: Physical Exam Vitals and nursing note reviewed.  Constitutional:      Appearance: Normal appearance.  HENT:     Head: Normocephalic.     Nose: Nose normal.  Pulmonary:     Effort: Pulmonary effort is normal.  Musculoskeletal:        General: Normal range of motion.     Cervical back: Normal range of motion.  Neurological:     General: No focal deficit present.     Mental Status: He is alert and oriented to person, place, and time.  Psychiatric:        Attention and Perception: Attention and perception normal.        Mood and Affect: Mood is anxious and depressed.        Speech: Speech normal.        Behavior: Behavior normal. Behavior is cooperative.        Thought Content: Thought content normal.        Cognition and Memory: Cognition and memory normal.        Judgment: Judgment normal.     Review of Systems  Musculoskeletal:  Positive for back pain.  Psychiatric/Behavioral:  Positive for depression. The patient is nervous/anxious.   All other systems reviewed and are negative.   Blood pressure 120/79, pulse 87, temperature 98.6 F (37 C), temperature source Oral, resp. rate 18, height '5\' 8"'$  (1.727 m), weight 84.4 kg, SpO2 97 %.Body mass index is 28.28 kg/m.  General Appearance: Casual  Eye Contact:  Good  Speech:  Normal Rate  Volume:  Normal  Mood:  Anxious and Depressed  Affect:  Blunt  Thought Process:  Coherent and Descriptions of Associations: Intact  Orientation:  Full (Time, Place, and Person)  Thought Content:  WDL and  Logical  Suicidal Thoughts:  No  Homicidal Thoughts:  No  Memory:  Immediate;   Good Recent;   Fair Remote;   Fair  Judgement:  Fair  Insight:  Fair  Psychomotor Activity:  Normal  Concentration:  Concentration: Good and Attention Span: Good  Recall:  Good  Fund of Knowledge:  Good  Language:  Good  Akathisia:  No  Handed:  Right  AIMS (if indicated):     Assets:  Housing Leisure Time Physical Health Resilience  ADL's:  Intact  Cognition:  WNL  Sleep:        Physical Exam: Physical Exam Vitals and nursing note reviewed.  Constitutional:      Appearance: Normal appearance.  HENT:     Head: Normocephalic.     Nose: Nose normal.  Pulmonary:     Effort: Pulmonary effort is normal.  Musculoskeletal:        General: Normal range of motion.     Cervical back: Normal range of motion.  Neurological:     General: No focal deficit present.     Mental Status: He is alert and oriented to person, place, and time.  Psychiatric:        Attention and Perception: Attention and perception normal.        Mood and Affect: Mood is anxious and depressed.  Speech: Speech normal.        Behavior: Behavior normal. Behavior is cooperative.        Thought Content: Thought content normal.        Cognition and Memory: Cognition and memory normal.        Judgment: Judgment normal.    Review of Systems  Musculoskeletal:  Positive for back pain.  Psychiatric/Behavioral:  Positive for depression. The patient is nervous/anxious.   All other systems reviewed and are negative.  Blood pressure 120/79, pulse 87, temperature 98.6 F (37 C), temperature source Oral, resp. rate 18, height '5\' 8"'$  (1.727 m), weight 84.4 kg, SpO2 97 %. Body mass index is 28.28 kg/m.  Mental Status Per Nursing Assessment::   On Admission:  NA  Demographic Factors:  Male, Caucasian, and Living alone  Loss Factors: NA  Historical Factors: NA  Risk Reduction Factors:   Positive coping skills or problem  solving skills  Continued Clinical Symptoms:  Depression, moderate; anxiety, mild  Cognitive Features That Contribute To Risk:  None    Suicide Risk:  Minimal: No identifiable suicidal ideation.  Patients presenting with no risk factors but with morbid ruminations; may be classified as minimal risk based on the severity of the depressive symptoms   Follow-up Information     [facility] Follow up.   Why: Your referral has been accepted. Your hospital follow up is scheduled with [facility] team member tomorrow, Wednesday, June 14th between 1-3pm at your home. Contact information: [address] [address] [phone]                 Plan Of Care/Follow-up recommendations:  Activity:  as tolerated Diet:  heart healthy diet Major depressive disorder, recurrent, severe without psychosis: Continue Lithium 450 mg BID Continue Effexor 150 mg daily Follow up with the ACT team   Insomnia: Continue Trazodone 50 mg daily at bedtime PRN Continue Remeron 45 mg daily at bedtime   Anxiety: Continue hydroxyzine 25 mg TID PRN  Waylan Boga, NP 09/20/2021, 12:21 PM

## 2021-09-28 ENCOUNTER — Telehealth (HOSPITAL_COMMUNITY): Payer: Self-pay | Admitting: Medical

## 2021-09-28 NOTE — BH Assessment (Signed)
Care Management - Follow Up [facility] Discharges   Patient has been placed in an inpatient psychiatric hospital ([facility]) non 09-13-2021.

## 2021-10-20 ENCOUNTER — Emergency Department (HOSPITAL_COMMUNITY): Payer: Self-pay

## 2021-10-20 ENCOUNTER — Encounter (HOSPITAL_COMMUNITY): Payer: Self-pay | Admitting: Emergency Medicine

## 2021-10-20 ENCOUNTER — Observation Stay (HOSPITAL_COMMUNITY)
Admission: EM | Admit: 2021-10-20 | Discharge: 2021-10-24 | Disposition: A | Discharge status: Home / Self Care | Payer: Self-pay | Attending: Internal Medicine | Admitting: Internal Medicine

## 2021-10-20 DIAGNOSIS — I10 Essential (primary) hypertension: Secondary | ICD-10-CM | POA: Insufficient documentation

## 2021-10-20 DIAGNOSIS — F418 Other specified anxiety disorders: Secondary | ICD-10-CM | POA: Diagnosis present

## 2021-10-20 DIAGNOSIS — T56891A Toxic effect of other metals, accidental (unintentional), initial encounter: Secondary | ICD-10-CM | POA: Insufficient documentation

## 2021-10-20 DIAGNOSIS — Z79899 Other long term (current) drug therapy: Secondary | ICD-10-CM | POA: Insufficient documentation

## 2021-10-20 DIAGNOSIS — J45909 Unspecified asthma, uncomplicated: Secondary | ICD-10-CM | POA: Insufficient documentation

## 2021-10-20 DIAGNOSIS — E538 Deficiency of other specified B group vitamins: Secondary | ICD-10-CM | POA: Insufficient documentation

## 2021-10-20 DIAGNOSIS — R296 Repeated falls: Principal | ICD-10-CM | POA: Insufficient documentation

## 2021-10-20 LAB — RAPID URINE DRUG SCREEN, HOSP PERFORMED
Amphetamines: NOT DETECTED
Barbiturates: NOT DETECTED
Benzodiazepines: NOT DETECTED
Cocaine: NOT DETECTED
Opiates: NOT DETECTED
Tetrahydrocannabinol: NOT DETECTED

## 2021-10-20 LAB — BASIC METABOLIC PANEL
Anion gap: 8 (ref 5–15)
BUN: 12 mg/dL (ref 8–23)
CO2: 23 mmol/L (ref 22–32)
Calcium: 9.3 mg/dL (ref 8.9–10.3)
Chloride: 105 mmol/L (ref 98–111)
Creatinine, Ser: 1.32 mg/dL — ABNORMAL HIGH (ref 0.61–1.24)
GFR, Estimated: >60 mL/min (ref >60)
Glucose, Bld: 98 mg/dL (ref 70–99)
Potassium: 3.5 mmol/L (ref 3.5–5.1)
Sodium: 136 mmol/L (ref 135–145)

## 2021-10-20 LAB — CBC
HCT: 44.7 % (ref 39.0–52.0)
Hemoglobin: 15 g/dL (ref 13.0–17.0)
MCH: 29.5 pg (ref 26.0–34.0)
MCHC: 33.6 g/dL (ref 30.0–36.0)
MCV: 88 fL (ref 80.0–100.0)
Platelets: 304 K/uL (ref 150–400)
RBC: 5.08 MIL/uL (ref 4.22–5.81)
RDW: 13.6 % (ref 11.5–15.5)
WBC: 8.5 K/uL (ref 4.0–10.5)
nRBC: 0 % (ref 0.0–0.2)

## 2021-10-20 LAB — URINALYSIS, ROUTINE W REFLEX MICROSCOPIC
Bacteria, UA: NONE SEEN
Bilirubin Urine: NEGATIVE
Glucose, UA: NEGATIVE mg/dL
Ketones, ur: 5 mg/dL — AB
Leukocytes,Ua: NEGATIVE
Nitrite: NEGATIVE
Protein, ur: NEGATIVE mg/dL
Specific Gravity, Urine: 1.018 (ref 1.005–1.030)
pH: 6 (ref 5.0–8.0)

## 2021-10-20 LAB — CBG MONITORING, ED: Glucose-Capillary: 113 mg/dL — ABNORMAL HIGH (ref 70–99)

## 2021-10-20 LAB — ETHANOL: Alcohol, Ethyl (B): <10 mg/dL (ref <10)

## 2021-10-20 LAB — LITHIUM LEVEL: Lithium Lvl: 1.32 mmol/L — ABNORMAL HIGH (ref 0.60–1.20)

## 2021-10-20 LAB — VITAMIN B12: Vitamin B-12: 204 pg/mL (ref 180–914)

## 2021-10-20 LAB — AMMONIA: Ammonia: 27 umol/L (ref 9–35)

## 2021-10-20 LAB — TSH: TSH: 2.314 u[IU]/mL (ref 0.350–4.500)

## 2021-10-20 MED ORDER — SODIUM CHLORIDE 0.9 % IV SOLN: 1000.0000 mL | INTRAVENOUS | Status: DC

## 2021-10-20 MED ORDER — SODIUM CHLORIDE 0.9% FLUSH
3.0000 mL | Freq: Two times a day (BID) | INTRAVENOUS | Status: DC
  Administered 2021-10-20 – 2021-10-23 (×7): 3 mL via INTRAVENOUS

## 2021-10-20 MED ORDER — SERTRALINE HCL 50 MG PO TABS
50.0000 mg | ORAL_TABLET | Freq: Every day | ORAL | Status: DC
  Administered 2021-10-20: 50 mg via ORAL
  Filled 2021-10-20: qty 1

## 2021-10-20 MED ORDER — SODIUM CHLORIDE 0.9 % IV BOLUS (SEPSIS): 1000.0000 mL | Freq: Once | INTRAVENOUS | Status: DC

## 2021-10-20 MED ORDER — ONDANSETRON HCL 4 MG PO TABS: 4.0000 mg | ORAL_TABLET | Freq: Four times a day (QID) | ORAL | Status: DC | PRN

## 2021-10-20 MED ORDER — ONDANSETRON HCL 4 MG/2ML IJ SOLN: 4.0000 mg | Freq: Four times a day (QID) | INTRAMUSCULAR | Status: DC | PRN

## 2021-10-20 MED ORDER — VENLAFAXINE HCL ER 75 MG PO CP24
75.0000 mg | ORAL_CAPSULE | Freq: Every day | ORAL | Status: DC
  Administered 2021-10-20: 75 mg via ORAL
  Filled 2021-10-20: qty 1

## 2021-10-20 MED ORDER — FAMOTIDINE 20 MG PO TABS
20.0000 mg | ORAL_TABLET | Freq: Every day | ORAL | Status: DC
  Administered 2021-10-21 – 2021-10-24 (×4): 20 mg via ORAL
  Filled 2021-10-20 (×4): qty 1

## 2021-10-20 MED ORDER — TRAZODONE HCL 100 MG PO TABS
100.0000 mg | ORAL_TABLET | Freq: Every day | ORAL | Status: DC
  Administered 2021-10-20 – 2021-10-23 (×4): 100 mg via ORAL
  Filled 2021-10-20 (×4): qty 1

## 2021-10-20 MED ORDER — SODIUM CHLORIDE 0.9 % IV SOLN: INTRAVENOUS | Status: AC

## 2021-10-20 MED ORDER — ACETAMINOPHEN 650 MG RE SUPP: 650.0000 mg | Freq: Four times a day (QID) | RECTAL | Status: DC | PRN

## 2021-10-20 MED ORDER — ENOXAPARIN SODIUM 40 MG/0.4ML IJ SOSY
40.0000 mg | PREFILLED_SYRINGE | INTRAMUSCULAR | Status: DC
  Administered 2021-10-20 – 2021-10-23 (×4): 40 mg via SUBCUTANEOUS
  Filled 2021-10-20 (×4): qty 0.4

## 2021-10-20 MED ORDER — MIRTAZAPINE 15 MG PO TABS
45.0000 mg | ORAL_TABLET | Freq: Every day | ORAL | Status: DC
  Administered 2021-10-20 – 2021-10-23 (×4): 45 mg via ORAL
  Filled 2021-10-20 (×2): qty 3
  Filled 2021-10-20: qty 2
  Filled 2021-10-20: qty 3

## 2021-10-20 MED ORDER — SENNOSIDES-DOCUSATE SODIUM 8.6-50 MG PO TABS
1.0000 | ORAL_TABLET | Freq: Every evening | ORAL | Status: DC | PRN
Start: 2021-10-20 — End: 2021-10-24
  Administered 2021-10-21: 1 via ORAL
  Filled 2021-10-20: qty 1

## 2021-10-20 MED ORDER — SODIUM CHLORIDE 0.9 % IV BOLUS (SEPSIS)
1000.0000 mL | Freq: Once | INTRAVENOUS | Status: AC
  Administered 2021-10-20: 1000 mL via INTRAVENOUS

## 2021-10-20 MED ORDER — AMLODIPINE BESYLATE 10 MG PO TABS
10.0000 mg | ORAL_TABLET | Freq: Every day | ORAL | Status: DC
  Administered 2021-10-20 – 2021-10-24 (×5): 10 mg via ORAL
  Filled 2021-10-20: qty 1
  Filled 2021-10-20: qty 2
  Filled 2021-10-20: qty 1
  Filled 2021-10-20: qty 2
  Filled 2021-10-20: qty 1

## 2021-10-20 MED ORDER — ACETAMINOPHEN 325 MG PO TABS: 650.0000 mg | ORAL_TABLET | Freq: Four times a day (QID) | ORAL | Status: DC | PRN

## 2021-10-20 NOTE — ED Provider Triage Note (Addendum)
Emergency Medicine Provider Triage Evaluation Note  Casey Silva , a 66 y.o. male  was evaluated in triage.  Pt complains of generalized weakness over the past week or so.  Patient states that he has had a recent increase in his anxiety medication and a sleep medication.  He endorses tremors, lethargy, weakness.  Patient also endorses frequent falls. He denies chest pain, shortness of breath, headache, abdominal pain.  Brief chart review shows multiple admissions for mental health related complaints.  Review of Systems  Positive: Weakness, tremors Negative: Shortness of breath  Physical Exam  BP (!) 119/93 (BP Location: Right Arm)   Pulse 79   Temp 98 F (36.7 C) (Oral)   Resp 13   Ht '5\' 8"'$  (1.727 m)   Wt 84.4 kg   SpO2 97%   BMI 28.28 kg/m  Gen:   Awake, tremulous Resp:  Normal effort  MSK:   Moves extremities without difficulty  Other:    Medical Decision Making  Medically screening exam initiated at 10:49 AM.  Appropriate orders placed.  [name] was informed that the remainder of the evaluation will be completed by another provider, this initial triage assessment does not replace that evaluation, and the importance of remaining in the ED until their evaluation is complete.     Dorothyann Peng, PA-C 10/20/21 [place], [name], PA-C 10/20/21 1052

## 2021-10-20 NOTE — ED Triage Notes (Signed)
Per [facility] pt coming from home pt c/o generalized weakness in legs with shakiness causing him to fall. Reports frequent falls over the past week. Patient adds he is having back pain and bouts of forgetfulness.

## 2021-10-20 NOTE — ED Notes (Signed)
Pt was unsuccessful at giving a urine sample at this time. Pt aware of the need for a urine sample.

## 2021-10-20 NOTE — H&P (Signed)
History and Physical    Belinda Bringhurst URK:[id number] DOB: [birth year] DOA: 10/20/2021  PCP: Cathleen Corti, PA-C   Patient coming from: Home   Chief Complaint:  Fatigue, tremors, frequent falls   HPI: Casey Silva is a pleasant 66 y.o. male with medical history significant for severe depression, anxiety, and hypertension, presenting to the emergency department for evaluation of fatigue, tremors, difficulty, and frequent falls.  Patient reports that he had had similar symptoms few months ago that had resolved for a while and tell worsening again over the past week.  He describes worsening tremor, difficulty with balance, and frequent falls without any appreciable injury.  He denies headache or focal numbness or weakness.  He denies fevers or chills.   ED Course: Upon arrival to the ED, patient is found to be afebrile and saturating well on room air with stable blood pressure.  Head CT was negative for acute intracranial abnormality.  Chemistry panel notable for creatinine 1.32.  Lithium level is also 1.32.  Patient was given 2 L of saline in the ED.  Review of Systems:  All other systems reviewed and apart from HPI, are negative.  Past Medical History:  Diagnosis Date   Anxiety    Asthma    Depression     History reviewed. No pertinent surgical history.  Social History:   reports that he has never smoked. He has never used smokeless tobacco. He reports that he does not drink alcohol and does not use drugs.  Allergies  Allergen Reactions   Dust Mite Mixed Allergen Ext [Mite (D. Farinae)] Shortness Of Breath   Bee Pollen Other (See Comments)    Watery Eyes    Family History  Problem Relation Age of Onset   Bipolar disorder Mother      Prior to Admission medications   Medication Sig Start Date End Date Taking? Authorizing Provider  amLODipine (NORVASC) 10 MG tablet Take 1 tablet (10 mg total) by mouth daily. 09/14/21  Yes Ival Bible, MD  famotidine (PEPCID) 20 MG  tablet Take 1 tablet (20 mg total) by mouth daily. 09/14/21  Yes Ival Bible, MD  lithium carbonate (ESKALITH) 450 MG CR tablet Take 1 tablet (450 mg total) by mouth every 12 (twelve) hours. Patient taking differently: Take 450 mg by mouth daily. 09/13/21  Yes Ival Bible, MD  mirtazapine (REMERON) 45 MG tablet Take 1 tablet (45 mg total) by mouth at bedtime. 09/13/21  Yes Ival Bible, MD  sertraline (ZOLOFT) 50 MG tablet Take 50 mg by mouth daily.   Yes [provider]  traZODone (DESYREL) 100 MG tablet Take 100 mg by mouth at bedtime.   Yes [provider]  venlafaxine XR (EFFEXOR-XR) 75 MG 24 hr capsule Take 1 capsule (75 mg total) by mouth daily with breakfast. Patient taking differently: Take 75 mg by mouth in the morning and at bedtime. 09/14/21  Yes Ival Bible, MD  hydrOXYzine (ATARAX) 25 MG tablet Take 1 tablet (25 mg total) by mouth 3 (three) times daily as needed for anxiety. Patient not taking: Reported on 10/20/2021 09/13/21   Ival Bible, MD  lidocaine (LIDODERM) 5 % Place 2 patches onto the skin daily. Remove & Discard patch within 12 hours or as directed by MD Patient not taking: Reported on 10/20/2021 09/13/21   Ival Bible, MD  QUEtiapine (SEROQUEL) 300 MG tablet Take 1 tablet (300 mg total) by mouth at bedtime. Patient not taking: Reported on 10/20/2021 09/13/21  Ival Bible, MD  traZODone (DESYREL) 50 MG tablet Take 1 tablet (50 mg total) by mouth at bedtime as needed for sleep. Patient not taking: Reported on 10/20/2021 09/13/21   Ival Bible, MD    Physical Exam: Vitals:   10/20/21 1831 10/20/21 1930 10/20/21 2239 10/20/21 2300  BP: 125/89 (!) 121/91 123/79 140/88  Pulse: 76 69  (!) 106  Resp: '16 17  17  '$ Temp:      TempSrc:      SpO2: 98% 97%  97%  Weight:      Height:        Constitutional: NAD, calm  Eyes: PERTLA, lids and conjunctivae normal ENMT: Mucous membranes are moist. Posterior  pharynx clear of any exudate or lesions.   Neck: supple, no masses  Respiratory: no wheezing, no crackles. No accessory muscle use.  Cardiovascular: S1 & S2 heard, regular rate and rhythm. No extremity edema.  Abdomen: No distension, no tenderness, soft. Bowel sounds active.  Musculoskeletal: no clubbing / cyanosis. No joint deformity upper and lower extremities.   Skin: no significant rashes, lesions, ulcers. Warm, dry, well-perfused. Neurologic: CN 2-12 grossly intact. Sensation intact. Strength 5/5 in all 4 limbs. Alert and oriented. Coarse tremor.  Psychiatric: Calm. Cooperative.    Labs and Imaging on Admission: I have personally reviewed following labs and imaging studies  CBC: Recent Labs  Lab 10/20/21 1103  WBC 8.5  HGB 15.0  HCT 44.7  MCV 88.0  PLT 885   Basic Metabolic Panel: Recent Labs  Lab 10/20/21 1103  NA 136  K 3.5  CL 105  CO2 23  GLUCOSE 98  BUN 12  CREATININE 1.32*  CALCIUM 9.3   GFR: Estimated Creatinine Clearance: 59.8 mL/min (A) (by C-G formula based on SCr of 1.32 mg/dL (H)). Liver Function Tests: No results for input(s): "AST", "ALT", "ALKPHOS", "BILITOT", "PROT", "ALBUMIN" in the last 168 hours. No results for input(s): "LIPASE", "AMYLASE" in the last 168 hours. Recent Labs  Lab 10/20/21 2123  AMMONIA 27   Coagulation Profile: No results for input(s): "INR", "PROTIME" in the last 168 hours. Cardiac Enzymes: No results for input(s): "CKTOTAL", "CKMB", "CKMBINDEX", "TROPONINI" in the last 168 hours. BNP (last 3 results) No results for input(s): "PROBNP" in the last 8760 hours. HbA1C: No results for input(s): "HGBA1C" in the last 72 hours. CBG: Recent Labs  Lab 10/20/21 1113  GLUCAP 113*   Lipid Profile: No results for input(s): "CHOL", "HDL", "LDLCALC", "TRIG", "CHOLHDL", "LDLDIRECT" in the last 72 hours. Thyroid Function Tests: Recent Labs    10/20/21 2123  TSH 2.314   Anemia Panel: Recent Labs    10/20/21 2123   VITAMINB12 204   Urine analysis:    Component Value Date/Time   COLORURINE YELLOW 10/20/2021 2259   APPEARANCEUR CLEAR 10/20/2021 2259   LABSPEC 1.018 10/20/2021 2259   PHURINE 6.0 10/20/2021 2259   GLUCOSEU NEGATIVE 10/20/2021 2259   HGBUR SMALL (A) 10/20/2021 2259   BILIRUBINUR NEGATIVE 10/20/2021 2259   KETONESUR 5 (A) 10/20/2021 2259   PROTEINUR NEGATIVE 10/20/2021 2259   UROBILINOGEN 0.2 03/08/2010 1146   NITRITE NEGATIVE 10/20/2021 2259   LEUKOCYTESUR NEGATIVE 10/20/2021 2259   Sepsis Labs: '@LABRCNTIP'$ (procalcitonin:4,lacticidven:4) )No results found for this or any previous visit (from the past 240 hour(s)).   Radiological Exams on Admission: CT HEAD WO CONTRAST (5MM)  Result Date: 10/20/2021 CLINICAL DATA:  Head trauma, moderate-severe.  Fall. EXAM: CT HEAD WITHOUT CONTRAST TECHNIQUE: Contiguous axial images were obtained from the base of  the skull through the vertex without intravenous contrast. RADIATION DOSE REDUCTION: This exam was performed according to the departmental dose-optimization program which includes automated exposure control, adjustment of the mA and/or kV according to patient size and/or use of iterative reconstruction technique. COMPARISON:  09/24/2019 FINDINGS: Brain: No acute intracranial abnormality. Specifically, no hemorrhage, hydrocephalus, mass lesion, acute infarction, or significant intracranial injury. Vascular: No hyperdense vessel or unexpected calcification. Skull: No acute calvarial abnormality. Sinuses/Orbits: No acute findings Other: None IMPRESSION: No acute intracranial abnormality. Electronically Signed   By: Rolm Baptise M.D.   On: 10/20/2021 18:48   DG Chest Portable 1 View  Result Date: 10/20/2021 CLINICAL DATA:  Weakness EXAM: PORTABLE CHEST 1 VIEW COMPARISON:  None Available. FINDINGS: Heart is normal size. No confluent airspace opacities or effusions. No acute bony abnormality. IMPRESSION: No active disease. Electronically Signed   By:  Rolm Baptise M.D.   On: 10/20/2021 18:47    EKG: Independently reviewed. Sinus rhythm.   Assessment/Plan   1. Frequent falls  - Presents with worsening tremor, fatigue, and ataxia   - Lithium level 1.3, may be responsible for tremor but wouldn't expect ataxia at this level  - Hold lithium, Zoloft, and Effexor, continue IVF hydration, check MRI brain, consult PT    2. Depression, anxiety  - Hold lithium, Zolft, and Effexor initially    3. Hypertension  - Continue Norvasc      DVT prophylaxis: Lovenox  Code Status: Full  Level of Care: Level of care: Telemetry Family Communication: none present  Disposition Plan:  Patient is from: home  Anticipated d/c is to: TBD Anticipated d/c date is: 7/14 or 10/22/21  Patient currently: Pending additional lab workup, PT eval  Consults called: none  Admission status: Observation     Vianne Bulls, MD [facility]  10/21/2021, 1:41 AM

## 2021-10-20 NOTE — ED Provider Notes (Signed)
[facility] DEPT Provider Note   CSN: [id number] Arrival date & time: 10/20/21  1026     History  Chief Complaint  Patient presents with   Weakness    Casey Silva is a 66 y.o. male.  Patient arrives with generalized weakness over the last several days.  States he had a recent increase in his lithium about a month ago.  He is having some tremors, lethargy, confusion.  Denies any chest pain or shortness of breath, abdominal pain.  Nothing makes it worse or better.  He has been having some several falls.  Had one today but does not think he hit his head or lost consciousness.  He lives by himself.  Denies any fever, cough, chills.  Patient with history of depression, anxiety, asthma.  The history is provided by the patient.       Home Medications Prior to Admission medications   Medication Sig Start Date End Date Taking? Authorizing Provider  amLODipine (NORVASC) 10 MG tablet Take 1 tablet (10 mg total) by mouth daily. 09/14/21   Ival Bible, MD  famotidine (PEPCID) 20 MG tablet Take 1 tablet (20 mg total) by mouth daily. 09/14/21   Ival Bible, MD  hydrOXYzine (ATARAX) 25 MG tablet Take 1 tablet (25 mg total) by mouth 3 (three) times daily as needed for anxiety. 09/13/21   Ival Bible, MD  lidocaine (LIDODERM) 5 % Place 2 patches onto the skin daily. Remove & Discard patch within 12 hours or as directed by MD 09/13/21   Ival Bible, MD  lithium carbonate (ESKALITH) 450 MG CR tablet Take 1 tablet (450 mg total) by mouth every 12 (twelve) hours. 09/13/21   Ival Bible, MD  mirtazapine (REMERON) 45 MG tablet Take 1 tablet (45 mg total) by mouth at bedtime. 09/13/21   Ival Bible, MD  QUEtiapine (SEROQUEL) 300 MG tablet Take 1 tablet (300 mg total) by mouth at bedtime. 09/13/21   Ival Bible, MD  traZODone (DESYREL) 50 MG tablet Take 1 tablet (50 mg total) by mouth at bedtime as needed for sleep. 09/13/21    Ival Bible, MD  venlafaxine XR (EFFEXOR-XR) 75 MG 24 hr capsule Take 1 capsule (75 mg total) by mouth daily with breakfast. 09/14/21   Ival Bible, MD      Allergies    Dust mite mixed allergen ext [mite (d. farinae)] and Bee pollen    Review of Systems   Review of Systems  Physical Exam Updated Vital Signs BP 125/89 (BP Location: Left Arm)   Pulse 76   Temp 98 F (36.7 C) (Oral)   Resp 16   Ht '5\' 8"'$  (1.727 m)   Wt 84.4 kg   SpO2 98%   BMI 28.28 kg/m  Physical Exam Vitals and nursing note reviewed.  Constitutional:      General: He is not in acute distress.    Appearance: He is well-developed. He is not ill-appearing.  HENT:     Head: Normocephalic and atraumatic.     Mouth/Throat:     Mouth: Mucous membranes are dry.  Eyes:     Extraocular Movements: Extraocular movements intact.     Conjunctiva/sclera: Conjunctivae normal.     Pupils: Pupils are equal, round, and reactive to light.  Cardiovascular:     Rate and Rhythm: Normal rate and regular rhythm.     Pulses: Normal pulses.     Heart sounds: No murmur heard. Pulmonary:  Effort: Pulmonary effort is normal. No respiratory distress.     Breath sounds: Normal breath sounds.  Abdominal:     Palpations: Abdomen is soft.     Tenderness: There is no abdominal tenderness.  Musculoskeletal:        General: No swelling. Normal range of motion.     Cervical back: Normal range of motion and neck supple.  Skin:    General: Skin is warm and dry.     Capillary Refill: Capillary refill takes less than 2 seconds.  Neurological:     General: No focal deficit present.     Mental Status: He is alert.     Comments: Patient with some mild confusion on exam, some mild tremors, is able to move all extremities with equal strength, sensations intact, normal speech, no facial droop, normal visual fields  Psychiatric:        Mood and Affect: Mood normal.     ED Results / Procedures / Treatments   Labs (all  labs ordered are listed, but only abnormal results are displayed) Labs Reviewed  BASIC METABOLIC PANEL - Abnormal; Notable for the following components:      Result Value   Creatinine, Ser 1.32 (*)    All other components within normal limits  LITHIUM LEVEL - Abnormal; Notable for the following components:   Lithium Lvl 1.32 (*)    All other components within normal limits  CBG MONITORING, ED - Abnormal; Notable for the following components:   Glucose-Capillary 113 (*)    All other components within normal limits  CBC  URINALYSIS, ROUTINE W REFLEX MICROSCOPIC    EKG EKG Interpretation  Date/Time:  Thursday October 20 2021 10:39:15 EDT Ventricular Rate:  80 PR Interval:  169 QRS Duration: 92 QT Interval:  378 QTC Calculation: 436 R Axis:   43 Text Interpretation: Sinus rhythm Confirmed by Lennice Sites ([id number]) on 10/20/2021 6:05:42 PM  Radiology CT HEAD WO CONTRAST (5MM)  Result Date: 10/20/2021 CLINICAL DATA:  Head trauma, moderate-severe.  Fall. EXAM: CT HEAD WITHOUT CONTRAST TECHNIQUE: Contiguous axial images were obtained from the base of the skull through the vertex without intravenous contrast. RADIATION DOSE REDUCTION: This exam was performed according to the departmental dose-optimization program which includes automated exposure control, adjustment of the mA and/or kV according to patient size and/or use of iterative reconstruction technique. COMPARISON:  09/24/2019 FINDINGS: Brain: No acute intracranial abnormality. Specifically, no hemorrhage, hydrocephalus, mass lesion, acute infarction, or significant intracranial injury. Vascular: No hyperdense vessel or unexpected calcification. Skull: No acute calvarial abnormality. Sinuses/Orbits: No acute findings Other: None IMPRESSION: No acute intracranial abnormality. Electronically Signed   By: Rolm Baptise M.D.   On: 10/20/2021 18:48   DG Chest Portable 1 View  Result Date: 10/20/2021 CLINICAL DATA:  Weakness EXAM: PORTABLE CHEST  1 VIEW COMPARISON:  None Available. FINDINGS: Heart is normal size. No confluent airspace opacities or effusions. No acute bony abnormality. IMPRESSION: No active disease. Electronically Signed   By: Rolm Baptise M.D.   On: 10/20/2021 18:47    Procedures .Critical Care  Performed by: Lennice Sites, DO Authorized by: Lennice Sites, DO   Critical care provider statement:    Critical care time (minutes):  35   Critical care was necessary to treat or prevent imminent or life-threatening deterioration of the following conditions:  Toxidrome   Critical care was time spent personally by me on the following activities:  Blood draw for specimens, development of treatment plan with patient or surrogate, discussions with  primary provider, evaluation of patient's response to treatment, examination of patient, obtaining history from patient or surrogate, ordering and performing treatments and interventions, ordering and review of laboratory studies, ordering and review of radiographic studies, pulse oximetry, review of old charts and re-evaluation of patient's condition   Care discussed with: admitting provider       Medications Ordered in ED Medications  sodium chloride 0.9 % bolus 1,000 mL (1,000 mLs Intravenous New Bag/Given 10/20/21 1859)    Followed by  sodium chloride 0.9 % bolus 1,000 mL (1,000 mLs Intravenous New Bag/Given 10/20/21 1900)    Followed by  0.9 %  sodium chloride infusion (has no administration in time range)    ED Course/ Medical Decision Making/ A&P                           Medical Decision Making Amount and/or Complexity of Data Reviewed Labs: ordered. Radiology: ordered.  Risk Prescription drug management. Decision regarding hospitalization.   Casey Silva is here with generalized weakness, confusion.  History of anxiety and depression.  Patient states symptoms for the last several days.  He has been having some falls as he is having a hard time walking at times.  He  has had some confusion.  Felt a little nauseous.  Has had some tremors.  States that he had his lithium increased recently.  Denies any vision changes or speech changes.  Denies any infectious symptoms.  Blood work has already been collected prior to my evaluation.  Neurologically he does not appear to have any focal symptoms.  He has some generalized weakness in all 4 extremities.  Has some mild tremors at time.  He seems little bit confused and slow to answer questions at times.  Vital signs are normal.  No fever.  He did have a CBC and BMP and lithium level drawn..  Per my review and interpretation of those labs he does have a mild AKI with a creatinine of 1.3.  Lithium level is 1.32 and elevated.  EKG per my review interpretation shows sinus rhythm.  No ischemic changes.  Overall my suspicion is that he is having some lithium toxicity that is causing his symptoms.  Given that he is had some recent falls we will get a head CT for further evaluation.  At this time I have less suspicion for stroke.  We will start aggressive IV fluid hydration with 2 L of normal saline and IV maintenance fluid.  Anticipate admission.  Per my review and interpretation of chest x-ray there is no pneumonia or pneumothorax.  Head CT with no acute findings.  Overall my suspicion is that symptoms are likely secondary to lithium toxicity.  At this time we will treat with aggressive IV fluids.  Will admit to medicine.  This chart was dictated using voice recognition software.  Despite best efforts to proofread,  errors can occur which can change the documentation meaning.         Final Clinical Impression(s) / ED Diagnoses Final diagnoses:  Lithium toxicity, accidental or unintentional, initial encounter    Rx / DC Orders ED Discharge Orders          Ordered    Rapid urine drug screen (hospital performed)        10/20/21 1831    Ethanol        10/20/21 1831              Lennice Sites, DO 10/20/21  1911  

## 2021-10-21 ENCOUNTER — Observation Stay (HOSPITAL_COMMUNITY): Payer: Self-pay

## 2021-10-21 ENCOUNTER — Other Ambulatory Visit: Payer: Self-pay

## 2021-10-21 DIAGNOSIS — R296 Repeated falls: Secondary | ICD-10-CM | POA: Diagnosis not present

## 2021-10-21 LAB — CBC
HCT: 41.1 % (ref 39.0–52.0)
Hemoglobin: 13.8 g/dL (ref 13.0–17.0)
MCH: 29.7 pg (ref 26.0–34.0)
MCHC: 33.6 g/dL (ref 30.0–36.0)
MCV: 88.4 fL (ref 80.0–100.0)
Platelets: 269 10*3/uL (ref 150–400)
RBC: 4.65 MIL/uL (ref 4.22–5.81)
RDW: 13.7 % (ref 11.5–15.5)
WBC: 8.4 10*3/uL (ref 4.0–10.5)
nRBC: 0 % (ref 0.0–0.2)

## 2021-10-21 LAB — BASIC METABOLIC PANEL
Anion gap: 6 (ref 5–15)
BUN: 11 mg/dL (ref 8–23)
CO2: 23 mmol/L (ref 22–32)
Calcium: 8.8 mg/dL — ABNORMAL LOW (ref 8.9–10.3)
Chloride: 108 mmol/L (ref 98–111)
Creatinine, Ser: 1.05 mg/dL (ref 0.61–1.24)
GFR, Estimated: >60 mL/min (ref >60)
Glucose, Bld: 98 mg/dL (ref 70–99)
Potassium: 3.5 mmol/L (ref 3.5–5.1)
Sodium: 137 mmol/L (ref 135–145)

## 2021-10-21 LAB — RPR: RPR Ser Ql: NONREACTIVE

## 2021-10-21 LAB — LITHIUM LEVEL: Lithium Lvl: 0.72 mmol/L (ref 0.60–1.20)

## 2021-10-21 NOTE — ED Notes (Signed)
Patient transported to MRI 

## 2021-10-21 NOTE — Evaluation (Signed)
Physical Therapy Evaluation Patient Details Name: Casey Silva MRN: [id number] DOB: 17-Oct-1955 Today's Date: 10/21/2021  History of Present Illness  66 y.o. male with medical history significant for severe depression, anxiety, and hypertension, presenting to the emergency department for evaluation of fatigue, tremors, difficulty, and frequent falls. MRI negative for  acute findings. Neurology consult pending.  Clinical Impression  The patient presents with slow responses, speech is halting.  Patient reports frequent falls. Stated he falls because" I have a psychological problem."  Patient  demonstrates shuffling, festinating gait. Improved with use  of a RW.  Patient resides in a boarding house and  has to be independent.  Continue PT for mobility. Pt admitted with above diagnosis.  Pt currently with functional limitations due to the deficits listed below (see PT Problem List). Pt will benefit from skilled PT to increase their independence and safety with mobility to allow discharge to the venue listed below.         Recommendations for follow up therapy are one component of a multi-disciplinary discharge planning process, led by the attending physician.  Recommendations may be updated based on patient status, additional functional criteria and insurance authorization.  Follow Up Recommendations Home health PT with support vs SNF if eligible to go.       Assistance Recommended at Discharge Set up Supervision/Assistance  Patient can return home with the following  A little help with walking and/or transfers;A lot of help with bathing/dressing/bathroom;Help with stairs or ramp for entrance;Assist for transportation;Assistance with cooking/housework    Equipment Recommendations Rolling walker (2 wheels)  Recommendations for Other Services       Functional Status Assessment Patient has had a recent decline in their functional status and demonstrates the ability to make significant  improvements in function in a reasonable and predictable amount of time.     Precautions / Restrictions Precautions Precautions: Fall      Mobility  Bed Mobility Overal bed mobility: Modified Independent                  Transfers Overall transfer level: Needs assistance   Transfers: Sit to/from Stand Sit to Stand: Min guard           General transfer comment: supported at [facility]    Ambulation/Gait Ambulation/Gait assistance: Min guard, Min assist Gait Distance (Feet): 90 Feet Assistive device: Rolling walker (2 wheels), None Gait Pattern/deviations: Step-through pattern, Shuffle, Festinating Gait velocity: decr     General Gait Details: noted small steps and shuuffling, with RW. without RW x ~ 6 ', more pronounced shuffle, patient requested use of RW.  Stairs            Wheelchair Mobility    Modified Rankin (Stroke Patients Only)       Balance Overall balance assessment: History of Falls, Needs assistance Sitting-balance support: Feet supported, Bilateral upper extremity supported Sitting balance-Leahy Scale: Fair     Standing balance support: During functional activity, No upper extremity supported Standing balance-Leahy Scale: Poor                               Pertinent Vitals/Pain Pain Assessment Pain Assessment: Faces Faces Pain Scale: Hurts even more Pain Location: low back and  neck Pain Descriptors / Indicators: Discomfort Pain Intervention(s): Monitored during session    Home Living Family/patient expects to be discharged to:: Private residence Living Arrangements: Alone   Type of Home:  (boarding house) Home Access: Stairs  to enter Entrance Stairs-Rails: Right;Left Entrance Stairs-Number of Steps: 3   Home Layout: One level Home Equipment: None      Prior Function Prior Level of Function : Independent/Modified Independent;Driving             Mobility Comments: reports not ambulating very much due to  falls and  shuffling gait       Hand Dominance   Dominant Hand: Right    Extremity/Trunk Assessment   Upper Extremity Assessment Upper Extremity Assessment: RUE deficits/detail;LUE deficits/detail RUE Deficits / Details: dysmetria, trmors noted, decreased  sholder elevation, LUE Deficits / Details: similar to RUE.    Lower Extremity Assessment Lower Extremity Assessment: Generalized weakness (mild tremos noted with  Heel slides)    Cervical / Trunk Assessment Cervical / Trunk Assessment: Normal  Communication      Cognition Arousal/Alertness: Awake/alert Behavior During Therapy: WFL for tasks assessed/performed, Flat affect   Area of Impairment: Orientation                 Orientation Level: Time             General Comments: oriented to hospital, Friday and JUly  and yr        General Comments      Exercises     Assessment/Plan    PT Assessment Patient needs continued PT services  PT Problem List Decreased strength;Decreased mobility;Decreased safety awareness;Decreased range of motion;Decreased coordination;Decreased knowledge of precautions;Decreased activity tolerance;Decreased cognition;Decreased balance;Pain       PT Treatment Interventions DME instruction;Therapeutic activities;Cognitive remediation;Gait training;Therapeutic exercise;Patient/family education;Functional mobility training;Balance training    PT Goals (Current goals can be found in the Care Plan section)  Acute Rehab PT Goals Patient Stated Goal: I need  help, I fall PT Goal Formulation: With patient Time For Goal Achievement: 11/04/21 Potential to Achieve Goals: Fair    Frequency Min 3X/week     Co-evaluation               AM-PAC PT "6 Clicks" Mobility  Outcome Measure Help needed turning from your back to your side while in a flat bed without using bedrails?: None Help needed moving from lying on your back to sitting on the side of a flat bed without using  bedrails?: None Help needed moving to and from a bed to a chair (including a wheelchair)?: A Little Help needed standing up from a chair using your arms (e.g., wheelchair or bedside chair)?: A Little Help needed to walk in hospital room?: A Lot Help needed climbing 3-5 steps with a railing? : A Lot 6 Click Score: 18    End of Session Equipment Utilized During Treatment: Gait belt Activity Tolerance: Patient tolerated treatment well Patient left: in bed;with call bell/phone within reach Nurse Communication: Mobility status PT Visit Diagnosis: Unsteadiness on feet (R26.81);Muscle weakness (generalized) (M62.81);Repeated falls (R29.6);Pain Pain - Right/Left: Right Pain - part of body: Shoulder    Time: 1000-1030 PT Time Calculation (min) (ACUTE ONLY): 30 min   Charges:   PT Evaluation $PT Eval Low Complexity: 1 Low PT Treatments $Gait Training: 8-22 mins        [place] Office [phone] Weekend [phone]   Claretha Cooper 10/21/2021, 10:44 AM

## 2021-10-21 NOTE — Progress Notes (Addendum)
  Progress Note   Patient: Casey Silva WOE:[id number] DOB: [birth year] DOA: 10/20/2021     0 DOS: the patient was seen and examined on 10/21/2021   Brief hospital course: 66 y.o. male with medical history significant for severe depression, anxiety, and hypertension, presenting to the emergency department for evaluation of fatigue, tremors, difficulty, and frequent falls.  Patient reports that he had had similar symptoms few months ago that had resolved for a while and tell worsening again over the past week.  He describes worsening tremor, difficulty with balance, and frequent falls without any appreciable injury.  He denies headache or focal numbness or weakness.  He denies fevers or chills.    7/14 psych c/s ordered for elev lithium levels, neurology eval for falls, gait instability, MRI normal.  Assessment and Plan: No notes have been filed under this hospital service. Service: Hospitalist  1. Frequent falls  4 falls in the last week per patient NO n/v, infectious symptoms. Denies illicit substance use/ETOH use - Presents with worsening tremor, fatigue, and ataxia   - Lithium level 1.3, may be responsible for tremor but wouldn't expect ataxia at this level  - Hold lithium, Zoloft, and Effexor, continue IVF hydration, check F/u MRI brain, F/u PT recs F/u Neuro recs--hold off neuro, d/w Neuro symptoms could present as direct consequence of supratherapeutic lithium levels. If remain persistent with medical management of elevated lithium, will re-engage neuro   2. Depression, anxiety  - Hold lithium, Zolft, and Effexor initially    F/u psychiatry input for mood management  3. Hypertension  - Continue Norvasc        Subjective: NAEON. Still having some dysequilibrium, some mild orthostatic Working with PT today.   Physical Exam: Vitals:   10/20/21 2239 10/20/21 2300 10/21/21 0200 10/21/21 0500  BP: 123/79 140/88 114/77 (!) 122/91  Pulse:  (!) 106 77 68  Resp:  '17 18 17  '$ Temp:       TempSrc:      SpO2:  97% 90% 94%  Weight:      Height:       Constitutional: NAD, calm  Eyes: PERTLA, lids and conjunctivae normal ENMT: Mucous membranes are moist. Posterior pharynx clear of any exudate or lesions.   Neck: supple, no masses  Respiratory: no wheezing, no crackles. No accessory muscle use.  Cardiovascular: S1 & S2 heard, regular rate and rhythm. No extremity edema.  Abdomen: No distension, no tenderness, soft. Bowel sounds active.  Musculoskeletal: no clubbing / cyanosis. No joint deformity upper and lower extremities.   Skin: no significant rashes, lesions, ulcers. Warm, dry, well-perfused. Neurologic: CN 2-12 grossly intact. Sensation intact. Strength 5/5 in all 4 limbs. Alert and oriented. Coarse tremor.  Psychiatric: Calm. Cooperative.  Data Reviewed:  There are no new results to review at this time.  Family Communication: none available   Disposition: Status is: Observation The patient remains OBS appropriate and will d/c before 2 midnights.  Planned Discharge Destination: Home    Time spent: 25 minutes  Author: Vanna Scotland, MD 10/21/2021 7:19 AM  For on call review [email or web address].

## 2021-10-21 NOTE — Plan of Care (Signed)

## 2021-10-21 NOTE — Hospital Course (Signed)
66 y.o. male with medical history significant for severe depression, anxiety, and hypertension, presenting to the emergency department for evaluation of fatigue, tremors, difficulty, and frequent falls.  Patient reports that he had had similar symptoms few months ago that had resolved for a while and tell worsening again over the past week.  He describes worsening tremor, difficulty with balance, and frequent falls without any appreciable injury.  He denies headache or focal numbness or weakness.  He denies fevers or chills.    7/14 psych c/s ordered for elev lithium levels, neurology eval for falls, gait instability, MRI normal.

## 2021-10-21 NOTE — Consult Note (Signed)
[facility] Consult   Reason for Consult: Lithium toxicity Referring Physician: Dr. Alanda Amass Patient Identification: Casey Silva MRN:  [id number] Principal Diagnosis: Frequent falls Diagnosis:  Principal Problem:   Frequent falls Active Problems:   Benign essential hypertension   Depression with anxiety   Total Time spent with patient: 45 minutes  Subjective:   Casey Silva is a 66 y.o. male patient admitted with lithium toxicity.  Psych consult for lithium toxicity.  Patient initial lithium level 1.3, after fluid resuscitation of 2 L repeat lithium level was 0.72.  Patient is seen and assessed by the psychiatric nurse practitioner.  At present he currently denies any acute psychiatric concerns.  He states he is not feeling well, and does not seem to think it is his lithium level that is making him fall.  He states his main concern is increase in falls and he is concerned about his safety at home, not his psych medication.  Discussed toxic lithium levels do result in worsening neurological symptoms.  He denies increased sun exposure, and or recent adjustments initiation of new medication that increases his risk for lithium toxicity.  Patient reports with recent admission, his depression and anxiety have slightly improved.  He denies any current suicidal ideations, although he states he has a history of chronic suicidal ideation.  He further denies any increase in anxiety.  While he denies any trauma symptoms at present, he does endorse fear and hypervigilance as it pertains to falling.  He continues to remain concerned about his safety at home, and feels helpless at times when he falls and cannot get up.  He feels relieved that someone is taking him serious and ordered an MRI.  He does agree to minor adjustments with his medication, although he considers himself to be stable from a psychiatric standpoint.  On evaluation patient is alert and oriented, cooperative, very pleasant and  engages well with nurse practitioner.  He is a Caucasian male that lives alone, currently divorced minimal support system who presented for increasing falls.  He has a previous psychiatric history of major depressive disorder, severe chronic suicidal ideations, and anxiety.  He currently is prescribed multiple psychotropic meds, and and reports compliance with all of the above.  He is [facility] of [facility], and receives medication packs on a weekly basis.  He denies any acute psychiatric symptoms at this time, although he endorses physical complaints of just not feeling well, dizzy, and concerned about falls.  Patient does not meet inpatient psychiatric criteria at this time, will adjust psychotropic medications, and continue with outpatient psychiatric services.  HPI:  Patient arrives with generalized weakness over the last several days.  States he had a recent increase in his lithium about a month ago.  He is having some tremors, lethargy, confusion.  Denies any chest pain or shortness of breath, abdominal pain.  Nothing makes it worse or better.  He has been having some several falls.  Had one today but does not think he hit his head or lost consciousness.  He lives by himself.  Denies any fever, cough, chills.  Patient with history of depression, anxiety, asthma.  Past Psychiatric History: Depression and anxiety.  [facility]; Dr. Clearence Ped. Recent history of ECT, ineffective. Recent inpatient hospitalization at [facility] in June 2023, patient was discharged on lithium 450 mg p.o. twice daily every 12 hours, mirtazapine 45 mg p.o. nightly, quetiapine 300 mg p.o. nightly, trazodone 50 mg p.o. nightly, venlafaxine XR  75 mg p.o. every morning.  Risk to Self: Denies currently; last had suicidal thoughts in June 2023 Risk to Others: Denies Prior Inpatient Therapy: Multiple inpatient admissions, last admission in June 2023 Prior Outpatient Therapy: [facility], Casey Silva at [facility]  Past Medical History:  Past Medical History:  Diagnosis Date   Anxiety    Asthma    Depression    History reviewed. No pertinent surgical history. Family History:  Family History  Problem Relation Age of Onset   Bipolar disorder Mother    Family Psychiatric  History:  Social History:  Social History   Substance and Sexual Activity  Alcohol Use No     Social History   Substance and Sexual Activity  Drug Use No    Social History   Socioeconomic History   Marital status: Single    Spouse name: Not on file   Number of children: 1   Years of education: Not on file   Highest education level: GED or equivalent  Occupational History   Not on file  Tobacco Use   Smoking status: Never   Smokeless tobacco: Never  Vaping Use   Vaping Use: Unknown  Substance and Sexual Activity   Alcohol use: No   Drug use: No   Sexual activity: Not Currently  Other Topics Concern   Not on file  Social History Narrative   Marital status:  Single   Children: Daughter   Employment:  Unemployment. previous work for [employer].   Alcohol:  None   Drugs:  none   Exercise:  Regularly very   Religion:  Jewish   Social Determinants of Health   Financial Resource Strain: Medium Risk (10/02/2019)   Overall Financial Resource Strain (CARDIA)    Difficulty of Paying Living Expenses: Somewhat hard  Food Insecurity: No Food Insecurity (10/02/2019)   Hunger Vital Sign    Worried About Running Out of Food in the Last Year: Never true    Ran Out of Food in the Last Year: Never true  Transportation Needs: No Transportation Needs (10/02/2019)   PRAPARE - Hydrologist (Medical): No    Lack of Transportation (Non-Medical): No  Physical Activity: Insufficiently Active (10/02/2019)   Exercise Vital Sign    Days of Exercise per Week: 2 days    Minutes of Exercise per Session: 30 min   Stress: Stress Concern Present (10/02/2019)   [name]    Feeling of Stress : Rather much  Social Connections: Socially Isolated (10/02/2019)   Social Connection and Isolation Panel [NHANES]    Frequency of Communication with Friends and Family: Never    Frequency of Social Gatherings with Friends and Family: Never    Attends Religious Services: Never    Marine scientist or Organizations: No    Attends Archivist Meetings: Never    Marital Status: Divorced   Additional Social History:    Allergies:   Allergies  Allergen Reactions   Dust Mite Mixed Allergen Ext [Mite (D. Farinae)] Shortness Of Breath   Bee Pollen Other (See Comments)    Watery Eyes    Labs:  Results for orders placed or performed during the hospital encounter of 10/20/21 (from the past 48 hour(s))  Basic metabolic panel     Status: Abnormal   Collection Time: 10/20/21 11:03 AM  Result Value Ref Range   Sodium 136 135 -  145 mmol/L   Potassium 3.5 3.5 - 5.1 mmol/L   Chloride 105 98 - 111 mmol/L   CO2 23 22 - 32 mmol/L   Glucose, Bld 98 70 - 99 mg/dL    Comment: Glucose reference range applies only to samples taken after fasting for at least 8 hours.   BUN 12 8 - 23 mg/dL   Creatinine, Ser 1.32 (H) 0.61 - 1.24 mg/dL   Calcium 9.3 8.9 - 10.3 mg/dL   GFR, Estimated >60 >60 mL/min    Comment: (NOTE) Calculated using the CKD-EPI Creatinine Equation (2021)    Anion gap 8 5 - 15    Comment: Performed at [facility] [place], [place] [address], [place], [place] [address]  CBC     Status: None   Collection Time: 10/20/21 11:03 AM  Result Value Ref Range   WBC 8.5 4.0 - 10.5 K/uL   RBC 5.08 4.22 - 5.81 MIL/uL   Hemoglobin 15.0 13.0 - 17.0 g/dL   HCT 44.7 39.0 - 52.0 %   MCV 88.0 80.0 - 100.0 fL   MCH 29.5 26.0 - 34.0 pg   MCHC 33.6 30.0 - 36.0 g/dL   RDW 13.6 11.5 - 15.5 %   Platelets 304 150 - 400 K/uL   nRBC 0.0 0.0  - 0.2 %    Comment: Performed at [facility], [place] [address] [place], [place] [address]  Lithium level     Status: Abnormal   Collection Time: 10/20/21 11:03 AM  Result Value Ref Range   Lithium Lvl 1.32 (H) 0.60 - 1.20 mmol/L    Comment: Performed at [facility], [place] [address] [place], [place] [address]  CBG monitoring, ED     Status: Abnormal   Collection Time: 10/20/21 11:13 AM  Result Value Ref Range   Glucose-Capillary 113 (H) 70 - 99 mg/dL    Comment: Glucose reference range applies only to samples taken after fasting for at least 8 hours.  Ethanol     Status: None   Collection Time: 10/20/21  9:23 PM  Result Value Ref Range   Alcohol, Ethyl (B) <10 <10 mg/dL    Comment: (NOTE) Lowest detectable limit for serum alcohol is 10 mg/dL.  For medical purposes only. Performed at [facility], [place] [address] [place], [place] [address]   TSH     Status: None   Collection Time: 10/20/21  9:23 PM  Result Value Ref Range   TSH 2.314 0.350 - 4.500 uIU/mL    Comment: Performed by a 3rd Generation assay with a functional sensitivity of <=0.01 uIU/mL. Performed at [facility], [place] [address] [place], [place] [address]   Vitamin B12     Status: None   Collection Time: 10/20/21  9:23 PM  Result Value Ref Range   Vitamin B-12 204 180 - 914 pg/mL    Comment: (NOTE) This assay is not validated for testing neonatal or myeloproliferative syndrome specimens for Vitamin B12 levels. Performed at [facility], [place] [address] [place], [place] [address]   Ammonia     Status: None   Collection Time: 10/20/21  9:23 PM  Result Value Ref Range   Ammonia 27 9 - 35 umol/L    Comment: Performed at [facility], [place] [address], [place], [place] [address]  RPR     Status: None   Collection Time: 10/20/21  9:23 PM  Result Value Ref Range   RPR Ser  Ql NON REACTIVE NON REACTIVE     Comment: Performed at [facility], [place] [address] [place], [place] [address]  Urinalysis, Routine w reflex microscopic     Status: Abnormal   Collection Time: 10/20/21 10:59 PM  Result Value Ref Range   Color, Urine YELLOW YELLOW   APPearance CLEAR CLEAR   Specific Gravity, Urine 1.018 1.005 - 1.030   pH 6.0 5.0 - 8.0   Glucose, UA NEGATIVE NEGATIVE mg/dL   Hgb urine dipstick SMALL (A) NEGATIVE   Bilirubin Urine NEGATIVE NEGATIVE   Ketones, ur 5 (A) NEGATIVE mg/dL   Protein, ur NEGATIVE NEGATIVE mg/dL   Nitrite NEGATIVE NEGATIVE   Leukocytes,Ua NEGATIVE NEGATIVE   RBC / HPF 0-5 0 - 5 RBC/hpf   WBC, UA 0-5 0 - 5 WBC/hpf   Bacteria, UA NONE SEEN NONE SEEN   Mucus PRESENT     Comment: Performed at [facility], [place] [address] [place], [place] [address]  Rapid urine drug screen (hospital performed)     Status: None   Collection Time: 10/20/21 10:59 PM  Result Value Ref Range   Opiates NONE DETECTED NONE DETECTED   Cocaine NONE DETECTED NONE DETECTED   Benzodiazepines NONE DETECTED NONE DETECTED   Amphetamines NONE DETECTED NONE DETECTED   Tetrahydrocannabinol NONE DETECTED NONE DETECTED   Barbiturates NONE DETECTED NONE DETECTED    Comment: (NOTE) DRUG SCREEN FOR MEDICAL PURPOSES ONLY.  IF CONFIRMATION IS NEEDED FOR ANY PURPOSE, NOTIFY LAB WITHIN 5 DAYS.  LOWEST DETECTABLE LIMITS FOR URINE DRUG SCREEN Drug Class                     Cutoff (ng/mL) Amphetamine and metabolites    1000 Barbiturate and metabolites    200 Benzodiazepine                 875 Tricyclics and metabolites     300 Opiates and metabolites        300 Cocaine and metabolites        300 THC                            50 Performed at [facility], [place] [address] [place], [place] [address]   Basic metabolic panel     Status: Abnormal   Collection Time: 10/21/21  5:50 AM  Result Value Ref Range   Sodium 137 135 - 145 mmol/L   Potassium 3.5 3.5 - 5.1 mmol/L    Chloride 108 98 - 111 mmol/L   CO2 23 22 - 32 mmol/L   Glucose, Bld 98 70 - 99 mg/dL    Comment: Glucose reference range applies only to samples taken after fasting for at least 8 hours.   BUN 11 8 - 23 mg/dL   Creatinine, Ser 1.05 0.61 - 1.24 mg/dL   Calcium 8.8 (L) 8.9 - 10.3 mg/dL   GFR, Estimated >60 >60 mL/min    Comment: (NOTE) Calculated using the CKD-EPI Creatinine Equation (2021)    Anion gap 6 5 - 15    Comment: Performed at [facility], [place] [address], [place], [place] [address]  CBC     Status: None   Collection Time: 10/21/21  5:50 AM  Result Value Ref Range   WBC 8.4 4.0 - 10.5 K/uL   RBC 4.65 4.22 - 5.81 MIL/uL   Hemoglobin 13.8 13.0 - 17.0 g/dL   HCT  41.1 39.0 - 52.0 %   MCV 88.4 80.0 - 100.0 fL   MCH 29.7 26.0 - 34.0 pg   MCHC 33.6 30.0 - 36.0 g/dL   RDW 13.7 11.5 - 15.5 %   Platelets 269 150 - 400 K/uL   nRBC 0.0 0.0 - 0.2 %    Comment: Performed at [facility], [place] [address], [place], [place] [address]  Lithium level     Status: None   Collection Time: 10/21/21  5:50 AM  Result Value Ref Range   Lithium Lvl 0.72 0.60 - 1.20 mmol/L    Comment: Performed at [facility], [place] [address] [place], [place] [address]    Current Facility-Administered Medications  Medication Dose Route Frequency Provider Last Rate Last Admin   acetaminophen (TYLENOL) tablet 650 mg  650 mg Oral Q6H PRN Opyd, Ilene Qua, MD       Or   acetaminophen (TYLENOL) suppository 650 mg  650 mg Rectal Q6H PRN Opyd, Ilene Qua, MD       amLODipine (NORVASC) tablet 10 mg  10 mg Oral Daily Opyd, Ilene Qua, MD   10 mg at 10/20/21 2239   enoxaparin (LOVENOX) injection 40 mg  40 mg Subcutaneous Q24H Opyd, Ilene Qua, MD   40 mg at 10/20/21 2238   famotidine (PEPCID) tablet 20 mg  20 mg Oral Daily Opyd, Ilene Qua, MD       mirtazapine (REMERON) tablet 45 mg  45 mg Oral QHS Opyd, Ilene Qua, MD   45 mg at 10/20/21 2239   ondansetron (ZOFRAN)  tablet 4 mg  4 mg Oral Q6H PRN Opyd, Ilene Qua, MD       Or   ondansetron (ZOFRAN) injection 4 mg  4 mg Intravenous Q6H PRN Opyd, Ilene Qua, MD       senna-docusate (Senokot-S) tablet 1 tablet  1 tablet Oral QHS PRN Opyd, Ilene Qua, MD       sodium chloride flush (NS) 0.9 % injection 3 mL  3 mL Intravenous Q12H Opyd, Ilene Qua, MD   3 mL at 10/20/21 2316   traZODone (DESYREL) tablet 100 mg  100 mg Oral QHS Opyd, Ilene Qua, MD   100 mg at 10/20/21 2239   Current Outpatient Medications  Medication Sig Dispense Refill   amLODipine (NORVASC) 10 MG tablet Take 1 tablet (10 mg total) by mouth daily.     famotidine (PEPCID) 20 MG tablet Take 1 tablet (20 mg total) by mouth daily.     lithium carbonate (ESKALITH) 450 MG CR tablet Take 1 tablet (450 mg total) by mouth every 12 (twelve) hours. (Patient taking differently: Take 450 mg by mouth daily.)     mirtazapine (REMERON) 45 MG tablet Take 1 tablet (45 mg total) by mouth at bedtime.     sertraline (ZOLOFT) 50 MG tablet Take 50 mg by mouth daily.     traZODone (DESYREL) 100 MG tablet Take 100 mg by mouth at bedtime.     venlafaxine XR (EFFEXOR-XR) 75 MG 24 hr capsule Take 1 capsule (75 mg total) by mouth daily with breakfast. (Patient taking differently: Take 75 mg by mouth in the morning and at bedtime.)     hydrOXYzine (ATARAX) 25 MG tablet Take 1 tablet (25 mg total) by mouth 3 (three) times daily as needed for anxiety. (Patient not taking: Reported on 10/20/2021) 30 tablet 0   lidocaine (LIDODERM) 5 % Place 2 patches onto the skin daily. Remove & Discard patch within  12 hours or as directed by MD (Patient not taking: Reported on 10/20/2021) 30 patch 0   QUEtiapine (SEROQUEL) 300 MG tablet Take 1 tablet (300 mg total) by mouth at bedtime. (Patient not taking: Reported on 10/20/2021)     traZODone (DESYREL) 50 MG tablet Take 1 tablet (50 mg total) by mouth at bedtime as needed for sleep. (Patient not taking: Reported on 10/20/2021)       Musculoskeletal: Strength & Muscle Tone: Increased Gait & Station: Unable to assess, lying in bed, concerned about falling. Patient leans: n/a            Psychiatric Specialty Exam:  Presentation  General Appearance: Disheveled  Eye Contact:Fleeting  Speech:Slow  Speech Volume:Decreased  Handedness:Right   Mood and Affect  Mood:Anxious; Depressed  Affect:Flat   Thought Process  Thought Processes:Coherent  Descriptions of Associations:Circumstantial  Orientation:Full (Time, Place and Person)  Thought Content:Logical  History of Schizophrenia/Schizoaffective disorder:No  Duration of Psychotic Symptoms:N/A  Hallucinations:No data recorded Ideas of Reference:None  Suicidal Thoughts:No data recorded Homicidal Thoughts:No data recorded  Sensorium  Memory:Immediate Poor; Remote Good  Judgment:Fair  Insight:Fair   Executive Functions  Concentration:Fair  Attention Span:Fair  [place]   Psychomotor Activity  Psychomotor Activity:No data recorded  Assets  Assets:Communication Skills; Desire for Improvement   Sleep  Sleep:No data recorded  Physical Exam: Physical Exam ROS Blood pressure (!) 134/92, pulse 65, temperature 98 F (36.7 C), temperature source Oral, resp. rate 18, height '5\' 8"'$  (1.727 m), weight 84.4 kg, SpO2 94 %. Body mass index is 28.28 kg/m.  Treatment Plan Summary: Daily contact with patient to assess and evaluate symptoms and progress in treatment, Medication management, and Plan Will resume lithium tomorrow at 300 mg p.o. twice daily. -Patient on multiple serotonergic agents, that can result in increase in lithium levels.  Will discontinue venlafaxine XR 75 mg at this time. -Patient is under [facility] of [facility], will need to be notified of medication changes and discharge for appropriate transition of care. -Continue to repeat lithium level  every 6 to 8 hours x 2 cycles.  -Psychiatry will continue to follow at this time during hospital admission.  Patient does not meet inpatient psychiatric criteria.  Disposition: No evidence of imminent risk to self or others at present.   Patient does not meet criteria for psychiatric inpatient admission. Supportive therapy provided about ongoing stressors. Discussed crisis plan, support from social network, calling 911, coming to the Emergency Department, and calling Suicide Hotline.  Suella Broad, FNP 10/21/2021 9:05 AM

## 2021-10-22 MED ORDER — CYANOCOBALAMIN 1000 MCG/ML IJ SOLN
1000.0000 ug | Freq: Once | INTRAMUSCULAR | Status: AC
Start: 2021-10-22 — End: 2021-10-22
  Administered 2021-10-22: 1000 ug via INTRAMUSCULAR
  Filled 2021-10-22: qty 1

## 2021-10-22 NOTE — TOC Transition Note (Signed)
Transition of Care [facility]) - CM/SW Discharge Note   Patient Details  Name: Casey Silva MRN: [id number] Date of Birth: 03-Nov-1955  Transition of Care [facility]) CM/SW Contact:  Vassie Moselle, LCSW Phone Number: 10/22/2021, 12:16 PM   Clinical Narrative:    Met with pt to discuss recommendations for HHPT and RW. Pt declined having [place] set up and declined wanting RW ordered. Pt states he currently receives services form [facility] who provide psychiatric and case management services. Pt states his ACT Team assists him with getting his medications. TOC signing off at this time.    Final next level of care: Home/Self Care Barriers to Discharge: No Barriers Identified   Patient Goals and CMS Choice Patient states their goals for this hospitalization and ongoing recovery are:: To return home   Choice offered to / list presented to : Patient  Discharge Placement                       Discharge Plan and Services In-house Referral: Clinical Social Work Discharge Planning Services: CM Consult Post Acute Care Choice: NA          DME Arranged: N/A DME Agency: NA       HH Arranged: Refused HH          Social Determinants of Health (SDOH) Interventions     Readmission Risk Interventions     No data to display

## 2021-10-22 NOTE — Plan of Care (Signed)

## 2021-10-22 NOTE — Progress Notes (Signed)
PROGRESS NOTE    Casey Silva  VWU:[id number] DOB: [birth year] DOA: 10/20/2021 PCP: Cathleen Corti, PA-C   Brief Narrative:  66 y.o. male with medical history significant for severe depression, anxiety, and hypertension presented with fatigue, tremors and frequent falls.  On presentation, CT of the head was negative for acute intracranial abnormity.  Creatinine was 1.32, lithium level was 1.32.  Patient was started on IV fluids. Psychiatry was consulted.  Assessment & Plan:   Frequent falls -For falls in the last week.  Patient -No illicit substance use/alcohol abuse -Presented with worsening tremor, fatigue and ataxia -Lithium 1.32 on presentation and is currently on hold. -Fall precautions.  PT recommends home and PT versus SNF -MRI of brain showed no acute intracranial abnormality.  Prior hospitalist discussed with on-call neurology who recommended that patient's neurological symptoms possibly where because a consequence of supratherapeutic lithium levels; if patient's symptoms remain persistent with medical management of elevated lithium, will reengage neurology.  Otherwise outpatient neurology evaluation and follow-up.  Depression, anxiety -Psychiatry following.  Lithium level has normalized.  Lithium remains on hold.  Continue mirtazapine and trazodone.  Venlafaxine Exar has been discontinued by psychiatry.  Hypertension--continue amlodipine   DVT prophylaxis: Lovenox Code Status: Full Family Communication: None at bedside Disposition Plan: Status is: Observation The patient will require care spanning > 2 midnights and should be moved to inpatient because: Of severity of illness  Consultants: Psychiatry.  Prior hospitalist discussed with on-call neurologist on 10/21/2021  Procedures: None  Antimicrobials: None   Subjective: Patient seen and examined at bedside.  Poor historian.  Asking when he can go home.  No fever, seizures or agitation reported by nursing  staff.  Objective: Vitals:   10/21/21 1851 10/21/21 2226 10/22/21 0240 10/22/21 0317  BP: (!) 121/93 112/80 134/88   Pulse: 85 68 74   Resp: '18 16 18   '$ Temp: 98.6 F (37 C) 99 F (37.2 C) 98.6 F (37 C)   TempSrc: Oral Oral Oral   SpO2: 94% 100% 98%   Weight:    82.8 kg  Height:        Intake/Output Summary (Last 24 hours) at 10/22/2021 1150 Last data filed at 10/21/2021 1854 Gross per 24 hour  Intake 240 ml  Output --  Net 240 ml   Filed Weights   10/20/21 1040 10/21/21 1422 10/22/21 0317  Weight: 84.4 kg 84.3 kg 82.8 kg    Examination:  General exam: Appears calm and comfortable.  Awake, slow to respond.  Poor historian.  On room air currently. Respiratory system: Bilateral decreased breath sounds at bases Cardiovascular system: S1 & S2 heard, Rate controlled Gastrointestinal system: Abdomen is nondistended, soft and nontender. Normal bowel sounds heard. Extremities: No cyanosis, clubbing; trace lower extremity edema present   Data Reviewed: I have personally reviewed following labs and imaging studies  CBC: Recent Labs  Lab 10/20/21 1103 10/21/21 0550  WBC 8.5 8.4  HGB 15.0 13.8  HCT 44.7 41.1  MCV 88.0 88.4  PLT 304 295   Basic Metabolic Panel: Recent Labs  Lab 10/20/21 1103 10/21/21 0550  NA 136 137  K 3.5 3.5  CL 105 108  CO2 23 23  GLUCOSE 98 98  BUN 12 11  CREATININE 1.32* 1.05  CALCIUM 9.3 8.8*   GFR: Estimated Creatinine Clearance: 74.6 mL/min (by C-G formula based on SCr of 1.05 mg/dL). Liver Function Tests: No results for input(s): "AST", "ALT", "ALKPHOS", "BILITOT", "PROT", "ALBUMIN" in the last 168 hours. No results for  input(s): "LIPASE", "AMYLASE" in the last 168 hours. Recent Labs  Lab 10/20/21 2123  AMMONIA 27   Coagulation Profile: No results for input(s): "INR", "PROTIME" in the last 168 hours. Cardiac Enzymes: No results for input(s): "CKTOTAL", "CKMB", "CKMBINDEX", "TROPONINI" in the last 168 hours. BNP (last 3  results) No results for input(s): "PROBNP" in the last 8760 hours. HbA1C: No results for input(s): "HGBA1C" in the last 72 hours. CBG: Recent Labs  Lab 10/20/21 1113  GLUCAP 113*   Lipid Profile: No results for input(s): "CHOL", "HDL", "LDLCALC", "TRIG", "CHOLHDL", "LDLDIRECT" in the last 72 hours. Thyroid Function Tests: Recent Labs    10/20/21 2123  TSH 2.314   Anemia Panel: Recent Labs    10/20/21 2123  VITAMINB12 204   Sepsis Labs: No results for input(s): "PROCALCITON", "LATICACIDVEN" in the last 168 hours.  No results found for this or any previous visit (from the past 240 hour(s)).       Radiology Studies: MR BRAIN WO CONTRAST  Result Date: 10/21/2021 CLINICAL DATA:  66 year old male with persistent dizziness. Shaking. Lower extremity weakness. Frequent falls. EXAM: MRI HEAD WITHOUT CONTRAST TECHNIQUE: Multiplanar, multiecho pulse sequences of the brain and surrounding structures were obtained without intravenous contrast. COMPARISON:  Head CT 10/20/2021. FINDINGS: Brain: No restricted diffusion to suggest acute infarction. No midline shift, mass effect, evidence of mass lesion, ventriculomegaly, extra-axial collection or acute intracranial hemorrhage. Cervicomedullary junction and pituitary are within normal limits. Probably congenital sulcal variations rather than disproportionate cerebral volume loss along the right posterior cingulate sulcus and bilateral parietooccipital sulci. No discrete cortical encephalomalacia. No chronic cerebral blood products identified. Patchy mostly periatrial white matter T2 and FLAIR hyperintensity in both hemispheres is mild to moderate for age. Deep gray matter nuclei, brainstem and cerebellum are within normal limits for age. Vascular: Major intracranial vascular flow voids are preserved. Skull and upper cervical spine: Negative visible cervical spine for age. Visualized bone marrow signal is within normal limits. Sinuses/Orbits:  Negative. Other: Visible internal auditory structures appear normal. Mastoids are clear. Stylomastoid foramina appear normal. Negative visible scalp and face. IMPRESSION: 1. No acute intracranial abnormality. 2. Mild to moderate for age nonspecific cerebral white matter signal changes, most commonly due to chronic small vessel disease. Electronically Signed   By: Genevie Ann M.D.   On: 10/21/2021 06:35   CT HEAD WO CONTRAST (5MM)  Result Date: 10/20/2021 CLINICAL DATA:  Head trauma, moderate-severe.  Fall. EXAM: CT HEAD WITHOUT CONTRAST TECHNIQUE: Contiguous axial images were obtained from the base of the skull through the vertex without intravenous contrast. RADIATION DOSE REDUCTION: This exam was performed according to the departmental dose-optimization program which includes automated exposure control, adjustment of the mA and/or kV according to patient size and/or use of iterative reconstruction technique. COMPARISON:  09/24/2019 FINDINGS: Brain: No acute intracranial abnormality. Specifically, no hemorrhage, hydrocephalus, mass lesion, acute infarction, or significant intracranial injury. Vascular: No hyperdense vessel or unexpected calcification. Skull: No acute calvarial abnormality. Sinuses/Orbits: No acute findings Other: None IMPRESSION: No acute intracranial abnormality. Electronically Signed   By: Rolm Baptise M.D.   On: 10/20/2021 18:48   DG Chest Portable 1 View  Result Date: 10/20/2021 CLINICAL DATA:  Weakness EXAM: PORTABLE CHEST 1 VIEW COMPARISON:  None Available. FINDINGS: Heart is normal size. No confluent airspace opacities or effusions. No acute bony abnormality. IMPRESSION: No active disease. Electronically Signed   By: Rolm Baptise M.D.   On: 10/20/2021 18:47        Scheduled Meds:  amLODipine  10 mg Oral Daily   enoxaparin (LOVENOX) injection  40 mg Subcutaneous Q24H   famotidine  20 mg Oral Daily   mirtazapine  45 mg Oral QHS   sodium chloride flush  3 mL Intravenous Q12H    traZODone  100 mg Oral QHS   Continuous Infusions:        Aline August, MD [facility] 10/22/2021, 11:50 AM

## 2021-10-23 DIAGNOSIS — R296 Repeated falls: Secondary | ICD-10-CM | POA: Diagnosis not present

## 2021-10-23 DIAGNOSIS — E538 Deficiency of other specified B group vitamins: Secondary | ICD-10-CM

## 2021-10-23 LAB — LITHIUM LEVEL: Lithium Lvl: 0.23 mmol/L — ABNORMAL LOW (ref 0.60–1.20)

## 2021-10-23 MED ORDER — VITAMIN B-12 1000 MCG PO TABS
1000.0000 ug | ORAL_TABLET | Freq: Every day | ORAL | Status: DC
  Administered 2021-10-23 – 2021-10-24 (×2): 1000 ug via ORAL
  Filled 2021-10-23 (×2): qty 1

## 2021-10-23 MED ORDER — LITHIUM CARBONATE ER 300 MG PO TBCR
300.0000 mg | EXTENDED_RELEASE_TABLET | Freq: Two times a day (BID) | ORAL | Status: DC
  Administered 2021-10-23 – 2021-10-24 (×3): 300 mg via ORAL
  Filled 2021-10-23 (×3): qty 1

## 2021-10-23 NOTE — Consult Note (Signed)
[facility] Consult   Reason for Consult: Lithium toxicity Referring Physician: Dr. Aline August, MD Patient Identification: Casey Silva MRN:  [id number] Principal Diagnosis: Frequent falls Diagnosis:  Principal Problem:   Frequent falls Active Problems:   Benign essential hypertension   Depression with anxiety   B12 deficiency   Total Time spent with patient: 30 minutes  Subjective: '' I am doing pretty good''  Objective: Patient seen face to face. He is a 66 year old male with long history Bipolar disorder who was admitted with lithium toxicity-1.32 on admission. However, repeat lithium level on 10/21/21 was 0.72. Today, patient is alert, awake, cooperative and says he is feeling pretty good. He reports mild anxiety, worries but denies dizziness, memory lapses, apprehensions, psychosis, delusions, self harming thoughts, depressive symptoms and no obvious hand tremors.  Past Psychiatric History: Depression and anxiety.  [facility]; Dr. Clearence Ped. Recent history of ECT, ineffective. Recent inpatient hospitalization at [facility] in June 2023, patient was discharged on lithium 450 mg p.o. twice daily every 12 hours, mirtazapine 45 mg p.o. nightly, quetiapine 300 mg p.o. nightly, trazodone 50 mg p.o. nightly, venlafaxine XR 75 mg p.o. every morning.  Risk to Self: Denies currently; last had suicidal thoughts in June 2023 Risk to Others: Denies Prior Inpatient Therapy: Multiple inpatient admissions, last admission in June 2023 Prior Outpatient Therapy: [facility], Eulis Canner at [facility]  Past Medical History:  Past Medical History:  Diagnosis Date   Anxiety    Asthma    Depression    History reviewed. No pertinent surgical history. Family History:  Family History  Problem Relation Age of Onset   Bipolar disorder Mother    Family Psychiatric  History:  Social History:  Social  History   Substance and Sexual Activity  Alcohol Use No     Social History   Substance and Sexual Activity  Drug Use No    Social History   Socioeconomic History   Marital status: Single    Spouse name: Not on file   Number of children: 1   Years of education: Not on file   Highest education level: GED or equivalent  Occupational History   Not on file  Tobacco Use   Smoking status: Never   Smokeless tobacco: Never  Vaping Use   Vaping Use: Unknown  Substance and Sexual Activity   Alcohol use: No   Drug use: No   Sexual activity: Not Currently  Other Topics Concern   Not on file  Social History Narrative   Marital status:  Single   Children: Daughter   Employment:  Unemployment. previous work for [employer].   Alcohol:  None   Drugs:  none   Exercise:  Regularly very   Religion:  Jewish   Social Determinants of Health   Financial Resource Strain: Medium Risk (10/02/2019)   Overall Financial Resource Strain (CARDIA)    Difficulty of Paying Living Expenses: Somewhat hard  Food Insecurity: No Food Insecurity (10/02/2019)   Hunger Vital Sign    Worried About Running Out of Food in the Last Year: Never true    Ran Out of Food in the Last Year: Never true  Transportation Needs: No Transportation Needs (10/02/2019)   PRAPARE - Hydrologist (Medical): No    Lack of Transportation (Non-Medical): No  Physical Activity: Insufficiently Active (10/02/2019)   Exercise Vital Sign    Days of Exercise per Week:  2 days    Minutes of Exercise per Session: 30 min  Stress: Stress Concern Present (10/02/2019)   [place]    Feeling of Stress : Rather much  Social Connections: Socially Isolated (10/02/2019)   Social Connection and Isolation Panel [NHANES]    Frequency of Communication with Friends and Family: Never    Frequency of Social Gatherings with Friends and Family: Never     Attends Religious Services: Never    Marine scientist or Organizations: No    Attends Archivist Meetings: Never    Marital Status: Divorced   Additional Social History:    Allergies:   Allergies  Allergen Reactions   Dust Mite Mixed Allergen Ext [Mite (D. Farinae)] Shortness Of Breath   Bee Pollen Other (See Comments)    Watery Eyes    Labs:  No results found for this or any previous visit (from the past 48 hour(s)).   Current Facility-Administered Medications  Medication Dose Route Frequency Provider Last Rate Last Admin   acetaminophen (TYLENOL) tablet 650 mg  650 mg Oral Q6H PRN Opyd, Ilene Qua, MD       Or   acetaminophen (TYLENOL) suppository 650 mg  650 mg Rectal Q6H PRN Opyd, Ilene Qua, MD       amLODipine (NORVASC) tablet 10 mg  10 mg Oral Daily Opyd, Ilene Qua, MD   10 mg at 10/23/21 1013   enoxaparin (LOVENOX) injection 40 mg  40 mg Subcutaneous Q24H Opyd, Ilene Qua, MD   40 mg at 10/22/21 2157   famotidine (PEPCID) tablet 20 mg  20 mg Oral Daily Opyd, Ilene Qua, MD   20 mg at 10/23/21 1013   mirtazapine (REMERON) tablet 45 mg  45 mg Oral QHS Opyd, Ilene Qua, MD   45 mg at 10/22/21 2156   ondansetron (ZOFRAN) tablet 4 mg  4 mg Oral Q6H PRN Opyd, Ilene Qua, MD       Or   ondansetron (ZOFRAN) injection 4 mg  4 mg Intravenous Q6H PRN Opyd, Ilene Qua, MD       senna-docusate (Senokot-S) tablet 1 tablet  1 tablet Oral QHS PRN Opyd, Ilene Qua, MD   1 tablet at 10/21/21 2145   sodium chloride flush (NS) 0.9 % injection 3 mL  3 mL Intravenous Q12H Opyd, Ilene Qua, MD   3 mL at 10/23/21 1013   traZODone (DESYREL) tablet 100 mg  100 mg Oral QHS Opyd, Ilene Qua, MD   100 mg at 10/22/21 2156   vitamin B-12 (CYANOCOBALAMIN) tablet 1,000 mcg  1,000 mcg Oral Daily Aline August, MD   1,000 mcg at 10/23/21 1205    Musculoskeletal: Strength & Muscle Tone: Increased Gait & Station: Unable to assess, lying in bed, concerned about falling. Patient leans:  n/a     Psychiatric Specialty Exam:  Presentation  General Appearance: Disheveled  Eye Contact:Fleeting  Speech:Slow  Speech Volume:Decreased  Handedness:Right   Mood and Affect  Mood:Anxious; Depressed  Affect:Flat   Thought Process  Thought Processes:Coherent  Descriptions of Associations:Circumstantial  Orientation:Full (Time, Place and Person)  Thought Content:Logical  History of Schizophrenia/Schizoaffective disorder:No  Duration of Psychotic Symptoms:N/A  Hallucinations:No data recorded Ideas of Reference:None  Suicidal Thoughts:No data recorded Homicidal Thoughts:No data recorded  Sensorium  Memory:Immediate Poor; Remote Good  Judgment:Fair  Insight:Fair   Executive Functions  Concentration:Fair  Attention Span:Fair  [place]   Psychomotor Activity  Psychomotor Activity:No  data recorded  Assets  Assets:Communication Skills; Desire for Improvement   Sleep  Sleep:No data recorded  Physical Exam: Physical Exam ROS Blood pressure (!) 123/97, pulse 74, temperature 98 F (36.7 C), temperature source Oral, resp. rate 20, height '5\' 8"'$  (1.727 m), weight 82.7 kg, SpO2 96 %. Body mass index is 27.72 kg/m.  Treatment Plan Summary:  Plan/Recommendation: 66 year old male with history of bipolar depression who was admitted due to Lithium toxicity which seems to have resolved. Most recent Lithium level was 0.72 which is below therapeutic level. Will consider restarting Lithium at low dose at this point.  Plan/Recommendations: -Start Lithium 300 mg twice daily for Bipolar disorder -Patient is under the care of [facility], will need to be notified of medication changes and discharge for appropriate transition of care. -Check Lithium level before patient is discharged home. -Psychiatry will continue to follow at this time during hospital admission.   Disposition: No evidence  of imminent risk to self or others at present.   Patient does not meet criteria for psychiatric inpatient admission. Supportive therapy provided about ongoing stressors.  Corena Pilgrim, MD 10/23/2021 12:46 PM

## 2021-10-23 NOTE — Plan of Care (Signed)
  Problem: Clinical Measurements: Goal: Diagnostic test results will improve Outcome: Progressing   Problem: Nutrition: Goal: Adequate nutrition will be maintained Outcome: Progressing   Problem: Pain Managment: Goal: General experience of comfort will improve Outcome: Progressing   

## 2021-10-23 NOTE — Progress Notes (Addendum)
PROGRESS NOTE    Casey Silva  LFY:[id number] DOB: 06/14/1955 DOA: 10/20/2021 PCP: Cathleen Corti, PA-C   Brief Narrative:  66 y.o. male with medical history significant for severe depression, anxiety, and hypertension presented with fatigue, tremors and frequent falls.  On presentation, CT of the head was negative for acute intracranial abnormity.  Creatinine was 1.32, lithium level was 1.32.  Patient was started on IV fluids. Psychiatry was consulted.  Assessment & Plan:   Frequent falls -For falls in the last week.  Patient -No illicit substance use/alcohol abuse -Presented with worsening tremor, fatigue and ataxia -Lithium 1.32 on presentation and is currently on hold. -Fall precautions.  PT recommends home and PT versus SNF -MRI of brain showed no acute intracranial abnormality.  Prior hospitalist discussed with on-call neurology who recommended that patient's neurological symptoms possibly where because a consequence of supratherapeutic lithium levels; if patient's symptoms remain persistent with medical management of elevated lithium, will reengage neurology.  Otherwise outpatient neurology evaluation and follow-up.  Depression, anxiety -Psychiatry following.  Lithium level has normalized.  Lithium remains on hold.  Continue mirtazapine and trazodone.  Venlafaxine has been discontinued by psychiatry.  Hypertension--continue amlodipine  Possible B12 deficiency -B12 204.  Supplementation has been started.   DVT prophylaxis: Lovenox Code Status: Full Family Communication: None at bedside Disposition Plan: Status is: Observation The patient will require care spanning > 2 midnights and should be moved to inpatient because: Of severity of illness  Consultants: Psychiatry.  Prior hospitalist discussed with on-call neurologist on 10/21/2021  Procedures: None  Antimicrobials: None   Subjective: Patient seen and examined at bedside.  Poor historian.  Complains of some back  pain.  No agitation, seizures, vomiting reported.  Objective: Vitals:   10/22/21 1730 10/22/21 1942 10/23/21 0500 10/23/21 0513  BP: 124/82 123/88  109/75  Pulse: 70 77  77  Resp: '19 19  18  '$ Temp: 98.9 F (37.2 C) 98.5 F (36.9 C)  98.1 F (36.7 C)  TempSrc: Oral Oral  Oral  SpO2: 99% 98%  93%  Weight:   82.7 kg   Height:       No intake or output data in the 24 hours ending 10/23/21 1042  Filed Weights   10/21/21 1422 10/22/21 0317 10/23/21 0500  Weight: 84.3 kg 82.8 kg 82.7 kg    Examination:  General exam: Still slow to respond.  No distress.  Currently on room air.  Poor historian.   Respiratory system: Decreased breath sounds at bases bilaterally, no wheezing cardiovascular system: Rate mostly controlled; S1 and S2 are heard gastrointestinal system: Abdomen is distended moderately, soft and nontender.  Bowel sounds are heard Extremities: Mild lower extremity edema present; no clubbing  Data Reviewed: I have personally reviewed following labs and imaging studies  CBC: Recent Labs  Lab 10/20/21 1103 10/21/21 0550  WBC 8.5 8.4  HGB 15.0 13.8  HCT 44.7 41.1  MCV 88.0 88.4  PLT 304 852    Basic Metabolic Panel: Recent Labs  Lab 10/20/21 1103 10/21/21 0550  NA 136 137  K 3.5 3.5  CL 105 108  CO2 23 23  GLUCOSE 98 98  BUN 12 11  CREATININE 1.32* 1.05  CALCIUM 9.3 8.8*    GFR: Estimated Creatinine Clearance: 74.5 mL/min (by C-G formula based on SCr of 1.05 mg/dL). Liver Function Tests: No results for input(s): "AST", "ALT", "ALKPHOS", "BILITOT", "PROT", "ALBUMIN" in the last 168 hours. No results for input(s): "LIPASE", "AMYLASE" in the last 168 hours. Recent  Labs  Lab 10/20/21 2123  AMMONIA 27    Coagulation Profile: No results for input(s): "INR", "PROTIME" in the last 168 hours. Cardiac Enzymes: No results for input(s): "CKTOTAL", "CKMB", "CKMBINDEX", "TROPONINI" in the last 168 hours. BNP (last 3 results) No results for input(s): "PROBNP" in  the last 8760 hours. HbA1C: No results for input(s): "HGBA1C" in the last 72 hours. CBG: Recent Labs  Lab 10/20/21 1113  GLUCAP 113*    Lipid Profile: No results for input(s): "CHOL", "HDL", "LDLCALC", "TRIG", "CHOLHDL", "LDLDIRECT" in the last 72 hours. Thyroid Function Tests: Recent Labs    10/20/21 2123  TSH 2.314    Anemia Panel: Recent Labs    10/20/21 2123  VITAMINB12 204    Sepsis Labs: No results for input(s): "PROCALCITON", "LATICACIDVEN" in the last 168 hours.  No results found for this or any previous visit (from the past 240 hour(s)).       Radiology Studies: No results found.      Scheduled Meds:  amLODipine  10 mg Oral Daily   enoxaparin (LOVENOX) injection  40 mg Subcutaneous Q24H   famotidine  20 mg Oral Daily   mirtazapine  45 mg Oral QHS   sodium chloride flush  3 mL Intravenous Q12H   traZODone  100 mg Oral QHS   Continuous Infusions:        Aline August, MD [facility] 10/23/2021, 10:42 AM

## 2021-10-24 DIAGNOSIS — R296 Repeated falls: Secondary | ICD-10-CM | POA: Diagnosis not present

## 2021-10-24 DIAGNOSIS — E538 Deficiency of other specified B group vitamins: Secondary | ICD-10-CM

## 2021-10-24 MED ORDER — LITHIUM CARBONATE ER 300 MG PO TBCR
300.0000 mg | EXTENDED_RELEASE_TABLET | Freq: Two times a day (BID) | ORAL | Status: DC
Start: 2021-10-24 — End: 2021-11-03

## 2021-10-24 MED ORDER — CYANOCOBALAMIN 1000 MCG PO TABS: 1000.0000 ug | ORAL_TABLET | Freq: Every day | ORAL | 0 refills | Status: AC

## 2021-10-24 NOTE — Discharge Summary (Signed)
Physician Discharge Summary  Casey Silva SAY:[id number] DOB: 12/23/1955 DOA: 10/20/2021  PCP: Cathleen Corti, PA-C  Admit date: 10/20/2021 Discharge date: 10/24/2021  Admitted From: Boarding house Disposition: Boarding house  Recommendations for Outpatient Follow-up:  Follow up with PCP in 1 week Outpatient follow-up with psychiatry Outpatient evaluation and follow-up by neurology if needed Follow up in ED if symptoms worsen or new appear   Home Health: Home health PT.  Patient refused it. Equipment/Devices: None  Discharge Condition: Stable CODE STATUS: Full Diet recommendation: Heart healthy  Brief/Interim Summary: 66 y.o. male with medical history significant for severe depression, anxiety, and hypertension presented with fatigue, tremors and frequent falls.  On presentation, CT of the head was negative for acute intracranial abnormity.  Creatinine was 1.32, lithium level was 1.32.  Patient was started on IV fluids. Psychiatry was consulted.  During the hospitalization, his condition has improved.  Psychiatry has resumed his lithium at a lower dose.  He has been started on vitamin B12 supplementation.  He will be discharged back to his boarding house today.  Discharge Diagnoses:   Frequent falls -For falls in the last week.  Patient -No illicit substance use/alcohol abuse -Presented with worsening tremor, fatigue and ataxia -Lithium 1.32 on presentation and was initially held. -Fall precautions.  PT recommends home health PT versus SNF.  Patient refused home health PT. -MRI of brain showed no acute intracranial abnormality.  Prior hospitalist discussed with on-call neurology who recommended that patient's neurological symptoms possibly where because a consequence of supratherapeutic lithium levels; if patient's symptoms remain persistent with medical management of elevated lithium, will reengage neurology.  This can be done as an outpatient. -Psychiatry has resumed lithium from  10/23/2021 at a lower dose of 300 mg twice a day and recommending that the patient be discharged on the same dose.  Psychiatry mentions that this has been communicated to the ACT team who will take care of the lithium on discharge. -Patient will be discharged today.  Outpatient follow-up with PCP/psychiatry.   Depression, anxiety -Psychiatry following.  Lithium level has normalized.  Lithium plans as above.  Continue mirtazapine and trazodone.  Venlafaxine/sertraline have been discontinued by psychiatry.   Hypertension--continue amlodipine   Possible B12 deficiency -B12 204.  Supplementation has been started which will be continued on discharge.  Discharge Instructions  Discharge Instructions     Diet - low sodium heart healthy   Complete by: As directed    Increase activity slowly   Complete by: As directed       Allergies as of 10/24/2021       Reactions   Dust Mite Mixed Allergen Ext [mite (d. Farinae)] Shortness Of Breath   Bee Pollen Other (See Comments)   Watery Eyes        Medication List     STOP taking these medications    hydrOXYzine 25 MG tablet Commonly known as: ATARAX   lidocaine 5 % Commonly known as: LIDODERM   QUEtiapine 300 MG tablet Commonly known as: SEROQUEL   sertraline 50 MG tablet Commonly known as: ZOLOFT   venlafaxine XR 75 MG 24 hr capsule Commonly known as: EFFEXOR-XR       TAKE these medications    amLODipine 10 MG tablet Commonly known as: NORVASC Take 1 tablet (10 mg total) by mouth daily.   cyanocobalamin 1000 MCG tablet Take 1 tablet (1,000 mcg total) by mouth daily. Start taking on: October 25, 2021   famotidine 20 MG tablet Commonly known as: PEPCID  Take 1 tablet (20 mg total) by mouth daily.   lithium carbonate 300 MG CR tablet Commonly known as: LITHOBID Take 1 tablet (300 mg total) by mouth every 12 (twelve) hours. What changed:  medication strength how much to take   mirtazapine 45 MG tablet Commonly known  as: REMERON Take 1 tablet (45 mg total) by mouth at bedtime.   traZODone 100 MG tablet Commonly known as: DESYREL Take 100 mg by mouth at bedtime. What changed: Another medication with the same name was removed. Continue taking this medication, and follow the directions you see here.        Follow-up Information     Cathleen Corti, PA-C. Schedule an appointment as soon as possible for a visit in 1 week(s).   Specialty: Physician Assistant Contact information: [place] [address] [phone]         psychiatrist. Schedule an appointment as soon as possible for a visit in 1 week(s).                 Allergies  Allergen Reactions   Dust Mite Mixed Allergen Ext [Mite (D. Farinae)] Shortness Of Breath   Bee Pollen Other (See Comments)    Watery Eyes    Consultations: Psychiatry.  Prior hospitalist discussed with on-call neurologist on 10/21/2021   Procedures/Studies: MR BRAIN WO CONTRAST  Result Date: 10/21/2021 CLINICAL DATA:  66 year old male with persistent dizziness. Shaking. Lower extremity weakness. Frequent falls. EXAM: MRI HEAD WITHOUT CONTRAST TECHNIQUE: Multiplanar, multiecho pulse sequences of the brain and surrounding structures were obtained without intravenous contrast. COMPARISON:  Head CT 10/20/2021. FINDINGS: Brain: No restricted diffusion to suggest acute infarction. No midline shift, mass effect, evidence of mass lesion, ventriculomegaly, extra-axial collection or acute intracranial hemorrhage. Cervicomedullary junction and pituitary are within normal limits. Probably congenital sulcal variations rather than disproportionate cerebral volume loss along the right posterior cingulate sulcus and bilateral parietooccipital sulci. No discrete cortical encephalomalacia. No chronic cerebral blood products identified. Patchy mostly periatrial white matter T2 and FLAIR hyperintensity in both hemispheres is mild to moderate for age. Deep gray  matter nuclei, brainstem and cerebellum are within normal limits for age. Vascular: Major intracranial vascular flow voids are preserved. Skull and upper cervical spine: Negative visible cervical spine for age. Visualized bone marrow signal is within normal limits. Sinuses/Orbits: Negative. Other: Visible internal auditory structures appear normal. Mastoids are clear. Stylomastoid foramina appear normal. Negative visible scalp and face. IMPRESSION: 1. No acute intracranial abnormality. 2. Mild to moderate for age nonspecific cerebral white matter signal changes, most commonly due to chronic small vessel disease. Electronically Signed   By: Genevie Ann M.D.   On: 10/21/2021 06:35   CT HEAD WO CONTRAST (5MM)  Result Date: 10/20/2021 CLINICAL DATA:  Head trauma, moderate-severe.  Fall. EXAM: CT HEAD WITHOUT CONTRAST TECHNIQUE: Contiguous axial images were obtained from the base of the skull through the vertex without intravenous contrast. RADIATION DOSE REDUCTION: This exam was performed according to the departmental dose-optimization program which includes automated exposure control, adjustment of the mA and/or kV according to patient size and/or use of iterative reconstruction technique. COMPARISON:  09/24/2019 FINDINGS: Brain: No acute intracranial abnormality. Specifically, no hemorrhage, hydrocephalus, mass lesion, acute infarction, or significant intracranial injury. Vascular: No hyperdense vessel or unexpected calcification. Skull: No acute calvarial abnormality. Sinuses/Orbits: No acute findings Other: None IMPRESSION: No acute intracranial abnormality. Electronically Signed   By: Rolm Baptise M.D.   On: 10/20/2021 18:48   DG Chest Portable  1 View  Result Date: 10/20/2021 CLINICAL DATA:  Weakness EXAM: PORTABLE CHEST 1 VIEW COMPARISON:  None Available. FINDINGS: Heart is normal size. No confluent airspace opacities or effusions. No acute bony abnormality. IMPRESSION: No active disease. Electronically Signed    By: Rolm Baptise M.D.   On: 10/20/2021 18:47      Subjective: Patient seen and examined at bedside.  Poor historian.  No seizures, vomiting, agitation reported.  Feels okay to be discharged today.  Discharge Exam: Vitals:   10/24/21 0433 10/24/21 0935  BP: 126/81 (!) 147/78  Pulse: 74   Resp: 16   Temp: 98 F (36.7 C)   SpO2: 98%     General: Pt is alert, awake, not in acute distress.  Currently on room air.  Looks chronically ill and deconditioned.  Slow to respond.  Poor historian. Cardiovascular: rate controlled, S1/S2 + Respiratory: bilateral decreased breath sounds at bases Abdominal: Soft, NT, ND, bowel sounds + Extremities: Trace lower extremity edema; no cyanosis    The results of significant diagnostics from this hospitalization (including imaging, microbiology, ancillary and laboratory) are listed below for reference.     Microbiology: No results found for this or any previous visit (from the past 240 hour(s)).   Labs: BNP (last 3 results) No results for input(s): "BNP" in the last 8760 hours. Basic Metabolic Panel: Recent Labs  Lab 10/20/21 1103 10/21/21 0550  NA 136 137  K 3.5 3.5  CL 105 108  CO2 23 23  GLUCOSE 98 98  BUN 12 11  CREATININE 1.32* 1.05  CALCIUM 9.3 8.8*   Liver Function Tests: No results for input(s): "AST", "ALT", "ALKPHOS", "BILITOT", "PROT", "ALBUMIN" in the last 168 hours. No results for input(s): "LIPASE", "AMYLASE" in the last 168 hours. Recent Labs  Lab 10/20/21 2123  AMMONIA 27   CBC: Recent Labs  Lab 10/20/21 1103 10/21/21 0550  WBC 8.5 8.4  HGB 15.0 13.8  HCT 44.7 41.1  MCV 88.0 88.4  PLT 304 269   Cardiac Enzymes: No results for input(s): "CKTOTAL", "CKMB", "CKMBINDEX", "TROPONINI" in the last 168 hours. BNP: Invalid input(s): "POCBNP" CBG: Recent Labs  Lab 10/20/21 1113  GLUCAP 113*   D-Dimer No results for input(s): "DDIMER" in the last 72 hours. Hgb A1c No results for input(s): "HGBA1C" in the  last 72 hours. Lipid Profile No results for input(s): "CHOL", "HDL", "LDLCALC", "TRIG", "CHOLHDL", "LDLDIRECT" in the last 72 hours. Thyroid function studies No results for input(s): "TSH", "T4TOTAL", "T3FREE", "THYROIDAB" in the last 72 hours.  Invalid input(s): "FREET3" Anemia work up No results for input(s): "VITAMINB12", "FOLATE", "FERRITIN", "TIBC", "IRON", "RETICCTPCT" in the last 72 hours. Urinalysis    Component Value Date/Time   COLORURINE YELLOW 10/20/2021 2259   APPEARANCEUR CLEAR 10/20/2021 2259   LABSPEC 1.018 10/20/2021 2259   PHURINE 6.0 10/20/2021 2259   GLUCOSEU NEGATIVE 10/20/2021 2259   HGBUR SMALL (A) 10/20/2021 2259   BILIRUBINUR NEGATIVE 10/20/2021 2259   KETONESUR 5 (A) 10/20/2021 2259   PROTEINUR NEGATIVE 10/20/2021 2259   UROBILINOGEN 0.2 03/08/2010 1146   NITRITE NEGATIVE 10/20/2021 2259   LEUKOCYTESUR NEGATIVE 10/20/2021 2259   Sepsis Labs Recent Labs  Lab 10/20/21 1103 10/21/21 0550  WBC 8.5 8.4   Microbiology No results found for this or any previous visit (from the past 240 hour(s)).   Time coordinating discharge: 35 minutes  SIGNED:   Aline August, MD  [facility] 10/24/2021, 9:45 AM

## 2021-10-24 NOTE — Consult Note (Signed)
[place] Psychiatry Consult   Reason for Consult: Lithium toxicity Referring Physician: Dr. Aline August, MD Patient Identification: Casey Silva MRN:  [id number] Principal Diagnosis: Frequent falls Diagnosis:  Principal Problem:   Frequent falls Active Problems:   Benign essential hypertension   Depression with anxiety   B12 deficiency   Total Time spent with patient: 30 minutes  Subjective: '' Iam ready to go home.''  Objective: Patient seen and reassessed by this nurse practitioner, case discussed with Attending. Patient originally admitted for Lithium toxicity, 1.32-->0.72 --> 0.23 yesterday. Lithium was resumed.   On evaluation patient is alert and oriented, calm and cooperative, very pleasant upon approach.  He is noted to be lying in bed, waiting to be seen for discharge.   Patient denies any access to weapons, denies any alcohol and or substance abuse.  He reports moderate sleep and fair appetite.  He also is receiving services through [facility], and are available to assist with medication management in which he reports compliance with most of his appointments.  Patient denies any auditory and/or visual hallucinations, does not appear to be responding to internal or external stimuli.  There is no evidence of delusional thought content and patient appears to answer all questions appropriately.  At this time patient appears to be stable to discharge home, with support system services in place.     Past Psychiatric History: Depression and anxiety.  [facility]; Dr. Clearence Ped. Recent history of ECT, ineffective. Recent inpatient hospitalization at [facility] in June 2023, patient was discharged on lithium 450 mg p.o. twice daily every 12 hours, mirtazapine 45 mg p.o. nightly, quetiapine 300 mg p.o. nightly, trazodone 50 mg p.o. nightly, venlafaxine XR 75 mg p.o. every morning.  Risk to Self: Denies currently; last had suicidal thoughts in June  2023 Risk to Others: Denies Prior Inpatient Therapy: Multiple inpatient admissions, last admission in June 2023 Prior Outpatient Therapy: [facility], Eulis Canner at [facility]  Past Medical History:  Past Medical History:  Diagnosis Date   Anxiety    Asthma    Depression    History reviewed. No pertinent surgical history. Family History:  Family History  Problem Relation Age of Onset   Bipolar disorder Mother    Family Psychiatric  History:  Social History:  Social History   Substance and Sexual Activity  Alcohol Use No     Social History   Substance and Sexual Activity  Drug Use No    Social History   Socioeconomic History   Marital status: Single    Spouse name: Not on file   Number of children: 1   Years of education: Not on file   Highest education level: GED or equivalent  Occupational History   Not on file  Tobacco Use   Smoking status: Never   Smokeless tobacco: Never  Vaping Use   Vaping Use: Unknown  Substance and Sexual Activity   Alcohol use: No   Drug use: No   Sexual activity: Not Currently  Other Topics Concern   Not on file  Social History Narrative   Marital status:  Single   Children: Daughter   Employment:  Unemployment. previous work for [employer].   Alcohol:  None   Drugs:  none   Exercise:  Regularly very   Religion:  Jewish   Social Determinants of Health   Financial Resource Strain: Medium Risk (10/02/2019)   Overall Financial Resource Strain (CARDIA)  Difficulty of Paying Living Expenses: Somewhat hard  Food Insecurity: No Food Insecurity (10/02/2019)   Hunger Vital Sign    Worried About Running Out of Food in the Last Year: Never true    Ran Out of Food in the Last Year: Never true  Transportation Needs: No Transportation Needs (10/02/2019)   PRAPARE - Hydrologist (Medical): No    Lack of Transportation (Non-Medical): No  Physical  Activity: Insufficiently Active (10/02/2019)   Exercise Vital Sign    Days of Exercise per Week: 2 days    Minutes of Exercise per Session: 30 min  Stress: Stress Concern Present (10/02/2019)   [place]    Feeling of Stress : Rather much  Social Connections: Socially Isolated (10/02/2019)   Social Connection and Isolation Panel [NHANES]    Frequency of Communication with Friends and Family: Never    Frequency of Social Gatherings with Friends and Family: Never    Attends Religious Services: Never    Marine scientist or Organizations: No    Attends Archivist Meetings: Never    Marital Status: Divorced   Additional Social History: Specify valuables returned: clothing  Allergies:   Allergies  Allergen Reactions   Dust Mite Mixed Allergen Ext [Mite (D. Farinae)] Shortness Of Breath   Bee Pollen Other (See Comments)    Watery Eyes    Labs:  Results for orders placed or performed during the hospital encounter of 10/20/21 (from the past 48 hour(s))  Lithium level     Status: Abnormal   Collection Time: 10/23/21 11:09 AM  Result Value Ref Range   Lithium Lvl 0.23 (L) 0.60 - 1.20 mmol/L    Comment: Performed at [facility], [place] [address], [place], [place] [address]     Current Facility-Administered Medications  Medication Dose Route Frequency Provider Last Rate Last Admin   acetaminophen (TYLENOL) tablet 650 mg  650 mg Oral Q6H PRN Opyd, Ilene Qua, MD       Or   acetaminophen (TYLENOL) suppository 650 mg  650 mg Rectal Q6H PRN Opyd, Ilene Qua, MD       amLODipine (NORVASC) tablet 10 mg  10 mg Oral Daily Opyd, Ilene Qua, MD   10 mg at 10/24/21 0935   enoxaparin (LOVENOX) injection 40 mg  40 mg Subcutaneous Q24H Opyd, Ilene Qua, MD   40 mg at 10/23/21 2134   famotidine (PEPCID) tablet 20 mg  20 mg Oral Daily Opyd, Ilene Qua, MD   20 mg at 10/24/21 0935   lithium carbonate  (LITHOBID) CR tablet 300 mg  300 mg Oral Q12H Akintayo, Mojeed, MD   300 mg at 10/24/21 0935   mirtazapine (REMERON) tablet 45 mg  45 mg Oral QHS Opyd, Ilene Qua, MD   45 mg at 10/23/21 2134   ondansetron (ZOFRAN) tablet 4 mg  4 mg Oral Q6H PRN Opyd, Ilene Qua, MD       Or   ondansetron (ZOFRAN) injection 4 mg  4 mg Intravenous Q6H PRN Opyd, Ilene Qua, MD       senna-docusate (Senokot-S) tablet 1 tablet  1 tablet Oral QHS PRN Opyd, Ilene Qua, MD   1 tablet at 10/21/21 2145   sodium chloride flush (NS) 0.9 % injection 3 mL  3 mL Intravenous Q12H Opyd, Ilene Qua, MD   3 mL at 10/23/21 2136   traZODone (DESYREL) tablet 100 mg  100 mg Oral  QHS Vianne Bulls, MD   100 mg at 10/23/21 2134   vitamin B-12 (CYANOCOBALAMIN) tablet 1,000 mcg  1,000 mcg Oral Daily Aline August, MD   1,000 mcg at 10/24/21 0935   Current Outpatient Medications  Medication Sig Dispense Refill   amLODipine (NORVASC) 10 MG tablet Take 1 tablet (10 mg total) by mouth daily.     famotidine (PEPCID) 20 MG tablet Take 1 tablet (20 mg total) by mouth daily.     mirtazapine (REMERON) 45 MG tablet Take 1 tablet (45 mg total) by mouth at bedtime.     traZODone (DESYREL) 100 MG tablet Take 100 mg by mouth at bedtime.     lithium carbonate (LITHOBID) 300 MG CR tablet Take 1 tablet (300 mg total) by mouth every 12 (twelve) hours.     [START ON 10/25/2021] vitamin B-12 1000 MCG tablet Take 1 tablet (1,000 mcg total) by mouth daily. 30 tablet 0    Musculoskeletal: Strength & Muscle Tone: Increased Gait & Station: Unable to assess, lying in bed, concerned about falling. Patient leans: n/a     Psychiatric Specialty Exam:  Presentation  General Appearance: Appropriate for Environment; Casual  Eye Contact:Fair  Speech:Clear and Coherent; Normal Rate  Speech Volume:Normal  Handedness:Right   Mood and Affect  Mood:Euthymic  Affect:Appropriate; Congruent   Thought Process  Thought Processes:Coherent;  Linear  Descriptions of Associations:Intact  Orientation:Full (Time, Place and Person)  Thought Content:Logical  History of Schizophrenia/Schizoaffective disorder:No  Duration of Psychotic Symptoms:N/A  Hallucinations:Hallucinations: None Ideas of Reference:None  Suicidal Thoughts:Suicidal Thoughts: No Homicidal Thoughts:Homicidal Thoughts: No  Sensorium  Memory:Immediate Fair; Recent Fair; Remote Fair  Judgment:Fair  Insight:Fair   Executive Functions  Concentration:Fair  Attention Span:Good  [place] of Knowledge:Good  Language:Good   Psychomotor Activity  Psychomotor Activity:Psychomotor Activity: Normal  Assets  Assets:Communication Skills; Desire for Improvement; Financial Resources/Insurance; Resilience; Leisure Time   Sleep  Sleep:Sleep: Fair  Physical Exam: Physical Exam ROS Blood pressure (!) 147/78, pulse 74, temperature 98 F (36.7 C), resp. rate 16, height '5\' 8"'$  (1.727 m), weight 81.7 kg, SpO2 98 %. Body mass index is 27.39 kg/m.  Treatment Plan Summary:  Plan/Recommendation: 66 year old male with history of bipolar depression who was admitted due to Lithium toxicity which seems to have resolved. Most recent Lithium level was 0.72 which is below therapeutic level. Will consider restarting Lithium at low dose at this point.  Plan/Recommendations: -Continue Lithium 300 mg twice daily for Bipolar disorder -[facility], spoke with Ronalee Belts from the Act team at [phone]. Julio Sicks repeat Lithium level is due on Friday 07/21, also advised of medication changes to include d/c Effexor.   Psych cleared at this time.   Disposition: No evidence of imminent risk to self or others at present.   Patient does not meet criteria for psychiatric inpatient admission. Supportive therapy provided about ongoing stressors.  Suella Broad, FNP 10/24/2021 3:22 PM

## 2021-10-24 NOTE — Plan of Care (Signed)

## 2021-10-24 NOTE — Progress Notes (Signed)
Order to discharge pt home.  Discharge instructions/AVS given to patient and reviewed - education provided as needed.  Pt advised to call PCP and/or come back to the hospital if there are any problems. Pt verbalized understanding.    

## 2021-10-25 LAB — FOLATE RBC
Folate, Hemolysate: 399 ng/mL
Folate, RBC: 1013 ng/mL (ref >498)
Hematocrit: 39.4 % (ref 37.5–51.0)

## 2021-11-03 ENCOUNTER — Encounter (HOSPITAL_COMMUNITY): Payer: Self-pay | Admitting: Student in an Organized Health Care Education/Training Program

## 2021-11-03 ENCOUNTER — Other Ambulatory Visit: Payer: Self-pay

## 2021-11-03 ENCOUNTER — Ambulatory Visit (INDEPENDENT_AMBULATORY_CARE_PROVIDER_SITE_OTHER): Payer: No Payment, Other | Admitting: Student in an Organized Health Care Education/Training Program

## 2021-11-03 VITALS — BP 118/86 | HR 85 | Ht 68.0 in | Wt 190.0 lb

## 2021-11-03 DIAGNOSIS — F5101 Primary insomnia: Secondary | ICD-10-CM | POA: Diagnosis not present

## 2021-11-03 DIAGNOSIS — F331 Major depressive disorder, recurrent, moderate: Secondary | ICD-10-CM | POA: Diagnosis not present

## 2021-11-03 DIAGNOSIS — F411 Generalized anxiety disorder: Secondary | ICD-10-CM | POA: Diagnosis not present

## 2021-11-03 MED ORDER — QUETIAPINE FUMARATE 50 MG PO TABS
50.0000 mg | ORAL_TABLET | Freq: Every day | ORAL | 0 refills | Status: DC
  Filled 2021-11-03: qty 30, 30d supply, fill #0

## 2021-11-03 MED ORDER — LITHIUM CARBONATE ER 300 MG PO TBCR
300.0000 mg | EXTENDED_RELEASE_TABLET | Freq: Two times a day (BID) | ORAL | 0 refills | Status: DC
  Filled 2021-11-03: qty 60, 30d supply, fill #0

## 2021-11-03 MED ORDER — MIRTAZAPINE 45 MG PO TABS: 45.0000 mg | ORAL_TABLET | Freq: Every day | ORAL | Status: DC

## 2021-11-03 MED ORDER — MIRTAZAPINE 45 MG PO TABS
45.0000 mg | ORAL_TABLET | Freq: Every day | ORAL | 0 refills | Status: DC
  Filled 2021-11-03: qty 30, 30d supply, fill #0

## 2021-11-03 MED ORDER — TRAZODONE HCL 100 MG PO TABS
100.0000 mg | ORAL_TABLET | Freq: Every day | ORAL | 0 refills | Status: DC
  Filled 2021-11-03: qty 30, 30d supply, fill #0

## 2021-11-03 MED ORDER — LITHIUM CARBONATE ER 300 MG PO TBCR: 300.0000 mg | EXTENDED_RELEASE_TABLET | Freq: Two times a day (BID) | ORAL | Status: DC

## 2021-11-03 NOTE — Progress Notes (Signed)
Casey Silva  11/03/2021 11:56 AM Casey Silva  MRN:  [id number]  Chief Complaint: No chief complaint on file.  HPI:  Casey Silva is a 66 yr old male who presents for follow up care and medication management.  PPHx is significant for Brief Psychotic Disorder, MDD, and Insomnia, and multiple hospitalizations (latest [facility] 09/2021), failed ECT (07/2021), Hospitalized for Lithium toxicity (10/2021), was with [facility] (discontinued 10/2021).  He reports that he has been doing okay since his recent hospitalization for lithium toxicity.  He reports that they stopped his Seroquel and since then his sleep has worsened and he felt like his mood has been worse.  He reports that he is no longer with the ACT team as there have been inconsistent staff and issues seeing a provider.  He states he is dropped the ACT team and wants to resume seeing Dr. Ronne Binning here.  Discussed with him that we could restart the Seroquel but we would have to be very cautious given his recent lithium toxicity and concerns over multiple medications increasing serotonin.  Discussed we would have to start at a low dose and he would need close follow-up.  He was agreeable to this.  He reports no SI, HI, or AVH.  He reports his sleep is fair but not good.  He reports his appetite is okay.  He reports no other concerns at present.    Visit Diagnosis:    ICD-10-CM   1. Moderate episode of recurrent major depressive disorder (HCC)  F33.1 QUEtiapine (SEROQUEL) 50 MG tablet    mirtazapine (REMERON) 45 MG tablet    lithium carbonate (LITHOBID) 300 MG CR tablet    DISCONTINUED: lithium carbonate (LITHOBID) 300 MG CR tablet    DISCONTINUED: mirtazapine (REMERON) 45 MG tablet    2. GAD (generalized anxiety disorder)  F41.1 QUEtiapine (SEROQUEL) 50 MG tablet    mirtazapine (REMERON) 45 MG tablet    DISCONTINUED: mirtazapine (REMERON) 45 MG tablet    3. Primary insomnia  F51.01 traZODone (DESYREL) 100 MG tablet     QUEtiapine (SEROQUEL) 50 MG tablet    mirtazapine (REMERON) 45 MG tablet    DISCONTINUED: mirtazapine (REMERON) 45 MG tablet      Past Psychiatric History: Brief Psychotic Disorder, MDD, and Insomnia, and multiple hospitalizations (latest [facility] 09/2021), failed ECT (07/2021), Hospitalized for Lithium toxicity (10/2021), was with [facility] team (discontinued 10/2021).  Past Medical History:  Past Medical History:  Diagnosis Date   Anxiety    Asthma    Depression    History reviewed. No pertinent surgical history.  Family Psychiatric History: Mother- Bipolar Disorder  Family History:  Family History  Problem Relation Age of Onset   Bipolar disorder Mother     Social History:  Social History   Socioeconomic History   Marital status: Single    Spouse name: Not on file   Number of children: 1   Years of education: Not on file   Highest education level: GED or equivalent  Occupational History   Not on file  Tobacco Use   Smoking status: Never   Smokeless tobacco: Never  Vaping Use   Vaping Use: Unknown  Substance and Sexual Activity   Alcohol use: No   Drug use: No   Sexual activity: Not Currently  Other Topics Concern   Not on file  Social History Narrative   Marital status:  Single   Children: Daughter   Employment:  Unemployment. previous work for [employer].  Alcohol:  None   Drugs:  none   Exercise:  Regularly very   Religion:  Jewish   Social Determinants of Health   Financial Resource Strain: Medium Risk (10/02/2019)   Overall Financial Resource Strain (CARDIA)    Difficulty of Paying Living Expenses: Somewhat hard  Food Insecurity: No Food Insecurity (10/02/2019)   Hunger Vital Sign    Worried About Running Out of Food in the Last Year: Never true    Ran Out of Food in the Last Year: Never true  Transportation Needs: No Transportation Needs (10/02/2019)   PRAPARE - Hydrologist (Medical): No    Lack of Transportation  (Non-Medical): No  Physical Activity: Insufficiently Active (10/02/2019)   Exercise Vital Sign    Days of Exercise per Week: 2 days    Minutes of Exercise per Session: 30 min  Stress: Stress Concern Present (10/02/2019)   [employer]    Feeling of Stress : Rather much  Social Connections: Socially Isolated (10/02/2019)   Social Connection and Isolation Panel [NHANES]    Frequency of Communication with Friends and Family: Never    Frequency of Social Gatherings with Friends and Family: Never    Attends Religious Services: Never    Marine scientist or Organizations: No    Attends Archivist Meetings: Never    Marital Status: Divorced    Allergies:  Allergies  Allergen Reactions   Dust Mite Mixed Allergen Ext [Mite (D. Farinae)] Shortness Of Breath   Bee Pollen Other (See Comments)    Watery Eyes    Metabolic Disorder Labs: Lab Results  Component Value Date   HGBA1C 5.1 06/07/2021   MPG 99.67 06/07/2021   MPG 111.15 11/24/2020   No results found for: "PROLACTIN" Lab Results  Component Value Date   CHOL 197 06/07/2021   TRIG 149 06/07/2021   HDL 44 06/07/2021   CHOLHDL 4.5 06/07/2021   VLDL 30 06/07/2021   LDLCALC 123 (H) 06/07/2021   LDLCALC 148 (H) 11/24/2020   Lab Results  Component Value Date   TSH 2.314 10/20/2021   TSH 1.983 09/10/2021    Therapeutic Level Labs: Lab Results  Component Value Date   LITHIUM 0.23 (L) 10/23/2021   LITHIUM 0.72 10/21/2021   No results found for: "VALPROATE" No results found for: "CBMZ"  Current Medications: Current Outpatient Medications  Medication Sig Dispense Refill   QUEtiapine (SEROQUEL) 50 MG tablet Take 1 tablet (50 mg total) by mouth at bedtime. 30 tablet 0   amLODipine (NORVASC) 10 MG tablet Take 1 tablet (10 mg total) by mouth daily.     famotidine (PEPCID) 20 MG tablet Take 1 tablet (20 mg total) by mouth daily.     lithium carbonate  (LITHOBID) 300 MG CR tablet Take 1 tablet (300 mg total) by mouth every 12 (twelve) hours. 60 tablet 0   mirtazapine (REMERON) 45 MG tablet Take 1 tablet (45 mg total) by mouth at bedtime. 30 tablet 0   traZODone (DESYREL) 100 MG tablet Take 1 tablet (100 mg total) by mouth at bedtime. 30 tablet 0   vitamin B-12 1000 MCG tablet Take 1 tablet (1,000 mcg total) by mouth daily. 30 tablet 0   No current facility-administered medications for this visit.     Musculoskeletal: Strength & Muscle Tone: within normal limits Gait & Station: normal but slow Patient leans: N/A  Psychiatric Specialty Exam: Review of Systems  Respiratory:  Negative for shortness of breath.   Cardiovascular:  Negative for chest pain.  Gastrointestinal:  Negative for abdominal pain, constipation, diarrhea, nausea and vomiting.  Neurological:  Negative for dizziness, weakness and headaches.  Psychiatric/Behavioral:  Positive for confusion, dysphoric mood and sleep disturbance. Negative for self-injury and suicidal ideas. The patient is not nervous/anxious.     Blood pressure 118/86, pulse 85, height '5\' 8"'$  (1.727 m), weight 190 lb (86.2 kg), SpO2 99 %.Body mass index is 28.89 kg/m.  General Appearance: Casual hair/beard is combed but shirt is stained  Eye Contact:  Fair  Speech:  Clear and Coherent and Slow  Volume:  Normal  Mood:   "fine"  Affect:  Congruent  Thought Process:  Coherent and Goal Directed  Orientation:  Full (Time, Place, and Person)  Thought Content: Logical   Suicidal Thoughts:  No  Homicidal Thoughts:  No  Memory:  Immediate;   Fair  Judgement:  Fair  Insight:  Fair  Psychomotor Activity:  Normal  Concentration:  Concentration: Good and Attention Span: Good  Recall:  [place] of Knowledge: Fair  Language: Good  Akathisia:  Negative  Handed:  Right  AIMS (if indicated): done AIMS=1 mild tongue movement  Assets:  Desire for Improvement Housing Leisure Time Physical Health Resilience   ADL's:  Intact  Cognition: WNL  Sleep:  Poor   Screenings: AIMS    Flowsheet Row Admission (Discharged) from 06/07/2021 in [place] [facility] Admission (Discharged) from 11/23/2020 in [place] [facility]  AIMS Total Score 7 0      AUDIT    Flowsheet Row Admission (Discharged) from 09/13/2021 in [place] Admission (Discharged) from 06/23/2021 in [place] Admission (Discharged) from 06/07/2021 in [place] [facility] Admission (Discharged) from 11/23/2020 in [place] [facility] Admission (Discharged) from 09/24/2019 in [place] [facility]  Alcohol Use Disorder Identification Test Final Score (AUDIT) 0 0 0 0 0      ECT-MADRS    Flowsheet Row Admission (Discharged) from 06/23/2021 in [place] Total Score 44      GAD-7    Flowsheet Row Video Visit from 03/30/2021 in [place] from 12/28/2020 in [place] from 11/22/2020 in [facility] Video Visit from 09/20/2020 in [facility] Video Visit from 06/23/2020 in [facility]  Total GAD-7 Score '14 18 20 7 8      '$ Mini-Mental    Flowsheet Row Admission (Discharged) from 06/23/2021 in [place]  Total Score (max 30 points ) 30      PHQ2-9    [place] ED from 09/10/2021 in [facility] ED from 06/07/2021 in [place] DEPT Video Visit from 03/30/2021 in [place] from 12/28/2020 in [facility] ED from 11/22/2020 in [place]  PHQ-2 Total Score '6 6 5 6 6  '$ PHQ-9 Total Score  '26 19 20 24 27      '$ Flowsheet Row ED to Hosp-Admission (Discharged) from 10/20/2021 in [place] Admission (Discharged) from 09/13/2021 in [place] ED from 09/10/2021 in [place] No Risk No Risk Low Risk  Assessment and Plan:  Casey Silva is a 66 yr old male who presents for follow up care and medication management.  PPHx is significant for Brief Psychotic Disorder, MDD, and Insomnia, and multiple hospitalizations (latest [facility] 09/2021), failed ECT (07/2021), Hospitalized for Lithium toxicity (10/2021), was with [facility] (discontinued 10/2021).   [place] reports that he has been doing ok since his recent hospitalization and wanting to re-establish with Dr. Ronne Binning.  We will continue his medications as he had been discharged on from the hospital.  He reports his mood has been worse as well as his sleep since his Seroquel was stopped.  Due to the significant concern of destabilizing and previous strong SI we will restart his Seroquel at low dose.  He will have close follow up scheduled of 3 weeks with Dr. Bradley Ferris to monitor.    MDD, Recurrent, Moderate  GAD: -Continue Remeron 45 mg QHS. 30 tablets with 0 refills. -Continue Lithium 300 mg BID. 60 tablets with 0 refills. -Start Seroquel 50 mg QHS for Augmentation and Insomnia. 30 tablets with 0 refills.   Insomnia: -Continue Trazodone 100 mg QHS. 30 tablets with 0 refills -Start Seroquel 50 mg QHS for Augmentation and Insomnia. 30 tablets with 0 refills.    Collaboration of Care:   Patient/Guardian was advised Release of Information must be obtained prior to any record release in order to collaborate their care with an outside provider. Patient/Guardian was advised if they have not already done so to contact the registration department to sign all necessary forms in order for [place] to release information regarding  their care.   Consent: Patient/Guardian gives verbal consent for treatment and assignment of benefits for services provided during this visit. Patient/Guardian expressed understanding and agreed to proceed.    Briant Cedar, MD 11/03/2021, 11:56 AM

## 2021-11-07 ENCOUNTER — Other Ambulatory Visit: Payer: Self-pay

## 2021-12-06 ENCOUNTER — Encounter (HOSPITAL_COMMUNITY): Payer: Self-pay | Admitting: Psychiatry

## 2021-12-06 ENCOUNTER — Ambulatory Visit (INDEPENDENT_AMBULATORY_CARE_PROVIDER_SITE_OTHER): Payer: No Payment, Other | Admitting: Psychiatry

## 2021-12-06 ENCOUNTER — Other Ambulatory Visit: Payer: Self-pay

## 2021-12-06 DIAGNOSIS — F331 Major depressive disorder, recurrent, moderate: Secondary | ICD-10-CM | POA: Diagnosis not present

## 2021-12-06 DIAGNOSIS — F411 Generalized anxiety disorder: Secondary | ICD-10-CM | POA: Diagnosis not present

## 2021-12-06 DIAGNOSIS — F5101 Primary insomnia: Secondary | ICD-10-CM | POA: Diagnosis not present

## 2021-12-06 MED ORDER — MIRTAZAPINE 45 MG PO TABS
45.0000 mg | ORAL_TABLET | Freq: Every day | ORAL | 3 refills | Status: AC
  Filled 2021-12-06: qty 30, 30d supply, fill #0

## 2021-12-06 MED ORDER — LITHIUM CARBONATE ER 300 MG PO TBCR
300.0000 mg | EXTENDED_RELEASE_TABLET | Freq: Two times a day (BID) | ORAL | 3 refills | Status: AC
  Filled 2021-12-06: qty 60, 30d supply, fill #0

## 2021-12-06 MED ORDER — TRAZODONE HCL 100 MG PO TABS
100.0000 mg | ORAL_TABLET | Freq: Every day | ORAL | 3 refills | Status: AC
  Filled 2021-12-06: qty 30, 30d supply, fill #0

## 2021-12-06 MED ORDER — QUETIAPINE FUMARATE 50 MG PO TABS
50.0000 mg | ORAL_TABLET | Freq: Every day | ORAL | 3 refills | Status: AC
  Filled 2021-12-06: qty 30, 30d supply, fill #0

## 2021-12-06 NOTE — Progress Notes (Signed)
BH MD/PA/NP OP Progress Note  12/06/2021 3:30 PM Casey Silva  MRN:  [id number]  Chief Complaint: I feel in the middle Chief Complaint  Patient presents with   Medication Management   HPI:  A 66 year old male seen today for follow up psychiatric evaluation. He has a psychiatric history of brief psychotic disorder, MDD, and insomnia. He is currently on hydroxyzine 25 lithium 300 mg twice daily, mirtazapine 45 mg nightly, Seroquel 50 mg nightly, and trazodone 100 mg nightly.  He informed Probation officer that his medications are effective in managing his psychiatric conditions.    Today patient is fairly groomed, pleasant, cooperative, and engaged in conversation.  He informed Probation officer that physically he is not doing well but notes that mentally he is in a better place.  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  He notes that he spends most of his days at home.  He describes his mood as being in the middle.  He reports that nothing is great but nothing is worse.  Provider conducted a GAD-7 and patient scored a 2.  Provider also conducted PHQ-9 and patient scored a 10.  He endorses adequate sleep and appetite.  Today he denies SI/HI/AH, mania, or paranoia.  Patient informed writer that at times he believes that someone is riding with him.  He notes that he sees figures at the corner of his eyes.  He informed Probation officer however that he is able to cope with it and request that medications not be adjusted.    Patient informed Probation officer that he has been forgetful and disorganized.  He also notes at times he misuses words.  Today he was alert and oriented x4.  Today provider conducted an aims assessment and patient scored 0.    Patient informed Probation officer that he had an act team however notes that he felt that they were useless.  He notes that he went into lithium toxicity after being given lithium by his provider.  He notes that he found his provider uncaring and not empathetic.  Provider encouraged  patient to stay hydrated with lithium.  Overall patient notes that he is doing well.  No medication changes made today.  Patient agreeable to continue medication as prescribed.  No other concerns at this time.  Visit Diagnosis:    ICD-10-CM   1. Moderate episode of recurrent major depressive disorder (HCC)  F33.1 lithium carbonate (LITHOBID) 300 MG CR tablet    mirtazapine (REMERON) 45 MG tablet    QUEtiapine (SEROQUEL) 50 MG tablet    2. GAD (generalized anxiety disorder)  F41.1 mirtazapine (REMERON) 45 MG tablet    QUEtiapine (SEROQUEL) 50 MG tablet    3. Primary insomnia  F51.01 mirtazapine (REMERON) 45 MG tablet    QUEtiapine (SEROQUEL) 50 MG tablet    traZODone (DESYREL) 100 MG tablet      Past Psychiatric History: brief psychotic disorder, MDD, and insomnia  Past Medical History:  Past Medical History:  Diagnosis Date   Anxiety    Asthma    Depression    History reviewed. No pertinent surgical history.  Family Psychiatric History: Mother bipolar depression   Family History:  Family History  Problem Relation Age of Onset   Bipolar disorder Mother     Social History:  Social History   Socioeconomic History   Marital status: Single    Spouse name: Not on file   Number of children: 1   Years of education: Not on file  Highest education level: GED or equivalent  Occupational History   Not on file  Tobacco Use   Smoking status: Never   Smokeless tobacco: Never  Vaping Use   Vaping Use: Unknown  Substance and Sexual Activity   Alcohol use: No   Drug use: No   Sexual activity: Not Currently  Other Topics Concern   Not on file  Social History Narrative   Marital status:  Single   Children: Daughter   Employment:  Unemployment. previous work for [employer].   Alcohol:  None   Drugs:  none   Exercise:  Regularly very   Religion:  Jewish   Social Determinants of Health   Financial Resource Strain: Medium Risk (10/02/2019)   Overall Financial Resource  Strain (CARDIA)    Difficulty of Paying Living Expenses: Somewhat hard  Food Insecurity: No Food Insecurity (10/02/2019)   Hunger Vital Sign    Worried About Running Out of Food in the Last Year: Never true    Ran Out of Food in the Last Year: Never true  Transportation Needs: No Transportation Needs (10/02/2019)   PRAPARE - Hydrologist (Medical): No    Lack of Transportation (Non-Medical): No  Physical Activity: Insufficiently Active (10/02/2019)   Exercise Vital Sign    Days of Exercise per Week: 2 days    Minutes of Exercise per Session: 30 min  Stress: Stress Concern Present (10/02/2019)   [place]    Feeling of Stress : Rather much  Social Connections: Socially Isolated (10/02/2019)   Social Connection and Isolation Panel [NHANES]    Frequency of Communication with Friends and Family: Never    Frequency of Social Gatherings with Friends and Family: Never    Attends Religious Services: Never    Marine scientist or Organizations: No    Attends Archivist Meetings: Never    Marital Status: Divorced    Allergies:  Allergies  Allergen Reactions   Dust Mite Mixed Allergen Ext [Mite (D. Farinae)] Shortness Of Breath   Bee Pollen Other (See Comments)    Watery Eyes    Metabolic Disorder Labs: Lab Results  Component Value Date   HGBA1C 5.1 06/07/2021   MPG 99.67 06/07/2021   MPG 111.15 11/24/2020   No results found for: "PROLACTIN" Lab Results  Component Value Date   CHOL 197 06/07/2021   TRIG 149 06/07/2021   HDL 44 06/07/2021   CHOLHDL 4.5 06/07/2021   VLDL 30 06/07/2021   LDLCALC 123 (H) 06/07/2021   LDLCALC 148 (H) 11/24/2020   Lab Results  Component Value Date   TSH 2.314 10/20/2021   TSH 1.983 09/10/2021    Therapeutic Level Labs: Lab Results  Component Value Date   LITHIUM 0.23 (L) 10/23/2021   LITHIUM 0.72 10/21/2021   No results found for:  "VALPROATE" No results found for: "CBMZ"  Current Medications: Current Outpatient Medications  Medication Sig Dispense Refill   amLODipine (NORVASC) 10 MG tablet Take 1 tablet (10 mg total) by mouth daily.     famotidine (PEPCID) 20 MG tablet Take 1 tablet (20 mg total) by mouth daily.     vitamin B-12 1000 MCG tablet Take 1 tablet (1,000 mcg total) by mouth daily. 30 tablet 0   lithium carbonate (LITHOBID) 300 MG CR tablet Take 1 tablet (300 mg total) by mouth every 12 (twelve) hours. 60 tablet 3   mirtazapine (REMERON) 45 MG tablet Take  1 tablet (45 mg total) by mouth at bedtime. 30 tablet 3   QUEtiapine (SEROQUEL) 50 MG tablet Take 1 tablet (50 mg total) by mouth at bedtime. 30 tablet 3   traZODone (DESYREL) 100 MG tablet Take 1 tablet (100 mg total) by mouth at bedtime. 30 tablet 3   No current facility-administered medications for this visit.     Musculoskeletal: Strength & Muscle Tone: within normal limits Gait & Station: normal Patient leans: N/A  Psychiatric Specialty Exam: Review of Systems  Blood pressure (!) 141/85, pulse 88, height '5\' 8"'$  (1.727 m).Body mass index is 28.89 kg/m.  General Appearance: Fairly Groomed  Eye Contact:  Good  Speech:  Clear and Coherent and Normal Rate  Volume:  Normal  Mood:  Anxious, Depressed, and reports can cope with it  Affect:  Appropriate and Congruent  Thought Process:  Coherent, Goal Directed, and Linear  Orientation:  Full (Time, Place, and Person)  Thought Content: Logical and Hallucinations: Visual   Suicidal Thoughts:  No  Homicidal Thoughts:  No  Memory:  Immediate;   Fair Recent;   Fair Remote;   Fair  Judgement:  Good  Insight:  Good  Psychomotor Activity:  Normal  Concentration:  Concentration: Fair and Attention Span: Fair  Recall:  [place] of Knowledge: Good  Language: Good  Akathisia:  No  Handed:  Right  AIMS (if indicated): done, 1  Assets:  Communication Skills Desire for Improvement Financial  Resources/Insurance Housing Leisure Time Social Support  ADL's:  Intact  Cognition: WNL  Sleep:  Good   Screenings: AIMS    Flowsheet Row Clinical Support from 12/06/2021 in [facility] Admission (Discharged) from 06/07/2021 in [place] [facility] Admission (Discharged) from 11/23/2020 in [place] [facility]  AIMS Total Score 0 7 0      AUDIT    Flowsheet Row Admission (Discharged) from 09/13/2021 in [place] Admission (Discharged) from 06/23/2021 in [place] Admission (Discharged) from 06/07/2021 in [place] [facility] Admission (Discharged) from 11/23/2020 in [place] [facility] Admission (Discharged) from 09/24/2019 in [place] [facility]  Alcohol Use Disorder Identification Test Final Score (AUDIT) 0 0 0 0 0      ECT-MADRS    Flowsheet Row Admission (Discharged) from 06/23/2021 in [place] Total Score 44      GAD-7    Flowsheet Row Clinical Support from 12/06/2021 in [facility] Video Visit from 03/30/2021 in [place] from 12/28/2020 in [place] from 11/22/2020 in [facility] Video Visit from 09/20/2020 in [facility]  Total GAD-7 Score '2 14 18 20 7      '$ Mini-Mental    [place] Admission (Discharged) from 06/23/2021 in [place]  Total Score (max 30 points ) 30      PHQ2-9    Flowsheet Row Clinical Support from 12/06/2021 in [facility] ED from 09/10/2021 in [facility] ED from 06/07/2021 in [place] DEPT Video Visit from 03/30/2021 in [place] from 12/28/2020 in [facility]  PHQ-2 Total Score '6 6 6 5 6  '$ PHQ-9 Total Score '7 26 19 20 24      '$ Flowsheet  Row ED to Hosp-Admission (Discharged) from 10/20/2021 in [place] Admission (Discharged) from 09/13/2021 in [place] ED from 09/10/2021 in [place] No Risk No Risk Low Risk        Assessment and Plan: Patient does endorse symptoms of anxiety and depression however reports that they are manageable.  At times she notes that he has visual hallucination but reports that he is able to cope with it.  No medication changes made today.  Patient agreeable to continue medication as prescribed.  1. Moderate episode of recurrent major depressive disorder (HCC)  Continue- lithium carbonate (LITHOBID) 300 MG CR tablet; Take 1 tablet (300 mg total) by mouth every 12 (twelve) hours.  Dispense: 60 tablet; Refill: 3 Continue- mirtazapine (REMERON) 45 MG tablet; Take 1 tablet (45 mg total) by mouth at bedtime.  Dispense: 30 tablet; Refill: 3 Continue- QUEtiapine (SEROQUEL) 50 MG tablet; Take 1 tablet (50 mg total) by mouth at bedtime.  Dispense: 30 tablet; Refill: 3  2. GAD (generalized anxiety disorder)  Continue- mirtazapine (REMERON) 45 MG tablet; Take 1 tablet (45 mg total) by mouth at bedtime.  Dispense: 30 tablet; Refill: 3 Continue- QUEtiapine (SEROQUEL) 50 MG tablet; Take 1 tablet (50 mg total) by mouth at bedtime.  Dispense: 30 tablet; Refill: 3  3. Primary insomnia  Continue- mirtazapine (REMERON) 45 MG tablet; Take 1 tablet (45 mg total) by mouth at bedtime.  Dispense: 30 tablet; Refill: 3 Continue- QUEtiapine (SEROQUEL) 50 MG tablet; Take 1 tablet (50 mg total) by mouth at bedtime.  Dispense: 30 tablet; Refill: 3 Continue- traZODone (DESYREL) 100 MG tablet; Take 1 tablet (100 mg total) by mouth at  bedtime.  Dispense: 30 tablet; Refill: 3   Collaboration of Care: Collaboration of Care: Other provider involved in patient's care AEB    Patient/Guardian was advised Release of Information must be obtained prior to any record release in order to collaborate their care with an outside provider. Patient/Guardian was advised if they have not already done so to contact the registration department to sign all necessary forms in order for [place] to release information regarding their care.   Consent: Patient/Guardian gives verbal consent for treatment and assignment of benefits for services provided during this visit. Patient/Guardian expressed understanding and agreed to proceed.   Follow up in 3 months Salley Slaughter, NP 12/06/2021, 3:30 PM

## 2022-03-01 ENCOUNTER — Encounter (HOSPITAL_COMMUNITY): Payer: Self-pay

## 2022-03-01 ENCOUNTER — Encounter (HOSPITAL_COMMUNITY): Payer: No Payment, Other | Admitting: Psychiatry

## 2022-03-08 ENCOUNTER — Emergency Department (HOSPITAL_COMMUNITY)
Admission: EM | Admit: 2022-03-08 | Discharge: 2022-03-08 | Disposition: A | Discharge status: Home / Self Care | Payer: Medicare Other | Attending: Emergency Medicine | Admitting: Emergency Medicine

## 2022-03-08 ENCOUNTER — Other Ambulatory Visit: Payer: Self-pay

## 2022-03-08 DIAGNOSIS — R058 Other specified cough: Secondary | ICD-10-CM | POA: Insufficient documentation

## 2022-03-08 DIAGNOSIS — R519 Headache, unspecified: Secondary | ICD-10-CM | POA: Insufficient documentation

## 2022-03-08 DIAGNOSIS — Z1152 Encounter for screening for COVID-19: Secondary | ICD-10-CM | POA: Insufficient documentation

## 2022-03-08 LAB — CBC WITH DIFFERENTIAL/PLATELET
Abs Immature Granulocytes: 0.02 10*3/uL (ref 0.00–0.07)
Basophils Absolute: 0.1 10*3/uL (ref 0.0–0.1)
Basophils Relative: 1 %
Eosinophils Absolute: 0.2 10*3/uL (ref 0.0–0.5)
Eosinophils Relative: 3 %
HCT: 45.2 % (ref 39.0–52.0)
Hemoglobin: 14.9 g/dL (ref 13.0–17.0)
Immature Granulocytes: 0 %
Lymphocytes Relative: 29 %
Lymphs Abs: 2 10*3/uL (ref 0.7–4.0)
MCH: 28.4 pg (ref 26.0–34.0)
MCHC: 33 g/dL (ref 30.0–36.0)
MCV: 86.3 fL (ref 80.0–100.0)
Monocytes Absolute: 0.4 10*3/uL (ref 0.1–1.0)
Monocytes Relative: 6 %
Neutro Abs: 4.1 10*3/uL (ref 1.7–7.7)
Neutrophils Relative %: 61 %
Platelets: 309 10*3/uL (ref 150–400)
RBC: 5.24 MIL/uL (ref 4.22–5.81)
RDW: 14.2 % (ref 11.5–15.5)
WBC: 6.8 10*3/uL (ref 4.0–10.5)
nRBC: 0 % (ref 0.0–0.2)

## 2022-03-08 LAB — BASIC METABOLIC PANEL
Anion gap: 9 (ref 5–15)
BUN: 9 mg/dL (ref 8–23)
CO2: 23 mmol/L (ref 22–32)
Calcium: 9.1 mg/dL (ref 8.9–10.3)
Chloride: 106 mmol/L (ref 98–111)
Creatinine, Ser: 1.01 mg/dL (ref 0.61–1.24)
GFR, Estimated: >60 mL/min (ref >60)
Glucose, Bld: 100 mg/dL — ABNORMAL HIGH (ref 70–99)
Potassium: 3.9 mmol/L (ref 3.5–5.1)
Sodium: 138 mmol/L (ref 135–145)

## 2022-03-08 LAB — RESP PANEL BY RT-PCR (FLU A&B, COVID) ARPGX2
Influenza A by PCR: NEGATIVE
Influenza B by PCR: NEGATIVE
SARS Coronavirus 2 by RT PCR: NEGATIVE

## 2022-03-08 MED ORDER — FAMOTIDINE 20 MG PO TABS: 20.0000 mg | ORAL_TABLET | Freq: Two times a day (BID) | ORAL | 0 refills | Status: DC

## 2022-03-08 MED ORDER — FAMOTIDINE 20 MG PO TABS: 20.0000 mg | ORAL_TABLET | Freq: Two times a day (BID) | ORAL | 0 refills | Status: AC

## 2022-03-08 NOTE — Discharge Instructions (Addendum)
You were evaluated today for sinus pain, headache, and abdominal pain.  Your sinus symptoms seem consistent with a viral infection.  Please use over-the-counter medication for symptom control.  Your abdominal pain seems consistent with gastritis or inflammation of the lining of your stomach.  I have prescribed Pepcid which is an acid medication.  Please take this as prescribed.  Please follow-up with your primary care provider.

## 2022-03-08 NOTE — ED Provider Notes (Signed)
[facility] DEPT Provider Note   CSN: [id number] Arrival date & time: 03/08/22  1222     History  Chief Complaint  Patient presents with   Headache    Casey Silva is a 66 y.o. male.  Patient presents to the emergency department complaining of approximately 1 week to 10 days of head pressure described as a sinus infection, nausea, and upset stomach.  Patient denies vomiting, diarrhea, constipation.  He states that his headache/pressure has improved somewhat but he still has a right-sided headache.  He states that he has been trying to eat things and that things other than soup upset his stomach.  He is denying shortness of breath, chest pain, cough, urinary symptoms at this time.  Past medical history significant for generalized anxiety disorder, major depressive disorder, asthma, hypertension, chronic neck pain, pulmonary emphysema  HPI     Home Medications Prior to Admission medications   Medication Sig Start Date End Date Taking? Authorizing Provider  amLODipine (NORVASC) 10 MG tablet Take 1 tablet (10 mg total) by mouth daily. 09/14/21   Ival Bible, MD  famotidine (PEPCID) 20 MG tablet Take 1 tablet (20 mg total) by mouth 2 (two) times daily. 03/08/22   Dorothyann Peng, PA-C  lithium carbonate (LITHOBID) 300 MG CR tablet Take 1 tablet (300 mg total) by mouth every 12 (twelve) hours. 12/06/21   Salley Slaughter, NP  mirtazapine (REMERON) 45 MG tablet Take 1 tablet (45 mg total) by mouth at bedtime. 12/06/21   Salley Slaughter, NP  QUEtiapine (SEROQUEL) 50 MG tablet Take 1 tablet (50 mg total) by mouth at bedtime. 12/06/21   Salley Slaughter, NP  traZODone (DESYREL) 100 MG tablet Take 1 tablet (100 mg total) by mouth at bedtime. 12/06/21   Salley Slaughter, NP  vitamin B-12 1000 MCG tablet Take 1 tablet (1,000 mcg total) by mouth daily. 10/25/21   Aline August, MD      Allergies    Dust mite mixed allergen ext [mite (d. farinae)]  and Bee pollen    Review of Systems   Review of Systems  Constitutional:  Negative for fever.  Respiratory:  Positive for cough. Negative for shortness of breath.   Cardiovascular:  Negative for chest pain.  Gastrointestinal:  Positive for abdominal pain and nausea. Negative for vomiting.  Genitourinary:  Negative for dysuria.    Physical Exam Updated Vital Signs BP (!) 175/107 (BP Location: Left Arm)   Pulse 80   Temp 98.3 F (36.8 C) (Oral)   Resp 18   Ht '5\' 8"'$  (1.727 m)   Wt 86.2 kg   SpO2 95%   BMI 28.89 kg/m  Physical Exam Vitals and nursing note reviewed.  Constitutional:      General: He is not in acute distress.    Appearance: He is well-developed.  HENT:     Head: Normocephalic and atraumatic.  Eyes:     Extraocular Movements: Extraocular movements intact.     Conjunctiva/sclera: Conjunctivae normal.  Cardiovascular:     Rate and Rhythm: Normal rate and regular rhythm.     Heart sounds: No murmur heard. Pulmonary:     Effort: Pulmonary effort is normal. No respiratory distress.     Breath sounds: Normal breath sounds.  Abdominal:     Palpations: Abdomen is soft.     Tenderness: There is no abdominal tenderness.  Musculoskeletal:        General: No swelling.     Cervical back:  Neck supple.  Skin:    General: Skin is warm and dry.     Capillary Refill: Capillary refill takes less than 2 seconds.  Neurological:     Mental Status: He is alert.  Psychiatric:        Mood and Affect: Mood normal.     ED Results / Procedures / Treatments   Labs (all labs ordered are listed, but only abnormal results are displayed) Labs Reviewed  BASIC METABOLIC PANEL - Abnormal; Notable for the following components:      Result Value   Glucose, Bld 100 (*)    All other components within normal limits  RESP PANEL BY RT-PCR (FLU A&B, COVID) ARPGX2  CBC WITH DIFFERENTIAL/PLATELET    EKG None  Radiology No results found.  Procedures Procedures    Medications  Ordered in ED Medications - No data to display  ED Course/ Medical Decision Making/ A&P                           Medical Decision Making Amount and/or Complexity of Data Reviewed Labs: ordered.   Patient presents with a chief complaint of headache with sinus pressure.  Patient also complains of epigastric pain with food intake.  I ordered and interpreted labs.  Pertinent results include negative respiratory panel, unremarkable CBC, unremarkable BMP.  Normal potassium, normal hemoglobin, normal white count  The patient's lungs are clear to auscultation bilaterally the patient complains of no shortness of breath.  I see no indication at this time for chest imaging  Patient's symptoms are consistent with a viral illness and gastritis.  Patient's symptoms have not been ongoing or enough to treat for bacterial sinusitis.  Feel this is still likely viral.  Patient is able to eat but does state that certain foods seem to irritate his stomach. No focal tenderness to suggest cholecystitis, pancreatitis, appendicitis.  Plan to prescribe the patient a short course of Pepcid and have him follow-up with primary care.        Final Clinical Impression(s) / ED Diagnoses Final diagnoses:  Sinus headache    Rx / DC Orders ED Discharge Orders          Ordered    famotidine (PEPCID) 20 MG tablet  2 times daily,   Status:  Discontinued        03/08/22 1531    famotidine (PEPCID) 20 MG tablet  2 times daily        03/08/22 1538              Ronny Bacon 03/08/22 1551    Regan Lemming, MD 03/08/22 1909

## 2022-03-08 NOTE — ED Triage Notes (Signed)
Pt reports "head pressure, like a sinus infection", nausea and "turning feeling in my stomach" Pt states has not been able to eat.

## 2022-03-08 NOTE — ED Provider Triage Note (Signed)
Emergency Medicine Provider Triage Evaluation Note  Brevan Luberto , a 66 y.o. male  was evaluated in triage.  Pt complains of headache, sinus pressure, stomach upset when eating for the past week to 10 days.  He states has been taking DayQuil and acetaminophen at home with some relief.  He states that his headache and sinus pressure has improved somewhat but he still has headaches and states that when he eats things other than soup he begins to have stomach pain.  Denies chest pain, shortness of breath, cough at this time.  Review of Systems  Positive: As above Negative: As above  Physical Exam  BP (!) 175/107 (BP Location: Left Arm)   Pulse 80   Temp 98.3 F (36.8 C) (Oral)   Resp 18   Ht '5\' 8"'$  (1.727 m)   Wt 86.2 kg   SpO2 95%   BMI 28.89 kg/m  Gen:   Awake, no distress   Resp:  Normal effort  MSK:   Moves extremities without difficulty  Other:    Medical Decision Making  Medically screening exam initiated at 12:39 PM.  Appropriate orders placed.  Kedarius Aloisi was informed that the remainder of the evaluation will be completed by another provider, this initial triage assessment does not replace that evaluation, and the importance of remaining in the ED until their evaluation is complete.     Dorothyann Peng, PA-C 03/08/22 1240

## 2022-03-21 IMAGING — CT CT HEAD W/O CM
3 series · 16 of 47 positions shown, 19 images · non-contrast
Comparison: None.

CLINICAL DATA: 63-year-old male with history of encephalopathy.
Recent fall with head trauma. New auditory and visual
hallucinations.

EXAM:
CT HEAD WITHOUT CONTRAST
TECHNIQUE: Contiguous axial images were obtained from the base of the skull
through the vertex without intravenous contrast.

[Series 2: head wo · axial · 0.47mm/px · z∈[+1462,+1602]mm · 10 of 34 slices shown, 13 images]
[im 3/34  brain]
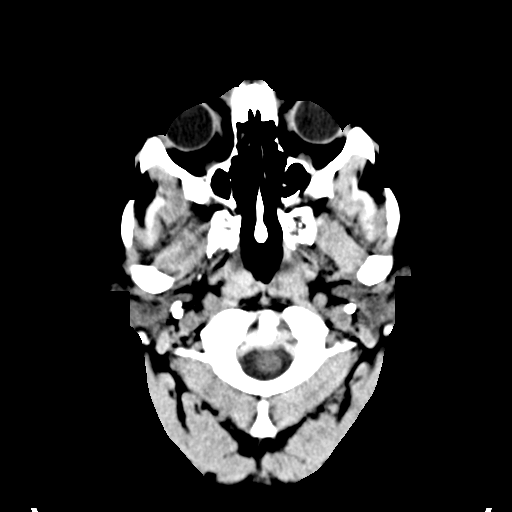
[im 3/34  bone]
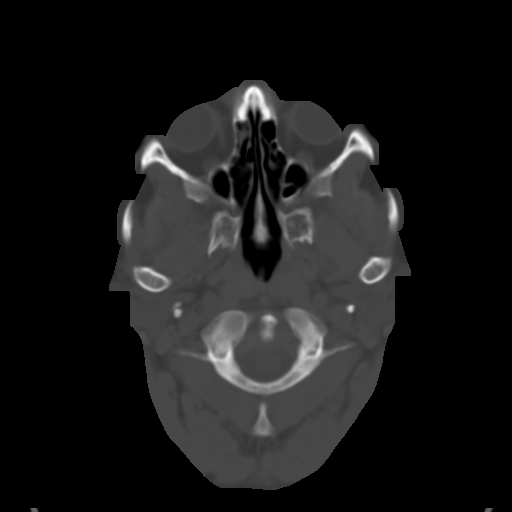
[im 6/34  brain]
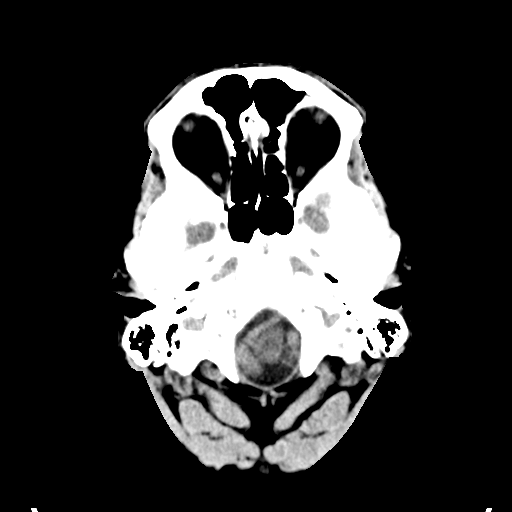
[im 10/34  brain]
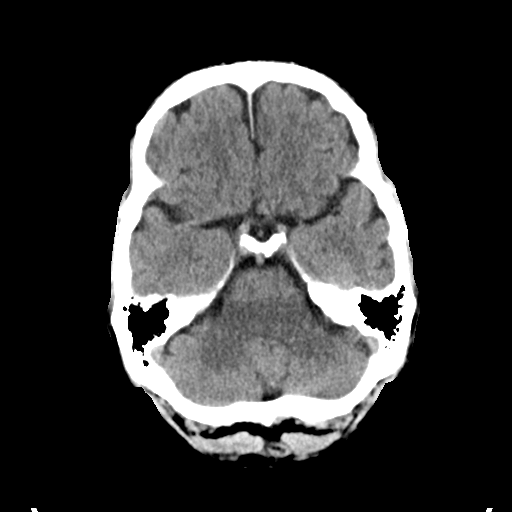
[im 12/34  brain]
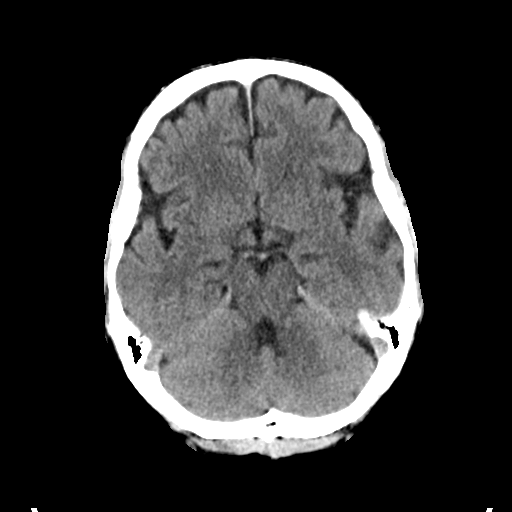
[im 15/34  brain]
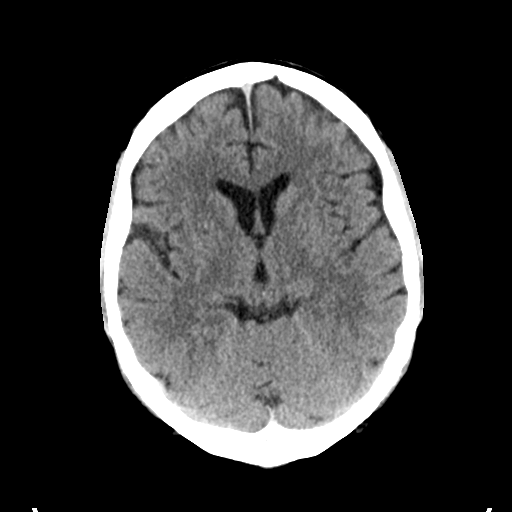
[im 15/34  bone]
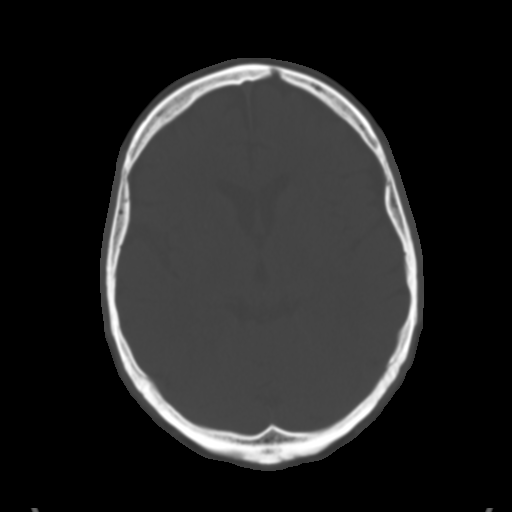
[im 19/34  brain]
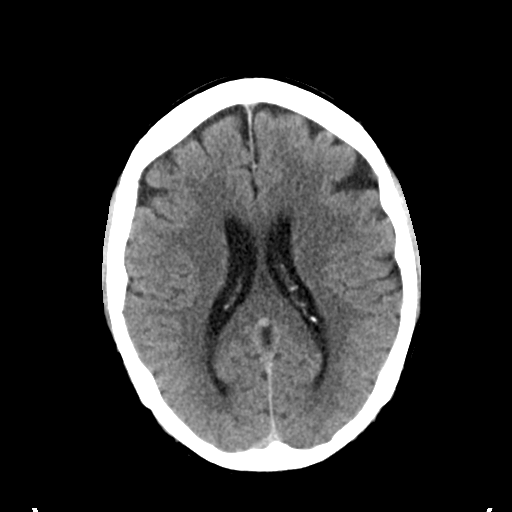
[im 22/34  brain]
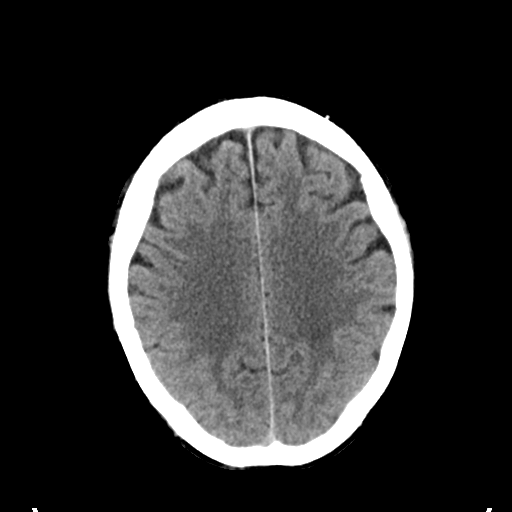
[im 26/34  brain]
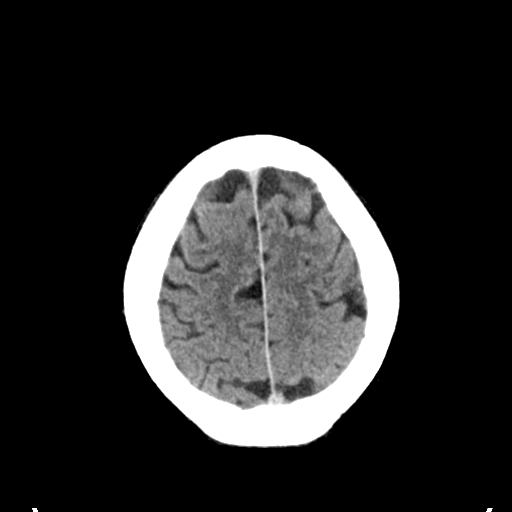
[im 28/34  brain]
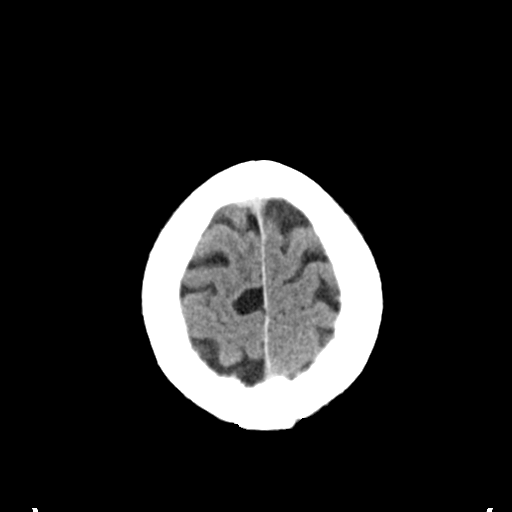
[im 28/34  bone]
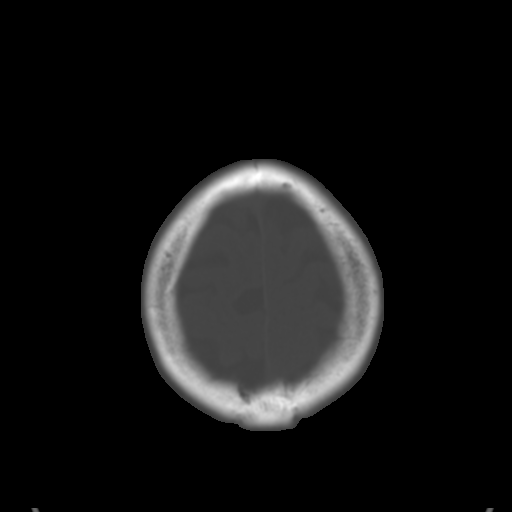
[im 31/34  brain]
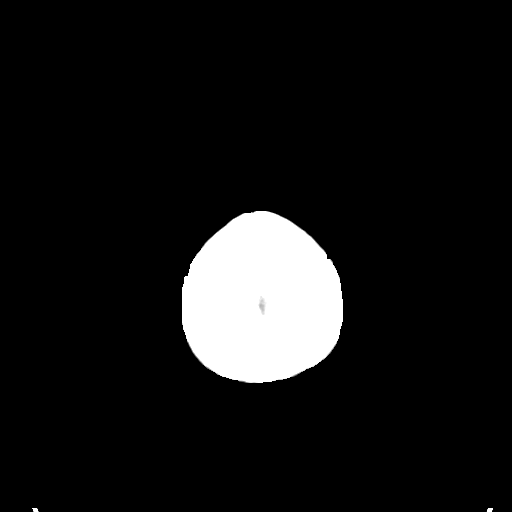

[Series 4: coronal soft tissue · coronal · 0.36mm/px · 3 of 74 slices shown]
[im 25/74  brain]
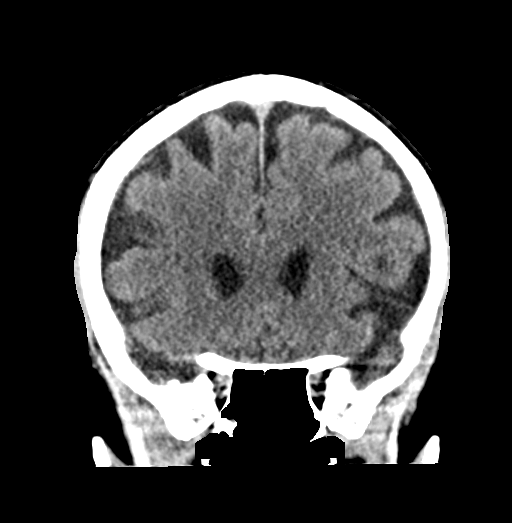
[im 33/74  brain]
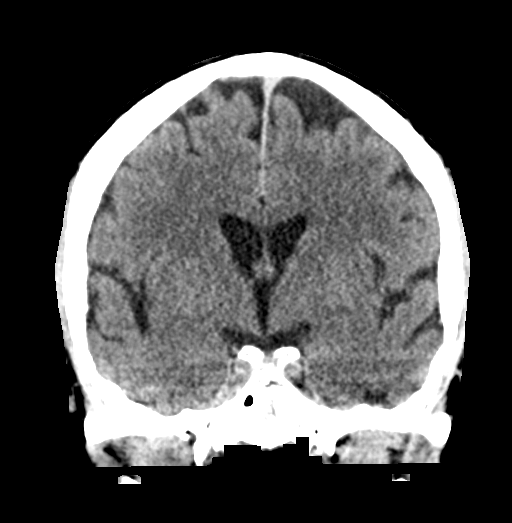
[im 41/74  brain]
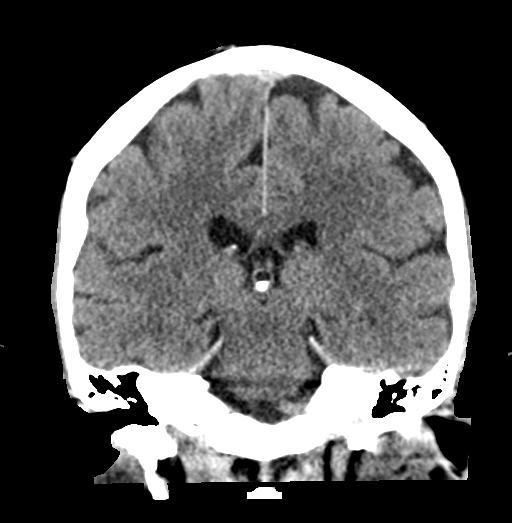

[Series 5: sagittal soft tissue · sagittal · 0.35mm/px · 3 of 59 slices shown]
[im 20/59  brain]
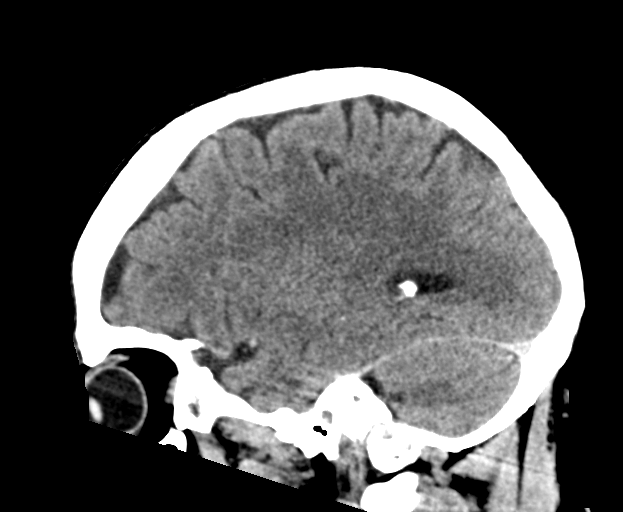
[im 30/59  brain]
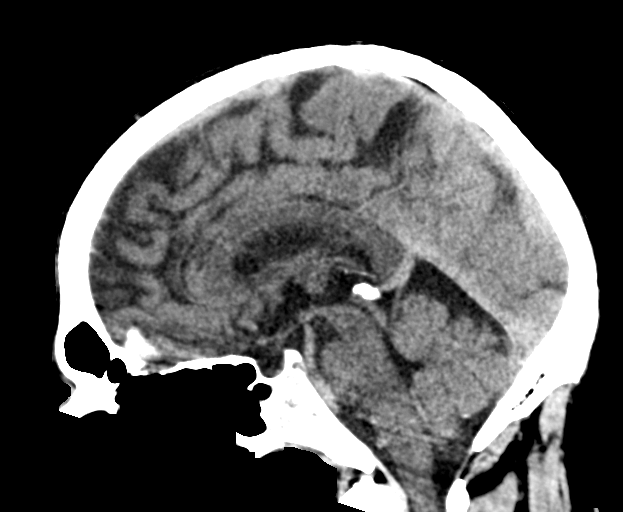
[im 39/59  brain]
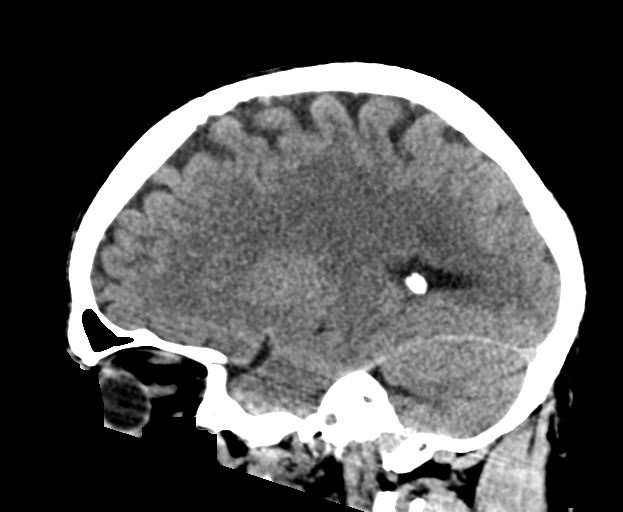

[16 of 47 positions shown; findings below may reference images not displayed]

FINDINGS: Brain: No evidence of acute infarction, hemorrhage, hydrocephalus,
extra-axial collection or mass lesion/mass effect.

Vascular: No hyperdense vessel or unexpected calcification.

Skull: Normal. Negative for fracture or focal lesion.

Sinuses/Orbits: No acute finding.

Other: None.
IMPRESSION: No acute intracranial abnormalities. The appearance of the brain is
normal.

## 2023-05-16 ENCOUNTER — Other Ambulatory Visit: Payer: Self-pay

## 2023-05-16 ENCOUNTER — Encounter (HOSPITAL_COMMUNITY): Payer: Self-pay | Admitting: *Deleted

## 2023-05-16 ENCOUNTER — Emergency Department (HOSPITAL_COMMUNITY)
Admission: EM | Admit: 2023-05-16 | Discharge: 2023-05-16 | Discharge status: Against Medical Advice | Payer: 59 | Attending: Emergency Medicine | Admitting: Emergency Medicine

## 2023-05-16 DIAGNOSIS — M549 Dorsalgia, unspecified: Secondary | ICD-10-CM | POA: Diagnosis present

## 2023-05-16 DIAGNOSIS — Z5321 Procedure and treatment not carried out due to patient leaving prior to being seen by health care provider: Secondary | ICD-10-CM | POA: Insufficient documentation

## 2023-05-16 HISTORY — DX: Essential (primary) hypertension: I10

## 2023-05-16 NOTE — ED Triage Notes (Signed)
 The pt reports that he has chronic back pain and the pt has had this episode for 2-3 weeks of pain

## 2023-05-16 NOTE — ED Provider Triage Note (Signed)
 Emergency Medicine Provider Triage Evaluation Note  Casey Silva , a 68 y.o. male  was evaluated in triage.  Pt complains of back pain, history of herniated disc on the left reports worse than normal.  He endorses some weight gain over the the holiday season.  No improvement with home medications, PT.  Denies any new numbness or tingling in groin.  Review of Systems  Positive: Back pain Negative: Numbness, tingling  Physical Exam  BP 135/84   Pulse 64   Temp 98.1 F (36.7 C) (Oral)   Resp 18   Ht 5' 8 (1.727 m)   Wt 86.2 kg   SpO2 97%   BMI 28.89 kg/m  Gen:   Awake, no distress   Resp:  Normal effort  MSK:   Moves extremities without difficulty  Other:  Ttp in left lumbar paraspinous region  Medical Decision Making  Medically screening exam initiated at 4:14 PM.  Appropriate orders placed.  Casey Silva was informed that the remainder of the evaluation will be completed by another provider, this initial triage assessment does not replace that evaluation, and the importance of remaining in the ED until their evaluation is complete.  Workup initiated in triage    Rosan Sherlean [place], [place] 05/16/23 1615
